# Patient Record
Sex: Female | Born: 1960
Health system: Southern US, Community
[De-identification: ages and names within clinical notes are randomized; demographics above are authoritative.]

## PROBLEM LIST (undated history)

## (undated) ENCOUNTER — Emergency Department (HOSPITAL_COMMUNITY): Discharge status: Absent without leave | Payer: Self-pay

## (undated) DIAGNOSIS — R7303 Prediabetes: Secondary | ICD-10-CM

## (undated) DIAGNOSIS — K429 Umbilical hernia without obstruction or gangrene: Secondary | ICD-10-CM

## (undated) DIAGNOSIS — C189 Malignant neoplasm of colon, unspecified: Secondary | ICD-10-CM

## (undated) DIAGNOSIS — M1990 Unspecified osteoarthritis, unspecified site: Secondary | ICD-10-CM

## (undated) DIAGNOSIS — I10 Essential (primary) hypertension: Secondary | ICD-10-CM

## (undated) DIAGNOSIS — T7840XA Allergy, unspecified, initial encounter: Secondary | ICD-10-CM

## (undated) DIAGNOSIS — K219 Gastro-esophageal reflux disease without esophagitis: Secondary | ICD-10-CM

## (undated) DIAGNOSIS — F32A Depression, unspecified: Secondary | ICD-10-CM

## (undated) DIAGNOSIS — M2040 Other hammer toe(s) (acquired), unspecified foot: Secondary | ICD-10-CM

## (undated) DIAGNOSIS — J189 Pneumonia, unspecified organism: Secondary | ICD-10-CM

## (undated) DIAGNOSIS — G35 Multiple sclerosis: Secondary | ICD-10-CM

## (undated) DIAGNOSIS — R197 Diarrhea, unspecified: Secondary | ICD-10-CM

## (undated) DIAGNOSIS — F329 Major depressive disorder, single episode, unspecified: Secondary | ICD-10-CM

## (undated) DIAGNOSIS — G4700 Insomnia, unspecified: Secondary | ICD-10-CM

## (undated) DIAGNOSIS — M549 Dorsalgia, unspecified: Secondary | ICD-10-CM

## (undated) HISTORY — DX: Depression, unspecified: F32.A

## (undated) HISTORY — PX: GASTRIC BYPASS: SHX52

## (undated) HISTORY — DX: Allergy, unspecified, initial encounter: T78.40XA

## (undated) HISTORY — DX: Other hammer toe(s) (acquired), unspecified foot: M20.40

## (undated) HISTORY — PX: COLON SURGERY: SHX602

## (undated) HISTORY — PX: COLONOSCOPY: SHX174

## (undated) HISTORY — PX: FOOT SURGERY: SHX648

## (undated) HISTORY — PX: ABDOMINAL HYSTERECTOMY: SHX81

## (undated) HISTORY — DX: Major depressive disorder, single episode, unspecified: F32.9

---

## 1997-03-19 DIAGNOSIS — Z85038 Personal history of other malignant neoplasm of large intestine: Secondary | ICD-10-CM | POA: Insufficient documentation

## 1997-07-07 ENCOUNTER — Ambulatory Visit (HOSPITAL_COMMUNITY): Admission: RE | Admit: 1997-07-07 | Discharge: 1997-07-07 | Payer: Self-pay | Admitting: Oncology

## 1997-07-26 ENCOUNTER — Ambulatory Visit (HOSPITAL_COMMUNITY): Admission: RE | Admit: 1997-07-26 | Discharge: 1997-07-26 | Payer: Self-pay | Admitting: Surgery

## 1997-08-24 ENCOUNTER — Other Ambulatory Visit: Admission: RE | Admit: 1997-08-24 | Discharge: 1997-08-24 | Payer: Self-pay | Admitting: Hematology and Oncology

## 1997-08-31 ENCOUNTER — Other Ambulatory Visit: Admission: RE | Admit: 1997-08-31 | Discharge: 1997-08-31 | Payer: Self-pay | Admitting: Hematology and Oncology

## 1997-09-07 ENCOUNTER — Other Ambulatory Visit: Admission: RE | Admit: 1997-09-07 | Discharge: 1997-09-07 | Payer: Self-pay | Admitting: Hematology and Oncology

## 1997-09-14 ENCOUNTER — Other Ambulatory Visit: Admission: RE | Admit: 1997-09-14 | Discharge: 1997-09-14 | Payer: Self-pay | Admitting: Hematology and Oncology

## 1997-09-28 ENCOUNTER — Other Ambulatory Visit: Admission: RE | Admit: 1997-09-28 | Discharge: 1997-09-28 | Payer: Self-pay | Admitting: Hematology and Oncology

## 1998-02-16 ENCOUNTER — Emergency Department (HOSPITAL_COMMUNITY): Admission: EM | Admit: 1998-02-16 | Discharge: 1998-02-16 | Payer: Self-pay | Admitting: Emergency Medicine

## 1998-02-16 ENCOUNTER — Encounter: Payer: Self-pay | Admitting: Emergency Medicine

## 1998-03-08 ENCOUNTER — Ambulatory Visit (HOSPITAL_COMMUNITY): Admission: RE | Admit: 1998-03-08 | Discharge: 1998-03-08 | Payer: Self-pay | Admitting: Hematology and Oncology

## 1998-03-19 DIAGNOSIS — G35 Multiple sclerosis: Secondary | ICD-10-CM

## 1998-03-19 HISTORY — DX: Multiple sclerosis: G35

## 1998-04-18 ENCOUNTER — Ambulatory Visit (HOSPITAL_BASED_OUTPATIENT_CLINIC_OR_DEPARTMENT_OTHER): Admission: RE | Admit: 1998-04-18 | Discharge: 1998-04-18 | Payer: Self-pay | Admitting: Surgery

## 1998-04-20 ENCOUNTER — Ambulatory Visit (HOSPITAL_COMMUNITY): Admission: RE | Admit: 1998-04-20 | Discharge: 1998-04-20 | Payer: Self-pay | Admitting: *Deleted

## 1998-07-25 ENCOUNTER — Ambulatory Visit (HOSPITAL_COMMUNITY): Admission: RE | Admit: 1998-07-25 | Discharge: 1998-07-25 | Payer: Self-pay | Admitting: Oncology

## 1998-08-05 ENCOUNTER — Ambulatory Visit (HOSPITAL_COMMUNITY): Admission: RE | Admit: 1998-08-05 | Discharge: 1998-08-05 | Payer: Self-pay

## 1998-09-09 ENCOUNTER — Ambulatory Visit (HOSPITAL_COMMUNITY): Admission: RE | Admit: 1998-09-09 | Discharge: 1998-09-09 | Payer: Self-pay | Admitting: *Deleted

## 1999-04-21 ENCOUNTER — Ambulatory Visit (HOSPITAL_COMMUNITY): Admission: RE | Admit: 1999-04-21 | Discharge: 1999-04-21 | Payer: Self-pay | Admitting: Hematology and Oncology

## 1999-04-21 ENCOUNTER — Encounter: Payer: Self-pay | Admitting: Hematology and Oncology

## 1999-05-18 ENCOUNTER — Inpatient Hospital Stay (HOSPITAL_COMMUNITY): Admission: RE | Admit: 1999-05-18 | Discharge: 1999-05-21 | Payer: Self-pay | Admitting: General Surgery

## 1999-05-20 ENCOUNTER — Encounter: Payer: Self-pay | Admitting: General Surgery

## 1999-07-19 ENCOUNTER — Ambulatory Visit (HOSPITAL_COMMUNITY): Admission: RE | Admit: 1999-07-19 | Discharge: 1999-07-19 | Payer: Self-pay | Admitting: *Deleted

## 1999-10-18 ENCOUNTER — Ambulatory Visit (HOSPITAL_COMMUNITY): Admission: RE | Admit: 1999-10-18 | Discharge: 1999-10-18 | Payer: Self-pay | Admitting: Hematology and Oncology

## 1999-10-18 ENCOUNTER — Encounter: Payer: Self-pay | Admitting: Hematology and Oncology

## 2000-01-30 ENCOUNTER — Encounter: Payer: Self-pay | Admitting: Emergency Medicine

## 2000-01-30 ENCOUNTER — Emergency Department (HOSPITAL_COMMUNITY): Admission: EM | Admit: 2000-01-30 | Discharge: 2000-01-30 | Payer: Self-pay | Admitting: Emergency Medicine

## 2000-09-25 ENCOUNTER — Ambulatory Visit (HOSPITAL_COMMUNITY): Admission: RE | Admit: 2000-09-25 | Discharge: 2000-09-25 | Payer: Self-pay | Admitting: *Deleted

## 2000-09-25 ENCOUNTER — Encounter (INDEPENDENT_AMBULATORY_CARE_PROVIDER_SITE_OTHER): Payer: Self-pay | Admitting: Specialist

## 2000-11-14 ENCOUNTER — Encounter: Payer: Self-pay | Admitting: Emergency Medicine

## 2000-11-14 ENCOUNTER — Emergency Department (HOSPITAL_COMMUNITY): Admission: EM | Admit: 2000-11-14 | Discharge: 2000-11-14 | Payer: Self-pay | Admitting: Emergency Medicine

## 2001-05-14 ENCOUNTER — Emergency Department (HOSPITAL_COMMUNITY): Admission: EM | Admit: 2001-05-14 | Discharge: 2001-05-14 | Payer: Self-pay | Admitting: Emergency Medicine

## 2001-07-30 ENCOUNTER — Emergency Department (HOSPITAL_COMMUNITY): Admission: EM | Admit: 2001-07-30 | Discharge: 2001-07-30 | Payer: Self-pay | Admitting: Emergency Medicine

## 2001-09-04 ENCOUNTER — Encounter: Payer: Self-pay | Admitting: *Deleted

## 2001-09-04 ENCOUNTER — Ambulatory Visit (HOSPITAL_COMMUNITY): Admission: RE | Admit: 2001-09-04 | Discharge: 2001-09-04 | Payer: Self-pay | Admitting: *Deleted

## 2001-09-11 ENCOUNTER — Ambulatory Visit (HOSPITAL_COMMUNITY): Admission: RE | Admit: 2001-09-11 | Discharge: 2001-09-11 | Payer: Self-pay | Admitting: *Deleted

## 2001-09-11 ENCOUNTER — Encounter: Payer: Self-pay | Admitting: *Deleted

## 2002-03-19 DIAGNOSIS — G35 Multiple sclerosis: Secondary | ICD-10-CM | POA: Insufficient documentation

## 2002-03-26 ENCOUNTER — Encounter: Payer: Self-pay | Admitting: *Deleted

## 2002-03-26 ENCOUNTER — Ambulatory Visit (HOSPITAL_COMMUNITY): Admission: RE | Admit: 2002-03-26 | Discharge: 2002-03-26 | Payer: Self-pay | Admitting: *Deleted

## 2002-03-27 ENCOUNTER — Encounter (INDEPENDENT_AMBULATORY_CARE_PROVIDER_SITE_OTHER): Payer: Self-pay | Admitting: Specialist

## 2002-03-27 ENCOUNTER — Ambulatory Visit (HOSPITAL_COMMUNITY): Admission: RE | Admit: 2002-03-27 | Discharge: 2002-03-27 | Payer: Self-pay | Admitting: *Deleted

## 2002-07-31 ENCOUNTER — Emergency Department (HOSPITAL_COMMUNITY): Admission: EM | Admit: 2002-07-31 | Discharge: 2002-07-31 | Payer: Self-pay | Admitting: Emergency Medicine

## 2002-07-31 ENCOUNTER — Encounter: Payer: Self-pay | Admitting: Emergency Medicine

## 2002-09-30 ENCOUNTER — Ambulatory Visit (HOSPITAL_COMMUNITY): Admission: RE | Admit: 2002-09-30 | Discharge: 2002-09-30 | Payer: Self-pay | Admitting: *Deleted

## 2002-11-19 ENCOUNTER — Ambulatory Visit (HOSPITAL_COMMUNITY): Admission: RE | Admit: 2002-11-19 | Discharge: 2002-11-19 | Payer: Self-pay | Admitting: Neurology

## 2002-11-19 ENCOUNTER — Encounter: Payer: Self-pay | Admitting: Neurology

## 2002-12-23 ENCOUNTER — Ambulatory Visit (HOSPITAL_COMMUNITY): Admission: RE | Admit: 2002-12-23 | Discharge: 2002-12-23 | Payer: Self-pay | Admitting: *Deleted

## 2003-02-23 ENCOUNTER — Ambulatory Visit (HOSPITAL_COMMUNITY): Admission: RE | Admit: 2003-02-23 | Discharge: 2003-02-23 | Payer: Self-pay | Admitting: Neurology

## 2003-07-22 ENCOUNTER — Ambulatory Visit (HOSPITAL_COMMUNITY): Admission: RE | Admit: 2003-07-22 | Discharge: 2003-07-22 | Payer: Self-pay | Admitting: *Deleted

## 2003-08-03 ENCOUNTER — Ambulatory Visit (HOSPITAL_COMMUNITY): Admission: RE | Admit: 2003-08-03 | Discharge: 2003-08-03 | Payer: Self-pay | Admitting: General Practice

## 2003-11-27 ENCOUNTER — Emergency Department (HOSPITAL_COMMUNITY): Admission: EM | Admit: 2003-11-27 | Discharge: 2003-11-27 | Payer: Self-pay | Admitting: Emergency Medicine

## 2003-12-22 ENCOUNTER — Encounter: Admission: RE | Admit: 2003-12-22 | Discharge: 2003-12-22 | Payer: Self-pay | Admitting: Neurology

## 2004-03-29 ENCOUNTER — Emergency Department (HOSPITAL_COMMUNITY): Admission: EM | Admit: 2004-03-29 | Discharge: 2004-03-29 | Payer: Self-pay | Admitting: Emergency Medicine

## 2004-04-21 ENCOUNTER — Emergency Department (HOSPITAL_COMMUNITY): Admission: EM | Admit: 2004-04-21 | Discharge: 2004-04-21 | Payer: Self-pay | Admitting: Emergency Medicine

## 2004-04-27 ENCOUNTER — Encounter: Admission: RE | Admit: 2004-04-27 | Discharge: 2004-07-26 | Payer: Self-pay | Admitting: Neurology

## 2004-05-30 ENCOUNTER — Ambulatory Visit (HOSPITAL_COMMUNITY): Admission: RE | Admit: 2004-05-30 | Discharge: 2004-05-30 | Payer: Self-pay | Admitting: *Deleted

## 2005-03-05 ENCOUNTER — Encounter (INDEPENDENT_AMBULATORY_CARE_PROVIDER_SITE_OTHER): Payer: Self-pay | Admitting: Specialist

## 2005-03-05 ENCOUNTER — Ambulatory Visit (HOSPITAL_COMMUNITY): Admission: RE | Admit: 2005-03-05 | Discharge: 2005-03-05 | Payer: Self-pay | Admitting: *Deleted

## 2005-07-18 ENCOUNTER — Encounter: Payer: Self-pay | Admitting: Hematology and Oncology

## 2005-10-17 ENCOUNTER — Emergency Department (HOSPITAL_COMMUNITY): Admission: EM | Admit: 2005-10-17 | Discharge: 2005-10-17 | Payer: Self-pay | Admitting: Emergency Medicine

## 2006-03-19 DIAGNOSIS — Z9884 Bariatric surgery status: Secondary | ICD-10-CM | POA: Insufficient documentation

## 2006-06-05 ENCOUNTER — Encounter (INDEPENDENT_AMBULATORY_CARE_PROVIDER_SITE_OTHER): Payer: Self-pay | Admitting: Specialist

## 2006-06-05 ENCOUNTER — Ambulatory Visit (HOSPITAL_COMMUNITY): Admission: RE | Admit: 2006-06-05 | Discharge: 2006-06-05 | Payer: Self-pay | Admitting: *Deleted

## 2007-07-14 ENCOUNTER — Emergency Department (HOSPITAL_COMMUNITY): Admission: EM | Admit: 2007-07-14 | Discharge: 2007-07-15 | Payer: Self-pay | Admitting: Emergency Medicine

## 2007-07-17 ENCOUNTER — Emergency Department (HOSPITAL_COMMUNITY): Admission: EM | Admit: 2007-07-17 | Discharge: 2007-07-17 | Payer: Self-pay | Admitting: Emergency Medicine

## 2007-09-04 ENCOUNTER — Encounter (INDEPENDENT_AMBULATORY_CARE_PROVIDER_SITE_OTHER): Payer: Self-pay | Admitting: *Deleted

## 2007-09-04 ENCOUNTER — Ambulatory Visit (HOSPITAL_COMMUNITY): Admission: RE | Admit: 2007-09-04 | Discharge: 2007-09-04 | Payer: Self-pay | Admitting: *Deleted

## 2007-10-09 ENCOUNTER — Encounter: Admission: RE | Admit: 2007-10-09 | Discharge: 2007-10-09 | Payer: Self-pay | Admitting: Internal Medicine

## 2008-01-19 ENCOUNTER — Emergency Department (HOSPITAL_COMMUNITY): Admission: EM | Admit: 2008-01-19 | Discharge: 2008-01-19 | Payer: Self-pay | Admitting: Emergency Medicine

## 2008-05-24 ENCOUNTER — Ambulatory Visit: Payer: Self-pay | Admitting: Nurse Practitioner

## 2008-05-24 DIAGNOSIS — I10 Essential (primary) hypertension: Secondary | ICD-10-CM | POA: Insufficient documentation

## 2008-05-24 DIAGNOSIS — K589 Irritable bowel syndrome without diarrhea: Secondary | ICD-10-CM | POA: Insufficient documentation

## 2008-05-24 DIAGNOSIS — E669 Obesity, unspecified: Secondary | ICD-10-CM | POA: Insufficient documentation

## 2008-05-24 LAB — CONVERTED CEMR LAB
ALT: 14 units/L (ref 0–35)
ANA Titer 1: NEGATIVE
AST: 12 units/L (ref 0–37)
Albumin: 4.4 g/dL (ref 3.5–5.2)
Alkaline Phosphatase: 87 units/L (ref 39–117)
Anti Nuclear Antibody(ANA): POSITIVE — AB
BUN: 15 mg/dL (ref 6–23)
Band Neutrophils: 0 % (ref 0–10)
Basophils Absolute: 0 10*3/uL (ref 0.0–0.1)
Basophils Relative: 0 % (ref 0–1)
CO2: 21 meq/L (ref 19–32)
Calcium: 9.3 mg/dL (ref 8.4–10.5)
Chloride: 106 meq/L (ref 96–112)
Creatinine, Ser: 0.7 mg/dL (ref 0.40–1.20)
Eosinophils Absolute: 0.1 10*3/uL (ref 0.0–0.7)
Eosinophils Relative: 2 % (ref 0–5)
Glucose, Bld: 95 mg/dL (ref 70–99)
HCT: 40.3 % (ref 36.0–46.0)
Hemoglobin: 12.6 g/dL (ref 12.0–15.0)
Lymphocytes Relative: 24 % (ref 12–46)
Lymphs Abs: 1.8 10*3/uL (ref 0.7–4.0)
MCHC: 31.3 g/dL (ref 30.0–36.0)
MCV: 96 fL (ref 78.0–100.0)
Monocytes Absolute: 0.4 10*3/uL (ref 0.1–1.0)
Monocytes Relative: 6 % (ref 3–12)
Neutro Abs: 5.2 10*3/uL (ref 1.7–7.7)
Neutrophils Relative %: 68 % (ref 43–77)
Platelets: 314 10*3/uL (ref 150–400)
Potassium: 4.5 meq/L (ref 3.5–5.3)
RBC: 4.2 M/uL (ref 3.87–5.11)
RDW: 14 % (ref 11.5–15.5)
Sed Rate: 12 mm/hr (ref 0–22)
Sodium: 137 meq/L (ref 135–145)
TSH: 0.417 microintl units/mL (ref 0.350–4.500)
Total Bilirubin: 0.5 mg/dL (ref 0.3–1.2)
Total Protein: 7.7 g/dL (ref 6.0–8.3)
WBC: 7.6 10*3/uL (ref 4.0–10.5)

## 2008-05-25 ENCOUNTER — Encounter (INDEPENDENT_AMBULATORY_CARE_PROVIDER_SITE_OTHER): Payer: Self-pay | Admitting: Nurse Practitioner

## 2008-05-27 ENCOUNTER — Encounter (INDEPENDENT_AMBULATORY_CARE_PROVIDER_SITE_OTHER): Payer: Self-pay | Admitting: Nurse Practitioner

## 2008-06-01 ENCOUNTER — Telehealth (INDEPENDENT_AMBULATORY_CARE_PROVIDER_SITE_OTHER): Payer: Self-pay | Admitting: Nurse Practitioner

## 2008-06-11 ENCOUNTER — Encounter (INDEPENDENT_AMBULATORY_CARE_PROVIDER_SITE_OTHER): Payer: Self-pay | Admitting: Nurse Practitioner

## 2008-06-28 ENCOUNTER — Ambulatory Visit: Payer: Self-pay | Admitting: Nurse Practitioner

## 2008-06-28 DIAGNOSIS — M1711 Unilateral primary osteoarthritis, right knee: Secondary | ICD-10-CM | POA: Insufficient documentation

## 2008-06-28 DIAGNOSIS — M1710 Unilateral primary osteoarthritis, unspecified knee: Secondary | ICD-10-CM | POA: Insufficient documentation

## 2008-06-28 DIAGNOSIS — K2270 Barrett's esophagus without dysplasia: Secondary | ICD-10-CM | POA: Insufficient documentation

## 2008-06-29 ENCOUNTER — Ambulatory Visit (HOSPITAL_COMMUNITY): Admission: RE | Admit: 2008-06-29 | Discharge: 2008-06-29 | Payer: Self-pay | Admitting: Nurse Practitioner

## 2008-06-30 ENCOUNTER — Ambulatory Visit: Payer: Self-pay | Admitting: Nurse Practitioner

## 2008-06-30 DIAGNOSIS — S83249A Other tear of medial meniscus, current injury, unspecified knee, initial encounter: Secondary | ICD-10-CM | POA: Insufficient documentation

## 2008-06-30 DIAGNOSIS — IMO0002 Reserved for concepts with insufficient information to code with codable children: Secondary | ICD-10-CM | POA: Insufficient documentation

## 2008-07-02 ENCOUNTER — Encounter (INDEPENDENT_AMBULATORY_CARE_PROVIDER_SITE_OTHER): Payer: Self-pay | Admitting: Nurse Practitioner

## 2008-07-05 ENCOUNTER — Ambulatory Visit (HOSPITAL_COMMUNITY): Admission: RE | Admit: 2008-07-05 | Discharge: 2008-07-05 | Payer: Self-pay | Admitting: Family Medicine

## 2008-07-14 ENCOUNTER — Encounter (INDEPENDENT_AMBULATORY_CARE_PROVIDER_SITE_OTHER): Payer: Self-pay | Admitting: Nurse Practitioner

## 2008-07-16 ENCOUNTER — Encounter (INDEPENDENT_AMBULATORY_CARE_PROVIDER_SITE_OTHER): Payer: Self-pay | Admitting: Nurse Practitioner

## 2008-07-27 ENCOUNTER — Telehealth (INDEPENDENT_AMBULATORY_CARE_PROVIDER_SITE_OTHER): Payer: Self-pay | Admitting: Nurse Practitioner

## 2008-07-28 ENCOUNTER — Encounter (INDEPENDENT_AMBULATORY_CARE_PROVIDER_SITE_OTHER): Payer: Self-pay | Admitting: Nurse Practitioner

## 2008-08-23 ENCOUNTER — Telehealth (INDEPENDENT_AMBULATORY_CARE_PROVIDER_SITE_OTHER): Payer: Self-pay | Admitting: Nurse Practitioner

## 2008-09-06 ENCOUNTER — Encounter (INDEPENDENT_AMBULATORY_CARE_PROVIDER_SITE_OTHER): Payer: Self-pay | Admitting: *Deleted

## 2008-10-01 ENCOUNTER — Encounter (INDEPENDENT_AMBULATORY_CARE_PROVIDER_SITE_OTHER): Payer: Self-pay | Admitting: Nurse Practitioner

## 2008-11-09 ENCOUNTER — Emergency Department (HOSPITAL_COMMUNITY): Admission: EM | Admit: 2008-11-09 | Discharge: 2008-11-09 | Payer: Self-pay | Admitting: Family Medicine

## 2008-11-09 ENCOUNTER — Telehealth (INDEPENDENT_AMBULATORY_CARE_PROVIDER_SITE_OTHER): Payer: Self-pay | Admitting: Nurse Practitioner

## 2008-11-19 ENCOUNTER — Telehealth (INDEPENDENT_AMBULATORY_CARE_PROVIDER_SITE_OTHER): Payer: Self-pay | Admitting: Nurse Practitioner

## 2008-11-29 ENCOUNTER — Ambulatory Visit: Payer: Self-pay | Admitting: Nurse Practitioner

## 2008-11-29 DIAGNOSIS — M545 Low back pain, unspecified: Secondary | ICD-10-CM | POA: Insufficient documentation

## 2008-11-29 DIAGNOSIS — M5410 Radiculopathy, site unspecified: Secondary | ICD-10-CM

## 2008-12-09 ENCOUNTER — Telehealth (INDEPENDENT_AMBULATORY_CARE_PROVIDER_SITE_OTHER): Payer: Self-pay | Admitting: Nurse Practitioner

## 2008-12-10 ENCOUNTER — Encounter (INDEPENDENT_AMBULATORY_CARE_PROVIDER_SITE_OTHER): Payer: Self-pay | Admitting: Nurse Practitioner

## 2008-12-16 ENCOUNTER — Telehealth (INDEPENDENT_AMBULATORY_CARE_PROVIDER_SITE_OTHER): Payer: Self-pay | Admitting: Nurse Practitioner

## 2009-02-15 ENCOUNTER — Telehealth (INDEPENDENT_AMBULATORY_CARE_PROVIDER_SITE_OTHER): Payer: Self-pay | Admitting: Nurse Practitioner

## 2009-02-17 ENCOUNTER — Encounter (INDEPENDENT_AMBULATORY_CARE_PROVIDER_SITE_OTHER): Payer: Self-pay | Admitting: Nurse Practitioner

## 2009-03-04 ENCOUNTER — Encounter (INDEPENDENT_AMBULATORY_CARE_PROVIDER_SITE_OTHER): Payer: Self-pay | Admitting: Nurse Practitioner

## 2009-03-24 ENCOUNTER — Emergency Department (HOSPITAL_COMMUNITY): Admission: EM | Admit: 2009-03-24 | Discharge: 2009-03-24 | Payer: Self-pay | Admitting: Family Medicine

## 2009-05-27 ENCOUNTER — Encounter (INDEPENDENT_AMBULATORY_CARE_PROVIDER_SITE_OTHER): Payer: Self-pay | Admitting: Nurse Practitioner

## 2009-05-30 ENCOUNTER — Encounter (INDEPENDENT_AMBULATORY_CARE_PROVIDER_SITE_OTHER): Payer: Self-pay | Admitting: Nurse Practitioner

## 2009-06-15 ENCOUNTER — Emergency Department (HOSPITAL_COMMUNITY): Admission: EM | Admit: 2009-06-15 | Discharge: 2009-06-15 | Payer: Self-pay | Admitting: Family Medicine

## 2009-06-16 ENCOUNTER — Ambulatory Visit: Payer: Self-pay | Admitting: Nurse Practitioner

## 2009-06-16 DIAGNOSIS — IMO0002 Reserved for concepts with insufficient information to code with codable children: Secondary | ICD-10-CM | POA: Insufficient documentation

## 2009-06-17 LAB — CONVERTED CEMR LAB
Basophils Absolute: 0 10*3/uL (ref 0.0–0.1)
Basophils Relative: 1 % (ref 0–1)
Eosinophils Absolute: 0.1 10*3/uL (ref 0.0–0.7)
Eosinophils Relative: 2 % (ref 0–5)
HCT: 36.7 % (ref 36.0–46.0)
Hemoglobin: 12 g/dL (ref 12.0–15.0)
Lymphocytes Relative: 25 % (ref 12–46)
Lymphs Abs: 1.6 10*3/uL (ref 0.7–4.0)
MCHC: 32.7 g/dL (ref 30.0–36.0)
MCV: 100.3 fL — ABNORMAL HIGH (ref 78.0–100.0)
Monocytes Absolute: 0.4 10*3/uL (ref 0.1–1.0)
Monocytes Relative: 6 % (ref 3–12)
Neutro Abs: 4.2 10*3/uL (ref 1.7–7.7)
Neutrophils Relative %: 66 % (ref 43–77)
Platelets: 274 10*3/uL (ref 150–400)
RBC: 3.66 M/uL — ABNORMAL LOW (ref 3.87–5.11)
RDW: 14.7 % (ref 11.5–15.5)
Sed Rate: 3 mm/hr (ref 0–22)
WBC: 6.3 10*3/uL (ref 4.0–10.5)

## 2009-08-25 ENCOUNTER — Telehealth (INDEPENDENT_AMBULATORY_CARE_PROVIDER_SITE_OTHER): Payer: Self-pay | Admitting: Nurse Practitioner

## 2009-10-27 ENCOUNTER — Telehealth (INDEPENDENT_AMBULATORY_CARE_PROVIDER_SITE_OTHER): Payer: Self-pay | Admitting: Nurse Practitioner

## 2009-10-27 ENCOUNTER — Emergency Department (HOSPITAL_COMMUNITY): Admission: EM | Admit: 2009-10-27 | Discharge: 2009-10-27 | Payer: Self-pay | Admitting: Adult Health

## 2009-10-28 ENCOUNTER — Telehealth (INDEPENDENT_AMBULATORY_CARE_PROVIDER_SITE_OTHER): Payer: Self-pay | Admitting: Nurse Practitioner

## 2009-12-27 ENCOUNTER — Telehealth (INDEPENDENT_AMBULATORY_CARE_PROVIDER_SITE_OTHER): Payer: Self-pay | Admitting: Nurse Practitioner

## 2010-04-08 ENCOUNTER — Encounter: Payer: Self-pay | Admitting: Emergency Medicine

## 2010-04-09 ENCOUNTER — Encounter: Payer: Self-pay | Admitting: Obstetrics

## 2010-04-09 ENCOUNTER — Encounter: Payer: Self-pay | Admitting: *Deleted

## 2010-04-09 ENCOUNTER — Encounter: Payer: Self-pay | Admitting: Neurology

## 2010-04-09 ENCOUNTER — Encounter: Payer: Self-pay | Admitting: Emergency Medicine

## 2010-04-13 ENCOUNTER — Telehealth (INDEPENDENT_AMBULATORY_CARE_PROVIDER_SITE_OTHER): Payer: Self-pay | Admitting: Nurse Practitioner

## 2010-04-14 ENCOUNTER — Ambulatory Visit
Admission: RE | Admit: 2010-04-14 | Discharge: 2010-04-14 | Payer: Self-pay | Source: Home / Self Care | Attending: Nurse Practitioner | Admitting: Nurse Practitioner

## 2010-04-14 ENCOUNTER — Encounter (INDEPENDENT_AMBULATORY_CARE_PROVIDER_SITE_OTHER): Payer: Self-pay | Admitting: Nurse Practitioner

## 2010-04-14 DIAGNOSIS — R42 Dizziness and giddiness: Secondary | ICD-10-CM | POA: Insufficient documentation

## 2010-04-14 DIAGNOSIS — N3000 Acute cystitis without hematuria: Secondary | ICD-10-CM | POA: Insufficient documentation

## 2010-04-14 DIAGNOSIS — R5383 Other fatigue: Secondary | ICD-10-CM | POA: Insufficient documentation

## 2010-04-14 LAB — CONVERTED CEMR LAB
Blood in Urine, dipstick: NEGATIVE
Glucose, Urine, Semiquant: NEGATIVE
Ketones, urine, test strip: NEGATIVE
Nitrite: POSITIVE
Protein, U semiquant: NEGATIVE
Specific Gravity, Urine: 1.025
Urobilinogen, UA: 1
WBC Urine, dipstick: NEGATIVE
pH: 5

## 2010-04-17 ENCOUNTER — Telehealth (INDEPENDENT_AMBULATORY_CARE_PROVIDER_SITE_OTHER): Payer: Self-pay | Admitting: Nurse Practitioner

## 2010-04-18 ENCOUNTER — Telehealth (INDEPENDENT_AMBULATORY_CARE_PROVIDER_SITE_OTHER): Payer: Self-pay | Admitting: Nurse Practitioner

## 2010-04-18 ENCOUNTER — Encounter (INDEPENDENT_AMBULATORY_CARE_PROVIDER_SITE_OTHER): Payer: Self-pay | Admitting: Nurse Practitioner

## 2010-04-18 DIAGNOSIS — E559 Vitamin D deficiency, unspecified: Secondary | ICD-10-CM | POA: Insufficient documentation

## 2010-04-18 NOTE — Miscellaneous (Signed)
Summary: Med clarification  Clinical Lists Changes  Medications: Changed medication from TRAMADOL HCL 50 MG TABS (TRAMADOL HCL) Take 1 tablet by mouth as needed for pain to TRAMADOL HCL 50 MG TABS (TRAMADOL HCL) Take 1 tablet by mouth daily as needed for pain

## 2010-04-18 NOTE — Letter (Signed)
Summary: [facility]ERR[facility]PT DATE &TIME  [facility]ERR[facility]PT DATE &TIME   Imported By: Arta Bruce 12/10/2008 11:32:32  _____________________________________________________________________  External Attachment:    Type:   Image     Comment:   External Document

## 2010-04-18 NOTE — Miscellaneous (Signed)
Summary: Hx - Prompt Med at [facility]  Clinical Lists Changes Full history and records available in historical file

## 2010-04-18 NOTE — Assessment & Plan Note (Signed)
Summary: Acute - RLQ Abd pain   Vital Signs:  Patient profile:   50 year old female Menstrual status:  partial hysterectomy Weight:      244.5 pounds BMI:     40.83 BSA:     2.16 Temp:     97.9 degrees F oral Pulse rate:   66 / minute Pulse rhythm:   regular Resp:     16 per minute BP sitting:   124 / 74  (left arm) Cuff size:   regular  Vitals Entered By: Levon Hedger (June 16, 2009 4:29 PM) CC: pain in left side and lower x 3 days went to urgent care last night Is Patient Diabetic? No Pain Assessment Patient in pain? yes     Location: back Intensity: 4  Does patient need assistance? Functional Status Self care Ambulation Normal   CC:  pain in left side and lower x 3 days went to urgent care last night.  History of Present Illness:  Pt into the office with complaints of right lower quadrant  tenderness  Notes that 2 nights ago at work she had to lift more than usual at her job. The next day she developed pain (yesterday) in her RLQ and lower back. She also reports that over the past weekend she has intercourse 4 times on 4 different days which is more than her usual. She went to urgent care on last night (provider called this office today indicating that pt came to Urgent care near closing time and complete workup was not done however pt did refuse the abd ct that was recommended) u/a negative  Pseudo tumor - pt has been going to [facility] neurology.  Still with headaches  ? if she has MS.  She needs additional testing but is unable to afford.  Right knee - inflammed.  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 no     Have you felt little pleasure in things? no     Depression Counseling: not indicated; screening negative for depression     Drug Use: no     Seat Belt Use: 100     Sun Exposure: occasionally  Allergies (verified): No Known Drug Allergies  Social History: Does Patient Exercise:  no  Review of Systems General:  Denies fever. CV:  Denies chest pain or discomfort. Resp:  Denies cough. GI:  Complains of abdominal pain; denies nausea and vomiting. MS:  Complains of low back pain.  Physical Exam  General:  alert.  obese Head:  normocephalic.   Lungs:  normal breath sounds.   Heart:  normal rate and regular rhythm.   Abdomen:  tenderness with palpation of right femoral ligament normal bowel sounds.   no rebound tenderness mildly tender in right lower quad Msk:  active ROM in bil lower ext   Impression & Recommendations:  Problem # 1:  INGUINAL PAIN, RIGHT (ICD-789.09) Symptoms most likely due to sprained ligament/muscle advised pt to apply heat  to the affected area prednisone taper will check cbc Her updated medication list for this problem includes:    Tramadol Hcl 50 Mg Tabs (Tramadol hcl) ..... One tablet by mouth two times a day as needed for pain    Ibuprofen 600 Mg Tabs (Ibuprofen) ..... One tablet by mouth three times a day as needed for pain    Cyclobenzaprine Hcl 10 Mg Tabs (Cyclobenzaprine hcl) ..... One tablet by mouth nightly as needed for muscles  Orders: T-CBC w/Diff ([id number]) T-Sed Rate (Automated) ([id number])  Complete Medication List: 1)  Furosemide 20 Mg Tabs (Furosemide) .... One tablet by mouth daily as needed for fluid 2)  Tramadol Hcl 50 Mg Tabs (Tramadol hcl) .... One tablet by mouth two times a day as needed for pain 3)  Provigil 200 Mg Tabs (Modafinil) .... **rx by neurology** 4)  Klor-con M20 20 Meq Cr-tabs (Potassium chloride  crys cr) .Marland Kitchen.. 1 tablet by mouth daily 5)  Copaxone 20 Mg/ml Kit (Glatiramer acetate) 6)  Dicyclomine Hcl 20 Mg Tabs (Dicyclomine hcl) .... Take 1 tablet by mouth three times a day 7)  Ondansetron Hcl 4 Mg Tabs (Ondansetron hcl) .... Take 1 tablet by mouth every 8 hours as needed for nausea 8)  Lisinopril-hydrochlorothiazide 10-12.5 Mg Tabs (Lisinopril-hydrochlorothiazide) .... One tablet by mouth daily 9)  Acetazolamide 250 Mg Tabs (Acetazolamide) .... Take one tablet by mouth three times a day  **rx by neurology** 10)  Ibuprofen 600 Mg Tabs (Ibuprofen) .... One tablet by mouth three times a day as needed for pain 11)  Nexium 40 Mg Cpdr (Esomeprazole magnesium) .... One tablet by mouth daily for stomach before breakfast 12)  Cyclobenzaprine Hcl 10 Mg Tabs (Cyclobenzaprine hcl) .... One tablet by mouth nightly as needed for muscles 13)  Lidoderm 5 % Ptch (Lidocaine) .... Apply for 12 hours topically to affected then off for 12 hours 14)  Prednisone 10 Mg Tabs (Prednisone) .... 30mg  x 2 days, 20mg  x 2 days, 10mg   x 2 days by mouth daily 15)  Loratadine 10 Mg Tabs (Loratadine) .... One tablet by mouth daily as needed for allergies  Patient Instructions: 1)  Your blood count will be checked today to make sure your white count is not high.  A normal white count will mean this is not appendicitis. 2)  Urine was checked in the Emergency room and it was fine 3)  Pain is likely from a pulled ligament in your inguinal area (in the fold of your right thigh) 4)  Apply warm compress or hot towels. 5)  Take prednisone taper 6)  30mg  x 2 days (Thursday and friday) 7)  20mg  x 2 days (Saturday and Sunday) 8)  10mg  x 2 days (Monday and Tuesday) 9)  If pain continues or worsens next week then notify this office. Prescriptions: LORATADINE 10 MG TABS (LORATADINE) One tablet by mouth daily as needed for allergies  #30 x 3   Entered and Authorized by:   Lehman Prom FNP   Signed by:   Lehman Prom FNP on  06/16/2009   Method used:   Print then Give to Patient   RxID:   [id number] ONDANSETRON HCL 4 MG TABS (ONDANSETRON HCL) Take 1 tablet by mouth every 8 hours as needed for nausea  #30 x 0   Entered and Authorized by:   Lehman Prom FNP   Signed by:   Lehman Prom FNP on 06/16/2009   Method used:   Faxed to .Marland KitchenMar[facility] Kitchen  [facility] (retail)       [address] [place], [place]  [address]       Ph: [phone]       Fax: [phone]   RxID:   [id number] KLOR-CON M20 20 MEQ CR-TABS (POTASSIUM CHLORIDE CRYS CR) 1 tablet by mouth daily  #30 x 5   Entered and Authorized by:   Lehman Prom FNP   Signed by:   Lehman Prom FNP on 06/16/2009   Method used:   Faxed to ...       [facility] - [facility] (retail)       [address] [place], [place]  [address]       Ph: [phone]       Fax: [phone]   RxID:   [id number] CYCLOBENZAPRINE HCL 10 MG TABS (CYCLOBENZAPRINE HCL) One tablet by mouth nightly as needed for muscles  #30 x 0   Entered and Authorized by:   Lehman Prom FNP   Signed by:   Lehman Prom FNP on 06/16/2009   Method used:   Print then Give to Patient   RxID:   [id number] TRAMADOL HCL 50 MG TABS (TRAMADOL HCL) One tablet by mouth two times a day as needed for pain  #60 x 0   Entered and Authorized by:   Lehman Prom FNP   Signed by:   Lehman Prom FNP on 06/16/2009   Method used:   Print then Give to Patient   RxID:   [id number] LISINOPRIL-HYDROCHLOROTHIAZIDE 10-12.5 MG TABS (LISINOPRIL-HYDROCHLOROTHIAZIDE) One tablet by mouth daily  #30 x 5   Entered and Authorized by:   Lehman Prom FNP   Signed by:   Lehman Prom FNP on 06/16/2009   Method used:   Print then Give to Patient   RxID:   [id number] FUROSEMIDE 20 MG TABS (FUROSEMIDE) One tablet by mouth daily as needed for fluid  #30 x 5   Entered and Authorized by:   Lehman Prom  FNP   Signed by:   Lehman Prom FNP on 06/16/2009   Method used:   Print then Give to Patient   RxID:   [id number]   Handout requested. PREDNISONE 10 MG TABS (PREDNISONE) 30mg  x 2 days, 20mg  x 2 days, 10mg   x 2 days by mouth daily  #12 x 0   Entered and Authorized by:   Lehman Prom FNP   Signed by:   Lehman Prom FNP on 06/16/2009   Method used:   Print then Give to Patient   RxID:   [id number]

## 2010-04-18 NOTE — Progress Notes (Signed)
Summary: FYI  Phone Note From Other Clinic Call back at [facility] Phone [phone]   Summary of Call: [facility] called in today to let us know that they schedule a new appointment for the above on December 2nd at 7:45 am but the pt should be there at 7:30 am.  Pt should go to the 4th floor in the [facility] (Department of Neurology) with Dr Thad Ranger.  They are going to send a new package to the patient.  If you have any further questions, you can call in back at [phone] [facility] FNP Initial call taken by: Manon Hilding,  December 09, 2008 2:41 PM  Follow-up for Phone Call        forward to N. Daphine Deutscher, FNP [facility]) Follow-up by: Levon Hedger,  December 10, 2008 4:55 PM  Additional Follow-up for Phone Call Additional follow up Details #1::        I'm glad that pt has her appt.  She needs to be sure to keep it Additional Follow-up by: Lehman Prom FNP,  December 13, 2008 8:35 AM

## 2010-04-18 NOTE — Letter (Signed)
Summary: [name]  [name]   Imported By: Arta Bruce 07/14/2008 11:29:52  _____________________________________________________________________  External Attachment:    Type:   Image     Comment:   External Document

## 2010-04-18 NOTE — Progress Notes (Signed)
Summary: NEEDS FLAGYL/BP MEDS.  Phone Note Call from Patient Call back at Home Phone [phone]   Reason for Call: Refill Medication Summary of Call: Misty [name] PT. Misty [name] WANTS TO KNOW IF SHE CAN GET SOMETHING FOR A YEAST INFECTION. SHE SAYS THIS IS THE THIRD DAY. Misty [name] SAYS THAT HER GASTRO DR. WHO HAS HER ON THE ANTIBIOTIC (DR. ORR) AFTER TAKING THE CHEMO, WILL NOT CALL IN ANYTHING FOR HER.  AND SHE NEEDS A REFILL ON HER LISINOPRIL THAT WAS PRESCRIBED BY DR. DAVIS. Misty [name] SAYS THAT SHE HAS TRIED MONISTAT AND IT DIDN'T HELP, BUT IS REQUESTING FLAGYL.  [facility] [address]. Initial call taken by: Leodis Rains,  Jul 27, 2008 11:56 AM  Follow-up for Phone Call        forward to N.Daphine Deutscher, FNP Follow-up by: Levon Hedger,  Jul 27, 2008 1:03 PM  Additional Follow-up for Phone Call Additional follow up Details #1::        Clarification - if pt has a yeast infection then she does not need flagyl.  Flagyl is an antibiotic. Will send fluconazole 150mg  by mouth x 1 dose to pharmacy. will also refill bp meds. if symptoms persist after taking fluconazole then she will need nurse visit for wet prep Additional Follow-up by: Lehman Prom FNP,  Jul 27, 2008 2:26 PM    Additional Follow-up for Phone Call Additional follow up Details #2::    Pt informed.   Follow-up by: Levon Hedger,  Jul 27, 2008 4:15 PM  New/Updated Medications: FLUCONAZOLE 150 MG TABS (FLUCONAZOLE) 1 tablet by mouth x 1 dose   Prescriptions: FLUCONAZOLE 150 MG TABS (FLUCONAZOLE) 1 tablet by mouth x 1 dose  #1 x 0   Entered and Authorized by:   Lehman Prom FNP   Signed by:   Lehman Prom FNP on 07/27/2008   Method used:   Electronically to        [facility]  [phone]* (retail)       [address]       [place], [place]  [address]       Ph: [phone] or [phone]       Fax: [phone]   RxID:   [id number] LISINOPRIL-HYDROCHLOROTHIAZIDE  10-12.5 MG TABS (LISINOPRIL-HYDROCHLOROTHIAZIDE) One tablet by mouth daily  #30 x 6   Entered and Authorized by:   Lehman Prom FNP   Signed by:   Lehman Prom FNP on 07/27/2008   Method used:   Electronically to        [facility]  [phone]* (retail)       [address]       [place], [place]  [address]       Ph: [phone] or [phone]       Fax: [phone]   RxID:   [id number]

## 2010-04-18 NOTE — Letter (Signed)
Summary: REQUESTING RECORDS FROM [facility]  REQUESTING RECORDS FROM [facility]   Imported By: Arta Bruce 05/27/2008 11:18:56  _____________________________________________________________________  External Attachment:    Type:   Image     Comment:   External Document

## 2010-04-18 NOTE — Assessment & Plan Note (Signed)
Summary: Back Pain   Vital Signs:  Patient profile:   50 year old female Menstrual status:  partial hysterectomy Weight:      264 pounds BMI:     44.09 BSA:     2.23 Temp:     97.8 degrees F oral Pulse rate:   90 / minute Pulse rhythm:   regular Resp:     20 per minute BP sitting:   144 / 92  (left arm) Cuff size:   regular  Vitals Entered By: Levon Hedger (November 29, 2008 3:56 PM) CC: back pain, Hypertension Management Is Patient Diabetic? No Pain Assessment Patient in pain? yes     Location: back Intensity: 6  Does patient need assistance? Functional Status Self care Ambulation Normal LMP - Character: partial hysterectomy     Menstrual Status partial hysterectomy   CC:  back pain and Hypertension Management.  History of Present Illness:  Pt into the office for f/u on back pain. Seen in ER. Right sided pain when she sent to the ER now she states that pain has gone down to her tailbone  Social - Pt is not employed at [employer]. She is employed on an Theatre stage manager.   She describes that she is putting tops on a the items and then she has to lift the boxes that weigh 35-40 pounds.   She now has shifted her position due to pain and she is now doing most of her work in a stationary position.  She is cutting items for most of the day.  Denies any URI Denies any trauma Difficulty getting out of bed in the morning. Right leg radiculopathy  MS - pt has a hx of MS but she has not been able to go back to the neurologist due to finances. Her last visit was $280.  She has 1 month left of medications.  Hypertension History:      She denies headache, chest pain, and palpitations.        Positive major cardiovascular risk factors include hypertension and family history for ischemic heart disease (females less than 72 years old).  Negative major cardiovascular risk factors include female age less than 41 years old and non-tobacco-user status.        Further assessment for  target organ damage reveals no history of ASHD, cardiac end-organ damage (CHF/LVH), stroke/TIA, peripheral vascular disease, renal insufficiency, or hypertensive retinopathy.     Allergies (verified): No Known Drug Allergies  Review of Systems General:  Complains of fatigue. CV:  Denies chest pain or discomfort. Resp:  Denies cough. GI:  Denies abdominal pain, nausea, and vomiting. MS:  Complains of low back pain; right low back pain with radiculpathy.  Physical Exam  General:  alert.  obese Head:  normocephalic.   Eyes:  pupils equal and pupils round.   Ears:  R ear normal and L ear normal.   Lungs:  normal breath sounds.   Heart:  normal rate and regular rhythm.   Abdomen:  soft, non-tender, and normal bowel sounds.   Msk:  up to the exam table tenderness with palpation of left   Detailed Back/Spine Exam  Lumbosacral Exam:  Inspection-deformity:    Abnormal Palpation-spinal tenderness:  Abnormal    Location:  L4-L5 Sitting Straight Leg Raise:    Right:  negative    Left:  negative   Impression & Recommendations:  Problem # 1:  BACK PAIN WITH RADICULOPATHY (ICD-729.2) Advised pt this is likely muscle in origin will order lidoderm  patch to apply to affected area if pain continues will order MRI  Problem # 2:  HYPERTENSION, BENIGN ESSENTIAL (ICD-401.1) BP is elevated Her updated medication list for this problem includes:    Furosemide 20 Mg Tabs (Furosemide) ..... One tablet by mouth daily as needed for fluid    Lisinopril-hydrochlorothiazide 10-12.5 Mg Tabs (Lisinopril-hydrochlorothiazide) ..... One tablet by mouth daily    Acetazolamide 250 Mg Tabs (Acetazolamide) .Marland Kitchen... Take one tablet by mouth three times a day  **rx by neurology**  Complete Medication List: 1)  Furosemide 20 Mg Tabs (Furosemide) .... One tablet by mouth daily as needed for fluid 2)  Tramadol Hcl 50 Mg Tabs (Tramadol hcl) .... Take 1 tablet by mouth daily as needed for pain 3)  Provigil 200 Mg  Tabs (Modafinil) .... **rx by neurology** 4)  Klor-con M20 20 Meq Cr-tabs (Potassium chloride crys cr) .Marland Kitchen.. 1 tablet by mouth daily 5)  Copaxone 20 Mg/ml Kit (Glatiramer acetate) 6)  Dicyclomine Hcl 20 Mg Tabs (Dicyclomine hcl) .... Take 1 tablet by mouth three times a day 7)  Ondansetron Hcl 4 Mg Tabs (Ondansetron hcl) 8)  Lisinopril-hydrochlorothiazide 10-12.5 Mg Tabs (Lisinopril-hydrochlorothiazide) .... One tablet by mouth daily 9)  Acetazolamide 250 Mg Tabs (Acetazolamide) .... Take one tablet by mouth three times a day  **rx by neurology** 10)  Ibuprofen 600 Mg Tabs (Ibuprofen) .... One tablet by mouth three times a day as needed for pain 11)  Nexium 40 Mg Cpdr (Esomeprazole magnesium) .... One tablet by mouth daily for stomach before breakfast 12)  Lortab 5 5-500 Mg Tabs (Hydrocodone-acetaminophen) .... One tablet by mouth two times a day as needed for pain 13)  Cyclobenzaprine Hcl 10 Mg Tabs (Cyclobenzaprine hcl) .... One tablet by mouth nightly as needed for muscles 14)  Lidoderm 5 % Ptch (Lidocaine) .... Apply for 12 hours topically to affected then off for 12 hours  Other Orders: Neurology Referral (Neuro)  Hypertension Assessment/Plan:      The patient's hypertensive risk group is category B: At least one risk factor (excluding diabetes) with no target organ damage.  Today's blood pressure is 144/92.  Her blood pressure goal is < 140/90.  Patient Instructions: 1)  Continue to use the back brace for support. 2)  Wear the lidoderm - 12 hours on and then 12 hours off to prevent skin irriation 3)  You will be referred to neurology [facility] 4)  Follow up as needed. Prescriptions: LIDODERM 5 % PTCH (LIDOCAINE) Apply for 12 hours topically to affected then off for 12 hours  #30 x 0   Entered and Authorized by:   Lehman Prom FNP   Signed by:   Lehman Prom FNP on 11/29/2008   Method used:   Faxed to ...       [facility] (retail)       [address] [place], [place]  [address]       Ph: [phone]       Fax: [phone]   RxID:   [id number] LIDODERM 5 % PTCH (LIDOCAINE) Apply for 12 hours topically to affected then off for 12 hours  #30 x 0   Entered and Authorized by:   Lehman Prom FNP   Signed by:   Lehman Prom FNP on 11/29/2008   Method used:   Print then Give to Patient   RxID:   [id number]

## 2010-04-18 NOTE — Assessment & Plan Note (Signed)
Summary: Knee pain   Vital Signs:  Patient profile:   50 year old female Height:      65 inches Weight:      266 pounds Temp:     97.7 degrees F oral Pulse rate:   80 / minute Pulse rhythm:   regular Resp:     20 per minute BP sitting:   120 / 70  (left arm) Cuff size:   regular  Vitals Entered By: Levon Hedger (June 28, 2008 2:32 PM) CC: follow-up visit/ tb skin test, Hypertension Management Is Patient Diabetic? No Pain Assessment Patient in pain? yes     Location: left leg Intensity: 7  Does patient need assistance? Functional Status Self care Ambulation Normal   History of Present Illness:  Pt into the office for f/u. She was seen in office last month.  Pt was re-referred back to [facility].  She was seen in that office on yesturday. Medications were adjusted by that office. Pt does have a psuedotumor and some intracranial pressure. She was told to discontinue the lortab and start tramadol and zanaflex. She needs to call back in August for an appointment.  Barrett's esophagus - pt presents with a letter from Dr. Virginia Rochester from [facility]. She last had EGD done 2 years ago.  Bil knee pain - left worse than right. extreme pain in the left knee she does have 17 stairs in her house. s/p right arthroscopy.  She has tried to wear the knee immobilizer on the left leg but the pain continued. she has tried icy hot, ibuprofen. she has alternated heat and cold.  No weight bearing.  Pt needs a TB skin test.  Hypertension History:      She complains of headache, but denies chest pain and palpitations.  She notes no problems with any antihypertensive medication side effects.        Positive major cardiovascular risk factors include hypertension and family history for ischemic heart disease (females less than 10 years old).  Negative major cardiovascular risk factors include female age less than 55 years old and non-tobacco-user status.      Medications Prior to Update: 1)  Furosemide 20 Mg Tabs (Furosemide) .... One Tablet By Mouth Daily As Needed For Fluid 2)  Tramadol Hcl 50 Mg Tabs (Tramadol Hcl) .... Take 1 Tablet By Mouth As Needed For Pain 3)  Provigil 200 Mg Tabs (Modafinil) .... **rx By Neurology** 4)  Klor-Con M20 20 Meq Cr-Tabs (Potassium Chloride Crys Cr) .Marland Kitchen.. 1 Tablet By Mouth Daily 5)  Copaxone 20 Mg/ml Kit (Glatiramer Acetate) 6)  Dicyclomine Hcl 20 Mg Tabs (Dicyclomine Hcl) .... Take 1 Tablet By Mouth Three Times A Day 7)  Tizanidine Hcl 4 Mg Tabs (Tizanidine Hcl) .... Take 1/2 To 1 Tablet By Mouth Up To 3 Times Per Day As Needed For Neck Pain **rx By Neurology** 8)  Ondansetron Hcl 4 Mg Tabs (Ondansetron Hcl) 9)  Lisinopril-Hydrochlorothiazide 10-12.5 Mg Tabs (Lisinopril-Hydrochlorothiazide) .... One Tablet By Mouth Daily 10)  Acetazolamide 250 Mg Tabs (Acetazolamide) .... Take One Tablet By Mouth Three Times A Day  **rx By Neurology** 11)  Ibuprofen 600 Mg Tabs (Ibuprofen) .... One Tablet By Mouth Three Times A Day As Needed For Pain 12)  Lortab 5 5-500 Mg Tabs (Hydrocodone-Acetaminophen) .Marland Kitchen.. 1 Tablet By Mouth Two Times A Day As Needed For Pain **one Time Prescription**  Allergies (verified): No Known Drug Allergies  Review of Systems CV:  Denies chest pain or discomfort. Resp:  Denies cough. GI:  Complains of diarrhea, nausea, and vomiting. MS:  Complains of joint pain; bil knee pain.  Physical Exam  General:  alert.   Head:  normocephalic.   Lungs:  normal breath sounds.   Heart:  normal rate and regular rhythm.   Abdomen:  normal bowel sounds.   Msk:  up to the exam table Neurologic:  alert & oriented X3.     Knee Exam  Knee Exam:    Right:    Inspection:  Normal    Palpation:  Normal       Location:  medial capsule    Stability:  stable    Tenderness:  medial collateral    Swelling:  no    Erythema:  no    crepitus    Left:    Inspection:  Normal       Location:  medial  capsule    Stability:  stable    Tenderness:  medial collateral    Swelling:  no    Erythema:  no   Impression & Recommendations:  Problem # 1:  KNEE PAIN, BILATERAL (ICD-719.46) will send for x-rays The following medications were removed from the medication list:    Lortab 5 5-500 Mg Tabs (Hydrocodone-acetaminophen) .Marland Kitchen... 1 tablet by mouth two times a day as needed for pain **one time prescription** Her updated medication list for this problem includes:    Tramadol Hcl 50 Mg Tabs (Tramadol hcl) .Marland Kitchen... Take 1 tablet by mouth as needed for pain    Tizanidine Hcl 4 Mg Tabs (Tizanidine hcl) .Marland Kitchen... Take 1/2 to 1 tablet by mouth up to 3 times per day as needed for neck pain **rx by neurology**    Ibuprofen 600 Mg Tabs (Ibuprofen) ..... One tablet by mouth three times a day as needed for pain  Orders: Radiology other (Radiology Other)  Problem # 2:  BARRETTS ESOPHAGUS (ICD-530.85) noted by pt's previous provider pt does need an EGD  Problem # 3:  HYPERTENSION, BENIGN ESSENTIAL (ICD-401.1)  Her updated medication list for this problem includes:    Furosemide 20 Mg Tabs (Furosemide) ..... One tablet by mouth daily as needed for fluid    Lisinopril-hydrochlorothiazide 10-12.5 Mg Tabs (Lisinopril-hydrochlorothiazide) ..... One tablet by mouth daily    Acetazolamide 250 Mg Tabs (Acetazolamide) .Marland Kitchen... Take one tablet by mouth three times a day  **rx by neurology**  Problem # 4:  MULTIPLE SCLEROSIS (ICD-340) pt has been back to neurology  Complete Medication List: 1)  Furosemide 20 Mg Tabs (Furosemide) .... One tablet by mouth daily as needed for fluid 2)  Tramadol Hcl 50 Mg Tabs (Tramadol hcl) .... Take 1 tablet by mouth as needed for pain 3)  Provigil 200 Mg Tabs (Modafinil) .... **rx by neurology** 4)  Klor-con M20 20 Meq Cr-tabs (Potassium chloride crys cr) .Marland Kitchen.. 1 tablet by mouth daily 5)  Copaxone 20 Mg/ml Kit (Glatiramer acetate) 6)  Dicyclomine Hcl 20 Mg Tabs (Dicyclomine hcl) ....  Take 1 tablet by mouth three times a day 7)  Tizanidine Hcl 4 Mg Tabs (Tizanidine hcl) .... Take 1/2 to 1 tablet by mouth up to 3 times per day as needed for neck pain **rx by neurology** 8)  Ondansetron Hcl 4 Mg Tabs (Ondansetron hcl) 9)  Lisinopril-hydrochlorothiazide 10-12.5 Mg Tabs (Lisinopril-hydrochlorothiazide) .... One tablet by mouth daily 10)  Acetazolamide 250 Mg Tabs (Acetazolamide) .... Take one tablet by mouth three times a day  **rx by neurology** 11)  Ibuprofen 600 Mg Tabs (Ibuprofen) .Marland KitchenMarland KitchenMarland Kitchen  One tablet by mouth three times a day as needed for pain 12)  Nexium 40 Mg Cpdr (Esomeprazole magnesium) .... One tablet by mouth daily for stomach before breakfast  Other Orders: TB Skin Test ([address])  Hypertension Assessment/Plan:      The patient's hypertensive risk group is category B: At least one risk factor (excluding diabetes) with no target organ damage.  Today's blood pressure is 120/70.  Her blood pressure goal is < 140/90.  Patient Instructions: 1)  You will need to get the x-rays of the knees. 2)  Follow up in this office in 1 month for x-ray results. 3)  Take nexium for your stomach - in the morning before meals  Prescriptions: NEXIUM 40 MG CPDR (ESOMEPRAZOLE MAGNESIUM) One tablet by mouth daily for stomach before breakfast  #30 x 5   Entered and Authorized by:   Lehman Prom FNP   Signed by:   Lehman Prom FNP on 06/28/2008   Method used:   Print then Give to Patient   RxID:   [id number]    PPD Application    Vaccine Type: PPD    Site: left forearm    Mfr: Sanofi Pasteur    Dose: 0.1 ml    Route: ID    Given by: Vesta Mixer CMA    Exp. Date: 06/26/2010    Lot #: [id number]

## 2010-04-18 NOTE — Progress Notes (Signed)
Summary: BP 177/101  Phone Note Call from Patient Call back at [facility] Phone [phone]   Summary of Call: [facility] pt. MS Guevarra SAYS THAT HER LEFT SIDE OF HER BODY FEELS NUMB AND HER SHOULDER HAS A PAIN AND THE LEFT HAND FEELS TINGLY. BEFORE SHE LEFT WORK, THEY CHECKED HER PRESSURE AND IT IS 177/101 AND HER HEAD FEELS TIGHT. Initial call taken by: Leodis Rains,  October 27, 2009 3:14 PM  Follow-up for Phone Call        Spoke with pt and she states she is not feeling well having all the above symptoms.  She did forget to take her bp med yesterday, but states that is the only time.  Per Zena Amos schedule pt for tomorrow at 4pm.  If she gets to feeling worse before then though may go to urgent care or the ED.  Pt states understanding. Follow-up by: Vesta Mixer CMA,  October 27, 2009 3:34 PM

## 2010-04-18 NOTE — Miscellaneous (Signed)
Summary: Hx - [facility]  Full record available in historical file  Past History:  Past Surgical History:    ventral hernia repair    s/p colon cancer with right hemicolectomy and chemo in 1999    gastric bypass 2008 Clinical Lists Changes  Problems: Changed problem from NEOPLASM, MALIGNANT, COLON, HX OF (ICD-V10.05) - s/p surgery and chemo to NEOPLASM, MALIGNANT, COLON, HX OF (ICD-V10.05) - s/p surgery and chemo Observations: Added new observation of CTABDPELVIS: postsurgical changes in the abdomen without acute abnormalities. Cholelithiasis without CT evidence of acute cholecystitis (07/14/2008 8:23) Added new observation of PAST SURG HX: ventral hernia repair s/p colon cancer with right hemicolectomy and chemo in 1999 gastric bypass 2008 (07/02/2008 8:18) Added new observation of COLONRECACT: Repeat colonoscopy in 5 years.  (09/04/2007 8:33) Added new observation of COLONOSCOPY: internal hemorrhoids otherwise an unremarkable exam (09/04/2007 8:33) Added new observation of UGI: Barrett's esophagus biopsied.   (09/04/2007 8:25) Added new observation of COLONOSCOPY: historical (03/19/2004 8:18)      CT Abdomen/Pelvis  Procedure date:  07/14/2008  Findings:      postsurgical changes in the abdomen without acute abnormalities. Cholelithiasis without CT evidence of acute cholecystitis  UGI  Procedure date:  09/04/2007  Findings:      Barrett's esophagus biopsied.    Colonoscopy  Procedure date:  09/04/2007  Findings:      internal hemorrhoids otherwise an unremarkable exam  Comments:      Repeat colonoscopy in 5 years.    CT Abdomen/Pelvis  Procedure date:  07/14/2008  Findings:      postsurgical changes in the abdomen without acute abnormalities. Cholelithiasis without CT evidence of acute cholecystitis  UGI  Procedure date:  09/04/2007  Findings:      Barrett's esophagus biopsied.    Colonoscopy  Procedure date:   09/04/2007  Findings:      internal hemorrhoids otherwise an unremarkable exam  Comments:      Repeat colonoscopy in 5 years.

## 2010-04-18 NOTE — Letter (Signed)
Summary: REQUESTING RECORDS FROM [facility]  REQUESTING RECORDS FROM [facility]   Imported By: Arta Bruce 05/27/2008 11:34:25  _____________________________________________________________________  External Attachment:    Type:   Image     Comment:   External Document

## 2010-04-18 NOTE — Letter (Signed)
Summary: REFERRAL/NEUROLOGY/APPT DATE & TIME  REFERRAL/NEUROLOGY/APPT DATE & TIME   Imported By: Arta Bruce 06/11/2008 12:36:17  _____________________________________________________________________  External Attachment:    Type:   Image     Comment:   External Document

## 2010-04-18 NOTE — Progress Notes (Signed)
Summary: IN PAIN FROM HER MS  Phone Note Call from Patient Call back at Home Phone [phone]   Reason for Call: Refill Medication Summary of Call: Misty [name] PT. SHE IS HAVING A LOT OF HEAD PAIN, AND HER APPT. TO SEE A NEUROLOGIST IS NOT UNTIL APRIL 1st, AND SHE WANTS TO KNOW IF WE CAN CALL IN A REFILL ON THE LORATAB OR EITHER PRESCRIBE THE PERCOCET. ONE OF THESE  2 HELPS HER. SHE SAYS SHE NEEDS SOMETHING BECAUSE SHE CAN'T HOLD OUT FOR THIS 2 WEEK APPT. SHE SAYS IF SHE CAN'T GET THE MEDICINE, CAN SOMEONE SEE HER AS SOON AS POSSIBLE THIS WEEK. HER KNEE IS INFLAMED AND IS ABOUT AS LARGE AS HER HEAD.  SHE USES [facility] [address]. Initial call taken by: Leodis Rains,  June 01, 2008 12:45 PM  Follow-up for Phone Call        Ibuprofen, Tramadol  last filled 05/24/2008. forward to N. [name],FNP Follow-up by: Levon Hedger,  June 01, 2008 12:58 PM  Additional Follow-up for Phone Call Additional follow up Details #1::        Pt. recently established. If opening with Jesse Fall, FNP, would try to put her in for her current complaints.   Otherwise, await provider return tomorrow to decide on pt's requests. Additional Follow-up by: Julieanne Manson MD,  June 02, 2008 11:46 AM    Additional Follow-up for Phone Call Additional follow up Details #2::    patient is going to try and call on the morning of 03/18 to see if there has been a cancellation.Cala Bradford Tinnin  June 02, 2008 3:20 PM   Additional Follow-up for Phone Call Additional follow up Details #3:: Details for Additional Follow-up Action Taken: Pt was on several meds by Neurology which i did not fill.  per previous note she has an appt with neurology on 06-17-2008. i will give her lortab (one time prescription). Rx in basket.  Fax to pharmacy of her choice. Advise her she will need to get all further pain meds from neurology once she re-establishes with them n.martin,fnp  June 03, 2008  8:46 AM   Left message on answering  machine for pt to call back............Marland KitchenArmenia Shannon  June 04, 2008 3:06 PM  Left message on machine for pt to return call to the office Levon Hedger  June 08, 2008 3:47 PM  Levon Hedger  June 10, 2008 3:41 PM Pt informed. Rx faxed to [facility].        New/Updated Medications: LORTAB 5 5-500 MG TABS (HYDROCODONE-ACETAMINOPHEN) 1 tablet by mouth two times a day as needed for pain **One time prescription**   Prescriptions: LORTAB 5 5-500 MG TABS (HYDROCODONE-ACETAMINOPHEN) 1 tablet by mouth two times a day as needed for pain **One time prescription**  #30 x 0   Entered and Authorized by:   Lehman Prom FNP   Signed by:   Lehman Prom FNP on 06/03/2008   Method used:   Print then Give to Patient   RxID:   [id number]

## 2010-04-18 NOTE — Letter (Signed)
Summary: *HSN Results Follow up  [facility]  [address] [place], [place] [address]   Phone: [phone]  Fax: [phone]      09/06/2008   [facility] [address] [place], [place]  [address]   Dear  Ms. Claryssa Gensel,                            ____S.Drinkard,FNP   ____D. Gore,FNP       ____B. McPherson,MD   ____V. Rankins,MD    ____E. Mulberry,MD    ____N. Daphine Deutscher, FNP  ____D. Reche Dixon, MD    ____K. Philipp Deputy, MD    ____Other     This letter is to inform you that your recent test(s):  _______Pap Smear    _______Lab Test     _______X-ray    _______ is within acceptable limits  _______ requires a medication change  _______ requires a follow-up lab visit  _______ requires a follow-up visit with your provider   Comments:  We have been trying to contact you at [phone].  For your blood pressure medication, you should never self discontinue medications.  You need to contact the office so that we may schedule an nurse visit for you to come in an re-check your blood pressure at that time your medication can be adjusted.  You need to take your Lisinopril/HCTZ before the visit.  Also you need to schedule an appointment with your provider.       _________________________________________________________ If you have any questions, please contact our office                     Sincerely,  Levon Hedger [facility]

## 2010-04-18 NOTE — Letter (Signed)
Summary: RECORDS RECEIVED FROM [facility]/IN PAPER CHART  RECORDS RECEIVED FROM [facility]/IN PAPER CHART   Imported By: Arta Bruce 10/25/2008 12:37:19  _____________________________________________________________________  External Attachment:    Type:   Image     Comment:   External Document

## 2010-04-18 NOTE — Progress Notes (Signed)
Summary: Medication refills  Phone Note Call from Patient Call back at [facility] Phone [phone]   Summary of Call: THE PT NEEDS MORE REFILS FROM ACETZOLOMIDE, DECYCLOMINE, FUROSEMIDE, LISINOPRIL, CLYCLOBENZAPRINE, IBUPROFEN, POTASSIUM,COPAXONE, AND DICYCLOMINE AND SHE USES [facility] [address]. [name] FNP Initial call taken by: Manon Hilding,  August 25, 2009 3:12 PM  Follow-up for Phone Call        forward to N. Daphine Deutscher, FNP  Additional Follow-up for Phone Call Additional follow up Details #1::        Will send Rx to [facility] [address] except for Azetazolamide and Capazone - pt aware that she gets this meds from neurology.  Will not fill.  if she has not been to neurology will suggest she call them to see if they will refill her meds Additional Follow-up by: Lehman Prom FNP,  August 26, 2009 9:15 AM    Additional Follow-up for Phone Call Additional follow up Details #2::    Levon Hedger  August 26, 2009 9:21 AM Left message on machine for pt to return call to the office.  Left message on voicemail to return call. Dutch Quint RN  August 29, 2009 10:10 AM   Additional Follow-up for Phone Call Additional follow up Details #3:: Details for Additional Follow-up Action Taken: Pt. notified She wanted Azetazolamide and Capazone refilled. She has not been to a Neurolgist in over a year. Encouraged her to contact neurology office for refills she may have to schedule an appt. with them in order to get her refills. Gaylyn Cheers RN  August 30, 2009 9:53 AM   Prescriptions: TRAMADOL HCL 50 MG TABS (TRAMADOL HCL) One tablet by mouth two times a day as needed for pain  #60 x 0   Entered and Authorized by:   Lehman Prom FNP   Signed by:   Lehman Prom FNP on 08/26/2009   Method used:   Printed then faxed to ...       [facility]  [address]  [phone]* (retail)       [address]       [place], [place]  [address]       Ph: [phone] or [phone]  Fax: [phone]   RxID:   [id number] CYCLOBENZAPRINE HCL 10 MG TABS (CYCLOBENZAPRINE HCL) One tablet by mouth nightly as needed for muscles  #30 x 0   Entered and Authorized by:   Lehman Prom FNP   Signed by:   Lehman Prom FNP on 08/26/2009   Method used:   Printed then faxed to ...       [facility]  [address]  [phone]* (retail)       [address]       [place], [place]  [address]       Ph: [phone] or [phone]       Fax: [phone]   RxID:   [id number] IBUPROFEN 600 MG TABS (IBUPROFEN) One tablet by mouth three times a day as needed for pain  #60 x 1   Entered and Authorized by:   Lehman Prom FNP   Signed by:   Lehman Prom FNP on 08/26/2009   Method used:   Printed then faxed to ...       [facility]  [phone]* (retail)       [address]       [place], [place]  [address]       Ph: [phone] or [phone]       Fax: [phone]   RxID:   [id number] LISINOPRIL-HYDROCHLOROTHIAZIDE 10-12.5 MG TABS (LISINOPRIL-HYDROCHLOROTHIAZIDE) One tablet by mouth daily  #30 x 5   Entered and Authorized by:   Lehman Prom FNP   Signed by:   Lehman Prom FNP on 08/26/2009   Method used:   Printed then faxed to ...       [facility]  [address]  [phone]* (retail)       [address]       [place], [place]  [address]       Ph: [phone] or [phone]       Fax: [phone]   RxID:   [id number] DICYCLOMINE HCL 20 MG TABS (DICYCLOMINE HCL) Take 1 tablet by mouth three times a day  #90 x 3   Entered and Authorized by:   Lehman Prom FNP   Signed by:   Lehman Prom FNP on 08/26/2009   Method used:   Printed then faxed to ...       [facility]  [address]  [phone]* (retail)       [address]       [place], [place]  [address]       Ph: [phone] or [phone]       Fax: [phone]   RxID:   [id number] KLOR-CON  M20 20 MEQ CR-TABS (POTASSIUM CHLORIDE CRYS CR) 1 tablet by mouth daily  #30 x 5   Entered and Authorized by:   Lehman Prom FNP   Signed by:   Lehman Prom FNP on 08/26/2009   Method used:   Printed then faxed to ...       [facility]  [address]  [phone]* (retail)       [address]       [place], [place]  [address]       Ph: [phone] or [phone]       Fax: [phone]   RxID:   [id number] FUROSEMIDE 20 MG TABS (FUROSEMIDE) One tablet by mouth daily as needed for fluid  #30 x 5   Entered and Authorized by:   Lehman Prom FNP   Signed by:   Lehman Prom FNP on 08/26/2009   Method used:   Printed then faxed to ...       [facility]  [address]  [phone]* (retail)       [address]       [place], [place]  [address]       Ph: [phone] or [phone]       Fax: [phone]   RxID:   [id number]

## 2010-04-18 NOTE — Progress Notes (Signed)
Summary: Meds needed for yeast infection and refill of tramadol  Phone Note Call from Patient Call back at [phone]   Complaint: Chest Pain Summary of Call: pt is requesting meds for uti. and refills on trimadol pharmacy [facility] [address]  Initial call taken by: Domenic Polite,  December 27, 2009 12:41 PM  Follow-up for Phone Call        Left message on voicemail for pt. to return call.  Dutch Quint RN  December 27, 2009 2:46 PM  Requesting meds for a yeast infection, having some faint discharge, but mostly some vaginal discomfort.  Denies abdominal cramping.  Has taken recent antibiotic.  Tried some OTC monistat cream not effective.   Also needs new Rx for tramadol, is it possible to get a 90-day prescription ($5.00 Rx in [facility]?) Follow-up by: Dutch Quint RN,  December 27, 2009 3:42 PM  Additional Follow-up for Phone Call Additional follow up Details #1::        rx for fluconazole [facility] - if symptoms continue she will need a visit regarding tramadol, don't think there is a $5 option will send 90 tablets, she can purchase some of all of the Rx based on the cost Additional Follow-up by: Lehman Prom FNP,  December 27, 2009 6:17 PM    Additional Follow-up for Phone Call Additional follow up Details #2::    Left message on voicemail for pt. to return call.  Dutch Quint RN  December 28, 2009 11:41 AM  Notified of refills and new Rx.  Advised to see how symptoms change over the next few days and to call back if they worsen or persist.  Verbalized agreement. Follow-up by: Dutch Quint RN,  December 28, 2009 12:18 PM  New/Updated Medications: TRAMADOL HCL 50 MG TABS (TRAMADOL HCL) One tablet by mouth two times a day as needed for pain FLUCONAZOLE 150 MG TABS (FLUCONAZOLE) one tablet by mouth x 1 dose Prescriptions: TRAMADOL HCL 50 MG TABS (TRAMADOL HCL) One tablet by mouth two times a day as needed for pain  #90 x 0   Entered and Authorized by:   Lehman Prom  FNP   Signed by:   Lehman Prom FNP on 12/27/2009   Method used:   Electronically to        [facility]  [phone]* (retail)       [address]       [place], [place]  [address]       Ph: [phone] or [phone]       Fax: [phone]   RxID:   [id number] FLUCONAZOLE 150 MG TABS (FLUCONAZOLE) one tablet by mouth x 1 dose  #1 x 0   Entered and Authorized by:   Lehman Prom FNP   Signed by:   Lehman Prom FNP on 12/27/2009   Method used:   Electronically to        [facility]  [phone]* (retail)       [address]       [place], [place]  [address]       Ph: [phone] or [phone]       Fax: [phone]   RxID:   [id number]

## 2010-04-18 NOTE — Letter (Signed)
Summary: OUTPT CHEMISTRY  OUTPT CHEMISTRY   Imported By: Arta Bruce 08/18/2009 12:59:41  _____________________________________________________________________  External Attachment:    Type:   Image     Comment:   External Document

## 2010-04-18 NOTE — Assessment & Plan Note (Signed)
Summary: PPD READING////RJP  Nurse Visit     Allergies: No Known Drug Allergies    PPD Results    Date of reading: 06/30/2008    Results: < 5mm    Interpretation: negative   Orders Added: 1)  Est. Patient Nurse visit [09003]    ]

## 2010-04-18 NOTE — Letter (Signed)
Summary: RECEIVED RECORDS FROM [facility] /DR.ORR/IN HISTORICAL C  RECEIVED RECORDS FROM [facility] /DR.ORR/IN HISTORICAL CHART   Imported By: Arta Bruce 07/02/2008 09:42:37  _____________________________________________________________________  External Attachment:    Type:   Image     Comment:   External Document

## 2010-04-18 NOTE — Letter (Signed)
Summary: [facility]  [facility]   Imported By: Arta Bruce 04/27/2009 15:20:09  _____________________________________________________________________  External Attachment:    Type:   Image     Comment:   External Document

## 2010-04-18 NOTE — Progress Notes (Signed)
Summary: FYI  Phone Note Call from Patient Call back at [facility] Phone [phone]   Summary of Call: [name] PT. MS Nickle WANTED TO LET YOU KNOW THAT SHE CANCELLED THE APPT. TODAY AND WENT ON TO THE URGENT CARE YESTERDAY AND THEY DIAGNOSED HER WITH TENDANITIS IN THE SHOULDER. Initial call taken by: Leodis Rains,  October 28, 2009 3:10 PM  Follow-up for Phone Call        noted Follow-up by: Lehman Prom FNP,  October 28, 2009 3:20 PM

## 2010-04-18 NOTE — Progress Notes (Signed)
Summary: REFILL   Phone Note Call from Patient Call back at [phone]   Reason for Call: Refill Medication Summary of Call: NYKEDTRA PT. MS Masci IS CALLING BECAUSE SHE NEEDS A REFILL ON HER FLEXERIL AND THE MEDICATION THAT SHE TAKES FOR HER NASUEA, THAT SHE TAKES FOR HER IBS. CALLED INTO [facility] [address] Initial call taken by: Leodis Rains,  February 15, 2009 12:43 PM  Follow-up for Phone Call        forward to a covering provider to review..... Follow-up by: Mikey College CMA,  February 22, 2009 10:45 AM    New/Updated Medications: ONDANSETRON HCL 4 MG TABS (ONDANSETRON HCL) Take 1 tablet by mouth every 8 hours as needed for nausea Prescriptions: ONDANSETRON HCL 4 MG TABS (ONDANSETRON HCL) Take 1 tablet by mouth every 8 hours as needed for nausea  #30 x 0   Entered and Authorized by:   Tereso Newcomer PA-C   Signed by:   Tereso Newcomer PA-C on 02/23/2009   Method used:   Electronically to        [facility]  [phone]* (retail)       [address]       [place], [place]  [address]       Ph: [phone] or [phone]       Fax: [phone]   RxID:   [id number] CYCLOBENZAPRINE HCL 10 MG TABS (CYCLOBENZAPRINE HCL) One tablet by mouth nightly as needed for muscles  #30 x 0   Entered and Authorized by:   Tereso Newcomer PA-C   Signed by:   Tereso Newcomer PA-C on 02/23/2009   Method used:   Electronically to        [facility]  [phone]* (retail)       [address]       [place], [place]  [address]       Ph: [phone] or [phone]       Fax: [phone]   RxID:   [id number]

## 2010-04-18 NOTE — Assessment & Plan Note (Signed)
Summary: NEW - Establish Care   Vital Signs:  Patient Profile:   50 Years Old Female Height:     65 inches Weight:      263.5 pounds BMI:     44.01 BSA:     2.23 Temp:     97.7 degrees F oral Pulse rate:   84 / minute Pulse rhythm:   regular Resp:     20 per minute BP sitting:   130 / 90  Pt. in pain?   yes    Location:   head, legs  Vitals Entered By: Levon Hedger (May 24, 2008 2:13 PM)  Menstrual History: LMP - Character: partial hysterectomy              Is Patient Diabetic? No  Does patient need assistance? Ambulation Normal   Last PAP Date 08/2007   Chief Complaint:  new establish/ MS and having alot of head pressure.  History of Present Illness:  Pt into the office to establish care.  [facility] Dr. Earlene Plater  Dr. Felizardo Hoffmann (gastroenterology) - Pt has been seeing that provider for several years.  Last colonscopy was done in 2009 (?). She reports that she usually gets colonscopy once per year due to history of colon cancer. She reports that it is also time for EGD. No family history of colon cancer  MS - dx in 2004.   Pt was previously seen by Dr. Anne Shutter. [name] neurology) She was to f/u in November 2009 however she did not go due to lapse of insurance Daily injections. (pt has been getting her injections through a pt assistance for which she will get meds for 6 months) Pseudo tumor for which she was taking diamox.  She is still having some blurred vision. Unable to even read. She does not feel safe to drive and reports that she was told by her previous provider not to drive. leg weakness is decreasing but she is having muscle spasms. She was prescribed zanaflex. +headaches +dizziness Spinal tap done x 3 to relieve pressure. Last CT done when pt was previously seen at neurology.  Social - single  4 children she has not worked in over 1 year but she is a Engineer, site by profession. pt does have a desire to go back to work but she is not  able at this time.  Hypertension History:      She complains of headache, but denies chest pain and palpitations.  Further comments include: reports that she has been taking the lisinopril/HCTZ and lasix.        Positive major cardiovascular risk factors include hypertension and family history for ischemic heart disease (females less than 25 years old).  Negative major cardiovascular risk factors include female age less than 48 years old and non-tobacco-user status.        Updated Prior Medication List: ZOFRAN 8 MG TABS (ONDANSETRON HCL)  OXYCODONE HCL 5 MG TABS (OXYCODONE HCL)  FUROSEMIDE 20 MG TABS (FUROSEMIDE)  TRAMADOL HCL 50 MG TABS (TRAMADOL HCL)  PROVIGIL 200 MG TABS (MODAFINIL)  KAON-CL-10 10 MEQ CR-TABS (POTASSIUM CHLORIDE)  COPAXONE 20 MG/ML KIT (GLATIRAMER ACETATE)  DICYCLOMINE HCL 20 MG TABS (DICYCLOMINE HCL)  TIZANIDINE HCL 4 MG TABS (TIZANIDINE HCL)  ONDANSETRON HCL 4 MG TABS (ONDANSETRON HCL)   Current Allergies (reviewed today): No known allergies   Past Surgical History:    ventral hernia repair    s/p colon cancer with surgery and chemo in 1990    gastric bypass 2008  Family History:    htn - mother    CVA - mother    no family hx of colon cancer or MS  Social History:    single    4 children    no tobacco    no drug use    no ETOH   Risk Factors:  Tobacco use:  never Drug use:  no Alcohol use:  no Seatbelt use:  100 % Sun Exposure:  occasionally  Family History Risk Factors:    Family History of MI in females < 52 years old:  yes    Family History of MI in males < 34 years old:  no   Review of Systems  General      Complains of sleep disorder.      unable to sleep during the night - pt was previously taking [place] and she also was taking tramadol  Eyes      Complains of blurring.  GI      Complains of abdominal pain and diarrhea.  MS      Complains of joint pain and cramps.      left knee pain cramps - bil lower  extremities   Physical Exam  General:     alert.   Head:     normocephalic.   Lungs:     normal breath sounds.   Heart:     normal rate and regular rhythm.   Abdomen:     normal bowel sounds.   Msk:     decreased ROM.  lower extremities Neurologic:     slow steady gait   Knee Exam  General:    obese.    Knee Exam:    Right:    Tenderness:  no    Swelling:  diffuse    Erythema:  no    Left:    Tenderness:  no    Swelling:  diffuse    Erythema:  no    Impression & Recommendations:  Problem # 1:  HYPERTENSION, BENIGN ESSENTIAL (ICD-401.1) continue current meds Her updated medication list for this problem includes:    Furosemide 20 Mg Tabs (Furosemide) ..... One tablet by mouth daily as needed for fluid    Lisinopril-hydrochlorothiazide 10-12.5 Mg Tabs (Lisinopril-hydrochlorothiazide) ..... One tablet by mouth daily    Acetazolamide 250 Mg Tabs (Acetazolamide) .Marland Kitchen... Take one tablet by mouth three times a day  **rx by neurology**  Orders: T-General Health Panel (CBCD, CMP, TSH) ([address])   Problem # 2:  MULTIPLE SCLEROSIS (ICD-340) will refer back to [facility] so pt can restarted on meds Orders: Neurology Referral (Neuro) T-General Health Panel (CBCD, CMP, TSH) ([address]) TLB-Sedimentation Rate (ESR) ([address]-ESR) T-Antinuclear Antib (ANA) ([id number])   Problem # 3:  IRRITABLE BOWEL SYNDROME (ICD-564.1) need to get GI records  Problem # 4:  NEOPLASM, MALIGNANT, COLON, HX OF (ICD-V10.05) need to review records from GI  Problem # 5:  OBESITY (ICD-278.00) pt is s/p gastric bypass in 2008 however she has gained about 50 pounds back of the 100 pounds lost due to inactviity  Complete Medication List: 1)  Furosemide 20 Mg Tabs (Furosemide) .... One tablet by mouth daily as needed for fluid 2)  Tramadol Hcl 50 Mg Tabs (Tramadol hcl) .... Take 1 tablet by mouth as needed for pain 3)  Provigil 200 Mg Tabs (Modafinil) .... **rx by neurology** 4)   Klor-con M20 20 Meq Cr-tabs (Potassium chloride crys cr) .Marland Kitchen.. 1 tablet by mouth daily 5)  Copaxone 20 Mg/ml Kit (Glatiramer  acetate) 6)  Dicyclomine Hcl 20 Mg Tabs (Dicyclomine hcl) .... Take 1 tablet by mouth three times a day 7)  Tizanidine Hcl 4 Mg Tabs (Tizanidine hcl) .... Take 1/2 to 1 tablet by mouth up to 3 times per day as needed for neck pain **rx by neurology** 8)  Ondansetron Hcl 4 Mg Tabs (Ondansetron hcl) 9)  Lisinopril-hydrochlorothiazide 10-12.5 Mg Tabs (Lisinopril-hydrochlorothiazide) .... One tablet by mouth daily 10)  Acetazolamide 250 Mg Tabs (Acetazolamide) .... Take one tablet by mouth three times a day  **rx by neurology** 11)  Ibuprofen 600 Mg Tabs (Ibuprofen) .... One tablet by mouth three times a day as needed for pain  Anticoagulation Management Assessment/Plan:       Hypertension Assessment/Plan:      The patient's hypertensive risk group is category B: At least one risk factor (excluding diabetes) with no target organ damage.  Today's blood pressure is 130/90.  Her blood pressure goal is < 140/90.   Patient Instructions: 1)  Need to sign a release to get records from [facility]) and  2)  Gastroenterology - Dr. Virginia Rochester. 3)  This office will refer you back to [facility].  They will be responsible for restarting your medications. 4)  Follow up in this office in 1 month or sooner if necessary   Prescriptions: TRAMADOL HCL 50 MG TABS (TRAMADOL HCL) Take 1 tablet by mouth as needed for pain  #30 x 0   Entered and Authorized by:   Lehman Prom FNP   Signed by:   Lehman Prom FNP on 05/24/2008   Method used:   Print then Give to Patient   RxID:   [id number] DICYCLOMINE HCL 20 MG TABS (DICYCLOMINE HCL) Take 1 tablet by mouth three times a day  #90 x 3   Entered and Authorized by:   Lehman Prom FNP   Signed by:   Lehman Prom FNP on 05/24/2008   Method used:   Print then Give to Patient   RxID:   [id number] KLOR-CON M20 20  MEQ CR-TABS (POTASSIUM CHLORIDE CRYS CR) 1 tablet by mouth daily  #30 x 3   Entered and Authorized by:   Lehman Prom FNP   Signed by:   Lehman Prom FNP on 05/24/2008   Method used:   Print then Give to Patient   RxID:   [id number]    Orders Added: 1)  Est. Patient Level III [01027] 2)  Neurology Referral [Neuro] 3)  T-General Health Panel (CBCD, CMP, TSH) [[address]] 4)  TLB-Sedimentation Rate (ESR) [85652-ESR] 5)  T-Antinuclear Antib (ANA) [[id number]]

## 2010-04-18 NOTE — Letter (Signed)
Summary: Generic Letter  [facility]  [address] [place], [place] [address]   Phone: [phone]  Fax: [phone]    03/04/2009  [facility] [address] [place], [place]  [address]  Dear Ms.   This office has recieved correspondence that you DID NOT keep your appointment at [facility] that was scheduled on 02/17/2009.  Be advised that you have a chronic medical condition and this office has been trying to get you a referral for quite some time.  The appointment with neurology is very important.  Call [facility] at [phone] to reschedule this appointment and hopefully you will be able to set it on a date and time that is convient for you.    Sincerely,     Lehman Prom FNP [facility]

## 2010-04-18 NOTE — Letter (Signed)
Summary: TEST ORDER FORM/MRI/APPT DATE & TIME  TEST ORDER FORM/MRI/APPT DATE & TIME   Imported By: Arta Bruce 07/02/2008 10:52:14  _____________________________________________________________________  External Attachment:    Type:   Image     Comment:   External Document

## 2010-04-18 NOTE — Progress Notes (Signed)
Summary: Med refills  Phone Note Call from Patient Call back at [facility] Phone [phone]   Summary of Call: The pt needs more medical refills from furosimide and tramadol.  ([facility] Daphine Deutscher FNP Initial call taken by: Manon Hilding,  November 09, 2008 3:15 PM  Follow-up for Phone Call        forward to N. Daphine Deutscher, FNP Levon Hedger  November 09, 2008 3:19 PM   Additional Follow-up for Phone Call Additional follow up Details #1::        Rx printed. in basket [facility] to [facility] notify pt Additional Follow-up by: Lehman Prom FNP,  November 10, 2008 8:16 AM    Additional Follow-up for Phone Call Additional follow up Details #2::    pt informed.  Rx faxed to [facility] [address]. Follow-up by: Levon Hedger,  November 12, 2008 11:21 AM  Prescriptions: TRAMADOL HCL 50 MG TABS (TRAMADOL HCL) Take 1 tablet by mouth daily as needed for pain  #30 x 1   Entered and Authorized by:   Lehman Prom FNP   Signed by:   Lehman Prom FNP on 11/10/2008   Method used:   Printed then faxed to ...       [facility]  [address]  [phone]* (retail)       [address]       [place], [place]  [address]       Ph: [phone] or [phone]       Fax: [phone]   RxID:   [id number] FUROSEMIDE 20 MG TABS (FUROSEMIDE) One tablet by mouth daily as needed for fluid  #30 x 1   Entered and Authorized by:   Lehman Prom FNP   Signed by:   Lehman Prom FNP on 11/10/2008   Method used:   Printed then faxed to ...       [facility]  [address]  [phone]* (retail)       [address]       [place], [place]  [address]       Ph: [phone] or [phone]       Fax: [phone]   RxID:   [id number]

## 2010-04-18 NOTE — Progress Notes (Signed)
Summary: Pain medications  Phone Note Outgoing Call   Summary of Call: Pt is requesting a refill of Lortab. she was given 20 tabs on 11/18/2008 for back pain (acute)  Will NOT refill this medication. Will not prescribe chronic narcotics for pain. That will need to be done at a pain clinic I realize that she has a rheumatology appt at [facility] in December which may be helpful in refilling some of her RA meds. Initial call taken by: Lehman Prom FNP,  December 16, 2008 11:45 AM  Follow-up for Phone Call        left message on machine for pt to return call to the office. Levon Hedger  December 17, 2008 3:32 PM  Levon Hedger  December 17, 2008 4:40 PM pt informed of information for not getting chronic pain meds through this office.

## 2010-04-18 NOTE — Progress Notes (Signed)
Summary: TERRIBLE BACK PAIN  Phone Note Call from Patient Call back at [phone] CELL   Summary of Call: [facility]TRA PT. WENT [facility] LAST WEEK AND THEY DID AN X-RAY OF HER BACK AND FOUND THAT SHE HAS A SLIP DISC AND HER BACK IS INFLAMED WITH ARTHRITIS. SHE WAS PRESCIRBE D HYDROCODONE, FLEXERIL AND A 12 DAY PACK PREDNISONE AND A INJECTION OF TRAZADONE FOR THE PAIN AND SHE IS STILL IN A LOT OF PAIN. SHE WANTS TO KNOW WHAT TO DO NEXT, AND DO YOU FEEL IF SHE GETS A BACK BRACE, WILL THAT HELP HER WITH STANDING AND WALKING.  SHE DROPPED OFF PAPER FROM WHERE SHE WENT TO THE HOSPITAL.  MS Stork WANTS TO KNOW IF YOU CAN CALL IN HYDROCODONE  AND FLEXERIL TO [facility] [address]   PT SAYS THAT SHE HAD AN APPOINTMENT ON MONDAY,  august 30 with [facility] AND IS IN THE ROOM WITH RICARDO Yetta Barre for his appt.Levon Hedger  November 19, 2008 4:07 PM  Initial call taken by: Leodis Rains,  November 19, 2008 2:28 PM  Follow-up for Phone Call        advise pt that she can get a lumbar support for her back and she can wear this while at work. Will give lortab #20 tabs for acute injury. This provider will NOT prescribe narcotics long term. This is a one time RX. Will give flexeril as needed  Both Rx in basket Follow-up by: Lehman Prom FNP,  November 23, 2008 8:42 AM  Additional Follow-up for Phone Call Additional follow up Details #1::        pt informed.  Rx faxed to [facility]. Additional Follow-up by: Levon Hedger,  November 23, 2008 11:52 AM    New/Updated Medications: LORTAB 5 5-500 MG TABS (HYDROCODONE-ACETAMINOPHEN) One tablet by mouth two times a day as needed for pain CYCLOBENZAPRINE HCL 10 MG TABS (CYCLOBENZAPRINE HCL) One tablet by mouth nightly as needed for muscles Prescriptions: CYCLOBENZAPRINE HCL 10 MG TABS (CYCLOBENZAPRINE HCL) One tablet by mouth nightly as needed for muscles  #30 x 0   Entered and Authorized by:   Lehman Prom FNP   Signed by:    Lehman Prom FNP on 11/23/2008   Method used:   Printed then faxed to ...       [facility]  [address]  [phone]* (retail)       [address]       [place], [place]  [address]       Ph: [phone] or [phone]       Fax: [phone]   RxID:   [id number] LORTAB 5 5-500 MG TABS (HYDROCODONE-ACETAMINOPHEN) One tablet by mouth two times a day as needed for pain  #20 x 0   Entered and Authorized by:   Lehman Prom FNP   Signed by:   Lehman Prom FNP on 11/23/2008   Method used:   Printed then faxed to ...       [facility]  [address]  [phone]* (retail)       [address]       [place], [place]  [address]       Ph: [phone] or [phone]       Fax: [phone]   RxID:   [id number]

## 2010-04-18 NOTE — Progress Notes (Signed)
Summary: re-adjust medicatioon  Phone Note Call from Patient Call back at [facility] Phone [phone]   Caller: Patient Summary of Call: The pt was using lisionopril but this medication is not helping her.  The med needs to be re-adjusted Ulanda Edison Initial call taken by: Manon Hilding,  August 23, 2008 11:27 AM  Follow-up for Phone Call        spoke with pt she says that she wants to come in to talk about her BP medication and furosemide.  Her Bp has been elevated about 2 weeks and it does not seem to be coming down.  she says that she was taking a med called Nadol and she took herself off that medication.  Pt states she lost about 30 lbs and gained it back, she is having alot of pain in her knee,swelling and can't bend it.  She says that she called the Ortho doctor and she cna not afford the copay and having a hard time paying her own bills. She wants to know what she can do she said that one of the ladies up front tried to give her an appt in July and she said she needs to be seen before that.  I told her that I would send this to the provider. Follow-up by: Levon Hedger,  August 24, 2008 5:32 PM  Additional Follow-up for Phone Call Additional follow up Details #1::        Knee - pt has had x-ray and MRI.  I did an ortho referral and it appears it was sent to [facility] back in April.  Perhaps it needs to be followed up and check the status since it has been 1 month since originally sent.  She has a tear one of the ligaments in her knee that unfortunately most lkely needs some surgical intervention by ortho. BP - Instruct pt to NEVER self discontinue medications.  She will need to keep the office visit appt that she has.  She can schedule a nurse visit for BP check ONLY, at which time her bp meds can be adjusted.  she needs to take her lisinopril/hctz before the visit. Additional Follow-up by: Lehman Prom FNP,  August 25, 2008 8:06 AM    Additional Follow-up for Phone Call Additional  follow up Details #2::    Levon Hedger  August 27, 2008 2:36 PM Left message on machine for pt to return call to the office.  Levon Hedger  August 31, 2008 4:37 PM Left message on machine for pt to return call to the office.  Levon Hedger  September 06, 2008 5:03 PM Left message on machine for pt to return call to the office.  Will mail letter.

## 2010-04-18 NOTE — Letter (Signed)
Summary: TEST ORDER/MRI WITHOUT CONTRAST  TEST ORDER/MRI WITHOUT CONTRAST   Imported By: Arta Bruce 07/16/2008 12:57:43  _____________________________________________________________________  External Attachment:    Type:   Image     Comment:   External Document

## 2010-04-19 ENCOUNTER — Other Ambulatory Visit: Payer: Self-pay

## 2010-04-19 ENCOUNTER — Inpatient Hospital Stay (INDEPENDENT_AMBULATORY_CARE_PROVIDER_SITE_OTHER)
Admission: RE | Admit: 2010-04-19 | Discharge: 2010-04-19 | Disposition: A | Discharge status: Home / Self Care | Payer: Self-pay | Source: Ambulatory Visit | Attending: Emergency Medicine | Admitting: Emergency Medicine

## 2010-04-19 DIAGNOSIS — Z888 Allergy status to other drugs, medicaments and biological substances status: Secondary | ICD-10-CM

## 2010-04-19 DIAGNOSIS — R42 Dizziness and giddiness: Secondary | ICD-10-CM

## 2010-04-19 DIAGNOSIS — R51 Headache: Secondary | ICD-10-CM

## 2010-04-19 LAB — CONVERTED CEMR LAB
ALT: 16 units/L (ref 0–35)
AST: 18 units/L (ref 0–37)
Albumin: 4 g/dL (ref 3.5–5.2)
Alkaline Phosphatase: 69 units/L (ref 39–117)
BUN: 10 mg/dL (ref 6–23)
Basophils Absolute: 0 10*3/uL (ref 0.0–0.1)
Basophils Relative: 0 % (ref 0–1)
CO2: 26 meq/L (ref 19–32)
Calcium: 8.9 mg/dL (ref 8.4–10.5)
Chloride: 99 meq/L (ref 96–112)
Cholesterol: 230 mg/dL — ABNORMAL HIGH (ref 0–200)
Creatinine, Ser: 0.59 mg/dL (ref 0.40–1.20)
Eosinophils Absolute: 0.1 10*3/uL (ref 0.0–0.7)
Eosinophils Relative: 2 % (ref 0–5)
Glucose, Bld: 101 mg/dL — ABNORMAL HIGH (ref 70–99)
HCT: 38.5 % (ref 36.0–46.0)
HDL: 105 mg/dL (ref >39)
HIV: NONREACTIVE
Hemoglobin: 12 g/dL (ref 12.0–15.0)
LDL Cholesterol: 104 mg/dL — ABNORMAL HIGH (ref 0–99)
Lymphocytes Relative: 22 % (ref 12–46)
Lymphs Abs: 1.3 10*3/uL (ref 0.7–4.0)
MCHC: 31.2 g/dL (ref 30.0–36.0)
MCV: 107.5 fL — ABNORMAL HIGH (ref 78.0–100.0)
Monocytes Absolute: 0.4 10*3/uL (ref 0.1–1.0)
Monocytes Relative: 6 % (ref 3–12)
Neutro Abs: 4.2 10*3/uL (ref 1.7–7.7)
Neutrophils Relative %: 70 % (ref 43–77)
Platelets: 210 10*3/uL (ref 150–400)
Potassium: 3.8 meq/L (ref 3.5–5.3)
RBC: 3.58 M/uL — ABNORMAL LOW (ref 3.87–5.11)
RDW: 15.1 % (ref 11.5–15.5)
Sodium: 135 meq/L (ref 135–145)
Total Bilirubin: 1.7 mg/dL — ABNORMAL HIGH (ref 0.3–1.2)
Total CHOL/HDL Ratio: 2.2
Total Protein: 6.9 g/dL (ref 6.0–8.3)
Triglycerides: 104 mg/dL (ref <150)
VLDL: 21 mg/dL (ref 0–40)
Vit D, 25-Hydroxy: <10 ng/mL — ABNORMAL LOW (ref 30–89)
WBC: 6 10*3/uL (ref 4.0–10.5)

## 2010-04-20 ENCOUNTER — Telehealth (INDEPENDENT_AMBULATORY_CARE_PROVIDER_SITE_OTHER): Payer: Self-pay | Admitting: Nurse Practitioner

## 2010-04-20 LAB — POCT I-STAT, CHEM 8
BUN: 24 mg/dL — ABNORMAL HIGH (ref 6–23)
Calcium, Ion: 1.19 mmol/L (ref 1.12–1.32)
Chloride: 102 mEq/L (ref 96–112)
Creatinine, Ser: 1.3 mg/dL — ABNORMAL HIGH (ref 0.4–1.2)
Glucose, Bld: 104 mg/dL — ABNORMAL HIGH (ref 70–99)
HCT: 43 % (ref 36.0–46.0)
Hemoglobin: 14.6 g/dL (ref 12.0–15.0)
Potassium: 3.7 mEq/L (ref 3.5–5.1)
Sodium: 138 mEq/L (ref 135–145)
TCO2: 29 mmol/L (ref 0–100)

## 2010-04-20 NOTE — Progress Notes (Signed)
Summary: PRESSURE IN FEMALE AREA  Phone Note Call from Patient   Reason for Call: Talk to Nurse Summary of Call: [facility] pt.  MS Bayly CALLED BECAUSE SHE HAS BEEN EXPERIENCING A LOT OF PRESSURE IN HER FEMALE AREA SINCE YESTERDAY, SHE HAS PAIN ASSOCIATED WITH IT AND SHE ALSO SAYS THAT SHE FEELS IT WHEN SHE IS UP MOVING AND WHEN SHE IS LYING DOWN, AND SHE THINKS SHE IS RUNNING A FEVER. Initial call taken by: Leodis Rains,  April 13, 2010 12:56 PM  Follow-up for Phone Call        Has been going on for about a week, but started getting worse last night, constant.  Having pain in lower stomach, is hard, feeling "knots" -- was told it was scar tissue, but says it's different.  Pain is more like pressure, like a cramping feeling.   Having lower abdomen/pelvic and rectal pressure, has had some frank rectal bleeding.  Stool smells different as well.  Is worried that cancer is coming back.  Feels like she can't drink enough water.  Has swelling in her hands and feet, started Saturday.  Denies CP, SOB or dyspnea.  Having pressure in her head, feels lightheaded and dizzy when changing position and walking around.  Has been lying down most of the time.  States she took BP, was normal.  Denies urinary pain, frequency, urgency or nocturia.  Is worried -- doesn't know what's going on.  Appt. scheduled for 04/14/10. Follow-up by: Dutch Quint RN,  April 13, 2010 6:08 PM

## 2010-04-20 NOTE — Assessment & Plan Note (Signed)
Summary: Vertigo   Vital Signs:  Patient profile:   50 year old female Menstrual status:  partial hysterectomy Weight:      227.1 pounds BMI:     37.93 Temp:     97.3 degrees F oral Pulse rhythm:   regular BP sitting:   126 / 78  (left arm) Cuff size:   regular  Vitals Entered By: Levon Hedger (April 14, 2010 9:23 AM)  Nutrition Counseling: Patient's BMI is greater than 25 and therefore counseled on weight management options. CC: abdominal pain, under the navel pressure with weakness, dizziness, swelling in legs thighs and feet...needs refills on medications, Hypertension Management Is Patient Diabetic? No Pain Assessment Patient in pain? yes     Location: stomach Intensity: 4  Does patient need assistance? Functional Status Self care Ambulation Normal   CC:  abdominal pain, under the navel pressure with weakness, dizziness, swelling in legs thighs and feet...needs refills on medications, and Hypertension Management.  History of Present Illness:  Pt into the office with c/o light headed and dizziness. Started 2 days ago Only with change of position - in the bed yesterday for 18 hours but everytime she stands or changes position the dizziness return. objects in the room appear to move.  She does not feel like she is moving. Denies any upper respiratory illness prior to onset of illness  History of colon cancer 1999: last colonscopy was at least 3 years ago Pt notes that she has blood in her stool about 5 days ago.  Noted when she wiped.  Bright red blood. No formed stools since her surgery in 1999.  Multiple Sclerosis - Pt was sent to [facility]. She missed her last appt at [facility] due to finances.  Prior to going to [facility] pt was going to [facility] but again pt was not able to return for testing. There was a question about if she even has MS.  Obesity - down 17 pounds since last visit  Hypertension History:      She denies headache, chest  pain, and palpitations.  She notes no problems with any antihypertensive medication side effects.        Positive major cardiovascular risk factors include hypertension and family history for ischemic heart disease (females less than 10 years old).  Negative major cardiovascular risk factors include female age less than 44 years old and non-tobacco-user status.        Further assessment for target organ damage reveals no history of ASHD, cardiac end-organ damage (CHF/LVH), stroke/TIA, peripheral vascular disease, renal insufficiency, or hypertensive retinopathy.     Habits & Providers  Alcohol-Tobacco-Diet     Alcohol drinks/day: 0     Tobacco Status: never  Exercise-Depression-Behavior     Does Patient Exercise: no     Depression Counseling: not indicated; screening negative for depression     Drug Use: no     Seat Belt Use: 100     Sun Exposure: occasionally  Allergies (verified): No Known Drug Allergies  Review of Systems General:  Complains of fatigue; +dizziness. Eyes:  Complains of blurring. CV:  Complains of fatigue. Resp:  Denies cough. GI:  Denies abdominal pain, nausea, and vomiting. Heme:  Complains of abnormal bruising; none present at this time but has seen over the past 1-2 months bruising on her thighs and legs..  Physical Exam  General:  alert.   Head:  normocephalic.   Ears:  bil TM with bony landmarks present - clear fluid no  erythema Lungs:  normal breath sounds.   Heart:  normal rate and regular rhythm.   Abdomen:  normal bowel sounds.   Msk:  up to the exam table Neurologic:  alert & oriented X3.   Skin:  color normal.   Psych:  Oriented X3.     Impression & Recommendations:  Problem # 1:  VERTIGO (ICD-780.4) ? if due to MS perhaps due to allergy symptoms  The following medications were removed from the medication list:    Ondansetron Hcl 4 Mg Tabs (Ondansetron hcl) .Marland Kitchen... Take 1 tablet by mouth every 8 hours as needed for nausea Her updated  medication list for this problem includes:    Loratadine 10 Mg Tabs (Loratadine) ..... One tablet by mouth daily as needed for allergies    Meclizine Hcl 25 Mg Tabs (Meclizine hcl) ..... One tablet by mouth two times a day as needed for dizziness  Problem # 2:  NEOPLASM, MALIGNANT, COLON, HX OF (ICD-V10.05) needs referral to GI  History of colon cancer  Problem # 3:  OBESITY (ICD-278.00) down 17 pounds since last visit  Problem # 4:  MULTIPLE SCLEROSIS (ICD-340) ? dx  pt did not keep appt at [facility] as ordered  Problem # 5:  NEED PROPHYLACTIC VACCINATION&INOCULATION FLU (ICD-V04.81) given today  Complete Medication List: 1)  Furosemide 20 Mg Tabs (Furosemide) .... One tablet by mouth daily as needed for fluid 2)  Tramadol Hcl 50 Mg Tabs (Tramadol hcl) .... One tablet by mouth two times a day as needed for pain 3)  Provigil 200 Mg Tabs (Modafinil) .... **rx by neurology** 4)  Klor-con M20 20 Meq Cr-tabs (Potassium chloride crys cr) .... One tablet by mouth daily on the days you take lasix 5)  Copaxone 20 Mg/ml Kit (Glatiramer acetate) 6)  Dicyclomine Hcl 20 Mg Tabs (Dicyclomine hcl) .... Take 1 tablet by mouth three times a day 7)  Lisinopril-hydrochlorothiazide 10-12.5 Mg Tabs (Lisinopril-hydrochlorothiazide) .... One tablet by mouth daily 8)  Acetazolamide 250 Mg Tabs (Acetazolamide) .... Take one tablet by mouth three times a day  **rx by neurology** 9)  Ibuprofen 600 Mg Tabs (Ibuprofen) .... One tablet by mouth three times a day as needed for pain 10)  Nexium 40 Mg Cpdr (Esomeprazole magnesium) .... One tablet by mouth daily for stomach before breakfast 11)  Cyclobenzaprine Hcl 10 Mg Tabs (Cyclobenzaprine hcl) .... One tablet by mouth nightly as needed for muscles 12)  Loratadine 10 Mg Tabs (Loratadine) .... One tablet by mouth daily as needed for allergies 13)  Meclizine Hcl 25 Mg Tabs (Meclizine hcl) .... One tablet by mouth two times a day as needed for dizziness 14)   Ciprofloxacin Hcl 500 Mg Tabs (Ciprofloxacin hcl) .... One tablet by mouth two times a day for infection  Other Orders: T-Comprehensive Metabolic Panel [phone]) T-CBC w/Diff (09811-91478) T-TSH [phone]) Rapid HIV  ([address]846) T-Lipid Profile (96295-28413) T-Vitamin D 25-Hydroxy & 1,25 Dihydroxy (2440) T-Culture, Urine (10272-53664) Flu Vaccine 42yrs + (40347) Admin 1st Vaccine (42595)  Hypertension Assessment/Plan:      The patient's hypertensive risk group is category B: At least one risk factor (excluding diabetes) with no target organ damage.  Today's blood pressure is 126/78.  Her blood pressure goal is < 140/90.  Patient Instructions: 1)  History of colon cancer - You will be referred to GI for follow up.  You will be notified of the time/date of appointment. 2)  Dizziness - inner ear.  May be due to fluid behind the  ear drum.  Take loratadine 10mg  by mouth daily. 3)  May also take meclinzine 25mg  as needed for dizziness. 4)  Blood pressure - take lisinopril by mouth daily. 5)  Take the lasix only as needed.  If you take the lasix then also take potassium 6)  You will be notified of your lab results 7)  Follow up in this office for chronic issues in 1 month.   Prescriptions: CIPROFLOXACIN HCL 500 MG TABS (CIPROFLOXACIN HCL) One tablet by mouth two times a day for infection  #14 x 0   Entered and Authorized by:   Lehman Prom FNP   Signed by:   Lehman Prom FNP on 04/14/2010   Method used:   Print then Give to Patient   RxID:   [id number] MECLIZINE HCL 25 MG TABS (MECLIZINE HCL) One tablet by mouth two times a day as needed for dizziness  #30 x 0   Entered and Authorized by:   Lehman Prom FNP   Signed by:   Lehman Prom FNP on 04/14/2010   Method used:   Print then Give to Patient   RxID:   [id number] LORATADINE 10 MG TABS (LORATADINE) One tablet by mouth daily as needed for allergies  #90 x 0   Entered and Authorized by:   Lehman Prom FNP    Signed by:   Lehman Prom FNP on 04/14/2010   Method used:   Print then Give to Patient   RxID:   [id number] LISINOPRIL-HYDROCHLOROTHIAZIDE 10-12.5 MG TABS (LISINOPRIL-HYDROCHLOROTHIAZIDE) One tablet by mouth daily  #90 x 3   Entered and Authorized by:   Lehman Prom FNP   Signed by:   Lehman Prom FNP on 04/14/2010   Method used:   Print then Give to Patient   RxID:   [id number] TRAMADOL HCL 50 MG TABS (TRAMADOL HCL) One tablet by mouth two times a day as needed for pain  #60 x 0   Entered and Authorized by:   Lehman Prom FNP   Signed by:   Lehman Prom FNP on 04/14/2010   Method used:   Print then Give to Patient   RxID:   [id number] FUROSEMIDE 20 MG TABS (FUROSEMIDE) One tablet by mouth daily as needed for fluid  #30 x 1   Entered and Authorized by:   Lehman Prom FNP   Signed by:   Lehman Prom FNP on 04/14/2010   Method used:   Print then Give to Patient   RxID:   [id number] LORATADINE 10 MG TABS (LORATADINE) One tablet by mouth daily as needed for allergies  #30 x 3   Entered and Authorized by:   Lehman Prom FNP   Signed by:   Lehman Prom FNP on 04/14/2010   Method used:   Print then Give to Patient   RxID:   [id number] MECLIZINE HCL 25 MG TABS (MECLIZINE HCL) One tablet by mouth two times a day as needed for dizziness  #30 x 0   Entered and Authorized by:   Lehman Prom FNP   Signed by:   Lehman Prom FNP on 04/14/2010   Method used:   Print then Give to Patient   RxID:   [id number] TRAMADOL HCL 50 MG TABS (TRAMADOL HCL) One tablet by mouth two times a day as needed for pain  #60 x 0   Entered and Authorized by:   Lehman Prom FNP   Signed by:   Lehman Prom FNP on 04/14/2010   Method used:   Print then Give to  Patient   RxID:   [id number] FUROSEMIDE 20 MG TABS (FUROSEMIDE) One tablet by mouth daily as needed for fluid  #30 x 1   Entered and Authorized by:   Lehman Prom FNP   Signed by:    Lehman Prom FNP on 04/14/2010   Method used:   Electronically to        [facility]  [phone]* (retail)       [address]       [place], [place]  [address]       Ph: [phone] or [phone]       Fax: [phone]   RxID:   [id number] LISINOPRIL-HYDROCHLOROTHIAZIDE 10-12.5 MG TABS (LISINOPRIL-HYDROCHLOROTHIAZIDE) One tablet by mouth daily  #30 x 11   Entered and Authorized by:   Lehman Prom FNP   Signed by:   Lehman Prom FNP on 04/14/2010   Method used:   Electronically to        [facility]  [phone]* (retail)       [address]       [place], [place]  [address]       Ph: [phone] or [phone]       Fax: [phone]   RxID:   [id number]    Orders Added: 1)  Est. Patient Level III [61607] 2)  T-Comprehensive Metabolic Panel [80053-22900] 3)  T-CBC w/Diff [37106-26948] 4)  T-TSH [54627-03500] 5)  Rapid HIV  [92370] 6)  T-Lipid Profile [80061-22930] 7)  T-Vitamin D 25-Hydroxy & 1,25 Dihydroxy [8147] 8)  T-Culture, Urine [93818-29937] 9)  Flu Vaccine 37yrs + [16967] 10)  Admin 1st Vaccine [89381]   Immunizations Administered:  Influenza Vaccine # 1:    Vaccine Type: Fluvax 3+    Site: right deltoid    Mfr: GlaxoSmithKline    Dose: 0.5 ml    Route: IM    Given by: Levon Hedger    Exp. Date: 09/16/2010    Lot #: [id number]    VIS given: 10/11/09 version given April 14, 2010.  Flu Vaccine Consent Questions:    Do you have a history of severe allergic reactions to this vaccine? no    Any prior history of allergic reactions to egg and/or gelatin? no    Do you have a sensitivity to the preservative Thimersol? no    Do you have a past history of Guillan-Barre Syndrome? no    Do you currently have an acute febrile illness? no    Have you ever had a severe reaction to latex? no    Vaccine information given and explained to patient? yes    Are you currently pregnant?  no    ndc  [phone]  Immunizations Administered:  Influenza Vaccine # 1:    Vaccine Type: Fluvax 3+    Site: right deltoid    Mfr: GlaxoSmithKline    Dose: 0.5 ml    Route: IM    Given by: Levon Hedger    Exp. Date: 09/16/2010    Lot #: [id number]    VIS given: 10/11/09 version given April 14, 2010.  Prevention & Chronic Care Immunizations   Influenza vaccine: Fluvax 3+  (04/14/2010)    Tetanus booster: Not documented    Pneumococcal vaccine: Not documented  Other Screening   Pap smear: Not documented    Mammogram: Not documented   Smoking status: never  (04/14/2010)  Lipids   Total Cholesterol: Not documented  LDL: Not documented   LDL Direct: Not documented   HDL: Not documented   Triglycerides: Not documented  Hypertension   Last Blood Pressure: 126 / 78  (04/14/2010)   Serum creatinine: 0.70  (05/24/2008)   Serum potassium 4.5  (05/24/2008) CMP ordered   Self-Management Support :    Hypertension self-management support: Not documented   Nursing Instructions: Give Flu vaccine today   Laboratory Results   Urine Tests  Date/Time Received: April 14, 2010 10:32 AM   Routine Urinalysis   Color: cloudy Glucose: negative   (Normal Range: Negative) Bilirubin: small   (Normal Range: Negative) Ketone: negative   (Normal Range: Negative) Spec. Gravity: 1.025   (Normal Range: 1.003-1.035) Blood: negative   (Normal Range: Negative) pH: 5.0   (Normal Range: 5.0-8.0) Protein: negative   (Normal Range: Negative) Urobilinogen: 1.0   (Normal Range: 0-1) Nitrite: positive   (Normal Range: Negative) Leukocyte Esterace: negative   (Normal Range: Negative)

## 2010-04-26 NOTE — Letter (Signed)
Summary: Lipid Letter  [facility]  [address] [place], [place] [address]   Phone: [phone]  Fax: [phone]    04/18/2010  Misty [name] [address] [place], [place]  [address]  Dear [name]:  We have carefully reviewed your last lipid profile from 04/14/2010 and the results are noted below with a summary of recommendations for lipid management.    Cholesterol:      230    Goal: less than 200   HDL "good" Cholesterol:   962     Goal: greater than 40   LDL "bad" Cholesterol:   104     Goal: less than 100   Triglycerides:       104     Goal: less than 150    See above cholesterol results.  Your total cholesterol is high.  You should try to eat more baked instead of fried foods.     Current Medications: 1)    Furosemide 20 Mg Tabs (Furosemide) .... One tablet by mouth daily as needed for fluid 2)    Tramadol Hcl 50 Mg Tabs (Tramadol hcl) .... One tablet by mouth two times a day as needed for pain 3)    Provigil 200 Mg Tabs (Modafinil) .... **rx by neurology** 4)    Klor-con M20 20 Meq Cr-tabs (Potassium chloride crys cr) .... One tablet by mouth daily on the days you take lasix 5)    Copaxone 20 Mg/ml Kit (Glatiramer acetate) 6)    Dicyclomine Hcl 20 Mg Tabs (Dicyclomine hcl) .... Take 1 tablet by mouth three times a day 7)    Lisinopril-hydrochlorothiazide 10-12.5 Mg Tabs (Lisinopril-hydrochlorothiazide) .... One tablet by mouth daily 8)    Acetazolamide 250 Mg Tabs (Acetazolamide) .... Take one tablet by mouth three times a day  **rx by neurology** 9)    Ibuprofen 600 Mg Tabs (Ibuprofen) .... One tablet by mouth three times a day as needed for pain 10)    Nexium 40 Mg Cpdr (Esomeprazole magnesium) .... One tablet by mouth daily for stomach before breakfast 11)    Cyclobenzaprine Hcl 10 Mg Tabs (Cyclobenzaprine hcl) .... One tablet by mouth nightly as needed for muscles 12)    Loratadine 10 Mg Tabs (Loratadine) .... One tablet  by mouth daily as needed for allergies 13)    Meclizine Hcl 25 Mg Tabs (Meclizine hcl) .... One tablet by mouth two times a day as needed for dizziness 14)    Ciprofloxacin Hcl 500 Mg Tabs (Ciprofloxacin hcl) .... One tablet by mouth two times a day for infection 15)    Ergocalciferol 50000 Unit Caps (Ergocalciferol) .... One capsule by mouth weekly  If you have any questions, please call. We appreciate being able to work with you.   Sincerely,    [facility] Lehman Prom FNP

## 2010-04-26 NOTE — Letter (Signed)
Summary: Handout Printed  Printed Handout:  - Diet - Low-Cholesterol Guidelines 

## 2010-04-26 NOTE — Progress Notes (Signed)
Summary: pelvic pain  Phone Note Call from Patient   Summary of Call: PT SAYS THAT SHE IS HAVING ABD CRAMPING.... PT WANTS TO KNOW IS THERE SOMETHING SHE CAN DO.... PT SAYS THE PAIN WENT AWAY AND NOW ITS BACK... [phone] Initial call taken by: Armenia Shannon,  April 17, 2010 9:57 AM  Follow-up for Phone Call        complains of lower left pelvic pain, with shooting pains in buttocks 8/10. Denies any dysuria or vag bleeding or itching, urine has odor.  Has 7 tablets of Cipro left. Advised to take all of Cipro as ordered, drink lots of water 6-8 glasses per day. Call if continues to have problems after one wk of completion of med. Gaylyn Cheers RN  April 17, 2010 5:23 PM

## 2010-05-04 NOTE — Progress Notes (Signed)
Summary: Yeast infection  Phone Note Call from Patient   Summary of Call: pt says she is taking cipro and now she have a yeast infection... pt says she uses [facility] [address] Initial call taken by: Armenia Shannon,  April 20, 2010 2:21 PM  Follow-up for Phone Call        Rx for fluconazole sent to [facility]  **see other open phone note** Follow-up by: Lehman Prom FNP,  April 20, 2010 2:51 PM  Additional Follow-up for Phone Call Additional follow up Details #1::        pt informed of above information. Additional Follow-up by: Levon Hedger,  April 25, 2010 1:25 PM

## 2010-05-04 NOTE — Progress Notes (Signed)
Summary: Lab results  Phone Note Outgoing Call   Summary of Call: notify pt that her vitamin d level is VERY low.  she will need to start a weekly supplement for the next 12 weeks (rx in basket) she will get 4 capsules then she will need to take the bottle in for refills 2 more times. her cholesterol is also borderline high.  She should decrease the amount of fried and fatty foods that she is eating.  she will get a letter with the exact values **see other open phone note** Initial call taken by: Lehman Prom FNP,  April 18, 2010 1:59 PM  Follow-up for Phone Call        pt informed of above information.  Rx faxed to [facility] [address]. Follow-up by: Levon Hedger,  April 25, 2010 1:22 PM  New Problems: VITAMIN D DEFICIENCY (ICD-268.9)   New Problems: VITAMIN D DEFICIENCY (ICD-268.9) New/Updated Medications: ERGOCALCIFEROL 50000 UNIT CAPS (ERGOCALCIFEROL) One capsule by mouth WEEKLY FLUCONAZOLE 150 MG TABS (FLUCONAZOLE) One tablet by mouth x 1 dose Prescriptions: FLUCONAZOLE 150 MG TABS (FLUCONAZOLE) One tablet by mouth x 1 dose  #1 x 0   Entered and Authorized by:   Lehman Prom FNP   Signed by:   Lehman Prom FNP on 04/20/2010   Method used:   Electronically to        [facility]  [phone]* (retail)       [address]       [place], [place]  [address]       Ph: [phone] or [phone]       Fax: [phone]   RxID:   [id number] ERGOCALCIFEROL 50000 UNIT CAPS (ERGOCALCIFEROL) One capsule by mouth WEEKLY  #4 x 3   Entered and Authorized by:   Lehman Prom FNP   Signed by:   Lehman Prom FNP on 04/18/2010   Method used:   Printed then faxed to ...       [facility]  [address]  [phone]* (retail)       [address]       [place], [place]  [address]       Ph: [phone] or [phone]       Fax: [phone]   RxID:   [id number]

## 2010-06-12 LAB — POCT URINALYSIS DIP (DEVICE)
Glucose, UA: NEGATIVE mg/dL
Ketones, ur: 15 mg/dL — AB
Nitrite: NEGATIVE
Protein, ur: 100 mg/dL — AB
Specific Gravity, Urine: 1.025 (ref 1.005–1.030)
Urobilinogen, UA: 4 mg/dL — ABNORMAL HIGH (ref 0.0–1.0)
pH: 6 (ref 5.0–8.0)

## 2010-08-01 NOTE — Op Note (Signed)
Misty [name], Misty [name]             ACCOUNT NO.:  [id number]   MEDICAL RECORD NO.:  [id number]          PATIENT TYPE:  AMB   LOCATION:  ENDO                         FACILITY:  [facility]   PHYSICIAN:  Georgiana Spinner, M.D.    DATE OF BIRTH:  October 30, 1960   DATE OF PROCEDURE:  09/04/2007  DATE OF DISCHARGE:                               OPERATIVE REPORT   PROCEDURE:  Colonoscopy.   INDICATIONS:  Colon cancer history.   ANESTHESIA:  Fentanyl 75 mcg, Versed 7.5 mg.   PROCEDURE:  With the patient mildly sedated in the left lateral  decubitus position, the Pentax videoscopic colonoscope was inserted in  the rectum and passed under direct vision to the neo-cecum, which was  photographed.  From this point the colonoscope was slowly withdrawn  taking circumferential views of colonic mucosa, stopping only in the  rectum, which appeared normal on direct and showed hemorrhoids on  retroflexed view.  The endoscope was straightened and withdrawn.  The  patient's vital signs and pulse oximetry remained stable.  The patient  tolerated the procedure well without apparent complication.   FINDINGS:  Internal hemorrhoids otherwise an unremarkable exam.   PLAN:  See endoscopy note for further details.  We will repeat  colonoscopy in probably 5 years.           ______________________________  Georgiana Spinner, M.D.     GMO/MEDQ  D:  09/04/2007  T:  09/04/2007  Job:  [id number]

## 2010-08-01 NOTE — Op Note (Signed)
Misty [name], Misty [name]             ACCOUNT NO.:  [id number]   MEDICAL RECORD NO.:  [id number]          PATIENT TYPE:  AMB   LOCATION:  ENDO                         FACILITY:  [facility]   PHYSICIAN:  Georgiana Spinner, M.D.    DATE OF BIRTH:  1961-01-16   DATE OF PROCEDURE:  09/04/2007  DATE OF DISCHARGE:                               OPERATIVE REPORT   PROCEDURE:  Upper endoscopy.   INDICATIONS:  GERD with Barrett's esophagus.   ANESTHESIA:  Fentanyl 75 mcg, Versed 7.5 mg, Benadryl 25 mg.   DESCRIPTION OF PROCEDURE:  With the patient mildly sedated in the left  lateral decubitus position, the Pentax videoscopic endoscope was  inserted into the mouth, passed under direct vision through the  esophagus which appeared normal until we reached distal esophagus, and  there were changes of Barrett's photographed and biopsied.  We entered  into the stomach.  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  We advanced into the intestine which appeared  normal, and photograph taken.   From this point the endoscope was slowly withdrawn taking  circumferential views of the remaining small bowel, gastric, and  esophageal mucosa.  We did not try retroflex the endoscope in the  stomach.  The endoscope was withdrawn.  The patient's vital signs and  pulse oximeter remained stable.  The patient tolerated the procedure  well without apparent complication.   FINDINGS:  Barrett's esophagus biopsied.  Await biopsy report.  The  patient will call me for results, and follow up with me as needed as an  outpatient.  Proceed to colonoscopy as planned.           ______________________________  Georgiana Spinner, M.D.     GMO/MEDQ  D:  09/04/2007  T:  09/04/2007  Job:  [id number]

## 2010-08-04 NOTE — Procedures (Signed)
[facility]  Patient:    Misty [name], Misty [name] Visit Number: [id number] MRN: [id number]          Service Type: END Location: ENDO Attending Physician:  Sabino Gasser Dictated by:   Sabino Gasser, M.D. Proc. Date: 09/11/01 Admit Date:  09/11/2001                             Procedure Report  PROCEDURE:  Colonoscopy.  INDICATIONS:  Severe right-sided abdominal pain, colon cancer resected in the past, Hemoccult positive stools.  ANESTHESIA:  Demerol 100, Versed 10 mg.  DESCRIPTION OF PROCEDURE:  With the patient mildly sedated in the left lateral decubitus position, the Olympus videoscopic colonoscope was inserted into the rectum and passed under direct vision to the neocecum which was photographed. From this point, the colonoscope was slowly withdrawn, taking circumferential views of the entire colonic mucosa.  As we withdrew, the suction button became stuck and could not be operated, and so therefore we had to withdraw the colonoscope and reinsert another and pass it again to the neocecum.  And from this point, the colonoscope was slowly withdrawn, taking circumferential views of the entire colonic mucosa, stopping this time only in the rectum which appeared normal on direct and showed hemorrhoids on retroflexed view.  The endoscope was straightened and withdrawn.  The patients vital signs and pulse oximeter remained stable.  The patient tolerated the procedure well without apparent complications.  FINDINGS: 1. Internal hemorrhoids. 2. Otherwise, unremarkable colonoscopic examination to the neocecum.  Etiology    of pain unclear. 3. The patient will get a CAT scan later today and follow up with me as an    outpatient. Dictated by:   Sabino Gasser, M.D. Attending Physician:  Sabino Gasser DD:  09/11/01 TD:  09/13/01 Job: [id number]

## 2010-08-04 NOTE — Procedures (Signed)
[facility]  Patient:    Misty [name], Misty [name]                       MRN: [id number] Proc. Date: 09/25/00 Attending:  Sabino Gasser, M.D. CC:         Currie Paris, M.D.   Procedure Report  PROCEDURE:  Colonoscopy.  GASTROENTEROLOGIST:  Sabino Gasser, M.D.  INDICATIONS:  Followup for colon cancer.  ANESTHESIA:  Demerol 75 mg, Versed 6 mg.  PROCEDURE IN DETAIL:  With the patient mildly sedated and in the left lateral decubitus position, the Olympus video colonoscope was inserted in the rectum and passed under direct vision through the anastomosis into the small bowel which appeared normal.  This was photographed and biopsied.   From this point, the colonoscope was slowly withdrawn, taking circumferential views of the entire colonic mucosa, stopping in the anastomotic area where previously noted anastomosis was photographed.  From this point, the colonoscope was slowly withdrawn, taking circumferential views of the remaining colonic mucosa, stopping in the rectum which appeared normal on direct view and showed hemorrhoids on retroflexed view.  The endoscope was straightened and withdrawn.  The patients vital signs and pulse oximetry remained stable.  The patient tolerated the procedure well with no apparent complications.  FINDINGS:  Internal hemorrhoid, otherwise unremarkable colonoscopic examination to the neocecum.  PLAN:  See endoscopy note for further details. DD:  09/25/00 TD:  09/25/00 Job: [id number]

## 2010-08-04 NOTE — Discharge Summary (Signed)
[place]. [facility]  Patient:    Misty [name], Misty [name]                      MRN: [id number] Adm. Date:  [id number] Disc. Date: [id number] Attending:  Brandy Hale                           Discharge Summary  FINAL DIAGNOSES: 1. Ventral incisional hernia. 2. Asymptomatic gallstones. 3. Obesity. 4. History of carcinoma of the right colon, stage B2, no evidence of    recurrence to date.  OPERATIONS PERFORMED:  Laparoscopic ventral hernia with dual mesh.  DATE OF SURGERY:  May 18, 1999.  HISTORY:  This is a 50 year old black female with morbid obesity who is status post a right colectomy by Dr. Cicero Duck on June 07, 1997.  She had adjuvant chemotherapy, but only one or two courses because she was not compliant with her medical oncologist.  She presented recently with a six-month history of increasing problems with centralized abdominal pain.  She had had an extensive workup by Dr. Sharrell Ku and placed on Levsin, was thought to have a hernia.  She saw Dr. Jamey Ripa on April 11, 1999 and he thought she had a ventral incisional hernia just to the right of the midline. CT scan of the chest, abdomen, and pelvis, performed on April 21, 1999 showed no evidence of recurrent cancer.  Gallstones were noted but no evidence of inflammation.  There was an anterior abdominal wall hernia consistent with a periumbilical ventral hernia.  PHYSICAL EXAMINATION:  GENERAL:  Morbidly-obese black female.  NECK:  Reveals no cervical or supraclavicular adenopathy.  HEART:  Regular rate and rhythm, no murmurs, rubs, or gallops.  LUNGS:  Clear to auscultation.  ABDOMEN:  Obese, soft.  There is a well-healed right mid transverse incision and a well-healed lower midline incision which intersects at the umbilicus. When she stands up there is a moderate sized ventral hernia, about 8 cm in diameter, which is a little bit tender and mostly reducible.  Elective  hernia repair was recommended.  HOSPITAL COURSE:  On the day of admission, patient was taken to the operating room.  Under general anesthesia, she underwent laparoscopic repair of her ventral hernia with a large sheet of dual mesh.  Postoperatively, patient did reasonably well.  She had anorexia for the first 24 hours but we were able to get her up and move her around.  She developed fever to 102 over the next 24 hours, but otherwise was doing well.  Her lungs sounded fairly clear anteriorly, but with diminished breath sounds at the bases.  Her abdomen was soft and the wounds looked fine.  A workup of her fever revealed that the white count was only 9500.  Urinalysis revealed only 6-10 white cells, and chest x-ray just showed poor inspiration.  We felt that most likely her fever was due to atelectasis and we encouraged increased activity and ambulation.  On March 4, she was feeling much better, had become afebrile, wanted to go home, was tolerating a diet.  Heart rate had come down from 120 down to 92. She was passing flatus and feeling well.  Her CBC showed a white count of 8500 and her wounds looked fine.  She was discharged on May 21, 1999 doing well.  FOLLOW-UP:  She was asked to return to see me in the office in seven to ten days. DD:  06/16/99 TD:  06/17/99 Job: [id number]

## 2010-08-04 NOTE — Op Note (Signed)
   Misty [name], Misty [name]                         ACCOUNT NO.:  [id number]   MEDICAL RECORD NO.:  [id number]                   PATIENT TYPE:  AMB   LOCATION:  ENDO                                 FACILITY[facility]  [facility]   PHYSICIAN:  Georgiana Spinner, M.D.                 DATE OF BIRTH:  [birth year]   DATE OF PROCEDURE:  12/23/2002  DATE OF DISCHARGE:                                 OPERATIVE REPORT   PROCEDURE:  Colonoscopy.   INDICATIONS:  Follow-up of colon cancer.   ANESTHESIA:  1. Demerol 60 mg.  2. Versed 7 mg.   DESCRIPTION OF PROCEDURE:  With patient mildly sedated in the left lateral  decubitus position, the Olympus videoscopic colonoscope was inserted in the  rectum and passed under direct vision to the neocecum, which was  photographed.  From this point, the colonoscope was slowly withdrawn, taking  circumferential views of the colonic mucosa, stopping only in the rectum  which appeared normal on direct and showed hemorrhoids on retroflexed view.  The endoscope was straightened and withdrawn.  The patient's vital signs and  pulse oximeter remained stable.  The patient tolerated the procedure well  without apparent complications.   FINDINGS:  No evidence of recurrence of colon cancer or polyps five years  after resection.                                               Georgiana Spinner, M.D.    GMO/MEDQ  D:  12/23/2002  T:  12/23/2002  Job:  [id number]

## 2010-08-04 NOTE — Op Note (Signed)
Misty [name], Misty [name]             ACCOUNT NO.:  [id number]   MEDICAL RECORD NO.:  [id number]          PATIENT TYPE:  AMB   LOCATION:  ENDO                         FACILITY:  [facility]   PHYSICIAN:  Georgiana Spinner, M.D.    DATE OF BIRTH:  17-Oct-1960   DATE OF PROCEDURE:  03/05/2005  DATE OF DISCHARGE:                                 OPERATIVE REPORT   PROCEDURE:  Upper endoscopy with biopsy.   INDICATIONS:  Gastroesophageal reflux disease with Barrett's.   ANESTHESIA:  Demerol 50, Versed 5 mg.   DESCRIPTION OF PROCEDURE:  With the patient mildly sedated in the left  lateral decubitus position,  the Olympus videoscopic endoscope was inserted  in the mouth, passed under direct vision through the esophagus which  appeared normal until we reached the distal esophagus and there were changes  of Barrett's photographed and biopsied. We entered into the stomach and  there was just a remnant after bypass surgery has been done.  There is a  superior opening which showed lead to the small intestine and we advanced  the endoscope quite distal in this and it appeared normal. We pulled back  into the stomach and there was a second opening which ended in a blind loop  that was photographed at its termination. The endoscope was then pulled back  into the stomach remnant and withdrawn taking circumferential views of the  remaining gastric and esophageal mucosa. The patient's vital signs and pulse  oximeter remained stable. The patient tolerated the procedure well without  apparent complications.   FINDINGS:  Barrett's esophagus status post gastric bypass surgery.   PLAN:  Await biopsy report. The patient will call me for results and follow-  up with me as an outpatient.           ______________________________  Georgiana Spinner, M.D.     GMO/MEDQ  D:  03/05/2005  T:  03/06/2005  Job:  [id number]

## 2010-08-04 NOTE — Op Note (Signed)
NAMEADRIJANA, Misty [name]             ACCOUNT NO.:  [id number]   MEDICAL RECORD NO.:  [id number]          PATIENT TYPE:  AMB   LOCATION:  ENDO                         FACILITY:  [facility]   PHYSICIAN:  Georgiana Spinner, M.D.    DATE OF BIRTH:  [birth year]   DATE OF PROCEDURE:  05/30/2004  DATE OF DISCHARGE:                                 OPERATIVE REPORT   PROCEDURE PERFORMED:  Colonoscopy.   ENDOSCOPIST:  Georgiana Spinner, M.D.   INDICATIONS FOR PROCEDURE:  Colon cancer.   ANESTHESIA:  Demerol 100 mg, Versed 7 mg.   DESCRIPTION OF PROCEDURE:  With the patient mildly sedated in the left  lateral decubitus position, the Olympus videoscopic colonoscope was inserted  in the rectum and passed under direct vision to the neocecum, which was  photographed.  From this point the colonoscope was slowly withdrawn taking  circumferential views of the colonic mucosa stopping only in the rectum  which appeared normal on direct and showed hemorrhoids on retroflex view.  The endoscope was straightened and withdrawn.  The patient's vital signs and  pulse oximeter remained stable.  The patient tolerated the procedure well  without apparent complications.   FINDINGS:  Internal hemorrhoids.  Otherwise unremarkable examination to the  neocecum.   PLAN:  No evidence of recurrent cancer intraluminally.  Follow-up will be  with Dr. Gershon Crane concerning bariatric surgery.      GMO/MEDQ  D:  05/30/2004  T:  05/30/2004  Job:  [id number]   cc:   Dr. Maisie Fus L. Gershon Crane

## 2010-08-04 NOTE — Op Note (Signed)
[place]. [facility]  Patient:    Misty [name], Misty [name]                      MRN: [id number] Proc. Date: 05/18/99 Adm. Date:  [id number] Attending:  Brandy Hale CC:         Ammie Dalton, M.D.             Lowell C. Catha Gosselin, M.D.                           Operative Report  PREOPERATIVE DIAGNOSIS:  Incarcerated ventral hernia.  POSTOPERATIVE DIAGNOSIS:  Incarcerated ventral hernia.  OPERATION PERFORMED:  Laparoscopic repair of incarcerated ventral hernia with Dual mesh.  SURGEON:  Angelia Mould. Derrell Lolling, M.D.  FIRST ASSISTANT:  Sandria Bales. Ezzard Standing, M.D.  ANESTHESIA:  OPERATIVE INDICATIONS:  This is a 50 year old black female who is operated upon  electively for an incarcerated ventral hernia.  She had a cesarean section through a lower midline incision in the past.  She underwent right colon resection by Currie Paris, M.D. in March of 1999.  She had a stage B-II colon cancer nd has had some chemotherapy, but not a full course of chemotherapy.  Over the past six months, she has been having increasing problems with centralized abdominal pain.  A CT scan of the chest, abdomen and pelvis performed in February of 2001, shows no evidence of recurrent or metastatic disease, but there was an anterior  abdominal wall hernia consistent with a large periumbilical ventral hernia.  On examination, she has a moderate size ventral hernia, about 6-8 cm in size, which is a little bit tender and mostly reducible.  She was brought to the operating oom electively for ventral herniorrhaphy.  Because of her morbid obesity, we are going to try to do this laparoscopically.  DESCRIPTION OF PROCEDURE:  Following the induction of general endotracheal anesthesia, a Foley catheter was inserted.  Oral gastric tube was inserted. The abdomen was then prepped and draped in a sterile fashion.  A short 2.0 cm length transverse incision was made in the left upper  quadrant.  Exposure of the external oblique fascia was exposed, and then the external oblique fascia was elevated and incised.  We traversed the abdominal wall in a muscle splitting fashion.  We elected the peritoneum and made a small incision, and opened this up under direct vision, and entered the abdomen under direct vision.  There were no adhesions in this area.  The 10 mm Hussan trocar was inserted through this site, and secured  with a pursestring suture of 0 Vicryl in the fascia.  Pneumoperitoneum was created. A video camera was inserted.  We found that she had complex hernia, centered around the umbilicus and the right transverse abdominal incision.  The hernia defect was about 8 cm in diameter or  more, and there were multiple chambers to this.  There was a great deal of omentum incarcerated in the hernia, but there was no intestine incarcerated in the hernia. There was no evidence of recurrent cancer.  The liver looked normal.  Small and  large intestine looked normal.  We ultimately placed a 5 mm trocar in the left lower quadrant, 5 cm trocar in the right upper quadrant, and a 5 mm trocar in the right lower quadrant.  We carefully took all the adhesions down, exposing the rim of the hernia defect.  We carefully teased the omentum out  of the omental sac.  Some of this was done with blunt dissection and some with sharp dissection.  Smaller bleeders were controlled with cautery.  Once we had all the omentum out, we further dissected the adhesions of the omentum away from the right lateral abdominal wall, though we had gained excellent exposure for about 8 cm in all directions around the hernia defect.  We used spinal needles to mark the dimensions of the hernia, and then drew an image on the abdominal wall, at least 4-5 cm bigger in all directions of the hernia defect.  We brought a sheet of antibiotic impregnated Dual mesh to the operative field, and we cut  an oblong, somewhat elliptical shaped piece of mesh that was transverse in orientation, and it was about 25 cm transversely, and almost 20 cm vertically.  We then marked the mesh in eight separate areas with a marking pen. In each of these eight separate areas, we placed sutures of 0 Novofil, and tied  these down, and cut them about 8 cm in length.  We rolled the mesh up into a long cylinder, and inserted it through the 10 mm trocar in the left upper quadrant. We then reinserted the camera and instruments, and unrolled the mesh very carefully. We were careful to orient the mesh in all of the eight marked sites.  We were very careful to place the smooth brown surface of the mesh toward the bowel, and a white _______ surface of the mesh against the anterior abdominal wall peritoneal surface.  In each of the eight premarked sites, we made a small incision and inserted the  _________ suture device, bringing the Novofil sutures out through the wound. We were very careful to make sure that we got good wide fascial bites, at least 1 m or more in width.  After all eight of these sutures were withdrawn from the abdominal cavity, they  were then tied down into the depths of the puncture wound, securing the mesh in the abdomen in all eight areas.  Inspection of this fixation showed the mesh to be spread out fairly nicely.  We used a 5 mm tacking device to take the edges of the mesh circumferentially.  We also placed these tacks about 1.5 cm apart, all the way around the rim of the mesh, and then we also placed some as an inner circle, about 3 cm inside the outer edge of the mesh.  We probably fired about 45 of the 5 mm  tacks into the mesh.  Each of these with counter pressure against the abdominal  wall with our hand.  After this was done, we inspected the fixation of the mesh and felt that it was  quite good.  There was no redundancy or openings in the mesh whatsoever.   We inspected the omentum and there was no active bleeding.  We removed the trocars  under direct vision and there was no bleeding.  We released pneumoperitoneum. he  fascia in the left upper quadrant was closed with 3-0 Vicryl suture.  All the incisions were irrigated with saline and closed either with Steri-Strips or skin staples.  A clean bandage was placed and the patient taken to the recovery room in stable  condition.  Estimated blood loss was about 50 cc.  Complications none.  Sponge,  needle, and instrument counts were correct. DD:  05/18/99 TD:  05/19/99 Job: [id number]

## 2010-08-04 NOTE — Op Note (Signed)
[name], [name]             ACCOUNT NO.:  [id number]   MEDICAL RECORD NO.:  [id number]          PATIENT TYPE:  AMB   LOCATION:  ENDO                         FACILITY:  [facility]   PHYSICIAN:  Georgiana Spinner, M.D.    DATE OF BIRTH:  08/10/1960   DATE OF PROCEDURE:  06/05/2006  DATE OF DISCHARGE:                               OPERATIVE REPORT   OPERATION/PROCEDURE:  Colonoscopy.   INDICATIONS:  Colon cancer.   ANESTHESIA:  Demerol 50 mg,  Versed 7 mg.   PROCEDURE:  With the patient mildly sedated in the left lateral  decubitus position, the Pentax videoscopic colonoscope was inserted in  the rectum and passed under direct vision to the neocecum which was  photographed.  From this point the colonoscope was slowly withdrawn  taking circumferential views of colonic mucosa, stopping only in the  rectum which appeared normal on direct.  Showed hemorrhoids on  retroflexed view.  The endoscope was straightened and withdrawn.  The  patient's vital signs, pulse oximeter remained stable.  The patient  tolerated procedure well without apparent complications.   FINDINGS:  1. Internal hemorrhoids.  2. Status post right hemicolectomy for colon cancer.   PLAN:  Repeat examination probably in 5 years           ______________________________  Georgiana Spinner, M.D.     GMO/MEDQ  D:  06/05/2006  T:  06/05/2006  Job:  [id number]

## 2010-08-04 NOTE — Procedures (Signed)
[facility]  Patient:    Misty [name], Misty [name]                      MRN: [id number] Proc. Date: 09/25/00 Attending:  Sabino Gasser, M.D.                           Procedure Report  PROCEDURE:  Upper endoscopy.  INDICATIONS FOR PROCEDURE:  Abdominal pain, Barretts esophagus.  ANESTHESIA:  Demerol 50, Versed 6 mg.  DESCRIPTION OF PROCEDURE:  With the patient mildly sedated in the left lateral decubitus position, the Olympus videoscopic endoscope was inserted in the mouth and passed under direct vision through the esophagus. There was a question of Barretts esophagus seen, photographed, and biopsied. We entered into the stomach. The fundus, body, antrum, duodenal bulb, and second portion of the duodenum all appeared normal. From this point, the endoscope was slowly withdrawn taking circumferential views of the entire duodenal mucosa until the endoscope was then pulled back in the stomach and placed in retroflexion to view the stomach from below and a hiatal hernia was seen and photographed. The endoscope was straightened and withdrawn taking circumferential views of the remaining gastric and esophageal mucosa which otherwise appeared normal. The patients vital signs and pulse oximeter remained stable. The patient tolerated the procedure well without apparent complications.  FINDINGS:  Barretts esophagus, unremarkable otherwise.  PLAN:  Await biopsy report. The patient will call me for results and followup with me in as an outpatient. Proceed to colonoscopy. DD:  09/25/00 TD:  09/25/00 Job: [id number]

## 2010-08-04 NOTE — Procedures (Signed)
[place]. [facility]  Patient:    Misty [name], Misty [name]                      MRN: [id number] Proc. Date: 07/19/99 Adm. Date:  [id number] Attending:  Sabino Gasser                           Procedure Report  PROCEDURE:  Upper endoscopy.  ENDOSCOPIST:  Sabino Gasser, M.D.  INDICATIONS:  Abdominal pain.  ANESTHESIA:  Demerol 100 mg and Versed 10 mg was given intravenously in divided  dose.  DESCRIPTION OF PROCEDURE:  With the patient mildly sedated in the left lateral decubitus position, the Olympus videoscopic endoscope was inserted in the mouth and passed under direct vision through the esophagus.  The distal esophagus was approached which showed the areas of proximal Barretts esophagus, photographed nd biopsied.  We advanced into the stomach.  The fundus, body, and antrum all appeared normal, as did the duodenal bulb and second portion of the duodenum. Photographs were taken.  From this point, the endoscope was slowly withdrawn, taking circumferential views of the entire duodenal mucosa until the endoscope had been pulled back into the stomach, and placed on retroflexion to view the stomach from below.  This to appeared normal and was photographed.  The endoscope was straightened and pulled back once again from distal to proximal stomach, taking  circumferential views of the entire gastric and subsequently esophageal mucosa which otherwise appeared normal.  The patients vital signs and pulse oximeter remained stable.  The patient tolerated the procedure well without apparent complications.  FINDINGS:  Question of distal esophagitis, await biopsy report.  Otherwise, unremarkable examination.  Proceed with colonoscopy. DD:  07/19/99 TD:  07/20/99 Job: [id number]

## 2010-08-04 NOTE — Op Note (Signed)
   Misty, [name]                         ACCOUNT NO.:  [id number]   MEDICAL RECORD NO.:  [id number]                   PATIENT TYPE:  AMB   LOCATION:  ENDO                                 FACILITY:  [facility]   PHYSICIAN:  Georgiana Spinner, M.D.                 DATE OF BIRTH:  09-22-1960   DATE OF PROCEDURE:  03/27/2002  DATE OF DISCHARGE:                                 OPERATIVE REPORT   PROCEDURE:  Upper endoscopy.   INDICATIONS:  Abdominal pain.   ANESTHESIA:  Demerol 70 mg, Versed 7 mg.   DESCRIPTION OF PROCEDURE:  With the patient mildly sedated in the left  lateral decubitus position, the Olympus videoscopic endoscope was inserted  in the mouth and passed under direct vision through the esophagus, which  appeared normal until we reached the distal esophagus, and there was a  question of short-segment Barrett's that was photographed and biopsied.  We  entered into the stomach.  Fundus, body, antrum, duodenal bulb, second  portion of duodenum appeared normal.  From this point the endoscope was  slowly withdrawn, taking circumferential views of the entire duodenal mucosa  until the endoscope had been pulled back into the stomach, placed in  retroflexion to view the stomach from below.  The endoscope was straightened  and withdrawn, taking circumferential views of the remaining gastric and  esophageal mucosa.  The patient's vital signs and pulse oximetry remained  stable.  The patient tolerated the procedure well without apparent  complications.   FINDINGS:  1. Short-segment Barrett's esophagus.  2. Incomplete wrap of the esophagogastric junction around the endoscope,     leading to reflux.   PLAN:  Continue present therapy.  The patient is improved.  The patient will  follow up with me as an outpatient.                                               Georgiana Spinner, M.D.    GMO/MEDQ  D:  03/27/2002  T:  03/27/2002  Job:  [id number]

## 2010-08-04 NOTE — Procedures (Signed)
[place]  Patient:    Misty [name], Misty [name]                      MRN: [id number] Proc. Date: 07/19/99 Adm. Date:  [id number] Attending:  Sabino Gasser                           Procedure Report  PROCEDURE:  Colonoscopy.  ENDOSCOPIST:  Sabino Gasser, M.D.  INDICATIONS:  Colon cancer.  ANESTHESIA:  None given.  DESCRIPTION OF PROCEDURE:  With the patient mildly sedated in the left lateral decubitus position, the Olympus videoscopic pediatric colonoscope was inserted n the rectum and passed under direct vision to the cecum which was photographed.  From this point, the colonoscope was then slowly withdrawn, taking circumferential views of the entire colonic mucosa, stopping to biopsy randomly, and normal-appearing mucosa because of diarrhea, and then stopping in the rectum which appeared normal.  Direct view showed internal hemorrhoids on retroflexed view. The endoscope was straightened and withdrawn.  The patients vital signs and pulse oximeter remained stable.  The patient tolerated the procedure well without apparent complications.  FINDINGS:  Internal hemorrhoids, otherwise unremarkable examination to _____ the cecum, await biopsy report for evaluation of diarrhea.  The patient will call me for results and follow up with me as an outpatient. DD:  07/19/99 TD:  07/20/99 Job: [id number]

## 2010-08-04 NOTE — Op Note (Signed)
Misty [name], Misty [name]             ACCOUNT NO.:  [id number]   MEDICAL RECORD NO.:  [id number]          PATIENT TYPE:  AMB   LOCATION:  ENDO                         FACILITY:  [facility]   PHYSICIAN:  Georgiana Spinner, M.D.    DATE OF BIRTH:  [birth year]   DATE OF PROCEDURE:  06/05/2006  DATE OF DISCHARGE:                               OPERATIVE REPORT   PROCEDURE:  Upper endoscopy.   INDICATIONS:  GERD.   ANESTHESIA:  Demerol 80 mg, Versed 10 mg.   PROCEDURE:  With the patient mildly sedated in the left lateral  decubitus position, the Pentax videoscopic endoscope was inserted in the  mouth and passed under direct vision through the esophagus, which  appeared normal until we reached the distal esophagus and there were  changes of Barrett's photographed and biopsied.  We entered into the  stomach and the stomach pouch was viewed and photographed.  We advanced  to the small bowel, which appeared normal.  From this point the  endoscope was then slowly withdrawn taking circumferential views of the  small bowel, gastric and esophageal mucosa.  The patient's vital signs  and pulse oximetry remained stable.  The patient tolerated the procedure  well without apparent complications.   FINDINGS:  Status post gastric bypass with Barrett's esophagus,  biopsied.   Await biopsy report.  The patient will call me for results and follow up  with me as an outpatient.  Proceed to colonoscopy.           ______________________________  Georgiana Spinner, M.D.     GMO/MEDQ  D:  06/05/2006  T:  06/05/2006  Job:  [id number]

## 2010-08-04 NOTE — Cardiovascular Report (Signed)
Misty [name], Misty [name]                         ACCOUNT NO.:  [id number]   MEDICAL RECORD NO.:  [id number]                   PATIENT TYPE:  OIB   LOCATION:  2871                                 FACILITY:  [facility]   PHYSICIAN:  Darlin Priestly, M.D.             DATE OF BIRTH:  09-12-1960   DATE OF PROCEDURE:  09/30/2002  DATE OF DISCHARGE:  09/30/2002                              CARDIAC CATHETERIZATION   PROCEDURES:  1. Left heart catheterization.  2. Coronary angiography.  3. Left ventriculogram.  4. Abdominal aortogram.   COMPLICATIONS:  None.   INDICATIONS:  Misty [name] is a 50 year old female patient of Louanna Raw,  M.D., with a history of ongoing dyspnea on exertion, chest pain, and left  arm pain.  She also has a history of colon cancer, status post resection and  chemotherapy.  She is now referred for cardiac catheterization to assess her  coronary status.   DESCRIPTION OF PROCEDURE:  After giving informed consent, the patient was  brought to the cardiac catheterization lab, and the right and left groin  were shaved, prepped, and draped in the usual sterile fashion.  Infusion  line was established.  Using modified Seldinger technique, a #6 French  arterial sheath was inserted in the right femoral artery.  Six French  diagnostic catheters were used to perform diagnostic angiography.   1. This reveals a large left main with no significant disease.  2. The LAD is a large vessel that courses to the apex with one diagonal     branch.  The LAD has no significant disease.  The first diagonal is a     medium-sized vessel which bifurcates in the midsegment and has no     significant disease.  3. Left circumflex is a large vessel that courses to the A-V and gives rise     to two obtuse marginal branches.  The A-V groove circumflex has no     significant disease.  The first __________ is a large vessel that     bifurcates in its distal segment and has no significant disease.   The     second __________ is a small vessel with no significant disease.  4. The right coronary artery is a large vessel which is dominant and give     rise to a PDA as well as a posterolateral branch.  There is no     significant in the RCA, PDA, or posterolateral branch.   Left ventriculogram reveals low normal EF of 45-50%.   Abdominal aortogram reveals no evidence of renal artery stenosis.   HEMODYNAMICS:  Systemic arterial pressure 147/92, LV systemic pressure  141/21, LVEDP of 24.   CONCLUSION:  1. No significant coronary artery disease.  2. Low normal ejection fraction.  3.     No evidence of renal artery stenosis.  4. Systemic hypertension.  5. Elevated left ventricular end-diastolic pressure.  Darlin Priestly, M.D.    RHM/MEDQ  D:  09/30/2002  T:  10/01/2002  Job:  [id number]  Louanna Raw  [address]  [place]  [place] [address]  Fax: [phone]   cc:   Louanna Raw  [address]  [place]  [place] [address]  Fax: [phone]

## 2010-10-31 ENCOUNTER — Inpatient Hospital Stay (INDEPENDENT_AMBULATORY_CARE_PROVIDER_SITE_OTHER)
Admission: RE | Admit: 2010-10-31 | Discharge: 2010-10-31 | Disposition: A | Discharge status: Home / Self Care | Payer: Self-pay | Source: Ambulatory Visit | Attending: Emergency Medicine | Admitting: Emergency Medicine

## 2010-10-31 DIAGNOSIS — L259 Unspecified contact dermatitis, unspecified cause: Secondary | ICD-10-CM

## 2010-10-31 DIAGNOSIS — L0390 Cellulitis, unspecified: Secondary | ICD-10-CM

## 2010-10-31 DIAGNOSIS — L0291 Cutaneous abscess, unspecified: Secondary | ICD-10-CM

## 2010-12-09 ENCOUNTER — Emergency Department (HOSPITAL_COMMUNITY): Payer: Self-pay

## 2010-12-09 ENCOUNTER — Inpatient Hospital Stay (HOSPITAL_COMMUNITY)
Admission: EM | Admit: 2010-12-09 | Discharge: 2010-12-10 | DRG: 313 | Disposition: A | Discharge status: Home / Self Care | Payer: Self-pay | Attending: Cardiology | Admitting: Cardiology

## 2010-12-09 DIAGNOSIS — I10 Essential (primary) hypertension: Secondary | ICD-10-CM | POA: Diagnosis present

## 2010-12-09 DIAGNOSIS — Z833 Family history of diabetes mellitus: Secondary | ICD-10-CM

## 2010-12-09 DIAGNOSIS — Z7982 Long term (current) use of aspirin: Secondary | ICD-10-CM

## 2010-12-09 DIAGNOSIS — Z79899 Other long term (current) drug therapy: Secondary | ICD-10-CM

## 2010-12-09 DIAGNOSIS — Z85038 Personal history of other malignant neoplasm of large intestine: Secondary | ICD-10-CM

## 2010-12-09 DIAGNOSIS — I951 Orthostatic hypotension: Secondary | ICD-10-CM | POA: Diagnosis present

## 2010-12-09 DIAGNOSIS — Z9884 Bariatric surgery status: Secondary | ICD-10-CM

## 2010-12-09 DIAGNOSIS — F17200 Nicotine dependence, unspecified, uncomplicated: Secondary | ICD-10-CM | POA: Diagnosis present

## 2010-12-09 DIAGNOSIS — E669 Obesity, unspecified: Secondary | ICD-10-CM | POA: Diagnosis present

## 2010-12-09 DIAGNOSIS — K589 Irritable bowel syndrome without diarrhea: Secondary | ICD-10-CM | POA: Diagnosis present

## 2010-12-09 DIAGNOSIS — K219 Gastro-esophageal reflux disease without esophagitis: Secondary | ICD-10-CM | POA: Diagnosis present

## 2010-12-09 DIAGNOSIS — Z8249 Family history of ischemic heart disease and other diseases of the circulatory system: Secondary | ICD-10-CM

## 2010-12-09 DIAGNOSIS — M79609 Pain in unspecified limb: Secondary | ICD-10-CM

## 2010-12-09 DIAGNOSIS — G35 Multiple sclerosis: Secondary | ICD-10-CM | POA: Diagnosis present

## 2010-12-09 DIAGNOSIS — R0789 Other chest pain: Principal | ICD-10-CM | POA: Diagnosis present

## 2010-12-09 LAB — URINALYSIS, ROUTINE W REFLEX MICROSCOPIC
Bilirubin Urine: NEGATIVE
Glucose, UA: NEGATIVE mg/dL
Hgb urine dipstick: NEGATIVE
Ketones, ur: NEGATIVE mg/dL
Nitrite: NEGATIVE
Protein, ur: NEGATIVE mg/dL
Specific Gravity, Urine: 1.021 (ref 1.005–1.030)
Urobilinogen, UA: 1 mg/dL (ref 0.0–1.0)
pH: 5.5 (ref 5.0–8.0)

## 2010-12-09 LAB — DIFFERENTIAL
Basophils Absolute: 0 10*3/uL (ref 0.0–0.1)
Basophils Relative: 0 % (ref 0–1)
Eosinophils Absolute: 0.1 10*3/uL (ref 0.0–0.7)
Eosinophils Relative: 1 % (ref 0–5)
Lymphocytes Relative: 22 % (ref 12–46)
Lymphs Abs: 1.8 10*3/uL (ref 0.7–4.0)
Monocytes Absolute: 0.4 10*3/uL (ref 0.1–1.0)
Monocytes Relative: 5 % (ref 3–12)
Neutro Abs: 6 10*3/uL (ref 1.7–7.7)
Neutrophils Relative %: 72 % (ref 43–77)

## 2010-12-09 LAB — COMPREHENSIVE METABOLIC PANEL
ALT: 25 U/L (ref 0–35)
AST: 26 U/L (ref 0–37)
Albumin: 4 g/dL (ref 3.5–5.2)
Alkaline Phosphatase: 70 U/L (ref 39–117)
BUN: 14 mg/dL (ref 6–23)
CO2: 30 mEq/L (ref 19–32)
Calcium: 9.6 mg/dL (ref 8.4–10.5)
Chloride: 98 mEq/L (ref 96–112)
Creatinine, Ser: 0.55 mg/dL (ref 0.50–1.10)
GFR calc Af Amer: >60 mL/min (ref >60)
GFR calc non Af Amer: >60 mL/min (ref >60)
Glucose, Bld: 121 mg/dL — ABNORMAL HIGH (ref 70–99)
Potassium: 3.6 mEq/L (ref 3.5–5.1)
Sodium: 136 mEq/L (ref 135–145)
Total Bilirubin: 0.3 mg/dL (ref 0.3–1.2)
Total Protein: 7.9 g/dL (ref 6.0–8.3)

## 2010-12-09 LAB — CBC
HCT: 38.1 % (ref 36.0–46.0)
Hemoglobin: 12.5 g/dL (ref 12.0–15.0)
MCH: 33.5 pg (ref 26.0–34.0)
MCHC: 32.8 g/dL (ref 30.0–36.0)
MCV: 102.1 fL — ABNORMAL HIGH (ref 78.0–100.0)
Platelets: 249 10*3/uL (ref 150–400)
RBC: 3.73 MIL/uL — ABNORMAL LOW (ref 3.87–5.11)
RDW: 13.6 % (ref 11.5–15.5)
WBC: 8.3 10*3/uL (ref 4.0–10.5)

## 2010-12-09 LAB — CK TOTAL AND CKMB (NOT AT ARMC)
CK, MB: 1.9 ng/mL (ref 0.3–4.0)
CK, MB: 1.8 ng/mL (ref 0.3–4.0)
Relative Index: 1.6 (ref 0.0–2.5)
Relative Index: INVALID (ref 0.0–2.5)
Total CK: 122 U/L (ref 7–177)
Total CK: 93 U/L (ref 7–177)

## 2010-12-09 LAB — D-DIMER, QUANTITATIVE: D-Dimer, Quant: <0.22 ug/mL-FEU (ref 0.00–0.48)

## 2010-12-09 LAB — URINE MICROSCOPIC-ADD ON

## 2010-12-09 LAB — POCT I-STAT TROPONIN I: Troponin i, poc: 0 ng/mL (ref 0.00–0.08)

## 2010-12-09 LAB — TROPONIN I: Troponin I: <0.3 ng/mL (ref <0.30)

## 2010-12-10 DIAGNOSIS — M79609 Pain in unspecified limb: Secondary | ICD-10-CM

## 2010-12-10 LAB — CARDIAC PANEL(CRET KIN+CKTOT+MB+TROPI)
CK, MB: 1.7 ng/mL (ref 0.3–4.0)
CK, MB: 1.5 ng/mL (ref 0.3–4.0)
Relative Index: INVALID (ref 0.0–2.5)
Relative Index: INVALID (ref 0.0–2.5)
Total CK: 76 U/L (ref 7–177)
Total CK: 83 U/L (ref 7–177)
Troponin I: <0.3 ng/mL (ref <0.30)
Troponin I: <0.3 ng/mL (ref <0.30)

## 2010-12-10 LAB — BASIC METABOLIC PANEL
BUN: 12 mg/dL (ref 6–23)
CO2: 27 mEq/L (ref 19–32)
Calcium: 8.7 mg/dL (ref 8.4–10.5)
Chloride: 103 mEq/L (ref 96–112)
Creatinine, Ser: 0.51 mg/dL (ref 0.50–1.10)
GFR calc Af Amer: >60 mL/min (ref >60)
GFR calc non Af Amer: >60 mL/min (ref >60)
Glucose, Bld: 143 mg/dL — ABNORMAL HIGH (ref 70–99)
Potassium: 3 mEq/L — ABNORMAL LOW (ref 3.5–5.1)
Sodium: 139 mEq/L (ref 135–145)

## 2010-12-10 LAB — TSH: TSH: 1.002 u[IU]/mL (ref 0.350–4.500)

## 2010-12-12 LAB — CBC
HCT: 32.3 — ABNORMAL LOW
HCT: 33.1 — ABNORMAL LOW
Hemoglobin: 10.9 — ABNORMAL LOW
Hemoglobin: 11.4 — ABNORMAL LOW
MCHC: 33.8
MCHC: 34.5
MCV: 97.1
MCV: 97.3
Platelets: 214
Platelets: 266
RBC: 3.32 — ABNORMAL LOW
RBC: 3.41 — ABNORMAL LOW
RDW: 14.6
RDW: 15.6 — ABNORMAL HIGH
WBC: 11.1 — ABNORMAL HIGH
WBC: 5.7

## 2010-12-12 LAB — URINALYSIS, ROUTINE W REFLEX MICROSCOPIC
Glucose, UA: NEGATIVE
Glucose, UA: NEGATIVE
Hgb urine dipstick: NEGATIVE
Hgb urine dipstick: NEGATIVE
Leukocytes, UA: NEGATIVE
Nitrite: NEGATIVE
Nitrite: NEGATIVE
Protein, ur: 30 — AB
Protein, ur: NEGATIVE
Specific Gravity, Urine: 1.028
Specific Gravity, Urine: 1.028
Urobilinogen, UA: 0.2
Urobilinogen, UA: 0.2
pH: 5.5
pH: 6

## 2010-12-12 LAB — COMPREHENSIVE METABOLIC PANEL
ALT: 15
ALT: 20
AST: 16
AST: 17
Albumin: 3 — ABNORMAL LOW
Albumin: 3.3 — ABNORMAL LOW
Alkaline Phosphatase: 68
Alkaline Phosphatase: 76
BUN: 5 — ABNORMAL LOW
BUN: 7
CO2: 23
CO2: 24
Calcium: 8.9
Calcium: 9
Chloride: 107
Chloride: 107
Creatinine, Ser: 0.63
Creatinine, Ser: 0.69
GFR calc Af Amer: >60
GFR calc Af Amer: >60
GFR calc non Af Amer: >60
GFR calc non Af Amer: >60
Glucose, Bld: 108 — ABNORMAL HIGH
Glucose, Bld: 109 — ABNORMAL HIGH
Potassium: 3.2 — ABNORMAL LOW
Potassium: 3.4 — ABNORMAL LOW
Sodium: 138
Sodium: 138
Total Bilirubin: 0.8
Total Bilirubin: 1.1
Total Protein: 6.4
Total Protein: 6.9

## 2010-12-12 LAB — OVA AND PARASITE EXAMINATION

## 2010-12-12 LAB — DIFFERENTIAL
Basophils Absolute: 0
Basophils Absolute: 0.1
Basophils Relative: 1
Basophils Relative: 1
Eosinophils Absolute: 0.1
Eosinophils Absolute: 0.1
Eosinophils Relative: 1
Eosinophils Relative: 2
Lymphocytes Relative: 18
Lymphocytes Relative: 7 — ABNORMAL LOW
Lymphs Abs: 0.7
Lymphs Abs: 1
Monocytes Absolute: 0.1
Monocytes Absolute: 0.5
Monocytes Relative: 1 — ABNORMAL LOW
Monocytes Relative: 9
Neutro Abs: 10.1 — ABNORMAL HIGH
Neutro Abs: 4
Neutrophils Relative %: 71
Neutrophils Relative %: 91 — ABNORMAL HIGH

## 2010-12-12 LAB — URINE MICROSCOPIC-ADD ON

## 2010-12-12 LAB — STOOL CULTURE

## 2010-12-12 LAB — CLOSTRIDIUM DIFFICILE EIA: C difficile Toxins A+B, EIA: NEGATIVE

## 2010-12-12 LAB — LIPASE, BLOOD: Lipase: 13

## 2010-12-15 ENCOUNTER — Telehealth: Payer: Self-pay | Admitting: Cardiology

## 2010-12-15 NOTE — Telephone Encounter (Signed)
Had another episode a few hours ago.  Got dizzy and broke out in a cold sweat.  Hands feel numb and swollen.   Bad pain in her neck.  Larey Seat about 6 weeks ago and hit her head and neck.  Went to [facility] but wasn't seen because they were too busy and she didn't want to wait.  Would like an appt to follow up in the office.  No pain now.  Hospital information states pt is to have a stress test with Tereso Newcomer.  Will place order and have pt called with an appointment.  phone call was disconnected while I was speaking with the pt and her voice mail has not been set up yet when I tried to call her back.

## 2010-12-15 NOTE — Telephone Encounter (Signed)
Calling patient to set up an appt - after hour voice mail. C/o left shoulder pain,hand tingling unable to make a fist. Blood pressure elevated 154/87.

## 2010-12-22 ENCOUNTER — Inpatient Hospital Stay (INDEPENDENT_AMBULATORY_CARE_PROVIDER_SITE_OTHER)
Admission: RE | Admit: 2010-12-22 | Discharge: 2010-12-22 | Disposition: A | Discharge status: Home / Self Care | Payer: Self-pay | Source: Ambulatory Visit | Attending: Family Medicine | Admitting: Family Medicine

## 2010-12-22 ENCOUNTER — Ambulatory Visit (INDEPENDENT_AMBULATORY_CARE_PROVIDER_SITE_OTHER): Payer: Self-pay

## 2010-12-22 DIAGNOSIS — G542 Cervical root disorders, not elsewhere classified: Secondary | ICD-10-CM

## 2010-12-22 DIAGNOSIS — M542 Cervicalgia: Secondary | ICD-10-CM

## 2010-12-28 NOTE — H&P (Signed)
Misty [name], Misty [name]             ACCOUNT NO.:  [id number]  MEDICAL RECORD NO.:  [id number]  LOCATION:  [facility]                         FACILITY:  [facility]  PHYSICIAN:  Rollene Rotunda, MD, FACCDATE OF BIRTH:  Aug 07, 1960  DATE OF ADMISSION:  12/09/2010 DATE OF DISCHARGE:                             HISTORY & PHYSICAL   PRIMARY:  [facility].  CARDIOLOGIST:  None.  REASON FOR PRESENTATION:  The patient with arm pain.  HISTORY OF PRESENT ILLNESS:  The patient is a 50 year old.  Her past cardiac history includes only catheterization with no coronary disease in 2004.  She has had no recent cardiovascular symptoms.  She says she did fall down some stairs about 10 days ago.  Apparently she struck her buttocks and back and perhaps her neck.  She thought she was recovering from this.  However, walking up the stairs on Thursday, she noticed that her left upper shoulder would be pounding.  Most prominently she was bothered by stinging in her left hand and arm.  She had some right arm tingling.  She thought that these symptoms get worse when she tried to be active.  She thought her heart was pounding.  She did not describe any presyncope or syncope.  She did not describe substernal chest pressure.  There was no nausea, vomiting, or shortness of breath.  She has not had PND or orthopnea.  She though she had some diaphoresis.  She came to the emergency room today because this was recurrent over the last 3 days.  The chest pounding or shoulder pounding she described would come somewhat intermittently.  She is very vague about her symptoms.  She does not think she has ever had this before.  She is having some discomfort with movement moving on the bed in the emergency room.  Her EKG was normal.  First set of CK-MB was normal.  PAST MEDICAL HISTORY:  Colon cancer, cholelithiasis, gastroesophageal reflux disease, obesity.  PAST SURGICAL HISTORY:  C-section, tonsillectomy, right knee  surgery, resection of colon cancer, gastric bypass, ventral incisional hernia repair.  ALLERGIES/INTOLERANCES:  None.  MEDICATIONS: 1. Lisinopril/HCT 10/12.5. 2. Lasix 10 mg daily. 3. Potassium 20 mEq daily. 4. Aspirin.  SOCIAL HISTORY:  She lives with her son and daughter.  She takes care of her grandchildren.  She smokes occasionally.  FAMILY HISTORY:  Contributory for mother having hypertension and diabetes and her father having lower extremity amputation.  REVIEW OF SYSTEMS:  As stated in the HPI, negative for all other systems.  PHYSICAL EXAMINATION:  GENERAL:  The patient is in no distress. VITAL SIGNS:  Blood pressure 123/66, heart rate 71 and regular, afebrile, respiratory rate 19 (orthostatic blood pressure drop upon standing from 136/62 seated to 116/64 standing without a rise in her heart rate. HEENT:  Eyes unremarkable.  Pupils equal, round, and reactive to light. Fundi not visualized.  Oral mucosa unremarkable. NECK:  No jugular distention at 45 degrees.  Carotid upstroke brisk and symmetrical.  No bruits, thyromegaly.LYMPHATICS:  No cervical, axillary, inguinal adenopathy. LUNGS:  Clear to auscultation bilaterally. BACK:  No costovertebral tenderness. CHEST:  Unremarkable. HEART:  PMI not displaced or sustained, S1 and S2 within normal limits, no S3, no  S4, no clicks, no rubs, no murmurs. ABDOMEN:  Obese, positive bowel sounds normal in frequency and pitch, no bruits, no rebound, no midline pulsatile mass.  No hepatomegaly or splenomegaly. SKIN:  No rashes, no nodules. EXTREMITIES:  Pulses 2+ throughout, no edema, no cyanosis, no clubbing. NEURO:  Oriented to person, place, and time, cranial nerves II-XII grossly intact, motor grossly intact.  EKG:  Sinus rhythm, rate 71, axis within normal limits, intervals within normal limits, no acute ST-T wave changes.  CHEST X-RAY:  No acute disease.  WBC 8.3, hemoglobin 12.5, platelets 249,000.  Sodium 136,  potassium 3.6, BUN 14, creatinine 0.55.  ASSESSMENT/PLAN: 1. Chest:  The patient's chest and arm discomfort is very atypical.     She did not have coronary disease albeit her cath was 8 years ago.     She really does not have significant cardiovascular risk factors.     At this point, she could be ruled out overnight.  If her enzymes     and EKG are normal, I would not suggest inpatient cardiovascular     testing would be needed.  Exercise treadmill test as an outpatient     would be reasonable in that situation.  I will check a D-dimer,     though the pretest probability of pulmonary embolism is very low. 2. Orthostatic hypotension.  The patient was orthostatic in the ER,     though the heart rate did not go up.  I will hydrate her gently     overnight and this can be repeated in the morning.  She should     ambulate before discharge, watching her heart rhythm. 3. Obesity.  She has had gastric bypass.  She should be continued to     be counseled on diet and exercise. 4. Hypertension.  For now hold her Lasix and hydrochlorothiazide but I     would restart her meds upon discharge.  She mentions that she does     not have any prescription renewals.     Rollene Rotunda, MD, [facility]     JH/MEDQ  D:  12/09/2010  T:  12/09/2010  Job:  [id number]  Electronically Signed by Rollene Rotunda MD [facility] on 12/28/2010 01:38:03 PM

## 2011-01-04 ENCOUNTER — Ambulatory Visit (INDEPENDENT_AMBULATORY_CARE_PROVIDER_SITE_OTHER): Payer: Self-pay | Admitting: Family Medicine

## 2011-01-04 VITALS — BP 162/97 | Ht 66.0 in | Wt 216.0 lb

## 2011-01-04 DIAGNOSIS — M5412 Radiculopathy, cervical region: Secondary | ICD-10-CM

## 2011-01-04 MED ORDER — PREDNISONE 10 MG PO KIT: PACK | ORAL | Status: DC

## 2011-01-04 MED ORDER — GABAPENTIN 300 MG PO CAPS: 300.0000 mg | ORAL_CAPSULE | Freq: Every day | ORAL | Status: DC

## 2011-01-04 NOTE — Patient Instructions (Signed)
Take Sterapred dose pack Take neurontin at bed time Moist heat twice a day Physical therapy.

## 2011-01-04 NOTE — Progress Notes (Signed)
Subjective:    Patient ID: Misty [name], female    DOB: 09/08/1960, 50 y.o.   MRN: [id number]  HPI  Misty [name] is a pleasant 50 yo female patient complaining of neck pain radiated to her left arm and hand for the last 4 weeks after she felt from her feet. She has been seen in the ER and in the urgent care twice. She had x ray of her C spine which are negatives. She was treated with a prednisone pack about a month ago with mild relieve. She states the her pain is 5/10, sharp, radiated to her left upper extremities, numbness and tingling on her arm and hand. No weakness. She is having hard time to sleep at night. She has had neck pain in the past but nothing like this.    Patient Active Problem List  Diagnoses  . OBESITY  . MULTIPLE SCLEROSIS  . HYPERTENSION, BENIGN ESSENTIAL  . BARRETTS ESOPHAGUS  . IRRITABLE BOWEL SYNDROME  . OSTEOARTHRITIS, KNEES, BILATERAL  . BACK PAIN WITH RADICULOPATHY  . MEDIAL MENISCUS TEAR, LEFT  . SPRAIN&STRAIN OTHER SPECIFIED SITES HIP&THIGH  . NEOPLASM, MALIGNANT, COLON, HX OF  . BARIATRIC SURGERY STATUS  . ACUTE CYSTITIS  . VERTIGO  . FATIGUE  . VITAMIN D DEFICIENCY     Allergies no known allergies  History   Social History  . Marital Status: Single    Spouse Name: N/A    Number of Children: N/A  . Years of Education: N/A   Social History Main Topics  . Smoking status: Not on file  . Smokeless tobacco: Not on file  . Alcohol Use: Not on file  . Drug Use: Not on file  . Sexually Active: Not on file   Other Topics Concern  . Not on file   Social History Narrative  . No narrative on file    No current outpatient prescriptions on file prior to visit.   Current Outpatient Prescriptions on File Prior to Visit  Medication Sig Dispense Refill  . aspirin 81 MG tablet Take 81 mg by mouth daily.        Marland Kitchen lisinopril-hydrochlorothiazide (PRINZIDE,ZESTORETIC) 10-12.5 MG per tablet Take 1 tablet by mouth daily.            Review of  Systems  Constitutional: Negative for fever, chills, diaphoresis and fatigue.  Musculoskeletal: Negative for back pain, joint swelling and gait problem.  Neurological: Positive for numbness. Negative for tremors and weakness.       Objective:   Physical Exam  Constitutional: She is oriented to person, place, and time. She appears well-developed and well-nourished.       BP 162/97  Ht 5\' 6"  (1.676 m)  Wt 216 lb (97.977 kg)  BMI 34.86 kg/m2   Neck: Normal range of motion. Neck supple.  Pulmonary/Chest: Effort normal.  Musculoskeletal:       Neck  with intact skin. No swelling, no hematomas. Decrease ROM  for flexion, extension, rotation and lateralization with pain at the extremes.  Tenderness to palpation in the posterior aspect of her neck. TTP in the medial border of the right scapula Strength 5/5 for shoulder flexion and extension, 5/5 for elbow flexion and extension, 5/5 for wrist flexion and extension B/L. DTR biceps,triceps and brachioradialis II/IV B/L. Sensation intact distally B/L.    Neurological: She is alert and oriented to person, place, and time.  Skin: Skin is warm. No rash noted. No erythema. No pallor.  Psychiatric: She has a normal mood and  affect. Her behavior is normal.          Assessment & Plan:   1. Radiculopathy of cervical spine  PredniSONE (STERAPRED DS 12 DAY) 10 MG KIT, gabapentin (NEURONTIN) 300 MG capsule, Ambulatory referral to Physical Therapy   Take Sterapred dose pack Take neurontin at bed time Moist heat twice a day Physical therapy. F/U in 4 weeks. If not better then, she would need a MRI of C spine

## 2011-01-18 ENCOUNTER — Ambulatory Visit: Payer: Self-pay | Attending: Family Medicine

## 2011-01-18 DIAGNOSIS — M2569 Stiffness of other specified joint, not elsewhere classified: Secondary | ICD-10-CM | POA: Insufficient documentation

## 2011-01-18 DIAGNOSIS — IMO0001 Reserved for inherently not codable concepts without codable children: Secondary | ICD-10-CM | POA: Insufficient documentation

## 2011-01-18 DIAGNOSIS — R293 Abnormal posture: Secondary | ICD-10-CM | POA: Insufficient documentation

## 2011-01-18 DIAGNOSIS — R5381 Other malaise: Secondary | ICD-10-CM | POA: Insufficient documentation

## 2011-01-18 DIAGNOSIS — M542 Cervicalgia: Secondary | ICD-10-CM | POA: Insufficient documentation

## 2011-01-18 NOTE — Discharge Summary (Signed)
  Misty [name], Misty [name]             ACCOUNT NO.:  [id number]  MEDICAL RECORD NO.:  [id number]  LOCATION:  3714                         FACILITY:  [facility]  PHYSICIAN:  Hillis Range, MD       DATE OF BIRTH:  18-Jan-1961  DATE OF ADMISSION:  12/09/2010 DATE OF DISCHARGE:  12/10/2010                              DISCHARGE SUMMARY   ADDENDUM   While I was reviewing the patient's discharge instructions with her, she made repeated requests for something for her nerves as she stated that the tingling was coming back as soon as she sat up.  She stated that the tingling and the stress that it caused her were significantly affecting her and she requested a short-term prescription or nerve pill.  I advised her to call [facility] as soon as possible as we would not refill this.  She was given a prescription for Xanax 0.25 mg, #10, no refills.  All other discharge medications and instructions are unchanged.     Theodore Demark, PA-C ______________________________ Hillis Range, MD    RB/MEDQ  D:  12/10/2010  T:  12/10/2010  Job:  [id number]  Electronically Signed by Theodore Demark PA-C on 12/25/2010 06:49:13 AM Electronically Signed by Hillis Range MD on 01/18/2011 02:30:10 PM

## 2011-01-18 NOTE — Discharge Summary (Signed)
  Misty [name], Misty [name]             ACCOUNT NO.:  [id number]  MEDICAL RECORD NO.:  [id number]  LOCATION:  3714                         FACILITY:  [facility]  PHYSICIAN:  Hillis Range, MD       DATE OF BIRTH:  08-11-1960  DATE OF ADMISSION:  12/09/2010 DATE OF DISCHARGE:  12/10/2010                              DISCHARGE SUMMARY   PROCEDURE:  Two-view chest x-ray.  PRIMARY FINAL DISCHARGE DIAGNOSIS:  Left arm and shoulder pain, possible anginal equivalent.  SECONDARY DIAGNOSES: 1. History of colon cancer, status post right colectomy in 1999. 2. History of ventral hernia repair. 3. Status post esophagogastroduodenoscopy and colonoscopy. 4. History of chest pain in 2004 with catheterization showing no     significant coronary artery disease and ejection fraction of 45-     50%. 5. History of gastroesophageal reflux disease with Barrett esophagus. 6. History of cholelithiasis. 7. Status post C-section, tonsillectomy, right knee surgery, and     gastric bypass. 8. History of tobacco use.  TIME AT DISCHARGE:  31 minutes.  HOSPITAL COURSE:  Misty [name] is a 50 year old female with no previous history of coronary artery disease.  She had left shoulder and arm pain and came to the hospital where she was admitted for further evaluation.  Her cardiac enzymes were negative for MI.  Her CBC was within normal limits except for an MCV elevated at 102.1 and RBCs slightly low at 3.73.  Her TSH was within normal limits.  Her blood sugars were mildly elevated at 121 and a fasting blood sugar of 143.  On December 10, 2010, her potassium was 3.0 and was supplemented.  A chest x-ray showed no acute disease and the D-dimer was less than 0.22.  On December 10, 2010, Misty [name] was seen by Dr. Johney Frame.  She was ambulating without chest pain or shortness of breath and considered stable for discharge, to follow up as an outpatient.  DISCHARGE INSTRUCTIONS: 1. Her activity level is to be increased  gradually. 2. She is encouraged to stick to a low-sodium, heart-healthy, low-     carbohydrate diet. 3. She is to get a stress test and then follow up with Tereso Newcomer in     our office. 4. She is to get a BMET at that office visit to follow up for     hypokalemia. 5. She is to follow up with [facility] for her elevated blood sugars.  DISCHARGE MEDICATIONS: 1. Lisinopril 10 mg is discontinued. 2. Lisinopril/hydrochlorothiazide 10/12.5 mg a day. 3. Monistat OTC b.i.d. p.r.n. 4. Lasix 20 mg is discontinued. 5. Aspirin 81 mg a day. 6. Ibuprofen p.r.n. as prior to admission. 7. Potassium 20 mEq a day.     Theodore Demark, PA-C   ______________________________ Hillis Range, MD    RB/MEDQ  D:  12/10/2010  T:  12/10/2010  Job:  [id number]  cc:   Clinic [facility]  Electronically Signed by Theodore Demark PA-C on 12/25/2010 06:48:46 AM Electronically Signed by Hillis Range MD on 01/18/2011 02:30:03 PM

## 2011-01-22 ENCOUNTER — Ambulatory Visit: Payer: Self-pay | Admitting: Physical Therapy

## 2011-01-25 ENCOUNTER — Ambulatory Visit: Payer: Self-pay | Admitting: Physical Therapy

## 2011-01-30 ENCOUNTER — Ambulatory Visit: Payer: Self-pay

## 2011-02-01 ENCOUNTER — Ambulatory Visit: Payer: Self-pay

## 2011-02-06 ENCOUNTER — Ambulatory Visit: Payer: Self-pay | Admitting: Physical Therapy

## 2011-02-12 ENCOUNTER — Emergency Department (INDEPENDENT_AMBULATORY_CARE_PROVIDER_SITE_OTHER)
Admission: EM | Admit: 2011-02-12 | Discharge: 2011-02-12 | Disposition: A | Discharge status: Home / Self Care | Payer: Self-pay | Source: Home / Self Care | Attending: Emergency Medicine | Admitting: Emergency Medicine

## 2011-02-12 DIAGNOSIS — L0291 Cutaneous abscess, unspecified: Secondary | ICD-10-CM

## 2011-02-12 DIAGNOSIS — M545 Low back pain, unspecified: Secondary | ICD-10-CM

## 2011-02-12 DIAGNOSIS — L259 Unspecified contact dermatitis, unspecified cause: Secondary | ICD-10-CM

## 2011-02-12 DIAGNOSIS — L0390 Cellulitis, unspecified: Secondary | ICD-10-CM

## 2011-02-12 HISTORY — DX: Essential (primary) hypertension: I10

## 2011-02-12 HISTORY — DX: Multiple sclerosis: G35

## 2011-02-12 HISTORY — DX: Malignant neoplasm of colon, unspecified: C18.9

## 2011-02-12 MED ORDER — METHYLPREDNISOLONE ACETATE 80 MG/ML IJ SUSP
INTRAMUSCULAR | Status: AC
  Filled 2011-02-12: qty 1

## 2011-02-12 MED ORDER — CEPHALEXIN 500 MG PO CAPS: 500.0000 mg | ORAL_CAPSULE | Freq: Three times a day (TID) | ORAL | Status: AC

## 2011-02-12 MED ORDER — PREDNISONE 10 MG PO TABS: ORAL_TABLET | ORAL | Status: AC

## 2011-02-12 MED ORDER — CLOBETASOL PROPIONATE 0.05 % EX SOLN: Freq: Two times a day (BID) | CUTANEOUS | Status: DC

## 2011-02-12 MED ORDER — HYDROCODONE-ACETAMINOPHEN 5-325 MG PO TABS: ORAL_TABLET | ORAL | Status: AC

## 2011-02-12 MED ORDER — METHYLPREDNISOLONE ACETATE PF 80 MG/ML IJ SUSP
80.0000 mg | Freq: Once | INTRAMUSCULAR | Status: AC
  Administered 2011-02-12: 80 mg via INTRAMUSCULAR

## 2011-02-12 NOTE — ED Notes (Signed)
C/o rash to face, itchy scalp that is burning and draining since Saturday.  Reports she used a hair product that caused a similar reaction before.  Also c/o pain in lower back for 3 days.  States she is currently in physical therapy for pain related to a fall.

## 2011-02-12 NOTE — ED Provider Notes (Signed)
History     CSN: [id number] Arrival date & time: 02/12/2011  3:41 PM   First MD Initiated Contact with Patient 02/12/11 1550      Chief Complaint  Patient presents with  . Rash  . Back Pain    (Consider location/radiation/quality/duration/timing/severity/associated sxs/prior treatment) HPI Comments: Ameya was here 3 months ago with identical symptoms. She had used a hair dye and immediately thereafter developed itching and inflammation of the scalp, crusting and drainage from the scalp, and a facial rash. She was treated with IM, by mouth, and topical steroids and antibiotics. Her symptoms gradually resolved. 3 days ago she use the same hair dye again, even though she has been told not to, and she developed the identical symptoms. She has again facial rash, scalp irritation and itching, crusting, and drainage. She denies any fever, chills, sweats, or difficulty breathing.  She also has had lower back pain without radiation. She fell and injured her back 4 months ago and was seen here and ultimately referred to [facility]. She's been on physical therapy and Neurontin, but it's not holding her pain.  Patient is a 50 y.o. female presenting with rash and back pain.  Rash   Back Pain  Pertinent negatives include no fever.    Past Medical History  Diagnosis Date  . Hypertension   . MS (multiple sclerosis)   . Colon cancer     Past Surgical History  Procedure Date  . Abdominal hysterectomy   . Colon surgery   . Cesarean section   . Foot surgery     History reviewed. No pertinent family history.  History  Substance Use Topics  . Smoking status: Never Smoker   . Smokeless tobacco: Not on file  . Alcohol Use: Yes     occasional    OB History    Grav Para Term Preterm Abortions TAB SAB Ect Mult Living                  Review of Systems  Constitutional: Negative for fever and chills.  Musculoskeletal: Positive for back pain.  Skin:  Positive for rash. Negative for color change, pallor and wound.    Allergies  Review of patient's allergies indicates no known allergies.  Home Medications   Current Outpatient Rx  Name Route Sig Dispense Refill  . GABAPENTIN 300 MG PO CAPS Oral Take 1 capsule (300 mg total) by mouth at bedtime. 60 capsule 0  . LISINOPRIL-HYDROCHLOROTHIAZIDE 10-12.5 MG PO TABS Oral Take 1 tablet by mouth daily.      . ASPIRIN 81 MG PO TABS Oral Take 81 mg by mouth daily.      . CEPHALEXIN 500 MG PO CAPS Oral Take 1 capsule (500 mg total) by mouth 3 (three) times daily. 30 capsule 0  . CLOBETASOL PROPIONATE 0.05 % EX SOLN Topical Apply topically 2 (two) times daily. 50 mL 0  . HYDROCODONE-ACETAMINOPHEN 5-325 MG PO TABS Oral Take 1 tablet by mouth every 6 (six) hours as needed.      Marland Kitchen HYDROCODONE-ACETAMINOPHEN 5-325 MG PO TABS  1 to 2 tabs every 4 to 6 hours as needed for pain. 20 tablet 0  . NAPROXEN 500 MG PO TABS Oral Take 500 mg by mouth 2 (two) times daily with a meal.      . PREDNISONE 10 MG PO TABS  Take 4 tabs daily for 4 days, 3 tabs daily for 4 days, 2 tabs daily for 4 days, then 1  tab daily for 4 days.  Take all tabs at one time with food and preferably in the morning except for the first dose. 40 tablet 0  . PREDNISONE 10 MG PO KIT  Follow package recommendations. 1 kit 0    BP 154/80  Pulse 66  Temp 99.3 F (37.4 C)  Resp 16  SpO2 99%  Physical Exam  Nursing note and vitals reviewed. Constitutional: She appears well-developed and well-nourished. No distress.  Cardiovascular: Normal rate, regular rhythm, normal heart sounds and intact distal pulses.   Pulmonary/Chest: Effort normal and breath sounds normal.  Musculoskeletal: She exhibits tenderness.       Exam of her back reveals tenderness to palpation in the lower lumbar spine and in the paravertebral muscles at the midline and diminished range of motion with pain.  Skin: Skin is warm and dry. Rash noted. No abrasion, no bruising, no  ecchymosis and no lesion noted. She is not diaphoretic. No erythema. No pallor.       Exam of her skin reveals an erythematous, maculopapular rash on the face. Her scalp looks fairly good today and is better than when she was here last saw her. There was no crusting no drainage. She only has minimal erythema. Skin is otherwise clear.    ED Course  Procedures (including critical care time)  The patient was given the following meds in the [facility]:   Medications  predniSONE (DELTASONE) 10 MG tablet (not administered)  cephALEXin (KEFLEX) 500 MG capsule (not administered)  clobetasol (TEMOVATE) 0.05 % external solution (not administered)  HYDROcodone-acetaminophen (NORCO) 5-325 MG per tablet (not administered)  methylPREDNISolone acetate PF (DEPO-MEDROL) injection 80 mg (80 mg Intramuscular Given 02/12/11 1637)     And had the following response:  No allergic reaction.  Labs Reviewed - No data to display No results found.   1. Contact dermatitis   2. Cellulitis   3. Low back pain       MDM  She again has a reaction to the same hair dye that she used last time. She was again told not to use that dye ever again. I also told her that the reaction next time might be a lot worse.  I also suggested she discussed pain management issues with her orthopedic surgeon.        Roque Lias, MD 02/12/11 [phone]

## 2011-02-22 ENCOUNTER — Ambulatory Visit: Payer: Self-pay | Attending: Family Medicine | Admitting: Physical Therapy

## 2011-02-22 DIAGNOSIS — M542 Cervicalgia: Secondary | ICD-10-CM | POA: Insufficient documentation

## 2011-02-22 DIAGNOSIS — R293 Abnormal posture: Secondary | ICD-10-CM | POA: Insufficient documentation

## 2011-02-22 DIAGNOSIS — IMO0001 Reserved for inherently not codable concepts without codable children: Secondary | ICD-10-CM | POA: Insufficient documentation

## 2011-02-22 DIAGNOSIS — M2569 Stiffness of other specified joint, not elsewhere classified: Secondary | ICD-10-CM | POA: Insufficient documentation

## 2011-02-22 DIAGNOSIS — R5381 Other malaise: Secondary | ICD-10-CM | POA: Insufficient documentation

## 2011-02-27 ENCOUNTER — Emergency Department (HOSPITAL_COMMUNITY)
Admission: EM | Admit: 2011-02-27 | Discharge: 2011-02-27 | Disposition: A | Discharge status: Home / Self Care | Payer: Self-pay

## 2011-02-28 ENCOUNTER — Ambulatory Visit: Payer: Self-pay

## 2011-03-01 ENCOUNTER — Ambulatory Visit: Payer: Self-pay

## 2011-03-05 ENCOUNTER — Ambulatory Visit: Payer: Self-pay

## 2011-03-09 ENCOUNTER — Encounter: Payer: Self-pay | Admitting: Physical Therapy

## 2011-03-30 ENCOUNTER — Emergency Department (HOSPITAL_COMMUNITY)
Admission: EM | Admit: 2011-03-30 | Discharge: 2011-03-30 | Disposition: A | Discharge status: Home / Self Care | Payer: Self-pay | Source: Home / Self Care | Attending: Emergency Medicine | Admitting: Emergency Medicine

## 2011-03-30 ENCOUNTER — Encounter (HOSPITAL_COMMUNITY): Payer: Self-pay | Admitting: Emergency Medicine

## 2011-03-30 DIAGNOSIS — M549 Dorsalgia, unspecified: Secondary | ICD-10-CM

## 2011-03-30 HISTORY — DX: Dorsalgia, unspecified: M54.9

## 2011-03-30 MED ORDER — LORAZEPAM 2 MG/ML IJ SOLN
INTRAMUSCULAR | Status: AC
  Filled 2011-03-30: qty 1

## 2011-03-30 MED ORDER — HYDROCODONE-ACETAMINOPHEN 5-325 MG PO TABS
2.0000 | ORAL_TABLET | ORAL | Status: AC | PRN
Start: 2011-03-30 — End: 2011-04-09

## 2011-03-30 MED ORDER — LORAZEPAM 2 MG/ML IJ SOLN
1.0000 mg | Freq: Once | INTRAMUSCULAR | Status: AC
  Administered 2011-03-30: 1 mg via INTRAMUSCULAR

## 2011-03-30 MED ORDER — METHOCARBAMOL 500 MG PO TABS: 500.0000 mg | ORAL_TABLET | Freq: Four times a day (QID) | ORAL | Status: AC

## 2011-03-30 MED ORDER — NAPROXEN 500 MG PO TABS: 500.0000 mg | ORAL_TABLET | Freq: Two times a day (BID) | ORAL | Status: AC

## 2011-03-30 NOTE — ED Notes (Signed)
C/O LOWER RIGHT BACK FLARE UP RADIATING TO MIDBACK AND TO LEFT SIDE THAT STARTED YESTERDAY.C/O CONSTANT,SHARP STABBING  PAIN THAT WORSENS WITH STANDING OR SITTING.PT S/P FALL AND BACK INJURY SEEN BY SPORTS MED AND PT BUT STATES WITH MULTIPLE XRAYS  AND STEROID THERAPY NOTHING IS WORKING.PT WAS SEEN LAST WEEK AT [facility] AND URINE CX NEGATIVE.PT APPEARS IN PAIN AND UNABLE TO SIT

## 2011-03-30 NOTE — ED Provider Notes (Signed)
History     CSN: [id number]  Arrival date & time 03/30/11  1143   First MD Initiated Contact with Patient 03/30/11 1158      Chief Complaint  Patient presents with  . Back Pain     HPI Comments: Pt with acute onset of right sided thoracic and lumbar spine described as muscle spasm starting yesterday. Pain constant, sharp. States has had similar pain flares since sustaining fall approx 6 months ago. Has been tried on nsaids, steroid, narcotics, muscle relaxants. flexaril has helped in past somewhat. Was seen at [facility] several times for this, referred to [facility] sports medicine clinic, which pt states she went to, was referred to and completed PT. No urinary c/o. States has had multiple neg ua's, ucx, and had an MRI for this back pain ordered by her PMD at [facility] within the past month.  Patient is a 51 y.o. female presenting with back pain. The history is provided by the patient.  Back Pain  This is a recurrent problem. The current episode started yesterday. The problem occurs constantly. The problem has not changed since onset.The pain is associated with no known injury. The pain is present in the thoracic spine and lumbar spine. The quality of the pain is described as cramping. The pain does not radiate. The symptoms are aggravated by bending, twisting and certain positions. Pertinent negatives include no chest pain, no fever, no numbness, no weight loss, no headaches, no abdominal pain, no abdominal swelling, no bowel incontinence, no perianal numbness, no bladder incontinence, no dysuria, no leg pain, no paresthesias, no paresis, no tingling and no weakness. She has tried nothing for the symptoms.    Past Medical History  Diagnosis Date  . Hypertension   . MS (multiple sclerosis)   . Colon cancer   . Back pain     Past Surgical History  Procedure Date  . Abdominal hysterectomy   . Colon surgery   . Cesarean section   . Foot surgery     History reviewed. No pertinent family  history.  History  Substance Use Topics  . Smoking status: Never Smoker   . Smokeless tobacco: Not on file  . Alcohol Use: Yes     occasional    OB History    Grav Para Term Preterm Abortions TAB SAB Ect Mult Living                  Review of Systems  Constitutional: Negative for fever and weight loss.  Respiratory: Negative for cough, shortness of breath and wheezing.   Cardiovascular: Negative for chest pain.  Gastrointestinal: Negative for nausea, vomiting, abdominal pain and bowel incontinence.  Genitourinary: Negative for bladder incontinence and dysuria.  Musculoskeletal: Positive for back pain. Negative for gait problem.  Skin: Negative for rash.  Neurological: Negative for tingling, weakness, numbness, headaches and paresthesias.    Allergies  Review of patient's allergies indicates no known allergies.  Home Medications   Current Outpatient Rx  Name Route Sig Dispense Refill  . ASPIRIN 81 MG PO TABS Oral Take 81 mg by mouth daily.      Marland Kitchen LISINOPRIL-HYDROCHLOROTHIAZIDE 10-12.5 MG PO TABS Oral Take 1 tablet by mouth daily.      Marland Kitchen HYDROCODONE-ACETAMINOPHEN 5-325 MG PO TABS Oral Take 2 tablets by mouth every 4 (four) hours as needed for pain. 16 tablet 0  . METHOCARBAMOL 500 MG PO TABS Oral Take 1 tablet (500 mg total) by mouth 4 (four) times daily. 40 tablet 0  .  NAPROXEN 500 MG PO TABS Oral Take 1 tablet (500 mg total) by mouth 2 (two) times daily. 20 tablet 0    BP 153/80  Pulse 87  Temp(Src) 98.2 F (36.8 C) (Oral)  Resp 22  SpO2 99%  Physical Exam  Nursing note and vitals reviewed. Constitutional: She is oriented to person, place, and time. She appears well-developed and well-nourished.       Appears to be in mod painful distress. Bending forward, leaning on table.   HENT:  Head: Normocephalic and atraumatic.  Eyes: Conjunctivae and EOM are normal.  Neck: Normal range of motion.  Cardiovascular: Normal rate, regular rhythm and normal heart sounds.     Pulmonary/Chest: Effort normal and breath sounds normal.  Abdominal: Soft. Bowel sounds are normal. She exhibits no distension. There is no tenderness. There is no rebound, no guarding and no CVA tenderness.  Musculoskeletal: Normal range of motion.       Arms:      Tenderness, muscle spasm right lower thoracic, paralumbar area.  Pain with going from lying to standing, torso rotation. No rash.  Bilateral lower extremities nontender, baseline ROM with intact  PT pulses. No pain with PROM hips bilaterally. SLR neg bilaterally. Sensation baseline light touch bilaterally for Pt, DTR's symmetric and intact bilaterally KJ, Motor symmetric bilateral 5/5 hip flexion, quadriceps, hamstrings, EHL, foot dorsiflexion, foot plantarflexion, gait somewhat antalgic but without apparent new ataxia.   Neurological: She is alert and oriented to person, place, and time.  Skin: Skin is warm and dry.  Psychiatric: She has a normal mood and affect. Her behavior is normal. Judgment and thought content normal.    ED Course  Procedures (including critical care time)  Labs Reviewed - No data to display No results found.   1. Back pain       MDM  [place] narcotic database queried. Previous chart, labs, imaging reviewed. No record of MRI in pacs.  Got 4 rx for narcotics in 2012- all from [facility] providers. Last narcotic rx in late Nov 2012.  Pt appears to be in mod amt of pain and has significant muscle spasm. Giving atrivan 1 mg IM for spasm.  States she had mri done by pmd at [facility] 1 month ago. Unable to find record of this in PACS.  No evidence of uti, nephrolithiasis. Pt states multiple ua's, ucx done and all have been negative. No evidence of spinal cord involvement based on H&P. Pt describing typical back pain, has been <6 week duration. No red flags such as fevers, age >81, h/o trauma with bony tenderness, neurological deficits, bladder/ bowel incontinence, h/o CA, unexplained weight loss, pain worse at night,   h/o prolonged steroid use, h/o osteopenia, h/o IVDU. Imaging not indicated at this time.    Luiz Blare, MD 03/30/11 1756

## 2011-05-15 ENCOUNTER — Emergency Department (HOSPITAL_COMMUNITY)
Admission: EM | Admit: 2011-05-15 | Discharge: 2011-05-15 | Disposition: A | Discharge status: Home / Self Care | Payer: Self-pay | Source: Home / Self Care | Attending: Family Medicine | Admitting: Family Medicine

## 2011-05-15 ENCOUNTER — Encounter (HOSPITAL_COMMUNITY): Payer: Self-pay

## 2011-05-15 DIAGNOSIS — R05 Cough: Secondary | ICD-10-CM

## 2011-05-15 DIAGNOSIS — R059 Cough, unspecified: Secondary | ICD-10-CM

## 2011-05-15 DIAGNOSIS — J310 Chronic rhinitis: Secondary | ICD-10-CM

## 2011-05-15 MED ORDER — GUAIFENESIN-CODEINE 100-10 MG/5ML PO SYRP: 5.0000 mL | ORAL_SOLUTION | Freq: Three times a day (TID) | ORAL | Status: AC | PRN

## 2011-05-15 MED ORDER — FLUTICASONE PROPIONATE 50 MCG/ACT NA SUSP: 2.0000 | Freq: Every day | NASAL | Status: DC

## 2011-05-15 NOTE — ED Provider Notes (Signed)
History     CSN: [id number]  Arrival date & time 05/15/11  1022   First MD Initiated Contact with Patient 05/15/11 1112      Chief Complaint  Patient presents with  . Cough  . Headache    (Consider location/radiation/quality/duration/timing/severity/associated sxs/prior treatment) HPI Comments: Misty [name] presents for evaluation of productive cough, and headache and nasal congestion since Saturday. She denies any body aches. She does report fever and chills and has been alternating ibuprofen with acetaminophen for this. She's also been taking Mucinex over the counter as well as Tylenol Cold and flu without much relief. She also reports a history of hypertension and pseudotumor cerebri. She denies any chest pain or shortness of breath. Today. Her blood pressure is elevated.  Patient is a 51 y.o. female presenting with cough and headaches. The history is provided by the patient.  Cough This is a new problem. The current episode started more than 2 days ago. The problem occurs constantly. The problem has not changed since onset.The cough is productive of sputum. Associated symptoms include chills, headaches and rhinorrhea. Pertinent negatives include no chest pain, no sore throat, no myalgias and no shortness of breath. She has tried cough syrup for the symptoms.  Headache The primary symptoms include headaches and fever.    Past Medical History  Diagnosis Date  . Hypertension   . MS (multiple sclerosis)   . Colon cancer   . Back pain     Past Surgical History  Procedure Date  . Abdominal hysterectomy   . Colon surgery   . Cesarean section   . Foot surgery     No family history on file.  History  Substance Use Topics  . Smoking status: Never Smoker   . Smokeless tobacco: Not on file  . Alcohol Use: No     occasional    OB History    Grav Para Term Preterm Abortions TAB SAB Ect Mult Living                  Review of Systems  Constitutional: Positive for fever and  chills.  HENT: Positive for congestion and rhinorrhea. Negative for sore throat.   Eyes: Negative.   Respiratory: Positive for cough. Negative for shortness of breath.   Cardiovascular: Negative.  Negative for chest pain.  Gastrointestinal: Negative.   Genitourinary: Negative.   Musculoskeletal: Negative.  Negative for myalgias.  Skin: Negative.   Neurological: Positive for headaches.    Allergies  Review of patient's allergies indicates no known allergies.  Home Medications   Current Outpatient Rx  Name Route Sig Dispense Refill  . DIAMOX SEQUELS PO Oral Take by mouth.    . ASPIRIN 81 MG PO TABS Oral Take 81 mg by mouth daily.      Marland Kitchen LISINOPRIL 10 MG PO TABS Oral Take 10 mg by mouth daily.    Marland Kitchen LISINOPRIL-HYDROCHLOROTHIAZIDE 10-12.5 MG PO TABS Oral Take 1 tablet by mouth daily.      . TRAMADOL HCL PO Oral Take by mouth.    Marland Kitchen FLUTICASONE PROPIONATE 50 MCG/ACT NA SUSP Nasal Place 2 sprays into the nose daily. 16 g 2  . GUAIFENESIN-CODEINE 100-10 MG/5ML PO SYRP Oral Take 5 mLs by mouth 3 (three) times daily as needed for cough. 120 mL 0  . NAPROXEN 500 MG PO TABS Oral Take 1 tablet (500 mg total) by mouth 2 (two) times daily. 20 tablet 0    BP 174/101  Pulse 73  Temp(Src) 98.6 F (37  C) (Oral)  Resp 19  SpO2 98%  Physical Exam  Nursing note and vitals reviewed. Constitutional: She is oriented to person, place, and time. She appears well-developed and well-nourished.  HENT:  Head: Normocephalic and atraumatic.  Right Ear: Tympanic membrane is retracted.  Left Ear: Tympanic membrane is retracted.  Mouth/Throat: Uvula is midline, oropharynx is clear and moist and mucous membranes are normal.  Eyes: EOM are normal.  Neck: Normal range of motion.  Cardiovascular: Normal rate, regular rhythm and normal heart sounds.   No murmur heard. Pulmonary/Chest: Effort normal and breath sounds normal. She has no decreased breath sounds. She has no wheezes. She has no rhonchi. She has no  rales.  Musculoskeletal: Normal range of motion.  Neurological: She is alert and oriented to person, place, and time.  Skin: Skin is warm and dry.  Psychiatric: Her behavior is normal.    ED Course  Procedures (including critical care time)  Labs Reviewed - No data to display No results found.   1. Cough   2. Rhinitis       MDM  rx given for fluticasone and guaifenesin AC; precautions given to return in 48 to 72 hours if no improvement; addressed concerns from daughters regarding swab for flu; symptoms and clinical hx not consistent with flu, no body aches, vomiting; sore throat likely from coughing; outside 48 hour window for initiation of antivirals; supportive care        Richardo Priest, MD 05/15/11 1325

## 2011-05-15 NOTE — ED Notes (Signed)
Received report from [place], rn

## 2011-05-15 NOTE — ED Notes (Signed)
C/o fever, productive cough of yellow sputum, headache and head congestion for 2 days.  Has been taking mucinex DM, tylenol cold and flu and ibuprofen for sx.

## 2011-05-15 NOTE — Discharge Instructions (Signed)
Use nasal spray as directed. Use the counter cough syrup, as needed and as directed. Should you have fever, I recommend continuing aggressive fever control with acetaminophen (Tylenol) and/or ibuprofen. You may use these together, alternating them every 4 hours, or individually, every 8 hours. For example, take acetaminophen 500 to 1000 mg at 12 noon, then 600 to 800 mg of ibuprofen at 4 pm, then acetaminophen at 8 pm, etc. Also, stay hydrated with clear liquids. Return to care in 48 to 72 hours should your symptoms not improve, or worsen in any way.

## 2011-05-15 NOTE — ED Notes (Signed)
Ck on pt ,was ready for discharge.stated she was hungry gave pt soda & graham crackers.

## 2011-06-27 ENCOUNTER — Emergency Department (INDEPENDENT_AMBULATORY_CARE_PROVIDER_SITE_OTHER)
Admission: EM | Admit: 2011-06-27 | Discharge: 2011-06-27 | Disposition: A | Discharge status: Home / Self Care | Payer: Self-pay | Source: Home / Self Care | Attending: Emergency Medicine | Admitting: Emergency Medicine

## 2011-06-27 ENCOUNTER — Encounter (HOSPITAL_COMMUNITY): Payer: Self-pay | Admitting: *Deleted

## 2011-06-27 DIAGNOSIS — L859 Epidermal thickening, unspecified: Secondary | ICD-10-CM

## 2011-06-27 DIAGNOSIS — K439 Ventral hernia without obstruction or gangrene: Secondary | ICD-10-CM

## 2011-06-27 DIAGNOSIS — L851 Acquired keratosis [keratoderma] palmaris et plantaris: Secondary | ICD-10-CM

## 2011-06-27 HISTORY — DX: Umbilical hernia without obstruction or gangrene: K42.9

## 2011-06-27 MED ORDER — TRAMADOL HCL 50 MG PO TABS: 100.0000 mg | ORAL_TABLET | Freq: Three times a day (TID) | ORAL | Status: AC | PRN

## 2011-06-27 MED ORDER — MELOXICAM 15 MG PO TABS: 15.0000 mg | ORAL_TABLET | Freq: Every day | ORAL | Status: AC

## 2011-06-27 NOTE — ED Notes (Signed)
Pt states she has an umbilical hernia and this am was making her son's bunk bed, lost her footing and had to catch herself.  Since then has been having pain in and around hernia.  States it is not larger or smaller, just hurts.  Pt also wants both feet looked at.  States she worked at a job that required her to wear steel toe boots and she now has hardened places on bottom of both feet and on sides.  Calluses noted.  States she can't walk because of the pain, having to walk on her heels.

## 2011-06-27 NOTE — ED Provider Notes (Signed)
Chief Complaint  Patient presents with  . Abdominal Pain  . Foot Pain    History of Present Illness:   Misty [name] is a 51 year old female who presents today with 2 problems: Abdominal pain and bilateral foot pain.  She has had a ventral hernia diagnosed about a year ago. This was diagnosed at [facility]. She does have a bulge there that has been somewhat painful and tender at times. Today she was making a bunk bed and fell backwards. She was able to catch herself on the frame of the bunk bed and did not hit the ground, but this put a strain on her mid abdominal area. Ever since then she's had worse pain in the area. She denies any nausea, vomiting, constipation, or blood in the stool. She's had no urinary symptoms.  She also has had a ten-year history of foot problems. She describes pain in both of her feet and multiple podiatric surgeries. The most recent of these was 10 years ago. She has had to wear steel toed shoes and thinks this might have caused her feet to get worse again. She describes pain in her feet and painful calluses. This caused her to have to quit her job.  Review of Systems:  Other than noted above, the patient denies any of the following symptoms: Constitutional:  No fever, chills, fatigue, weight loss or anorexia. Lungs:  No cough or shortness of breath. Heart:  No chest pain, palpitations, syncope or edema. Abdomen:  No nausea, vomiting, hematememesis, melena, diarrhea, or hematochezia. GU:  No dysuria, frequency, urgency, or hematuria. Gyn:  No vaginal discharge, itching, abnormal bleeding or pelvic pain. Skin:  No rash or itching.  PMFSH:  Past medical history, family history, social history, meds, and allergies were reviewed.  Physical Exam:   Vital signs:  BP 156/91  Pulse 67  Temp(Src) 98.4 F (36.9 C) (Oral)  Resp 18  SpO2 100% Gen:  Alert, oriented, in no distress. Lungs:  Breath sounds clear and equal bilaterally.  No wheezes, rales or  rhonchi. Heart:  Regular rhythm.  No gallops or murmers.   Abdomen:  She has a diastasis rectus in the epigastric area. When she raises her head up off the bed this bulges a little bit. It does reduce easily when she lies flat and there is no evidence of a nonreducible hernia. There is tenderness to palpation over this diastases. There is no guarding or rebound. No masses. No organomegaly. Bowel sounds are normally active. Extremities: She has bilateral pes planus. She has severe calluses on both of her feet which are tender to touch. Pulses are full, she has good capillary refill, strength and sensation are normal. Skin:  Clear, warm and dry.  No rash.   Medical Decision Making:  She has what appears to be a diastases rectus or possibly a small ventral hernia. At any rate there is no evidence of an incarcerated hernia nothing that needs emergent attention. I have urged her to avoid heavy lifting and apply ice. I gave her the name of a surgeon to see in followup. I also gave her the name of a podiatrist to see as well.  Assessment:  The primary encounter diagnosis was Ventral hernia. A diagnosis of Hyperkeratosis of sole was also pertinent to this visit.  Plan:   1.  The following meds were prescribed:   New Prescriptions   MELOXICAM (MOBIC) 15 MG TABLET    Take 1 tablet (15 mg total) by mouth daily.  TRAMADOL (ULTRAM) 50 MG TABLET    Take 2 tablets (100 mg total) by mouth every 8 (eight) hours as needed for pain.   2.  The patient was instructed in symptomatic care and handouts were given. 3.  The patient was told to return if becoming worse in any way, if no better in 3 or 4 days, and given some red flag symptoms that would indicate earlier return.    Reuben Likes, MD 06/27/11 2007

## 2011-06-27 NOTE — Discharge Instructions (Signed)
Hernia  A hernia occurs when an internal organ pushes out through a weak spot in the abdominal wall. Hernias most commonly occur in the groin and around the navel. Hernias often can be pushed back into place (reduced). Most hernias tend to get worse over time. Some abdominal hernias can get stuck in the opening (irreducible or incarcerated hernia) and cannot be reduced. An irreducible abdominal hernia which is tightly squeezed into the opening is at risk for impaired blood supply (strangulated hernia). A strangulated hernia is a medical emergency. Because of the risk for an irreducible or strangulated hernia, surgery may be recommended to repair a hernia.  CAUSES    Heavy lifting.   Prolonged coughing.   Straining to have a bowel movement.   A cut (incision) made during an abdominal surgery.  HOME CARE INSTRUCTIONS    Bed rest is not required. You may continue your normal activities.   Avoid lifting more than 10 pounds (4.5 kg) or straining.   Cough gently. If you are a smoker it is best to stop. Even the best hernia repair can break down with the continual strain of coughing. Even if you do not have your hernia repaired, a cough will continue to aggravate the problem.   Do not wear anything tight over your hernia. Do not try to keep it in with an outside bandage or truss. These can damage abdominal contents if they are trapped within the hernia sac.   Eat a normal diet.   Avoid constipation. Straining over long periods of time will increase hernia size and encourage breakdown of repairs. If you cannot do this with diet alone, stool softeners may be used.  SEEK IMMEDIATE MEDICAL CARE IF:    You have a fever.   You develop increasing abdominal pain.   You feel nauseous or vomit.   Your hernia is stuck outside the abdomen, looks discolored, feels hard, or is tender.   You have any changes in your bowel habits or in the hernia that are unusual for you.   You have increased pain or swelling around the  hernia.   You cannot push the hernia back in place by applying gentle pressure while lying down.  MAKE SURE YOU:    Understand these instructions.   Will watch your condition.   Will get help right away if you are not doing well or get worse.  Document Released: 03/05/2005 Document Revised: 02/22/2011 Document Reviewed: 10/23/2007  [employer] Patient Information 2012 [employer]

## 2011-08-28 ENCOUNTER — Encounter (HOSPITAL_COMMUNITY): Payer: Self-pay

## 2011-08-28 ENCOUNTER — Emergency Department (INDEPENDENT_AMBULATORY_CARE_PROVIDER_SITE_OTHER): Payer: Self-pay

## 2011-08-28 ENCOUNTER — Emergency Department (INDEPENDENT_AMBULATORY_CARE_PROVIDER_SITE_OTHER)
Admission: EM | Admit: 2011-08-28 | Discharge: 2011-08-28 | Disposition: A | Discharge status: Home / Self Care | Payer: Self-pay | Source: Home / Self Care | Attending: Emergency Medicine | Admitting: Emergency Medicine

## 2011-08-28 DIAGNOSIS — M549 Dorsalgia, unspecified: Secondary | ICD-10-CM

## 2011-08-28 DIAGNOSIS — B3731 Acute candidiasis of vulva and vagina: Secondary | ICD-10-CM

## 2011-08-28 DIAGNOSIS — B373 Candidiasis of vulva and vagina: Secondary | ICD-10-CM

## 2011-08-28 DIAGNOSIS — T148XXA Other injury of unspecified body region, initial encounter: Secondary | ICD-10-CM

## 2011-08-28 DIAGNOSIS — IMO0002 Reserved for concepts with insufficient information to code with codable children: Secondary | ICD-10-CM

## 2011-08-28 DIAGNOSIS — W19XXXA Unspecified fall, initial encounter: Secondary | ICD-10-CM

## 2011-08-28 LAB — POCT URINALYSIS DIP (DEVICE)
Glucose, UA: NEGATIVE mg/dL
Hgb urine dipstick: NEGATIVE
Ketones, ur: NEGATIVE mg/dL
Leukocytes, UA: NEGATIVE
Nitrite: NEGATIVE
Protein, ur: NEGATIVE mg/dL
Specific Gravity, Urine: >=1.03 (ref 1.005–1.030)
Urobilinogen, UA: 1 mg/dL (ref 0.0–1.0)
pH: 5.5 (ref 5.0–8.0)

## 2011-08-28 MED ORDER — HYDROCODONE-ACETAMINOPHEN 5-325 MG PO TABS: 2.0000 | ORAL_TABLET | ORAL | Status: AC | PRN

## 2011-08-28 MED ORDER — HYDROCODONE-ACETAMINOPHEN 5-325 MG PO TABS
ORAL_TABLET | ORAL | Status: AC
  Filled 2011-08-28: qty 1

## 2011-08-28 MED ORDER — HYDROCODONE-ACETAMINOPHEN 5-325 MG PO TABS
1.0000 | ORAL_TABLET | Freq: Once | ORAL | Status: AC
  Administered 2011-08-28: 1 via ORAL

## 2011-08-28 MED ORDER — METHOCARBAMOL 500 MG PO TABS: 1000.0000 mg | ORAL_TABLET | Freq: Four times a day (QID) | ORAL | Status: AC

## 2011-08-28 MED ORDER — FLUCONAZOLE 150 MG PO TABS: 150.0000 mg | ORAL_TABLET | Freq: Every day | ORAL | Status: AC

## 2011-08-28 NOTE — ED Provider Notes (Signed)
History     CSN: [id number]  Arrival date & time 08/28/11  1241   First MD Initiated Contact with Patient 08/28/11 1300      Chief Complaint  Patient presents with  . Fall    (Consider location/radiation/quality/duration/timing/severity/associated sxs/prior treatment) HPI Comments: Pt was walking down 4 steps on front porch this morning "and my legs gave out on me".  Denies dizziness or slipping on steps.  Fell two steps down to B knees, then sideways onto R hip.  C/o abrasions B knees, mild knee pain, and pain to R hip/lower back. [facility] but [facility] can't see her until 6/13.  Incidentally reports having vaginal yeast infection, requests rx for diflucan.  Patient is a 51 y.o. female presenting with fall. The history is provided by the patient.  Fall The accident occurred 1 to 2 hours ago. The fall occurred while walking (down stairs). She fell from a height of 1 to 2 ft. She landed on concrete. There was no blood loss. The point of impact was the right hip, left knee, right knee and head. The pain is present in the right knee, left knee, right hip and head. The pain is at a severity of 8/10. The pain is moderate. Pertinent negatives include no numbness, no abdominal pain, no nausea, no vomiting, no headaches, no loss of consciousness and no tingling. Exacerbated by: palpation and exertion/movement. She has tried nothing for the symptoms.    Past Medical History  Diagnosis Date  . Hypertension   . MS (multiple sclerosis)   . Colon cancer   . Back pain   . Umbilical hernia     Past Surgical History  Procedure Date  . Abdominal hysterectomy   . Colon surgery   . Cesarean section   . Foot surgery     History reviewed. No pertinent family history.  History  Substance Use Topics  . Smoking status: Current Some Day Smoker  . Smokeless tobacco: Not on file  . Alcohol Use: No     occasional    OB History    Grav Para Term Preterm Abortions TAB SAB Ect  Mult Living                  Review of Systems  Gastrointestinal: Negative for nausea, vomiting and abdominal pain.  Neurological: Negative for tingling, loss of consciousness, numbness and headaches.    Allergies  Review of patient's allergies indicates no known allergies.  Home Medications   Current Outpatient Rx  Name Route Sig Dispense Refill  . ASPIRIN 81 MG PO TABS Oral Take 81 mg by mouth daily.      Marland Kitchen LISINOPRIL 10 MG PO TABS Oral Take 10 mg by mouth daily.    Marland Kitchen DIAMOX SEQUELS PO Oral Take by mouth.    Marland Kitchen FLUCONAZOLE 150 MG PO TABS Oral Take 1 tablet (150 mg total) by mouth daily. 1 tablet 0  . FLUTICASONE PROPIONATE 50 MCG/ACT NA SUSP Nasal Place 2 sprays into the nose daily. 16 g 2  . HYDROCODONE-ACETAMINOPHEN 5-325 MG PO TABS Oral Take 2 tablets by mouth every 4 (four) hours as needed for pain. 10 tablet 0  . LISINOPRIL-HYDROCHLOROTHIAZIDE 10-12.5 MG PO TABS Oral Take 1 tablet by mouth daily.      . MELOXICAM 15 MG PO TABS Oral Take 1 tablet (15 mg total) by mouth daily. 15 tablet 0  . METHOCARBAMOL 500 MG PO TABS Oral Take 2 tablets (1,000 mg total) by mouth  4 (four) times daily. 40 tablet 0  . NAPROXEN 500 MG PO TABS Oral Take 1 tablet (500 mg total) by mouth 2 (two) times daily. 20 tablet 0  . TRAMADOL HCL PO Oral Take by mouth.      BP 150/91  Pulse 76  Temp(Src) 97.9 F (36.6 C) (Oral)  Resp 18  SpO2 99%  Physical Exam  Constitutional: She appears well-developed and well-nourished.       Uncomfortable appearing  HENT:  Head: Normocephalic and atraumatic.       Head nontender to palp; no abrasions, hematomas tender areas, or signs of trauma noted.   Pulmonary/Chest: Effort normal.  Genitourinary:       Pt refuses GU exam.   Musculoskeletal:       Right hip: She exhibits tenderness and bony tenderness. She exhibits normal range of motion, no swelling and no deformity.       Right knee: Normal.       Left knee: Normal.       Lumbar back: She exhibits  tenderness. She exhibits no bony tenderness, no edema and no spasm.  Neurological: Gait normal.  Skin: Abrasion noted.       Abrasions to B knees    ED Course  Procedures (including critical care time)  Labs Reviewed  POCT URINALYSIS DIP (DEVICE) - Abnormal; Notable for the following:    Bilirubin Urine SMALL (*)    All other components within normal limits   Dg Hip Complete Right  08/28/2011  *RADIOLOGY REPORT*  Clinical Data: Status post fall  RIGHT HIP - COMPLETE 2+ VIEW  Comparison: 10/07/2007  Findings: There is a mild osteoarthritis involving the right hip.  No fracture or subluxation identified.  No radiopaque foreign body or soft tissue calcifications noted.  IMPRESSION:  1.  Mild osteoarthritis of the right hip.  Original Report Authenticated By: Rosealee Albee, M.D.     1. Back pain   2. Fall   3. Abrasion   4. Candida vaginitis       MDM  Rn was concerned about color of urine (not sure why urine sample was obtained), UA performed and shows some dehydration.  Pt not symptomatic for dehydration, encouraged to drink fluids when gets home.          Cathlyn Parsons, NP 08/31/11 1123

## 2011-08-28 NOTE — Discharge Instructions (Signed)
Follow up with [facility] as scheduled on Thursday.  Call Dr. Darrick Penna office for a sports medicine rehab appointment.    Lumbosacral Strain Lumbosacral strain is one of the most common causes of back pain. There are many causes of back pain. Most are not serious conditions. CAUSES  Your backbone (spinal column) is made up of 24 main vertebral bodies, the sacrum, and the coccyx. These are held together by muscles and tough, fibrous tissue (ligaments). Nerve roots pass through the openings between the vertebrae. A sudden move or injury to the back may cause injury to, or pressure on, these nerves. This may result in localized back pain or pain movement (radiation) into the buttocks, down the leg, and into the foot. Sharp, shooting pain from the buttock down the back of the leg (sciatica) is frequently associated with a ruptured (herniated) disk. Pain may be caused by muscle spasm alone. Your caregiver can often find the cause of your pain by the details of your symptoms and an exam. In some cases, you may need tests (such as X-rays). Your caregiver will work with you to decide if any tests are needed based on your specific exam. HOME CARE INSTRUCTIONS   Avoid an underactive lifestyle. Active exercise, as directed by your caregiver, is your greatest weapon against back pain.   Avoid hard physical activities (tennis, racquetball, waterskiing) if you are not in proper physical condition for it. This may aggravate or create problems.   If you have a back problem, avoid sports requiring sudden body movements. Swimming and walking are generally safer activities.   Maintain good posture.   Avoid becoming overweight (obese).   Use bed rest for only the most extreme, sudden (acute) episode. Your caregiver will help you determine how much bed rest is necessary.   For acute conditions, you may put ice on the injured area.   Put ice in a plastic bag.   Place a towel between your skin and the bag.    Leave the ice on for 15 to 20 minutes at a time, every 2 hours, or as needed.   After you are improved and more active, it may help to apply heat for 30 minutes before activities.  See your caregiver if you are having pain that lasts longer than expected. Your caregiver can advise appropriate exercises or therapy if needed. With conditioning, most back problems can be avoided. SEEK IMMEDIATE MEDICAL CARE IF:   You have numbness, tingling, weakness, or problems with the use of your arms or legs.   You experience severe back pain not relieved with medicines.   There is a change in bowel or bladder control.   You have increasing pain in any area of the body, including your belly (abdomen).   You notice shortness of breath, dizziness, or feel faint.   You feel sick to your stomach (nauseous), are throwing up (vomiting), or become sweaty.   You notice discoloration of your toes or legs, or your feet get very cold.   Your back pain is getting worse.   You have a fever.  MAKE SURE YOU:   Understand these instructions.   Will watch your condition.   Will get help right away if you are not doing well or get worse.  Document Released: 12/13/2004 Document Revised: 02/22/2011 Document Reviewed: 06/04/2008 [facility] Patient Information 2012 [place], [place].

## 2011-08-28 NOTE — ED Notes (Signed)
Patient has hist of MS, legs frequently "give out on me" . State she was descending stairs when she fell w/o warning, landed on knees. C/o pain both knees, pain in back, HA

## 2011-09-03 NOTE — ED Provider Notes (Signed)
Medical screening examination/treatment/procedure(s) were performed by non-physician practitioner and as supervising physician I was immediately available for consultation/collaboration.  Leslee Home, M.D.   Reuben Likes, MD 09/03/11 2104

## 2012-07-16 ENCOUNTER — Emergency Department (HOSPITAL_COMMUNITY)
Admission: EM | Admit: 2012-07-16 | Discharge: 2012-07-16 | Disposition: A | Discharge status: Home / Self Care | Payer: Self-pay | Attending: Emergency Medicine | Admitting: Emergency Medicine

## 2012-07-16 ENCOUNTER — Encounter (HOSPITAL_COMMUNITY): Payer: Self-pay

## 2012-07-16 DIAGNOSIS — Z87828 Personal history of other (healed) physical injury and trauma: Secondary | ICD-10-CM | POA: Insufficient documentation

## 2012-07-16 DIAGNOSIS — R220 Localized swelling, mass and lump, head: Secondary | ICD-10-CM | POA: Insufficient documentation

## 2012-07-16 DIAGNOSIS — Z8719 Personal history of other diseases of the digestive system: Secondary | ICD-10-CM | POA: Insufficient documentation

## 2012-07-16 DIAGNOSIS — Z79899 Other long term (current) drug therapy: Secondary | ICD-10-CM | POA: Insufficient documentation

## 2012-07-16 DIAGNOSIS — I10 Essential (primary) hypertension: Secondary | ICD-10-CM | POA: Insufficient documentation

## 2012-07-16 DIAGNOSIS — F17200 Nicotine dependence, unspecified, uncomplicated: Secondary | ICD-10-CM | POA: Insufficient documentation

## 2012-07-16 DIAGNOSIS — K089 Disorder of teeth and supporting structures, unspecified: Secondary | ICD-10-CM | POA: Insufficient documentation

## 2012-07-16 DIAGNOSIS — Z8669 Personal history of other diseases of the nervous system and sense organs: Secondary | ICD-10-CM | POA: Insufficient documentation

## 2012-07-16 DIAGNOSIS — K0889 Other specified disorders of teeth and supporting structures: Secondary | ICD-10-CM

## 2012-07-16 DIAGNOSIS — Z85038 Personal history of other malignant neoplasm of large intestine: Secondary | ICD-10-CM | POA: Insufficient documentation

## 2012-07-16 DIAGNOSIS — Z7982 Long term (current) use of aspirin: Secondary | ICD-10-CM | POA: Insufficient documentation

## 2012-07-16 MED ORDER — FLUCONAZOLE 150 MG PO TABS: 150.0000 mg | ORAL_TABLET | Freq: Once | ORAL | Status: DC

## 2012-07-16 MED ORDER — HYDROCODONE-ACETAMINOPHEN 5-325 MG PO TABS: 2.0000 | ORAL_TABLET | ORAL | Status: DC | PRN

## 2012-07-16 MED ORDER — HYDROCODONE-ACETAMINOPHEN 5-325 MG PO TABS: 2.0000 | ORAL_TABLET | Freq: Once | ORAL | Status: DC

## 2012-07-16 MED ORDER — AMOXICILLIN 500 MG PO CAPS: 500.0000 mg | ORAL_CAPSULE | Freq: Three times a day (TID) | ORAL | Status: DC

## 2012-07-16 NOTE — ED Notes (Signed)
Patient presents with c/o right sided dental pain that radiates to right ear x 2 days. Reports having a filling that came out "a while ago." Patient able to tolerate fluids and soft foods. Tried liquid Goody and BC powder with no relief.

## 2012-07-16 NOTE — ED Provider Notes (Signed)
Medical screening examination/treatment/procedure(s) were performed by non-physician practitioner and as supervising physician I was immediately available for consultation/collaboration.  Olivia Mackie, MD 07/16/12 [phone]

## 2012-07-16 NOTE — ED Provider Notes (Signed)
History     CSN: [id number]  Arrival date & time 07/16/12  0530   First MD Initiated Contact with Patient 07/16/12 [phone]      Chief Complaint  Patient presents with  . Dental Pain    (Consider location/radiation/quality/duration/timing/severity/associated sxs/prior treatment) Patient is a 52 y.o. female presenting with tooth pain. The history is provided by the patient. No language interpreter was used.  Dental PainThe primary symptoms include mouth pain and dental injury. The symptoms are unchanged. The symptoms are new. The symptoms occur constantly.  Additional symptoms include: gum swelling and gum tenderness.  Pt broke her tooth a year ago, now swollen and painful  Past Medical History  Diagnosis Date  . Hypertension   . MS (multiple sclerosis)   . Colon cancer   . Back pain   . Umbilical hernia     Past Surgical History  Procedure Laterality Date  . Abdominal hysterectomy    . Colon surgery    . Cesarean section    . Foot surgery      No family history on file.  History  Substance Use Topics  . Smoking status: Current Some Day Smoker  . Smokeless tobacco: Never Used  . Alcohol Use: No     Comment: occasional    OB History   Grav Para Term Preterm Abortions TAB SAB Ect Mult Living                  Review of Systems  HENT: Positive for dental problem.   All other systems reviewed and are negative.    Allergies  Phenergan  Home Medications   Current Outpatient Rx  Name  Route  Sig  Dispense  Refill  . aspirin 81 MG tablet   Oral   Take 81 mg by mouth daily.           . Aspirin-Salicylamide-Caffeine (BC HEADACHE POWDER PO)   Oral   Take 1 Package by mouth every 6 (six) hours as needed (for pain).         Marland Kitchen lisinopril (PRINIVIL,ZESTRIL) 10 MG tablet   Oral   Take 10 mg by mouth daily.         . Multiple Vitamin (MULTIVITAMIN WITH MINERALS) TABS   Oral   Take 1 tablet by mouth daily.           BP 163/82  Pulse 82  Temp(Src)  98.7 F (37.1 C) (Oral)  Resp 18  SpO2 100%  Physical Exam  Nursing note and vitals reviewed. Constitutional: She is oriented to person, place, and time. She appears well-developed and well-nourished.  HENT:  Head: Normocephalic.  Right Ear: External ear normal.  Left Ear: External ear normal.  Nose: Nose normal.  Mouth/Throat: Oropharynx is clear and moist.  Eyes: Conjunctivae and EOM are normal. Pupils are equal, round, and reactive to light.  Neck: Normal range of motion. Neck supple.  Cardiovascular: Normal rate.   Pulmonary/Chest: Effort normal.  Abdominal: Soft.  Musculoskeletal: Normal range of motion.  Neurological: She is alert and oriented to person, place, and time. She has normal reflexes.  Skin: Skin is warm.  Psychiatric: She has a normal mood and affect.    ED Course  Procedures (including critical care time)  Labs Reviewed - No data to display No results found.   No diagnosis found.    MDM  amoxicillian and hydrocodone        Lonia Skinner [place], [place] 07/16/12 [phone]

## 2012-10-03 ENCOUNTER — Ambulatory Visit: Payer: Self-pay | Admitting: Family Medicine

## 2012-10-10 ENCOUNTER — Ambulatory Visit (INDEPENDENT_AMBULATORY_CARE_PROVIDER_SITE_OTHER): Payer: BC Managed Care – PPO | Admitting: Emergency Medicine

## 2012-10-10 ENCOUNTER — Ambulatory Visit: Payer: BC Managed Care – PPO

## 2012-10-10 VITALS — BP 160/85 | HR 62 | Temp 97.9°F | Resp 16 | Ht 65.25 in | Wt 215.0 lb

## 2012-10-10 DIAGNOSIS — M549 Dorsalgia, unspecified: Secondary | ICD-10-CM

## 2012-10-10 DIAGNOSIS — R269 Unspecified abnormalities of gait and mobility: Secondary | ICD-10-CM

## 2012-10-10 DIAGNOSIS — G932 Benign intracranial hypertension: Secondary | ICD-10-CM

## 2012-10-10 DIAGNOSIS — R300 Dysuria: Secondary | ICD-10-CM

## 2012-10-10 DIAGNOSIS — G35 Multiple sclerosis: Secondary | ICD-10-CM

## 2012-10-10 LAB — POCT URINALYSIS DIPSTICK
Bilirubin, UA: NEGATIVE
Glucose, UA: NEGATIVE
Ketones, UA: NEGATIVE
Leukocytes, UA: NEGATIVE
Nitrite, UA: NEGATIVE
Protein, UA: NEGATIVE
Spec Grav, UA: 1.025
Urobilinogen, UA: 0.2
pH, UA: 5.5

## 2012-10-10 LAB — POCT UA - MICROSCOPIC ONLY
Casts, Ur, LPF, POC: NEGATIVE
Crystals, Ur, HPF, POC: NEGATIVE
Mucus, UA: POSITIVE
Yeast, UA: NEGATIVE

## 2012-10-10 MED ORDER — TRAMADOL HCL 50 MG PO TABS: 50.0000 mg | ORAL_TABLET | Freq: Three times a day (TID) | ORAL | Status: DC | PRN

## 2012-10-10 NOTE — Progress Notes (Signed)
Subjective:  This chart was scribe for Misty Chris, MD by Jodell Cipro, ED Scribe 10/10/12.  Pt care started at 3:15PM   Patient ID: Misty [name], female    DOB: November 14, 1960, 52 y.o.   MRN: [id number]  HPI  Patient, Misty [name], 52 yo female with h/o MS, gait disorder, and pseudotumour cerebri, complains of lower back pain that started yesterday which is unchanged.  She describes the pain as 8/10.  Pt reports she became dizzy, lost her balance, and fell several days ago.  Pt reports she has been falling more recently, falling 8-10 times in the last year, and has seen a neurologist in the past, but has not followed up due to lack of insurance.  Pt now has insurance and requests a referral for a neurologist.  Standing and moving makes the back pain worse.  She denies weakness in her legs.  She is also experiencing increased urination, dizziness, and blurred vision.  Pt reports h/o kidney stones.  She also has h/o back problems after a fall down 2 flights of stairs one year ago and has gone through physical therapy for this.  She reports her last CT head was in fall of 2013.  Pt reports lumbar puncture one year ago.      Review of Systems  Eyes: Positive for visual disturbance (blurred vision).  Genitourinary: Positive for frequency. Negative for dysuria and vaginal discharge.  Musculoskeletal: Positive for back pain (lower back pain) and gait problem.  Neurological: Positive for dizziness. Negative for weakness.  All other systems reviewed and are negative.       Objective:   Physical Exam  Nursing note and vitals reviewed. Constitutional: She is oriented to person, place, and time. She appears well-developed and well-nourished. No distress.  HENT:  Head: Normocephalic and atraumatic.     Eyes:  Disc margins are flat, no hemorrhage.  Neck: Normal range of motion. Neck supple.  Cardiovascular: Normal rate and regular rhythm.   No murmur heard. Pulmonary/Chest: Effort normal and  breath sounds normal. No respiratory distress. She has no wheezes. She has no rales.  Musculoskeletal:  4/5 grip strength, symmetrical. Tenderness to L5-S1 on the left.  No CVA tenderness.   Neurological: She is alert and oriented to person, place, and time.  Skin: Skin is warm and dry. She is not diaphoretic.  Psychiatric: She has a normal mood and affect. Her behavior is normal.    [facility] reading (PRIMARY) by  Dr.Daub patient has significant degenerative disc disease of the lower thoracic and upper lumbar spine. 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 On the frontal view she has evidence of a previous ventral hernia repair as well as air filling loops of small bowel   Results for orders placed in visit on 10/10/12  POCT UA - MICROSCOPIC ONLY      Result Value Range   WBC, Ur, HPF, POC 4-5     RBC, urine, microscopic 4-7     Bacteria, U Microscopic 2+     Mucus, UA pos     Epithelial cells, urine per micros 0-3     Crystals, Ur, HPF, POC neg     Casts, Ur, LPF, POC neg     Yeast, UA neg    POCT URINALYSIS DIPSTICK      Result Value Range   Color, UA yellow     Clarity, UA clear     Glucose, UA neg     Bilirubin, UA neg     Ketones,  UA neg     Spec Grav, UA 1.025     Blood, UA trace     pH, UA 5.5     Protein, UA neg     Urobilinogen, UA 0.2     Nitrite, UA neg     Leukocytes, UA Negative      3:23 PM Discussed course of care with pt which includes urinalysis and images of back. Will provide urgent referral to neurologist.  Pt understands and agrees.   3:57 PM Discussed images and labs with pt and course of care which includes urgent follow up with neurologist.  Will order head CT.       Assessment & Plan:   Meds ordered this encounter  Medications  . traMADol (ULTRAM) 50 MG tablet    Sig: Take 1 tablet (50 mg total) by mouth every 8 (eight) hours as needed for pain.    Dispense:  30 tablet    Refill:  0

## 2012-10-12 LAB — URINE CULTURE
Colony Count: NO GROWTH
Organism ID, Bacteria: NO GROWTH

## 2012-10-14 ENCOUNTER — Ambulatory Visit
Admission: RE | Admit: 2012-10-14 | Discharge: 2012-10-14 | Disposition: A | Discharge status: Home / Self Care | Payer: BC Managed Care – PPO | Source: Ambulatory Visit | Attending: Emergency Medicine | Admitting: Emergency Medicine

## 2012-10-14 DIAGNOSIS — R269 Unspecified abnormalities of gait and mobility: Secondary | ICD-10-CM

## 2012-10-14 DIAGNOSIS — G932 Benign intracranial hypertension: Secondary | ICD-10-CM

## 2012-10-14 DIAGNOSIS — G35 Multiple sclerosis: Secondary | ICD-10-CM

## 2012-11-03 ENCOUNTER — Ambulatory Visit: Payer: BC Managed Care – PPO | Admitting: Family Medicine

## 2012-12-17 ENCOUNTER — Other Ambulatory Visit: Payer: Self-pay | Admitting: Psychiatry

## 2012-12-17 DIAGNOSIS — G35 Multiple sclerosis: Secondary | ICD-10-CM

## 2012-12-22 ENCOUNTER — Ambulatory Visit
Admission: RE | Admit: 2012-12-22 | Discharge: 2012-12-22 | Disposition: A | Discharge status: Home / Self Care | Payer: BC Managed Care – PPO | Source: Ambulatory Visit | Attending: Psychiatry | Admitting: Psychiatry

## 2012-12-22 ENCOUNTER — Ambulatory Visit
Admission: RE | Admit: 2012-12-22 | Discharge: 2012-12-22 | Disposition: A | Discharge status: Home / Self Care | Payer: Medicare Other | Source: Ambulatory Visit | Attending: Psychiatry | Admitting: Psychiatry

## 2012-12-22 ENCOUNTER — Other Ambulatory Visit: Payer: BC Managed Care – PPO

## 2012-12-22 ENCOUNTER — Other Ambulatory Visit: Payer: Self-pay | Admitting: Psychiatry

## 2012-12-22 DIAGNOSIS — G35 Multiple sclerosis: Secondary | ICD-10-CM

## 2012-12-22 MED ORDER — GADOBENATE DIMEGLUMINE 529 MG/ML IV SOLN
20.0000 mL | Freq: Once | INTRAVENOUS | Status: AC | PRN
  Administered 2012-12-22: 20 mL via INTRAVENOUS

## 2012-12-24 ENCOUNTER — Telehealth: Payer: Self-pay | Admitting: Neurology

## 2012-12-24 NOTE — Telephone Encounter (Signed)
MRI the brain apparently was ordered, my name is tagged to this study. We have not seen this patient through our office since 2010. I am not sure who actually ordered this study, I have not called the patient. The patient was seen by her primary care physician recently, and referred to this office for a reevaluation of multiple sclerosis. She has not yet been seen through this office.

## 2013-01-21 ENCOUNTER — Ambulatory Visit: Payer: BC Managed Care – PPO | Admitting: Physical Therapy

## 2013-03-17 ENCOUNTER — Telehealth: Payer: Self-pay

## 2013-03-17 ENCOUNTER — Ambulatory Visit (INDEPENDENT_AMBULATORY_CARE_PROVIDER_SITE_OTHER): Payer: BC Managed Care – PPO | Admitting: Internal Medicine

## 2013-03-17 VITALS — BP 132/82 | HR 79 | Temp 98.8°F | Resp 17 | Ht 65.5 in | Wt 216.0 lb

## 2013-03-17 DIAGNOSIS — I10 Essential (primary) hypertension: Secondary | ICD-10-CM

## 2013-03-17 DIAGNOSIS — K2270 Barrett's esophagus without dysplasia: Secondary | ICD-10-CM

## 2013-03-17 DIAGNOSIS — J329 Chronic sinusitis, unspecified: Secondary | ICD-10-CM

## 2013-03-17 DIAGNOSIS — M79674 Pain in right toe(s): Secondary | ICD-10-CM

## 2013-03-17 DIAGNOSIS — M79609 Pain in unspecified limb: Secondary | ICD-10-CM

## 2013-03-17 DIAGNOSIS — R209 Unspecified disturbances of skin sensation: Secondary | ICD-10-CM

## 2013-03-17 DIAGNOSIS — E559 Vitamin D deficiency, unspecified: Secondary | ICD-10-CM

## 2013-03-17 DIAGNOSIS — R202 Paresthesia of skin: Secondary | ICD-10-CM

## 2013-03-17 DIAGNOSIS — M1990 Unspecified osteoarthritis, unspecified site: Secondary | ICD-10-CM

## 2013-03-17 DIAGNOSIS — Z0000 Encounter for general adult medical examination without abnormal findings: Secondary | ICD-10-CM

## 2013-03-17 LAB — POCT URINALYSIS DIPSTICK
Blood, UA: NEGATIVE
Glucose, UA: NEGATIVE
Leukocytes, UA: NEGATIVE
Nitrite, UA: NEGATIVE
Spec Grav, UA: >=1.03
Urobilinogen, UA: 4
pH, UA: 5.5

## 2013-03-17 LAB — COMPREHENSIVE METABOLIC PANEL
ALT: 29 U/L (ref 0–35)
AST: 57 U/L — ABNORMAL HIGH (ref 0–37)
Albumin: 4.2 g/dL (ref 3.5–5.2)
Alkaline Phosphatase: 78 U/L (ref 39–117)
BUN: 8 mg/dL (ref 6–23)
CO2: 26 mEq/L (ref 19–32)
Calcium: 9.6 mg/dL (ref 8.4–10.5)
Chloride: 101 mEq/L (ref 96–112)
Creat: 0.54 mg/dL (ref 0.50–1.10)
Glucose, Bld: 96 mg/dL (ref 70–99)
Potassium: 3.8 mEq/L (ref 3.5–5.3)
Sodium: 138 mEq/L (ref 135–145)
Total Bilirubin: 1 mg/dL (ref 0.3–1.2)
Total Protein: 7.7 g/dL (ref 6.0–8.3)

## 2013-03-17 LAB — POCT CBC
Granulocyte percent: 69.9 %G (ref 37–80)
HCT, POC: 43.3 % (ref 37.7–47.9)
Hemoglobin: 13.3 g/dL (ref 12.2–16.2)
Lymph, poc: 1.5 (ref 0.6–3.4)
MCH, POC: 34.4 pg — AB (ref 27–31.2)
MCHC: 30.9 g/dL — AB (ref 31.8–35.4)
MCV: 111.1 fL — AB (ref 80–97)
MID (cbc): 0.4 (ref 0–0.9)
MPV: 9.1 fL (ref 0–99.8)
POC Granulocyte: 4.3 (ref 2–6.9)
POC LYMPH PERCENT: 23.6 %L (ref 10–50)
POC MID %: 6.5 %M (ref 0–12)
Platelet Count, POC: 198 10*3/uL (ref 142–424)
RBC: 3.87 M/uL — AB (ref 4.04–5.48)
RDW, POC: 14.2 %
WBC: 6.2 10*3/uL (ref 4.6–10.2)

## 2013-03-17 LAB — VITAMIN B12: Vitamin B-12: 174 pg/mL — ABNORMAL LOW (ref 211–911)

## 2013-03-17 LAB — LIPID PANEL
Cholesterol: 278 mg/dL — ABNORMAL HIGH (ref 0–200)
HDL: 121 mg/dL (ref >39)
LDL Cholesterol: 136 mg/dL — ABNORMAL HIGH (ref 0–99)
Total CHOL/HDL Ratio: 2.3 Ratio
Triglycerides: 106 mg/dL (ref <150)
VLDL: 21 mg/dL (ref 0–40)

## 2013-03-17 LAB — TSH: TSH: 0.703 u[IU]/mL (ref 0.350–4.500)

## 2013-03-17 MED ORDER — ESOMEPRAZOLE MAGNESIUM 40 MG PO CPDR: 40.0000 mg | DELAYED_RELEASE_CAPSULE | Freq: Every day | ORAL | Status: DC

## 2013-03-17 MED ORDER — TRAMADOL HCL 50 MG PO TABS: 50.0000 mg | ORAL_TABLET | Freq: Three times a day (TID) | ORAL | Status: DC | PRN

## 2013-03-17 MED ORDER — LISINOPRIL 10 MG PO TABS: 10.0000 mg | ORAL_TABLET | Freq: Every day | ORAL | Status: DC

## 2013-03-17 MED ORDER — CYCLOBENZAPRINE HCL 5 MG PO TABS: 5.0000 mg | ORAL_TABLET | Freq: Every day | ORAL | Status: DC

## 2013-03-17 MED ORDER — FLUCONAZOLE 150 MG PO TABS: 150.0000 mg | ORAL_TABLET | Freq: Once | ORAL | Status: DC

## 2013-03-17 MED ORDER — GUAIFENESIN ER 1200 MG PO TB12: 1.0000 | ORAL_TABLET | Freq: Two times a day (BID) | ORAL | Status: AC

## 2013-03-17 MED ORDER — CELECOXIB 200 MG PO CAPS: 200.0000 mg | ORAL_CAPSULE | Freq: Every day | ORAL | Status: DC

## 2013-03-17 MED ORDER — AMOXICILLIN 875 MG PO TABS: 875.0000 mg | ORAL_TABLET | Freq: Two times a day (BID) | ORAL | Status: DC

## 2013-03-17 NOTE — Telephone Encounter (Signed)
Pt states that the nexium and celebrex will cost her over $300, pt would like to know if there is anything else that can be called in. Best# [phone] [facility] on [address]

## 2013-03-17 NOTE — Telephone Encounter (Signed)
Maralyn Sago,  You saw this patient this morning.

## 2013-03-17 NOTE — Telephone Encounter (Signed)
Can we call [facility] please Pt has barrett's esophagus and Nexium is the only approved medication for this condition - do we need to do a PA? To get it covered? - also I think that Nexium is generic? Also with her h/o Barretts esophagus and significant arthritis celebrex is the best medication for her - this is generic - why is it so expensive

## 2013-03-17 NOTE — Progress Notes (Signed)
Subjective:    Patient ID: Misty [name], female    DOB: Sep 10, 1960, 52 y.o.   MRN: [id number]  HPI Pt presents to clinic for a CPE - She has many problems and has not had medical care in several years due to she lost her insurance. 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 because of the weakness and tingling in her hands and she has trouble gripping - had MRI and CT and LP and visual test and nerve conduction - has not gotten the results yet -  4- Vision changes - has appt with eye dr in January - problems with up close vision and readers help when she uses them she has no problems with far vision --  5- has had a cold for about 2 weeks - she thinks she got from her grandchildren but she has not gotten better - she has nasal congestion with PND but the nasal rhinorrhea is clear - she has a cough but she thinks it is from PND and the feeling that something is stuck in her throat 6- she has h/o colon cancer and had chemo and a resection - she has had loose stool since - she missed her appt with the GI doctor for her repeat colonoscopy - she has seen them this year 7- she is sexually active with the same partner for the last 4 years and she is not interested in STD testing - she had a pap smear 2 years ago-  8- h/o HTN - ran out of her meds about 4 days ago - she feels fine without chest pain 9- s/p weight loss surgery - about 8 years ago 10- last mammogram about 4-5 years ago - it was normal per patient  Review of  Systems  HENT: Positive for congestion, postnasal drip, rhinorrhea (clear) and trouble swallowing (feels like something is in her throat). Negative for sore throat.   Eyes: Positive for visual disturbance (trouble with up close vision).  Respiratory: Negative.   Cardiovascular: Negative.   Gastrointestinal: Positive for diarrhea (loose stool since her colon resection from colon cancer). Negative for nausea, vomiting, constipation and rectal pain.  Endocrine: Negative.   Genitourinary: Negative.   Musculoskeletal: Positive for back pain, gait problem (balance seems off) and neck pain. Negative for neck stiffness.  Skin: Negative.   Allergic/Immunologic: Negative.   Neurological: Positive for weakness and light-headedness. Negative for dizziness and headaches.  Hematological: Negative.   Psychiatric/Behavioral: Negative.        Objective:   Physical Exam  Vitals reviewed. Constitutional: She is oriented to person, place, and time. She appears well-developed and well-nourished.  HENT:  Head: Normocephalic and atraumatic.  Right Ear: Hearing, tympanic membrane, external ear and ear canal normal.  Left Ear: Hearing, tympanic membrane, external ear and ear canal normal.  Nose: Mucosal edema (red) present.  Mouth/Throat: Uvula is midline, oropharynx is clear and moist and mucous membranes are normal.  Eyes: Conjunctivae and EOM are normal. Pupils are equal, round, and reactive to light. Right eye exhibits no discharge. Left eye exhibits no discharge.  Neck: Normal range of motion. Neck supple. No thyromegaly present.  Cardiovascular: Normal rate, regular rhythm and normal heart sounds.   No murmur heard. Pulmonary/Chest: Effort normal and breath sounds normal. She has no decreased breath sounds. She has no wheezes.  Abdominal: Soft. There is no tenderness.  Genitourinary: No breast swelling, tenderness, discharge or bleeding. Pelvic exam was performed with patient supine.  Musculoskeletal:  Normal range of motion.  Lymphadenopathy:       Head (right side): No preauricular adenopathy present.       Head (left side): No preauricular adenopathy present.    She has no cervical adenopathy.       Right: No supraclavicular adenopathy present.       Left: No supraclavicular adenopathy present.  Neurological: She is alert and oriented to person, place, and time. No cranial nerve deficit or sensory deficit.  Reflex Scores:      Tricep reflexes are 2+ on the right side and 2+ on the left side.      Bicep reflexes are 2+ on the right side and 2+ on the left side.      Patellar reflexes are 1+ on the right side and 1+ on the left side. Motor strength 4/5 upper extremities  Skin: Skin is warm and dry.  Right 2nd toe with darken nail and TTP -   Psychiatric: She has a normal mood and affect. Her behavior is normal. Judgment and thought content normal.   Results for orders placed in visit on 03/17/13  POCT CBC      Result Value Range   WBC 6.2  4.6 - 10.2 K/uL   Lymph, poc 1.5  0.6 - 3.4   POC LYMPH PERCENT 23.6  10 - 50 %L   MID (cbc) 0.4  0 - 0.9   POC MID % 6.5  0 - 12 %M   POC Granulocyte 4.3  2 - 6.9   Granulocyte percent 69.9  37 - 80 %G   RBC 3.87 (*) 4.04 - 5.48 M/uL   Hemoglobin 13.3  12.2 - 16.2 g/dL   HCT, POC 16.1  09.6 - 47.9 %   MCV 111.1 (*) 80 - 97 fL   MCH, POC 34.4 (*) 27 - 31.2 pg   MCHC 30.9 (*) 31.8 - 35.4 g/dL   RDW, POC 04.5     Platelet Count, POC 198  142 - 424 K/uL   MPV 9.1  0 - 99.8 fL  POCT URINALYSIS DIPSTICK      Result Value Range   Color, UA amber     Clarity, UA clear     Glucose, UA neg     Bilirubin, UA small     Ketones, UA trace     Spec Grav, UA >=1.030     Blood, UA neg     pH, UA 5.5     Protein, UA trace     Urobilinogen, UA 4.0     Nitrite, UA neg     Leukocytes, UA Negative     EKG reading with Dr Merla Riches - no acute changes - marked sinus arrythmia due to respiration    Assessment & Plan:  Annual physical exam - check  labs - Plan: POCT CBC, POCT urinalysis dipstick, Comprehensive metabolic panel, Lipid panel, TSH, Screening mammogram  HTN (hypertension) - will get pt back on  medications - Plan: EKG 12-Lead,  lisinopril (PRINIVIL,ZESTRIL) 10 MG tablet  Unspecified vitamin D deficiency - check labs will start supplement is needed- Plan: Vitamin D, 25-hydroxy  Degenerative joint disease - Plan: traMADol (ULTRAM) 50 MG tablet, cyclobenzaprine (FLEXERIL) 5 MG tablet, celecoxib (CELEBREX) 200 MG capsule - if she does not get improvement will do referral to ortho  Paresthesias - most likely from her MS but will check her labs - Plan: Vitamin B12  Multiple sclerosis - pt to continue her f/u with neurology - she has an appt this afternoon - she will talk with neurologist about adding Flexeril and Celebrex for her back pain.  Globus sensation - with h/o barretts esophagus will restart her PPI - in thoughts that her globus sensation is reflux - we will recheck in 1 month and if that sensation is not resolved we will do a US neck soft tissue thyroid, Plan: esomeprazole (NEXIUM) 40 MG capsule  Sinus infection - will treat will abx and hopefully that will help the PND and therefore the globus sensation, Plan: amoxicillin (AMOXIL) 875 MG tablet, Guaifenesin (MUCINEX MAXIMUM STRENGTH) 1200 MG TB12, fluconazole (DIFLUCAN) 150 MG tablet  Back pain - she has significant degenerative disease and has trouble with sleeping at night due to stiffness and pain - we will start her on celebrex (she has been on in the past) and a muscle relaxer  Toe pain, right - Plan: Ambulatory referral to Podiatry - most likely onychomycosis but pt requested a podiatrist referral -    Benny Lennert PA-C 03/17/2013 10:26 AM  I have reviewed and agree with documentation. Robert P. Merla Riches, M.D.

## 2013-03-18 LAB — VITAMIN D 25 HYDROXY (VIT D DEFICIENCY, FRACTURES): Vit D, 25-Hydroxy: <10 ng/mL — ABNORMAL LOW (ref 30–89)

## 2013-03-18 NOTE — Telephone Encounter (Signed)
Called pharmacist who reported Nexium went through today at $0 co-pay. Celebrex is covered w/out PA but co-pay is $71. Called pt and she said she is fine w/the cost of Celebrex and glad the Nexium worked out.

## 2013-03-18 NOTE — Telephone Encounter (Signed)
Can you see if prior Berkley Harvey has been requested

## 2013-03-18 NOTE — Progress Notes (Signed)
Left message for patient to call back regarding scheduling appointment. °

## 2013-03-20 ENCOUNTER — Other Ambulatory Visit: Payer: Self-pay | Admitting: Physician Assistant

## 2013-03-20 DIAGNOSIS — E538 Deficiency of other specified B group vitamins: Secondary | ICD-10-CM

## 2013-03-20 DIAGNOSIS — E559 Vitamin D deficiency, unspecified: Secondary | ICD-10-CM

## 2013-03-20 MED ORDER — VITAMIN D (ERGOCALCIFEROL) 1.25 MG (50000 UNIT) PO CAPS: 50000.0000 [IU] | ORAL_CAPSULE | ORAL | Status: DC

## 2013-03-20 MED ORDER — VITAMIN B-12 100 MCG PO TABS: 100.0000 ug | ORAL_TABLET | Freq: Every day | ORAL | Status: DC

## 2013-03-25 ENCOUNTER — Telehealth: Payer: Self-pay | Admitting: Family Medicine

## 2013-03-25 ENCOUNTER — Other Ambulatory Visit: Payer: Self-pay | Admitting: Physician Assistant

## 2013-03-25 DIAGNOSIS — Z1231 Encounter for screening mammogram for malignant neoplasm of breast: Secondary | ICD-10-CM

## 2013-03-25 NOTE — Telephone Encounter (Signed)
Message copied by Chinita Pester on Wed Mar 25, 2013  8:53 AM ------      Message from: Mancel Bale      Created: Tue Mar 17, 2013 10:54 AM       1 month recheck - pt will be thinking about the provider that she wants to see ------

## 2013-03-25 NOTE — Telephone Encounter (Signed)
Pt is requested to make payment before next visit. See note

## 2013-03-30 ENCOUNTER — Ambulatory Visit: Payer: BC Managed Care – PPO | Admitting: Podiatry

## 2013-03-31 ENCOUNTER — Encounter: Payer: Self-pay | Admitting: Podiatry

## 2013-03-31 ENCOUNTER — Ambulatory Visit (INDEPENDENT_AMBULATORY_CARE_PROVIDER_SITE_OTHER): Payer: BC Managed Care – PPO | Admitting: Podiatry

## 2013-03-31 VITALS — BP 164/87 | HR 83

## 2013-03-31 DIAGNOSIS — M2040 Other hammer toe(s) (acquired), unspecified foot: Secondary | ICD-10-CM

## 2013-03-31 DIAGNOSIS — Q828 Other specified congenital malformations of skin: Secondary | ICD-10-CM | POA: Insufficient documentation

## 2013-03-31 DIAGNOSIS — M21969 Unspecified acquired deformity of unspecified lower leg: Secondary | ICD-10-CM

## 2013-03-31 DIAGNOSIS — M79673 Pain in unspecified foot: Secondary | ICD-10-CM | POA: Insufficient documentation

## 2013-03-31 DIAGNOSIS — M79609 Pain in unspecified limb: Secondary | ICD-10-CM

## 2013-03-31 HISTORY — DX: Other hammer toe(s) (acquired), unspecified foot: M20.40

## 2013-03-31 NOTE — Patient Instructions (Signed)
Seen for painful corns and calluses. All debrided. May benefit from Flexor tenotomy 2nd digit right.  Return in 1 month.

## 2013-03-31 NOTE — Progress Notes (Signed)
Subjective: 53 year old female presents complaining of pain in both feet. Pain under the ball and big joint of right,and left foot hurts as well. 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 This bothered her feet very much. 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 She is on feet some but not constantly.   Review of Systems - General ROS: negative for - chills, fever, night sweats, sleep disturbance or weight loss Ophthalmic ROS: Getting blurred vision.  ENT ROS: negative Allergy and Immunology ROS: negative Breast ROS: negative for breast lumps Respiratory ROS: no cough, shortness of breath, or wheezing Cardiovascular ROS: no chest pain or dyspnea on exertion Gastrointestinal ROS: no abdominal pain, change in bowel habits, or black or bloody stools Genito-Urinary ROS: no dysuria, trouble voiding, or hematuria Musculoskeletal ROS: Neck and back arthritis.  Neurological ROS: Hands and arms have tingling sensations. Also diagnosed with MS 10 years ago.  Dermatological ROS: Only callus and corns.   Objective: Dermatologic: Painful digital corns and calluses bilateral.  Multiple contracted digits with digital corns 4th right over DIPJ, distal end of 2nd right, 5th left. Plantar calluses are under 3rd MPJ and first MPJ right, and 4th MPJ left. Neurovascular status are within normal. Normal response to Monofilament sensory testing.  Orthopedic: Hammer toe 2nd 4th right, Bunion right, cocked up hallux right.  Assessment: Multiple digital deformities, abnormal contracture of 1st, 2nd and 4th righ, 5th left. Severe painful clavi under 2nd digit right.  Plantar flexed metatarsal head with plantar callus under 4th right, 3rd left.   Plan: Reviewed clinical findings and available treatment options. All lesions debrided and buttress pad applied under the 2nd digit right.  Benefits of Orthotics  reviewed.  May benefit from Flexor tenotomy of 2nd digit right to relieve pressure from distal end of the 2nd digit right.  Return in 1 month.    Hammer toe 2 right, Sub 3 right, sub 4 left, HD 5 left, Bunion right , cocked up hallux right.

## 2013-04-13 NOTE — Progress Notes (Signed)
Pt will call to schedule appt or go to 102 for follow up.  Misty [name] appt availability does not work with the patients school schedule and patient only wants to see Misty [name].

## 2013-04-21 DIAGNOSIS — R209 Unspecified disturbances of skin sensation: Secondary | ICD-10-CM | POA: Insufficient documentation

## 2013-04-21 DIAGNOSIS — I10 Essential (primary) hypertension: Secondary | ICD-10-CM | POA: Insufficient documentation

## 2013-04-21 DIAGNOSIS — R519 Headache, unspecified: Secondary | ICD-10-CM | POA: Insufficient documentation

## 2013-04-21 DIAGNOSIS — C61 Malignant neoplasm of prostate: Secondary | ICD-10-CM | POA: Insufficient documentation

## 2013-04-21 DIAGNOSIS — M1990 Unspecified osteoarthritis, unspecified site: Secondary | ICD-10-CM | POA: Insufficient documentation

## 2013-04-21 DIAGNOSIS — R269 Unspecified abnormalities of gait and mobility: Secondary | ICD-10-CM | POA: Insufficient documentation

## 2013-04-21 DIAGNOSIS — R51 Headache: Secondary | ICD-10-CM

## 2013-05-05 ENCOUNTER — Ambulatory Visit: Payer: BC Managed Care – PPO | Admitting: Podiatry

## 2013-05-12 ENCOUNTER — Ambulatory Visit: Payer: Medicare Other | Admitting: Podiatry

## 2013-05-13 DIAGNOSIS — Z0271 Encounter for disability determination: Secondary | ICD-10-CM

## 2013-05-18 ENCOUNTER — Ambulatory Visit: Payer: BC Managed Care – PPO

## 2013-05-22 ENCOUNTER — Ambulatory Visit (INDEPENDENT_AMBULATORY_CARE_PROVIDER_SITE_OTHER): Payer: BC Managed Care – PPO | Admitting: Podiatry

## 2013-05-22 ENCOUNTER — Encounter: Payer: Self-pay | Admitting: Podiatry

## 2013-05-22 VITALS — BP 188/94 | HR 99 | Ht 66.0 in | Wt 217.0 lb

## 2013-05-22 DIAGNOSIS — M21969 Unspecified acquired deformity of unspecified lower leg: Secondary | ICD-10-CM

## 2013-05-22 DIAGNOSIS — M79673 Pain in unspecified foot: Secondary | ICD-10-CM

## 2013-05-22 DIAGNOSIS — M79609 Pain in unspecified limb: Secondary | ICD-10-CM

## 2013-05-22 DIAGNOSIS — Q828 Other specified congenital malformations of skin: Secondary | ICD-10-CM

## 2013-05-22 DIAGNOSIS — M79606 Pain in leg, unspecified: Secondary | ICD-10-CM

## 2013-05-22 NOTE — Patient Instructions (Signed)
Today X-rays taken, casted for Orthotics, and all nails and calluses debrided. Will call patient when orthotics are ready.

## 2013-05-22 NOTE — Progress Notes (Signed)
Pain in distal tip 2nd toe right and callus under right foot bunion.   X-ray: Short first right, status post hemiphalangectomy 5th right. Arthritic change with hallux valgus first MPJ right, Fibular sesamoid position at 5. Casted for Orthotics. All calluses debrided. All nails debrided.

## 2013-06-18 DIAGNOSIS — G932 Benign intracranial hypertension: Secondary | ICD-10-CM | POA: Insufficient documentation

## 2013-06-19 ENCOUNTER — Other Ambulatory Visit: Payer: Self-pay | Admitting: Physician Assistant

## 2013-07-21 ENCOUNTER — Other Ambulatory Visit: Payer: Self-pay | Admitting: Physician Assistant

## 2013-07-24 ENCOUNTER — Ambulatory Visit: Payer: BC Managed Care – PPO | Admitting: Neurology

## 2013-07-30 ENCOUNTER — Ambulatory Visit (INDEPENDENT_AMBULATORY_CARE_PROVIDER_SITE_OTHER): Payer: BC Managed Care – PPO | Admitting: Neurology

## 2013-07-30 ENCOUNTER — Encounter: Payer: Self-pay | Admitting: Neurology

## 2013-07-30 VITALS — BP 140/100 | HR 80 | Temp 98.0°F | Resp 18 | Ht 66.0 in | Wt 225.6 lb

## 2013-07-30 DIAGNOSIS — G35 Multiple sclerosis: Secondary | ICD-10-CM

## 2013-07-30 NOTE — Progress Notes (Signed)
NEUROLOGY CONSULTATION NOTE  Misty [name] MRN: [id number] DOB: [birth year]  Referring provider: Dr. Jannifer Franklin Primary care provider: Georgiann Mccoy, PA  Reason for consult:  MS  HISTORY OF PRESENT ILLNESS: Misty [name] is a right year old right-handed AA woman with history of MS, idiopathic intracranial hypertension, gastric bypass who presents to establish care regarding multiple sclerosis.  She was previously followed at [facility] and later by Dr. Latanya Maudlin in [facility].  Outside records personally reviewed.  Actual MRI scans are not available to review.  Onset:  2004.  She developed dizziness and headache, described at the time as sharp and shooting bitemporal pain, occurring several times a week.  The dizzy spells would come on suddenly and even cause her to fall.  She saw Dr. Jacolyn Reedy at [facility] who performed MRI of the brain with and without contrast, performed 11/05/02, which revealed nonspecific rare subtle white matter hyperintensities in the subcortical white matter.  She underwent a lumbar puncture on 11/19/02.  She had an opening pressure of 23 cm H2O.  CSF did show oligoclonal bands, but the actual results are not available to me.    MRI of the brain with and without contrast performed 12/26/06 revealed multiple hyperintensities in the subcortical white matter with no abnormal enhancement.  It was not compared to prior images, however.  Incidentally, the cerebellar tonsils were 4-5 mm below the foramen magnum.  MRI of the cervical spine with and without contrast performed 06/17/07 no demyelinating lesion but did reveal focal central disc bulge at C5-6 with slight displacement of the cord, but no significant cord compression.  MRI of the lumbar spine revealed degenerative changes  MRI of the brain with and without contrast performed 12/22/12 revealed again nonspecific periventricular, subcortical and juxtacortical hyperintensities with no abnormal enhancement.  It does not make mention of  comparison to prior scans.  Repeat Lumbar puncture was performed on 02/03/13.   Opening pressure was slightly elevated at 24.5 cm H2O.  CSF revealed cell count 0, glucose 57, protein 26, 2 oligoclonal bands, IgG index 0.6, myelin basic protein less than 2.0  Initially she was on Betaseron for 6 months.  It was discontinued because of increased liver enzymes.  She was then started on Copaxone, which she took for 4 or 5 years.  She discontinued it about 3.5 years ago due to loss of insurance.  Denies need for IV steroids in the past.  Chronic symptomatology: Diffuse weakness.  Reports that she has had falls because her legs feel weak. Fatigue.  Used to be on Provigil. It doesn't seem to be too much of a problem at this time. Muscle spasms and myalgias.  Takes cyclobenzaprine, which helps. Dizziness.  Takes tramadol which helps. Blurred vision No incontinence. Most troublesome are burning painful paresthesias involving the arms (left more than right) and occasionally the right leg.  She tried gabapentin, which was ineffective.  Previously, she tried amitriptyline, which caused hallucinations.  PAST MEDICAL HISTORY: Past Medical History  Diagnosis Date  . Hypertension   . MS (multiple sclerosis)   . Colon cancer   . Back pain   . Umbilical hernia     PAST SURGICAL HISTORY: Past Surgical History  Procedure Laterality Date  . Abdominal hysterectomy    . Colon surgery    . Cesarean section    . Foot surgery      MEDICATIONS: Current Outpatient Prescriptions on File Prior to Visit  Medication Sig Dispense Refill  . aspirin 81 MG tablet Take  81 mg by mouth daily.        . celecoxib (CELEBREX) 200 MG capsule Take 1 capsule (200 mg total) by mouth daily.  30 capsule  1  . cyclobenzaprine (FLEXERIL) 5 MG tablet TAKE ONE TABLET BY MOUTH AT BEDTIME  30 tablet  1  . esomeprazole (NEXIUM) 40 MG capsule Take 1 capsule (40 mg total) by mouth daily.  30 capsule  1  . fluconazole (DIFLUCAN) 150  MG tablet Take 1 tablet (150 mg total) by mouth once. Repeat in 1 week is needed  2 tablet  0  . lisinopril (PRINIVIL,ZESTRIL) 10 MG tablet TAKE ONE TABLET BY MOUTH ONCE DAILY (PATIENT  NEEDS  OFFICE  VISIT  FOR  ADDITIONAL  REFILLS)  30 tablet  1  . traMADol (ULTRAM) 50 MG tablet Take 1 tablet (50 mg total) by mouth every 8 (eight) hours as needed.  30 tablet  0  . vitamin B-12 (CYANOCOBALAMIN) 100 MCG tablet Take 1 tablet (100 mcg total) by mouth daily.  30 tablet  2  . Vitamin D, Ergocalciferol, (DRISDOL) 50000 UNITS CAPS capsule Take 1 capsule (50,000 Units total) by mouth every 7 (seven) days.  12 capsule  0  . gabapentin (NEURONTIN) 300 MG capsule        No current facility-administered medications on file prior to visit.    ALLERGIES: Allergies  Allergen Reactions  . Phenergan [Promethazine Hcl] Anxiety    FAMILY HISTORY: Family History  Problem Relation Age of Onset  . Stroke Mother   . Hypertension Mother   . Hyperlipidemia Mother   . Diabetes Mother   . Diabetes Brother   . Hypertension Brother   . Hypertension Brother   . Hypertension Brother   . Hypertension Brother   . Hypertension Brother   . Alcoholism Brother     SOCIAL HISTORY: History   Social History  . Marital Status: Single    Spouse Name: N/A    Number of Children: N/A  . Years of Education: N/A   Occupational History  . Not on file.   Social History Main Topics  . Smoking status: Current Some Day Smoker -- 0.25 packs/day for 20 years    Types: Cigarettes  . Smokeless tobacco: Never Used  . Alcohol Use: Yes     Comment: occasional  . Drug Use: No  . Sexual Activity: Yes    Partners: Male   Other Topics Concern  . Not on file   Social History Narrative  . No narrative on file    REVIEW OF SYSTEMS: Constitutional: Fatigue Eyes: Blurred vision Ear, nose and throat: No hearing loss, ear pain, nasal congestion, sore throat Cardiovascular: No chest pain, palpitations Respiratory:  No  shortness of breath at rest or with exertion, wheezes GastrointestinaI: IBS Genitourinary:  No dysuria, urinary retention or frequency Musculoskeletal:  No neck pain, back pain Integumentary: No rash, pruritus, skin lesions Neurological: as above Psychiatric: No depression, insomnia, anxiety Endocrine: No palpitations, fatigue, diaphoresis, mood swings, change in appetite, change in weight, increased thirst Hematologic/Lymphatic:  No anemia, purpura, petechiae. Allergic/Immunologic: no itchy/runny eyes, nasal congestion, recent allergic reactions, rashes  PHYSICAL EXAM: Filed Vitals:   07/30/13 1150  BP: 140/100  Pulse: 80  Temp: 98 F (36.7 C)  Resp: 18   General: No acute distress Head:  Normocephalic/atraumatic Neck: supple, mild bilateral tenderness, full range of motion.  No Lhermitte's sign. Back: No paraspinal tenderness Heart: regular rate and rhythm Lungs: Clear to auscultation bilaterally. Vascular: No carotid  bruits. Neurological Exam: Mental status: alert and oriented to person, place, and time, recent and remote memory intact, fund of knowledge intact, attention and concentration intact, speech fluent and not dysarthric, language intact. Cranial nerves: CN I: not tested CN II: pupils equal, round and reactive to light, visual fields intact, fundi unremarkable, without vessel changes, exudates, hemorrhages or papilledema. CN III, IV, VI:  full range of motion, no nystagmus, no ptosis CN V: facial sensation intact CN VII: upper and lower face symmetric CN VIII: hearing intact CN IX, X: gag intact, uvula midline CN XI: sternocleidomastoid and trapezius muscles intact CN XII: tongue midline Bulk & Tone: normal, no fasciculations. Motor: 5/5 throughout Sensation: pinprick and vibration intact. Deep Tendon Reflexes: 1+ throughout, except absent in ankles, toes downgoing. Finger to nose testing: no dysmetria Heel to shin: no dysmetria Gait: normal station and  stride.  Able to turn.  Could not walk on toes due to pain.  Able to walk on heels and in tandem. Romberg negative.  IMPRESSION: Multiple sclerosis.  Unsure of disease progression, as MRI reports do not make comparisons to prior images.  Does not really sound like she has had history of relapsing-remitting course.  PLAN: 1.  Will have her get MRI scans from 2008 and 2014 on a CD. Will repeat MRI and have her bring CD to radiology to compare. 2.  Lyrica 75mg  twice daily for neuropathic pain.  Will provide samples and co-pay card. 3.  Will try to get her set up with Copaxone. 4.  Check baseline CBC with diff, CMP and vitamin D 5.  Follow up in 3 months.  60 minutes spent with patient, over 50% spent reviewing outside records, counseling and coordinating care.  Thank you for allowing me to take part in the care of this patient.  Metta Clines, DO  CC:  Georgiann Mccoy, [place]

## 2013-07-30 NOTE — Patient Instructions (Addendum)
1.  Get the CD of your brain MRI from 12/26/06 and 12/22/12.  We will schedule another brain MRI.  Bring the CD of the old MRIs so the radiologist can compare with the prior MRIs on the CD. [place] Saturday 16 10:00am  [place]  We will try and set up the Copaxone 3.  We will get blood work. 4.  Try Lyrica for the burning arm pain.  Take 75mg  twice daily (once in morning and once at night/bedtime).  We will provide you samples and a Co-Pay card to help with the expenses. 5.  Follow up in 3 months.

## 2013-08-01 ENCOUNTER — Ambulatory Visit (HOSPITAL_BASED_OUTPATIENT_CLINIC_OR_DEPARTMENT_OTHER): Payer: BC Managed Care – PPO

## 2013-08-03 ENCOUNTER — Telehealth: Payer: Self-pay | Admitting: Neurology

## 2013-08-03 NOTE — Telephone Encounter (Signed)
Pt went to get the cd for the

## 2013-08-04 ENCOUNTER — Telehealth: Payer: Self-pay | Admitting: *Deleted

## 2013-08-04 ENCOUNTER — Other Ambulatory Visit: Payer: Self-pay | Admitting: *Deleted

## 2013-08-04 DIAGNOSIS — G35 Multiple sclerosis: Secondary | ICD-10-CM

## 2013-08-04 LAB — CBC WITH DIFFERENTIAL/PLATELET
Basophils Absolute: 0 10*3/uL (ref 0.0–0.1)
Basophils Relative: 1 % (ref 0–1)
Eosinophils Absolute: 0.1 10*3/uL (ref 0.0–0.7)
Eosinophils Relative: 2 % (ref 0–5)
HCT: 35.7 % — ABNORMAL LOW (ref 36.0–46.0)
Hemoglobin: 11.6 g/dL — ABNORMAL LOW (ref 12.0–15.0)
Lymphocytes Relative: 33 % (ref 12–46)
Lymphs Abs: 1.4 10*3/uL (ref 0.7–4.0)
MCH: 34.4 pg — ABNORMAL HIGH (ref 26.0–34.0)
MCHC: 32.5 g/dL (ref 30.0–36.0)
MCV: 105.9 fL — ABNORMAL HIGH (ref 78.0–100.0)
Monocytes Absolute: 0.3 10*3/uL (ref 0.1–1.0)
Monocytes Relative: 7 % (ref 3–12)
Neutro Abs: 2.4 10*3/uL (ref 1.7–7.7)
Neutrophils Relative %: 57 % (ref 43–77)
Platelets: 208 10*3/uL (ref 150–400)
RBC: 3.37 MIL/uL — ABNORMAL LOW (ref 3.87–5.11)
RDW: 15.6 % — ABNORMAL HIGH (ref 11.5–15.5)
WBC: 4.2 10*3/uL (ref 4.0–10.5)

## 2013-08-04 LAB — COMPLETE METABOLIC PANEL WITH GFR
ALT: 20 U/L (ref 0–35)
AST: 40 U/L — ABNORMAL HIGH (ref 0–37)
Albumin: 3.8 g/dL (ref 3.5–5.2)
Alkaline Phosphatase: 64 U/L (ref 39–117)
BUN: 8 mg/dL (ref 6–23)
CO2: 24 mEq/L (ref 19–32)
Calcium: 8.6 mg/dL (ref 8.4–10.5)
Chloride: 107 mEq/L (ref 96–112)
Creat: 0.49 mg/dL — ABNORMAL LOW (ref 0.50–1.10)
GFR, Est African American: >89 mL/min
GFR, Est Non African American: >89 mL/min
Glucose, Bld: 78 mg/dL (ref 70–99)
Potassium: 3.7 mEq/L (ref 3.5–5.3)
Sodium: 140 mEq/L (ref 135–145)
Total Bilirubin: 0.6 mg/dL (ref 0.2–1.2)
Total Protein: 6.8 g/dL (ref 6.0–8.3)

## 2013-08-04 LAB — VITAMIN D 25 HYDROXY (VIT D DEFICIENCY, FRACTURES): Vit D, 25-Hydroxy: <10 ng/mL — ABNORMAL LOW (ref 30–89)

## 2013-08-04 LAB — BUN: BUN: 8 mg/dL (ref 6–23)

## 2013-08-04 LAB — CREATININE, SERUM: Creat: 0.49 mg/dL — ABNORMAL LOW (ref 0.50–1.10)

## 2013-08-04 NOTE — Telephone Encounter (Signed)
Patient is aware that she has appt 08/12/13 at [place] [facility] for MRI  She is also aware of Normal labs with the exception of  Mild anemia

## 2013-08-12 ENCOUNTER — Inpatient Hospital Stay: Admission: RE | Admit: 2013-08-12 | Payer: BC Managed Care – PPO | Source: Ambulatory Visit

## 2013-08-20 ENCOUNTER — Ambulatory Visit
Admission: RE | Admit: 2013-08-20 | Discharge: 2013-08-20 | Disposition: A | Discharge status: Home / Self Care | Payer: Medicare Other | Source: Ambulatory Visit | Attending: Neurology | Admitting: Neurology

## 2013-08-20 DIAGNOSIS — G35 Multiple sclerosis: Secondary | ICD-10-CM

## 2013-08-20 MED ORDER — GADOBENATE DIMEGLUMINE 529 MG/ML IV SOLN
20.0000 mL | Freq: Once | INTRAVENOUS | Status: AC | PRN
  Administered 2013-08-20: 20 mL via INTRAVENOUS

## 2013-08-21 ENCOUNTER — Other Ambulatory Visit: Payer: Self-pay | Admitting: *Deleted

## 2013-08-21 ENCOUNTER — Other Ambulatory Visit: Payer: Self-pay | Admitting: Neurology

## 2013-08-21 ENCOUNTER — Telehealth: Payer: Self-pay | Admitting: *Deleted

## 2013-08-21 DIAGNOSIS — G35 Multiple sclerosis: Secondary | ICD-10-CM

## 2013-08-21 LAB — CBC WITH DIFFERENTIAL/PLATELET
Basophils Absolute: 0 10*3/uL (ref 0.0–0.1)
Basophils Relative: 0 % (ref 0–1)
Eosinophils Absolute: 0.1 10*3/uL (ref 0.0–0.7)
Eosinophils Relative: 1 % (ref 0–5)
HCT: 36.7 % (ref 36.0–46.0)
Hemoglobin: 12.6 g/dL (ref 12.0–15.0)
Lymphocytes Relative: 21 % (ref 12–46)
Lymphs Abs: 1.4 10*3/uL (ref 0.7–4.0)
MCH: 35.7 pg — ABNORMAL HIGH (ref 26.0–34.0)
MCHC: 34.3 g/dL (ref 30.0–36.0)
MCV: 104 fL — ABNORMAL HIGH (ref 78.0–100.0)
Monocytes Absolute: 0.5 10*3/uL (ref 0.1–1.0)
Monocytes Relative: 7 % (ref 3–12)
Neutro Abs: 4.7 10*3/uL (ref 1.7–7.7)
Neutrophils Relative %: 71 % (ref 43–77)
Platelets: 244 10*3/uL (ref 150–400)
RBC: 3.53 MIL/uL — ABNORMAL LOW (ref 3.87–5.11)
RDW: 15 % (ref 11.5–15.5)
WBC: 6.6 10*3/uL (ref 4.0–10.5)

## 2013-08-21 LAB — COMPREHENSIVE METABOLIC PANEL
ALT: 18 U/L (ref 0–35)
AST: 28 U/L (ref 0–37)
Albumin: 3.7 g/dL (ref 3.5–5.2)
Alkaline Phosphatase: 70 U/L (ref 39–117)
BUN: 8 mg/dL (ref 6–23)
CO2: 27 mEq/L (ref 19–32)
Calcium: 9 mg/dL (ref 8.4–10.5)
Chloride: 105 mEq/L (ref 96–112)
Creat: 0.51 mg/dL (ref 0.50–1.10)
Glucose, Bld: 88 mg/dL (ref 70–99)
Potassium: 3.9 mEq/L (ref 3.5–5.3)
Sodium: 139 mEq/L (ref 135–145)
Total Bilirubin: 0.8 mg/dL (ref 0.2–1.2)
Total Protein: 6.6 g/dL (ref 6.0–8.3)

## 2013-08-21 NOTE — Telephone Encounter (Signed)
Error

## 2013-08-24 ENCOUNTER — Telehealth: Payer: Self-pay | Admitting: *Deleted

## 2013-08-24 NOTE — Telephone Encounter (Signed)
Message copied by Claudie Revering on Mon Aug 24, 2013  8:37 AM ------      Message from: JAFFE, ADAM R      Created: Mon Aug 24, 2013  6:22 AM       Cbc and cmp look unremarkable.      ----- Message -----         From: Lab in [facility] Interface         Sent: 08/21/2013   8:49 PM           To: Dudley Major, DO                   ------

## 2013-09-17 ENCOUNTER — Ambulatory Visit (INDEPENDENT_AMBULATORY_CARE_PROVIDER_SITE_OTHER): Payer: Medicare Other | Admitting: Podiatry

## 2013-09-17 ENCOUNTER — Encounter: Payer: Self-pay | Admitting: Podiatry

## 2013-09-17 VITALS — BP 147/87 | HR 83

## 2013-09-17 DIAGNOSIS — M79609 Pain in unspecified limb: Secondary | ICD-10-CM

## 2013-09-17 DIAGNOSIS — M21969 Unspecified acquired deformity of unspecified lower leg: Secondary | ICD-10-CM

## 2013-09-17 DIAGNOSIS — M2040 Other hammer toe(s) (acquired), unspecified foot: Secondary | ICD-10-CM

## 2013-09-17 DIAGNOSIS — M79606 Pain in leg, unspecified: Secondary | ICD-10-CM

## 2013-09-17 DIAGNOSIS — Q828 Other specified congenital malformations of skin: Secondary | ICD-10-CM

## 2013-09-17 NOTE — Progress Notes (Signed)
Subjective: 53 year old female presents complaining of painful calluses.  Very painful to walk.  Objection:  Severe hallux valgus with bunion bilateral. Severe contracted lesser digits with digital corns. Severe plantar calluses under 3rd MPJ area bilateral. Neurovascular status are within normal.  Assessment: Intractable porokeratosis bilateral. Deformed Metatarsal bilateral. Hammer toe deformity bilateral. HAV with bunion bilateral.   Plan: All keratotic lesions debrided. Available treatment options reviewed. Patient will return to discuss pre-op.

## 2013-09-17 NOTE — Patient Instructions (Signed)
Seen for painful calluses on both feet. All debrided. Return for pre - op.

## 2013-09-29 ENCOUNTER — Ambulatory Visit: Payer: Medicare Other | Admitting: Podiatry

## 2013-10-12 ENCOUNTER — Telehealth: Payer: Self-pay

## 2013-10-12 MED ORDER — LISINOPRIL 10 MG PO TABS: ORAL_TABLET | ORAL | Status: DC

## 2013-10-12 NOTE — Telephone Encounter (Signed)
Pt needs BP med refilled and tramadol.  [facility] [phone]

## 2013-10-12 NOTE — Telephone Encounter (Signed)
Sent in 2 weeks of lisinopril and notified pt of RF and need for f/up for more and for tramadol. Pt agreed and she has Sarah's schedule for this weekend.

## 2013-11-02 ENCOUNTER — Ambulatory Visit: Payer: BC Managed Care – PPO | Admitting: Neurology

## 2013-11-05 ENCOUNTER — Telehealth: Payer: Self-pay | Admitting: Neurology

## 2013-11-05 NOTE — Telephone Encounter (Signed)
Pt called requesting to speak to a nurse regarding a couple of forms.  C/B [phone]

## 2013-11-12 ENCOUNTER — Telehealth: Payer: Self-pay | Admitting: Neurology

## 2013-11-12 NOTE — Telephone Encounter (Signed)
Pt needs to talk to someone about forms please call back at [phone]

## 2013-11-12 NOTE — Telephone Encounter (Signed)
Left message for patient forms are at front desk

## 2013-11-13 ENCOUNTER — Ambulatory Visit (INDEPENDENT_AMBULATORY_CARE_PROVIDER_SITE_OTHER): Payer: Medicare Other | Admitting: Physician Assistant

## 2013-11-13 VITALS — BP 138/88 | HR 73 | Temp 98.0°F | Resp 17 | Ht 66.0 in | Wt 225.0 lb

## 2013-11-13 DIAGNOSIS — F3289 Other specified depressive episodes: Secondary | ICD-10-CM

## 2013-11-13 DIAGNOSIS — IMO0002 Reserved for concepts with insufficient information to code with codable children: Secondary | ICD-10-CM

## 2013-11-13 DIAGNOSIS — M1710 Unilateral primary osteoarthritis, unspecified knee: Secondary | ICD-10-CM

## 2013-11-13 DIAGNOSIS — F329 Major depressive disorder, single episode, unspecified: Secondary | ICD-10-CM

## 2013-11-13 DIAGNOSIS — F32A Depression, unspecified: Secondary | ICD-10-CM

## 2013-11-13 DIAGNOSIS — I10 Essential (primary) hypertension: Secondary | ICD-10-CM

## 2013-11-13 MED ORDER — LISINOPRIL 10 MG PO TABS: ORAL_TABLET | ORAL | Status: DC

## 2013-11-13 MED ORDER — DULOXETINE HCL 30 MG PO CPEP: 30.0000 mg | ORAL_CAPSULE | Freq: Every day | ORAL | Status: DC

## 2013-11-13 MED ORDER — TRAMADOL HCL 50 MG PO TABS: 50.0000 mg | ORAL_TABLET | Freq: Three times a day (TID) | ORAL | Status: DC | PRN

## 2013-11-13 NOTE — Progress Notes (Signed)
   Subjective:    Patient ID: Misty [name], female    DOB: 08-Jun-1960, 53 y.o.   MRN: [id number]  HPI Pt presents to clinic for medication refill. She is having some other problems but she does not have time to stay today for evaluation.  She is doing well on her medications but has run out of them.  She has an appt with the neurologist today for her MS.  Review of Systems  Respiratory: Negative for chest tightness.   Cardiovascular: Negative for chest pain.       Objective:   Physical Exam  Vitals reviewed. Constitutional: She is oriented to person, place, and time. She appears well-developed and well-nourished.  HENT:  Head: Normocephalic and atraumatic.  Right Ear: External ear normal.  Left Ear: External ear normal.  Cardiovascular: Normal rate, regular rhythm and normal heart sounds.   Pulmonary/Chest: Effort normal and breath sounds normal.  Neurological: She is alert and oriented to person, place, and time.  Skin: Skin is warm and dry.  Psychiatric: She has a normal mood and affect. Her behavior is normal. Judgment and thought content normal.       Assessment & Plan:  Essential hypertension - Plan: lisinopril (PRINIVIL,ZESTRIL) 10 MG tablet  Depression - Plan: DULoxetine (CYMBALTA) 30 MG capsule  OSTEOARTHRITIS, KNEES, BILATERAL - Plan: traMADol (ULTRAM) 50 MG tablet  Refilled her medications but due to her visit with neurologist we will refill for a month and then get her set up with an appt next door next month for labs and a recheck.  I have put an order in for her labs if she would like to come have them done before her appt.  Windell Hummingbird PA-C  [facility] 11/13/2013 4:49 PM

## 2013-11-17 ENCOUNTER — Telehealth: Payer: Self-pay | Admitting: Family Medicine

## 2013-11-17 NOTE — Telephone Encounter (Signed)
Message copied by Chinita Pester on Tue Nov 17, 2013 11:57 AM ------      Message from: Mancel Bale      Created: Fri Nov 13, 2013  2:47 PM       1 month recheck HTN, depression ------

## 2013-11-17 NOTE — Telephone Encounter (Signed)
Pt will see Judson Roch at walk-in

## 2013-12-01 ENCOUNTER — Encounter: Payer: Self-pay | Admitting: Neurology

## 2013-12-01 ENCOUNTER — Ambulatory Visit (INDEPENDENT_AMBULATORY_CARE_PROVIDER_SITE_OTHER): Payer: Medicare Other | Admitting: Neurology

## 2013-12-01 VITALS — BP 130/76 | HR 72 | Temp 97.8°F | Resp 18 | Wt 225.3 lb

## 2013-12-01 DIAGNOSIS — M542 Cervicalgia: Secondary | ICD-10-CM

## 2013-12-01 DIAGNOSIS — G379 Demyelinating disease of central nervous system, unspecified: Secondary | ICD-10-CM

## 2013-12-01 MED ORDER — MODAFINIL 200 MG PO TABS: ORAL_TABLET | ORAL | Status: DC

## 2013-12-01 NOTE — Patient Instructions (Addendum)
1.  We will start Provigil 200mg  every morning for fatigue 2.  Continue Lyrica 75mg  twice daily for now until you get the Cymbalta 3.  We will get MRI of the cervical spine with and without contrast.  If the MRI of the cervical spine is okay, I may want to send you to [facility] center for second opinion regarding diagnosis . [facility] 12/04/13  4:45pm  4.  Take vitamin D3 4000 units daily 5.  Continue Copaxone. 7.  We will refer you for functional assessment at neurorehab 8.  Follow up in 3 months.

## 2013-12-01 NOTE — Progress Notes (Signed)
NEUROLOGY FOLLOW UP OFFICE NOTE  Misty [name] [id number]  HISTORY OF PRESENT ILLNESS: Misty [name] is a 53 year old right-handed AA woman with history of MS, idiopathic intracranial hypertension, gastric bypass who presents to establish care regarding multiple sclerosis Misty [name] is a 75 right year old right-handed AA woman with history of MS, idiopathic intracranial hypertension, gastric bypass who follows up for multiple sclerosis.  UPDATE: She had a follow-up MRI of the brain with and without contrast performed on 08/20/13, which revealed stable non-enhancing non-specific white matter hyperintensities when compared to prior study from 12/22/13.  She was started on Lyrica 75mg  twice daily for neuropathic pain.  At first, she thought it was causing extreme fatigue, so it was switched to Cymbalta.  She hasn't started the Cymbalta due to switch in insurance and cannot get it filled until October.  She was off Lyrica for a little while but the fatigue continued.  She restarted Lyrica until she will be able to get the Cymbalta.  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  Beginning this summer, she has had increased fatigue and increased difficulty concentrating in school.  She also reports increased pain involving the left hand and arm, causing weakness and difficulty using her hand.  08/21/13 LABS:  WBC 6.6, HGB 12.6, HCT 36.7, PLT 244, Na 140, K 3.7, BUN 8, Cr 0.49, TB 0.6, ALP 64, AST 40, ALT 20, Vit D 25-hydroxy less than 10  HISTORY: Onset:  2004.  She developed dizziness and headache, described at the time as sharp and shooting bitemporal pain, occurring several times a week.  The dizzy spells would come on suddenly and even cause her to fall.  She saw Dr. Jacolyn Reedy at [facility] who performed MRI of the brain with and without contrast, performed 11/05/02, which revealed nonspecific rare subtle white matter hyperintensities in the subcortical white  matter.  She underwent a lumbar puncture on 11/19/02.  She had an opening pressure of 23 cm H2O.  CSF did show oligoclonal bands, but the actual results are not available to me.    MRI of the brain with and without contrast performed 12/26/06 revealed multiple hyperintensities in the subcortical white matter with no abnormal enhancement.  It was not compared to prior images, however.  Incidentally, the cerebellar tonsils were 4-5 mm below the foramen magnum.  MRI of the cervical spine with and without contrast performed 06/17/07 no demyelinating lesion but did reveal focal central disc bulge at C5-6 with slight displacement of the cord, but no significant cord compression.  MRI of the lumbar spine revealed degenerative changes  MRI of the brain with and without contrast performed 12/24/12 revealed again nonspecific periventricular, subcortical and juxtacortical hyperintensities with no abnormal enhancement.  It does not make mention of comparison to prior scans.  12/24/12 MRI BRAIN W/WO: "few periventricular, subcortical and juxtacortical foci of non-specific gliosis.  No abnormal lesions seen on post contrast views."  03/17/13 LABS:  TSH 0.705, Vit D 25-hydroxy <10, B12 174, WBC 6.2, HGB 13.3, HCT 43.3, PLT 198, Na 138, K 3.8, BUN 8, Cr 0.54, TB 1, AP 78, AST 57, ALT 29, TP 7.7, ALB 4.2, Ca 9.6 04/21/13 CSF: OP 24.5, OCB 2  Repeat Lumbar puncture was performed on 02/03/13.   Opening pressure was slightly elevated at 24.5 cm H2O.  CSF revealed cell count 0, glucose 57, protein 26, 2 oligoclonal bands, IgG index 0.6, myelin basic protein less than 2.0  Initially she  was on Betaseron for 6 months.  It was discontinued because of increased liver enzymes.  She was then started on Copaxone, which she took for 4 or 5 years.  She discontinued it about 3.5 years ago due to loss of insurance.  Denies need for IV steroids in the past.  As per her history, she never seemed to have had any actual flare-up.  She has had  chronic symptoms over the years: Diffuse weakness.  Reports that she has had falls because her legs feel weak. Fatigue.  Used to be on Provigil. Fatigue wasn't an issue until this summer. Muscle spasms and myalgias.  Takes cyclobenzaprine, which helps. Dizziness.  Takes tramadol which helps. Blurred vision No incontinence. Most troublesome are burning painful paresthesias involving the arms (left more than right) and occasionally the right leg.  She also experiences shooting pain down the back.  She tried gabapentin, which was ineffective.  Previously, she tried amitriptyline, which caused hallucinations. Disease-modifying agent:  Copaxone.  Previously:  Betaseron (side effects) Past medications:  gabapentin (felt goofy), Cymbalta (ineffective), amitriptyline (hallucinations) Current medications:  cyclobenzaprine 5mg  at bedtime, vitamin D, Lyrica 75mg  twice daily  PAST MEDICAL HISTORY: Past Medical History  Diagnosis Date  . Hypertension   . MS (multiple sclerosis)   . Colon cancer   . Back pain   . Umbilical hernia     MEDICATIONS: Current Outpatient Prescriptions on File Prior to Visit  Medication Sig Dispense Refill  . lisinopril (PRINIVIL,ZESTRIL) 10 MG tablet TAKE ONE TABLET BY MOUTH ONCE DAILY  30 tablet  5  . DULoxetine (CYMBALTA) 30 MG capsule Take 1 capsule (30 mg total) by mouth daily. 1 PO HS  # 30 with 2 refills  30 capsule  5  . Glatiramer Acetate (COPAXONE) 40 MG/ML SOSY Inject 40 mg into the skin as directed. 3x/week      . traMADol (ULTRAM) 50 MG tablet Take 1 tablet (50 mg total) by mouth every 8 (eight) hours as needed.  30 tablet  0   No current facility-administered medications on file prior to visit.    ALLERGIES: Allergies  Allergen Reactions  . Phenergan [Promethazine Hcl] Anxiety    FAMILY HISTORY: Family History  Problem Relation Age of Onset  . Stroke Mother   . Hypertension Mother   . Hyperlipidemia Mother   . Diabetes Mother   . Diabetes  Brother   . Hypertension Brother   . Hypertension Brother   . Hypertension Brother   . Hypertension Brother   . Hypertension Brother   . Alcoholism Brother     SOCIAL HISTORY: History   Social History  . Marital Status: Single    Spouse Name: N/A    Number of Children: N/A  . Years of Education: N/A   Occupational History  . Not on file.   Social History Main Topics  . Smoking status: Current Some Day Smoker -- 0.25 packs/day for 20 years    Types: Cigarettes  . Smokeless tobacco: Never Used  . Alcohol Use: Yes     Comment: occasional  . Drug Use: No  . Sexual Activity: Yes    Partners: Male   Other Topics Concern  . Not on file   Social History Narrative  . No narrative on file    REVIEW OF SYSTEMS: Constitutional: Generalized fatigue.  No fevers, chills, or sweats, or change in appetite Eyes: No visual changes, double vision, eye pain Ear, nose and throat: No hearing loss, ear pain, nasal congestion, sore  throat Cardiovascular: No chest pain, palpitations Respiratory:  No shortness of breath at rest or with exertion, wheezes GastrointestinaI: No nausea, vomiting, diarrhea, abdominal pain, fecal incontinence Genitourinary:  No dysuria, urinary retention or frequency Musculoskeletal:  Neck and back pain. Integumentary: No rash, pruritus, skin lesions Neurological: as above Psychiatric: Trouble sleeping Endocrine: No palpitations, fatigue, diaphoresis, mood swings, change in appetite, change in weight, increased thirst Hematologic/Lymphatic:  No anemia, purpura, petechiae. Allergic/Immunologic: no itchy/runny eyes, nasal congestion, recent allergic reactions, rashes  PHYSICAL EXAM: Filed Vitals:   12/01/13 1447  BP: 130/76  Pulse: 72  Temp: 97.8 F (36.6 C)  Resp: 18   General: No acute distress Head:  Normocephalic/atraumatic Neck: supple, no paraspinal tenderness, full range of motion Heart:  Regular rate and rhythm Lungs:  Clear to auscultation  bilaterally Back: No paraspinal tenderness Neurological Exam: alert and oriented to person, place, and time. Attention span and concentration intact, recent and remote memory intact, fund of knowledge intact.  Speech fluent and not dysarthric, language intact.  CN II-XII intact. Fundoscopic exam unremarkable without vessel changes, exudates, hemorrhages or papilledema.  Bulk and tone normal, muscle strength 5/5 throughout.  Sensation to light touch, temperature and vibration intact.  Notes hyperesthesia involving the left upper extremity.  Deep tendon reflexes 2+ throughout, toes downgoing.  Finger to nose and heel to shin testing intact.  Gait normal, Romberg negative.  IMPRESSION: History of MS.  This may be MS, but at this point, she really doesn't fit the McDonald Criteria for MS.  She has never had an actual MS flare.  Instead, she has suffered chronic vague symptoms such as pain and fatigue.  She has had multiple MRIs in the past, but I don't have the actual scans and prior reports never mentioned if there was any progression.  Comparing the last two MRIs (from Oct 2014 and Sept 2015), there is no change and the pattern of the hyperintensities are non-specific.   PLAN: 1.  Since she is complaining of increased pain and tingling involving the left arm and hand, we will get MRI of the cervical spine to evaluate for any new lesions. 2.  For fatigue, will initiate Provigil 3.  Will continue Lyrica 75mg  twice daily for now.  Will try Cymbalta when available, or we can increase Lyrica 4.  Will refer to Neuro[facility] for functional assessment regarding any functional limitations (as requested by her school). 5.  May consider referral for second opinion at the [facility] in [facility] regarding diagnosis  6.  Continue Copaxone 7.  D3 4000 IU daily 8.  Follow up in 3 months  Metta Clines, DO  CC:  Windell Hummingbird, PA-C

## 2013-12-04 ENCOUNTER — Ambulatory Visit (HOSPITAL_COMMUNITY): Admission: RE | Admit: 2013-12-04 | Payer: Medicare Other | Source: Ambulatory Visit

## 2013-12-18 ENCOUNTER — Telehealth: Payer: Self-pay | Admitting: Neurology

## 2013-12-18 ENCOUNTER — Ambulatory Visit (HOSPITAL_COMMUNITY)
Admission: RE | Admit: 2013-12-18 | Discharge: 2013-12-18 | Disposition: A | Discharge status: Home / Self Care | Payer: Medicare Other | Source: Ambulatory Visit | Attending: Neurology | Admitting: Neurology

## 2013-12-18 DIAGNOSIS — G35 Multiple sclerosis: Secondary | ICD-10-CM | POA: Diagnosis not present

## 2013-12-18 DIAGNOSIS — G379 Demyelinating disease of central nervous system, unspecified: Secondary | ICD-10-CM

## 2013-12-18 DIAGNOSIS — R531 Weakness: Secondary | ICD-10-CM | POA: Diagnosis present

## 2013-12-18 DIAGNOSIS — M542 Cervicalgia: Secondary | ICD-10-CM

## 2013-12-18 LAB — CREATININE, SERUM
Creatinine, Ser: 0.56 mg/dL (ref 0.50–1.10)
GFR calc Af Amer: >90 mL/min (ref >90)
GFR calc non Af Amer: >90 mL/min (ref >90)

## 2013-12-18 MED ORDER — GADOBENATE DIMEGLUMINE 529 MG/ML IV SOLN
15.0000 mL | Freq: Once | INTRAVENOUS | Status: AC
Start: 2013-12-18 — End: 2013-12-18
  Administered 2013-12-18: 15 mL via INTRAVENOUS

## 2013-12-18 NOTE — Telephone Encounter (Signed)
Discussed MRI results of the cervical spine with [name].  No evidence of demyelinating plaque in the cord to account for left arm and hand pain.  It does reveal impingement of the left C6 nerve root.  We will arrange for patient to have a left C6 selective nerve root injection.  Incidentally, it revealed a small thyroid lesion as well.  Will get [name] of thyroid to evaluate further.

## 2013-12-22 ENCOUNTER — Ambulatory Visit (INDEPENDENT_AMBULATORY_CARE_PROVIDER_SITE_OTHER): Payer: Medicare Other | Admitting: Podiatry

## 2013-12-22 ENCOUNTER — Telehealth: Payer: Self-pay | Admitting: *Deleted

## 2013-12-22 ENCOUNTER — Encounter: Payer: Self-pay | Admitting: Podiatry

## 2013-12-22 ENCOUNTER — Other Ambulatory Visit: Payer: Self-pay | Admitting: *Deleted

## 2013-12-22 VITALS — BP 168/87 | HR 85 | Ht 66.5 in | Wt 214.0 lb

## 2013-12-22 DIAGNOSIS — E041 Nontoxic single thyroid nodule: Secondary | ICD-10-CM

## 2013-12-22 DIAGNOSIS — Q828 Other specified congenital malformations of skin: Secondary | ICD-10-CM | POA: Diagnosis not present

## 2013-12-22 DIAGNOSIS — M79606 Pain in leg, unspecified: Secondary | ICD-10-CM | POA: Diagnosis not present

## 2013-12-22 DIAGNOSIS — M21969 Unspecified acquired deformity of unspecified lower leg: Secondary | ICD-10-CM | POA: Diagnosis not present

## 2013-12-22 NOTE — Progress Notes (Signed)
Subjective: 53 year old female presents complaining of painful calluses. The pad helped on 2nd toe right. Still having much pain on the toe.  Very painful to walk.   Objection:  Severe hallux valgus with bunion bilateral with painful callus on right bunion. Severe contracted lesser digits with digital corns.  Severe plantar calluses under first and 3rd MPJ area bilateral.  Neurovascular status are within normal.   Assessment: Intractable porokeratosis bilateral.  Deformed Metatarsal bilateral.  Long and contracted 2nd digit with digital corn right.  HAV with bunion bilateral.   Plan: All keratotic lesions debrided.  Available treatment options reviewed.  May benefit from Tenotomy 2nd right.

## 2013-12-22 NOTE — Telephone Encounter (Signed)
Patient has appt at [facility] for u/s thyroid  12/29/13 1:15pm

## 2013-12-22 NOTE — Patient Instructions (Signed)
Seen for painful callus. All lesions debrided and buttress pad applied to 2nd digit right. May benefit from Flexor tenotomy. Return for office tendon surgery on the 2nd digit right.

## 2013-12-25 ENCOUNTER — Telehealth: Payer: Self-pay | Admitting: *Deleted

## 2013-12-25 ENCOUNTER — Ambulatory Visit: Payer: Medicare Other | Admitting: Podiatry

## 2013-12-25 NOTE — Telephone Encounter (Signed)
Dr. Caffie Pinto, Patient had an appointment this am for foot surgery, she states her foot is still sore plus she has been on it 12 hours. She wants to cancel today's appointment but will call back an schedule an appointment later for trimming when needed.

## 2013-12-29 ENCOUNTER — Ambulatory Visit (HOSPITAL_COMMUNITY): Admission: RE | Admit: 2013-12-29 | Payer: Medicare Other | Source: Ambulatory Visit

## 2013-12-30 ENCOUNTER — Other Ambulatory Visit: Payer: Self-pay | Admitting: Physician Assistant

## 2013-12-31 ENCOUNTER — Other Ambulatory Visit: Payer: Self-pay | Admitting: Physician Assistant

## 2014-01-05 ENCOUNTER — Telehealth: Payer: Self-pay | Admitting: Neurology

## 2014-01-05 NOTE — Telephone Encounter (Signed)
Pt needs to r/s her MRI of the neck due to her showing up late for her appt today 01/05/14. Please call pt # [phone]

## 2014-01-20 ENCOUNTER — Other Ambulatory Visit: Payer: Self-pay | Admitting: *Deleted

## 2014-01-20 DIAGNOSIS — G542 Cervical root disorders, not elsewhere classified: Secondary | ICD-10-CM

## 2014-01-20 DIAGNOSIS — M542 Cervicalgia: Secondary | ICD-10-CM

## 2014-01-20 NOTE — Telephone Encounter (Signed)
Patient has cancelled her appt  For the test due to school

## 2014-01-21 ENCOUNTER — Ambulatory Visit (INDEPENDENT_AMBULATORY_CARE_PROVIDER_SITE_OTHER): Payer: Medicare Other | Admitting: Emergency Medicine

## 2014-01-21 VITALS — BP 182/100 | HR 94 | Temp 98.6°F | Resp 18 | Ht 65.5 in | Wt 226.8 lb

## 2014-01-21 DIAGNOSIS — M179 Osteoarthritis of knee, unspecified: Secondary | ICD-10-CM

## 2014-01-21 DIAGNOSIS — M1710 Unilateral primary osteoarthritis, unspecified knee: Secondary | ICD-10-CM

## 2014-01-21 DIAGNOSIS — I10 Essential (primary) hypertension: Secondary | ICD-10-CM

## 2014-01-21 DIAGNOSIS — IMO0002 Reserved for concepts with insufficient information to code with codable children: Secondary | ICD-10-CM

## 2014-01-21 MED ORDER — TRAMADOL HCL 50 MG PO TABS: 50.0000 mg | ORAL_TABLET | Freq: Three times a day (TID) | ORAL | Status: DC | PRN

## 2014-01-21 MED ORDER — LISINOPRIL 10 MG PO TABS: ORAL_TABLET | ORAL | Status: DC

## 2014-01-21 NOTE — Patient Instructions (Signed)

## 2014-01-21 NOTE — Progress Notes (Signed)
[facility] [place] [address], [place] [address] [phone]  Date:  01/21/2014   Name:  Misty [name]   DOB:  [birth year]   MRN:  [id number]  PCP:  Elizabeth Sauer    Chief Complaint: Blood Pressure Check   History of Present Illness:  Misty [name] is a 53 y.o. very pleasant female patient who presents with the following:  Says she ran out of her medication at least two weeks ago.  Now has dizziness and lightheadedness  No chest pain headache, or shortness of breath. Says home BP's are lower. Denies other complaint or health concern today.   Patient Active Problem List   Diagnosis Date Noted  . Pain in lower limb 05/22/2013  . Other hammer toe (acquired) 03/31/2013  . Porokeratosis 03/31/2013  . Pain, foot 03/31/2013  . Metatarsal deformity 03/31/2013  . VITAMIN D DEFICIENCY 04/18/2010  . FATIGUE 04/14/2010  . BACK PAIN WITH RADICULOPATHY 11/29/2008  . MEDIAL MENISCUS TEAR, LEFT 06/30/2008  . BARRETTS ESOPHAGUS 06/28/2008  . OSTEOARTHRITIS, KNEES, BILATERAL 06/28/2008  . OBESITY 05/24/2008  . HYPERTENSION, BENIGN ESSENTIAL 05/24/2008  . IRRITABLE BOWEL SYNDROME 05/24/2008  . BARIATRIC SURGERY STATUS 03/19/2006  . MULTIPLE SCLEROSIS 03/19/2002  . NEOPLASM, MALIGNANT, COLON, HX OF 03/19/1997    Past Medical History  Diagnosis Date  . Hypertension   . MS (multiple sclerosis)   . Colon cancer   . Back pain   . Umbilical hernia   . Other hammer toe (acquired) 03/31/2013    Past Surgical History  Procedure Laterality Date  . Abdominal hysterectomy    . Colon surgery    . Cesarean section    . Foot surgery      History  Substance Use Topics  . Smoking status: Current Some Day Smoker -- 0.25 packs/day for 20 years    Types: Cigarettes  . Smokeless tobacco: Never Used  . Alcohol Use: Yes     Comment: occasional    Family History  Problem Relation Age of Onset  . Stroke Mother   . Hypertension Mother   . Hyperlipidemia  Mother   . Diabetes Mother   . Diabetes Brother   . Hypertension Brother   . Hypertension Brother   . Hypertension Brother   . Hypertension Brother   . Hypertension Brother   . Alcoholism Brother     Allergies  Allergen Reactions  . Phenergan [Promethazine Hcl] Anxiety    Medication list has been reviewed and updated.  Current Outpatient Prescriptions on File Prior to Visit  Medication Sig Dispense Refill  . DULoxetine (CYMBALTA) 30 MG capsule Take 1 capsule (30 mg total) by mouth daily. 1 PO HS  # 30 with 2 refills 30 capsule 5  . esomeprazole (NEXIUM) 20 MG capsule 20 mg.    . Glatiramer Acetate (COPAXONE) 40 MG/ML SOSY Inject 40 mg into the skin as directed. 3x/week    . lisinopril (PRINIVIL,ZESTRIL) 10 MG tablet TAKE ONE TABLET BY MOUTH ONCE DAILY 30 tablet 3  . modafinil (PROVIGIL) 200 MG tablet Take 1tab every morning 30 tablet 0  . celecoxib (CELEBREX) 100 MG capsule 100 mg.    . Cholecalciferol (VITAMIN D3) 1000 UNITS CAPS Frequency:daily   Dosage:1000   UNIT  Instructions:Vitamin D (Cholecalciferol) (1000UNIT TABS, 1 Oral daily)  Note:    . traMADol (ULTRAM) 50 MG tablet Take 1 tablet (50 mg total) by mouth every 8 (eight) hours as needed. 30 tablet 0   No current facility-administered  medications on file prior to visit.    Review of Systems:  As per HPI, otherwise negative.    Physical Examination: Filed Vitals:   01/21/14 1702  BP: 182/100  Pulse: 94  Temp: 98.6 F (37 C)  Resp: 18   Filed Vitals:   01/21/14 1702  Height: 5' 5.5" (1.664 m)  Weight: 226 lb 12.8 oz (102.876 kg)   Body mass index is 37.15 kg/(m^2). Ideal Body Weight: Weight in (lb) to have BMI = 25: 152.2  GEN: WDWN, NAD, Non-toxic, A & O x 3 HEENT: Atraumatic, Normocephalic. Neck supple. No masses, No LAD. Ears and Nose: No external deformity. CV: RRR, No M/G/R. No JVD. No thrill. No extra heart sounds. PULM: CTA B, no wheezes, crackles, rhonchi. No retractions. No resp. distress. No  accessory muscle use. ABD: S, NT, ND, +BS. No rebound. No HSM. EXTR: No c/c/e NEURO Normal gait.  PSYCH: Normally interactive. Conversant. Not depressed or anxious appearing.  Calm demeanor.    Assessment and Plan: Uncontrolled hypertension Refill med Follow up 2 weeks   Signed,  Ellison Carwin, MD

## 2014-01-22 ENCOUNTER — Telehealth: Payer: Self-pay | Admitting: Neurology

## 2014-01-22 NOTE — Telephone Encounter (Signed)
She can increase Lyrica to 150mg  twice daily

## 2014-01-22 NOTE — Telephone Encounter (Signed)
Patients states she is having pain in both arms and hands and is wondering if there is something she can take for pain. She has an appt for c6 injection next week . Please advise

## 2014-01-22 NOTE — Telephone Encounter (Signed)
Pt called requesting to speak to a nurse regarding her having bad pain on both arms and hands. Please call pt # [phone]

## 2014-01-25 ENCOUNTER — Telehealth: Payer: Self-pay | Admitting: *Deleted

## 2014-01-25 NOTE — Telephone Encounter (Signed)
  She can increase Lyrica to 150mg  twice daily  if refills are needed contact office

## 2014-01-26 ENCOUNTER — Ambulatory Visit (INDEPENDENT_AMBULATORY_CARE_PROVIDER_SITE_OTHER): Payer: Medicare Other | Admitting: Family Medicine

## 2014-01-26 ENCOUNTER — Ambulatory Visit (INDEPENDENT_AMBULATORY_CARE_PROVIDER_SITE_OTHER): Payer: Medicare Other | Admitting: Podiatry

## 2014-01-26 ENCOUNTER — Emergency Department (HOSPITAL_COMMUNITY)
Admission: EM | Admit: 2014-01-26 | Discharge: 2014-01-26 | Disposition: A | Discharge status: Home / Self Care | Payer: Medicare Other | Attending: Emergency Medicine | Admitting: Emergency Medicine

## 2014-01-26 ENCOUNTER — Encounter (HOSPITAL_COMMUNITY): Payer: Self-pay | Admitting: Emergency Medicine

## 2014-01-26 ENCOUNTER — Telehealth: Payer: Self-pay

## 2014-01-26 ENCOUNTER — Emergency Department (HOSPITAL_COMMUNITY): Payer: Medicare Other

## 2014-01-26 ENCOUNTER — Encounter: Payer: Self-pay | Admitting: Podiatry

## 2014-01-26 VITALS — BP 190/97 | HR 94

## 2014-01-26 VITALS — BP 198/99 | HR 90 | Temp 98.2°F | Resp 20 | Ht 64.0 in | Wt 225.4 lb

## 2014-01-26 DIAGNOSIS — R51 Headache: Secondary | ICD-10-CM | POA: Diagnosis not present

## 2014-01-26 DIAGNOSIS — Z791 Long term (current) use of non-steroidal anti-inflammatories (NSAID): Secondary | ICD-10-CM | POA: Insufficient documentation

## 2014-01-26 DIAGNOSIS — Q828 Other specified congenital malformations of skin: Secondary | ICD-10-CM

## 2014-01-26 DIAGNOSIS — Z8669 Personal history of other diseases of the nervous system and sense organs: Secondary | ICD-10-CM | POA: Insufficient documentation

## 2014-01-26 DIAGNOSIS — Z79899 Other long term (current) drug therapy: Secondary | ICD-10-CM | POA: Diagnosis not present

## 2014-01-26 DIAGNOSIS — Z8719 Personal history of other diseases of the digestive system: Secondary | ICD-10-CM | POA: Diagnosis not present

## 2014-01-26 DIAGNOSIS — Z85038 Personal history of other malignant neoplasm of large intestine: Secondary | ICD-10-CM | POA: Diagnosis not present

## 2014-01-26 DIAGNOSIS — Z8739 Personal history of other diseases of the musculoskeletal system and connective tissue: Secondary | ICD-10-CM | POA: Insufficient documentation

## 2014-01-26 DIAGNOSIS — I10 Essential (primary) hypertension: Secondary | ICD-10-CM | POA: Diagnosis not present

## 2014-01-26 DIAGNOSIS — R079 Chest pain, unspecified: Secondary | ICD-10-CM

## 2014-01-26 DIAGNOSIS — Z720 Tobacco use: Secondary | ICD-10-CM | POA: Diagnosis not present

## 2014-01-26 DIAGNOSIS — M21969 Unspecified acquired deformity of unspecified lower leg: Secondary | ICD-10-CM

## 2014-01-26 DIAGNOSIS — R202 Paresthesia of skin: Secondary | ICD-10-CM

## 2014-01-26 DIAGNOSIS — G4489 Other headache syndrome: Secondary | ICD-10-CM

## 2014-01-26 DIAGNOSIS — R519 Headache, unspecified: Secondary | ICD-10-CM

## 2014-01-26 DIAGNOSIS — M2041 Other hammer toe(s) (acquired), right foot: Secondary | ICD-10-CM | POA: Insufficient documentation

## 2014-01-26 LAB — BASIC METABOLIC PANEL
Anion gap: 15 (ref 5–15)
BUN: 10 mg/dL (ref 6–23)
CO2: 24 mEq/L (ref 19–32)
Calcium: 9.4 mg/dL (ref 8.4–10.5)
Chloride: 102 mEq/L (ref 96–112)
Creatinine, Ser: 0.56 mg/dL (ref 0.50–1.10)
GFR calc Af Amer: >90 mL/min (ref >90)
GFR calc non Af Amer: >90 mL/min (ref >90)
Glucose, Bld: 96 mg/dL (ref 70–99)
Potassium: 3.8 mEq/L (ref 3.7–5.3)
Sodium: 141 mEq/L (ref 137–147)

## 2014-01-26 MED ORDER — METOCLOPRAMIDE HCL 5 MG/ML IJ SOLN
10.0000 mg | Freq: Once | INTRAMUSCULAR | Status: AC
  Administered 2014-01-26: 10 mg via INTRAVENOUS
  Filled 2014-01-26: qty 2

## 2014-01-26 MED ORDER — ASPIRIN 81 MG PO CHEW: 243.0000 mg | CHEWABLE_TABLET | Freq: Once | ORAL | Status: DC

## 2014-01-26 MED ORDER — HYDROCHLOROTHIAZIDE 25 MG PO TABS: 12.5000 mg | ORAL_TABLET | Freq: Every day | ORAL | Status: DC

## 2014-01-26 MED ORDER — DIPHENHYDRAMINE HCL 50 MG/ML IJ SOLN
25.0000 mg | Freq: Once | INTRAMUSCULAR | Status: AC
  Administered 2014-01-26: 25 mg via INTRAVENOUS
  Filled 2014-01-26: qty 1

## 2014-01-26 MED ORDER — DEXAMETHASONE SODIUM PHOSPHATE 10 MG/ML IJ SOLN: 10.0000 mg | Freq: Once | INTRAMUSCULAR | Status: DC

## 2014-01-26 MED ORDER — DEXAMETHASONE SODIUM PHOSPHATE 10 MG/ML IJ SOLN
10.0000 mg | Freq: Once | INTRAMUSCULAR | Status: AC
  Administered 2014-01-26: 10 mg via INTRAVENOUS
  Filled 2014-01-26: qty 1

## 2014-01-26 NOTE — Telephone Encounter (Signed)
Pt states she is very concerned with her blood pressure,it is not leveling out,it is at 188/102,please advise   Best phone for pt is [phone]   Pharmacy [facility]

## 2014-01-26 NOTE — ED Notes (Signed)
Pt states "I have been falling a lot since last year, when I walk up stairs I get dizzy and it gets hard to breathe and I also have stinging feeling to feet and arms that comes and goes, none right now." Headache 8/10.

## 2014-01-26 NOTE — Patient Instructions (Signed)
Seen for painful toe 2nd right. All corns and calluses debrided. May benefit from Flexor tenotomy on 2nd toe right. Return as needed.

## 2014-01-26 NOTE — ED Provider Notes (Signed)
CSN: [id number]     Arrival date & time 01/26/14  1931 History   First MD Initiated Contact with Patient 01/26/14 1941     Chief Complaint  Patient presents with  . Hypertension     (Consider location/radiation/quality/duration/timing/severity/associated sxs/prior Treatment) Patient is a 53 y.o. female presenting with headaches. The history is provided by the patient. No language interpreter was used.  Headache Pain location:  Occipital Quality: pressure. Radiates to:  Does not radiate Severity currently:  8/10 Severity at highest:  10/10 Onset quality:  Gradual Duration:  3 days Timing:  Constant Progression:  Worsening Chronicity:  Recurrent Similar to prior headaches: yes   Context: activity   Relieved by:  Nothing Worsened by:  Activity Ineffective treatments:  None tried Associated symptoms: weakness (generalized)   Associated symptoms: no abdominal pain, no blurred vision, no congestion, no cough, no diarrhea, no fever, no focal weakness, no nausea, no photophobia, no seizures, no sore throat and no vomiting   Risk factors: no family hx of SAH   Risk factors comment:  HTN, pseudotumor   Past Medical History  Diagnosis Date  . Hypertension   . MS (multiple sclerosis)   . Colon cancer   . Back pain   . Umbilical hernia   . Other hammer toe (acquired) 03/31/2013   Past Surgical History  Procedure Laterality Date  . Abdominal hysterectomy    . Colon surgery    . Cesarean section    . Foot surgery     Family History  Problem Relation Age of Onset  . Stroke Mother   . Hypertension Mother   . Hyperlipidemia Mother   . Diabetes Mother   . Diabetes Brother   . Hypertension Brother   . Hypertension Brother   . Hypertension Brother   . Hypertension Brother   . Hypertension Brother   . Alcoholism Brother    History  Substance Use Topics  . Smoking status: Current Some Day Smoker -- 0.25 packs/day for 20 years    Types: Cigarettes  . Smokeless tobacco:  Never Used  . Alcohol Use: 0.0 oz/week    0 Not specified per week     Comment: occasional   OB History    No data available     Review of Systems  Constitutional: Negative for fever.  HENT: Negative for congestion, rhinorrhea and sore throat.   Eyes: Negative for blurred vision and photophobia.  Respiratory: Negative for cough and shortness of breath.   Cardiovascular: Negative for chest pain.  Gastrointestinal: Negative for nausea, vomiting, abdominal pain and diarrhea.  Genitourinary: Negative for dysuria and hematuria.  Skin: Negative for rash.  Neurological: Positive for headaches. Negative for focal weakness, seizures, syncope and light-headedness.  All other systems reviewed and are negative.     Allergies  Phenergan  Home Medications   Prior to Admission medications   Medication Sig Start Date End Date Taking? Authorizing Provider  celecoxib (CELEBREX) 100 MG capsule 100 mg.    Historical Provider, MD  Cholecalciferol (VITAMIN D3) 1000 UNITS CAPS Frequency:daily   Dosage:1000   UNIT  Instructions:Vitamin D (Cholecalciferol) (1000UNIT TABS, 1 Oral daily)  Note:    Historical Provider, MD  DULoxetine (CYMBALTA) 30 MG capsule Take 1 capsule (30 mg total) by mouth daily. 1 PO HS  # 30 with 2 refills 11/13/13   Mancel Bale, PA-C  esomeprazole (NEXIUM) 20 MG capsule 20 mg.    Historical Provider, MD  Glatiramer Acetate (COPAXONE) 40 MG/ML SOSY Inject 40  mg into the skin as directed. 3x/week    Historical Provider, MD  lisinopril (PRINIVIL,ZESTRIL) 10 MG tablet TAKE ONE TABLET BY MOUTH ONCE DAILY 01/21/14   Roselee Culver, MD  modafinil (PROVIGIL) 200 MG tablet Take 1tab every morning 12/01/13   Dudley Major, DO  traMADol (ULTRAM) 50 MG tablet Take 1 tablet (50 mg total) by mouth every 8 (eight) hours as needed. 01/21/14   Roselee Culver, MD   Ht 5\' 6"  (1.676 m)  Wt 222 lb (100.699 kg)  BMI 35.85 kg/m2 Physical Exam  Constitutional: She is oriented to person,  place, and time. She appears well-developed and well-nourished.  HENT:  Head: Normocephalic and atraumatic.  Right Ear: External ear normal.  Left Ear: External ear normal.  Eyes: EOM are normal.  Neck: Normal range of motion. Neck supple.  Cardiovascular: Normal rate, regular rhythm and intact distal pulses.  Exam reveals no gallop and no friction rub.   No murmur heard. Pulmonary/Chest: Effort normal and breath sounds normal. No respiratory distress. She has no wheezes. She has no rales. She exhibits no tenderness.  Abdominal: Soft. Bowel sounds are normal. She exhibits no distension. There is no tenderness. There is no rebound.  Musculoskeletal: Normal range of motion. She exhibits no edema or tenderness.  Lymphadenopathy:    She has no cervical adenopathy.  Neurological: She is alert and oriented to person, place, and time.  Neurologic exam: Fundoscopic exam: no papilledema, no hemorrhages, CN I-XII: grossly intact, Sensation: normal in upper and lower extremities, Strength 5/5 in both upper and lower extremities, Coordination intact. Gait normal.  Skin: Skin is warm. No rash noted.  Psychiatric: She has a normal mood and affect. Her behavior is normal.  Nursing note and vitals reviewed.   ED Course  Procedures (including critical care time) Labs Review Labs Reviewed  BASIC METABOLIC PANEL    Imaging Review Ct Head Wo Contrast  01/26/2014   CLINICAL DATA:  Dizziness, headache for 3 days  EXAM: CT HEAD WITHOUT CONTRAST  TECHNIQUE: Contiguous axial images were obtained from the base of the skull through the vertex without intravenous contrast.  COMPARISON:  Brain MRI 08/20/2013  FINDINGS: No skull fracture is noted. Paranasal sinuses and mastoid air cells are unremarkable. No intracranial hemorrhage, mass effect or midline shift. Stable mild cerebral atrophy. Stable subcortical chronic white matter disease. No acute cortical infarction. No mass lesion is noted on this unenhanced  scan.  IMPRESSION: No acute intracranial abnormality. Stable atrophy and chronic white matter disease.   Electronically Signed   By: Lahoma Crocker M.D.   On: 01/26/2014 20:45     EKG Interpretation   Date/Time:  Tuesday January 26 2014 19:38:09 EST Ventricular Rate:  64 PR Interval:  150 QRS Duration: 89 QT Interval:  438 QTC Calculation: 452 R Axis:   37 Text Interpretation:  Sinus rhythm Sinus rhythm Normal ECG Confirmed by  Carmin Muskrat  MD ([id number]) on 01/26/2014 8:11:30 PM      MDM   Final diagnoses:  Headache  Essential hypertension  Acute nonintractable headache, unspecified headache type    7:42 PM Pt is a 53 y.o. female with pertinent PMHX of HTN, MS, pseudotumor who presents to the ED with hypertension, headache. Patient endorses generalized weakness for the past few days. No focal deficits. No slurred speech, word finding. Was sent from PCP for HTN 170/90s. Was off lisinopril due to running out for 8 days. Was able to refill lisinopril recently. No visual disturbance no  hearing loss. Also endorsing a headache: occipital and behind eyes similar to previous pseudotumor headaches. No thunderclap. No fever or red flags. No trauma. Review of records: patient sent from PCP for concerns of stroke and chest pain and shortness of breath. When asked about shortness of breath and chest pain, patient denies chest pain or shortness of breath, and feels more generalized weakness when walking. Denies unilateral weakness. No chest pain currently. No family history of premature CAD.   No risk factors for [facility] such as family history of aneurysm, connective tissue disease  On exam: non focal neurologic exam. Fundoscopic exam without evidence of hemorrhages. No evidence of end organ damage on exam. Plan to check BMP to rule out possible end organ damage. Plan for CT head wo contrast and headache cocktail. Patient denies any chest pain currently or recently. Patient mostly concerned about her blood  pressure and headache.  EKG personally reviewed by myself showed NSR Rate of 64, PR 18ms, QRS 38ms QT/QTC 438/470ms, normal axis, without evidence of new ischemia. Comparison showed similar, indication: HTN   BMP: no evidence of elevated Cr to suggest end organ damage. Will hold off on acutely lower BP with IV antihypertensives  CT head wo contrast no intracranial abnormalities. Headache improved after headache cocktail  10:30 PM: ambulated without drop in sats no chest pain or shortness of breath. Blood pressure 761Y systolic per nurse. On recheck 180s. Discussed at length with patient. Will hold off on acutely lowering in the ED. Review of BMP: CR amenable to starting low dose HCTZ. Plan for discharge home with script for HCTZ. Patient to continue lisinopril. Patient instructed to make log of blood pressure and follow up with PCP in 1 week to recheck Cr and adjust BP medications. Patient counseled on adherence to BP medications and smoking cessation.  Neurologic exam unchanged. Plan for discharge with strict return precautions and close follow up with PCP  10:44 PM: I have discussed the diagnosis/risks/treatment options with the patient and family and believe the pt to be eligible for discharge home to follow-up with PCP in 1 week for recheck Cr. We also discussed returning to the ED immediately if new or worsening sx occur. We discussed the sx which are most concerning (e.g., worsening symptoms) that necessitate immediate return. Any new prescriptions provided to the patient are listed below.   Discharge Medication List as of 01/26/2014 10:47 PM    START taking these medications   Details  hydrochlorothiazide (HYDRODIURIL) 25 MG tablet Take 0.5 tablets (12.5 mg total) by mouth daily., Starting 01/26/2014, Until Discontinued, Print        The patient appears reasonably screened and/or stabilized for discharge and I doubt any other medical condition or other [facility] requiring further screening,  evaluation or treatment in the ED at this time prior to discharge . Pt in agreement with discharge plan. Return precautions given. Pt discharged VSS   Labs, EKG and imaging reviewed by myself and considered in medical decision making if ordered.  Imaging interpreted by radiology. Pt was discussed with my attending, Dr. Eldridge Abrahams, MD 01/27/14 [place], MD 01/27/14 1810

## 2014-01-26 NOTE — ED Notes (Signed)
Pt ambulated without difficulty, maintained O2 sats 97% or higher. Now pt reports increased in headache BP noted to have increased.

## 2014-01-26 NOTE — Telephone Encounter (Signed)
Spoke to pt. I advised her she needs to come into clinic to have her BP re-evaluated or go to ED. She is having severe headaches and overall does not feel well. I told her she needs to be seen today.  Pt wanted to just double BP medication dose on her own, but I advised her not to mess with dosing on her own.

## 2014-01-26 NOTE — Progress Notes (Signed)
Subjective: 53 year old female presents complaining of painful toe 2nd right and calluses. The toe is very painful to walk.   Objection:  Severe hallux valgus with bunion bilateral with painful callus on right bunion. Severe contracted lesser digits with digital corns on 2nd right.  Severe plantar calluses under first and 3rd MPJ area bilateral.  Neurovascular status are within normal.   Assessment: Intractable porokeratosis bilateral.  Deformed Metatarsal bilateral.  Long and contracted 2nd digit with digital corn right.  HAV with bunion bilateral.   Plan: All keratotic lesions debrided.  Available treatment options reviewed. May benefit from Flexor tenotomy 2nd right.  May benefit from Tenotomy 2nd right.

## 2014-01-26 NOTE — Progress Notes (Signed)
Chief Complaint:  Chief Complaint  Patient presents with  . Hypertension  . Chest Pain  . Headache    Pressure in Head    HPI: Misty [name] is a 53 y.o. female who is here for a 2 day hx of midsternal chest pain, associated with  HA in back of her ears and back of her head, with worsening left sided paresthesia starting yesterday and worsening today Her BP has been poorly controlled, and was restarted on lisinopril 10 mg after being off meds and also left sided numbness which she chronically has but is worse in the last 2 days,started yesterday after she started climibing up some stairs. She had chest pain. She has numbness and tingling all day long on the left side worse  She was clammy and felt like she was going to past out She is having a lot of stress, she has been having blood pressure  Issues She has hyperlipidemia, she is a smoker She has MS and has chronic numbness and tingling but she has had more of the left side SHe has been clammy  And has had some GI upset but no diarrhea, blood in stool She is also stressed at school, culinary  Only new medicine is increase dose of lyrica No nausea, vomiting, palpitations, slurred speech, drooped facial features  BP Readings from Last 3 Encounters:  01/26/14 198/99  01/26/14 190/97  01/21/14 182/100   Lab Results  Component Value Date   CHOL 278* 03/17/2013   CHOL 230* 04/14/2010   Lab Results  Component Value Date   HDL 121 03/17/2013   HDL 105 04/14/2010   Lab Results  Component Value Date   LDLCALC 136* 03/17/2013   LDLCALC 104* 04/14/2010   Lab Results  Component Value Date   TRIG 106 03/17/2013   TRIG 104 04/14/2010   Lab Results  Component Value Date   CHOLHDL 2.3 03/17/2013   CHOLHDL 2.2 Ratio 04/14/2010   No results found for: LDLDIRECT    Past Medical History  Diagnosis Date  . Hypertension   . MS (multiple sclerosis)   . Colon cancer   . Back pain   . Umbilical hernia   . Other  hammer toe (acquired) 03/31/2013   Past Surgical History  Procedure Laterality Date  . Abdominal hysterectomy    . Colon surgery    . Cesarean section    . Foot surgery     History   Social History  . Marital Status: Single    Spouse Name: N/A    Number of Children: N/A  . Years of Education: N/A   Social History Main Topics  . Smoking status: Current Some Day Smoker -- 0.25 packs/day for 20 years    Types: Cigarettes  . Smokeless tobacco: Never Used  . Alcohol Use: 0.0 oz/week    0 Not specified per week     Comment: occasional  . Drug Use: No  . Sexual Activity:    Partners: Male   Other Topics Concern  . None   Social History Narrative   Family History  Problem Relation Age of Onset  . Stroke Mother   . Hypertension Mother   . Hyperlipidemia Mother   . Diabetes Mother   . Diabetes Brother   . Hypertension Brother   . Hypertension Brother   . Hypertension Brother   . Hypertension Brother   . Hypertension Brother   . Alcoholism Brother    Allergies  Allergen Reactions  .  Phenergan [Promethazine Hcl] Anxiety   Prior to Admission medications   Medication Sig Start Date End Date Taking? Authorizing Provider  celecoxib (CELEBREX) 100 MG capsule 100 mg.   Yes Historical Provider, MD  Cholecalciferol (VITAMIN D3) 1000 UNITS CAPS Frequency:daily   Dosage:1000   UNIT  Instructions:Vitamin D (Cholecalciferol) (1000UNIT TABS, 1 Oral daily)  Note:   Yes Historical Provider, MD  esomeprazole (NEXIUM) 20 MG capsule 20 mg.   Yes Historical Provider, MD  Glatiramer Acetate (COPAXONE) 40 MG/ML SOSY Inject 40 mg into the skin as directed. 3x/week   Yes Historical Provider, MD  lisinopril (PRINIVIL,ZESTRIL) 10 MG tablet TAKE ONE TABLET BY MOUTH ONCE DAILY 01/21/14  Yes Roselee Culver, MD  modafinil (PROVIGIL) 200 MG tablet Take 1tab every morning 12/01/13  Yes Adam Melvern Sample, DO  traMADol (ULTRAM) 50 MG tablet Take 1 tablet (50 mg total) by mouth every 8 (eight) hours as  needed. 01/21/14  Yes Roselee Culver, MD  DULoxetine (CYMBALTA) 30 MG capsule Take 1 capsule (30 mg total) by mouth daily. 1 PO HS  # 30 with 2 refills 11/13/13   Mancel Bale, PA-C     ROS: The patient denies fevers, chills, night sweats, unintentional weight loss, palpitations, wheezing, dyspnea on exertion, nausea, vomiting, abdominal pain, dysuria, hematuria, melena, + numbness, weakness, or tingling, chest pain   All other systems have been reviewed and were otherwise negative with the exception of those mentioned in the HPI and as above.    PHYSICAL EXAM: Filed Vitals:   01/26/14 1744  BP: 198/99  Pulse: 90  Temp: 98.2 F (36.8 C)  Resp: 20   Filed Vitals:   01/26/14 1744  Height: 5\' 4"  (1.626 m)  Weight: 225 lb 6 oz (102.229 kg)   Body mass index is 38.67 kg/(m^2).  General: Alert, no acute distress HEENT:  Normocephalic, atraumatic, oropharynx patent. EOMI, PERRLA Cardiovascular:  Regular rate and rhythm, no rubs murmurs or gallops.  No Carotid bruits, radial pulse intact. No pedal edema.  Respiratory: Clear to auscultation bilaterally.  No wheezes, rales, or rhonchi.  No cyanosis, no use of accessory musculature GI: No organomegaly, abdomen is soft and non-tender, positive bowel sounds.  No masses. Skin: No rashes Neurologic: Facial musculature symmetric. +/- left sided weakness, eally unequivical Psychiatric: Patient is appropriate throughout our interaction. Lymphatic: No cervical lymphadenopathy Musculoskeletal: Gait intact.   LABS: Results for orders placed or performed during the hospital encounter of 12/18/13  Creatinine, serum  Result Value Ref Range   Creatinine, Ser 0.56 0.50 - 1.10 mg/dL   GFR calc non Af Amer >90 >90 mL/min   GFR calc Af Amer >90 >90 mL/min     EKG/XRAY:   Primary read interpreted by Dr. Marin Comment at [facility].   ASSESSMENT/PLAN: Encounter Diagnoses  Name Primary?  . Chest pain, unspecified chest pain type Yes  . Paresthesia   .  Essential hypertension   . Other headache syndrome    53 y/o female with hx of MS with chronic n/w/t but normally diffuse bialterally, HTN, Hyperlipidemia,GERD,  tobacco use who presents with sharp midsternal CP after walkign upstair starting yesterday and then also today has worsening symptoms of left sided numbness and tingling, occipital HA and also uncontrolled HTN Rule out CVA and also CP work up  [employer] [facility] ER for further evaluation by EMS,  ASA 325 mg given Spoke with daughter and patient and everyone agreed   Gross sideeffects, risk and benefits, and alternatives of  medications d/w patient. Patient is aware that all medications have potential sideeffects and we are unable to predict every sideeffect or drug-drug interaction that may occur.  Devion Chriscoe, [place], DO 01/26/2014 7:13 PM

## 2014-01-26 NOTE — Discharge Instructions (Signed)
1. Take log of your blood pressure 2. Take HCTZ 1/2 tablet daily 3. See you PCP in 1 week 4. Follow up with your neurologist 5. Come back if worsening symptoms Hypertension Hypertension, commonly called high blood pressure, is when the force of blood pumping through your arteries is too strong. Your arteries are the blood vessels that carry blood from your heart throughout your body. A blood pressure reading consists of a higher number over a lower number, such as 110/72. The higher number (systolic) is the pressure inside your arteries when your heart pumps. The lower number (diastolic) is the pressure inside your arteries when your heart relaxes. Ideally you want your blood pressure below 120/80. Hypertension forces your heart to work harder to pump blood. Your arteries may become narrow or stiff. Having hypertension puts you at risk for heart disease, stroke, and other problems.  RISK FACTORS Some risk factors for high blood pressure are controllable. Others are not.  Risk factors you cannot control include:   Race. You may be at higher risk if you are African American.  Age. Risk increases with age.  Gender. Men are at higher risk than women before age 52 years. After age 70, women are at higher risk than men. Risk factors you can control include:  Not getting enough exercise or physical activity.  Being overweight.  Getting too much fat, sugar, calories, or salt in your diet.  Drinking too much alcohol. SIGNS AND SYMPTOMS Hypertension does not usually cause signs or symptoms. Extremely high blood pressure (hypertensive crisis) may cause headache, anxiety, shortness of breath, and nosebleed. DIAGNOSIS  To check if you have hypertension, your health care provider will measure your blood pressure while you are seated, with your arm held at the level of your heart. It should be measured at least twice using the same arm. Certain conditions can cause a difference in blood pressure  between your right and left arms. A blood pressure reading that is higher than normal on one occasion does not mean that you need treatment. If one blood pressure reading is high, ask your health care provider about having it checked again. TREATMENT  Treating high blood pressure includes making lifestyle changes and possibly taking medicine. Living a healthy lifestyle can help lower high blood pressure. You may need to change some of your habits. Lifestyle changes may include:  Following the DASH diet. This diet is high in fruits, vegetables, and whole grains. It is low in salt, red meat, and added sugars.  Getting at least 2 hours of brisk physical activity every week.  Losing weight if necessary.  Not smoking.  Limiting alcoholic beverages.  Learning ways to reduce stress. If lifestyle changes are not enough to get your blood pressure under control, your health care provider may prescribe medicine. You may need to take more than one. Work closely with your health care provider to understand the risks and benefits. HOME CARE INSTRUCTIONS  Have your blood pressure rechecked as directed by your health care provider.   Take medicines only as directed by your health care provider. Follow the directions carefully. Blood pressure medicines must be taken as prescribed. The medicine does not work as well when you skip doses. Skipping doses also puts you at risk for problems.   Do not smoke.   Monitor your blood pressure at home as directed by your health care provider. SEEK MEDICAL CARE IF:   You think you are having a reaction to medicines taken.  You have  recurrent headaches or feel dizzy.  You have swelling in your ankles.  You have trouble with your vision. SEEK IMMEDIATE MEDICAL CARE IF:  You develop a severe headache or confusion.  You have unusual weakness, numbness, or feel faint.  You have severe chest or abdominal pain.  You vomit repeatedly.  You have trouble  breathing. MAKE SURE YOU:   Understand these instructions.  Will watch your condition.  Will get help right away if you are not doing well or get worse. Document Released: 03/05/2005 Document Revised: 07/20/2013 Document Reviewed: 12/26/2012 [facility] Patient Information 2015 [place], [place]. This information is not intended to replace advice given to you by your health care provider. Make sure you discuss any questions you have with your health care provider.

## 2014-01-26 NOTE — ED Notes (Signed)
Per EMS- PCP called ems reported left sided weakness, headache and hypertension. Went in to PCP for CT of head. EMS reports no weakeness. 20 G PIV to L AC in place by PCP.  170s/90s BP, pt is prescribed lisinopril but has not taken it for 8 days.

## 2014-01-27 ENCOUNTER — Telehealth: Payer: Self-pay

## 2014-01-27 MED ORDER — LISINOPRIL 20 MG PO TABS: 20.0000 mg | ORAL_TABLET | Freq: Every day | ORAL | Status: DC

## 2014-01-27 NOTE — Telephone Encounter (Signed)
Pt states she was sent by EMS yesterday and had a of tests done, however her BP is still high every time she move around, her pressure goes up. The hospital ruled out anything that could be wrong so she Doesn't know what to do. BP was 160/91 and then 182/114. Please call [phone]

## 2014-01-27 NOTE — Telephone Encounter (Signed)
Will go ahead and increase her BP meds. She will get Lisinopril 20 mg to take daily , if still greater than 150/90 then can increase frequency to BID. She should take the HCTZ 10 mg. I advise her to call me back so that we are not droppingher BP to quickly.

## 2014-01-27 NOTE — Telephone Encounter (Signed)
Pt received a prescription for 10mg  HCTZ in the ED. Pt has yet to take medication. She was not able to get script filled. She will get script picked up today. Advised pt to RTC to talk to Dr. Marin Comment or another provider about her BP/follow up ED. She states she will be in as soon as she can.

## 2014-01-28 ENCOUNTER — Telehealth: Payer: Self-pay

## 2014-01-28 ENCOUNTER — Other Ambulatory Visit: Payer: Self-pay | Admitting: Neurosurgery

## 2014-01-28 NOTE — Telephone Encounter (Signed)
Pt of [place] would like to know if she can have something called in for her yeast infection, also she states that she was sent to the hospital by [facility], and would like her to know that her bp is under control

## 2014-01-28 NOTE — Telephone Encounter (Signed)
LM for pt to RTC- we have not prescribed abx nor evaluated pt for any vaginal irritation.

## 2014-02-02 ENCOUNTER — Ambulatory Visit: Payer: Medicare Other | Admitting: Physical Therapy

## 2014-02-23 ENCOUNTER — Encounter (HOSPITAL_COMMUNITY)
Admission: RE | Admit: 2014-02-23 | Discharge: 2014-02-23 | Disposition: A | Discharge status: Home / Self Care | Payer: Medicare Other | Source: Ambulatory Visit | Attending: Neurosurgery | Admitting: Neurosurgery

## 2014-02-23 ENCOUNTER — Encounter (HOSPITAL_COMMUNITY): Payer: Self-pay

## 2014-02-23 HISTORY — DX: Gastro-esophageal reflux disease without esophagitis: K21.9

## 2014-02-23 HISTORY — DX: Pneumonia, unspecified organism: J18.9

## 2014-02-23 HISTORY — DX: Unspecified osteoarthritis, unspecified site: M19.90

## 2014-02-23 HISTORY — DX: Insomnia, unspecified: G47.00

## 2014-02-23 HISTORY — DX: Diarrhea, unspecified: R19.7

## 2014-02-23 LAB — BASIC METABOLIC PANEL
Anion gap: 14 (ref 5–15)
BUN: 9 mg/dL (ref 6–23)
CO2: 24 mEq/L (ref 19–32)
Calcium: 9.1 mg/dL (ref 8.4–10.5)
Chloride: 105 mEq/L (ref 96–112)
Creatinine, Ser: 0.55 mg/dL (ref 0.50–1.10)
GFR calc Af Amer: >90 mL/min (ref >90)
GFR calc non Af Amer: >90 mL/min (ref >90)
Glucose, Bld: 79 mg/dL (ref 70–99)
Potassium: 3.9 mEq/L (ref 3.7–5.3)
Sodium: 143 mEq/L (ref 137–147)

## 2014-02-23 LAB — SURGICAL PCR SCREEN
MRSA, PCR: NEGATIVE
Staphylococcus aureus: NEGATIVE

## 2014-02-23 LAB — CBC WITH DIFFERENTIAL/PLATELET
Basophils Absolute: 0 10*3/uL (ref 0.0–0.1)
Basophils Relative: 0 % (ref 0–1)
Eosinophils Absolute: 0.1 10*3/uL (ref 0.0–0.7)
Eosinophils Relative: 2 % (ref 0–5)
HCT: 37 % (ref 36.0–46.0)
Hemoglobin: 12 g/dL (ref 12.0–15.0)
Lymphocytes Relative: 23 % (ref 12–46)
Lymphs Abs: 1.6 10*3/uL (ref 0.7–4.0)
MCH: 34.6 pg — ABNORMAL HIGH (ref 26.0–34.0)
MCHC: 32.4 g/dL (ref 30.0–36.0)
MCV: 106.6 fL — ABNORMAL HIGH (ref 78.0–100.0)
Monocytes Absolute: 0.5 10*3/uL (ref 0.1–1.0)
Monocytes Relative: 7 % (ref 3–12)
Neutro Abs: 4.6 10*3/uL (ref 1.7–7.7)
Neutrophils Relative %: 68 % (ref 43–77)
Platelets: 213 10*3/uL (ref 150–400)
RBC: 3.47 MIL/uL — ABNORMAL LOW (ref 3.87–5.11)
RDW: 13.3 % (ref 11.5–15.5)
WBC: 6.9 10*3/uL (ref 4.0–10.5)

## 2014-02-23 NOTE — Pre-Procedure Instructions (Signed)
TRINIDAD INGLE  02/23/2014   Your procedure is scheduled on:  Monday March 01, 2014 at 7:45 AM.  Report to [facility] Admitting at 5:30 AM.  Call this number if you have problems the morning of surgery: [phone]  For any other questions call [phone] M-F from 8:00-4:00    Remember:   Do not eat food or drink liquids after midnight.   Take these medicines the morning of surgery with A SIP OF WATER: Acetaminophen (Tylenol) if needed, Pregabalin (Lyrica), and Tramadol (Ultram) if needed    Do not wear jewelry, make-up or nail polish.  Do not wear lotions, powders, or perfumes.   Do not shave 48 hours prior to surgery.   Do not bring valuables to the hospital.  [facility] is not responsible for any belongings or valuables.               Contacts, dentures or bridgework may not be worn into surgery.  Leave suitcase in the car. After surgery it may be brought to your room.  For patients admitted to the hospital, discharge time is determined by your treatment team.               Patients discharged the day of surgery will not be allowed to drive home.  Name and phone number of your driver:   Special Instructions: Shower using CHG soap the night before and the morning of your surgery   Please read over the following fact sheets that you were given: Pain Booklet, Coughing and Deep Breathing, MRSA Information and Surgical Site Infection Prevention

## 2014-02-23 NOTE — Progress Notes (Signed)
Patient's health history was done over the phone as patient stated she "forgot" about 1200 scheduled PAT appointment. Therefore lab work was obtained and consent form signed. Patient informed Nurse that she has another appointment at 1400. PCP is Windell Hummingbird, NP. Patient denied having any chest or pulmonary issues.

## 2014-02-28 MED ORDER — DEXAMETHASONE SODIUM PHOSPHATE 10 MG/ML IJ SOLN
10.0000 mg | INTRAMUSCULAR | Status: DC
  Filled 2014-02-28: qty 1

## 2014-02-28 MED ORDER — CEFAZOLIN SODIUM-DEXTROSE 2-3 GM-% IV SOLR
2.0000 g | INTRAVENOUS | Status: AC
  Administered 2014-03-01: 2 g via INTRAVENOUS
  Filled 2014-02-28: qty 50

## 2014-02-28 NOTE — H&P (Signed)
Misty [name] is an 53 y.o. female.   Chief Complaint: neck Pain HPI: 53 yo with neck pain and left UE N/Pand weakness.  MRI with Left C56 Hnp with left c6 root impingment. Patient presents for ACDF  Past Medical History  Diagnosis Date  . Hypertension   . MS (multiple sclerosis)   . Colon cancer   . Back pain   . Umbilical hernia   . Other hammer toe (acquired) 03/31/2013  . Pneumonia     hx of  . GERD (gastroesophageal reflux disease)   . Diarrhea   . Arthritis   . Insomnia     Past Surgical History  Procedure Laterality Date  . Colon surgery    . Cesarean section    . Foot surgery Right   . Abdominal hysterectomy    . Colonoscopy    . Gastric bypass      Family History  Problem Relation Age of Onset  . Stroke Mother   . Hypertension Mother   . Hyperlipidemia Mother   . Diabetes Mother   . Diabetes Brother   . Hypertension Brother   . Hypertension Brother   . Hypertension Brother   . Hypertension Brother   . Hypertension Brother   . Alcoholism Brother    Social History:  reports that she has been smoking Cigarettes.  She has a 5 pack-year smoking history. She has never used smokeless tobacco. She reports that she drinks alcohol. She reports that she does not use illicit drugs.  Allergies:  Allergies  Allergen Reactions  . Phenergan [Promethazine Hcl] Anxiety    No prescriptions prior to admission    No results found for this or any previous visit (from the past 48 hour(s)). No results found.  Review of Systems  Constitutional: Negative.   HENT: Negative.   Eyes: Negative.   Respiratory: Negative.   Cardiovascular: Negative.   Gastrointestinal: Negative.   Genitourinary: Negative.   Musculoskeletal: Negative.   Skin: Negative.   Neurological: Negative.   Endo/Heme/Allergies: Negative.   Psychiatric/Behavioral: Negative.     There were no vitals taken for this visit. Physical Exam  Constitutional: She is oriented to person, place, and  time. She appears well-developed and well-nourished.  HENT:  Head: Normocephalic and atraumatic.  Right Ear: External ear normal.  Left Ear: External ear normal.  Nose: Nose normal.  Mouth/Throat: Oropharynx is clear and moist. No oropharyngeal exudate.  Eyes: Conjunctivae and EOM are normal. Pupils are equal, round, and reactive to light. Right eye exhibits no discharge. Left eye exhibits no discharge.  Neck: Normal range of motion. Neck supple. No tracheal deviation present. No thyromegaly present.  Cardiovascular: Normal rate, regular rhythm, normal heart sounds and intact distal pulses.  Exam reveals no friction rub.   No murmur heard. Respiratory: Effort normal and breath sounds normal. No respiratory distress. She has no wheezes.  GI: Soft. Bowel sounds are normal. She exhibits no distension. There is no tenderness.  Musculoskeletal: Normal range of motion. She exhibits no edema or tenderness.  Neurological: She is alert and oriented to person, place, and time. She has normal reflexes. She displays normal reflexes. No cranial nerve deficit. She exhibits normal muscle tone. Coordination normal.  Skin: Skin is warm and dry. No rash noted. No erythema. No pallor.  Psychiatric: She has a normal mood and affect. Her behavior is normal. Judgment and thought content normal.     Assessment/Plan C5/6 HNP with radiculopathy. Plan C5/6 ACDF.  Risks and benefits explained. Patient  wishes to proceed.  Bradely Rudin A 02/28/2014, 10:00 PM

## 2014-03-01 ENCOUNTER — Ambulatory Visit (HOSPITAL_COMMUNITY): Payer: Medicare Other | Admitting: Anesthesiology

## 2014-03-01 ENCOUNTER — Inpatient Hospital Stay (HOSPITAL_COMMUNITY)
Admission: RE | Admit: 2014-03-01 | Discharge: 2014-03-01 | DRG: 473 | Disposition: A | Discharge status: Home / Self Care | Payer: Medicare Other | Source: Ambulatory Visit | Attending: Neurosurgery | Admitting: Neurosurgery

## 2014-03-01 ENCOUNTER — Ambulatory Visit (HOSPITAL_COMMUNITY): Payer: Medicare Other

## 2014-03-01 ENCOUNTER — Encounter (HOSPITAL_COMMUNITY)
Admission: RE | Disposition: A | Discharge status: Home / Self Care | Payer: Self-pay | Source: Ambulatory Visit | Attending: Neurosurgery

## 2014-03-01 ENCOUNTER — Encounter (HOSPITAL_COMMUNITY): Payer: Self-pay | Admitting: *Deleted

## 2014-03-01 DIAGNOSIS — G35 Multiple sclerosis: Secondary | ICD-10-CM | POA: Diagnosis not present

## 2014-03-01 DIAGNOSIS — I10 Essential (primary) hypertension: Secondary | ICD-10-CM | POA: Diagnosis present

## 2014-03-01 DIAGNOSIS — Z7982 Long term (current) use of aspirin: Secondary | ICD-10-CM

## 2014-03-01 DIAGNOSIS — K219 Gastro-esophageal reflux disease without esophagitis: Secondary | ICD-10-CM | POA: Diagnosis present

## 2014-03-01 DIAGNOSIS — M4722 Other spondylosis with radiculopathy, cervical region: Secondary | ICD-10-CM | POA: Diagnosis present

## 2014-03-01 DIAGNOSIS — G4700 Insomnia, unspecified: Secondary | ICD-10-CM | POA: Diagnosis present

## 2014-03-01 DIAGNOSIS — M4802 Spinal stenosis, cervical region: Secondary | ICD-10-CM

## 2014-03-01 DIAGNOSIS — K429 Umbilical hernia without obstruction or gangrene: Secondary | ICD-10-CM | POA: Diagnosis not present

## 2014-03-01 DIAGNOSIS — Z419 Encounter for procedure for purposes other than remedying health state, unspecified: Secondary | ICD-10-CM

## 2014-03-01 HISTORY — PX: ANTERIOR CERVICAL DECOMP/DISCECTOMY FUSION: SHX1161

## 2014-03-01 SURGERY — ANTERIOR CERVICAL DECOMPRESSION/DISCECTOMY FUSION 1 LEVEL
Anesthesia: General | Site: Neck

## 2014-03-01 MED ORDER — HEMOSTATIC AGENTS (NO CHARGE) OPTIME
TOPICAL | Status: DC | PRN
  Administered 2014-03-01: 1 via TOPICAL

## 2014-03-01 MED ORDER — GLATIRAMER ACETATE 40 MG/ML ~~LOC~~ SOSY: 40.0000 mg | PREFILLED_SYRINGE | SUBCUTANEOUS | Status: DC

## 2014-03-01 MED ORDER — ROCURONIUM BROMIDE 50 MG/5ML IV SOLN
INTRAVENOUS | Status: AC
  Filled 2014-03-01: qty 1

## 2014-03-01 MED ORDER — ACETAMINOPHEN 650 MG RE SUPP: 650.0000 mg | RECTAL | Status: DC | PRN

## 2014-03-01 MED ORDER — VITAMIN D (ERGOCALCIFEROL) 1.25 MG (50000 UNIT) PO CAPS: 50000.0000 [IU] | ORAL_CAPSULE | ORAL | Status: DC

## 2014-03-01 MED ORDER — SODIUM CHLORIDE 0.9 % IV SOLN
10.0000 mg | INTRAVENOUS | Status: DC | PRN
  Administered 2014-03-01: 25 ug/min via INTRAVENOUS

## 2014-03-01 MED ORDER — PREGABALIN 50 MG PO CAPS
150.0000 mg | ORAL_CAPSULE | Freq: Every day | ORAL | Status: DC
Start: 2014-03-02 — End: 2014-03-01

## 2014-03-01 MED ORDER — MIDAZOLAM HCL 2 MG/2ML IJ SOLN
INTRAMUSCULAR | Status: AC
  Filled 2014-03-01: qty 2

## 2014-03-01 MED ORDER — ACETAMINOPHEN 325 MG PO TABS: 650.0000 mg | ORAL_TABLET | ORAL | Status: DC | PRN

## 2014-03-01 MED ORDER — CEFAZOLIN SODIUM 1-5 GM-% IV SOLN
1.0000 g | Freq: Three times a day (TID) | INTRAVENOUS | Status: DC
  Administered 2014-03-01: 1 g via INTRAVENOUS
  Filled 2014-03-01 (×2): qty 50

## 2014-03-01 MED ORDER — LIDOCAINE HCL (CARDIAC) 20 MG/ML IV SOLN
INTRAVENOUS | Status: DC | PRN
  Administered 2014-03-01: 160 mg via INTRAVENOUS

## 2014-03-01 MED ORDER — FENTANYL CITRATE 0.05 MG/ML IJ SOLN
INTRAMUSCULAR | Status: DC | PRN
  Administered 2014-03-01: 50 ug via INTRAVENOUS
  Administered 2014-03-01 (×3): 100 ug via INTRAVENOUS
  Administered 2014-03-01: 50 ug via INTRAVENOUS
  Administered 2014-03-01: 100 ug via INTRAVENOUS

## 2014-03-01 MED ORDER — ASPIRIN EC 81 MG PO TBEC
81.0000 mg | DELAYED_RELEASE_TABLET | Freq: Every day | ORAL | Status: DC
  Administered 2014-03-01: 81 mg via ORAL
  Filled 2014-03-01: qty 1

## 2014-03-01 MED ORDER — OXYCODONE-ACETAMINOPHEN 5-325 MG PO TABS
1.0000 | ORAL_TABLET | ORAL | Status: DC | PRN
  Administered 2014-03-01 (×3): 2 via ORAL
  Filled 2014-03-01 (×3): qty 2

## 2014-03-01 MED ORDER — ONDANSETRON HCL 4 MG/2ML IJ SOLN: 4.0000 mg | INTRAMUSCULAR | Status: DC | PRN

## 2014-03-01 MED ORDER — FENTANYL CITRATE 0.05 MG/ML IJ SOLN
INTRAMUSCULAR | Status: AC
  Filled 2014-03-01: qty 5

## 2014-03-01 MED ORDER — ALUM & MAG HYDROXIDE-SIMETH 200-200-20 MG/5ML PO SUSP: 30.0000 mL | Freq: Four times a day (QID) | ORAL | Status: DC | PRN

## 2014-03-01 MED ORDER — CYCLOBENZAPRINE HCL 10 MG PO TABS
10.0000 mg | ORAL_TABLET | Freq: Three times a day (TID) | ORAL | Status: DC | PRN
  Administered 2014-03-01 (×2): 10 mg via ORAL
  Filled 2014-03-01: qty 1

## 2014-03-01 MED ORDER — LISINOPRIL 20 MG PO TABS
20.0000 mg | ORAL_TABLET | Freq: Every day | ORAL | Status: DC
  Administered 2014-03-01: 20 mg via ORAL
  Filled 2014-03-01: qty 1

## 2014-03-01 MED ORDER — PHENOL 1.4 % MT LIQD
1.0000 | OROMUCOSAL | Status: DC | PRN
  Administered 2014-03-01: 1 via OROMUCOSAL
  Filled 2014-03-01: qty 177

## 2014-03-01 MED ORDER — MENTHOL 3 MG MT LOZG
1.0000 | LOZENGE | OROMUCOSAL | Status: DC | PRN
  Filled 2014-03-01: qty 9

## 2014-03-01 MED ORDER — THROMBIN 5000 UNITS EX SOLR
CUTANEOUS | Status: DC | PRN
  Administered 2014-03-01 (×2): 5000 [IU] via TOPICAL

## 2014-03-01 MED ORDER — DEXAMETHASONE SODIUM PHOSPHATE 10 MG/ML IJ SOLN
INTRAMUSCULAR | Status: AC
  Administered 2014-03-01: 10 mg via INTRAVENOUS
  Filled 2014-03-01: qty 1

## 2014-03-01 MED ORDER — INFLUENZA VAC SPLIT QUAD 0.5 ML IM SUSY
0.5000 mL | PREFILLED_SYRINGE | INTRAMUSCULAR | Status: AC
  Administered 2014-03-01: 0.5 mL via INTRAMUSCULAR
  Filled 2014-03-01: qty 0.5

## 2014-03-01 MED ORDER — CEFAZOLIN SODIUM-DEXTROSE 2-3 GM-% IV SOLR
INTRAVENOUS | Status: AC
  Filled 2014-03-01: qty 50

## 2014-03-01 MED ORDER — SODIUM CHLORIDE 0.9 % IJ SOLN
3.0000 mL | INTRAMUSCULAR | Status: DC | PRN
Start: 2014-03-01 — End: 2014-03-01

## 2014-03-01 MED ORDER — ASPIRIN 81 MG PO TABS: 81.0000 mg | ORAL_TABLET | Freq: Every day | ORAL | Status: DC

## 2014-03-01 MED ORDER — SODIUM CHLORIDE 0.9 % IJ SOLN: 3.0000 mL | Freq: Two times a day (BID) | INTRAMUSCULAR | Status: DC

## 2014-03-01 MED ORDER — ONDANSETRON HCL 4 MG/2ML IJ SOLN
INTRAMUSCULAR | Status: AC
  Filled 2014-03-01: qty 2

## 2014-03-01 MED ORDER — GLYCOPYRROLATE 0.2 MG/ML IJ SOLN
INTRAMUSCULAR | Status: AC
  Filled 2014-03-01: qty 3

## 2014-03-01 MED ORDER — EPHEDRINE SULFATE 50 MG/ML IJ SOLN
INTRAMUSCULAR | Status: DC | PRN
  Administered 2014-03-01: 5 mg via INTRAVENOUS
  Administered 2014-03-01: 10 mg via INTRAVENOUS

## 2014-03-01 MED ORDER — CYCLOBENZAPRINE HCL 10 MG PO TABS
ORAL_TABLET | ORAL | Status: AC
  Filled 2014-03-01: qty 1

## 2014-03-01 MED ORDER — SENNA 8.6 MG PO TABS
1.0000 | ORAL_TABLET | Freq: Two times a day (BID) | ORAL | Status: DC
  Administered 2014-03-01: 8.6 mg via ORAL
  Filled 2014-03-01: qty 1

## 2014-03-01 MED ORDER — PROPOFOL 10 MG/ML IV BOLUS
INTRAVENOUS | Status: DC | PRN
  Administered 2014-03-01: 200 mg via INTRAVENOUS

## 2014-03-01 MED ORDER — LACTATED RINGERS IV SOLN
INTRAVENOUS | Status: DC | PRN
  Administered 2014-03-01 (×2): via INTRAVENOUS

## 2014-03-01 MED ORDER — HYDROCODONE-ACETAMINOPHEN 5-325 MG PO TABS: 1.0000 | ORAL_TABLET | ORAL | Status: DC | PRN

## 2014-03-01 MED ORDER — SUCCINYLCHOLINE CHLORIDE 20 MG/ML IJ SOLN
INTRAMUSCULAR | Status: DC | PRN
  Administered 2014-03-01: 80 mg via INTRAVENOUS

## 2014-03-01 MED ORDER — CYCLOBENZAPRINE HCL 10 MG PO TABS: 10.0000 mg | ORAL_TABLET | Freq: Three times a day (TID) | ORAL | Status: DC | PRN

## 2014-03-01 MED ORDER — SODIUM CHLORIDE 0.9 % IR SOLN
Status: DC | PRN
  Administered 2014-03-01: 1000 mL

## 2014-03-01 MED ORDER — TRAMADOL HCL 50 MG PO TABS: 50.0000 mg | ORAL_TABLET | Freq: Three times a day (TID) | ORAL | Status: DC | PRN

## 2014-03-01 MED ORDER — HYDROCHLOROTHIAZIDE 25 MG PO TABS
12.5000 mg | ORAL_TABLET | Freq: Every day | ORAL | Status: DC
  Filled 2014-03-01: qty 0.5

## 2014-03-01 MED ORDER — MIDAZOLAM HCL 5 MG/5ML IJ SOLN
INTRAMUSCULAR | Status: DC | PRN
  Administered 2014-03-01: 2 mg via INTRAVENOUS

## 2014-03-01 MED ORDER — HYDROMORPHONE HCL 1 MG/ML IJ SOLN: 0.5000 mg | INTRAMUSCULAR | Status: DC | PRN

## 2014-03-01 MED ORDER — SODIUM CHLORIDE 0.9 % IV SOLN: 250.0000 mL | INTRAVENOUS | Status: DC

## 2014-03-01 MED ORDER — LIDOCAINE HCL (CARDIAC) 20 MG/ML IV SOLN
INTRAVENOUS | Status: AC
  Filled 2014-03-01: qty 5

## 2014-03-01 MED ORDER — ONDANSETRON HCL 4 MG/2ML IJ SOLN: 4.0000 mg | Freq: Once | INTRAMUSCULAR | Status: DC | PRN

## 2014-03-01 MED ORDER — PROPOFOL 10 MG/ML IV BOLUS
INTRAVENOUS | Status: AC
  Filled 2014-03-01: qty 20

## 2014-03-01 MED ORDER — MODAFINIL 100 MG PO TABS: 200.0000 mg | ORAL_TABLET | Freq: Every day | ORAL | Status: DC

## 2014-03-01 MED ORDER — HYDROMORPHONE HCL 1 MG/ML IJ SOLN
0.2500 mg | INTRAMUSCULAR | Status: DC | PRN
  Administered 2014-03-01 (×2): 0.5 mg via INTRAVENOUS

## 2014-03-01 MED ORDER — ONDANSETRON HCL 4 MG/2ML IJ SOLN
INTRAMUSCULAR | Status: DC | PRN
  Administered 2014-03-01: 4 mg via INTRAVENOUS

## 2014-03-01 MED ORDER — NEOSTIGMINE METHYLSULFATE 10 MG/10ML IV SOLN
INTRAVENOUS | Status: AC
  Filled 2014-03-01: qty 1

## 2014-03-01 MED ORDER — HYDROMORPHONE HCL 1 MG/ML IJ SOLN
INTRAMUSCULAR | Status: AC
  Filled 2014-03-01: qty 1

## 2014-03-01 MED ORDER — SODIUM CHLORIDE 0.9 % IR SOLN
Status: DC | PRN
  Administered 2014-03-01: 500 mL

## 2014-03-01 MED ORDER — HYDROCHLOROTHIAZIDE 12.5 MG PO CAPS
12.5000 mg | ORAL_CAPSULE | Freq: Every day | ORAL | Status: DC
  Administered 2014-03-01: 12.5 mg via ORAL
  Filled 2014-03-01: qty 1

## 2014-03-01 SURGICAL SUPPLY — 52 items
BAG DECANTER FOR FLEXI CONT (MISCELLANEOUS) ×2 IMPLANT
BENZOIN TINCTURE PRP APPL 2/3 (GAUZE/BANDAGES/DRESSINGS) ×2 IMPLANT
BRUSH SCRUB EZ PLAIN DRY (MISCELLANEOUS) ×2 IMPLANT
BUR MATCHSTICK NEURO 3.0 LAGG (BURR) ×2 IMPLANT
CAGE PEEK 7X14X11 (Cage) ×1 IMPLANT
CANISTER SUCT 3000ML (MISCELLANEOUS) ×2 IMPLANT
CONT SPEC 4OZ CLIKSEAL STRL BL (MISCELLANEOUS) ×2 IMPLANT
DRAPE C-ARM 42X72 X-RAY (DRAPES) ×4 IMPLANT
DRAPE LAPAROTOMY 100X72 PEDS (DRAPES) ×2 IMPLANT
DRAPE MICROSCOPE LEICA (MISCELLANEOUS) ×2 IMPLANT
DRAPE POUCH INSTRU U-SHP 10X18 (DRAPES) ×2 IMPLANT
DRILL BIT (BIT) ×2 IMPLANT
DURAPREP 6ML APPLICATOR 50/CS (WOUND CARE) ×2 IMPLANT
ELECT COATED BLADE 2.86 ST (ELECTRODE) ×2 IMPLANT
ELECT REM PT RETURN 9FT ADLT (ELECTROSURGICAL) ×2
ELECTRODE REM PT RTRN 9FT ADLT (ELECTROSURGICAL) ×1 IMPLANT
GAUZE SPONGE 4X4 12PLY STRL (GAUZE/BANDAGES/DRESSINGS) ×2 IMPLANT
GAUZE SPONGE 4X4 16PLY XRAY LF (GAUZE/BANDAGES/DRESSINGS) IMPLANT
GLOVE ECLIPSE 9.0 STRL (GLOVE) ×2 IMPLANT
GLOVE EXAM NITRILE LRG STRL (GLOVE) IMPLANT
GLOVE EXAM NITRILE MD LF STRL (GLOVE) IMPLANT
GLOVE EXAM NITRILE XL STR (GLOVE) IMPLANT
GLOVE EXAM NITRILE XS STR PU (GLOVE) IMPLANT
GOWN STRL REUS W/ TWL LRG LVL3 (GOWN DISPOSABLE) ×1 IMPLANT
GOWN STRL REUS W/ TWL XL LVL3 (GOWN DISPOSABLE) ×1 IMPLANT
GOWN STRL REUS W/TWL 2XL LVL3 (GOWN DISPOSABLE) IMPLANT
GOWN STRL REUS W/TWL LRG LVL3 (GOWN DISPOSABLE) ×1
GOWN STRL REUS W/TWL XL LVL3 (GOWN DISPOSABLE) ×1
HALTER HD/CHIN CERV TRACTION D (MISCELLANEOUS) ×2 IMPLANT
KIT BASIN OR (CUSTOM PROCEDURE TRAY) ×2 IMPLANT
KIT ROOM TURNOVER OR (KITS) ×2 IMPLANT
NEEDLE SPNL 20GX3.5 QUINCKE YW (NEEDLE) ×2 IMPLANT
NS IRRIG 1000ML POUR BTL (IV SOLUTION) ×2 IMPLANT
PACK LAMINECTOMY NEURO (CUSTOM PROCEDURE TRAY) ×2 IMPLANT
PAD ARMBOARD 7.5X6 YLW CONV (MISCELLANEOUS) ×6 IMPLANT
PLATE ELITE VISION 25MM (Plate) ×2 IMPLANT
RUBBERBAND STERILE (MISCELLANEOUS) ×4 IMPLANT
SCREW ST 13X4XST VA NS SPNE (Screw) ×4 IMPLANT
SCREW ST VAR 4 ATL (Screw) ×4 IMPLANT
SPACER SPNL 11X14X7XPEEK CVD (Cage) ×1 IMPLANT
SPCR SPNL 11X14X7XPEEK CVD (Cage) ×1 IMPLANT
SPONGE INTESTINAL PEANUT (DISPOSABLE) ×2 IMPLANT
SPONGE SURGIFOAM ABS GEL SZ50 (HEMOSTASIS) ×2 IMPLANT
STRIP CLOSURE SKIN 1/2X4 (GAUZE/BANDAGES/DRESSINGS) ×2 IMPLANT
SUT PDS AB 5-0 P3 18 (SUTURE) ×2 IMPLANT
SUT VIC AB 3-0 SH 8-18 (SUTURE) ×2 IMPLANT
SYR 20ML ECCENTRIC (SYRINGE) ×2 IMPLANT
TAPE CLOTH 4X10 WHT NS (GAUZE/BANDAGES/DRESSINGS) ×2 IMPLANT
TOWEL OR 17X24 6PK STRL BLUE (TOWEL DISPOSABLE) ×2 IMPLANT
TOWEL OR 17X26 10 PK STRL BLUE (TOWEL DISPOSABLE) ×2 IMPLANT
TRAP SPECIMEN MUCOUS 40CC (MISCELLANEOUS) ×2 IMPLANT
WATER STERILE IRR 1000ML POUR (IV SOLUTION) ×2 IMPLANT

## 2014-03-01 NOTE — Addendum Note (Signed)
Addendum  created 03/01/14 1202 by Octavio Graves   Modules edited: Anesthesia Events, Narrator, Narrator Events, Notes Section   Narrator:  Narrator: Event Log Edited   Narrator Events:  Delete Handoff event   Notes Section:  File: [id number]

## 2014-03-01 NOTE — Anesthesia Postprocedure Evaluation (Signed)
  Anesthesia Post-op Note  Patient: Misty [name]  Procedure(s) Performed: Procedure(s) with comments: ANTERIOR CERVICAL DECOMPRESSION/DISCECTOMY FUSION 1 LEVEL (N/A) - ANTERIOR CERVICAL DECOMPRESSION/DISCECTOMY FUSION 1 LEVEL  Patient Location: PACU  Anesthesia Type:General  Level of Consciousness: awake, alert , oriented and patient cooperative  Airway and Oxygen Therapy: Patient Spontanous Breathing  Post-op Pain: mild  Post-op Assessment: Post-op Vital signs reviewed, Patient's Cardiovascular Status Stable, Respiratory Function Stable, Patent Airway, No signs of Nausea or vomiting and Pain level controlled  Post-op Vital Signs: stable  Last Vitals:  Filed Vitals:   03/01/14 0924  BP: 151/79  Pulse: 103  Temp:   Resp: 18    Complications: No apparent anesthesia complications

## 2014-03-01 NOTE — Anesthesia Procedure Notes (Signed)
Procedure Name: Intubation Date/Time: 03/01/2014 7:50 AM Performed by: Octavio Graves Pre-anesthesia Checklist: Patient identified, Emergency Drugs available, Suction available, Patient being monitored and Timeout performed Patient Re-evaluated:Patient Re-evaluated prior to inductionOxygen Delivery Method: Circle system utilized Preoxygenation: Pre-oxygenation with 100% oxygen Intubation Type: IV induction Ventilation: Mask ventilation without difficulty Laryngoscope Size: Miller and 2 Grade View: Grade I Tube type: Oral Tube size: 7.0 mm Number of attempts: 1 Airway Equipment and Method: Stylet Placement Confirmation: breath sounds checked- equal and bilateral,  ETT inserted through vocal cords under direct vision and positive ETCO2 Secured at: 22 cm Tube secured with: Tape Dental Injury: Teeth and Oropharynx as per pre-operative assessment  Comments: IV induction Smith- intubation AM CRNA- atraumatic teeth and mouth as preop- broken chipped front teeth many missing teeth- bilat BS

## 2014-03-01 NOTE — Transfer of Care (Signed)
Immediate Anesthesia Transfer of Care Note  Patient: Misty [name]  Procedure(s) Performed: Procedure(s) with comments: ANTERIOR CERVICAL DECOMPRESSION/DISCECTOMY FUSION 1 LEVEL (N/A) - ANTERIOR CERVICAL DECOMPRESSION/DISCECTOMY FUSION 1 LEVEL  Patient Location: PACU  Anesthesia Type:General  Level of Consciousness: awake and alert   Airway & Oxygen Therapy: Patient Spontanous Breathing and Patient connected to nasal cannula oxygen  Post-op Assessment: Report given to PACU RN and Post -op Vital signs reviewed and stable  Post vital signs: Reviewed and stable  Complications: No apparent anesthesia complications

## 2014-03-01 NOTE — Discharge Instructions (Signed)
Anterior Cervical Diskectomy and Fusion °Anterior cervical diskectomy is surgery done on the upper spine to relieve pressure on one or more nerve roots, or on the spinal cord. There are 7 bones in your neck, called the cervical spine. These 7 bones (vertebrae) sit one on top of the other. Cushions (intervertebral disks) separate the vertebrae and act like shock absorbers. As we age, degeneration of our bones, joints, and disks can cause neck pain and tightening around the spinal cord and nerve roots. This causes arm pain and weakness.  °Degeneration involves: °· Herniated Disk. With age, the disks dry up and can rupture. In this condition, the center of the disk bulges out (disk herniation). This can cause pressure on a nerve, which produces pain or weakness in the arm. °· Bone spurs and spinal stenosis. As we age, growths often develop on our bones. These growths are called bone spurs (osteophytes). A bone spur is a collection of calcium. As bone spurs grow and extend, the vertebral openings become narrow. The spinal canal and/or the foramen (opening for nerve passageways) become smaller. This narrowing (stenosis) may cause pinching (compression) of the spinal cord or the spinal nerve root. The nerve injury can cause pain, weakness, numbness, and loss of coordination in the upper limbs. Often, patients have difficulty with their hand writing or they start dropping things, because their hand grip is weaker. The spinal cord damage can cause increased stiffness, more frequent falls, electric shooting pain, and changes in bowel and bladder control. °Degeneration in the neck results in three common problems: °· Radiculopathy - Nerve compression that results in weakness or pain that radiates down the arm. °· Myelopathy - Spinal cord compression that causes stiffness, difficulty with walking, coordination, and trouble with bowel or bladder habits. °· Neck pain - Worn out joints cause pain as the neck  moves. °Treatment: °· Radiculopathy - Surgery is performed to remove the bony and disk material that is pushing on the nerve. °· Myelopathy - Surgery is performed to remove the bony and disk material that pushes on the spinal cord. °· Neck pain - Surgery is performed to combine (fuse) the joints of the neck together, so they cannot move or cause pain. °Surgery can be done from the front or the back of the neck. When it is done from the front, it is called an anterior (front) cervical (neck) diskectomy (removal of the disk) and fusion. °LET YOUR CAREGIVER KNOW ABOUT:  °· Recent infections. °· Any shooting pains down your leg, when you move your neck. °· Any difficulty swallowing. °· A smoking history. °· Use of blood thinners or anti-inflammatory medicines. °· Any history of injury to your shoulders. °· Any history of injury to your vocal cords. °· Any foreign objects in your body from a previous surgery. °· Any recent fevers or illness. °· Past medical history (diabetes, strokes). °· Past problems with anesthetics. °· Possibility of pregnancy. °· History of blood clots (deep vein thrombosis). °· History of bleeding or blood problems. °· Past surgeries. °· Other health problems. °· Allergies. °· Medicines you take, including herbs, eye drops, over-the-counter medicines, and creams. °· Use of steroids (by mouth or creams). °RISKS AND COMPLICATIONS °· Infection. °· Bleeding. °· Injury to the following structures: °¨ Carotid artery. This can result in a stroke or significant amount of bleeding. °¨ Esophagus, resulting in difficulty swallowing. °¨ Recurrent laryngeal nerve, resulting in hoarseness of the voice. °¨ Spinal cord injury, ranging from mild to complete quadriparesis (muscle weakness in   all four limbs).  Nerve root injury, resulting in muscle weakness in the upper limb.  Leakage of cerebrospinal fluid. BEFORE THE PROCEDURE   You will be given medicine to help you sleep (general anesthetic), and a  breathing tube will be placed.  You will be given antibiotics to keep the infection rate down.  The incision site on your neck will be marked.  Your neck will be cleaned, to reduce the risk of infection. PROCEDURE  An anterior cervical fusion means that the operation is done through the front (anterior) part of your neck. The cut made by the surgeon (incision) is usually within a skin fold line on the neck. After pushing aside the neck muscles, the surgeon removes the affected, degenerated disk and bone spurs (osteophytes), which takes the pressure off the nerves and spinal cord. This is called a decompression. The area where the disk was removed is then filled with a small piece of plastic. This plastic takes the place of the disk and keeps the nerve passageway (foramen) open and clear for the nerves. In most cases, the surgeon uses metal plates or pins (hardware) in the neck, to help stabilize the level being fused. The hardware reduces motion at that level, so it can fuse. This provides extra support to the neck. A cervical fusion procedure takes anywhere from a couple to several hours, depending on the size of the neck, history of previous surgery, and number of levels being fused. AFTER THE PROCEDURE   You will likely spend 24-48 hours in the hospital. During this time, your caregivers will look for any signs of complications from the procedure.  Your caregiver will watch you, to make sure that fluid draining from the surgery slows down. It is important that a large mass of blood does not form in your neck, which would cause difficulty with breathing.  You will get 24 hours of antibiotics.  You can start to eat as soon as you feel comfortable.  Once you have started eating, walking, urinating (voiding) and having bowel movements on your own, your caregiver will discharge you home. HOME CARE INSTRUCTIONS   For 2 weeks, do not soak the incision site under water. Do not swim or take baths.  Showers are okay, but rinse off the incision sites.  Do not over exert yourself. Allow time for the incision to heal.  It can take from 6 weeks to 6 months for fusion to take effect. Your caregiver may ask you to wear a neck collar during this time, as they check the fusion with multiple (serial) X-rays. Document Released: 02/21/2009 Document Revised: 06/30/2012 Document Reviewed: 02/21/2009 [facility] Patient Information 2015 [place], [place]. This information is not intended to replace advice given to you by your health care provider. Make sure you discuss any questions you have with your health care provider. Wound Care Keep incision area dry.  You may remove outer bandage after 2 days and shower.   If you shower prior cover incision with plastic wrap.  Do not put any creams, lotions, or ointments on incision. Leave steri-strips on neck.  They will fall off by themselves. Activity Walk each and every day, increasing distance each day. No lifting greater than 5 lbs.  Avoid excessive neck motion. No driving for 2 weeks; may ride as a passenger locally. Wear neck brace at all times except when showering. Diet Resume your normal diet.  Return to Work Will be discussed at you follow up appointment. Call Your Doctor If Any of  These Occur Redness, drainage, or swelling at the wound.  Temperature greater than 101 degrees. Severe pain not relieved by pain medication. Increased difficulty swallowing.  Incision starts to come apart. Follow Up Appt Call today and ask for Wells Guiles for appointment in 1-2 weeks ([phone]) or for problems.  If you have any hardware placed in your spine, you will need an x-ray before your appointment.

## 2014-03-01 NOTE — Anesthesia Preprocedure Evaluation (Addendum)
Anesthesia Evaluation  Patient identified by MRN, date of birth, ID band Patient awake    Reviewed: Allergy & Precautions, H&P , NPO status , Patient's Chart, lab work & pertinent test results  Airway Mallampati: II  TM Distance: >3 FB Neck ROM: Full    Dental  (+) Dental Advisory Given, Missing, Chipped, Poor Dentition   Pulmonary Current Smoker,          Cardiovascular hypertension,     Neuro/Psych  Neuromuscular disease    GI/Hepatic GERD-  ,  Endo/Other  Morbid obesity  Renal/GU      Musculoskeletal  (+) Arthritis -,   Abdominal   Peds  Hematology   Anesthesia Other Findings   Reproductive/Obstetrics                            Anesthesia Physical Anesthesia Plan  ASA: III  Anesthesia Plan: General   Post-op Pain Management:    Induction: Intravenous  Airway Management Planned: Oral ETT  Additional Equipment:   Intra-op Plan:   Post-operative Plan: Extubation in OR  Informed Consent: I have reviewed the patients History and Physical, chart, labs and discussed the procedure including the risks, benefits and alternatives for the proposed anesthesia with the patient or authorized representative who has indicated his/her understanding and acceptance.     Plan Discussed with: CRNA, Anesthesiologist and Surgeon  Anesthesia Plan Comments:         Anesthesia Quick Evaluation

## 2014-03-01 NOTE — Transfer of Care (Signed)
Immediate Anesthesia Transfer of Care Note  Patient: Misty [name]  Procedure(s) Performed: Procedure(s) with comments: ANTERIOR CERVICAL DECOMPRESSION/DISCECTOMY FUSION 1 LEVEL (N/A) - ANTERIOR CERVICAL DECOMPRESSION/DISCECTOMY FUSION 1 LEVEL  Patient Location: PACU  Anesthesia Type:General  Level of Consciousness: awake, oriented and patient cooperative  Airway & Oxygen Therapy: Patient Spontanous Breathing  Post-op Assessment: Report given to PACU RN, Post -op Vital signs reviewed and stable and Patient moving all extremities X 4  Post vital signs: stable  Complications: No apparent anesthesia complications

## 2014-03-01 NOTE — Progress Notes (Signed)
Pt given D/C instructions with Rx's, verbal understanding was provided. Pt's incision is open to air with no sign of infection. Pt's IV was removed prior to D/C. Pt D/C'd home via wheelchair @ 1855 per MD order. Pt is stable @ D/C and has no other needs at this time. Holli Humbles, RN

## 2014-03-01 NOTE — Progress Notes (Signed)
Pt. Given cell phone and case back in [facility]

## 2014-03-01 NOTE — Discharge Summary (Signed)
Physician Discharge Summary  Patient ID: Misty [name] MRN: [id number] DOB/AGE: 52-30-1962 52 y.o.  Admit date: 03/01/2014 Discharge date: 03/01/2014  Admission Diagnoses:  Discharge Diagnoses:  Active Problems:   Cervical spondylosis with radiculopathy   Discharged Condition: good  Hospital Course: Patient in the hospital where she underwent uncomplicated anterior cervical discectomy and fusion. Postoperative she is doing very well. Neck and upper extension symptoms much improved. And 20 without difficulty. Ready for discharge home.  Consults:   Significant Diagnostic Studies:   Treatments:   Discharge Exam: Blood pressure 150/85, pulse 71, temperature 98.6 F (37 C), temperature source Oral, resp. rate 16, height 5' 6.5" (1.689 m), weight 102.967 kg (227 lb), SpO2 98 %. Awake and alert. Oriented and appropriate. Motor and sensory function intact. Wound clean and dry. Chest and abdomen benign.  Disposition: 01-Home or Self Care     Medication List    STOP taking these medications        DULoxetine 30 MG capsule  Commonly known as:  CYMBALTA      TAKE these medications        acetaminophen 325 MG tablet  Commonly known as:  TYLENOL  Take 650 mg by mouth every 6 (six) hours as needed.     aspirin 81 MG tablet  Take 81 mg by mouth daily.     COPAXONE 40 MG/ML Sosy  Generic drug:  Glatiramer Acetate  Inject 40 mg into the skin 3 (three) times a week.     cyclobenzaprine 10 MG tablet  Commonly known as:  FLEXERIL  Take 1 tablet (10 mg total) by mouth 3 (three) times daily as needed for muscle spasms.     hydrochlorothiazide 25 MG tablet  Commonly known as:  HYDRODIURIL  Take 0.5 tablets (12.5 mg total) by mouth daily.     HYDROcodone-acetaminophen 5-325 MG per tablet  Commonly known as:  NORCO/VICODIN  Take 1-2 tablets by mouth every 4 (four) hours as needed (Mild pain).     lisinopril 20 MG tablet  Commonly known as:  PRINIVIL,ZESTRIL  Take 1  tablet (20 mg total) by mouth daily. New dose, stop old dose     modafinil 200 MG tablet  Commonly known as:  PROVIGIL  Take 1tab every morning     pregabalin 75 MG capsule  Commonly known as:  LYRICA  Take 150 mg by mouth daily.     traMADol 50 MG tablet  Commonly known as:  ULTRAM  Take 1 tablet (50 mg total) by mouth every 8 (eight) hours as needed.     Vitamin D (Ergocalciferol) 50000 UNITS Caps capsule  Commonly known as:  DRISDOL  Take 50,000 Units by mouth every 7 (seven) days. Sunday           Follow-up Information    Follow up with Charlie Pitter, MD.   Specialty:  Neurosurgery   Contact information:   [address] [place] [address] [phone]       Signed: Charlie Pitter 03/01/2014, 6:34 PM

## 2014-03-01 NOTE — Brief Op Note (Signed)
03/01/2014  8:58 AM  PATIENT:  Misty [name]  53 y.o. female  PRE-OPERATIVE DIAGNOSIS:  stenosis  POST-OPERATIVE DIAGNOSIS:  stenosis  PROCEDURE:  Procedure(s) with comments: ANTERIOR CERVICAL DECOMPRESSION/DISCECTOMY FUSION 1 LEVEL (N/A) - ANTERIOR CERVICAL DECOMPRESSION/DISCECTOMY FUSION 1 LEVEL  SURGEON:  Surgeon(s) and Role:    * Charlie Pitter, MD - Primary  PHYSICIAN ASSISTANT:   ASSISTANTS: none   ANESTHESIA:   general  EBL:     BLOOD ADMINISTERED:none  DRAINS: none   LOCAL MEDICATIONS USED:  NONE  SPECIMEN:  No Specimen  DISPOSITION OF SPECIMEN:  N/A  COUNTS:  YES  TOURNIQUET:  * No tourniquets in log *  DICTATION: .Dragon Dictation  PLAN OF CARE: Admit to inpatient   PATIENT DISPOSITION:  PACU - hemodynamically stable.   Delay start of Pharmacological VTE agent (>24hrs) due to surgical blood loss or risk of bleeding: yes

## 2014-03-01 NOTE — Op Note (Signed)
Date of procedure: 03/01/2014  Date of dictation: Same  Service: Neurosurgery  Preoperative diagnosis: C5-6 herniated nuclear pulposus with radiculopathy  Postoperative diagnosis: Same   Procedure Name: C5-6 anterior cervical discectomy with interbody fusion utilizing interbody peek cage, locally harvested autograft, anterior plate instrumentation.   Surgeon:Laderius Valbuena A.Ulanda Tackett, M.D.  Asst. Surgeon: None   Anesthesia: General  Indication: 53 year old female with severe left upper extremity pain paresthesias and weakness since with a left-sided C6 radiculopathy failing conservative management and workup demonstrates evidence of a broad-based disc herniation with spondylosis and stenosis causing compression the exiting C6 nerve root. Patient presents now for C5-6 anterior cervical discectomy and fusion in hopes of improving her symptoms.  Operative note: After induction anesthesia, patient positioned supine with neck slightly extended and held in place with halter traction. Patient's anterior cervical region prepped and draped sterilely. Incision made overlying C5-6. Dissection proceeds down along the medial border of the sternocleidomastoid muscle and carotid sheath. Trachea and esophagus mobilized and retracted towards the left. Prevertebral fascia stripped off the anterior spinal column. Longus cole muscles elevated bilaterally. Deep self-retaining traction placed intraoperative fluoroscopy used levels were confirmed. Disc space C5-6 and incised 15 blade in rectangle or fractured Y disc space cleanout was then achieved using pituitary rongeurs for tobacco progress Kerrison rongeurs high-speed drill. Almost the disc removed down to the posterior annulus. Marked and brought field these at the remainder of the discectomy. Remaining aspects of annulus and osteophytes removed using high-speed drill down to the level of the posterior longitudinal ligament. Posterior lateral and was elevated and resected so  fashion using Kerrison rongeurs. Underlying thecal sac was identified. Wide central decompression performed by undercutting the bodies of C5 and C6. Decompression then proceeded out each neural foramen. Wide anterior foraminotomies were then performed on the course exiting C6 nerve roots bilaterally. All elements of the disc herniation and spondylosis within the foramen were resected. A very thorough decompression was achieved. There was no evidence of injury to the thecal sac and nerve roots. Wound is then irrigated with and bike solution. Gelfoam was placed topically for hemostasis. 7 mm Medtronic anatomic peek cage was packed with locally harvested autograft and impacted in place. This was recessed slightly from the intervertebral cortical margin. 25 mg Atlantis anterior cervical plate was in place of the C5 and C6 levels. This an attachment or fluoroscopic guidance using 13 mm variable-angle screws 2 each at both levels. All 4 screws given a final tightening found be solidly within the bone. Locking screws with engaged both levels. Final images revealed good position bone graft and instrumentation with normal alignment of the spine. Wounds and irrigated one final time. Hemostasis was achieved with bipolar chart. Wounds and close in layers with Vicryl sutures. Steri-Strips triggers were applied. No apparent complications.t. Patient tolerated the procedure well. She returns to the recovery room.

## 2014-03-02 ENCOUNTER — Encounter (HOSPITAL_COMMUNITY): Payer: Self-pay | Admitting: Neurosurgery

## 2014-03-02 ENCOUNTER — Ambulatory Visit: Payer: Medicare Other | Admitting: Neurology

## 2014-03-08 ENCOUNTER — Telehealth: Payer: Self-pay | Admitting: Neurology

## 2014-03-08 NOTE — Telephone Encounter (Signed)
[phone] please call first thing tomorrow she has appt at 8:45 and needs to talk to someone before the appt

## 2014-03-09 ENCOUNTER — Ambulatory Visit: Payer: Medicare Other | Admitting: Neurology

## 2014-03-09 NOTE — Telephone Encounter (Signed)
I spoke with patient she has had a cervical  Surgery and has been instructed not to drive she will not be in for appt . But will call in 3 weeks to reschedule with Dr Tomi Likens

## 2014-03-11 NOTE — Telephone Encounter (Signed)
appt marked as a no show due to same day canc but we will not send a no show letter / Gayleen Orem.

## 2014-04-01 ENCOUNTER — Other Ambulatory Visit: Payer: Self-pay | Admitting: Physician Assistant

## 2014-04-01 ENCOUNTER — Other Ambulatory Visit: Payer: Self-pay | Admitting: *Deleted

## 2014-04-01 MED ORDER — PREGABALIN 75 MG PO CAPS: 150.0000 mg | ORAL_CAPSULE | Freq: Every day | ORAL | Status: DC

## 2014-04-01 NOTE — Telephone Encounter (Signed)
Patient will need to call office for follow up appointment for next refill

## 2014-04-08 DIAGNOSIS — M5412 Radiculopathy, cervical region: Secondary | ICD-10-CM | POA: Diagnosis not present

## 2014-04-08 DIAGNOSIS — I10 Essential (primary) hypertension: Secondary | ICD-10-CM | POA: Diagnosis not present

## 2014-04-08 DIAGNOSIS — Z6837 Body mass index (BMI) 37.0-37.9, adult: Secondary | ICD-10-CM | POA: Diagnosis not present

## 2014-04-13 ENCOUNTER — Telehealth: Payer: Self-pay | Admitting: Neurology

## 2014-04-13 NOTE — Telephone Encounter (Signed)
Pt moved appt to 1-27-16from 04-30-14

## 2014-04-14 ENCOUNTER — Telehealth: Payer: Self-pay | Admitting: Neurology

## 2014-04-14 ENCOUNTER — Ambulatory Visit (INDEPENDENT_AMBULATORY_CARE_PROVIDER_SITE_OTHER): Payer: Medicare Other | Admitting: Neurology

## 2014-04-14 ENCOUNTER — Encounter: Payer: Self-pay | Admitting: Neurology

## 2014-04-14 VITALS — BP 140/60 | HR 80 | Temp 98.0°F | Resp 18 | Ht 65.5 in | Wt 236.8 lb

## 2014-04-14 DIAGNOSIS — G379 Demyelinating disease of central nervous system, unspecified: Secondary | ICD-10-CM

## 2014-04-14 DIAGNOSIS — Z9889 Other specified postprocedural states: Secondary | ICD-10-CM | POA: Diagnosis not present

## 2014-04-14 MED ORDER — PREGABALIN 75 MG PO CAPS: 150.0000 mg | ORAL_CAPSULE | Freq: Two times a day (BID) | ORAL | Status: DC

## 2014-04-14 MED ORDER — ZOLPIDEM TARTRATE 5 MG PO TABS: 5.0000 mg | ORAL_TABLET | Freq: Every evening | ORAL | Status: DC | PRN

## 2014-04-14 NOTE — Progress Notes (Signed)
NEUROLOGY FOLLOW UP OFFICE NOTE  Misty [name] [id number]  HISTORY OF PRESENT ILLNESS: Misty [name] is a 56 right year old right-handed AA woman with history of MS, idiopathic intracranial hypertension, gastric bypass who follows up for multiple sclerosis.  UPDATE: She is taking Copaxone.    She was complaining of increased pain and tingling involving the left arm and hand, so MRI of the cervical spine was performed on 12/18/13, which revealed disc protrusion affecting the left C6 nerve root.  She underwent anterior cervical discectomy and fusion on 03/01/14 performed by Dr. Earnie Larsson.  She was referred to neurorehab for a functional disability assessment, which was postponed due to the surgery.  However, she dropped the class that required it and no longer needs it.  She reports another fall which was unprovoked.  She does not know why, but she just falls.  She has burning pain in her right shoulder.  She is also feeling increased fatigue during the day but does not sleep at night.   HISTORY: Onset:  2004.  She developed dizziness and headache, described at the time as sharp and shooting bitemporal pain, occurring several times a week.  The dizzy spells would come on suddenly and even cause her to fall.  She saw Dr. Jacolyn Reedy at [facility] who performed MRI of the brain with and without contrast, performed 11/05/02, which revealed nonspecific rare subtle white matter hyperintensities in the subcortical white matter.  She underwent a lumbar puncture on 11/19/02.  She had an opening pressure of 23 cm H2O.  CSF did show oligoclonal bands, but the actual results are not available to me.    MRI of the brain with and without contrast performed 12/26/06 revealed multiple hyperintensities in the subcortical white matter with no abnormal enhancement.  It was not compared to prior images, however.  Incidentally, the cerebellar tonsils were 4-5 mm below the foramen magnum.  MRI of the cervical spine with and  without contrast performed 06/17/07 no demyelinating lesion but did reveal focal central disc bulge at C5-6 with slight displacement of the cord, but no significant cord compression.  MRI of the lumbar spine revealed degenerative changes  MRI of the brain with and without contrast performed 12/24/12 revealed again nonspecific periventricular, subcortical and juxtacortical hyperintensities with no abnormal enhancement.  It does not make mention of comparison to prior scans.  12/24/12 MRI BRAIN W/WO: "few periventricular, subcortical and juxtacortical foci of non-specific gliosis.  No abnormal lesions seen on post contrast views."  She had a follow-up MRI of the brain with and without contrast performed on 08/20/13, which revealed stable non-enhancing non-specific white matter hyperintensities when compared to prior study from 12/22/13.  Repeat Lumbar puncture was performed on 02/03/13.   Opening pressure was slightly elevated at 24.5 cm H2O.  CSF revealed cell count 0, glucose 57, protein 26, 2 oligoclonal bands, IgG index 0.6, myelin basic protein less than 2.0  Initially she was on Betaseron for 6 months.  It was discontinued because of increased liver enzymes.  She was then started on Copaxone, which she took for 4 or 5 years.  She discontinued it about 4 years ago due to loss of insurance.  Denies need for IV steroids in the past.  As per her history, she never seemed to have had any actual flare-up.  She has had chronic symptoms over the years: Diffuse weakness.  Reports that she has had falls because her legs feel weak. Fatigue.  Used to be on Provigil.  Fatigue wasn't an issue until this summer. Muscle spasms and myalgias.  Takes cyclobenzaprine, which helps. Dizziness.  Takes tramadol which helps. Blurred vision No incontinence. Most troublesome are burning painful paresthesias involving the arms (left more than right) and occasionally the right leg.  She also experiences shooting pain down the  back.  She tried gabapentin, which was ineffective.  Previously, she tried amitriptyline, which caused hallucinations.  She takes Lyrica and Cymbalta Disease-modifying agent:  Copaxone.  Previously:  Betaseron (side effects) Past medications:  gabapentin (felt goofy), Cymbalta (ineffective), amitriptyline (hallucinations) Current medications:  cyclobenzaprine 5mg  at bedtime, vitamin D, Lyrica 75mg  twice daily  PAST MEDICAL HISTORY: Past Medical History  Diagnosis Date  . Hypertension   . MS (multiple sclerosis)   . Colon cancer   . Back pain   . Umbilical hernia   . Other hammer toe (acquired) 03/31/2013  . Pneumonia     hx of  . GERD (gastroesophageal reflux disease)   . Diarrhea   . Arthritis   . Insomnia     MEDICATIONS: Current Outpatient Prescriptions on File Prior to Visit  Medication Sig Dispense Refill  . acetaminophen (TYLENOL) 325 MG tablet Take 650 mg by mouth every 6 (six) hours as needed.    Marland Kitchen aspirin 81 MG tablet Take 81 mg by mouth daily.    Marland Kitchen Glatiramer Acetate (COPAXONE) 40 MG/ML SOSY Inject 40 mg into the skin 3 (three) times a week.     . hydrochlorothiazide (HYDRODIURIL) 25 MG tablet Take 0.5 tablets (12.5 mg total) by mouth daily. 21 tablet 0  . lisinopril (PRINIVIL,ZESTRIL) 20 MG tablet Take 1 tablet (20 mg total) by mouth daily. New dose, stop old dose 30 tablet 5  . modafinil (PROVIGIL) 200 MG tablet Take 1tab every morning 30 tablet 0  . traMADol (ULTRAM) 50 MG tablet Take 1 tablet (50 mg total) by mouth every 8 (eight) hours as needed. 30 tablet 2  . Vitamin D, Ergocalciferol, (DRISDOL) 50000 UNITS CAPS capsule Take 50,000 Units by mouth every 7 (seven) days. Sunday    . Vitamin D, Ergocalciferol, (DRISDOL) 50000 UNITS CAPS capsule TAKE ONE CAPSULE BY MOUTH EVERY 7 DAYS 12 capsule 0  . cyclobenzaprine (FLEXERIL) 10 MG tablet Take 1 tablet (10 mg total) by mouth 3 (three) times daily as needed for muscle spasms. (Patient not taking: Reported on 04/14/2014) 30  tablet 0  . HYDROcodone-acetaminophen (NORCO/VICODIN) 5-325 MG per tablet Take 1-2 tablets by mouth every 4 (four) hours as needed (Mild pain). (Patient not taking: Reported on 04/14/2014) 60 tablet 0   No current facility-administered medications on file prior to visit.    ALLERGIES: Allergies  Allergen Reactions  . Phenergan [Promethazine Hcl] Anxiety    FAMILY HISTORY: Family History  Problem Relation Age of Onset  . Stroke Mother   . Hypertension Mother   . Hyperlipidemia Mother   . Diabetes Mother   . Diabetes Brother   . Hypertension Brother   . Hypertension Brother   . Hypertension Brother   . Hypertension Brother   . Hypertension Brother   . Alcoholism Brother     SOCIAL HISTORY: History   Social History  . Marital Status: Single    Spouse Name: N/A    Number of Children: N/A  . Years of Education: N/A   Occupational History  . Not on file.   Social History Main Topics  . Smoking status: Current Some Day Smoker -- 0.25 packs/day for 20 years    Types: Cigarettes  .  Smokeless tobacco: Never Used  . Alcohol Use: 0.0 oz/week    0 Not specified per week     Comment: occasional  . Drug Use: No  . Sexual Activity:    Partners: Male   Other Topics Concern  . Not on file   Social History Narrative    REVIEW OF SYSTEMS: Constitutional: No fevers, chills, or sweats, no generalized fatigue, change in appetite Eyes: No visual changes, double vision, eye pain Ear, nose and throat: No hearing loss, ear pain, nasal congestion, sore throat Cardiovascular: No chest pain, palpitations Respiratory:  No shortness of breath at rest or with exertion, wheezes GastrointestinaI: No nausea, vomiting, diarrhea, abdominal pain, fecal incontinence Genitourinary:  No dysuria, urinary retention or frequency Musculoskeletal:  Neck pain, generalized pain Integumentary: No rash, pruritus, skin lesions Neurological: as above Psychiatric: No depression, insomnia,  anxiety Endocrine: No palpitations, fatigue, diaphoresis, mood swings, change in appetite, change in weight, increased thirst Hematologic/Lymphatic:  No anemia, purpura, petechiae. Allergic/Immunologic: no itchy/runny eyes, nasal congestion, recent allergic reactions, rashes  PHYSICAL EXAM: Filed Vitals:   04/14/14 1124  BP: 140/60  Pulse: 80  Temp: 98 F (36.7 C)  Resp: 18   General: No acute distress Head:  Normocephalic/atraumatic Eyes:  Fundoscopic exam unremarkable without vessel changes, exudates, hemorrhages or papilledema. Neck: supple, no paraspinal tenderness, full range of motion.  Tenderness in the shoulders. Heart:  Regular rate and rhythm Lungs:  Clear to auscultation bilaterally Back: No paraspinal tenderness Neurological Exam: alert and oriented to person, place, and time, recent and remote memory intact, fund of knowledge intact, attention and concentration intact, speech fluent and not dysarthric, language intact.  CN II-XII intact.  Bulk and tone normal.  Pinprick and vibration sensation intact.  Muscle strength 5/5 throughout.  Sensation to pinprick and vibration intact.  Deep tendon reflexes 1+ throughout except absent in ankles.  Finger to nose  testing without dysmetria.  Gait with normal station and stride.  Able to walk in tandem.  Romberg negative.  IMPRESSION: Multiple sclerosis.  More consistent with radiographically isolated syndrome.  She has vague chronic symptoms but never had a flare-up. Recent anterior cervical decompression for radiculopathy  PLAN: Copaxone She was taking Lyrica 150mg  once daily.  Increase to twice daily Ambien 5mg  for sleep Provigil for fatigue D3 50,000 units daily Will refer to [facility] at [facility] for second opinion regarding her diagnosis Follow up in 6 months  Metta Clines, DO  CC: Windell Hummingbird, PA-C

## 2014-04-14 NOTE — Patient Instructions (Signed)
1.  Increase Lyrica 75mg  to 2 tablets (150mg ) twice daily 2.  For sleep, take ambien 5mg  at bedtime as needed. 3.  Continue Provigil 200mg  every morning and vitamin D 50,000 units daily 4.  Refer to the [place] [facility] at [facility] for second opinion 5.  Follow up in 6 months.

## 2014-04-14 NOTE — Telephone Encounter (Signed)
Patient referred to [facility]. Appt made 05/24/2013 at 2:00 pm. They are located to [place] in [facility] and patient is scheduled with Dr George Hugh. Records faxed to [phone] with confirmation received. Called patient to make her aware - but she asked me to call back later.

## 2014-04-14 NOTE — Telephone Encounter (Signed)
Patient made aware of appt date/time.  

## 2014-04-30 ENCOUNTER — Ambulatory Visit: Payer: Medicare Other | Admitting: Neurology

## 2014-05-06 ENCOUNTER — Encounter: Payer: Medicare Other | Admitting: Physician Assistant

## 2014-05-06 ENCOUNTER — Telehealth: Payer: Self-pay

## 2014-05-06 DIAGNOSIS — Z6838 Body mass index (BMI) 38.0-38.9, adult: Secondary | ICD-10-CM | POA: Diagnosis not present

## 2014-05-06 DIAGNOSIS — M5412 Radiculopathy, cervical region: Secondary | ICD-10-CM | POA: Diagnosis not present

## 2014-05-06 NOTE — Telephone Encounter (Signed)
The patient called to ask if someone else could evaluate her if she is unable to come in to see Windell Hummingbird tonight.  She said that Judson Roch told her that she wanted to perform several tests at her next visit, so she wanted to be sure that another provider would be able to do so.  I told her that other providers were able to see Sarah's notes, but I would put in a message for her.  She was requesting a call back regarding the issue.  CB#: [phone]

## 2014-05-06 NOTE — Progress Notes (Signed)
This encounter was created in error - please disregard.  This encounter was created in error - please disregard.

## 2014-05-06 NOTE — Telephone Encounter (Signed)
The patient came in to be seen by Windell Hummingbird tonight, 05/06/14.  Please ignore previous message.  Thank you.

## 2014-05-20 ENCOUNTER — Other Ambulatory Visit: Payer: Self-pay | Admitting: Physician Assistant

## 2014-05-20 ENCOUNTER — Other Ambulatory Visit: Payer: Self-pay | Admitting: Neurology

## 2014-05-21 ENCOUNTER — Telehealth: Payer: Self-pay | Admitting: *Deleted

## 2014-05-21 NOTE — Telephone Encounter (Signed)
Sarah, you normally Rx these for pt as her PCP, but Dr Ouida Sills did last time. Do you want to RF or send to Dr Ouida Sills to review?

## 2014-05-21 NOTE — Telephone Encounter (Signed)
I spoke with patient to let her know that I faxed a RX for her lyrica  This am

## 2014-05-25 NOTE — Telephone Encounter (Signed)
Faxed

## 2014-05-25 NOTE — Telephone Encounter (Signed)
Ready

## 2014-05-26 ENCOUNTER — Other Ambulatory Visit: Payer: Self-pay

## 2014-05-26 NOTE — Telephone Encounter (Signed)
Misty [name], pharm sent req for you to fill Rx for HCTZ 25 mg. It looks like Dr Jacelyn Grip has Rxd for pt in the past, but you are her PCP. Do you want to RF?

## 2014-05-27 MED ORDER — HYDROCHLOROTHIAZIDE 25 MG PO TABS: 12.5000 mg | ORAL_TABLET | Freq: Every day | ORAL | Status: DC

## 2014-05-27 NOTE — Telephone Encounter (Signed)
She needs a recheck before I give her more medications.

## 2014-05-28 NOTE — Telephone Encounter (Signed)
Notified pt of RF and need for f/up for more. She stated that she is going to have to est care elsewhere because she doesn't want to wait at walk in and she is a Medicare pt, so can't be seen as new pt at appt center. She stated it is in process and agreed to get next RFs through new PCP.

## 2014-06-25 ENCOUNTER — Encounter: Payer: Self-pay | Admitting: Podiatry

## 2014-06-25 ENCOUNTER — Ambulatory Visit (INDEPENDENT_AMBULATORY_CARE_PROVIDER_SITE_OTHER): Payer: Medicare Other | Admitting: Podiatry

## 2014-06-25 VITALS — BP 168/92 | HR 70

## 2014-06-25 DIAGNOSIS — Q828 Other specified congenital malformations of skin: Secondary | ICD-10-CM

## 2014-06-25 DIAGNOSIS — M21969 Unspecified acquired deformity of unspecified lower leg: Secondary | ICD-10-CM

## 2014-06-25 DIAGNOSIS — M79606 Pain in leg, unspecified: Secondary | ICD-10-CM | POA: Diagnosis not present

## 2014-06-25 DIAGNOSIS — M2041 Other hammer toe(s) (acquired), right foot: Secondary | ICD-10-CM | POA: Diagnosis not present

## 2014-06-25 NOTE — Progress Notes (Signed)
Subjective: Feet are burning and painful to walk for about a week. Went on a trip to [place] a week ago and been on feet much. Will be out of work for vacation in May and wants to have surgery during vacation. Both feet stings and burning on top.   Objection:  Severe hallux valgus with bunion bilateral. Severe contracted lesser digits with digital corns. Severe plantar calluses under 3rd MPJ area bilateral. Neurovascular status are within normal.  Assessment: Intractable porokeratosis bilateral. Deformed Metatarsal bilateral. Hammer toe deformity bilateral. HAV with bunion bilateral.   Plan: All keratotic lesions debrided. Available treatment options reviewed. Patient wishes to return to discuss surgical intervention.

## 2014-06-25 NOTE — Patient Instructions (Signed)
Seen for painful calluses. All debrided. Return in 10 weeks.

## 2014-06-28 ENCOUNTER — Ambulatory Visit: Payer: Medicare Other | Admitting: Podiatry

## 2014-07-21 DIAGNOSIS — G44221 Chronic tension-type headache, intractable: Secondary | ICD-10-CM | POA: Diagnosis not present

## 2014-07-21 DIAGNOSIS — R202 Paresthesia of skin: Secondary | ICD-10-CM | POA: Diagnosis not present

## 2014-07-21 DIAGNOSIS — G603 Idiopathic progressive neuropathy: Secondary | ICD-10-CM | POA: Diagnosis not present

## 2014-07-21 DIAGNOSIS — G935 Compression of brain: Secondary | ICD-10-CM | POA: Diagnosis not present

## 2014-07-23 ENCOUNTER — Telehealth: Payer: Self-pay | Admitting: Neurology

## 2014-07-23 DIAGNOSIS — D518 Other vitamin B12 deficiency anemias: Secondary | ICD-10-CM | POA: Diagnosis not present

## 2014-07-23 DIAGNOSIS — E539 Vitamin B deficiency, unspecified: Secondary | ICD-10-CM | POA: Diagnosis not present

## 2014-07-23 NOTE — Telephone Encounter (Signed)
Left message for patient to call office back I am returning her call

## 2014-07-23 NOTE — Telephone Encounter (Signed)
Pt needs to talk to you about what is going on with her please call [phone]

## 2014-07-24 DIAGNOSIS — D518 Other vitamin B12 deficiency anemias: Secondary | ICD-10-CM | POA: Diagnosis not present

## 2014-07-24 DIAGNOSIS — E539 Vitamin B deficiency, unspecified: Secondary | ICD-10-CM | POA: Diagnosis not present

## 2014-07-25 DIAGNOSIS — E539 Vitamin B deficiency, unspecified: Secondary | ICD-10-CM | POA: Diagnosis not present

## 2014-07-25 DIAGNOSIS — D518 Other vitamin B12 deficiency anemias: Secondary | ICD-10-CM | POA: Diagnosis not present

## 2014-07-26 DIAGNOSIS — D518 Other vitamin B12 deficiency anemias: Secondary | ICD-10-CM | POA: Diagnosis not present

## 2014-07-26 DIAGNOSIS — E539 Vitamin B deficiency, unspecified: Secondary | ICD-10-CM | POA: Diagnosis not present

## 2014-07-27 DIAGNOSIS — D518 Other vitamin B12 deficiency anemias: Secondary | ICD-10-CM | POA: Diagnosis not present

## 2014-07-27 DIAGNOSIS — E539 Vitamin B deficiency, unspecified: Secondary | ICD-10-CM | POA: Diagnosis not present

## 2014-07-28 ENCOUNTER — Other Ambulatory Visit: Payer: Self-pay | Admitting: Psychiatry

## 2014-07-28 ENCOUNTER — Ambulatory Visit (INDEPENDENT_AMBULATORY_CARE_PROVIDER_SITE_OTHER): Payer: Medicare Other | Admitting: Neurology

## 2014-07-28 ENCOUNTER — Encounter: Payer: Self-pay | Admitting: Neurology

## 2014-07-28 VITALS — BP 150/86 | HR 80 | Resp 16 | Ht 64.0 in | Wt 238.5 lb

## 2014-07-28 DIAGNOSIS — R269 Unspecified abnormalities of gait and mobility: Secondary | ICD-10-CM

## 2014-07-28 DIAGNOSIS — R9082 White matter disease, unspecified: Secondary | ICD-10-CM

## 2014-07-28 DIAGNOSIS — M4722 Other spondylosis with radiculopathy, cervical region: Secondary | ICD-10-CM | POA: Diagnosis not present

## 2014-07-28 DIAGNOSIS — G935 Compression of brain: Secondary | ICD-10-CM

## 2014-07-28 DIAGNOSIS — E539 Vitamin B deficiency, unspecified: Secondary | ICD-10-CM | POA: Diagnosis not present

## 2014-07-28 DIAGNOSIS — D518 Other vitamin B12 deficiency anemias: Secondary | ICD-10-CM | POA: Diagnosis not present

## 2014-07-28 DIAGNOSIS — R930 Abnormal findings on diagnostic imaging of skull and head, not elsewhere classified: Secondary | ICD-10-CM

## 2014-07-28 DIAGNOSIS — G932 Benign intracranial hypertension: Secondary | ICD-10-CM

## 2014-07-28 DIAGNOSIS — E538 Deficiency of other specified B group vitamins: Secondary | ICD-10-CM

## 2014-07-28 NOTE — Patient Instructions (Signed)
I would continue care at [facility]

## 2014-07-28 NOTE — Progress Notes (Signed)
NEUROLOGY FOLLOW UP OFFICE NOTE  Misty [name] [id number]  HISTORY OF PRESENT ILLNESS: Misty [name] is a 27 right year old right-handed AA woman with history of MS, idiopathic intracranial hypertension, gastric bypass who follows up for multiple sclerosis.  UPDATE: Based on her history, I didn't think she had MS.  I referred her to the [place] clinic at [facility] for their opinion.  I do not have their report, but she says that they told her it was their feeling that she did not have MS. She was found to have B12 level of 65 and has started shots.  She still feels fatigued and her left hand still tingles.  She is supposed to be undergoing further testing, including a repeat LP.  She is no longer on Copaxone    HISTORY: Onset:  2004.  She developed dizziness and headache, described at the time as sharp and shooting bitemporal pain, occurring several times a week.  The dizzy spells would come on suddenly and even cause her to fall.  She saw Dr. Jacolyn Reedy at [facility] who performed MRI of the brain with and without contrast, performed 11/05/02, which revealed nonspecific rare subtle white matter hyperintensities in the subcortical white matter.  She underwent a lumbar puncture on 11/19/02.  She had an opening pressure of 23 cm H2O.  CSF did show oligoclonal bands, but the actual results are not available to me.    MRI of the brain with and without contrast performed 12/26/06 revealed multiple hyperintensities in the subcortical white matter with no abnormal enhancement.  It was not compared to prior images, however.  Incidentally, the cerebellar tonsils were 4-5 mm below the foramen magnum.  MRI of the cervical spine with and without contrast performed 06/17/07 no demyelinating lesion but did reveal focal central disc bulge at C5-6 with slight displacement of the cord, but no significant cord compression.  MRI of the lumbar spine revealed degenerative changes  MRI of the brain with and without contrast  performed 12/24/12 revealed again nonspecific periventricular, subcortical and juxtacortical hyperintensities with no abnormal enhancement.  It does not make mention of comparison to prior scans.  12/24/12 MRI BRAIN W/WO: "few periventricular, subcortical and juxtacortical foci of non-specific gliosis.  No abnormal lesions seen on post contrast views."  She had a follow-up MRI of the brain with and without contrast performed on 08/20/13, which revealed stable non-enhancing non-specific white matter hyperintensities when compared to prior study from 12/22/13.  She was complaining of increased pain and tingling involving the left arm and hand, so MRI of the cervical spine was performed on 12/18/13, which revealed disc protrusion affecting the left C6 nerve root.  She underwent anterior cervical discectomy and fusion on 03/01/14 performed by Dr. Earnie Larsson.  Repeat Lumbar puncture was performed on 02/03/13.   Opening pressure was slightly elevated at 24.5 cm H2O.  CSF revealed cell count 0, glucose 57, protein 26, 2 oligoclonal bands, IgG index 0.6, myelin basic protein less than 2.0  Initially she was on Betaseron for 6 months.  It was discontinued because of increased liver enzymes.  She was then started on Copaxone, which she took for 4 or 5 years.  She discontinued it about 4 years ago due to loss of insurance, but was restarted on it.  Denies need for IV steroids in the past.  As per her history, she never seemed to have had any actual flare-up.  She has had chronic symptoms over the years: Diffuse weakness.  Reports that  she has had falls because her legs feel weak. Fatigue.  Used to be on Provigil. Fatigue wasn't an issue until this summer. Muscle spasms and myalgias.  Takes cyclobenzaprine, which helps. Dizziness.  Takes tramadol which helps. Blurred vision No incontinence. Most troublesome are burning painful paresthesias involving the arms (left more than right) and occasionally the right leg.   She also experiences shooting pain down the back.  She tried gabapentin, which was ineffective.  Previously, she tried amitriptyline, which caused hallucinations.  She takes Lyrica and Cymbalta Disease-modifying agent:  Copaxone.  Previously:  Betaseron (side effects) Past medications:  gabapentin (felt goofy), Cymbalta (ineffective), amitriptyline (hallucinations) Current medications:  cyclobenzaprine 5mg  at bedtime, vitamin D, Lyrica 75mg  twice daily  PAST MEDICAL HISTORY: Past Medical History  Diagnosis Date  . Hypertension   . MS (multiple sclerosis)   . Colon cancer   . Back pain   . Umbilical hernia   . Other hammer toe (acquired) 03/31/2013  . Pneumonia     hx of  . GERD (gastroesophageal reflux disease)   . Diarrhea   . Arthritis   . Insomnia     MEDICATIONS: Current Outpatient Prescriptions on File Prior to Visit  Medication Sig Dispense Refill  . acetaminophen (TYLENOL) 325 MG tablet Take 650 mg by mouth every 6 (six) hours as needed.    Marland Kitchen aspirin 81 MG tablet Take 81 mg by mouth daily.    . carisoprodol (SOMA) 350 MG tablet     . cyclobenzaprine (FLEXERIL) 10 MG tablet Take 1 tablet (10 mg total) by mouth 3 (three) times daily as needed for muscle spasms. 30 tablet 0  . Glatiramer Acetate (COPAXONE) 40 MG/ML SOSY Inject 40 mg into the skin 3 (three) times a week.     . hydrochlorothiazide (HYDRODIURIL) 25 MG tablet Take 0.5 tablets (12.5 mg total) by mouth daily. 45 tablet 0  . HYDROcodone-acetaminophen (NORCO/VICODIN) 5-325 MG per tablet Take 1-2 tablets by mouth every 4 (four) hours as needed (Mild pain). 60 tablet 0  . lisinopril (PRINIVIL,ZESTRIL) 20 MG tablet Take 1 tablet (20 mg total) by mouth daily. New dose, stop old dose 30 tablet 5  . LYRICA 75 MG capsule TAKE TWO CAPSULES BY MOUTH DAILY.  NEEDS OFFICE VISIT 60 capsule 0  . modafinil (PROVIGIL) 200 MG tablet Take 1tab every morning 30 tablet 0  . traMADol (ULTRAM) 50 MG tablet TAKE ONE TABLET BY MOUTH EVERY 8  HOURS AS NEEDED 30 tablet 0  . Vitamin D, Ergocalciferol, (DRISDOL) 50000 UNITS CAPS capsule Take 50,000 Units by mouth every 7 (seven) days. Sunday    . zolpidem (AMBIEN) 5 MG tablet Take 1 tablet (5 mg total) by mouth at bedtime as needed for sleep. 30 tablet 0   No current facility-administered medications on file prior to visit.    ALLERGIES: Allergies  Allergen Reactions  . Phenergan [Promethazine Hcl] Anxiety  . Promethazine Anxiety    FAMILY HISTORY: Family History  Problem Relation Age of Onset  . Stroke Mother   . Hypertension Mother   . Hyperlipidemia Mother   . Diabetes Mother   . Diabetes Brother   . Hypertension Brother   . Hypertension Brother   . Hypertension Brother   . Hypertension Brother   . Hypertension Brother   . Alcoholism Brother     SOCIAL HISTORY: History   Social History  . Marital Status: Single    Spouse Name: N/A  . Number of Children: N/A  . Years of Education:  N/A   Occupational History  . Not on file.   Social History Main Topics  . Smoking status: Current Some Day Smoker -- 0.25 packs/day for 20 years    Types: Cigarettes  . Smokeless tobacco: Never Used  . Alcohol Use: 0.0 oz/week    0 Standard drinks or equivalent per week     Comment: occasional  . Drug Use: No  . Sexual Activity:    Partners: Male   Other Topics Concern  . Not on file   Social History Narrative    REVIEW OF SYSTEMS: Constitutional: No fevers, chills, or sweats, no generalized fatigue, change in appetite Eyes: No visual changes, double vision, eye pain Ear, nose and throat: No hearing loss, ear pain, nasal congestion, sore throat Cardiovascular: No chest pain, palpitations Respiratory:  No shortness of breath at rest or with exertion, wheezes GastrointestinaI: No nausea, vomiting, diarrhea, abdominal pain, fecal incontinence Genitourinary:  No dysuria, urinary retention or frequency Musculoskeletal:  No neck pain, back pain Integumentary: No  rash, pruritus, skin lesions Neurological: as above Psychiatric: No depression, insomnia, anxiety Endocrine: No palpitations, fatigue, diaphoresis, mood swings, change in appetite, change in weight, increased thirst Hematologic/Lymphatic:  No anemia, purpura, petechiae. Allergic/Immunologic: no itchy/runny eyes, nasal congestion, recent allergic reactions, rashes  PHYSICAL EXAM: Filed Vitals:   07/28/14 1517  BP: 150/86  Pulse: 80  Resp: 16   General: No acute distress Head:  Normocephalic/atraumatic Eyes:  Fundoscopic exam unremarkable without vessel changes, exudates, hemorrhages or papilledema. Neck: supple, no paraspinal tenderness, full range of motion Heart:  Regular rate and rhythm Lungs:  Clear to auscultation bilaterally Back: No paraspinal tenderness Neurological Exam: alert and oriented to person, place, and time. Attention span and concentration intact, recent and remote memory intact, fund of knowledge intact.  Speech fluent and not dysarthric, language intact.  CN II-XII intact. Fundoscopic exam unremarkable without vessel changes, exudates, hemorrhages or papilledema.  Bulk and tone normal, muscle strength 5/5 throughout.  Sensation to light touch, temperature and vibration intact.  Deep tendon reflexes 1+ throughout, toes downgoing.  Finger to nose and heel to shin testing intact.  Gait normal, Romberg negative.  IMPRESSION: Abnormal white matter on MRI of brain.  Unclear etiology but it does not seem like MS B12 deficiency Idiopathic intracranial hypertension  PLAN: She will continue care at [facility].  25 minutes spent with patient, over 50% spent discussing testing and plan.  Misty Clines, DO

## 2014-07-29 DIAGNOSIS — D518 Other vitamin B12 deficiency anemias: Secondary | ICD-10-CM | POA: Diagnosis not present

## 2014-07-29 DIAGNOSIS — E539 Vitamin B deficiency, unspecified: Secondary | ICD-10-CM | POA: Diagnosis not present

## 2014-07-30 DIAGNOSIS — D518 Other vitamin B12 deficiency anemias: Secondary | ICD-10-CM | POA: Diagnosis not present

## 2014-07-30 DIAGNOSIS — E539 Vitamin B deficiency, unspecified: Secondary | ICD-10-CM | POA: Diagnosis not present

## 2014-07-31 DIAGNOSIS — E539 Vitamin B deficiency, unspecified: Secondary | ICD-10-CM | POA: Diagnosis not present

## 2014-07-31 DIAGNOSIS — D518 Other vitamin B12 deficiency anemias: Secondary | ICD-10-CM | POA: Diagnosis not present

## 2014-08-03 ENCOUNTER — Ambulatory Visit
Admission: RE | Admit: 2014-08-03 | Discharge: 2014-08-03 | Disposition: A | Discharge status: Home / Self Care | Payer: Medicare Other | Source: Ambulatory Visit | Attending: Psychiatry | Admitting: Psychiatry

## 2014-08-03 DIAGNOSIS — R269 Unspecified abnormalities of gait and mobility: Secondary | ICD-10-CM

## 2014-08-03 DIAGNOSIS — G935 Compression of brain: Secondary | ICD-10-CM

## 2014-08-03 DIAGNOSIS — R2689 Other abnormalities of gait and mobility: Secondary | ICD-10-CM | POA: Diagnosis not present

## 2014-08-03 MED ORDER — GADOBENATE DIMEGLUMINE 529 MG/ML IV SOLN
20.0000 mL | Freq: Once | INTRAVENOUS | Status: AC | PRN
  Administered 2014-08-03: 20 mL via INTRAVENOUS

## 2014-08-04 DIAGNOSIS — R0602 Shortness of breath: Secondary | ICD-10-CM | POA: Diagnosis not present

## 2014-08-04 DIAGNOSIS — I10 Essential (primary) hypertension: Secondary | ICD-10-CM | POA: Diagnosis not present

## 2014-08-04 DIAGNOSIS — R5383 Other fatigue: Secondary | ICD-10-CM | POA: Diagnosis not present

## 2014-08-04 DIAGNOSIS — E559 Vitamin D deficiency, unspecified: Secondary | ICD-10-CM | POA: Diagnosis not present

## 2014-08-04 DIAGNOSIS — E119 Type 2 diabetes mellitus without complications: Secondary | ICD-10-CM | POA: Diagnosis not present

## 2014-08-04 DIAGNOSIS — Z1389 Encounter for screening for other disorder: Secondary | ICD-10-CM | POA: Diagnosis not present

## 2014-08-04 DIAGNOSIS — D518 Other vitamin B12 deficiency anemias: Secondary | ICD-10-CM | POA: Diagnosis not present

## 2014-08-04 DIAGNOSIS — Z0000 Encounter for general adult medical examination without abnormal findings: Secondary | ICD-10-CM | POA: Diagnosis not present

## 2014-08-04 DIAGNOSIS — Z716 Tobacco abuse counseling: Secondary | ICD-10-CM | POA: Diagnosis not present

## 2014-08-05 ENCOUNTER — Other Ambulatory Visit: Payer: Self-pay | Admitting: Internal Medicine

## 2014-08-05 DIAGNOSIS — Z1231 Encounter for screening mammogram for malignant neoplasm of breast: Secondary | ICD-10-CM

## 2014-08-11 ENCOUNTER — Ambulatory Visit
Admission: RE | Admit: 2014-08-11 | Discharge: 2014-08-11 | Disposition: A | Discharge status: Home / Self Care | Payer: Medicare Other | Source: Ambulatory Visit | Attending: Internal Medicine | Admitting: Internal Medicine

## 2014-08-11 DIAGNOSIS — Z1231 Encounter for screening mammogram for malignant neoplasm of breast: Secondary | ICD-10-CM | POA: Diagnosis not present

## 2014-08-11 DIAGNOSIS — D518 Other vitamin B12 deficiency anemias: Secondary | ICD-10-CM | POA: Diagnosis not present

## 2014-08-11 DIAGNOSIS — R7309 Other abnormal glucose: Secondary | ICD-10-CM | POA: Diagnosis not present

## 2014-08-11 DIAGNOSIS — E559 Vitamin D deficiency, unspecified: Secondary | ICD-10-CM | POA: Diagnosis not present

## 2014-08-11 DIAGNOSIS — G43909 Migraine, unspecified, not intractable, without status migrainosus: Secondary | ICD-10-CM | POA: Diagnosis not present

## 2014-08-18 DIAGNOSIS — R109 Unspecified abdominal pain: Secondary | ICD-10-CM | POA: Diagnosis not present

## 2014-08-18 DIAGNOSIS — Z8719 Personal history of other diseases of the digestive system: Secondary | ICD-10-CM | POA: Diagnosis not present

## 2014-08-18 DIAGNOSIS — Z85038 Personal history of other malignant neoplasm of large intestine: Secondary | ICD-10-CM | POA: Diagnosis not present

## 2014-08-18 DIAGNOSIS — K589 Irritable bowel syndrome without diarrhea: Secondary | ICD-10-CM | POA: Diagnosis not present

## 2014-08-18 DIAGNOSIS — R197 Diarrhea, unspecified: Secondary | ICD-10-CM | POA: Diagnosis not present

## 2014-08-20 DIAGNOSIS — L253 Unspecified contact dermatitis due to other chemical products: Secondary | ICD-10-CM | POA: Diagnosis not present

## 2014-08-20 DIAGNOSIS — R21 Rash and other nonspecific skin eruption: Secondary | ICD-10-CM | POA: Diagnosis not present

## 2014-08-22 ENCOUNTER — Other Ambulatory Visit: Payer: Self-pay | Admitting: Physician Assistant

## 2014-08-24 DIAGNOSIS — R197 Diarrhea, unspecified: Secondary | ICD-10-CM | POA: Diagnosis not present

## 2014-08-27 DIAGNOSIS — L253 Unspecified contact dermatitis due to other chemical products: Secondary | ICD-10-CM | POA: Diagnosis not present

## 2014-08-30 ENCOUNTER — Telehealth: Payer: Self-pay | Admitting: *Deleted

## 2014-08-30 NOTE — Telephone Encounter (Signed)
Patient would like for you to  Give her a call back Call back number [phone]

## 2014-08-31 ENCOUNTER — Other Ambulatory Visit: Payer: Self-pay | Admitting: *Deleted

## 2014-08-31 NOTE — Telephone Encounter (Signed)
Patient has appt with Dr George Hugh tomorrow at 11:15 am  This is her new Neurologist  She is aware of that .

## 2014-09-01 DIAGNOSIS — I10 Essential (primary) hypertension: Secondary | ICD-10-CM | POA: Diagnosis not present

## 2014-09-01 DIAGNOSIS — R930 Abnormal findings on diagnostic imaging of skull and head, not elsewhere classified: Secondary | ICD-10-CM | POA: Diagnosis not present

## 2014-09-01 DIAGNOSIS — Z7982 Long term (current) use of aspirin: Secondary | ICD-10-CM | POA: Diagnosis not present

## 2014-09-01 DIAGNOSIS — E538 Deficiency of other specified B group vitamins: Secondary | ICD-10-CM | POA: Diagnosis not present

## 2014-09-07 DIAGNOSIS — R21 Rash and other nonspecific skin eruption: Secondary | ICD-10-CM | POA: Diagnosis not present

## 2014-09-09 ENCOUNTER — Other Ambulatory Visit: Payer: Self-pay | Admitting: Gastroenterology

## 2014-09-09 DIAGNOSIS — K2270 Barrett's esophagus without dysplasia: Secondary | ICD-10-CM | POA: Diagnosis not present

## 2014-09-09 DIAGNOSIS — R197 Diarrhea, unspecified: Secondary | ICD-10-CM | POA: Diagnosis not present

## 2014-09-09 DIAGNOSIS — Z85038 Personal history of other malignant neoplasm of large intestine: Secondary | ICD-10-CM | POA: Diagnosis not present

## 2014-09-09 DIAGNOSIS — Z8719 Personal history of other diseases of the digestive system: Secondary | ICD-10-CM | POA: Diagnosis not present

## 2014-09-09 DIAGNOSIS — K219 Gastro-esophageal reflux disease without esophagitis: Secondary | ICD-10-CM | POA: Diagnosis not present

## 2014-09-09 DIAGNOSIS — K644 Residual hemorrhoidal skin tags: Secondary | ICD-10-CM | POA: Diagnosis not present

## 2014-09-09 DIAGNOSIS — K228 Other specified diseases of esophagus: Secondary | ICD-10-CM | POA: Diagnosis not present

## 2014-09-22 ENCOUNTER — Other Ambulatory Visit: Payer: Self-pay | Admitting: Physician Assistant

## 2014-09-23 DIAGNOSIS — I10 Essential (primary) hypertension: Secondary | ICD-10-CM | POA: Diagnosis not present

## 2014-09-24 ENCOUNTER — Other Ambulatory Visit: Payer: Self-pay

## 2014-09-24 ENCOUNTER — Encounter: Payer: Self-pay | Admitting: Podiatry

## 2014-09-24 ENCOUNTER — Ambulatory Visit (INDEPENDENT_AMBULATORY_CARE_PROVIDER_SITE_OTHER): Payer: Medicare Other | Admitting: Podiatry

## 2014-09-24 ENCOUNTER — Ambulatory Visit: Payer: Medicare Other | Admitting: Podiatry

## 2014-09-24 ENCOUNTER — Telehealth: Payer: Self-pay

## 2014-09-24 VITALS — BP 165/89 | HR 87

## 2014-09-24 DIAGNOSIS — Q828 Other specified congenital malformations of skin: Secondary | ICD-10-CM | POA: Diagnosis not present

## 2014-09-24 DIAGNOSIS — M2010 Hallux valgus (acquired), unspecified foot: Secondary | ICD-10-CM | POA: Diagnosis not present

## 2014-09-24 DIAGNOSIS — M21969 Unspecified acquired deformity of unspecified lower leg: Secondary | ICD-10-CM

## 2014-09-24 DIAGNOSIS — M21619 Bunion of unspecified foot: Secondary | ICD-10-CM | POA: Insufficient documentation

## 2014-09-24 MED ORDER — LISINOPRIL 20 MG PO TABS: 20.0000 mg | ORAL_TABLET | Freq: Every day | ORAL | Status: DC

## 2014-09-24 NOTE — Patient Instructions (Signed)
Painful calluses debrided on both feet. Return as needed. Return on 10/11/14 for pre op.

## 2014-09-24 NOTE — Telephone Encounter (Signed)
Tramadol called into [facility].

## 2014-09-24 NOTE — Progress Notes (Signed)
Subjective: Calluses are painful on both feet.   Objection:  Severe hallux valgus with bunion bilateral. Severe contracted lesser digits with digital corns. Severe plantar calluses under 3rd MPJ area bilateral. Neurovascular status are within normal.  Assessment: Intractable porokeratosis bilateral. Deformed Metatarsal bilateral. Hammer toe deformity bilateral. HAV with bunion bilateral.   Plan: All keratotic lesions debrided. Patient will return for pre op on 10/11/14 for Right foot bunionectomy.

## 2014-09-28 ENCOUNTER — Other Ambulatory Visit: Payer: Self-pay | Admitting: Family Medicine

## 2014-10-12 ENCOUNTER — Encounter: Payer: Self-pay | Admitting: Podiatry

## 2014-10-12 ENCOUNTER — Ambulatory Visit (INDEPENDENT_AMBULATORY_CARE_PROVIDER_SITE_OTHER): Payer: Medicare Other | Admitting: Podiatry

## 2014-10-12 VITALS — BP 141/82 | HR 68

## 2014-10-12 DIAGNOSIS — M21611 Bunion of right foot: Secondary | ICD-10-CM

## 2014-10-12 DIAGNOSIS — M79604 Pain in right leg: Secondary | ICD-10-CM | POA: Diagnosis not present

## 2014-10-12 DIAGNOSIS — M2041 Other hammer toe(s) (acquired), right foot: Secondary | ICD-10-CM

## 2014-10-12 DIAGNOSIS — M2011 Hallux valgus (acquired), right foot: Secondary | ICD-10-CM | POA: Diagnosis not present

## 2014-10-12 NOTE — Progress Notes (Signed)
Subjective: Patient presents to discuss surgical intervention on right foot painful bunion, corns and calluses.   Objection:  Severe hallux valgus with bunion bilateral. Severe contracted lesser digits with digital corns. Severe plantar calluses under 3rd MPJ area bilateral. Neurovascular status are within normal.  Assessment: Intractable porokeratosis bilateral. Deformed Metatarsal bilateral. Hammer toe deformity bilateral. HAV with bunion bilateral.   Plan: All keratotic lesions debrided. Available treatment options reviewed. McBride bunionectomy right to reduce painful bump. Bone is too short to consider [employer].  Hammer toe repair at DIPJ 2nd right to reduce painful distal clavi.. EDL tenotomy 3rd right to reduce pressure under 3rd MPJ.

## 2014-10-12 NOTE — Patient Instructions (Signed)
Seen for painful bunion, corns and calluses. Surgery consent form reviewed for bunionectomy and digital surgery right foot.

## 2014-10-13 ENCOUNTER — Ambulatory Visit: Payer: Medicare Other | Admitting: Neurology

## 2014-10-14 DIAGNOSIS — M6700 Short Achilles tendon (acquired), unspecified ankle: Secondary | ICD-10-CM | POA: Diagnosis not present

## 2014-10-14 DIAGNOSIS — M2020 Hallux rigidus, unspecified foot: Secondary | ICD-10-CM | POA: Diagnosis not present

## 2014-10-14 DIAGNOSIS — M2040 Other hammer toe(s) (acquired), unspecified foot: Secondary | ICD-10-CM | POA: Diagnosis not present

## 2014-10-14 DIAGNOSIS — M2010 Hallux valgus (acquired), unspecified foot: Secondary | ICD-10-CM | POA: Diagnosis not present

## 2014-10-14 HISTORY — PX: OTHER SURGICAL HISTORY: SHX169

## 2014-10-14 HISTORY — PX: BUNIONECTOMY: SHX129

## 2014-10-14 HISTORY — PX: TENOTOMY: SHX397

## 2014-10-19 ENCOUNTER — Encounter: Payer: Self-pay | Admitting: Podiatry

## 2014-10-19 ENCOUNTER — Ambulatory Visit (INDEPENDENT_AMBULATORY_CARE_PROVIDER_SITE_OTHER): Payer: Medicare Other | Admitting: Podiatry

## 2014-10-19 VITALS — BP 140/85 | HR 78

## 2014-10-19 DIAGNOSIS — M21611 Bunion of right foot: Secondary | ICD-10-CM

## 2014-10-19 DIAGNOSIS — M2011 Hallux valgus (acquired), right foot: Secondary | ICD-10-CM | POA: Diagnosis not present

## 2014-10-19 DIAGNOSIS — M21969 Unspecified acquired deformity of unspecified lower leg: Secondary | ICD-10-CM | POA: Diagnosis not present

## 2014-10-19 NOTE — Patient Instructions (Signed)
Post op wound healing well. Return in one week.

## 2014-10-19 NOTE — Progress Notes (Signed)
1 week following McBride bunionectomy and Hammer toe repair 2nd right foot.  No complaints. Wound healing is normal without redness or edema. Clean dry wound right foot.  Dressing changed.

## 2014-10-21 ENCOUNTER — Encounter: Payer: Self-pay | Admitting: *Deleted

## 2014-10-26 ENCOUNTER — Encounter: Payer: Self-pay | Admitting: Podiatry

## 2014-10-26 ENCOUNTER — Encounter: Payer: Medicare Other | Admitting: Podiatry

## 2014-10-26 ENCOUNTER — Ambulatory Visit (INDEPENDENT_AMBULATORY_CARE_PROVIDER_SITE_OTHER): Payer: Medicare Other | Admitting: Podiatry

## 2014-10-26 DIAGNOSIS — Z9889 Other specified postprocedural states: Secondary | ICD-10-CM

## 2014-10-26 MED ORDER — OXYCODONE-ACETAMINOPHEN 7.5-325 MG PO TABS: 1.0000 | ORAL_TABLET | Freq: Four times a day (QID) | ORAL | Status: DC | PRN

## 2014-10-26 NOTE — Patient Instructions (Signed)
2 weeks post op wound. Doing well. Return next week for suture removal.

## 2014-10-26 NOTE — Progress Notes (Signed)
2 weeks Status post McBride bunionectomy, Hammer toe repair 2nd, Flexor tenotomy 3rd all right foot. Wound healing normal. Positive of pain with ambulation. No edema or erythema noted. Satisfactory post op progress. Too much pain to remove sutures today. Return in one week for suture removal.

## 2014-10-29 ENCOUNTER — Telehealth: Payer: Self-pay | Admitting: *Deleted

## 2014-10-29 NOTE — Telephone Encounter (Signed)
10/29/14 Dr. Caffie Pinto Patient has called this am and said her foot is throbbing in the middle of her foot(Surgery was 10/14/2014) She cant put any weight on it also and it feels like it is pulling, pt has an appointment on Monday to remove stitches.

## 2014-11-01 ENCOUNTER — Encounter: Payer: Self-pay | Admitting: Podiatry

## 2014-11-01 ENCOUNTER — Ambulatory Visit (INDEPENDENT_AMBULATORY_CARE_PROVIDER_SITE_OTHER): Payer: Medicare Other | Admitting: Podiatry

## 2014-11-01 VITALS — BP 148/91 | HR 102

## 2014-11-01 DIAGNOSIS — Z9889 Other specified postprocedural states: Secondary | ICD-10-CM

## 2014-11-01 NOTE — Progress Notes (Signed)
2 weeks post op following Mcbride bunionectomy, hammer toe repair 2nd, flexor tenotomy 3rd all right foot. Patient has minimum pain. Wound healed well. No sign of edema or erythema.  Sutures removed. Mefix tape placed with instruction. Return in one week.

## 2014-11-01 NOTE — Patient Instructions (Signed)
2 weeks post op. Sutures removed. Use dispensed tape if needed. Return in one week.

## 2014-11-02 ENCOUNTER — Encounter: Payer: Medicare Other | Admitting: Podiatry

## 2014-11-03 DIAGNOSIS — M25462 Effusion, left knee: Secondary | ICD-10-CM | POA: Diagnosis not present

## 2014-11-03 DIAGNOSIS — M25562 Pain in left knee: Secondary | ICD-10-CM | POA: Diagnosis not present

## 2014-11-08 ENCOUNTER — Encounter: Payer: Medicare Other | Admitting: Podiatry

## 2014-11-09 ENCOUNTER — Encounter: Payer: Self-pay | Admitting: Podiatry

## 2014-11-09 ENCOUNTER — Ambulatory Visit (INDEPENDENT_AMBULATORY_CARE_PROVIDER_SITE_OTHER): Payer: Medicare Other | Admitting: Podiatry

## 2014-11-09 DIAGNOSIS — Z9889 Other specified postprocedural states: Secondary | ICD-10-CM

## 2014-11-09 NOTE — Patient Instructions (Signed)
About 4 weeks following McBride and hammer toe surgery. Healing well. Return as needed.

## 2014-11-09 NOTE — Progress Notes (Signed)
3 weeks and 4 days post op 2 weeks post op following Mcbride bunionectomy, hammer toe repair 2nd, flexor tenotomy 3rd all right foot. Patient has minimum pain. Wound healed well. No sign of edema or erythema.  Sutures removed. Mefix tape placed with instruction. Return in one week.

## 2014-11-15 ENCOUNTER — Telehealth: Payer: Self-pay | Admitting: *Deleted

## 2014-11-15 NOTE — Telephone Encounter (Signed)
11/15/14 Dr.Sheard, Patient called today and ask can you write a note, she is in a weight training class, she isn't able to do some of the things required, She needs a note saying she can attend the class but with limitations due to right foot surgery, she isn't able to squat with weight on her shoulders or put pressure on her rt foot. Could you please fax to Att: Durel Salts @ [phone] and she needs the note faxed before lunch tomorrow before her appointment in the afternoon.

## 2014-12-06 DIAGNOSIS — I10 Essential (primary) hypertension: Secondary | ICD-10-CM | POA: Diagnosis not present

## 2014-12-08 ENCOUNTER — Other Ambulatory Visit: Payer: Self-pay | Admitting: Physician Assistant

## 2014-12-09 NOTE — Telephone Encounter (Signed)
LMOM for pt to come in to be seen or call w/f/up plan.

## 2015-01-15 ENCOUNTER — Other Ambulatory Visit: Payer: Self-pay | Admitting: Physician Assistant

## 2015-01-26 ENCOUNTER — Ambulatory Visit: Payer: Medicare Other | Admitting: Student

## 2015-02-12 DIAGNOSIS — M25531 Pain in right wrist: Secondary | ICD-10-CM | POA: Diagnosis not present

## 2015-02-12 DIAGNOSIS — I10 Essential (primary) hypertension: Secondary | ICD-10-CM | POA: Diagnosis not present

## 2015-02-12 DIAGNOSIS — M25462 Effusion, left knee: Secondary | ICD-10-CM | POA: Diagnosis not present

## 2015-02-12 DIAGNOSIS — M25562 Pain in left knee: Secondary | ICD-10-CM | POA: Diagnosis not present

## 2015-02-16 ENCOUNTER — Ambulatory Visit (INDEPENDENT_AMBULATORY_CARE_PROVIDER_SITE_OTHER): Payer: Medicare Other | Admitting: Podiatry

## 2015-02-16 ENCOUNTER — Encounter: Payer: Self-pay | Admitting: Podiatry

## 2015-02-16 VITALS — BP 150/81 | HR 74

## 2015-02-16 DIAGNOSIS — M25571 Pain in right ankle and joints of right foot: Secondary | ICD-10-CM | POA: Diagnosis not present

## 2015-02-16 DIAGNOSIS — Q828 Other specified congenital malformations of skin: Secondary | ICD-10-CM | POA: Diagnosis not present

## 2015-02-16 DIAGNOSIS — L905 Scar conditions and fibrosis of skin: Secondary | ICD-10-CM

## 2015-02-16 DIAGNOSIS — M2042 Other hammer toe(s) (acquired), left foot: Secondary | ICD-10-CM | POA: Diagnosis not present

## 2015-02-16 DIAGNOSIS — M79604 Pain in right leg: Secondary | ICD-10-CM

## 2015-02-16 NOTE — Patient Instructions (Signed)
Seen for painful calluses. All debrided. Return as needed. 

## 2015-02-16 NOTE — Progress Notes (Signed)
4 mo. Post op following Mcbride bunionectomy, hammer toe repair 2nd, flexor tenotomy 3rd all right foot.  Patient complaints the 2nd toe is larger and pain at the first MPJ dorsum under the incision area. Also having much pain from callus under 4th MPJ right, severe painful corn on 5th toe left. She goes to school and having difficulty walking.   Objective: Enlarged 2nd toe at DIPJ, post op.  Pain at the first MPJ with limited joint motion. Severe pain 5th toe left from thick corn covering distal 1/2 of the dorsolateral aspect of the digit. . Severe pain under 4th MPJ plantar from callus.   Assessment: Pain from post op fibrosis first MPJ right. Post surgical enlarged toe 2nd at distal 1/2. Painful porokeratosis sub 4 right. Painful hammer toe with digital corn 5th digit left.  has minimum pain.  Plan: Reviewed findings and available treatment options. First MPJ left foot Injected with mixture of 4 mg Dexamethasone, 4 mg Triamcinolone, and 1 cc of 0.5% Marcaine plain.  Patient tolerated well without difficulty.  All painful corns and calluses debrided. Compressing bandage applied to distal 1/2 of the 2nd digit left with Coban bandage. Instructed to patient to keep it wrapped during the day using Coban bandage to decrease the size.  Patient is to return when lesions get painful again.

## 2015-03-01 DIAGNOSIS — I10 Essential (primary) hypertension: Secondary | ICD-10-CM | POA: Diagnosis not present

## 2015-03-01 DIAGNOSIS — Z23 Encounter for immunization: Secondary | ICD-10-CM | POA: Diagnosis not present

## 2015-03-01 DIAGNOSIS — E539 Vitamin B deficiency, unspecified: Secondary | ICD-10-CM | POA: Diagnosis not present

## 2015-03-01 DIAGNOSIS — E118 Type 2 diabetes mellitus with unspecified complications: Secondary | ICD-10-CM | POA: Diagnosis not present

## 2015-03-01 DIAGNOSIS — E559 Vitamin D deficiency, unspecified: Secondary | ICD-10-CM | POA: Diagnosis not present

## 2015-03-01 DIAGNOSIS — D518 Other vitamin B12 deficiency anemias: Secondary | ICD-10-CM | POA: Diagnosis not present

## 2015-03-01 DIAGNOSIS — Z79899 Other long term (current) drug therapy: Secondary | ICD-10-CM | POA: Diagnosis not present

## 2015-03-01 DIAGNOSIS — M129 Arthropathy, unspecified: Secondary | ICD-10-CM | POA: Diagnosis not present

## 2015-03-14 ENCOUNTER — Encounter (HOSPITAL_BASED_OUTPATIENT_CLINIC_OR_DEPARTMENT_OTHER): Payer: Self-pay | Admitting: *Deleted

## 2015-03-14 ENCOUNTER — Emergency Department (HOSPITAL_BASED_OUTPATIENT_CLINIC_OR_DEPARTMENT_OTHER): Payer: Medicare Other

## 2015-03-14 ENCOUNTER — Emergency Department (HOSPITAL_BASED_OUTPATIENT_CLINIC_OR_DEPARTMENT_OTHER)
Admission: EM | Admit: 2015-03-14 | Discharge: 2015-03-14 | Disposition: A | Discharge status: Home / Self Care | Payer: Medicare Other | Attending: Emergency Medicine | Admitting: Emergency Medicine

## 2015-03-14 DIAGNOSIS — G8929 Other chronic pain: Secondary | ICD-10-CM | POA: Diagnosis not present

## 2015-03-14 DIAGNOSIS — Z8701 Personal history of pneumonia (recurrent): Secondary | ICD-10-CM | POA: Insufficient documentation

## 2015-03-14 DIAGNOSIS — I10 Essential (primary) hypertension: Secondary | ICD-10-CM | POA: Insufficient documentation

## 2015-03-14 DIAGNOSIS — X58XXXA Exposure to other specified factors, initial encounter: Secondary | ICD-10-CM | POA: Insufficient documentation

## 2015-03-14 DIAGNOSIS — G4700 Insomnia, unspecified: Secondary | ICD-10-CM | POA: Insufficient documentation

## 2015-03-14 DIAGNOSIS — M791 Myalgia: Secondary | ICD-10-CM | POA: Insufficient documentation

## 2015-03-14 DIAGNOSIS — Z85038 Personal history of other malignant neoplasm of large intestine: Secondary | ICD-10-CM | POA: Insufficient documentation

## 2015-03-14 DIAGNOSIS — M542 Cervicalgia: Secondary | ICD-10-CM | POA: Insufficient documentation

## 2015-03-14 DIAGNOSIS — Y9289 Other specified places as the place of occurrence of the external cause: Secondary | ICD-10-CM | POA: Diagnosis not present

## 2015-03-14 DIAGNOSIS — M545 Low back pain: Secondary | ICD-10-CM | POA: Insufficient documentation

## 2015-03-14 DIAGNOSIS — T148 Other injury of unspecified body region: Secondary | ICD-10-CM | POA: Insufficient documentation

## 2015-03-14 DIAGNOSIS — S161XXA Strain of muscle, fascia and tendon at neck level, initial encounter: Secondary | ICD-10-CM | POA: Diagnosis not present

## 2015-03-14 DIAGNOSIS — Z79899 Other long term (current) drug therapy: Secondary | ICD-10-CM | POA: Insufficient documentation

## 2015-03-14 DIAGNOSIS — F17210 Nicotine dependence, cigarettes, uncomplicated: Secondary | ICD-10-CM | POA: Insufficient documentation

## 2015-03-14 DIAGNOSIS — M546 Pain in thoracic spine: Secondary | ICD-10-CM | POA: Insufficient documentation

## 2015-03-14 DIAGNOSIS — Z7982 Long term (current) use of aspirin: Secondary | ICD-10-CM | POA: Diagnosis not present

## 2015-03-14 DIAGNOSIS — S79911A Unspecified injury of right hip, initial encounter: Secondary | ICD-10-CM | POA: Insufficient documentation

## 2015-03-14 DIAGNOSIS — M25551 Pain in right hip: Secondary | ICD-10-CM

## 2015-03-14 DIAGNOSIS — Y998 Other external cause status: Secondary | ICD-10-CM | POA: Diagnosis not present

## 2015-03-14 DIAGNOSIS — Y9389 Activity, other specified: Secondary | ICD-10-CM | POA: Diagnosis not present

## 2015-03-14 DIAGNOSIS — T148XXA Other injury of unspecified body region, initial encounter: Secondary | ICD-10-CM

## 2015-03-14 DIAGNOSIS — S39012A Strain of muscle, fascia and tendon of lower back, initial encounter: Secondary | ICD-10-CM | POA: Diagnosis not present

## 2015-03-14 DIAGNOSIS — Z8719 Personal history of other diseases of the digestive system: Secondary | ICD-10-CM | POA: Diagnosis not present

## 2015-03-14 MED ORDER — HYDROCODONE-ACETAMINOPHEN 5-325 MG PO TABS
2.0000 | ORAL_TABLET | Freq: Once | ORAL | Status: AC
  Administered 2015-03-14: 2 via ORAL
  Filled 2015-03-14: qty 2

## 2015-03-14 MED ORDER — METHOCARBAMOL 500 MG PO TABS
1000.0000 mg | ORAL_TABLET | Freq: Once | ORAL | Status: AC
  Administered 2015-03-14: 1000 mg via ORAL
  Filled 2015-03-14: qty 2

## 2015-03-14 MED ORDER — METHOCARBAMOL 500 MG PO TABS: 1000.0000 mg | ORAL_TABLET | Freq: Three times a day (TID) | ORAL | Status: DC | PRN

## 2015-03-14 MED ORDER — IBUPROFEN 600 MG PO TABS: 600.0000 mg | ORAL_TABLET | Freq: Four times a day (QID) | ORAL | Status: DC | PRN

## 2015-03-14 MED ORDER — HYDROCODONE-ACETAMINOPHEN 5-325 MG PO TABS: 1.0000 | ORAL_TABLET | ORAL | Status: DC | PRN

## 2015-03-14 MED ORDER — KETOROLAC TROMETHAMINE 60 MG/2ML IM SOLN
60.0000 mg | Freq: Once | INTRAMUSCULAR | Status: AC
  Administered 2015-03-14: 60 mg via INTRAMUSCULAR
  Filled 2015-03-14: qty 2

## 2015-03-14 MED ORDER — IBUPROFEN 800 MG PO TABS: 800.0000 mg | ORAL_TABLET | Freq: Once | ORAL | Status: DC

## 2015-03-14 NOTE — ED Notes (Signed)
Patient was taken to xray where she states that her left hand was caught between the two beds while she was being transferred.  Patient is able to move all fingers and has good pulses.  Patient adds that this hand is always numb and tingling after her neck surgery and that is not a new issue.  Xray was also notified.

## 2015-03-14 NOTE — ED Notes (Signed)
Patient is alert and oriented x3.  She was given DC instructions and follow up visit instructions.  Patient gave verbal understanding. She was DC ambulatory under her own power to home.  V/S stable.  He was not showing any signs of distress on DC 

## 2015-03-14 NOTE — ED Provider Notes (Signed)
CSN: [id number]   Arrival date & time 03/14/15 1212  History  By signing my name below, I, Misty [name], attest that this documentation has been prepared under the direction and in the presence of Misty Rice, MD. Electronically Signed: Altamease [name], ED Scribe. 03/14/2015. 4:20 PM.  Chief Complaint  Patient presents with  . Back Pain    HPI The history is provided by the patient. No language interpreter was used.   Misty [name] is a 54 y.o. female with history of MS, chronic back pain, and anterior cervical decompression/discectomy fusion who presents to the Emergency Department complaining of constant, atraumatic, 10/10 in severity neck, back and R hip pain. Pain started in right hip 2 days ago. She was having more trouble ambulating. She then started to have low back pain. She's now progressed to having neck pain today. Pain with movement. No recent falls or trauma. No weakness or numbness. No incontinence. No fever or chills. Patient is taking tramadol at home for pain.  Past Medical History  Diagnosis Date  . Hypertension   . MS (multiple sclerosis) ([place])   . Colon cancer ([place])   . Back pain   . Umbilical hernia   . Other hammer toe (acquired) 03/31/2013  . Pneumonia     hx of  . GERD (gastroesophageal reflux disease)   . Diarrhea   . Arthritis   . Insomnia     Past Surgical History  Procedure Laterality Date  . Colon surgery    . Cesarean section    . Foot surgery Right   . Abdominal hysterectomy    . Colonoscopy    . Gastric bypass    . Anterior cervical decomp/discectomy fusion N/A 03/01/2014    Procedure: ANTERIOR CERVICAL DECOMPRESSION/DISCECTOMY FUSION 1 LEVEL;  Surgeon: Misty Pitter, MD;  Location: [facility];  Service: Neurosurgery;  Laterality: N/A;  ANTERIOR CERVICAL DECOMPRESSION/DISCECTOMY FUSION 1 LEVEL  . Bunionectomy Right 10/14/2014    @[facility]   . Hammer toe repair Right 10/14/2014    RT #2, @[facility]   . Tenotomy Right 10/14/2014    RT #3, @[facility]      Family History  Problem Relation Age of Onset  . Stroke Mother   . Hypertension Mother   . Hyperlipidemia Mother   . Diabetes Mother   . Diabetes Brother   . Hypertension Brother   . Hypertension Brother   . Hypertension Brother   . Hypertension Brother   . Hypertension Brother   . Alcoholism Brother     Social History  Substance Use Topics  . Smoking status: Current Some Day Smoker -- 0.25 packs/day for 20 years    Types: Cigarettes  . Smokeless tobacco: Never Used  . Alcohol Use: 0.0 oz/week    0 Standard drinks or equivalent per week     Comment: occasional     Review of Systems  Constitutional: Negative for fever and chills.  Eyes: Negative for visual disturbance.  Respiratory: Negative for shortness of breath.   Cardiovascular: Negative for chest pain.  Gastrointestinal: Negative for nausea, vomiting, abdominal pain and diarrhea.  Genitourinary: Negative for dysuria, flank pain and difficulty urinating.  Musculoskeletal: Positive for myalgias, back pain, arthralgias and neck pain.  Skin: Negative for rash and wound.  Neurological: Negative for dizziness, syncope, weakness, numbness and headaches.  All other systems reviewed and are negative.   Home Medications   Prior to Admission medications   Medication Sig Start Date End Date Taking? Authorizing Provider  acetaminophen (TYLENOL) 325 MG  tablet Take 650 mg by mouth every 6 (six) hours as needed.    Historical Provider, MD  aspirin 81 MG tablet Take 81 mg by mouth daily.    Historical Provider, MD  carisoprodol (SOMA) 350 MG tablet  04/28/14   Historical Provider, MD  cyclobenzaprine (FLEXERIL) 10 MG tablet Take 1 tablet (10 mg total) by mouth 3 (three) times daily as needed for muscle spasms. 03/01/14   Earnie Larsson, MD  Glatiramer Acetate (COPAXONE) 40 MG/ML SOSY Inject 40 mg into the skin 3 (three) times a week.     Historical Provider, MD  hydrochlorothiazide (HYDRODIURIL) 25 MG tablet Take 0.5 tablets (12.5  mg total) by mouth daily. PATIENT NEEDS OFFICE VISIT FOR ADDITIONAL REFILLS 08/23/14   Misty Bale, PA-C  HYDROcodone-acetaminophen (NORCO) 5-325 MG tablet Take 1 tablet by mouth every 4 (four) hours as needed for severe pain. 03/14/15   Misty Rice, MD  ibuprofen (ADVIL,MOTRIN) 600 MG tablet Take 1 tablet (600 mg total) by mouth every 6 (six) hours as needed. 03/14/15   Misty Rice, MD  lisinopril (PRINIVIL,ZESTRIL) 20 MG tablet Take 1 tablet (20 mg total) by mouth daily. PATIENT NEEDS OFFICE VISIT FOR ADDITIONAL REFILLS 09/24/14   Misty P Le, DO  LYRICA 75 MG capsule TAKE TWO CAPSULES BY MOUTH DAILY.  NEEDS OFFICE VISIT 05/21/14   Misty Partridge, DO  methocarbamol (ROBAXIN) 500 MG tablet Take 2 tablets (1,000 mg total) by mouth every 8 (eight) hours as needed for muscle spasms. 03/14/15   Misty Rice, MD  modafinil (PROVIGIL) 200 MG tablet Take 1tab every morning 12/01/13   Misty Partridge, DO  oxyCODONE-acetaminophen (PERCOCET) 7.5-325 MG per tablet Take 1 tablet by mouth every 6 (six) hours as needed. 10/26/14   Misty [name], DPM  traMADol (ULTRAM) 50 MG tablet TAKE ONE TABLET BY MOUTH EVERY 8 HOURS AS NEEDED 09/23/14   Misty Bale, PA-C  Vitamin D, Ergocalciferol, (DRISDOL) 50000 UNITS CAPS capsule Take 50,000 Units by mouth every 7 (seven) days. Sunday    Historical Provider, MD  Vitamin D, Ergocalciferol, (DRISDOL) 50000 UNITS CAPS capsule Take 1 capsule (50,000 Units total) by mouth every 7 (seven) days. PATIENT NEEDS OFFICE VISIT/LABS FOR ADDITIONAL REFILLS 01/20/15   Misty Eagles, PA-C  zolpidem (AMBIEN) 5 MG tablet Take 1 tablet (5 mg total) by mouth at bedtime as needed for sleep. 04/14/14   Misty Partridge, DO    Allergies  Phenergan and Promethazine  Triage Vitals: BP 137/76 mmHg  Pulse 91  Resp 16  Ht 5\' 6"  (1.676 m)  Wt 220 lb (99.791 kg)  BMI 35.53 kg/m2  SpO2 100%  Physical Exam  Constitutional: She is oriented to person, place, and time. She appears well-developed and  well-nourished. No distress.  HENT:  Head: Normocephalic and atraumatic.  Mouth/Throat: Oropharynx is clear and moist.  Eyes: EOM are normal. Pupils are equal, round, and reactive to light.  Neck: Normal range of motion. Neck supple.  Patient with diffuse paracervical tenderness. No definite midline tenderness or step-offs. No meningismus  Cardiovascular: Normal rate and regular rhythm.  Exam reveals no gallop and no friction rub.   No murmur heard. Pulmonary/Chest: Effort normal and breath sounds normal. No respiratory distress. She has no wheezes. She has no rales. She exhibits no tenderness.  Abdominal: Soft. Bowel sounds are normal. She exhibits no distension and no mass. There is no tenderness. There is no rebound and no guarding.  Musculoskeletal: Normal range of motion. She exhibits  tenderness. She exhibits no edema.  Patient with diffuse thoracic muscular tenderness without focal midline. No step-offs. Patient also has diffuse lumbar paraspinal tenderness worse on the right than left. Negative straight leg raise bilaterally. Patient has pain with range of motion of the right hip especially with external rotation. No lower extremity swelling or pain. Distal pulses intact.    Lymphadenopathy:    She has no cervical adenopathy.  Neurological: She is alert and oriented to person, place, and time.  Moves all extremities without deficit. Sensation is fully intact. No saddle anesthesia  Skin: Skin is warm and dry. No rash noted. No erythema.  Psychiatric: She has a normal mood and affect. Her behavior is normal.  Nursing note and vitals reviewed.   ED Course  Procedures   DIAGNOSTIC STUDIES: Oxygen Saturation is 100% on RA, normal by my interpretation.    COORDINATION OF CARE: 3:14 PM Discussed treatment plan which includes pain management and right hip XR with pt at bedside and pt agreed to plan.  Labs Reviewed - No data to display  Imaging Review Dg Hip Unilat With Pelvis 2-3  Views Right  03/14/2015  CLINICAL DATA:  RIGHT hip pain 4 days EXAM: DG HIP (WITH OR WITHOUT PELVIS) 2-3V RIGHT COMPARISON:  None. FINDINGS: Internal located. No pelvic fracture or sacral fracture. Dedicated view of the RIGHT hip demonstrates no fracture dislocation. There is mild joint space narrowing and associated sclerosis. IMPRESSION: Mild osteoarthritis of the hip joints. No evidence of fracture dislocation. Electronically Signed   By: Suzy Bouchard M.D.   On: 03/14/2015 16:16     I personally reviewed and evaluated these images as a part of my medical decision-making.    MDM   Final diagnoses:  Arthralgia of right hip  Muscle strain    I personally performed the services described in this documentation, which was scribed in my presence. The recorded information has been reviewed and is accurate.   Patient presents with acute exacerbation of chronic pain. No known trauma. Appears it started in the hip and then had increasing pain up the back to the neck. She has a normal neurologic exam. Suspicion for infectious cause or spinal cord compression. Possible osteoporosis of the right hip. Will get x-ray of the right hip and treat symptomatically. Anticipate discharge home to follow-up with her spinal surgeon and primary physician.  X-ray with evidence of mild arthritis without acute fracture. Patient's ambulatory in the emergency department. States she is feeling much better. We'll discharge home to follow-up with her primary doctor.  Misty Rice, MD 03/14/15 [phone]

## 2015-03-14 NOTE — ED Notes (Signed)
Pt c/o lower back pain, hip pain and neck pain x 4 days

## 2015-03-14 NOTE — Discharge Instructions (Signed)
Musculoskeletal Pain Musculoskeletal pain is muscle and boney aches and pains. These pains can occur in any part of the body. Your caregiver may treat you without knowing the cause of the pain. They may treat you if blood or urine tests, X-rays, and other tests were normal.  CAUSES There is often not a definite cause or reason for these pains. These pains may be caused by a type of germ (virus). The discomfort may also come from overuse. Overuse includes working out too hard when your body is not fit. Boney aches also come from weather changes. Bone is sensitive to atmospheric pressure changes. HOME CARE INSTRUCTIONS   Ask when your test results will be ready. Make sure you get your test results.  Only take over-the-counter or prescription medicines for pain, discomfort, or fever as directed by your caregiver. If you were given medications for your condition, do not drive, operate machinery or power tools, or sign legal documents for 24 hours. Do not drink alcohol. Do not take sleeping pills or other medications that may interfere with treatment.  Continue all activities unless the activities cause more pain. When the pain lessens, slowly resume normal activities. Gradually increase the intensity and duration of the activities or exercise.  During periods of severe pain, bed rest may be helpful. Lay or sit in any position that is comfortable.  Putting ice on the injured area.  Put ice in a bag.  Place a towel between your skin and the bag.  Leave the ice on for 15 to 20 minutes, 3 to 4 times a day.  Follow up with your caregiver for continued problems and no reason can be found for the pain. If the pain becomes worse or does not go away, it may be necessary to repeat tests or do additional testing. Your caregiver may need to look further for a possible cause. SEEK IMMEDIATE MEDICAL CARE IF:  You have pain that is getting worse and is not relieved by medications.  You develop chest pain  that is associated with shortness or breath, sweating, feeling sick to your stomach (nauseous), or throw up (vomit).  Your pain becomes localized to the abdomen.  You develop any new symptoms that seem different or that concern you. MAKE SURE YOU:   Understand these instructions.  Will watch your condition.  Will get help right away if you are not doing well or get worse.   This information is not intended to replace advice given to you by your health care provider. Make sure you discuss any questions you have with your health care provider.   Document Released: 03/05/2005 Document Revised: 05/28/2011 Document Reviewed: 11/07/2012 Elsevier Interactive Patient Education 2016 Elsevier Inc[employer]  Hip Pain Your hip is the joint between your upper legs and your lower pelvis. The bones, cartilage, tendons, and muscles of your hip joint perform a lot of work each day supporting your body weight and allowing you to move around. Hip pain can range from a minor ache to severe pain in one or both of your hips. Pain may be felt on the inside of the hip joint near the groin, or the outside near the buttocks and upper thigh. You may have swelling or stiffness as well.  HOME CARE INSTRUCTIONS   Take medicines only as directed by your health care provider.  Apply ice to the injured area:  Put ice in a plastic bag.  Place a towel between your skin and the bag.  Leave the ice on for  15-20 minutes at a time, 3-4 times a day.  Keep your leg raised (elevated) when possible to lessen swelling.  Avoid activities that cause pain.  Follow specific exercises as directed by your health care provider.  Sleep with a pillow between your legs on your most comfortable side.  Record how often you have hip pain, the location of the pain, and what it feels like. SEEK MEDICAL CARE IF:   You are unable to put weight on your leg.  Your hip is red or swollen or very tender to touch.  Your pain or swelling  continues or worsens after 1 week.  You have increasing difficulty walking.  You have a fever. SEEK IMMEDIATE MEDICAL CARE IF:   You have fallen.  You have a sudden increase in pain and swelling in your hip. MAKE SURE YOU:   Understand these instructions.  Will watch your condition.  Will get help right away if you are not doing well or get worse.   This information is not intended to replace advice given to you by your health care provider. Make sure you discuss any questions you have with your health care provider.   Document Released: 08/23/2009 Document Revised: 03/26/2014 Document Reviewed: 10/30/2012 Elsevier Interactive Patient Education [employer].

## 2015-03-15 DIAGNOSIS — M542 Cervicalgia: Secondary | ICD-10-CM | POA: Diagnosis not present

## 2015-03-15 DIAGNOSIS — R7303 Prediabetes: Secondary | ICD-10-CM | POA: Diagnosis not present

## 2015-03-15 DIAGNOSIS — E559 Vitamin D deficiency, unspecified: Secondary | ICD-10-CM | POA: Diagnosis not present

## 2015-03-15 DIAGNOSIS — I10 Essential (primary) hypertension: Secondary | ICD-10-CM | POA: Diagnosis not present

## 2015-03-23 ENCOUNTER — Telehealth: Payer: Self-pay | Admitting: Family Medicine

## 2015-03-23 NOTE — Telephone Encounter (Signed)
SPOKE WITH PATIENT AND SHE IS STARTING WITH [facility]y [name] ON 03-24-15 AND SEEING STACY BURNS AS HER PCP.   SHE WILL NO LONGER BE USING [facility].

## 2015-03-24 ENCOUNTER — Ambulatory Visit: Payer: Medicare Other | Admitting: Internal Medicine

## 2015-03-24 DIAGNOSIS — R599 Enlarged lymph nodes, unspecified: Secondary | ICD-10-CM | POA: Diagnosis not present

## 2015-03-28 ENCOUNTER — Ambulatory Visit: Payer: Medicare Other | Admitting: Internal Medicine

## 2015-03-29 DIAGNOSIS — E559 Vitamin D deficiency, unspecified: Secondary | ICD-10-CM | POA: Diagnosis not present

## 2015-03-29 DIAGNOSIS — Z79899 Other long term (current) drug therapy: Secondary | ICD-10-CM | POA: Diagnosis not present

## 2015-03-29 DIAGNOSIS — D518 Other vitamin B12 deficiency anemias: Secondary | ICD-10-CM | POA: Diagnosis not present

## 2015-03-29 DIAGNOSIS — M542 Cervicalgia: Secondary | ICD-10-CM | POA: Diagnosis not present

## 2015-03-29 DIAGNOSIS — I10 Essential (primary) hypertension: Secondary | ICD-10-CM | POA: Diagnosis not present

## 2015-03-29 DIAGNOSIS — E539 Vitamin B deficiency, unspecified: Secondary | ICD-10-CM | POA: Diagnosis not present

## 2015-03-30 ENCOUNTER — Encounter: Payer: Self-pay | Admitting: Internal Medicine

## 2015-03-30 ENCOUNTER — Ambulatory Visit (INDEPENDENT_AMBULATORY_CARE_PROVIDER_SITE_OTHER): Payer: Medicare Other | Admitting: Internal Medicine

## 2015-03-30 ENCOUNTER — Other Ambulatory Visit (INDEPENDENT_AMBULATORY_CARE_PROVIDER_SITE_OTHER): Payer: Medicare Other

## 2015-03-30 ENCOUNTER — Encounter (INDEPENDENT_AMBULATORY_CARE_PROVIDER_SITE_OTHER): Payer: Self-pay

## 2015-03-30 ENCOUNTER — Encounter: Payer: Self-pay | Admitting: Emergency Medicine

## 2015-03-30 VITALS — BP 142/88 | HR 84 | Temp 98.0°F | Resp 18 | Ht 66.0 in | Wt 230.0 lb

## 2015-03-30 DIAGNOSIS — M4722 Other spondylosis with radiculopathy, cervical region: Secondary | ICD-10-CM

## 2015-03-30 DIAGNOSIS — I10 Essential (primary) hypertension: Secondary | ICD-10-CM | POA: Diagnosis not present

## 2015-03-30 DIAGNOSIS — E669 Obesity, unspecified: Secondary | ICD-10-CM

## 2015-03-30 DIAGNOSIS — M542 Cervicalgia: Secondary | ICD-10-CM | POA: Diagnosis not present

## 2015-03-30 DIAGNOSIS — R739 Hyperglycemia, unspecified: Secondary | ICD-10-CM

## 2015-03-30 DIAGNOSIS — E538 Deficiency of other specified B group vitamins: Secondary | ICD-10-CM | POA: Insufficient documentation

## 2015-03-30 LAB — COMPREHENSIVE METABOLIC PANEL
ALT: 22 U/L (ref 0–35)
AST: 33 U/L (ref 0–37)
Albumin: 4.1 g/dL (ref 3.5–5.2)
Alkaline Phosphatase: 91 U/L (ref 39–117)
BUN: 14 mg/dL (ref 6–23)
CO2: 30 mEq/L (ref 19–32)
Calcium: 9.6 mg/dL (ref 8.4–10.5)
Chloride: 102 mEq/L (ref 96–112)
Creatinine, Ser: 0.61 mg/dL (ref 0.40–1.20)
GFR: 131.46 mL/min (ref >60.00)
Glucose, Bld: 101 mg/dL — ABNORMAL HIGH (ref 70–99)
Potassium: 3.7 mEq/L (ref 3.5–5.1)
Sodium: 140 mEq/L (ref 135–145)
Total Bilirubin: 0.6 mg/dL (ref 0.2–1.2)
Total Protein: 7.6 g/dL (ref 6.0–8.3)

## 2015-03-30 LAB — CBC WITH DIFFERENTIAL/PLATELET
Basophils Absolute: 0.1 10*3/uL (ref 0.0–0.1)
Basophils Relative: 0.7 % (ref 0.0–3.0)
Eosinophils Absolute: 0.1 10*3/uL (ref 0.0–0.7)
Eosinophils Relative: 0.9 % (ref 0.0–5.0)
HCT: 40.1 % (ref 36.0–46.0)
Hemoglobin: 13 g/dL (ref 12.0–15.0)
Lymphocytes Relative: 23 % (ref 12.0–46.0)
Lymphs Abs: 1.8 10*3/uL (ref 0.7–4.0)
MCHC: 32.5 g/dL (ref 30.0–36.0)
MCV: 97.3 fl (ref 78.0–100.0)
Monocytes Absolute: 0.5 10*3/uL (ref 0.1–1.0)
Monocytes Relative: 6.9 % (ref 3.0–12.0)
Neutro Abs: 5.4 10*3/uL (ref 1.4–7.7)
Neutrophils Relative %: 68.5 % (ref 43.0–77.0)
Platelets: 270 10*3/uL (ref 150.0–400.0)
RBC: 4.13 Mil/uL (ref 3.87–5.11)
RDW: 13.8 % (ref 11.5–15.5)
WBC: 7.9 10*3/uL (ref 4.0–10.5)

## 2015-03-30 LAB — URIC ACID: Uric Acid, Serum: 5.9 mg/dL (ref 2.4–7.0)

## 2015-03-30 LAB — TSH: TSH: 0.44 u[IU]/mL (ref 0.35–4.50)

## 2015-03-30 NOTE — Progress Notes (Signed)
Subjective:    Patient ID: Misty [name], female    DOB: 1960-07-22, 55 y.o.   MRN: [id number]  HPI She is here to establish with a new pcp.   Her main concerns are her balance/equilibrium.  She has had a lot of falls.  She has a lot of aches and strain in her neck and her back.  4 months ago she started having joints/muscles locking up - it occurs in the right hand, right leg, left forearm and hand and this is where the falls come from.  She was told that she had MS and intracranial hypertension, but has been told more recently that she does not have multiple sclerosis.  She does follow with neurology.  She has tingling in her hands and she was found to be B12 def.  She did have B12 injections daily initially. She was told that was the cause of the tingling.  They have not been able to explain her other symptoms.   She was told she has RA in the past.  She has not seen a rheumatologist.  She is exercising.    Some days she has pain some days she has no pain. 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  She needs a note for school explaining this.  Takes tramadol twice daily for head pressure and neck pain.    She does not feel well and just wants to feel better.  She does not understand why she has the symptoms she does and wants to know how to make them better.   Hypertension: She is taking her medication daily. She is compliant with a low sodium diet.  She denies chest pain, palpitations, edema, shortness of breath . She is exercising regularly.    Medications and allergies reviewed with patient and updated if appropriate.  Patient Active Problem List   Diagnosis Date Noted  . Hammer toe of left foot 02/16/2015  . Fibrosis of skin of lower extremity 02/16/2015  . Status post right foot surgery 10/26/2014  . Bunion 09/24/2014  . Cervical spondylosis with radiculopathy 03/01/2014  . Hammer toe of right foot  01/26/2014  . Pain in lower limb 05/22/2013  . Other hammer toe (acquired) 03/31/2013  . Porokeratosis 03/31/2013  . Pain, foot 03/31/2013  . Metatarsal deformity 03/31/2013  . VITAMIN D DEFICIENCY 04/18/2010  . FATIGUE 04/14/2010  . BACK PAIN WITH RADICULOPATHY 11/29/2008  . MEDIAL MENISCUS TEAR, LEFT 06/30/2008  . BARRETTS ESOPHAGUS 06/28/2008  . OSTEOARTHRITIS, KNEES, BILATERAL 06/28/2008  . OBESITY 05/24/2008  . HYPERTENSION, BENIGN ESSENTIAL 05/24/2008  . IRRITABLE BOWEL SYNDROME 05/24/2008  . BARIATRIC SURGERY STATUS 03/19/2006  . MULTIPLE SCLEROSIS 03/19/2002  . NEOPLASM, MALIGNANT, COLON, HX OF 03/19/1997    Current Outpatient Prescriptions on File Prior to Visit  Medication Sig Dispense Refill  . acetaminophen (TYLENOL) 325 MG tablet Take 650 mg by mouth every 6 (six) hours as needed.    Marland Kitchen aspirin 81 MG tablet Take 81 mg by mouth daily.    . cyclobenzaprine (FLEXERIL) 10 MG tablet Take 1 tablet (10 mg total) by mouth 3 (three) times daily as needed for muscle spasms. 30 tablet 0  . hydrochlorothiazide (HYDRODIURIL) 25 MG tablet Take 0.5 tablets (12.5 mg total) by mouth daily. PATIENT NEEDS OFFICE VISIT FOR ADDITIONAL REFILLS 15 tablet 0  . lisinopril (PRINIVIL,ZESTRIL) 20 MG tablet Take 1 tablet (20 mg total) by mouth daily. PATIENT NEEDS OFFICE VISIT  FOR ADDITIONAL REFILLS 30 tablet 0  . LYRICA 75 MG capsule TAKE TWO CAPSULES BY MOUTH DAILY.  NEEDS OFFICE VISIT 60 capsule 0  . modafinil (PROVIGIL) 200 MG tablet Take 1tab every morning 30 tablet 0  . traMADol (ULTRAM) 50 MG tablet TAKE ONE TABLET BY MOUTH EVERY 8 HOURS AS NEEDED 30 tablet 0  . Vitamin D, Ergocalciferol, (DRISDOL) 50000 UNITS CAPS capsule Take 50,000 Units by mouth every 7 (seven) days. Sunday    . zolpidem (AMBIEN) 5 MG tablet Take 1 tablet (5 mg total) by mouth at bedtime as needed for sleep. 30 tablet 0   No current facility-administered medications on file prior to visit.    Past Medical History    Diagnosis Date  . Hypertension   . MS (multiple sclerosis) ([place])   . Colon cancer ([place])   . Back pain   . Umbilical hernia   . Other hammer toe (acquired) 03/31/2013  . Pneumonia     hx of  . GERD (gastroesophageal reflux disease)   . Diarrhea   . Arthritis   . Insomnia     Past Surgical History  Procedure Laterality Date  . Colon surgery    . Cesarean section    . Foot surgery Right   . Abdominal hysterectomy    . Colonoscopy    . Gastric bypass    . Anterior cervical decomp/discectomy fusion N/A 03/01/2014    Procedure: ANTERIOR CERVICAL DECOMPRESSION/DISCECTOMY FUSION 1 LEVEL;  Surgeon: Charlie Pitter, MD;  Location: [facility];  Service: Neurosurgery;  Laterality: N/A;  ANTERIOR CERVICAL DECOMPRESSION/DISCECTOMY FUSION 1 LEVEL  . Bunionectomy Right 10/14/2014    @[facility]   . Hammer toe repair Right 10/14/2014    RT #2, @[facility]   . Tenotomy Right 10/14/2014    RT #3, @[facility]     Social History   Social History  . Marital Status: Single    Spouse Name: N/A  . Number of Children: N/A  . Years of Education: N/A   Social History Main Topics  . Smoking status: Current Some Day Smoker -- 0.25 packs/day for 20 years    Types: Cigarettes  . Smokeless tobacco: Never Used  . Alcohol Use: 0.0 oz/week    0 Standard drinks or equivalent per week     Comment: occasional  . Drug Use: No  . Sexual Activity:    Partners: Male   Other Topics Concern  . None   Social History Narrative    Family History  Problem Relation Age of Onset  . Stroke Mother   . Hypertension Mother   . Hyperlipidemia Mother   . Diabetes Mother   . Diabetes Brother   . Hypertension Brother   . Hypertension Brother   . Hypertension Brother   . Hypertension Brother   . Hypertension Brother   . Alcoholism Brother     Review of Systems  Constitutional: Negative for fever and chills.  Respiratory: Negative for cough, shortness of breath and wheezing.   Cardiovascular: Negative for chest pain,  palpitations and leg swelling.  Gastrointestinal: Positive for diarrhea. Negative for nausea, abdominal pain and blood in stool.       No GERD  Genitourinary: Negative for dysuria and hematuria.  Musculoskeletal: Positive for back pain (lower back with standing long time), arthralgias (right foot pain (prior surgery), hands) and neck pain. Negative for joint swelling.       Cold hands all the time. Muscle spasms  Skin: Negative for rash.  Neurological: Positive for dizziness (  with quick movements), light-headedness, numbness (right hand, left arm, neck, right lower leg) and headaches (head pressure in occipital region). Negative for weakness.  Psychiatric/Behavioral: Positive for sleep disturbance.       Objective:   Filed Vitals:   03/30/15 1528  BP: 142/88  Pulse: 84  Temp: 98 F (36.7 C)  Resp: 18   Filed Weights   03/30/15 1528  Weight: 230 lb (104.327 kg)   Body mass index is 37.14 kg/(m^2).   Physical Exam  Constitutional: She is oriented to person, place, and time. She appears well-developed and well-nourished. No distress.  HENT:  Head: Normocephalic and atraumatic.  Right Ear: External ear normal.  Left Ear: External ear normal.  Normal ear canals  Eyes: Conjunctivae and EOM are normal.  Neck: Neck supple. No tracheal deviation present. No thyromegaly present.  No carotid bruit  Cardiovascular: Normal rate, regular rhythm and normal heart sounds.   Pulmonary/Chest: Effort normal and breath sounds normal. No respiratory distress. She has no wheezes. She has no rales.  Abdominal: Soft. Bowel sounds are normal. She exhibits no distension. There is no tenderness.  Musculoskeletal: She exhibits edema (mild in left leg, none in right leg).  Lymphadenopathy:    She has no cervical adenopathy.  Neurological: She is alert and oriented to person, place, and time.  Skin: Skin is warm and dry. No rash noted. She is not diaphoretic.  Psychiatric: She has a normal mood and  affect. Her behavior is normal.       Assessment & Plan:   She is not clear on her own history and we have decided to get blood work and have her come back soon to decide what further evaluation we need to do  See Problem List for Assessment and Plan of chronic medical problems

## 2015-03-30 NOTE — Patient Instructions (Addendum)
  We have reviewed your prior records including labs and tests today.  Test(s) ordered today. Your results will be released to [place] (or called to you) after review, usually within 72hours after test completion. If any changes need to be made, you will be notified at that same time.  Medications reviewed and updated.  No changes recommended at this time.  Your prescription(s) have been submitted to your pharmacy. Please take as directed and contact our office if you believe you are having problem(s) with the medication(s).  We will set up an appointment with Dr. Tamala Julian in our office to evaluate your neck pain.    Please schedule followup in 2-3 weeks.

## 2015-03-30 NOTE — Progress Notes (Signed)
Pre visit review using our clinic review tool, if applicable. No additional management support is needed unless otherwise documented below in the visit note. 

## 2015-03-31 ENCOUNTER — Encounter: Payer: Self-pay | Admitting: Internal Medicine

## 2015-03-31 LAB — ANA: Anti Nuclear Antibody(ANA): NEGATIVE

## 2015-03-31 LAB — VITAMIN B12: Vitamin B-12: 172 pg/mL — ABNORMAL LOW (ref 211–911)

## 2015-03-31 LAB — RHEUMATOID FACTOR: Rhuematoid fact SerPl-aCnc: <10 IU/mL (ref <=14)

## 2015-03-31 NOTE — Assessment & Plan Note (Signed)
She has known cervical radiculopathy and some of her neck pain and tingling may be from that S/p cervical discectomy Will refer to Dr Tamala Julian for further evaluation

## 2015-03-31 NOTE — Assessment & Plan Note (Signed)
Check b12 level - if low will need to start monthly b12 injections

## 2015-03-31 NOTE — Assessment & Plan Note (Signed)
BP slightly elevated here today -- will continue current med and have her follow up soon - will adjust meds at that visit if needed Check cmp

## 2015-04-12 ENCOUNTER — Ambulatory Visit: Payer: Medicare Other

## 2015-04-12 ENCOUNTER — Ambulatory Visit (INDEPENDENT_AMBULATORY_CARE_PROVIDER_SITE_OTHER): Payer: Medicare Other | Admitting: Family Medicine

## 2015-04-12 ENCOUNTER — Encounter: Payer: Self-pay | Admitting: Family Medicine

## 2015-04-12 VITALS — BP 134/84 | HR 120 | Ht 66.0 in | Wt 227.0 lb

## 2015-04-12 DIAGNOSIS — M4722 Other spondylosis with radiculopathy, cervical region: Secondary | ICD-10-CM

## 2015-04-12 DIAGNOSIS — E538 Deficiency of other specified B group vitamins: Secondary | ICD-10-CM | POA: Diagnosis not present

## 2015-04-12 DIAGNOSIS — E559 Vitamin D deficiency, unspecified: Secondary | ICD-10-CM | POA: Diagnosis not present

## 2015-04-12 DIAGNOSIS — G35 Multiple sclerosis: Secondary | ICD-10-CM | POA: Diagnosis not present

## 2015-04-12 MED ORDER — CYANOCOBALAMIN 1000 MCG/ML IJ SOLN
1000.0000 ug | Freq: Once | INTRAMUSCULAR | Status: AC
  Administered 2015-04-12: 1000 ug via INTRAMUSCULAR

## 2015-04-12 MED ORDER — PREDNISONE 20 MG PO TABS: 40.0000 mg | ORAL_TABLET | Freq: Every day | ORAL | Status: DC

## 2015-04-12 MED ORDER — VITAMIN D (ERGOCALCIFEROL) 1.25 MG (50000 UNIT) PO CAPS: 50000.0000 [IU] | ORAL_CAPSULE | ORAL | Status: DC

## 2015-04-12 MED ORDER — B-12 1000 MCG/ML IJ KIT: 1000.0000 ug | PACK | INTRAMUSCULAR | Status: DC

## 2015-04-12 NOTE — Progress Notes (Signed)
Pre visit review using our clinic review tool, if applicable. No additional management support is needed unless otherwise documented below in the visit note. 

## 2015-04-12 NOTE — Progress Notes (Signed)
[name] [address] [place], [place] [address] Phone: [phone] Subjective:    I'm seeing this patient by the request  of:  [name], MD   CC: Neck pain  [id number] Misty [name] is a 55 y.o. female coming in with complaint of neck pain. Patient past medical history is significant for multiple sclerosis for patient states that even that is in consideration is a true diagnosis at this moment. Patient also had history of cervical neck arthritis status post C5-C6 fusion. Patient states though that her neck pain seems to be worsening over the course of time. Having some mild radiation in both hands. Having cramping. Having significant difficulty with balance. Patient is also having some trouble with seeing things regularly. When patient states when she was originally diagnosed with multiple sclerosis these were all the different similar findings. Patient was on a certain medication previously. Patient was put on the medication called Betaseron and did well with this until she lost her insurance. Tried another medication for multiple years and was doing well but once again had to discontinue that secondary to loss of insurance. Patient states that she has never had an exacerbation bad enough to have IV prednisone and has never been hospitalized for any type of flare. Patient was seen a neurologist and his note were reviewed by me. Patient was sent for a multiple sclerosis specialist at [facility]. Patient was seen and she states that they clarified her of not having multiple sclerosis but did not give her an answer of which she did have.  Continues to have a burn full painful paresthesia of the arms left greater than right. States though that the cramping continues to give her trouble. Patient has tried multiple different medications and has significant side effects. She is taking Lyrica and Cymbalta fairly regularly without any significant improvement.  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     Reviewed patient's chart Onset: 2004. Marland Kitchen She saw Dr. Jacolyn Reedy at [facility] who performed MRI of the brain with and without contrast, performed 11/05/02, which revealed nonspecific rare subtle white matter hyperintensities in the subcortical white matter. She underwent a lumbar puncture on 11/19/02. She had an opening pressure of 23 cm H2O. CSF did show oligoclonal bands   MRI of the brain with and without contrast performed 12/26/06 revealed multiple hyperintensities in the subcortical white matter with no abnormal enhancement.  cerebellar tonsils were 4-5 mm below the foramen magnum.   MRI of the brain with and without contrast performed 12/24/12 revealed again nonspecific periventricular, subcortical and juxtacortical hyperintensities with no abnormal enhancement. .   She had a follow-up MRI of the brain with and without contrast performed on 08/20/13, which revealed stable non-enhancing non-specific white matter hyperintensities when compared to prior study from 12/22/13.  She was complaining of increased pain and tingling involving the left arm and hand, so MRI of the cervical spine was performed on 12/18/13, which revealed disc protrusion affecting the left C6 nerve root. She underwent anterior cervical discectomy and fusion on 03/01/14 performed by Dr. Earnie Larsson. Plan I have severe B12 deficiency  Past Medical History  Diagnosis Date  . Hypertension   . MS (multiple sclerosis) ([place])   . Colon cancer ([place])   . Back pain   .  Umbilical hernia   . Other hammer toe (acquired) 03/31/2013  . Pneumonia     hx of  . GERD (gastroesophageal reflux disease)   . Diarrhea   . Arthritis   . Insomnia    Past Surgical History  Procedure Laterality Date  . Colon surgery    . Cesarean section    . Foot  surgery Right   . Abdominal hysterectomy    . Colonoscopy    . Gastric bypass    . Anterior cervical decomp/discectomy fusion N/A 03/01/2014    Procedure: ANTERIOR CERVICAL DECOMPRESSION/DISCECTOMY FUSION 1 LEVEL;  Surgeon: Charlie Pitter, MD;  Location: [facility];  Service: Neurosurgery;  Laterality: N/A;  ANTERIOR CERVICAL DECOMPRESSION/DISCECTOMY FUSION 1 LEVEL  . Bunionectomy Right 10/14/2014    @[facility]   . Hammer toe repair Right 10/14/2014    RT #2, @[facility]   . Tenotomy Right 10/14/2014    RT #3, @[facility]    Social History   Social History  . Marital Status: Single    Spouse Name: N/A  . Number of Children: N/A  . Years of Education: N/A   Social History Main Topics  . Smoking status: Current Some Day Smoker -- 0.25 packs/day for 20 years    Types: Cigarettes  . Smokeless tobacco: Never Used  . Alcohol Use: 0.0 oz/week    0 Standard drinks or equivalent per week     Comment: 1/2 pint daily  . Drug Use: No  . Sexual Activity:    Partners: Male   Other Topics Concern  . None   Social History Narrative   Allergies  Allergen Reactions  . Phenergan [Promethazine Hcl] Anxiety  . Promethazine Anxiety   Family History  Problem Relation Age of Onset  . Stroke Mother   . Hypertension Mother   . Hyperlipidemia Mother   . Diabetes Mother   . Diabetes Brother   . Hypertension Brother   . Hypertension Brother   . Hypertension Brother   . Hypertension Brother   . Hypertension Brother   . Alcoholism Brother     Past medical history, social, surgical and family history all reviewed in electronic medical record.  No pertanent information unless stated regarding to the chief complaint.   Review of Systems: No headache, visual changes, nausea, vomiting, diarrhea, constipation, dizziness, abdominal pain, skin rash, fevers, chills, night sweats, weight loss, swollen lymph nodes, body aches, joint swelling, muscle aches, chest pain, shortness of breath, mood changes.   Objective Blood  pressure 134/84, pulse 120, height 5\' 6"  (1.676 m), weight 227 lb (102.967 kg), SpO2 99 %.  General: No apparent distress alert and oriented x3 mood and affect normal, dressed appropriately.  HEENT: Pupils equal, extraocular movements intact  Respiratory: Patient's speak in full sentences and does not appear short of breath  Cardiovascular: No lower extremity edema, non tender, no erythema  Skin: Warm dry intact with no signs of infection or rash on extremities or on axial skeleton.  Abdomen: Soft nontender  Neuro: Cranial nerves II through XII are intact, neurovascularly intact in all extremities with 1+ DTRs but symmetric and 2+ pulses.  Lymph: No lymphadenopathy of posterior or anterior cervical chain or axillae bilaterally.  Gait mild antalgic gait  MSK:  Non tender with full range of motion and good stability and symmetric strength and tone of shoulders, elbows, wrist, hip, knee and ankles bilaterally.  Neck: Inspection unremarkable. Incision well-healed No palpable stepoffs. Positive Spurling's maneuver but seems to be bilateral. Limited in range of motion lacking  the last 5 of flexion and the last 10 of extension Grip strength is 4 out of 5 strength on the left side compared to 5 out of 5 strength on the right side. Patient does have mild atrophy of the thenar eminence bilaterally Strength good C4 to T1 distribution when tested individually No sensory change to C4 to T1 Negative Hoffman sign bilaterally Reflexes normal Mild positive Romberg   Impression and Recommendations:     This case required medical decision making of moderate complexity.      Note: This dictation was prepared with Dragon dictation along with smaller phrase technology. Any transcriptional errors that result from this process are unintentional.

## 2015-04-12 NOTE — Assessment & Plan Note (Signed)
Patient does have some weakness of the C8 distribution with grip strength on the left side compared to contralateral side. Possible repeat imaging may be necessary. I do believe that with patient's other symptoms this is more concerning for more of a systemic illness. We'll see how patient does response to the prednisone. Otherwise further workup may be necessary.

## 2015-04-12 NOTE — Assessment & Plan Note (Signed)
Trips and given for every two-week B12 injections with the low level. Discussed vitamin B6 absorption as well. Patient will come back and see me in 2-3 weeks to make sure she is responding.

## 2015-04-12 NOTE — Assessment & Plan Note (Signed)
Patient's physical exam as well as imaging and history is somewhat concerning for multiple sclerosis or another demyelinating problem. Patient is seen specialist would it appears the patient has some confusion on diagnosis at this point. I do not have the notes from the [facility] to the figure out what is exactly going on. Patient does have some weakness on this left side. Differential includes the B12 deficiency, cervical spondylosis with radiculopathy and worsening of adjacent segment disease. This would be more in the C7 C8 distribution. Her possibly flare of her underlying condition. Patient is having systemic findings and make me more concerned for a systemic illness. We will send a note to patient's primary neurologist here to see if he has any other recommendations. At this moment we will treat with high-dose vitamin D, prednisone daily for the next 10 days to decrease any inflammation that would be helping to correspond to the neck or to the multiple sclerosis. Patient states that she was doing much better when she was being treated for the multiple sclerosis and is wondering if that was a possibility and we discussed that I do not feel comfortable with those medications and she should discuss this with her multiple sclerosis specialist. Depending on how patient response I do not want her to fall through the cracks or would like her to follow-up in 2 weeks to make sure she is getting proper follow-up with the other individuals. I do believe that this will be likely out of my hands though in the long run.

## 2015-04-12 NOTE — Assessment & Plan Note (Signed)
Prescription sent in today for once weekly. Likely not absorbent secondary to her bariatric surgery as well as history of colon cancer.

## 2015-04-12 NOTE — Patient Instructions (Addendum)
Good to see you We will get you on B12 injections monthly  Take B6 200mg  daily I am concerned with the findings today and will send Dr. Susa Raring a note to ask his preference of seeing you again or if we need [facility] to help Prednisone 40 mg daily for next 10 days.  I am sorry I do not have more for you but if worsening symptoms such as headache, dizziness, worse balance or weakness please go to the emergency room

## 2015-04-13 ENCOUNTER — Telehealth: Payer: Self-pay

## 2015-04-13 NOTE — Telephone Encounter (Signed)
Called patient. Left vm to return call.  We were contacted by Dr. Tamala Julian, who let us know that patient had been discharged from [facility] after transferring care to them based on a referral from Dr. Tomi Likens.  Dr. Tomi Likens was unaware that patient had been discharged from [facility] to see if patient would like to come in to be seen by Dr. Tomi Likens. If so, she will need to have an Ophthalmology appointment before being scheduled as a new patient. (45 minutes)

## 2015-04-14 ENCOUNTER — Ambulatory Visit: Payer: Medicare Other

## 2015-04-15 ENCOUNTER — Encounter: Payer: Self-pay | Admitting: Internal Medicine

## 2015-04-15 ENCOUNTER — Ambulatory Visit (INDEPENDENT_AMBULATORY_CARE_PROVIDER_SITE_OTHER): Payer: Medicare Other | Admitting: Internal Medicine

## 2015-04-15 VITALS — BP 124/76 | HR 101 | Temp 98.0°F | Resp 16 | Wt 227.0 lb

## 2015-04-15 DIAGNOSIS — G35 Multiple sclerosis: Secondary | ICD-10-CM | POA: Diagnosis not present

## 2015-04-15 DIAGNOSIS — E538 Deficiency of other specified B group vitamins: Secondary | ICD-10-CM | POA: Diagnosis not present

## 2015-04-15 DIAGNOSIS — M62838 Other muscle spasm: Secondary | ICD-10-CM | POA: Diagnosis not present

## 2015-04-15 DIAGNOSIS — I10 Essential (primary) hypertension: Secondary | ICD-10-CM | POA: Diagnosis not present

## 2015-04-15 MED ORDER — CYANOCOBALAMIN 1000 MCG/ML IJ SOLN
1000.0000 ug | Freq: Once | INTRAMUSCULAR | Status: AC
  Administered 2015-04-15: 1000 ug via INTRAMUSCULAR

## 2015-04-15 MED ORDER — TIZANIDINE HCL 2 MG PO TABS: 2.0000 mg | ORAL_TABLET | Freq: Three times a day (TID) | ORAL | Status: DC | PRN

## 2015-04-15 NOTE — Progress Notes (Signed)
Pre visit review using our clinic review tool, if applicable. No additional management support is needed unless otherwise documented below in the visit note. 

## 2015-04-15 NOTE — Progress Notes (Signed)
Subjective:    Patient ID: Misty [name], female    DOB: [birth year], 55 y.o.   MRN: [id number]  HPI She is here for follow up of her blood pressure, which was elevated at her last visit.    Muscle spasms and sharp pain in muscles: she continues to have muscle spasms and sharp pain in her muscles throughout her body.  She has these symptoms daily, multiple times a day.  The spasms can be severe at times and her hand or arm is not able to be used when she has the spasm.  She has fallen in the past for no reason.  Her symptoms are inhibiting her at school and she needs paperwork filled out for school.  She has one semester left and she just wants to finish school.  She was told by a neurologist in the past that she had multiple sclerosis and was on medication - copaxone.  When she saw a different neurologist she was told that she did not have MS and was taken off the copaxone.  When the medication was stopped these symptoms started.  She will follow up with Dr. Tomi Likens but she needs to have a eye exam first.   Hypertension: She is taking her medication daily. She is compliant with a low sodium diet.  She denies chest pain, palpitations, edema, shortness of breath and regular headaches. She is not exercising regularly.  She does not monitor her blood pressure at home.    B12 deficiency:  Her B12 level is very low.  She did have one injection already and wants to continue the injections.    Medications and allergies reviewed with patient and updated if appropriate.  Patient Active Problem List   Diagnosis Date Noted  . B12 deficiency 03/30/2015  . Hyperglycemia 03/30/2015  . Hammer toe of left foot 02/16/2015  . Fibrosis of skin of lower extremity 02/16/2015  . Status post right foot surgery 10/26/2014  . Bunion 09/24/2014  . Cervical spondylosis with radiculopathy 03/01/2014  . Hammer toe of right foot 01/26/2014  . Pain in lower limb 05/22/2013  . Other hammer toe (acquired) 03/31/2013    . Porokeratosis 03/31/2013  . Pain, foot 03/31/2013  . Vitamin D deficiency 04/18/2010  . FATIGUE 04/14/2010  . BACK PAIN WITH RADICULOPATHY 11/29/2008  . MEDIAL MENISCUS TEAR, LEFT 06/30/2008  . BARRETTS ESOPHAGUS 06/28/2008  . OSTEOARTHRITIS, KNEES, BILATERAL 06/28/2008  . OBESITY 05/24/2008  . HYPERTENSION, BENIGN ESSENTIAL 05/24/2008  . IRRITABLE BOWEL SYNDROME 05/24/2008  . BARIATRIC SURGERY STATUS 03/19/2006  . MULTIPLE SCLEROSIS 03/19/2002  . NEOPLASM, MALIGNANT, COLON, HX OF 03/19/1997    Current Outpatient Prescriptions on File Prior to Visit  Medication Sig Dispense Refill  . aspirin 81 MG tablet Take 81 mg by mouth daily.    . Cyanocobalamin (B-12) 1000 MCG/ML KIT Inject 1,000 mcg as directed every 30 (thirty) days. 1 kit 6  . cyclobenzaprine (FLEXERIL) 10 MG tablet Take 1 tablet (10 mg total) by mouth 3 (three) times daily as needed for muscle spasms. 30 tablet 0  . hydrochlorothiazide (HYDRODIURIL) 25 MG tablet Take 0.5 tablets (12.5 mg total) by mouth daily. PATIENT NEEDS OFFICE VISIT FOR ADDITIONAL REFILLS 15 tablet 0  . lisinopril (PRINIVIL,ZESTRIL) 20 MG tablet Take 1 tablet (20 mg total) by mouth daily. PATIENT NEEDS OFFICE VISIT FOR ADDITIONAL REFILLS 30 tablet 0  . predniSONE (DELTASONE) 20 MG tablet Take 2 tablets (40 mg total) by mouth daily with breakfast. 20 tablet 0  .  traMADol (ULTRAM) 50 MG tablet TAKE ONE TABLET BY MOUTH EVERY 8 HOURS AS NEEDED 30 tablet 0  . Vitamin D, Ergocalciferol, (DRISDOL) 50000 units CAPS capsule Take 1 capsule (50,000 Units total) by mouth every 7 (seven) days. Sunday 12 capsule 1   No current facility-administered medications on file prior to visit.    Past Medical History  Diagnosis Date  . Hypertension   . MS (multiple sclerosis) ([place])   . Colon cancer ([place])   . Back pain   . Umbilical hernia   . Other hammer toe (acquired) 03/31/2013  . Pneumonia     hx of  . GERD (gastroesophageal reflux disease)   . Diarrhea   .  Arthritis   . Insomnia     Past Surgical History  Procedure Laterality Date  . Colon surgery    . Cesarean section    . Foot surgery Right   . Abdominal hysterectomy    . Colonoscopy    . Gastric bypass    . Anterior cervical decomp/discectomy fusion N/A 03/01/2014    Procedure: ANTERIOR CERVICAL DECOMPRESSION/DISCECTOMY FUSION 1 LEVEL;  Surgeon: Charlie Pitter, MD;  Location: [facility];  Service: Neurosurgery;  Laterality: N/A;  ANTERIOR CERVICAL DECOMPRESSION/DISCECTOMY FUSION 1 LEVEL  . Bunionectomy Right 10/14/2014    '@[facility]'$   . Hammer toe repair Right 10/14/2014    RT #2, '@[facility]'$   . Tenotomy Right 10/14/2014    RT #3, '@[facility]'$     Social History   Social History  . Marital Status: Single    Spouse Name: N/A  . Number of Children: N/A  . Years of Education: N/A   Social History Main Topics  . Smoking status: Current Some Day Smoker -- 0.25 packs/day for 20 years    Types: Cigarettes  . Smokeless tobacco: Never Used  . Alcohol Use: 0.0 oz/week    0 Standard drinks or equivalent per week     Comment: 1/2 pint daily  . Drug Use: No  . Sexual Activity:    Partners: Male   Other Topics Concern  . None   Social History Narrative    Family History  Problem Relation Age of Onset  . Stroke Mother   . Hypertension Mother   . Hyperlipidemia Mother   . Diabetes Mother   . Diabetes Brother   . Hypertension Brother   . Hypertension Brother   . Hypertension Brother   . Hypertension Brother   . Hypertension Brother   . Alcoholism Brother     Review of Systems  Constitutional: Positive for fatigue. Negative for fever and chills.  Respiratory: Negative for cough, shortness of breath and wheezing.   Cardiovascular: Negative for chest pain, palpitations and leg swelling.  Musculoskeletal: Positive for gait problem.       Muscle spasms, sharp periodic pain in muscles daily  Neurological: Positive for dizziness and numbness (left hand, mild in right hand). Negative for headaches.        Objective:   Filed Vitals:   04/15/15 1445  BP: 124/76  Pulse: 101  Temp: 98 F (36.7 C)  Resp: 16   Filed Weights   04/15/15 1445  Weight: 227 lb (102.967 kg)   Body mass index is 36.66 kg/(m^2).   Physical Exam Constitutional: Appears well-developed and well-nourished. No distress.  Neck: Neck supple. No tracheal deviation present. No thyromegaly present.  No carotid bruit. No cervical adenopathy.   Cardiovascular: Normal rate, regular rhythm and normal heart sounds.   No murmur heard.  No  edema Pulmonary/Chest: Effort normal and breath sounds normal. No respiratory distress. No wheezes.       Assessment & Plan:   See Problem List for Assessment and Plan of chronic medical problems.  Follow up in 3 months, sooner if needed

## 2015-04-15 NOTE — Patient Instructions (Addendum)
Vitamin B12 injection today, daily all of next week, once a week after that for one month and then monthly  All other Health Maintenance issues reviewed.   All recommended immunizations and age-appropriate screenings are up-to-date.  Medications reviewed and updated.  Discontinue flexeril.  Start baclofen.    Your prescription(s) have been submitted to your pharmacy. Please take as directed and contact our office if you believe you are having problem(s) with the medication(s).   Please schedule followup in 3 months.

## 2015-04-16 DIAGNOSIS — M62838 Other muscle spasm: Secondary | ICD-10-CM | POA: Insufficient documentation

## 2015-04-16 DIAGNOSIS — M62830 Muscle spasm of back: Secondary | ICD-10-CM | POA: Insufficient documentation

## 2015-04-16 NOTE — Assessment & Plan Note (Signed)
BP controlled here today Continue current medications

## 2015-04-16 NOTE — Assessment & Plan Note (Signed)
Concern for possible symptom of multiple sclerosis, which she has been diagnosed with in the past, but was later told she did not have it Will see neuro - referred by Dr. Tamala Julian Needs eye exam first - I have referred her to Ophtho Flexeril is not helping Will try tizanidine

## 2015-04-16 NOTE — Assessment & Plan Note (Signed)
Given extremely low level of B12 will give injections daily for one week, then weekly for one month and then monthly Will recheck B12 level in a couple of months

## 2015-04-18 ENCOUNTER — Ambulatory Visit (INDEPENDENT_AMBULATORY_CARE_PROVIDER_SITE_OTHER): Payer: Medicare Other

## 2015-04-18 DIAGNOSIS — E538 Deficiency of other specified B group vitamins: Secondary | ICD-10-CM

## 2015-04-18 MED ORDER — CYANOCOBALAMIN 1000 MCG/ML IJ SOLN
1000.0000 ug | Freq: Once | INTRAMUSCULAR | Status: AC
  Administered 2015-04-18: 1000 ug via INTRAMUSCULAR

## 2015-04-19 ENCOUNTER — Telehealth: Payer: Self-pay

## 2015-04-19 ENCOUNTER — Ambulatory Visit (INDEPENDENT_AMBULATORY_CARE_PROVIDER_SITE_OTHER): Payer: Medicare Other | Admitting: General Practice

## 2015-04-19 DIAGNOSIS — E539 Vitamin B deficiency, unspecified: Secondary | ICD-10-CM

## 2015-04-19 MED ORDER — CYANOCOBALAMIN 1000 MCG/ML IJ SOLN
1000.0000 ug | Freq: Once | INTRAMUSCULAR | Status: AC
  Administered 2015-04-19: 1000 ug via INTRAMUSCULAR

## 2015-04-19 NOTE — Telephone Encounter (Signed)
Coming over today for injection; Called to scheduled AWV; LVM to schedule or call back

## 2015-04-20 ENCOUNTER — Ambulatory Visit (INDEPENDENT_AMBULATORY_CARE_PROVIDER_SITE_OTHER): Payer: Medicare Other

## 2015-04-20 DIAGNOSIS — E538 Deficiency of other specified B group vitamins: Secondary | ICD-10-CM

## 2015-04-20 MED ORDER — CYANOCOBALAMIN 1000 MCG/ML IJ SOLN
1000.0000 ug | Freq: Once | INTRAMUSCULAR | Status: AC
  Administered 2015-04-20: 1000 ug via INTRAMUSCULAR

## 2015-04-21 ENCOUNTER — Ambulatory Visit (INDEPENDENT_AMBULATORY_CARE_PROVIDER_SITE_OTHER): Payer: Medicare Other | Admitting: General Practice

## 2015-04-21 DIAGNOSIS — E539 Vitamin B deficiency, unspecified: Secondary | ICD-10-CM | POA: Diagnosis not present

## 2015-04-21 MED ORDER — CYANOCOBALAMIN 1000 MCG/ML IJ SOLN
1000.0000 ug | Freq: Once | INTRAMUSCULAR | Status: AC
  Administered 2015-04-21: 1000 ug via INTRAMUSCULAR

## 2015-04-22 ENCOUNTER — Telehealth: Payer: Self-pay | Admitting: Internal Medicine

## 2015-04-22 ENCOUNTER — Ambulatory Visit (INDEPENDENT_AMBULATORY_CARE_PROVIDER_SITE_OTHER): Payer: Medicare Other

## 2015-04-22 DIAGNOSIS — E538 Deficiency of other specified B group vitamins: Secondary | ICD-10-CM

## 2015-04-22 MED ORDER — CYANOCOBALAMIN 1000 MCG/ML IJ SOLN
1000.0000 ug | Freq: Once | INTRAMUSCULAR | Status: AC
  Administered 2015-04-22: 1000 ug via INTRAMUSCULAR

## 2015-04-22 NOTE — Telephone Encounter (Signed)
Pt stated she saw Dr. Tamala Julian and after that visit, Dr. Quay Burow said pt would need a test (?) before she could go back to see the neurologist.  Please advise.

## 2015-04-25 NOTE — Telephone Encounter (Signed)
Yes I did discuss this with her - she needs an eye exam - referral ordered

## 2015-04-25 NOTE — Telephone Encounter (Signed)
I see a referral for Ophthalmology. Would this be the test she is thinking about? Please advise

## 2015-04-27 ENCOUNTER — Ambulatory Visit (INDEPENDENT_AMBULATORY_CARE_PROVIDER_SITE_OTHER): Payer: Medicare Other | Admitting: Family Medicine

## 2015-04-27 ENCOUNTER — Encounter: Payer: Self-pay | Admitting: Family Medicine

## 2015-04-27 VITALS — BP 122/82 | HR 71 | Wt 230.0 lb

## 2015-04-27 DIAGNOSIS — E538 Deficiency of other specified B group vitamins: Secondary | ICD-10-CM

## 2015-04-27 DIAGNOSIS — R29898 Other symptoms and signs involving the musculoskeletal system: Secondary | ICD-10-CM | POA: Diagnosis not present

## 2015-04-27 DIAGNOSIS — G35 Multiple sclerosis: Secondary | ICD-10-CM | POA: Diagnosis not present

## 2015-04-27 DIAGNOSIS — Z9889 Other specified postprocedural states: Secondary | ICD-10-CM

## 2015-04-27 DIAGNOSIS — L84 Corns and callosities: Secondary | ICD-10-CM

## 2015-04-27 MED ORDER — CYANOCOBALAMIN 1000 MCG/ML IJ SOLN
1000.0000 ug | Freq: Once | INTRAMUSCULAR | Status: AC
  Administered 2015-04-27: 1000 ug via INTRAMUSCULAR

## 2015-04-27 NOTE — Progress Notes (Signed)
[name] Misty [name], [place] [address] Phone: [phone] Subjective:    I'm seeing this patient by the request  of:  [name], MD   CC: Neck pain  [id number] Misty [name] is a 55 y.o. female coming in with complaint of neck pain. Patient past medical history is significant for multiple sclerosis for patient states that even that is in consideration is a true diagnosis at this moment. Patient also had history of cervical neck arthritis status post C5-C6 fusion. Patient states though that her neck pain seems to be worsening over the course of time. Patient states this seems to be stabilizing at this time with the pain but continues to have the same as muscle spasms. Has not been able to seen neurology and is awaiting a ophthalmology appointment. Does feel that the medication that she was on for 8 years seemed to be the most beneficial. Patient is wondering if she will back on the medication. Then patient was on the prednisone she did notice some mild improvement. But it was very short lived.  Continues to have a burn full painful paresthesia of the arms left greater than right. Continues to have the cramping. Patient is doing the B12 but has not notice any significant improvement.    Patient is having more spasms in her right foot. Patient did have surgery on her right foot for bunionectomy. Has had 2 different surgeries on it. Since the surgery she has noticed more numbness in the foot. Patient states that now she is having more of a callus formation on the bottom of the foot that is severe pain. Patient is wearing a postop boot. Has seen the podiatrist who stated that there is nothing wrong with the surgery. Patient states that she told her not to come back.  Reviewed patient's chart Onset: 2004. Marland Kitchen She saw Dr. Jacolyn Reedy at [facility] who performed MRI of the brain with and without contrast, performed 11/05/02, which revealed nonspecific rare  subtle white matter hyperintensities in the subcortical white matter. She underwent a lumbar puncture on 11/19/02. She had an opening pressure of 23 cm H2O. CSF did show oligoclonal bands   MRI of the brain with and without contrast performed 12/26/06 revealed multiple hyperintensities in the subcortical white matter with no abnormal enhancement.  cerebellar tonsils were 4-5 mm below the foramen magnum.   MRI of the brain with and without contrast performed 12/24/12 revealed again nonspecific periventricular, subcortical and juxtacortical hyperintensities with no abnormal enhancement. .   She had a follow-up MRI of the brain with and without contrast performed on 08/20/13, which revealed stable non-enhancing non-specific white matter hyperintensities when compared to prior study from 12/22/13.  She was complaining of increased pain and tingling involving the left arm and hand, so MRI of the cervical spine was performed on 12/18/13, which revealed disc protrusion affecting the left C6 nerve root. She underwent anterior cervical discectomy and fusion on 03/01/14 performed by Dr. Earnie Larsson.   Past Medical History  Diagnosis Date  . Hypertension   . MS (multiple sclerosis) ([place])   . Colon cancer ([place])   . Back pain   . Umbilical hernia   . Other hammer toe (acquired) 03/31/2013  . Pneumonia     hx of  . GERD (gastroesophageal reflux disease)   . Diarrhea   . Arthritis   . Insomnia    Past Surgical History  Procedure Laterality Date  . Colon surgery    .  Cesarean section    . Foot surgery Right   . Abdominal hysterectomy    . Colonoscopy    . Gastric bypass    . Anterior cervical decomp/discectomy fusion N/A 03/01/2014    Procedure: ANTERIOR CERVICAL DECOMPRESSION/DISCECTOMY FUSION 1 LEVEL;  Surgeon: Charlie Pitter, MD;  Location: [facility];  Service: Neurosurgery;  Laterality: N/A;  ANTERIOR CERVICAL DECOMPRESSION/DISCECTOMY FUSION 1 LEVEL  . Bunionectomy Right 10/14/2014    @[facility]     . Hammer toe repair Right 10/14/2014    RT #2, @[facility]   . Tenotomy Right 10/14/2014    RT #3, @[facility]    Social History   Social History  . Marital Status: Single    Spouse Name: N/A  . Number of Children: N/A  . Years of Education: N/A   Social History Main Topics  . Smoking status: Current Some Day Smoker -- 0.25 packs/day for 20 years    Types: Cigarettes  . Smokeless tobacco: Never Used  . Alcohol Use: 0.0 oz/week    0 Standard drinks or equivalent per week     Comment: 1/2 pint daily  . Drug Use: No  . Sexual Activity:    Partners: Male   Other Topics Concern  . None   Social History Narrative   Allergies  Allergen Reactions  . Phenergan [Promethazine Hcl] Anxiety  . Promethazine Anxiety   Family History  Problem Relation Age of Onset  . Stroke Mother   . Hypertension Mother   . Hyperlipidemia Mother   . Diabetes Mother   . Diabetes Brother   . Hypertension Brother   . Hypertension Brother   . Hypertension Brother   . Hypertension Brother   . Hypertension Brother   . Alcoholism Brother     Past medical history, social, surgical and family history all reviewed in electronic medical record.  No pertanent information unless stated regarding to the chief complaint.   Review of Systems: No headache, visual changes, nausea, vomiting, diarrhea, constipation, dizziness, abdominal pain, skin rash, fevers, chills, night sweats, weight loss, swollen lymph nodes, body aches, joint swelling, muscle aches, chest pain, shortness of breath, mood changes.   Objective Blood pressure 122/82, pulse 71, weight 230 lb (104.327 kg), SpO2 93 %.  General: No apparent distress alert and oriented x3 mood and affect normal, dressed appropriately.  HEENT: Pupils equal, extraocular movements intact  Respiratory: Patient's speak in full sentences and does not appear short of breath  Cardiovascular: No lower extremity edema, non tender, no erythema  Skin: Warm dry intact with no signs of  infection or rash on extremities or on axial skeleton.  Abdomen: Soft nontender  Neuro: Cranial nerves II through XII are intact, neurovascularly intact in all extremities with 1+ DTRs but symmetric and 2+ pulses.  Lymph: No lymphadenopathy of posterior or anterior cervical chain or axillae bilaterally.  Gait severe antalgic gait  MSK:  Non tender with full range of motion and good stability and symmetric strength and tone of shoulders, elbows, wrist, hip, knee and ankles bilaterally.  Neck: Inspection unremarkable. Incision well-healed No palpable stepoffs. Positive Spurling's maneuver but seems to be bilateral. Limited in range of motion lacking the last 5 of flexion and the last 10 of extension Grip strength is 4 out of 5 strength on the left side compared to 5 out of 5 strength on the right side. Patient does have mild atrophy of the thenar eminence bilaterally Strength good C4 to T1 distribution when tested individually No sensory change to C4 to  T1 Negative Hoffman sign bilaterally Reflexes normal Mild positive Romberg  Right foot exam shows the patient does have severe breakdown of the longitudinal and transverse arch. Patient has significant callus formation between the second and third toes on the plantar aspect. Very large. Patient also has a callus formation on the fifth toe. Patient has significant numbness of this foot.  Procedure note After verbal consent patient did have a 10 blade taken to the plantar aspect of the foot and did have a shave down of the callus formation between the second and third toes as well as on the fifth toe. Patient tolerated the procedure well. Liquid nitrogen placed. No blood loss.   Impression and Recommendations:     This case required medical decision making of moderate complexity.      Note: This dictation was prepared with Dragon dictation along with smaller phrase technology. Any transcriptional errors that result from this process are  unintentional.

## 2015-04-27 NOTE — Assessment & Plan Note (Signed)
Patient did have callus at foot removed today. Patient though does have significant neuropathy of the foot. EMG is ordered today for further evaluation. This could be postsurgical with patient stating that that is when it seemed to start to occur. Patient though does have some numbness in her other extremities and this could be secondary to the demyelinating aspect and patient has had a history radiculopathy of her back. Differential is quite broad at this time. EMG will help [name] further evaluate. Patient also referred to a another foot specialist.

## 2015-04-27 NOTE — Patient Instructions (Signed)
I am sorry about your foot.  We will get an EMG to see if this is from the surgery or if itis from the B12 deficiency  I do need you to see neurology and we will check on the referral for you  Get Dr. Lucky Rathke wart removal pads and put on the bottom of the foot for the next week Once you are in a shoe again then get [employer] orthotics "total support" online would be great  I would discuss again with your surgeon. Lets have you see another foot doctor to see if they have anything else to help Sorry for not having the answer for you.

## 2015-04-27 NOTE — Assessment & Plan Note (Signed)
I do believe the patient does have a demyelinating disease. Continues to have more of the spasms. We discussed other treatment options. I do believe that neurology will be the most beneficial. We did get patient in with an ophthalmologist on the 10th. Hopefully she will have a appointment soon thereafter with neurology for further evaluation and treatment.

## 2015-04-27 NOTE — Assessment & Plan Note (Signed)
Continues to have severe amount of pain. Continues to have numbness that is severe at this time. Worse than previous exam. Refer to another podiatrist for second opinion.

## 2015-04-27 NOTE — Assessment & Plan Note (Signed)
Given an injection today. Patient is following up with [facility] care provider for this. Seems to be on a good regimen.

## 2015-04-28 ENCOUNTER — Telehealth: Payer: Self-pay

## 2015-04-28 NOTE — Addendum Note (Signed)
Addended by: Douglass Rivers T on: 04/28/2015 02:36 PM   Modules accepted: Orders

## 2015-04-28 NOTE — Telephone Encounter (Signed)
Patient is to see Dr. Kathlen Mody tomorrow at [facility]. Will have them forward notes to [name]. Is scheduled to come see [name] on Feb. 24 @ 1.

## 2015-04-28 NOTE — Telephone Encounter (Signed)
-----   Message from Pieter Partridge, DO sent at 04/28/2015  7:01 AM EST ----- Corey Skains, can we get this patient scheduled to see me ASAP when I return from vacation.  She has an ophthalmology appointment tomorrow (2/10).  She must keep that appointment prior to seeing me (that is mandatory).  Find out which eye doctor she is seeing.

## 2015-04-29 ENCOUNTER — Ambulatory Visit: Payer: Medicare Other

## 2015-04-29 DIAGNOSIS — H25013 Cortical age-related cataract, bilateral: Secondary | ICD-10-CM | POA: Diagnosis not present

## 2015-04-29 DIAGNOSIS — Z79899 Other long term (current) drug therapy: Secondary | ICD-10-CM | POA: Diagnosis not present

## 2015-04-29 DIAGNOSIS — H3589 Other specified retinal disorders: Secondary | ICD-10-CM | POA: Diagnosis not present

## 2015-04-29 DIAGNOSIS — H2513 Age-related nuclear cataract, bilateral: Secondary | ICD-10-CM | POA: Diagnosis not present

## 2015-05-05 ENCOUNTER — Encounter: Payer: Self-pay | Admitting: Family Medicine

## 2015-05-05 ENCOUNTER — Ambulatory Visit (INDEPENDENT_AMBULATORY_CARE_PROVIDER_SITE_OTHER): Payer: Medicare Other | Admitting: Family Medicine

## 2015-05-05 ENCOUNTER — Telehealth: Payer: Self-pay | Admitting: Internal Medicine

## 2015-05-05 VITALS — BP 118/70 | HR 101 | Temp 98.1°F | Wt 229.0 lb

## 2015-05-05 DIAGNOSIS — E538 Deficiency of other specified B group vitamins: Secondary | ICD-10-CM | POA: Diagnosis not present

## 2015-05-05 DIAGNOSIS — M549 Dorsalgia, unspecified: Secondary | ICD-10-CM | POA: Diagnosis not present

## 2015-05-05 DIAGNOSIS — M545 Low back pain, unspecified: Secondary | ICD-10-CM

## 2015-05-05 MED ORDER — PREDNISONE 20 MG PO TABS: ORAL_TABLET | ORAL | Status: DC

## 2015-05-05 MED ORDER — CYANOCOBALAMIN 1000 MCG/ML IJ SOLN
1000.0000 ug | Freq: Once | INTRAMUSCULAR | Status: AC
  Administered 2015-05-05: 1000 ug via INTRAMUSCULAR

## 2015-05-05 MED ORDER — CYCLOBENZAPRINE HCL 10 MG PO TABS: 10.0000 mg | ORAL_TABLET | Freq: Three times a day (TID) | ORAL | Status: DC | PRN

## 2015-05-05 MED ORDER — KETOROLAC TROMETHAMINE 60 MG/2ML IM SOLN
30.0000 mg | Freq: Once | INTRAMUSCULAR | Status: AC
  Administered 2015-05-05: 30 mg via INTRAMUSCULAR

## 2015-05-05 NOTE — Progress Notes (Signed)
Garret Reddish, MD  Subjective:  Misty [name] is a 55 y.o. year old very pleasant female patient who presents for/with See problem oriented charting ROS- no fecal or urinary incontinence, denies leg weakness, no saddle anesthesia. No HIV or IV drug use history  Past Medical History-  Hypertension, vitamin D deficiency, ? MS  Medications- reviewed and updated Current Outpatient Prescriptions  Medication Sig Dispense Refill  . aspirin 81 MG tablet Take 81 mg by mouth daily.    . Cyanocobalamin (B-12) 1000 MCG/ML KIT Inject 1,000 mcg as directed every 30 (thirty) days. 1 kit 6  . hydrochlorothiazide (HYDRODIURIL) 25 MG tablet Take 0.5 tablets (12.5 mg total) by mouth daily. PATIENT NEEDS OFFICE VISIT FOR ADDITIONAL REFILLS 15 tablet 0  . lisinopril (PRINIVIL,ZESTRIL) 20 MG tablet Take 1 tablet (20 mg total) by mouth daily. PATIENT NEEDS OFFICE VISIT FOR ADDITIONAL REFILLS 30 tablet 0  . Vitamin D, Ergocalciferol, (DRISDOL) 50000 units CAPS capsule Take 1 capsule (50,000 Units total) by mouth every 7 (seven) days. Sunday 12 capsule 1  . cyclobenzaprine (FLEXERIL) 10 MG tablet Take 1 tablet (10 mg total) by mouth 3 (three) times daily as needed for muscle spasms (do not take any day that you take tizanidine). 30 tablet 0  . predniSONE (DELTASONE) 20 MG tablet Take 2 pills for 3 days, 1 pill for 4 days 10 tablet 0  . tiZANidine (ZANAFLEX) 2 MG tablet Take 1 tablet (2 mg total) by mouth every 8 (eight) hours as needed for muscle spasms. (Patient not taking: Reported on 05/05/2015) 90 tablet 3   Objective: BP 118/70 mmHg  Pulse 101  Temp(Src) 98.1 F (36.7 C)  Wt 229 lb (103.874 kg) Gen: NAD, resting comfortably CV: RRR no murmurs rubs or gallops Lungs: CTAB no crackles, wheeze, rhonchi Abdomen: soft/nontender/nondistended/normal bowel sounds. No rebound or guarding.  Ext: no edema Skin: warm, dry  Back - Normal skin, Spine with normal alignment and no deformity.  No tenderness to  vertebral process palpation.  Paraspinous muscles very tender on right and with obvious spasm.   Range of motion is full at neck. Patient avoids flexion of lumbar spine due to pain.  Prefers to stand Neuro- no saddle anesthesia, 5/5 strength lower extremities, 2+ reflexes  Assessment/Plan:  Severe Right low back pain radiating to right side (established issue, worsening) S: yesterday started with Spasms in her right low back. . Stabbing pain increasing in intensity. Similar to spasms she has had in other areas especially since stopping MS treatment. Usually spasms in foot or arm. In December 2016 had something similar but in her neck and then a few days later went down into her low back. Received injection and seemed to go away within 2-3 days. Review or chart shows received toradol. Since that time has had visits with PCP as well as Charlann Boxer of sports medicine. Plan was for neurology referral due to potential MS which she has upcoming within 10 days. She had reported flexeril was not helping with spasms so switched to zanaflex but patient now reports flexeril actually helped more. With 1 of her episodes received prednisone which may have helped some but minimally. She has a history of potential MS but later was told did not have. She has Cervical arthritis with history c5-c6 fusion. No radicular symptoms A/P: Patient with right low back muscle spasm of unclear etiology. Could be arthritis or particularly facet joint arthritis related. Trial toradol 79m as helped in past then 7 days of prednisone (BP  normal on repeat and reasonable to use). Due to spasm- trial flexeril. Considering her recurrence of issues since off MS medication- she will be seeing neurology soon for their opinion. She also received her b12 shot a day early and will cancel elam visit tomorrow. With no radicular symptoms doubt disc injury.   Follow up neuro next week with sooner Return precautions advised.   Meds ordered this encounter   Medications  . cyclobenzaprine (FLEXERIL) 10 MG tablet    Sig: Take 1 tablet (10 mg total) by mouth 3 (three) times daily as needed for muscle spasms (do not take any day that you take tizanidine).    Dispense:  30 tablet    Refill:  0  . predniSONE (DELTASONE) 20 MG tablet    Sig: Take 2 pills for 3 days, 1 pill for 4 days    Dispense:  10 tablet    Refill:  0  . ketorolac (TORADOL) injection 30 mg    Sig:   . cyanocobalamin ((VITAMIN B-12)) injection 1,000 mcg    Sig:

## 2015-05-05 NOTE — Telephone Encounter (Signed)
Patient Name: Misty [name]  DOB: Feb 27, 1961    Initial Comment Caller states she is having stabbing pain in her lower back. Spasms. She's hurting so bad, she had to pull over on the side of the road. She can't bend.   Nurse Assessment  Nurse: Raphael Gibney, RN, Vera Date/Time Eilene Ghazi Time): 05/05/2015 2:18:07 PM  Confirm and document reason for call. If symptomatic, describe symptoms. You must click the next button to save text entered. ---Caller states she is having severe lower back pain. Pain was intermittent yesterday. Pain level 8.  Has the patient traveled out of the country within the last 30 days? ---Not Applicable  Does the patient have any new or worsening symptoms? ---Yes  Will a triage be completed? ---Yes  Related visit to physician within the last 2 weeks? ---Yes  Does the PT have any chronic conditions? (i.e. diabetes, asthma, etc.) ---Yes  List chronic conditions. ---back pain  Is the patient pregnant or possibly pregnant? (Ask all females between the ages of 61-55) ---No  Is this a behavioral health or substance abuse call? ---No     Guidelines    Guideline Title Affirmed Question Affirmed Notes  Back Pain [1] SEVERE back pain (e.g., excruciating, unable to do any normal activities) AND [2] not improved 2 hours after pain medicine    Final Disposition User   See Physician within 4 Hours (or PCP triage) Raphael Gibney, RN, Vera    Comments  No appts available at [place] scheduled at [facility] with Dr. Garret Reddish at 4:30 pm on 05/05/2015.   Referrals  REFERRED TO PCP OFFICE   Disagree/Comply: Comply

## 2015-05-05 NOTE — Patient Instructions (Signed)
Right low back pain Shot given today for pain b12 also given- please call to cancel appointment tomorrow Use prednisone for 7 days  Etiology unclear- agree with prior providers on importance of neurology follow up. This could be from some arthritis in low back so prednisone may help

## 2015-05-06 ENCOUNTER — Ambulatory Visit: Payer: Medicare Other

## 2015-05-13 ENCOUNTER — Ambulatory Visit: Payer: Medicare Other | Admitting: Neurology

## 2015-05-13 ENCOUNTER — Ambulatory Visit: Payer: Medicare Other

## 2015-05-13 ENCOUNTER — Telehealth: Payer: Self-pay | Admitting: Internal Medicine

## 2015-05-13 MED ORDER — LISINOPRIL 20 MG PO TABS: 20.0000 mg | ORAL_TABLET | Freq: Every day | ORAL | Status: DC

## 2015-05-13 NOTE — Telephone Encounter (Signed)
Pt is out of her lisinopril (PRINIVIL,ZESTRIL) 20 MG tablet [id number]   She is requesting a refill sent to [facility] on [address]

## 2015-05-16 ENCOUNTER — Ambulatory Visit (INDEPENDENT_AMBULATORY_CARE_PROVIDER_SITE_OTHER): Payer: Medicare Other

## 2015-05-16 ENCOUNTER — Other Ambulatory Visit: Payer: Self-pay | Admitting: *Deleted

## 2015-05-16 ENCOUNTER — Encounter: Payer: Self-pay | Admitting: *Deleted

## 2015-05-16 DIAGNOSIS — E538 Deficiency of other specified B group vitamins: Secondary | ICD-10-CM

## 2015-05-16 DIAGNOSIS — G609 Hereditary and idiopathic neuropathy, unspecified: Secondary | ICD-10-CM

## 2015-05-16 MED ORDER — CYANOCOBALAMIN 1000 MCG/ML IJ SOLN
1000.0000 ug | Freq: Once | INTRAMUSCULAR | Status: AC
  Administered 2015-05-16: 1000 ug via INTRAMUSCULAR

## 2015-05-17 ENCOUNTER — Ambulatory Visit (INDEPENDENT_AMBULATORY_CARE_PROVIDER_SITE_OTHER): Payer: Medicare Other | Admitting: Neurology

## 2015-05-17 DIAGNOSIS — M79604 Pain in right leg: Secondary | ICD-10-CM | POA: Diagnosis not present

## 2015-05-17 NOTE — Procedures (Signed)
[facility]  [address], [address]  [place], [place] [address] Tel: [phone] Fax:  [phone] Test Date:  05/17/2015  Patient: Misty [name] DOB: [birth year] Physician: Narda Amber, DO  Sex: Female Height: 5\' 6"  Ref Phys: Charlann Boxer, M.D.  ID#: [id number] Temp: 34.4C Technician: Jerilynn Mages. Dean   Patient Complaints: This is a 55 year-old female referred for evaluation of right foot pain, numbness, and weakness following bunion surgery in 2016.  NCV & EMG Findings: Extensive electrodiagnostic testing of the right lower extremity shows: 1. Right sural and superficial peroneal sensory responses are within normal limits. 2. Right peroneal and tibial motor responses are within normal limits. 3. Right tibial H reflex study is within normal limits. 4. There is no evidence of active or chronic motor axon loss changes affecting any of the tested muscles. Motor unit configuration and recruitment pattern is within normal limits.  Impression: This is a normal study of the right lower extremity. In particular, there is no evidence of a generalized sensorimotor polyneuropathy or lumbosacral radiculopathy.   ___________________________ Narda Amber, DO    Nerve Conduction Studies Anti Sensory Summary Table   Site NR Peak (ms) Norm Peak (ms) P-T Amp (V) Norm P-T Amp  Right Sup Peroneal Anti Sensory (Ant Lat Mall)  34.4C  12 cm    2.5 <4.6 8.1 >4  Right Sural Anti Sensory (Lat Mall)  Calf    3.3 <4.6 8.9 >4   Motor Summary Table   Site NR Onset (ms) Norm Onset (ms) O-P Amp (mV) Norm O-P Amp Site1 Site2 Delta-0 (ms) Dist (cm) Vel (m/s) Norm Vel (m/s)  Right Peroneal Motor (Ext Dig Brev)  Ankle    3.1 <6.0 5.8 >2.5 B Fib Ankle 6.8 33.0 49 >40  B Fib    9.9  4.5  Poplt B Fib 1.8 10.0 56 >40  Poplt    11.7  4.5         Right Tibial Motor (Abd Hall Brev)  Ankle    3.5 <6.0 6.6 >4 Knee Ankle 8.3 38.0 46 >40  Knee    11.8  5.3          H Reflex Studies   NR H-Lat (ms) Lat  Norm (ms) L-R H-Lat (ms)  Right Tibial (Gastroc)     34.29 <35    EMG   Side Muscle Ins Act Fibs Psw Fasc Number Recrt Dur Dur. Amp Amp. Poly Poly. Comment  Right AntTibialis Nml Nml Nml Nml Nml Nml Nml Nml Nml Nml Nml Nml N/A  Right Gastroc Nml Nml Nml Nml 1- Mod-V Nml Nml Nml Nml Nml Nml N/A  Right Flex Dig Long Nml Nml Nml Nml 1- Mod-V Nml Nml Nml Nml Nml Nml N/A  Right RectFemoris Nml Nml Nml Nml Nml Nml Nml Nml Nml Nml Nml Nml N/A  Right GluteusMed Nml Nml Nml Nml Nml Nml Nml Nml Nml Nml Nml Nml N/A  Right BicepsFemS Nml Nml Nml Nml Nml Nml Nml Nml Nml Nml Nml Nml N/A      Waveforms:

## 2015-05-20 ENCOUNTER — Ambulatory Visit: Payer: Medicare Other

## 2015-05-27 ENCOUNTER — Ambulatory Visit: Payer: Medicare Other | Admitting: Podiatry

## 2015-05-30 ENCOUNTER — Ambulatory Visit: Payer: Medicare Other

## 2015-05-31 ENCOUNTER — Ambulatory Visit (INDEPENDENT_AMBULATORY_CARE_PROVIDER_SITE_OTHER): Payer: Medicare Other

## 2015-05-31 ENCOUNTER — Telehealth: Payer: Self-pay

## 2015-05-31 DIAGNOSIS — E538 Deficiency of other specified B group vitamins: Secondary | ICD-10-CM

## 2015-05-31 MED ORDER — CYANOCOBALAMIN 1000 MCG/ML IJ SOLN
1000.0000 ug | Freq: Once | INTRAMUSCULAR | Status: AC
  Administered 2015-05-31: 1000 ug via INTRAMUSCULAR

## 2015-05-31 NOTE — Telephone Encounter (Signed)
After this Friday - she should have monthly B12 injections

## 2015-05-31 NOTE — Telephone Encounter (Signed)
Pt came in today for B12 injection. She wanted you to know that she is feeling a lot better and is not having the numbness that she had been having. She stated that her symptoms are next to nothing and that she has also started taking B6 vitamins.   My question for provider: Pt has received B12 injections on the following dates:  1/27 2/3 2/8 2/16 2/27 3/14  Pt has an appt scheduled for Friday for another B12 inj. I just wanted to verify with you the inj schedule patient has been following.

## 2015-06-03 ENCOUNTER — Ambulatory Visit (INDEPENDENT_AMBULATORY_CARE_PROVIDER_SITE_OTHER): Payer: Medicare Other | Admitting: Emergency Medicine

## 2015-06-03 DIAGNOSIS — E538 Deficiency of other specified B group vitamins: Secondary | ICD-10-CM | POA: Diagnosis not present

## 2015-06-03 MED ORDER — CYANOCOBALAMIN 1000 MCG/ML IJ SOLN
1000.0000 ug | INTRAMUSCULAR | Status: DC
  Administered 2015-06-03: 1000 ug via INTRAMUSCULAR

## 2015-06-06 NOTE — Telephone Encounter (Signed)
Pt needs to schedule monthly b12 injection

## 2015-06-16 DIAGNOSIS — M5430 Sciatica, unspecified side: Secondary | ICD-10-CM | POA: Diagnosis not present

## 2015-06-16 DIAGNOSIS — I10 Essential (primary) hypertension: Secondary | ICD-10-CM | POA: Diagnosis not present

## 2015-06-16 DIAGNOSIS — M129 Arthropathy, unspecified: Secondary | ICD-10-CM | POA: Diagnosis not present

## 2015-06-20 ENCOUNTER — Ambulatory Visit (INDEPENDENT_AMBULATORY_CARE_PROVIDER_SITE_OTHER): Payer: Medicare Other | Admitting: Podiatry

## 2015-06-20 ENCOUNTER — Ambulatory Visit (INDEPENDENT_AMBULATORY_CARE_PROVIDER_SITE_OTHER): Payer: Medicare Other

## 2015-06-20 ENCOUNTER — Encounter: Payer: Self-pay | Admitting: Podiatry

## 2015-06-20 VITALS — BP 161/96 | HR 74 | Resp 17 | Ht 67.0 in | Wt 230.0 lb

## 2015-06-20 DIAGNOSIS — R52 Pain, unspecified: Secondary | ICD-10-CM

## 2015-06-20 DIAGNOSIS — Q828 Other specified congenital malformations of skin: Secondary | ICD-10-CM

## 2015-06-20 DIAGNOSIS — M2042 Other hammer toe(s) (acquired), left foot: Secondary | ICD-10-CM | POA: Diagnosis not present

## 2015-06-20 DIAGNOSIS — M21611 Bunion of right foot: Secondary | ICD-10-CM | POA: Diagnosis not present

## 2015-06-20 NOTE — Progress Notes (Signed)
Subjective:    Patient ID: Misty [name], female    DOB: December 11, 1960, 55 y.o.   MRN: [id number]  HPI 55 year old female presents the office today for concerns of bilateral foot pain. She previously had bunion correction as well as second digit hammertoe repair in December 2016 with Dr. Caffie Pinto. She states that she is continued to have pain along a callus on the right foot along the bunion as well as the tip of the second toe. She also feels that she is walking on rocks in the ball of her foot. She also gets some pain to the fifth toe on the left foot as well as the ball within the left side overlying prominent calluses. She has tried shoe gear modifications, padding, offloading without any relief of symptoms. No recent injury or trauma. No swelling or redness. She gets some occasional numbness of the big toe as his been ongoing since the surgery.   Review of Systems  Musculoskeletal: Positive for back pain.       Muscle pain   Neurological:       Numbness and weakness   All other systems reviewed and are negative.      Objective:   Physical Exam General: AAO x3, NAD  Dermatological: Hyperkeratotic lesions present right foot submetatarsal 3, medial first metatarsal head, dorsal fifth and fourth toe at the PIPJ. Also on the left foot submetatarsal 4 in the dorsal lateral fifth toe. Upon debridement there was no underlying ulceration, drainage or other signs of infection. Scar is well-healed prior surgery.  Vascular: Dorsalis Pedis artery and Posterior Tibial artery pedal pulses are 2/4 bilateral with immedate capillary fill time. Pedal hair growth present. No varicosities and no lower extremity edema present bilateral. There is no pain with calf compression, swelling, warmth, erythema.   Neruologic: Grossly intact via light touch bilateral. Vibratory intact via tuning fork bilateral. Protective threshold with Semmes Wienstein monofilament intact to all pedal sites bilateral.Subjective  there are some numbness to the big toe on the right side.  Musculoskeletal: Hammertoe contractures are present. HAV is present on the right foot and there is tenderness to palpation on the medial aspect of the first metatarsal head. There is no other areas of pinpoint tenderness or pain the vibratory sensation. There is prominent metatarsal heads plantarly atrophy of the fat pad. MMT 5/5.  Gait: Unassisted, Nonantalgic.      Assessment & Plan:  55 year old female with symptomatic HAV, hammertoe deformity right foot -Treatment options discussed including all alternatives, risks, and complications -X-rays were obtained and reviewed with the patient. Views were obtained of the right foot. There does appear to be a medial exostosis off the first metatarsal head. Hammertoe contractures are present. He is no evidence of acute fracture or stress fracture identified at this time. Arthritic changes of the third MTPJ. -Etiology of symptoms were discussed -Hyperkeratotic lesions were debrided 6 without complications or bleeding. -I discussed both conservative and surgical treatment options. At this time she has tried multiple conservative treatments. She'll likely want to proceed with surgical intervention however not until May. I discussed with her right foot Austin bunionectomy as well as second digit hammertoe repair. This is the majority of her symptoms are localized. Discusses this is not a guarantee of complete resolution of symptoms and she understands this. -Metatarsal pads were dispensed as well as offloading pads of the calluses. -Follow-up in 3 weeks or sooner if any problems arise. In the meantime, encouraged to call the office with any questions,  concerns, change in symptoms.   Celesta Gentile, DPM

## 2015-07-11 ENCOUNTER — Ambulatory Visit: Payer: Medicare Other | Admitting: Podiatry

## 2015-07-15 ENCOUNTER — Encounter: Payer: Self-pay | Admitting: Internal Medicine

## 2015-07-15 ENCOUNTER — Ambulatory Visit (INDEPENDENT_AMBULATORY_CARE_PROVIDER_SITE_OTHER): Payer: Medicare Other | Admitting: Internal Medicine

## 2015-07-15 VITALS — BP 132/88 | HR 95 | Temp 98.0°F | Resp 16 | Wt 234.0 lb

## 2015-07-15 DIAGNOSIS — G479 Sleep disorder, unspecified: Secondary | ICD-10-CM | POA: Insufficient documentation

## 2015-07-15 DIAGNOSIS — E538 Deficiency of other specified B group vitamins: Secondary | ICD-10-CM | POA: Diagnosis not present

## 2015-07-15 DIAGNOSIS — I10 Essential (primary) hypertension: Secondary | ICD-10-CM

## 2015-07-15 MED ORDER — TRAZODONE HCL 50 MG PO TABS: 50.0000 mg | ORAL_TABLET | Freq: Every evening | ORAL | Status: DC | PRN

## 2015-07-15 MED ORDER — CYANOCOBALAMIN 1000 MCG/ML IJ SOLN
1000.0000 ug | INTRAMUSCULAR | Status: DC
  Administered 2015-07-15: 1000 ug via INTRAMUSCULAR

## 2015-07-15 NOTE — Progress Notes (Signed)
Pre visit review using our clinic review tool, if applicable. No additional management support is needed unless otherwise documented below in the visit note. 

## 2015-07-15 NOTE — Addendum Note (Signed)
Addended by: Terence Lux B on: 07/15/2015 02:59 PM   Modules accepted: Orders

## 2015-07-15 NOTE — Patient Instructions (Addendum)
  Call Dr. Tomi Likens and schedule an appointment.    Medications reviewed and updated.  Changes include trying trazodone at night. Call if you have any side effects or questions.   Your prescription(s) have been submitted to your pharmacy. Please take as directed and contact our office if you believe you are having problem(s) with the medication(s).  A B12 injection was given today.    Please followup in 6 months

## 2015-07-15 NOTE — Progress Notes (Signed)
Subjective:    Patient ID: Misty [name], female    DOB: [birth year], 55 y.o.   MRN: [id number]  HPI She is here for follow up.  She is complaining of fatigue.    Hypertension: She is taking her medication daily. She is compliant with a low sodium diet.  She denies chest pain, palpitations, edema, shortness of breath and regular headaches. She is exercising regularly.    B12 deficiency:  She had a gastric bypass surgery in the past.  She has been getting B12 injections and they have helped her energy level.  When she was getting them more frequently she felt better.  She would prefer to have the injections than take pills daily - she is unsure if the pills were effective in the past.   Fatigue, no energy:  She is not sleeping as well.  She wakes often and has difficulty getting back to sleep.   She has had sleep problems in the past and took [place] - but that made her tired all day.  She is still exercising.  She is currently in school and about to graduate.  She has no energy and just wants to sleep.  She denies snoring.    She has not been back to see Dr. Tomi Likens - she did not hear from his office after having her eye exam and she has not tried to contact him.   Medications and allergies reviewed with patient and updated if appropriate.  Patient Active Problem List   Diagnosis Date Noted  . Bunion, right 06/20/2015  . Callus of foot 04/27/2015  . Muscle spasm 04/16/2015  . B12 deficiency 03/30/2015  . Hyperglycemia 03/30/2015  . Hammer toe of left foot 02/16/2015  . Fibrosis of skin of lower extremity 02/16/2015  . Status post right foot surgery 10/26/2014  . Bunion 09/24/2014  . Cervical spondylosis with radiculopathy 03/01/2014  . Hammer toe of right foot 01/26/2014  . Benign intracranial hypertension 06/18/2013  . Pain in lower limb 05/22/2013  . Abnormal gait 04/21/2013  . Arthritis 04/21/2013  . Disturbance of skin sensation 04/21/2013  . Cephalalgia 04/21/2013  .  Malignant neoplasm of prostate ([place]) 04/21/2013  . Other hammer toe (acquired) 03/31/2013  . Porokeratosis 03/31/2013  . Pain, foot 03/31/2013  . Vitamin D deficiency 04/18/2010  . FATIGUE 04/14/2010  . BACK PAIN WITH RADICULOPATHY 11/29/2008  . MEDIAL MENISCUS TEAR, LEFT 06/30/2008  . BARRETTS ESOPHAGUS 06/28/2008  . OSTEOARTHRITIS, KNEES, BILATERAL 06/28/2008  . OBESITY 05/24/2008  . HYPERTENSION, BENIGN ESSENTIAL 05/24/2008  . IRRITABLE BOWEL SYNDROME 05/24/2008  . BARIATRIC SURGERY STATUS 03/19/2006  . Multiple sclerosis ([place]) 03/19/2002  . NEOPLASM, MALIGNANT, COLON, HX OF 03/19/1997    Current Outpatient Prescriptions on File Prior to Visit  Medication Sig Dispense Refill  . aspirin 81 MG tablet Take 81 mg by mouth daily.    . Cyanocobalamin (B-12) 1000 MCG/ML KIT Inject 1,000 mcg as directed every 30 (thirty) days. 1 kit 6  . cyclobenzaprine (FLEXERIL) 10 MG tablet Take 1 tablet (10 mg total) by mouth 3 (three) times daily as needed for muscle spasms (do not take any day that you take tizanidine). 30 tablet 0  . hydrochlorothiazide (HYDRODIURIL) 25 MG tablet Take 0.5 tablets (12.5 mg total) by mouth daily. PATIENT NEEDS OFFICE VISIT FOR ADDITIONAL REFILLS 15 tablet 0  . lisinopril (PRINIVIL,ZESTRIL) 20 MG tablet Take 1 tablet (20 mg total) by mouth daily. 90 tablet 3  . predniSONE (DELTASONE) 20 MG tablet Take  2 pills for 3 days, 1 pill for 4 days 10 tablet 0  . predniSONE (DELTASONE) 50 MG tablet     . tiZANidine (ZANAFLEX) 2 MG tablet Take 1 tablet (2 mg total) by mouth every 8 (eight) hours as needed for muscle spasms. 90 tablet 3  . Vitamin D, Ergocalciferol, (DRISDOL) 50000 units CAPS capsule Take 1 capsule (50,000 Units total) by mouth every 7 (seven) days. Sunday 12 capsule 1   Current Facility-Administered Medications on File Prior to Visit  Medication Dose Route Frequency Provider Last Rate Last Dose  . cyanocobalamin ((VITAMIN B-12)) injection 1,000 mcg  1,000 mcg  Intramuscular Weekly [name], MD   1,000 mcg at 06/03/15 1435    Past Medical History  Diagnosis Date  . Hypertension   . MS (multiple sclerosis) ([place])   . Colon cancer ([place])   . Back pain   . Umbilical hernia   . Other hammer toe (acquired) 03/31/2013  . Pneumonia     hx of  . GERD (gastroesophageal reflux disease)   . Diarrhea   . Arthritis   . Insomnia     Past Surgical History  Procedure Laterality Date  . Colon surgery    . Cesarean section    . Foot surgery Right   . Abdominal hysterectomy    . Colonoscopy    . Gastric bypass    . Anterior cervical decomp/discectomy fusion N/A 03/01/2014    Procedure: ANTERIOR CERVICAL DECOMPRESSION/DISCECTOMY FUSION 1 LEVEL;  Surgeon: Charlie Pitter, MD;  Location: [facility];  Service: Neurosurgery;  Laterality: N/A;  ANTERIOR CERVICAL DECOMPRESSION/DISCECTOMY FUSION 1 LEVEL  . Bunionectomy Right 10/14/2014    '@[facility]'$   . Hammer toe repair Right 10/14/2014    RT #2, '@[facility]'$   . Tenotomy Right 10/14/2014    RT #3, '@[facility]'$     Social History   Social History  . Marital Status: Single    Spouse Name: N/A  . Number of Children: N/A  . Years of Education: N/A   Social History Main Topics  . Smoking status: Current Some Day Smoker -- 0.25 packs/day for 20 years    Types: Cigarettes  . Smokeless tobacco: Never Used  . Alcohol Use: 0.0 oz/week    0 Standard drinks or equivalent per week     Comment: 1/2 pint daily  . Drug Use: No  . Sexual Activity:    Partners: Male   Other Topics Concern  . None   Social History Narrative    Family History  Problem Relation Age of Onset  . Stroke Mother   . Hypertension Mother   . Hyperlipidemia Mother   . Diabetes Mother   . Diabetes Brother   . Hypertension Brother   . Hypertension Brother   . Hypertension Brother   . Hypertension Brother   . Hypertension Brother   . Alcoholism Brother     Review of Systems  Constitutional: Positive for fatigue (no energy). Negative for fever  and appetite change.  Respiratory: Negative for cough, shortness of breath and wheezing.   Cardiovascular: Negative for chest pain, palpitations and leg swelling.  Gastrointestinal: Negative for abdominal pain, diarrhea, constipation and blood in stool.       No gerd  Neurological: Positive for dizziness (with quick movements). Negative for light-headedness and headaches.  Psychiatric/Behavioral: Positive for sleep disturbance.       Objective:   Filed Vitals:   07/15/15 1342  BP: 132/88  Pulse: 95  Temp: 98 F (36.7 C)  Resp: 16   Filed Weights   07/15/15 1342  Weight: 234 lb (106.142 kg)   Body mass index is 36.64 kg/(m^2).   Physical Exam Constitutional: Appears well-developed and well-nourished. No distress.  Neck: Neck supple. No tracheal deviation present. No thyromegaly present.  No carotid bruit. No cervical adenopathy.   Cardiovascular: Normal rate, regular rhythm and normal heart sounds.   No murmur heard.  No edema Pulmonary/Chest: Effort normal and breath sounds normal. No respiratory distress. No wheezes.        Assessment & Plan:    She will call Dr Georgie Chard office to schedule an appt to be evaluated to possible MS  See Problem List for Assessment and Plan of chronic medical problems.  F/u in 6 months, sooner if needed

## 2015-07-15 NOTE — Assessment & Plan Note (Addendum)
Continue monthly injections - one given today

## 2015-07-15 NOTE — Assessment & Plan Note (Signed)
BP well controlled Current regimen effective and well tolerated Continue current medications at current doses  

## 2015-07-15 NOTE — Assessment & Plan Note (Signed)
Difficulty getting and staying asleep Significant fatigue - which may be multifactorial, but will try trazodone to help improve sleep quality Denies snoring so will hold off on OSA eval for now

## 2015-08-03 DIAGNOSIS — J9801 Acute bronchospasm: Secondary | ICD-10-CM | POA: Diagnosis not present

## 2015-08-03 DIAGNOSIS — J209 Acute bronchitis, unspecified: Secondary | ICD-10-CM | POA: Diagnosis not present

## 2015-08-10 ENCOUNTER — Telehealth: Payer: Self-pay | Admitting: Internal Medicine

## 2015-08-10 MED ORDER — FLUCONAZOLE 150 MG PO TABS: 150.0000 mg | ORAL_TABLET | Freq: Once | ORAL | Status: DC

## 2015-08-10 NOTE — Telephone Encounter (Signed)
Spoke with pt to inform.  

## 2015-08-10 NOTE — Telephone Encounter (Signed)
rx sent

## 2015-08-10 NOTE — Telephone Encounter (Signed)
Please advise 

## 2015-08-10 NOTE — Telephone Encounter (Signed)
Patient states she was out of town and went to [place] to be seen for bronchitis.  Patient states meds has caused her to develop a yeast infection.  Would like to know if Dr. Quay Burow could send a script to [facility] at [facility].

## 2015-08-12 ENCOUNTER — Encounter: Payer: Self-pay | Admitting: Podiatry

## 2015-08-12 ENCOUNTER — Ambulatory Visit (INDEPENDENT_AMBULATORY_CARE_PROVIDER_SITE_OTHER): Payer: Medicare Other | Admitting: Podiatry

## 2015-08-12 DIAGNOSIS — Q828 Other specified congenital malformations of skin: Secondary | ICD-10-CM | POA: Diagnosis not present

## 2015-08-17 ENCOUNTER — Ambulatory Visit: Payer: Medicare Other

## 2015-08-18 ENCOUNTER — Ambulatory Visit (INDEPENDENT_AMBULATORY_CARE_PROVIDER_SITE_OTHER)
Admission: RE | Admit: 2015-08-18 | Discharge: 2015-08-18 | Disposition: A | Discharge status: Home / Self Care | Payer: Medicare Other | Source: Ambulatory Visit | Attending: Family | Admitting: Family

## 2015-08-18 ENCOUNTER — Other Ambulatory Visit (HOSPITAL_COMMUNITY)
Admission: RE | Admit: 2015-08-18 | Discharge: 2015-08-18 | Disposition: A | Discharge status: Home / Self Care | Payer: Medicare Other | Source: Ambulatory Visit | Attending: Family | Admitting: Family

## 2015-08-18 ENCOUNTER — Ambulatory Visit: Payer: Medicare Other

## 2015-08-18 ENCOUNTER — Ambulatory Visit (INDEPENDENT_AMBULATORY_CARE_PROVIDER_SITE_OTHER): Payer: Medicare Other | Admitting: Family

## 2015-08-18 ENCOUNTER — Encounter: Payer: Self-pay | Admitting: Family

## 2015-08-18 VITALS — BP 128/70 | HR 85 | Temp 98.0°F | Ht 67.0 in | Wt 236.2 lb

## 2015-08-18 DIAGNOSIS — E538 Deficiency of other specified B group vitamins: Secondary | ICD-10-CM | POA: Diagnosis not present

## 2015-08-18 DIAGNOSIS — N760 Acute vaginitis: Secondary | ICD-10-CM | POA: Diagnosis not present

## 2015-08-18 DIAGNOSIS — R05 Cough: Secondary | ICD-10-CM

## 2015-08-18 DIAGNOSIS — R059 Cough, unspecified: Secondary | ICD-10-CM

## 2015-08-18 MED ORDER — FLUCONAZOLE 150 MG PO TABS: 150.0000 mg | ORAL_TABLET | Freq: Once | ORAL | Status: DC

## 2015-08-18 MED ORDER — IPRATROPIUM-ALBUTEROL 0.5-2.5 (3) MG/3ML IN SOLN
3.0000 mL | Freq: Once | RESPIRATORY_TRACT | Status: AC
  Administered 2015-08-18: 3 mL via RESPIRATORY_TRACT

## 2015-08-18 MED ORDER — CYANOCOBALAMIN 1000 MCG/ML IJ SOLN
1000.0000 ug | Freq: Once | INTRAMUSCULAR | Status: AC
  Administered 2015-08-18: 1000 ug via INTRAMUSCULAR

## 2015-08-18 MED ORDER — VENTOLIN HFA 108 (90 BASE) MCG/ACT IN AERS: 1.0000 | INHALATION_SPRAY | Freq: Four times a day (QID) | RESPIRATORY_TRACT | Status: DC | PRN

## 2015-08-18 MED ORDER — LEVOFLOXACIN 750 MG PO TABS: 750.0000 mg | ORAL_TABLET | Freq: Every day | ORAL | Status: DC

## 2015-08-18 MED ORDER — HYDROCODONE-HOMATROPINE 5-1.5 MG/5ML PO SYRP: 5.0000 mL | ORAL_SOLUTION | Freq: Every evening | ORAL | Status: DC | PRN

## 2015-08-18 NOTE — Progress Notes (Signed)
Patient ID: [name], female   DOB: [birth year], 55 y.o.   MRN: [id number]  Subjective: 55 year old female presents the office today for concerns of painful calluses to both of her feet. Denies any redness or drainage or any swelling. Denies any systemic complaints such as fevers, chills, nausea, vomiting. No acute changes since last appointment, and no other complaints at this time.   Objective: AAO x3, NAD DP/PT pulses palpable bilaterally, CRT less than 3 seconds Hyperkeratotic lesion present submetatarsal 3 right foot, medial first metatarsal head, dorsal fifth and fourth digits of the PIPJ. Also on the left first metatarsal for and fifth digit. Upon debridement no underlying ulceration, drainage or other signs of infection. No other lesions pre-ulcer lesions. No pain with calf compression, swelling, warmth, erythema  Assessment: Symptomatic hyperkeratotic lesions  Plan: -All treatment options discussed with the patient including all alternatives, risks, complications.  -Hyperkeratotic lesions were debrided 6 without complications or bleeding. -F/U 3 months  -Patient encouraged to call the office with any questions, concerns, change in symptoms.   Celesta Gentile, DPM

## 2015-08-18 NOTE — Progress Notes (Signed)
Pre visit review using our clinic review tool, if applicable. No additional management support is needed unless otherwise documented below in the visit note. 

## 2015-08-18 NOTE — Patient Instructions (Addendum)
  We will wait on chest x-ray prior to further treatment.  Use albuterol every 6 hours for first 24 hours to get good medication into the lungs and loosen congestion; after, you may use as needed and eventually stop all together when cough resolves.  Suspect yeast infection.  If there is no improvement in your symptoms, or if there is any worsening of symptoms, or if you have any additional concerns, please return for re-evaluation; or, if we are closed, consider going to the Emergency Room for evaluation if symptoms urgent.

## 2015-08-18 NOTE — Progress Notes (Signed)
Subjective:    Patient ID: Misty [name], female    DOB: 05-Dec-1960, 54 y.o.   MRN: [id number]   Misty [name] is a 55 y.o. female who presents today for an acute visit.    HPI Comments: Patient here for multiple reasons. Patient is here for evaluation of cough. She was treated at [facility] weeks ago for cough,  she was given prednisone, zpak and inhaler. She also had a breathing treatment while there. Denies exertional chest pain or pressure, numbness or tingling radiating to left arm or jaw, palpitations, dizziness, frequent headaches, changes in vision, or shortness of breath.   Patient also complains of yeast infection from antibiotic couple of weeks ago. Felt better on Monistat 5 days. Took Diflucan 4 days ago with some relief but notes it usually takes two doses to clear infection. Complains of itching on the outside of her vagina. No vaginal bleeding, vaginal discharge, edness. Outside of vagina is irritated when coughs as having incontinence with cough. Changed underwear six times yesterday due to urinary incontinence. Partial hysterectomy.    Patient is also here for B12 injection.  Smoker ; 2-3 cigarettes a day for the past couple years. She drinks and she has a cocktail.  Cough This is a recurrent problem. The current episode started 1 to 4 weeks ago. The problem has been unchanged. The cough is productive of sputum. Associated symptoms include a fever and wheezing. Pertinent negatives include no chest pain, chills, sore throat or shortness of breath. The symptoms are aggravated by lying down. She has tried ipratropium inhaler (prednisone, zpak) for the symptoms. Her past medical history is significant for pneumonia. There is no history of asthma, COPD or environmental allergies.   Past Medical History  Diagnosis Date  . Hypertension   . MS (multiple sclerosis) ([place])   . Colon cancer ([place])   . Back pain   . Umbilical hernia   . Other hammer toe (acquired) 03/31/2013    . Pneumonia     hx of  . GERD (gastroesophageal reflux disease)   . Diarrhea   . Arthritis   . Insomnia    Allergies: Phenergan and Promethazine Current Outpatient Prescriptions on File Prior to Visit  Medication Sig Dispense Refill  . aspirin 81 MG tablet Take 81 mg by mouth daily.    . Cyanocobalamin (B-12) 1000 MCG/ML KIT Inject 1,000 mcg as directed every 30 (thirty) days. 1 kit 6  . cyclobenzaprine (FLEXERIL) 10 MG tablet Take 1 tablet (10 mg total) by mouth 3 (three) times daily as needed for muscle spasms (do not take any day that you take tizanidine). 30 tablet 0  . fluconazole (DIFLUCAN) 150 MG tablet Take 1 tablet (150 mg total) by mouth once. 1 tablet 0  . hydrochlorothiazide (HYDRODIURIL) 25 MG tablet Take 0.5 tablets (12.5 mg total) by mouth daily. PATIENT NEEDS OFFICE VISIT FOR ADDITIONAL REFILLS 15 tablet 0  . lisinopril (PRINIVIL,ZESTRIL) 20 MG tablet Take 1 tablet (20 mg total) by mouth daily. 90 tablet 3  . tiZANidine (ZANAFLEX) 2 MG tablet Take 1 tablet (2 mg total) by mouth every 8 (eight) hours as needed for muscle spasms. 90 tablet 3  . traZODone (DESYREL) 50 MG tablet Take 1-2 tablets (50-100 mg total) by mouth at bedtime as needed for sleep. 60 tablet 3  . Vitamin D, Ergocalciferol, (DRISDOL) 50000 units CAPS capsule Take 1 capsule (50,000 Units total) by mouth every 7 (seven) days. Sunday 12 capsule 1   Current  Facility-Administered Medications on File Prior to Visit  Medication Dose Route Frequency Provider Last Rate Last Dose  . cyanocobalamin ((VITAMIN B-12)) injection 1,000 mcg  1,000 mcg Intramuscular Weekly [name], MD   1,000 mcg at 06/03/15 1435  . cyanocobalamin ((VITAMIN B-12)) injection 1,000 mcg  1,000 mcg Intramuscular Q30 days [name], MD   1,000 mcg at 07/15/15 1410    Social History  Substance Use Topics  . Smoking status: Current Some Day Smoker -- 0.25 packs/day for 20 years    Types: Cigarettes  . Smokeless tobacco: Never Used   . Alcohol Use: 0.0 oz/week    0 Standard drinks or equivalent per week     Comment: 1/2 pint daily    Review of Systems  Constitutional: Positive for fever. Negative for chills.  HENT: Positive for congestion. Negative for sinus pressure and sore throat.   Respiratory: Positive for cough, chest tightness and wheezing. Negative for shortness of breath.   Cardiovascular: Negative for chest pain and palpitations.  Gastrointestinal: Negative for nausea and vomiting.  Genitourinary: Negative for dysuria, frequency, hematuria and flank pain.  Allergic/Immunologic: Negative for environmental allergies.      Objective:    BP 128/70 mmHg  Pulse 85  Temp(Src) 98 F (36.7 C) (Oral)  Ht 5' 7"  (1.702 m)  Wt 236 lb 4 oz (107.162 kg)  BMI 36.99 kg/m2  SpO2 97%   Physical Exam  Constitutional: She appears well-developed and well-nourished.  HENT:  Head: Normocephalic and atraumatic.  Right Ear: Hearing, tympanic membrane, external ear and ear canal normal. No drainage, swelling or tenderness. No foreign bodies. Tympanic membrane is not erythematous and not bulging. No middle ear effusion. No decreased hearing is noted.  Left Ear: Hearing, tympanic membrane, external ear and ear canal normal. No drainage, swelling or tenderness. No foreign bodies. Tympanic membrane is not erythematous and not bulging.  No middle ear effusion. No decreased hearing is noted.  Nose: Nose normal. No rhinorrhea. Right sinus exhibits no maxillary sinus tenderness and no frontal sinus tenderness. Left sinus exhibits no maxillary sinus tenderness and no frontal sinus tenderness.  Mouth/Throat: Uvula is midline, oropharynx is clear and moist and mucous membranes are normal. No oropharyngeal exudate, posterior oropharyngeal edema, posterior oropharyngeal erythema or tonsillar abscesses.  Eyes: Conjunctivae are normal.  Cardiovascular: Normal rate, regular rhythm, normal heart sounds and normal pulses.   Pulmonary/Chest:  Effort normal. She has wheezes. She has no rhonchi. She has no rales.  Few expiratory wheezes.  Abdominal: There is no CVA tenderness.  Genitourinary: There is no rash, tenderness or lesion on the right labia. There is no rash, tenderness or lesion on the left labia. Right adnexum displays no mass, no tenderness and no fullness. Left adnexum displays no mass, no tenderness and no fullness. No erythema, tenderness or bleeding in the vagina. No foreign body around the vagina. Vaginal discharge (scant, thick white discharge) found.  No vulvovaginal erythema. No lesions. Discharge is thick and white, not purulent. No cervix.   Lymphadenopathy:       Head (right side): No submental, no submandibular, no tonsillar, no preauricular, no posterior auricular and no occipital adenopathy present.       Head (left side): No submental, no submandibular, no tonsillar, no preauricular, no posterior auricular and no occipital adenopathy present.    She has no cervical adenopathy.  Neurological: She is alert.  Skin: Skin is warm and dry.  Psychiatric: She has a normal mood and affect. Her speech  is normal and behavior is normal. Thought content normal.  Vitals reviewed.  Patient felt significantly better after albuterol treatment. Lung sounds clear and increased.      Assessment & Plan:   1. Cough Working diagnosis of COPD flare verus viral etiology. Patient has never been formally diagnosed with COPD however she is a smoker although only smoked the past 2-3 years.  Chest x-ray is negative for pneumonia however shows increased peribronchial opacities indicative of bronchitis. Due to duration of the symptoms, I have decided to prescribe an antibiotic.  - VENTOLIN HFA 108 (90 Base) MCG/ACT inhaler; Inhale 1 puff into the lungs every 6 (six) hours as needed for wheezing or shortness of breath.  Dispense: 1 Inhaler; Refill: 2 - DG Chest 2 View - ipratropium-albuterol (DUONEB) 0.5-2.5 (3) MG/3ML nebulizer solution  3 mL; Take 3 mLs by nebulization once.  2. Vaginitis and vulvovaginitis Working diagnosis of vaginal irritation likely urine from stress incontinence, coughing. Pending wet prep to further evaluate for candidiasis and bacterial vaginosis. Patient had some improvement on Diflucan, I went ahead and prescribed another dose due to patient preference.   -Wet prep    I am having Misty [name] maintain her aspirin, hydrochlorothiazide, Vitamin D (Ergocalciferol), B-12, tiZANidine, cyclobenzaprine, lisinopril, traZODone, fluconazole, and VENTOLIN HFA. We will continue to administer cyanocobalamin and cyanocobalamin.   Meds ordered this encounter  Medications  . VENTOLIN HFA 108 (90 Base) MCG/ACT inhaler    Sig:      Start medications as prescribed and explained to patient on After Visit Summary ( AVS). Risks, benefits, and alternatives of the medications and treatment plan prescribed today were discussed, and patient expressed understanding.   Education regarding symptom management and diagnosis given to patient.   Follow-up:Plan follow-up and return precautions given if any worsening symptoms or change in condition.   Continue to follow with [name], MD for routine health maintenance.   Misty [name] and I agreed with plan.   Mable Paris, FNP

## 2015-08-19 ENCOUNTER — Other Ambulatory Visit: Payer: Self-pay | Admitting: *Deleted

## 2015-08-19 DIAGNOSIS — N760 Acute vaginitis: Secondary | ICD-10-CM

## 2015-08-19 DIAGNOSIS — R05 Cough: Secondary | ICD-10-CM

## 2015-08-19 DIAGNOSIS — R059 Cough, unspecified: Secondary | ICD-10-CM

## 2015-08-19 MED ORDER — DOXYCYCLINE MONOHYDRATE 100 MG PO CAPS: 100.0000 mg | ORAL_CAPSULE | Freq: Two times a day (BID) | ORAL | Status: DC

## 2015-08-19 MED ORDER — VENTOLIN HFA 108 (90 BASE) MCG/ACT IN AERS: 1.0000 | INHALATION_SPRAY | Freq: Four times a day (QID) | RESPIRATORY_TRACT | Status: DC | PRN

## 2015-08-19 NOTE — Telephone Encounter (Signed)
Please call patient have her stop Levaquin for cough. I would like for her to start doxycycline due to drug drug interaction with Levaquin and Diflucan.

## 2015-08-19 NOTE — Telephone Encounter (Signed)
Receive fax it states possible interaction with the Levofloxacin & Diflucan may cause increased risk of dysrhythmia. Wanting to know if MD want pt to still take...Misty [name]

## 2015-08-19 NOTE — Addendum Note (Signed)
Addended by: Aviva Signs M on: 08/19/2015 05:07 PM   Modules accepted: Orders

## 2015-08-19 NOTE — Telephone Encounter (Signed)
Pt stated that she is worried about getting another yeast infection. She is requesting another rx for #2 diflucan.

## 2015-08-19 NOTE — Telephone Encounter (Signed)
Notified pt w/MD response. Pt also stated that the pharmacy didn't give her an inhaler. Inform  Her inhaler was sent also will resend to [facility]...Johny Chess

## 2015-08-19 NOTE — Telephone Encounter (Signed)
Pt called stated she is concern about taking Doxycyline, she just got done taking diflucan yesterday. She just want to make sure that her UTI will not come back again once she start on this new me. Please call her back

## 2015-08-20 MED ORDER — FLUCONAZOLE 150 MG PO TABS: 150.0000 mg | ORAL_TABLET | Freq: Once | ORAL | Status: DC

## 2015-08-20 NOTE — Telephone Encounter (Signed)
Spoke to [place], [place] and request has been sent in.   LVM for pt to call back as soon as possible.   RE: rx sent to [facility]

## 2015-08-22 LAB — CERVICOVAGINAL ANCILLARY ONLY: Wet Prep (BD Affirm): NEGATIVE

## 2015-09-12 ENCOUNTER — Ambulatory Visit (INDEPENDENT_AMBULATORY_CARE_PROVIDER_SITE_OTHER): Payer: Medicare Other | Admitting: Family Medicine

## 2015-09-12 ENCOUNTER — Encounter: Payer: Self-pay | Admitting: Family Medicine

## 2015-09-12 VITALS — BP 110/72 | HR 109 | Temp 98.0°F | Ht 67.0 in | Wt 237.9 lb

## 2015-09-12 DIAGNOSIS — M545 Low back pain, unspecified: Secondary | ICD-10-CM

## 2015-09-12 LAB — POCT URINALYSIS DIPSTICK
Bilirubin, UA: NEGATIVE
Blood, UA: NEGATIVE
Glucose, UA: NEGATIVE
Ketones, UA: NEGATIVE
Leukocytes, UA: NEGATIVE
Nitrite, UA: NEGATIVE
Protein, UA: NEGATIVE
Spec Grav, UA: >=1.03
Urobilinogen, UA: 0.2
pH, UA: 5

## 2015-09-12 MED ORDER — TRAMADOL HCL 50 MG PO TABS: 50.0000 mg | ORAL_TABLET | Freq: Two times a day (BID) | ORAL | Status: DC | PRN

## 2015-09-12 NOTE — Progress Notes (Signed)
Pre visit review using our clinic review tool, if applicable. No additional management support is needed unless otherwise documented below in the visit note. 

## 2015-09-12 NOTE — Progress Notes (Signed)
HPI:  Misty [name] is a pleasant 55 year old here for an acute visit for back pain. She reports she has chronic back pain with intermittent flares of the pain. She reports she has been on pain medications for this chronically, but when she went to the pharmacy to get a refill they told her she could not have any more refills until she was seen by her PCP. She reports she takes "what is called. Hydro.... Something... Hydrocodone?" Reports she was given a visit with her doctor in 2 days, but she cannot wait that long. Then reports if she can't get hydrocodone, perhaps tramadol would help? Reports it has not helped as well in the past. Reports she has chronic intermittent back pain issues. It seems from her report she sees in a number of specialists. Reports this flare started about a week ago. Reports she can take Tylenol and ibuprofen, but not sure if these will help. Pain is on the right low back. Reports has muscle relaxers at home. No radiation to lower extremity, weakness, fevers, malaise, dysuria, numbness or bowel or bladder dysfunction. Reports her daughter said maybe it was her pancreas and she is worried about her urine.  ROS: See pertinent positives and negatives per HPI.  Past Medical History  Diagnosis Date  . Hypertension   . MS (multiple sclerosis) ([place])   . Colon cancer ([place])   . Back pain   . Umbilical hernia   . Other hammer toe (acquired) 03/31/2013  . Pneumonia     hx of  . GERD (gastroesophageal reflux disease)   . Diarrhea   . Arthritis   . Insomnia     Past Surgical History  Procedure Laterality Date  . Colon surgery    . Cesarean section    . Foot surgery Right   . Abdominal hysterectomy    . Colonoscopy    . Gastric bypass    . Anterior cervical decomp/discectomy fusion N/A 03/01/2014    Procedure: ANTERIOR CERVICAL DECOMPRESSION/DISCECTOMY FUSION 1 LEVEL;  Surgeon: Charlie Pitter, MD;  Location: [facility];  Service: Neurosurgery;  Laterality: N/A;   ANTERIOR CERVICAL DECOMPRESSION/DISCECTOMY FUSION 1 LEVEL  . Bunionectomy Right 10/14/2014    @[facility]   . Hammer toe repair Right 10/14/2014    RT #2, @[facility]   . Tenotomy Right 10/14/2014    RT #3, @[facility]     Family History  Problem Relation Age of Onset  . Stroke Mother   . Hypertension Mother   . Hyperlipidemia Mother   . Diabetes Mother   . Diabetes Brother   . Hypertension Brother   . Hypertension Brother   . Hypertension Brother   . Hypertension Brother   . Hypertension Brother   . Alcoholism Brother     Social History   Social History  . Marital Status: Single    Spouse Name: N/A  . Number of Children: N/A  . Years of Education: N/A   Social History Main Topics  . Smoking status: Current Some Day Smoker -- 0.25 packs/day for 20 years    Types: Cigarettes  . Smokeless tobacco: Never Used  . Alcohol Use: 0.0 oz/week    0 Standard drinks or equivalent per week     Comment: 1/2 pint daily  . Drug Use: No  . Sexual Activity:    Partners: Male   Other Topics Concern  . None   Social History Narrative     Current outpatient prescriptions:  .  aspirin 81 MG tablet, Take 81 mg  by mouth daily., Disp: , Rfl:  .  Cyanocobalamin (B-12) 1000 MCG/ML KIT, Inject 1,000 mcg as directed every 30 (thirty) days., Disp: 1 kit, Rfl: 6 .  cyclobenzaprine (FLEXERIL) 10 MG tablet, Take 1 tablet (10 mg total) by mouth 3 (three) times daily as needed for muscle spasms (do not take any day that you take tizanidine)., Disp: 30 tablet, Rfl: 0 .  doxycycline (MONODOX) 100 MG capsule, Take 1 capsule (100 mg total) by mouth 2 (two) times daily., Disp: 10 capsule, Rfl: 0 .  hydrochlorothiazide (HYDRODIURIL) 25 MG tablet, Take 0.5 tablets (12.5 mg total) by mouth daily. PATIENT NEEDS OFFICE VISIT FOR ADDITIONAL REFILLS, Disp: 15 tablet, Rfl: 0 .  lisinopril (PRINIVIL,ZESTRIL) 20 MG tablet, Take 1 tablet (20 mg total) by mouth daily., Disp: 90 tablet, Rfl: 3 .  tiZANidine (ZANAFLEX) 2 MG tablet, Take 1  tablet (2 mg total) by mouth every 8 (eight) hours as needed for muscle spasms., Disp: 90 tablet, Rfl: 3 .  traZODone (DESYREL) 50 MG tablet, Take 1-2 tablets (50-100 mg total) by mouth at bedtime as needed for sleep., Disp: 60 tablet, Rfl: 3 .  VENTOLIN HFA 108 (90 Base) MCG/ACT inhaler, Inhale 1 puff into the lungs every 6 (six) hours as needed for wheezing or shortness of breath., Disp: 1 Inhaler, Rfl: 2 .  Vitamin D, Ergocalciferol, (DRISDOL) 50000 units CAPS capsule, Take 1 capsule (50,000 Units total) by mouth every 7 (seven) days. Sunday, Disp: 12 capsule, Rfl: 1 .  traMADol (ULTRAM) 50 MG tablet, Take 1 tablet (50 mg total) by mouth every 12 (twelve) hours as needed., Disp: 10 tablet, Rfl: 0  Current facility-administered medications:  .  cyanocobalamin ((VITAMIN B-12)) injection 1,000 mcg, 1,000 mcg, Intramuscular, Weekly, Pincus Sanes, MD, 1,000 mcg at 06/03/15 1435 .  cyanocobalamin ((VITAMIN B-12)) injection 1,000 mcg, 1,000 mcg, Intramuscular, Q30 days, Pincus Sanes, MD, 1,000 mcg at 07/15/15 1410  EXAM:  Filed Vitals:   09/12/15 1543  BP: 110/72  Pulse: 109  Temp: 98 F (36.7 C)    Body mass index is 37.25 kg/(m^2).  GENERAL: vitals reviewed and listed above, alert, oriented, appears well hydrated and in no acute distress  HEENT: atraumatic, conjunttiva clear, no obvious abnormalities on inspection of external nose and ears  NECK: no obvious masses on inspection  LUNGS: clear to auscultation bilaterally, no wheezes, rales or rhonchi, good air movement  CV: HRRR, no peripheral edema  ABD: No CVA tenderness to palpation, no pain on palpation of the abdomen  MS: moves all extremities without noticeable abnormality Normal Gait Normal inspection of back, no obvious scoliosis or leg length descrepancy Soft tissue TTP at: Right lower lumbar paraspinal muscles, Right SI joint region -/+ tests: neg trendelenburg,-facet loading, -SLRT, -CLRT, -FABER, -FADIR Normal muscle  strength, sensation to light touch and DTRs in LEs bilaterally  PSYCH: pleasant and cooperative, no obvious depression or anxiety  ASSESSMENT AND PLAN:  Discussed the following assessment and plan:  Right-sided low back pain without sciatica - Plan: POC Urinalysis Dipstick  -we discussed possible serious and likely etiologies, workup and treatment, treatment risks and return precautions - suspect muscle strain vs Sacroiliitis vs OA back; udip ok -after this discussion, Misty [name] opted for HEP, conservative care at home, small amount of tramadol provided after discussion risks/not good for chronic use for musculoskeletal pain and advise any further refills on opiods/controlled meds come from PCP -follow up advised with PCP in 1 week -of course, we advised Misty [name]  to  return or notify a doctor immediately if symptoms worsen or persist or new concerns arise  -Patient advised to return or notify a doctor immediately if symptoms worsen or persist or new concerns arise.  Patient Instructions  BEFORE YOU LEAVE: -low back exercises  FOR THE BACK PAIN: -schedule follow up with your doctor in 1 week - sooner if needed -do the exercises provided 4 days per week - avoid yoga poses that worsen pain -heat for 15 minutes twice daily -muscle relaxer if needed -topical sports creams with capsaicin or menthol and aleve or tylenol per instructions for pain -reserve the tramadol only for the worst days and to improve function on those days      Jeramie Scogin, Jarrett Soho R.

## 2015-09-12 NOTE — Patient Instructions (Signed)
BEFORE YOU LEAVE: -low back exercises  FOR THE BACK PAIN: -schedule follow up with your doctor in 1 week - sooner if needed -do the exercises provided 4 days per week - avoid yoga poses that worsen pain -heat for 15 minutes twice daily -muscle relaxer if needed -topical sports creams with capsaicin or menthol and aleve or tylenol per instructions for pain -reserve the tramadol only for the worst days and to improve function on those days

## 2015-09-27 ENCOUNTER — Telehealth: Payer: Self-pay | Admitting: Internal Medicine

## 2015-09-27 ENCOUNTER — Ambulatory Visit (INDEPENDENT_AMBULATORY_CARE_PROVIDER_SITE_OTHER): Payer: Medicare Other

## 2015-09-27 DIAGNOSIS — E538 Deficiency of other specified B group vitamins: Secondary | ICD-10-CM

## 2015-09-27 MED ORDER — CYANOCOBALAMIN 1000 MCG/ML IJ SOLN
1000.0000 ug | Freq: Once | INTRAMUSCULAR | Status: AC
  Administered 2015-09-27: 1000 ug via INTRAMUSCULAR

## 2015-09-27 NOTE — Telephone Encounter (Signed)
Pt called in and that her muscles are hurting and she feels like she needs another b12, can you some in today and get one?

## 2015-09-27 NOTE — Telephone Encounter (Signed)
Pt came in for B12 this afternoon

## 2015-09-27 NOTE — Telephone Encounter (Signed)
Ok to give if she is due - not sure that is the cause of her symptoms

## 2015-09-27 NOTE — Telephone Encounter (Signed)
Please advise 

## 2015-10-04 ENCOUNTER — Ambulatory Visit (INDEPENDENT_AMBULATORY_CARE_PROVIDER_SITE_OTHER): Payer: Medicare Other | Admitting: Internal Medicine

## 2015-10-04 VITALS — BP 126/72 | HR 95 | Temp 98.0°F | Resp 16 | Wt 237.0 lb

## 2015-10-04 DIAGNOSIS — R5382 Chronic fatigue, unspecified: Secondary | ICD-10-CM

## 2015-10-04 DIAGNOSIS — M5412 Radiculopathy, cervical region: Secondary | ICD-10-CM

## 2015-10-04 DIAGNOSIS — M545 Low back pain, unspecified: Secondary | ICD-10-CM

## 2015-10-04 DIAGNOSIS — I10 Essential (primary) hypertension: Secondary | ICD-10-CM

## 2015-10-04 DIAGNOSIS — G8929 Other chronic pain: Secondary | ICD-10-CM

## 2015-10-04 DIAGNOSIS — M5410 Radiculopathy, site unspecified: Secondary | ICD-10-CM

## 2015-10-04 DIAGNOSIS — R739 Hyperglycemia, unspecified: Secondary | ICD-10-CM

## 2015-10-04 DIAGNOSIS — M542 Cervicalgia: Secondary | ICD-10-CM

## 2015-10-04 DIAGNOSIS — M4722 Other spondylosis with radiculopathy, cervical region: Secondary | ICD-10-CM

## 2015-10-04 DIAGNOSIS — M6281 Muscle weakness (generalized): Secondary | ICD-10-CM

## 2015-10-04 DIAGNOSIS — E538 Deficiency of other specified B group vitamins: Secondary | ICD-10-CM

## 2015-10-04 MED ORDER — HYDROCODONE-ACETAMINOPHEN 5-325 MG PO TABS: 1.0000 | ORAL_TABLET | Freq: Four times a day (QID) | ORAL | Status: DC | PRN

## 2015-10-04 NOTE — Patient Instructions (Addendum)
For pain take the hydrocodone - acetaminophen  as prescribed - DO NOT take more than prescribed.   Have blood work done  Follow up with me in 2 weeks  A referral for orthopedics surgery was ordered.   Follow up with GI for you current symptoms.

## 2015-10-04 NOTE — Progress Notes (Signed)
Subjective:    Patient ID: Misty [name], female    DOB: 12-29-1960, 55 y.o.   MRN: [id number]  HPI She is here for follow up.  Three weeks she has not felt well.  She is frustrated by unwell she feels.  She is having a hard time doing anything, even sleeping.  She is very fatigued.    For over one month she has had lower back pain.  The pain is more in the right lower back.  She was getting catches of pain that was making her hold her breath.  She started to feel pressure in that area.  She was seen at [facility] and they thought it was a muscle spasm.  Nothing is helping and she does not feel it is a muscle spasm.  She is having some numbness and tingling in her right leg.  She denies pain in the leg.    She has a pinching sensation in her neck and tingling/pin sensation in her hands and left arm.  She has had neck surgery in the past.  She is concerned about MS - she has seen more than one neurologist in the past and they do not think she has it.  She thinks she has it and when the copaxone was discontinued she felt worse.      B12 deficiency:  She is coming for monthly B12 injections.  She wondered if some of her symptoms were related to a low B12.    Hypertension: She is taking her medication daily. She is compliant with a low sodium diet.         Medications and allergies reviewed with patient and updated if appropriate.  Patient Active Problem List   Diagnosis Date Noted  . Sleep difficulties 07/15/2015  . Bunion, right 06/20/2015  . Callus of foot 04/27/2015  . Muscle spasm 04/16/2015  . B12 deficiency 03/30/2015  . Hyperglycemia 03/30/2015  . Hammer toe of left foot 02/16/2015  . Fibrosis of skin of lower extremity 02/16/2015  . Status post right foot surgery 10/26/2014  . Cervical spondylosis with radiculopathy 03/01/2014  . Hammer toe of right foot 01/26/2014  . Benign intracranial hypertension 06/18/2013  . Pain in lower limb 05/22/2013  . Abnormal gait  04/21/2013  . Arthritis 04/21/2013  . Disturbance of skin sensation 04/21/2013  . Cephalalgia 04/21/2013  . Porokeratosis 03/31/2013  . Pain, foot 03/31/2013  . Vitamin D deficiency 04/18/2010  . FATIGUE 04/14/2010  . BACK PAIN WITH RADICULOPATHY 11/29/2008  . MEDIAL MENISCUS TEAR, LEFT 06/30/2008  . BARRETTS ESOPHAGUS 06/28/2008  . OSTEOARTHRITIS, KNEES, BILATERAL 06/28/2008  . OBESITY 05/24/2008  . HYPERTENSION, BENIGN ESSENTIAL 05/24/2008  . IRRITABLE BOWEL SYNDROME 05/24/2008  . BARIATRIC SURGERY STATUS 03/19/2006  . Multiple sclerosis ([place]) 03/19/2002  . NEOPLASM, MALIGNANT, COLON, HX OF 03/19/1997    Current Outpatient Prescriptions on File Prior to Visit  Medication Sig Dispense Refill  . aspirin 81 MG tablet Take 81 mg by mouth daily.    . Cyanocobalamin (B-12) 1000 MCG/ML KIT Inject 1,000 mcg as directed every 30 (thirty) days. 1 kit 6  . cyclobenzaprine (FLEXERIL) 10 MG tablet Take 1 tablet (10 mg total) by mouth 3 (three) times daily as needed for muscle spasms (do not take any day that you take tizanidine). 30 tablet 0  . doxycycline (MONODOX) 100 MG capsule Take 1 capsule (100 mg total) by mouth 2 (two) times daily. 10 capsule 0  . hydrochlorothiazide (HYDRODIURIL) 25 MG tablet Take 0.5  tablets (12.5 mg total) by mouth daily. PATIENT NEEDS OFFICE VISIT FOR ADDITIONAL REFILLS 15 tablet 0  . lisinopril (PRINIVIL,ZESTRIL) 20 MG tablet Take 1 tablet (20 mg total) by mouth daily. 90 tablet 3  . tiZANidine (ZANAFLEX) 2 MG tablet Take 1 tablet (2 mg total) by mouth every 8 (eight) hours as needed for muscle spasms. 90 tablet 3  . traMADol (ULTRAM) 50 MG tablet Take 1 tablet (50 mg total) by mouth every 12 (twelve) hours as needed. 10 tablet 0  . traZODone (DESYREL) 50 MG tablet Take 1-2 tablets (50-100 mg total) by mouth at bedtime as needed for sleep. 60 tablet 3  . VENTOLIN HFA 108 (90 Base) MCG/ACT inhaler Inhale 1 puff into the lungs every 6 (six) hours as needed for wheezing  or shortness of breath. 1 Inhaler 2  . Vitamin D, Ergocalciferol, (DRISDOL) 50000 units CAPS capsule Take 1 capsule (50,000 Units total) by mouth every 7 (seven) days. Sunday 12 capsule 1   Current Facility-Administered Medications on File Prior to Visit  Medication Dose Route Frequency Provider Last Rate Last Dose  . cyanocobalamin ((VITAMIN B-12)) injection 1,000 mcg  1,000 mcg Intramuscular Weekly [name], MD   1,000 mcg at 06/03/15 1435  . cyanocobalamin ((VITAMIN B-12)) injection 1,000 mcg  1,000 mcg Intramuscular Q30 days [name], MD   1,000 mcg at 07/15/15 1410    Past Medical History  Diagnosis Date  . Hypertension   . MS (multiple sclerosis) ([place])   . Colon cancer ([place])   . Back pain   . Umbilical hernia   . Other hammer toe (acquired) 03/31/2013  . Pneumonia     hx of  . GERD (gastroesophageal reflux disease)   . Diarrhea   . Arthritis   . Insomnia     Past Surgical History  Procedure Laterality Date  . Colon surgery    . Cesarean section    . Foot surgery Right   . Abdominal hysterectomy    . Colonoscopy    . Gastric bypass    . Anterior cervical decomp/discectomy fusion N/A 03/01/2014    Procedure: ANTERIOR CERVICAL DECOMPRESSION/DISCECTOMY FUSION 1 LEVEL;  Surgeon: Charlie Pitter, MD;  Location: [facility];  Service: Neurosurgery;  Laterality: N/A;  ANTERIOR CERVICAL DECOMPRESSION/DISCECTOMY FUSION 1 LEVEL  . Bunionectomy Right 10/14/2014    @[facility]   . Hammer toe repair Right 10/14/2014    RT #2, @[facility]   . Tenotomy Right 10/14/2014    RT #3, @[facility]     Social History   Social History  . Marital Status: Single    Spouse Name: N/A  . Number of Children: N/A  . Years of Education: N/A   Social History Main Topics  . Smoking status: Current Some Day Smoker -- 0.25 packs/day for 20 years    Types: Cigarettes  . Smokeless tobacco: Never Used  . Alcohol Use: 0.0 oz/week    0 Standard drinks or equivalent per week     Comment: 1/2 pint daily  . Drug Use:  No  . Sexual Activity:    Partners: Male   Other Topics Concern  . Not on file   Social History Narrative    Family History  Problem Relation Age of Onset  . Stroke Mother   . Hypertension Mother   . Hyperlipidemia Mother   . Diabetes Mother   . Diabetes Brother   . Hypertension Brother   . Hypertension Brother   . Hypertension Brother   . Hypertension Brother   .  Hypertension Brother   . Alcoholism Brother     Review of Systems  Constitutional: Positive for appetite change (decreased) and fatigue. Negative for fever and chills.  HENT:       Pain and fatigue with chewing  Eyes: Negative for visual disturbance.  Respiratory: Positive for shortness of breath. Negative for cough and wheezing.   Cardiovascular: Negative for chest pain, palpitations and leg swelling.  Gastrointestinal: Positive for diarrhea.  Musculoskeletal: Positive for myalgias (leg weakness), back pain, gait problem and neck pain.  Neurological: Positive for dizziness, light-headedness, numbness and headaches.       Objective:   Filed Vitals:   10/04/15 1636  BP: 126/72  Pulse: 95  Temp: 98 F (36.7 C)  Resp: 16   Filed Weights   10/04/15 1636  Weight: 237 lb (107.502 kg)   Body mass index is 37.11 kg/(m^2).   Physical Exam  Constitutional: She appears well-developed and well-nourished.  In mild discomfort  HENT:  Head: Normocephalic and atraumatic.  Neck: No tracheal deviation present. No thyromegaly present.  Cardiovascular: Normal rate and regular rhythm.   Pulmonary/Chest: Effort normal and breath sounds normal. No respiratory distress. She has no wheezes. She has no rales.  Musculoskeletal: She exhibits no edema.  Right lower back pain with palpation, mild lumbar tenderness  Lymphadenopathy:    She has no cervical adenopathy.  Skin: She is not diaphoretic.          Assessment & Plan:   See Problem List for Assessment and Plan of chronic medical problems.

## 2015-10-04 NOTE — Progress Notes (Signed)
Pre visit review using our clinic review tool, if applicable. No additional management support is needed unless otherwise documented below in the visit note. 

## 2015-10-05 ENCOUNTER — Encounter: Payer: Self-pay | Admitting: Internal Medicine

## 2015-10-05 ENCOUNTER — Other Ambulatory Visit (INDEPENDENT_AMBULATORY_CARE_PROVIDER_SITE_OTHER): Payer: Medicare Other

## 2015-10-05 DIAGNOSIS — M6281 Muscle weakness (generalized): Secondary | ICD-10-CM | POA: Diagnosis not present

## 2015-10-05 DIAGNOSIS — M5412 Radiculopathy, cervical region: Secondary | ICD-10-CM | POA: Diagnosis not present

## 2015-10-05 DIAGNOSIS — R5382 Chronic fatigue, unspecified: Secondary | ICD-10-CM | POA: Diagnosis not present

## 2015-10-05 DIAGNOSIS — R739 Hyperglycemia, unspecified: Secondary | ICD-10-CM

## 2015-10-05 DIAGNOSIS — M545 Low back pain, unspecified: Secondary | ICD-10-CM

## 2015-10-05 DIAGNOSIS — E538 Deficiency of other specified B group vitamins: Secondary | ICD-10-CM

## 2015-10-05 DIAGNOSIS — M542 Cervicalgia: Secondary | ICD-10-CM

## 2015-10-05 DIAGNOSIS — G8929 Other chronic pain: Secondary | ICD-10-CM

## 2015-10-05 LAB — CBC WITH DIFFERENTIAL/PLATELET
Basophils Absolute: 0 10*3/uL (ref 0.0–0.1)
Basophils Relative: 0.3 % (ref 0.0–3.0)
Eosinophils Absolute: 0.1 10*3/uL (ref 0.0–0.7)
Eosinophils Relative: 2.1 % (ref 0.0–5.0)
HCT: 36.5 % (ref 36.0–46.0)
Hemoglobin: 12 g/dL (ref 12.0–15.0)
Lymphocytes Relative: 25.8 % (ref 12.0–46.0)
Lymphs Abs: 1.4 10*3/uL (ref 0.7–4.0)
MCHC: 32.9 g/dL (ref 30.0–36.0)
MCV: 93.1 fl (ref 78.0–100.0)
Monocytes Absolute: 0.4 10*3/uL (ref 0.1–1.0)
Monocytes Relative: 8 % (ref 3.0–12.0)
Neutro Abs: 3.5 10*3/uL (ref 1.4–7.7)
Neutrophils Relative %: 63.8 % (ref 43.0–77.0)
Platelets: 190 10*3/uL (ref 150.0–400.0)
RBC: 3.92 Mil/uL (ref 3.87–5.11)
RDW: 13.9 % (ref 11.5–15.5)
WBC: 5.4 10*3/uL (ref 4.0–10.5)

## 2015-10-05 LAB — C-REACTIVE PROTEIN: CRP: 0.2 mg/dL — ABNORMAL LOW (ref 0.5–20.0)

## 2015-10-05 LAB — COMPREHENSIVE METABOLIC PANEL
ALT: 18 U/L (ref 0–35)
AST: 28 U/L (ref 0–37)
Albumin: 4 g/dL (ref 3.5–5.2)
Alkaline Phosphatase: 84 U/L (ref 39–117)
BUN: 58 mg/dL — ABNORMAL HIGH (ref 6–23)
CO2: 17 mEq/L — ABNORMAL LOW (ref 19–32)
Calcium: 9.8 mg/dL (ref 8.4–10.5)
Chloride: 108 mEq/L (ref 96–112)
Creatinine, Ser: 1.75 mg/dL — ABNORMAL HIGH (ref 0.40–1.20)
GFR: 38.88 mL/min — ABNORMAL LOW (ref >60.00)
Glucose, Bld: 118 mg/dL — ABNORMAL HIGH (ref 70–99)
Potassium: 4.9 mEq/L (ref 3.5–5.1)
Sodium: 135 mEq/L (ref 135–145)
Total Bilirubin: 0.6 mg/dL (ref 0.2–1.2)
Total Protein: 7.6 g/dL (ref 6.0–8.3)

## 2015-10-05 LAB — SEDIMENTATION RATE: Sed Rate: 20 mm/hr (ref 0–30)

## 2015-10-05 LAB — TSH: TSH: 0.82 u[IU]/mL (ref 0.35–4.50)

## 2015-10-05 LAB — HEMOGLOBIN A1C: Hgb A1c MFr Bld: 5.8 % (ref 4.6–6.5)

## 2015-10-05 LAB — VITAMIN B12: Vitamin B-12: 991 pg/mL — ABNORMAL HIGH (ref 211–911)

## 2015-10-05 NOTE — Assessment & Plan Note (Signed)
Will refer to orthopedics for further evaluation of neck and back May need pain management D/c tramadol Start vicodin 5-325mg   Follow up in 2 weeks

## 2015-10-05 NOTE — Assessment & Plan Note (Signed)
Check a1c 

## 2015-10-05 NOTE — Assessment & Plan Note (Signed)
Likely multifactorial Check labs Will work on improving pain and hopefully sleep will improve F/u in 2 weeks

## 2015-10-05 NOTE — Assessment & Plan Note (Signed)
Continue monthly B12 injections Check B12 level

## 2015-10-05 NOTE — Assessment & Plan Note (Signed)
Will refer to ortho / Dr Tamala Julian Likely needs imaging Will also be seeing neuro

## 2015-10-05 NOTE — Assessment & Plan Note (Signed)
BP well controlled Current regimen effective and well tolerated Continue current medications at current doses  

## 2015-10-11 ENCOUNTER — Encounter: Payer: Self-pay | Admitting: Neurology

## 2015-10-11 ENCOUNTER — Ambulatory Visit (INDEPENDENT_AMBULATORY_CARE_PROVIDER_SITE_OTHER): Payer: Medicare Other | Admitting: Neurology

## 2015-10-11 VITALS — Ht 66.5 in | Wt 227.0 lb

## 2015-10-11 DIAGNOSIS — R202 Paresthesia of skin: Secondary | ICD-10-CM

## 2015-10-11 DIAGNOSIS — M62838 Other muscle spasm: Secondary | ICD-10-CM

## 2015-10-11 DIAGNOSIS — G8929 Other chronic pain: Secondary | ICD-10-CM

## 2015-10-11 DIAGNOSIS — R200 Anesthesia of skin: Secondary | ICD-10-CM

## 2015-10-11 NOTE — Patient Instructions (Signed)
I don't think you have MS.  Unfortunately, I do not have a neurologic explanation for your symptoms.  I think you may need to treat your symptoms, such as pain control and physical therapy.

## 2015-10-11 NOTE — Progress Notes (Signed)
[name] [place], [place] [address] Phone: [phone] Subjective:    I'm seeing this patient by the request  of:  [name], MD   CC: Neck pain follow-up  [id number]  Misty [name] is a 55 y.o. female coming in with complaint of neck pain. Patient past medical history is significant for multiple sclerosis  But I did review patient's most recent notes and was seen by neurology who states that she does not have multiple sclerosis.this is also confirmed by second opinion by [facility] recently.patient is not seen me for greater than 5 months.    Patient also had history of cervical neck arthritis status post C5-C6 fusion. Patient states though that her neck pain seems to be worsening over the course of time.  Patient states that she does have spasming in the right upper extremity as well as the left upper extremity and sometimes her hands and feet. Has fallen on a couple occasions. Patient did see primary care provider and most recent labs were unremarkable except for an elevated creatinine at 1.47. Patient denies taking any anti-inflammatories on a regular basis.  Continues to have the same paresthesia of the arms bilaterally taking B12 patient now being in the normal range. Still having the same difficulties.Still having the spasming.    Patient is having more spasms in her right foot. Patient did have surgery on her right foot for bunionectomy. Has had 2 different surgeries on it. Since the surgery she has noticed more numbness in the foot. Ppatient did have some debridement of the callus as well as to wear some different shoes and over-the-counter orthotics.  Patient did have removal of excessive callus formation previously. Patient states  The pain seems to be constant. No new symptoms such as swelling. States though that does have the cramping spasming.   Reviewed patient's chart Onset: 2004. Marland Kitchen She saw Dr. Jacolyn Reedy at [facility] who  performed MRI of the brain with and without contrast, performed 11/05/02, which revealed nonspecific rare subtle white matter hyperintensities in the subcortical white matter. She underwent a lumbar puncture on 11/19/02. She had an opening pressure of 23 cm H2O. CSF did show oligoclonal bands   MRI of the brain with and without contrast performed 12/26/06 revealed multiple hyperintensities in the subcortical white matter with no abnormal enhancement.  cerebellar tonsils were 4-5 mm below the foramen magnum.   MRI of the brain with and without contrast performed 12/24/12 revealed again nonspecific periventricular, subcortical and juxtacortical hyperintensities with no abnormal enhancement. .   She had a follow-up MRI of the brain with and without contrast performed on 08/20/13, which revealed stable non-enhancing non-specific white matter hyperintensities when compared to prior study from 12/22/13.  She was complaining of increased pain and tingling involving the left arm and hand, so MRI of the cervical spine was performed on 12/18/13, which revealed disc protrusion affecting the left C6 nerve root. She underwent anterior cervical discectomy and fusion on 03/01/14 performed by Dr. Earnie Larsson.  Most recent MRI of the brain in May 2016 showed patient having some mild microvascular ischemia seems to be chronic in nature.   Past Medical History:  Diagnosis Date  . Arthritis   . Back pain   . Colon cancer ([place])   . Diarrhea   . GERD (gastroesophageal reflux disease)   . Hypertension   . Insomnia   . MS (multiple sclerosis) ([place])   . Other hammer toe (acquired) 03/31/2013  .  Pneumonia    hx of  . Umbilical hernia    Past Surgical History:  Procedure Laterality Date  . ABDOMINAL HYSTERECTOMY    . ANTERIOR CERVICAL DECOMP/DISCECTOMY FUSION N/A 03/01/2014   Procedure: ANTERIOR CERVICAL DECOMPRESSION/DISCECTOMY FUSION 1 LEVEL;  Surgeon: Charlie Pitter, MD;  Location: [facility];  Service:  Neurosurgery;  Laterality: N/A;  ANTERIOR CERVICAL DECOMPRESSION/DISCECTOMY FUSION 1 LEVEL  . BUNIONECTOMY Right 10/14/2014   @[facility]   . CESAREAN SECTION    . COLON SURGERY    . COLONOSCOPY    . FOOT SURGERY Right   . GASTRIC BYPASS    . Hammer Toe Repair Right 10/14/2014   RT #2, @[facility]   . TENOTOMY Right 10/14/2014   RT #3, @[facility]    Social History   Social History  . Marital status: Single    Spouse name: N/A  . Number of children: N/A  . Years of education: N/A   Social History Main Topics  . Smoking status: Current Some Day Smoker    Packs/day: 0.25    Years: 20.00    Types: Cigarettes  . Smokeless tobacco: Never Used  . Alcohol use 0.0 oz/week     Comment: 1/2 pint daily  . Drug use: No  . Sexual activity: Yes    Partners: Male   Other Topics Concern  . Not on file   Social History Narrative  . No narrative on file   Allergies  Allergen Reactions  . Phenergan [Promethazine Hcl] Anxiety  . Promethazine Anxiety   Family History  Problem Relation Age of Onset  . Stroke Mother   . Hypertension Mother   . Hyperlipidemia Mother   . Diabetes Mother   . Diabetes Brother   . Hypertension Brother   . Hypertension Brother   . Hypertension Brother   . Hypertension Brother   . Hypertension Brother   . Alcoholism Brother     Past medical history, social, surgical and family history all reviewed in electronic medical record.  No pertanent information unless stated regarding to the chief complaint.   Review of Systems: No headache, visual changes, nausea, vomiting, diarrhea, constipation, dizziness, abdominal pain, skin rash, fevers, chills, night sweats, weight loss, swollen lymph nodes, body aches, joint swelling, muscle aches, chest pain, shortness of breath, mood changes.   Objective  There were no vitals taken for this visit.  General: No apparent distress alert and oriented x3 mood and affect normal, dressed appropriately.  HEENT: Pupils equal, extraocular movements  intact  Respiratory: Patient's speak in full sentences and does not appear short of breath  Cardiovascular: No lower extremity edema, non tender, no erythema  Skin: Warm dry intact with no signs of infection or rash on extremities or on axial skeleton.  Abdomen: Soft tender with patient having some possible small umbilical hernia noted Neuro: Cranial nerves II through XII are intact, neurovascularly intact in all extremities with 1+ DTRs but symmetric and 2+ pulses.  Lymph: No lymphadenopathy of posterior or anterior cervical chain or axillae bilaterally.  Gait antalgic gait still noted MSK:  Non tender with full range of motion and good stability and symmetric strength and tone of shoulders, elbows, wrist, hip, knee and ankles bilaterally.  Neck: Inspection unremarkable. Incision well-healed No palpable stepoffs. Positive Spurling's maneuver but seems to be bilateral. Limited in range of motion lacking the last 5 of flexion and the last 10 of extension Grip strength 4 out of 5 but seems to be symmetric today compared to 5 out of 5  strength on the right side. Patient does have mild atrophy of the thenar eminence bilaterally Strength good C4 to T1 distribution when tested individually No sensory change to C4 to T1 Negative Hoffman sign bilaterally Reflexes normal Minimal changes from previous exam.   Impression and Recommendations:     This case required medical decision making of moderate complexity.      Note: This dictation was prepared with Dragon dictation along with smaller phrase technology. Any transcriptional errors that result from this process are unintentional.

## 2015-10-11 NOTE — Progress Notes (Signed)
NEUROLOGY FOLLOW UP OFFICE NOTE  Misty [name] [id number]  HISTORY OF PRESENT ILLNESS: Misty [name] is a 83 right year old right-handed AA woman with history of MS, idiopathic intracranial hypertension, gastric bypass who follows up for multiple sclerosis.  UPDATE: Based on her history, I didn't think she had MS.  I referred her to the [place] clinic at [facility] for their opinion.  Their assessment was that she did not have MS. She was found to have B12 level of 65 and has started shots.  Their assessment was that she did not have MS and Copaxone was discontinued.  After Copaxone was discontinued, she almost immediately began noticing a decline.  She has had neck pain, painful paresthesia of the arms (left greater than right), and cramping.  She has history of cervical arthritis status post C5-6 fusion.  She is experiencing spasms in her right foot with increased callus formation.  She has history of bunionectomy.  She has noticed no improvement since starting B12 shots.  She continues to have spasms in the hands and feet.  She also reports dizziness, abdominal discomfort and fatigue.  She did have NCV-EMG performed on 05/17/15 of the right lower extremity, for persistent numbness, pain and weakness following bunionectomy, which was normal.  She was evaluated by Dr. Kathlen Mody of ophthalmology on 04/29/15 which revealed no evidence of papilledema.  Labs from this month include B12 991, sed rate 20, CRP 0.2, TSH 0.82, Hgb A1c 5.8, BUN 58, Cr 1.75, normal CBC and LFTs.  Repeat B12 was 991.    HISTORY: In 2004, she began experiencing dizziness, intermittent tingling, intermittent blurred vision and headache.  The dizzy spells would come on suddenly and even cause her to fall.  She experiences daily headaches, described as bi-temporal sharp pain worse when bending forward.  There is no associated nausea, vomiting or photophobia.  She also feels clumsy and drops things.  She saw Dr. Jacolyn Reedy at [facility] who  performed MRI of the brain with and without contrast, performed 11/05/02, which revealed nonspecific rare subtle white matter hyperintensities in the subcortical white matter.  She underwent a lumbar puncture on 11/19/02.  She had an opening pressure of 23 cm H2O.  CSF reportedly showed oligoclonal bands, but the actual results are not available to me.  She was diagnosed with MS.  Initially she was on Betaseron for 6 months.  It was discontinued because of increased liver enzymes.  She was then started on Copaxone, which she took for 4 or 5 years.  She discontinued it several years ago due to loss of insurance, but was subsequently restarted on it a couple of years ago.  Due to the headaches and elevated opening CSF pressure, there was concern for idiopathic intracranial hypertension.  She was subsequently started on acetazolamide but has since been discontinued for some time.  Over the years, she has had repeat testing.  MRIs of the brain with and without contrast have been performed several times over the years (12/26/06, 06/17/07, 12/24/12, 07/21/14) revealing multiple hyperintensities in the subcortical white matter with no abnormal enhancement, more consistent with chronic small vessel ischemic changes, and have been stable.  MRI of the cervical spine with and without contrast from 05/2007 showed no cord lesions.  She had VEP performed on 01/29/13, which was normal.  Repeat Lumbar puncture was performed on 02/03/13.   Opening pressure was 24.5 cm H2O.  CSF revealed cell count 0, glucose 57, protein 26, 2 oligoclonal bands, IgG index 0.6, myelin  basic protein less than 2.0.  She was complaining of increased pain and tingling involving the left arm and hand, so MRI of the cervical spine was performed on 12/18/13, which revealed disc protrusion affecting the left C6 nerve root.  She underwent anterior cervical discectomy and fusion on 03/01/14 performed by Dr. Earnie Larsson.  Based on history, she has never had an actual  'flare-up" and has never required corticosteroids in the past.  She has had chronic symptoms over the years: Diffuse weakness.  Reports that she has had falls because her legs feel weak. Fatigue.  Used to be on Provigil.  Muscle spasms and myalgias.  Takes cyclobenzaprine, which helps. Dizziness.  Takes tramadol which helps. Blurred vision No incontinence. Most troublesome are burning painful paresthesias involving the arms (left more than right) and occasionally the right leg.  She also experiences shooting pain down the back.  She tried gabapentin, which was ineffective.  Previously, she tried amitriptyline, which caused hallucinations.  She takes Lyrica and Cymbalta Disease-modifying agent:  Copaxone.  Previously:  Betaseron (side effects) Past medications:  gabapentin (felt goofy), Cymbalta (ineffective), amitriptyline (hallucinations) Current medications:  cyclobenzaprine 1m at bedtime, vitamin D, Lyrica 740mtwice daily  PAST MEDICAL HISTORY: Past Medical History:  Diagnosis Date  . Arthritis   . Back pain   . [place]  . Diarrhea   . GERD (gastroesophageal reflux disease)   . Hypertension   . Insomnia   . [place]) [place]  . Other hammer toe (acquired) 03/31/2013  . Pneumonia    hx of  . Umbilical hernia     MEDICATIONS: Current Outpatient Prescriptions on File Prior to Visit  Medication Sig Dispense Refill  . aspirin 81 MG tablet Take 81 mg by mouth daily.    . Cyanocobalamin (B-12) 1000 MCG/ML KIT Inject 1,000 mcg as directed every 30 (thirty) days. 1 kit 6  . cyclobenzaprine (FLEXERIL) 10 MG tablet Take 1 tablet (10 mg total) by mouth 3 (three) times daily as needed for muscle spasms (do not take any day that you take tizanidine). 30 tablet 0  . hydrochlorothiazide (HYDRODIURIL) 25 MG tablet Take 0.5 tablets (12.5 mg total) by mouth daily. PATIENT NEEDS OFFICE VISIT FOR ADDITIONAL REFILLS 15 tablet 0  . HYDROcodone-acetaminophen (NORCO/VICODIN)  5-325 MG tablet Take 1 tablet by mouth every 6 (six) hours as needed for moderate pain. 30 tablet 0  . lisinopril (PRINIVIL,ZESTRIL) 20 MG tablet Take 1 tablet (20 mg total) by mouth daily. 90 tablet 3  . tiZANidine (ZANAFLEX) 2 MG tablet Take 1 tablet (2 mg total) by mouth every 8 (eight) hours as needed for muscle spasms. 90 tablet 3  . traZODone (DESYREL) 50 MG tablet Take 1-2 tablets (50-100 mg total) by mouth at bedtime as needed for sleep. 60 tablet 3  . VENTOLIN HFA 108 (90 Base) MCG/ACT inhaler Inhale 1 puff into the lungs every 6 (six) hours as needed for wheezing or shortness of breath. 1 Inhaler 2  . Vitamin D, Ergocalciferol, (DRISDOL) 50000 units CAPS capsule Take 1 capsule (50,000 Units total) by mouth every 7 (seven) days. Sunday 12 capsule 1   Current Facility-Administered Medications on File Prior to Visit  Medication Dose Route Frequency Provider Last Rate Last Dose  . cyanocobalamin ((VITAMIN B-12)) injection 1,000 mcg  1,000 mcg Intramuscular Weekly StBinnie RailMD   1,000 mcg at 06/03/15 1435  . cyanocobalamin ((VITAMIN B-12)) injection 1,000 mcg  1,000 mcg Intramuscular Q30 days StClaudina Lick  Burns, MD   1,000 mcg at 07/15/15 1410    ALLERGIES: Allergies  Allergen Reactions  . Phenergan [Promethazine Hcl] Anxiety  . Promethazine Anxiety    FAMILY HISTORY: Family History  Problem Relation Age of Onset  . Stroke Mother   . Hypertension Mother   . Hyperlipidemia Mother   . Diabetes Mother   . Diabetes Brother   . Hypertension Brother   . Hypertension Brother   . Hypertension Brother   . Hypertension Brother   . Hypertension Brother   . Alcoholism Brother     SOCIAL HISTORY: Social History   Social History  . Marital status: Single    Spouse name: N/A  . Number of children: N/A  . Years of education: N/A   Occupational History  . Not on file.   Social History Main Topics  . Smoking status: Current Some Day Smoker    Packs/day: 0.25    Years: 20.00     Types: Cigarettes  . Smokeless tobacco: Never Used  . Alcohol use 0.0 oz/week     Comment: 1/2 pint daily  . Drug use: No  . Sexual activity: Yes    Partners: Male   Other Topics Concern  . Not on file   Social History Narrative  . No narrative on file    REVIEW OF SYSTEMS: Constitutional: fatigue Eyes: No visual changes, double vision, eye pain Ear, nose and throat: No hearing loss, ear pain, nasal congestion, sore throat Cardiovascular: No chest pain, palpitations Respiratory:  No shortness of breath at rest or with exertion, wheezes GastrointestinaI: abdominal pain Genitourinary:  No dysuria, urinary retention or frequency Musculoskeletal:  Spasms in hands and foot. Integumentary: No rash, pruritus, skin lesions Neurological: as above Psychiatric: No depression, insomnia, anxiety Endocrine: fatigue Hematologic/Lymphatic:  No purpura, petechiae. Allergic/Immunologic: no itchy/runny eyes, nasal congestion, recent allergic reactions, rashes  PHYSICAL EXAM: Vitals:  BP 120/48, Pulse 98.  Orthostatics negative. General: No acute distress.   Head:  Normocephalic/atraumatic Eyes:  Fundi examined but not visualized Neck: supple, no paraspinal tenderness, full range of motion Heart:  Regular rate and rhythm Lungs:  Clear to auscultation bilaterally Back: No paraspinal tenderness Neurological Exam: alert and oriented to person, place, and time. Attention span and concentration intact, recent and remote memory intact, fund of knowledge intact.  Speech fluent and not dysarthric, language intact.  CN II-XII intact. Bulk and tone normal, muscle strength 5/5 throughout.  Sensation to light touch, temperature and vibration intact.  Deep tendon reflexes 2+ throughout, toes downgoing.  Finger to nose and heel to shin testing intact.  Gait normal, Romberg negative.  IMPRESSION: 1 My assessment is still that she does not have multiple sclerosis or other demyelinating disease.  She does not  meet diagnosis based on 2010 McDonald Criteria.  She describes chronic subjective symptoms but no history of actual clinical localized attack/flare-up.  MRI findings show white matter changes which are not characteristic for MS, without evidence of dissemination in space, and have been stable over several years.  Although CSF was positive for oligoclonal bands, it was not a significant number.  For a second opinion, I had her evaluated by a specialist in the [facility] at [facility], who also concurred that she does not have a demyelinating disease 2 At this time, she does not have idiopathic intracranial hypertension.  Opening pressure was unremarkable and there is no evidence of papilledema on fundoscopic exam.  3 Dizziness, paresthesias, muscle spasms.  Orthostatics were negative.  NCV-EMG  did not reveal evidence of nerve or muscle disease.  She reports symptoms have not improved since B12 supplementation.  I have no neurologic explanation for symptoms, which may be psychosomatic, as she also endorses other somatic symptoms such as fatigue and abdominal discomfort with decreased appetite.  28 minutes spent face to face with patient, over 50% spent counseling.    Metta Clines, DO  CC:  Billey Gosling, MD

## 2015-10-12 ENCOUNTER — Encounter: Payer: Self-pay | Admitting: Family Medicine

## 2015-10-12 ENCOUNTER — Ambulatory Visit (INDEPENDENT_AMBULATORY_CARE_PROVIDER_SITE_OTHER): Payer: Medicare Other | Admitting: Family Medicine

## 2015-10-12 VITALS — BP 92/60 | HR 96 | Wt 226.0 lb

## 2015-10-12 DIAGNOSIS — G894 Chronic pain syndrome: Secondary | ICD-10-CM | POA: Diagnosis not present

## 2015-10-12 DIAGNOSIS — I10 Essential (primary) hypertension: Secondary | ICD-10-CM

## 2015-10-12 DIAGNOSIS — K429 Umbilical hernia without obstruction or gangrene: Secondary | ICD-10-CM | POA: Diagnosis not present

## 2015-10-12 DIAGNOSIS — M4722 Other spondylosis with radiculopathy, cervical region: Secondary | ICD-10-CM | POA: Diagnosis not present

## 2015-10-12 NOTE — Patient Instructions (Signed)
I am sorry you are hurting.  I do want you to maybe either 1/2 the HCTZ or stop it.  Monitor blood pressure daily and if top number stay above 140 for 3 days please start the medicine again , but this could be causing the dizziness.  We will get you into pain medicine to see if they help We will need to have your kidneys checked again so please keep your appointment with Dr. Quay Burow.  I am here if you have questions but I am sory I do not have a good reason for your pain at the moment.

## 2015-10-12 NOTE — Assessment & Plan Note (Signed)
It is unfortunate but we have not been able to find anything specific at this time. Patient's B12 deficiency has resolved from still having the same problems. Has had a history of gastric bypass impossible and malabsorption problem. Patient has been worked up for multiple sclerosis but no significant findings at this time the patient is very frustrated at the next signals that she is receiving. Discussed with her at this time I do not see any neck signals and she does not have a neurovascular disease that we can diagnose at this moment. Encourage patient to return to formal physical therapy which patient declined. Patient is wondering if she can talk to someone else at this moment. Patient feels like pain medications has helped in the past but I do not feel comfortable giving this to her to feel like further evaluation by a pain management center may be the best bet. Patient has agreed to this. Patient was referred today. Discussed with patient again at great length.patient did state she feels that no one is pain attention at this point.discussed with patient that we can repeat autoimmune labs but were unremarkable previously. Patient at that point was ready to end visit.

## 2015-10-12 NOTE — Progress Notes (Signed)
Pre visit review using our clinic review tool, if applicable. No additional management support is needed unless otherwise documented below in the visit note. 

## 2015-10-12 NOTE — Assessment & Plan Note (Signed)
BP Readings from Last 3 Encounters:  10/12/15 92/60  10/04/15 126/72  09/12/15 110/72  patient has been running low for quite some time. Encourage her to have her hydrochlorothiazide in case this is causing some of the kidney dysfunction. Patient is following up with primary care again in the next week. Wilson no to her about the possible change in medication.

## 2015-10-12 NOTE — Assessment & Plan Note (Signed)
Patient does have a very small abdominal defect all just to the right of the umbilical area. Minimal tenderness today. Discussed with patient to follow-up with primary care before referral to general surgery. No bowel or bladder changes significantly but does have chronic abdominal pain.

## 2015-10-12 NOTE — Assessment & Plan Note (Addendum)
Discussed with patient I do some of her neck pain seems to be more of a cervical spondylosis with some radicular symptoms. Patient describes it more as a spasm subjectively. Do not see any signs or symptoms at this time. Very minimal thenar eminence wasting. Patient has seen other specialists to do not feel that this is neurologic in nature. Patient previously was having more of a weakness of the left upper extremity that seems now to be more symmetric. Patient's strength testing seemed to come and go today. Seem like there was some voluntarily weakness with testing today. Discussed with patient having a fusion in her neck if she wanted further evaluation she should follow-up with her surgeon.

## 2015-10-13 ENCOUNTER — Telehealth: Payer: Self-pay | Admitting: Emergency Medicine

## 2015-10-13 DIAGNOSIS — N289 Disorder of kidney and ureter, unspecified: Secondary | ICD-10-CM

## 2015-10-13 NOTE — Telephone Encounter (Signed)
BMP ordered, pt notified.

## 2015-10-14 ENCOUNTER — Ambulatory Visit: Payer: Medicare Other | Admitting: Podiatry

## 2015-10-14 ENCOUNTER — Ambulatory Visit: Payer: Medicare Other | Admitting: Neurology

## 2015-10-18 ENCOUNTER — Other Ambulatory Visit (INDEPENDENT_AMBULATORY_CARE_PROVIDER_SITE_OTHER): Payer: Medicare Other

## 2015-10-18 DIAGNOSIS — N289 Disorder of kidney and ureter, unspecified: Secondary | ICD-10-CM

## 2015-10-18 LAB — BASIC METABOLIC PANEL
BUN: 32 mg/dL — ABNORMAL HIGH (ref 6–23)
CO2: 20 mEq/L (ref 19–32)
Calcium: 9.6 mg/dL (ref 8.4–10.5)
Chloride: 109 mEq/L (ref 96–112)
Creatinine, Ser: 1.14 mg/dL (ref 0.40–1.20)
GFR: 63.75 mL/min (ref >60.00)
Glucose, Bld: 93 mg/dL (ref 70–99)
Potassium: 5 mEq/L (ref 3.5–5.1)
Sodium: 137 mEq/L (ref 135–145)

## 2015-10-20 ENCOUNTER — Encounter: Payer: Medicare Other | Admitting: Internal Medicine

## 2015-10-20 NOTE — Progress Notes (Signed)
Subjective:    Patient ID: Misty [name], female    DOB: [birth year], 55 y.o.   MRN: [id number]  HPI She is here for follow up.  Chronic pain syndrome:  She saw Dr Tamala Julian recently and he has referred her to pain management.  She does not want to do PT again.  There is no evidence of multiple sclerosis.  She does have some cervical spondylosis that may be contributing to some of her pain.   Medications and allergies reviewed with patient and updated if appropriate.  Patient Active Problem List   Diagnosis Date Noted  . Chronic pain syndrome 10/12/2015  . Umbilical hernia 80/05/4915  . Sleep difficulties 07/15/2015  . Bunion, right 06/20/2015  . Callus of foot 04/27/2015  . Muscle spasm 04/16/2015  . B12 deficiency 03/30/2015  . Hyperglycemia 03/30/2015  . Hammer toe of left foot 02/16/2015  . Fibrosis of skin of lower extremity 02/16/2015  . Status post right foot surgery 10/26/2014  . Cervical spondylosis with radiculopathy 03/01/2014  . Hammer toe of right foot 01/26/2014  . Benign intracranial hypertension 06/18/2013  . Pain in lower limb 05/22/2013  . Abnormal gait 04/21/2013  . Arthritis 04/21/2013  . Disturbance of skin sensation 04/21/2013  . Cephalalgia 04/21/2013  . Porokeratosis 03/31/2013  . Pain, foot 03/31/2013  . Vitamin D deficiency 04/18/2010  . Fatigue 04/14/2010  . Back pain with right-sided radiculopathy 11/29/2008  . MEDIAL MENISCUS TEAR, LEFT 06/30/2008  . BARRETTS ESOPHAGUS 06/28/2008  . OSTEOARTHRITIS, KNEES, BILATERAL 06/28/2008  . OBESITY 05/24/2008  . HYPERTENSION, BENIGN ESSENTIAL 05/24/2008  . IRRITABLE BOWEL SYNDROME 05/24/2008  . BARIATRIC SURGERY STATUS 03/19/2006  . NEOPLASM, MALIGNANT, COLON, HX OF 03/19/1997    Current Outpatient Prescriptions on File Prior to Visit  Medication Sig Dispense Refill  . aspirin 81 MG tablet Take 81 mg by mouth daily.    . Cyanocobalamin (B-12) 1000 MCG/ML KIT Inject 1,000 mcg as directed every 30  (thirty) days. 1 kit 6  . cyclobenzaprine (FLEXERIL) 10 MG tablet Take 1 tablet (10 mg total) by mouth 3 (three) times daily as needed for muscle spasms (do not take any day that you take tizanidine). 30 tablet 0  . hydrochlorothiazide (HYDRODIURIL) 25 MG tablet Take 0.5 tablets (12.5 mg total) by mouth daily. PATIENT NEEDS OFFICE VISIT FOR ADDITIONAL REFILLS 15 tablet 0  . HYDROcodone-acetaminophen (NORCO/VICODIN) 5-325 MG tablet Take 1 tablet by mouth every 6 (six) hours as needed for moderate pain. 30 tablet 0  . lisinopril (PRINIVIL,ZESTRIL) 20 MG tablet Take 1 tablet (20 mg total) by mouth daily. 90 tablet 3  . tiZANidine (ZANAFLEX) 2 MG tablet Take 1 tablet (2 mg total) by mouth every 8 (eight) hours as needed for muscle spasms. 90 tablet 3  . traZODone (DESYREL) 50 MG tablet Take 1-2 tablets (50-100 mg total) by mouth at bedtime as needed for sleep. 60 tablet 3  . VENTOLIN HFA 108 (90 Base) MCG/ACT inhaler Inhale 1 puff into the lungs every 6 (six) hours as needed for wheezing or shortness of breath. 1 Inhaler 2  . Vitamin D, Ergocalciferol, (DRISDOL) 50000 units CAPS capsule Take 1 capsule (50,000 Units total) by mouth every 7 (seven) days. Sunday 12 capsule 1   Current Facility-Administered Medications on File Prior to Visit  Medication Dose Route Frequency Provider Last Rate Last Dose  . cyanocobalamin ((VITAMIN B-12)) injection 1,000 mcg  1,000 mcg Intramuscular Weekly [name], MD   1,000 mcg at 06/03/15 1435  .  cyanocobalamin ((VITAMIN B-12)) injection 1,000 mcg  1,000 mcg Intramuscular Q30 days [name], MD   1,000 mcg at 07/15/15 1410    Past Medical History:  Diagnosis Date  . Arthritis   . Back pain   . Colon cancer ([place])   . Diarrhea   . GERD (gastroesophageal reflux disease)   . Hypertension   . Insomnia   . MS (multiple sclerosis) ([place])   . Other hammer toe (acquired) 03/31/2013  . Pneumonia    hx of  . Umbilical hernia     Past Surgical History:    Procedure Laterality Date  . ABDOMINAL HYSTERECTOMY    . ANTERIOR CERVICAL DECOMP/DISCECTOMY FUSION N/A 03/01/2014   Procedure: ANTERIOR CERVICAL DECOMPRESSION/DISCECTOMY FUSION 1 LEVEL;  Surgeon: Charlie Pitter, MD;  Location: [facility];  Service: Neurosurgery;  Laterality: N/A;  ANTERIOR CERVICAL DECOMPRESSION/DISCECTOMY FUSION 1 LEVEL  . BUNIONECTOMY Right 10/14/2014   @[facility]   . CESAREAN SECTION    . COLON SURGERY    . COLONOSCOPY    . FOOT SURGERY Right   . GASTRIC BYPASS    . Hammer Toe Repair Right 10/14/2014   RT #2, @[facility]   . TENOTOMY Right 10/14/2014   RT #3, @[facility]     Social History   Social History  . Marital status: Single    Spouse name: N/A  . Number of children: N/A  . Years of education: N/A   Social History Main Topics  . Smoking status: Current Some Day Smoker    Packs/day: 0.25    Years: 20.00    Types: Cigarettes  . Smokeless tobacco: Never Used  . Alcohol use 0.0 oz/week     Comment: 1/2 pint daily  . Drug use: No  . Sexual activity: Yes    Partners: Male   Other Topics Concern  . Not on file   Social History Narrative  . No narrative on file    Family History  Problem Relation Age of Onset  . Stroke Mother   . Hypertension Mother   . Hyperlipidemia Mother   . Diabetes Mother   . Diabetes Brother   . Hypertension Brother   . Hypertension Brother   . Hypertension Brother   . Hypertension Brother   . Hypertension Brother   . Alcoholism Brother     Review of Systems     Objective:  There were no vitals filed for this visit. There were no vitals filed for this visit. There is no height or weight on file to calculate BMI.   Physical Exam        Assessment & Plan:     This encounter was created in error - please disregard.

## 2015-10-24 ENCOUNTER — Ambulatory Visit (INDEPENDENT_AMBULATORY_CARE_PROVIDER_SITE_OTHER): Payer: Medicare Other | Admitting: Podiatry

## 2015-10-24 ENCOUNTER — Encounter: Payer: Self-pay | Admitting: Podiatry

## 2015-10-24 DIAGNOSIS — M79674 Pain in right toe(s): Secondary | ICD-10-CM

## 2015-10-24 DIAGNOSIS — B351 Tinea unguium: Secondary | ICD-10-CM | POA: Diagnosis not present

## 2015-10-24 DIAGNOSIS — Q828 Other specified congenital malformations of skin: Secondary | ICD-10-CM | POA: Diagnosis not present

## 2015-10-24 DIAGNOSIS — M79675 Pain in left toe(s): Secondary | ICD-10-CM | POA: Diagnosis not present

## 2015-10-24 NOTE — Progress Notes (Signed)
Patient ID: [name], female   DOB: 27-Jan-1961, 55 y.o.   MRN: [id number]  Subjective: 55 year old female presents the office today for concerns of painful calluses to both of her feet. She also presents today for nail trim as they are thick and elongated. Denies any redness or drainage or any swelling. Denies any systemic complaints such as fevers, chills, nausea, vomiting. No acute changes since last appointment, and no other complaints at this time.   Objective: AAO x3, NAD DP/PT pulses palpable bilaterally, CRT less than 3 seconds Nails are hypertrophic, dystrophic, discolored, elonagated x 10. Tenderness to nails 1-5 bilaterally. No drainage, swelling, or redness.  Hyperkeratotic lesion present submetatarsal 3 right foot, medial first metatarsal head, dorsal fifth and fourth digits of the PIPJ. Also on the left first metatarsal for and fifth digit. Upon debridement no underlying ulceration, drainage or other signs of infection. No other lesions pre-ulcer lesions. No pain with calf compression, swelling, warmth, erythema  Assessment: Symptomatic hyperkeratotic lesions, symptomatic onychomycosis   Plan: -All treatment options discussed with the patient including all alternatives, risks, complications.  -Hyperkeratotic lesions were debrided 6 without complications or bleeding. -Nails debrided x 10 without complications or bleedings.  -F/U 3 months  -Patient encouraged to call the office with any questions, concerns, change in symptoms.   Celesta Gentile, DPM

## 2015-10-25 ENCOUNTER — Telehealth: Payer: Self-pay | Admitting: Internal Medicine

## 2015-10-25 DIAGNOSIS — M25512 Pain in left shoulder: Secondary | ICD-10-CM | POA: Diagnosis not present

## 2015-10-25 DIAGNOSIS — M542 Cervicalgia: Secondary | ICD-10-CM | POA: Diagnosis not present

## 2015-10-25 DIAGNOSIS — M25511 Pain in right shoulder: Secondary | ICD-10-CM | POA: Diagnosis not present

## 2015-10-25 DIAGNOSIS — M4806 Spinal stenosis, lumbar region: Secondary | ICD-10-CM | POA: Diagnosis not present

## 2015-10-25 DIAGNOSIS — M47816 Spondylosis without myelopathy or radiculopathy, lumbar region: Secondary | ICD-10-CM | POA: Diagnosis not present

## 2015-10-25 NOTE — Telephone Encounter (Signed)
Please follow up with patient in regards to labs.

## 2015-10-26 NOTE — Telephone Encounter (Signed)
Her kidney function has improved.  It is still reduced and we will consider rechecking next week at her appt.

## 2015-10-26 NOTE — Telephone Encounter (Signed)
Spoke with pt, results have not been released yet, please advise.

## 2015-10-27 NOTE — Telephone Encounter (Signed)
Spoke with pt to inform.  

## 2015-11-03 DIAGNOSIS — M545 Low back pain: Secondary | ICD-10-CM | POA: Diagnosis not present

## 2015-11-04 ENCOUNTER — Other Ambulatory Visit (INDEPENDENT_AMBULATORY_CARE_PROVIDER_SITE_OTHER): Payer: Medicare Other

## 2015-11-04 ENCOUNTER — Encounter: Payer: Self-pay | Admitting: Internal Medicine

## 2015-11-04 ENCOUNTER — Ambulatory Visit (INDEPENDENT_AMBULATORY_CARE_PROVIDER_SITE_OTHER): Payer: Medicare Other | Admitting: Internal Medicine

## 2015-11-04 VITALS — BP 112/62 | HR 99 | Temp 98.3°F | Resp 16 | Wt 237.0 lb

## 2015-11-04 DIAGNOSIS — M545 Low back pain: Secondary | ICD-10-CM

## 2015-11-04 DIAGNOSIS — N289 Disorder of kidney and ureter, unspecified: Secondary | ICD-10-CM

## 2015-11-04 DIAGNOSIS — G894 Chronic pain syndrome: Secondary | ICD-10-CM

## 2015-11-04 DIAGNOSIS — M5410 Radiculopathy, site unspecified: Secondary | ICD-10-CM

## 2015-11-04 DIAGNOSIS — E538 Deficiency of other specified B group vitamins: Secondary | ICD-10-CM

## 2015-11-04 LAB — COMPREHENSIVE METABOLIC PANEL
ALT: 20 U/L (ref 0–35)
AST: 29 U/L (ref 0–37)
Albumin: 4 g/dL (ref 3.5–5.2)
Alkaline Phosphatase: 85 U/L (ref 39–117)
BUN: 20 mg/dL (ref 6–23)
CO2: 27 mEq/L (ref 19–32)
Calcium: 9.7 mg/dL (ref 8.4–10.5)
Chloride: 107 mEq/L (ref 96–112)
Creatinine, Ser: 0.99 mg/dL (ref 0.40–1.20)
GFR: 75.01 mL/min (ref >60.00)
Glucose, Bld: 103 mg/dL — ABNORMAL HIGH (ref 70–99)
Potassium: 4.4 mEq/L (ref 3.5–5.1)
Sodium: 140 mEq/L (ref 135–145)
Total Bilirubin: 0.4 mg/dL (ref 0.2–1.2)
Total Protein: 7.4 g/dL (ref 6.0–8.3)

## 2015-11-04 MED ORDER — CYANOCOBALAMIN 1000 MCG/ML IJ SOLN
1000.0000 ug | Freq: Once | INTRAMUSCULAR | Status: AC
Start: 2015-11-04 — End: 2015-11-04
  Administered 2015-11-04: 1000 ug via INTRAMUSCULAR

## 2015-11-04 MED ORDER — OXYCODONE-ACETAMINOPHEN 5-325 MG PO TABS
1.0000 | ORAL_TABLET | Freq: Three times a day (TID) | ORAL | 0 refills | Status: DC | PRN
Start: 2015-11-04 — End: 2015-11-10

## 2015-11-04 NOTE — Progress Notes (Signed)
Pre visit review using our clinic review tool, if applicable. No additional management support is needed unless otherwise documented below in the visit note. 

## 2015-11-04 NOTE — Assessment & Plan Note (Signed)
B12 injection today and monthly 

## 2015-11-04 NOTE — Assessment & Plan Note (Signed)
Multiple pain issues Has been referred to pain management

## 2015-11-04 NOTE — Progress Notes (Signed)
Subjective:    Patient ID: Misty [name], female    DOB: 26-Jun-1960, 55 y.o.   MRN: [id number]  HPI She is here for follow up.  She had an MRI of her lower back yesterday which was ordered by Dr Lynann Bologna. She is eager to find out what is wrong with her back and why she is having so much pain.  She is angry and frustrated about being in pain and not knowing why she has pain.  The first times she saw me was last month for this pain.  She can not stand long periods of time.  She has pain and tightness in her right lower back.  She still has spasms in her arms. She has numbness/tingling in her right leg. The vicodin is not helping her pain.   Dr Lynann Bologna was concerned about myasthenia gravis.  She has seen more than one neurologist for possible Multiple sclerosis and no neurologist disease was found.   Renal insufficiency: she was very concerned about her recently kidney problems and why her kidney function is decreased.   Medications and allergies reviewed with patient and updated if appropriate.  Patient Active Problem List   Diagnosis Date Noted  . Chronic pain syndrome 10/12/2015  . Umbilical hernia 31/49/7026  . Sleep difficulties 07/15/2015  . Bunion, right 06/20/2015  . Callus of foot 04/27/2015  . Muscle spasm 04/16/2015  . B12 deficiency 03/30/2015  . Hyperglycemia 03/30/2015  . Hammer toe of left foot 02/16/2015  . Fibrosis of skin of lower extremity 02/16/2015  . Status post right foot surgery 10/26/2014  . Cervical spondylosis with radiculopathy 03/01/2014  . Hammer toe of right foot 01/26/2014  . Benign intracranial hypertension 06/18/2013  . Pain in lower limb 05/22/2013  . Abnormal gait 04/21/2013  . Arthritis 04/21/2013  . Disturbance of skin sensation 04/21/2013  . Cephalalgia 04/21/2013  . Porokeratosis 03/31/2013  . Pain, foot 03/31/2013  . Vitamin D deficiency 04/18/2010  . Fatigue 04/14/2010  . Back pain with right-sided radiculopathy 11/29/2008  . MEDIAL  MENISCUS TEAR, LEFT 06/30/2008  . BARRETTS ESOPHAGUS 06/28/2008  . OSTEOARTHRITIS, KNEES, BILATERAL 06/28/2008  . OBESITY 05/24/2008  . HYPERTENSION, BENIGN ESSENTIAL 05/24/2008  . IRRITABLE BOWEL SYNDROME 05/24/2008  . BARIATRIC SURGERY STATUS 03/19/2006  . NEOPLASM, MALIGNANT, COLON, HX OF 03/19/1997    Current Outpatient Prescriptions on File Prior to Visit  Medication Sig Dispense Refill  . aspirin 81 MG tablet Take 81 mg by mouth daily.    . Cyanocobalamin (B-12) 1000 MCG/ML KIT Inject 1,000 mcg as directed every 30 (thirty) days. 1 kit 6  . cyclobenzaprine (FLEXERIL) 10 MG tablet Take 1 tablet (10 mg total) by mouth 3 (three) times daily as needed for muscle spasms (do not take any day that you take tizanidine). 30 tablet 0  . hydrochlorothiazide (HYDRODIURIL) 25 MG tablet Take 0.5 tablets (12.5 mg total) by mouth daily. PATIENT NEEDS OFFICE VISIT FOR ADDITIONAL REFILLS 15 tablet 0  . HYDROcodone-acetaminophen (NORCO/VICODIN) 5-325 MG tablet Take 1 tablet by mouth every 6 (six) hours as needed for moderate pain. 30 tablet 0  . lisinopril (PRINIVIL,ZESTRIL) 20 MG tablet Take 1 tablet (20 mg total) by mouth daily. 90 tablet 3  . tiZANidine (ZANAFLEX) 2 MG tablet Take 1 tablet (2 mg total) by mouth every 8 (eight) hours as needed for muscle spasms. 90 tablet 3  . traZODone (DESYREL) 50 MG tablet Take 1-2 tablets (50-100 mg total) by mouth at bedtime as needed for sleep.  60 tablet 3  . VENTOLIN HFA 108 (90 Base) MCG/ACT inhaler Inhale 1 puff into the lungs every 6 (six) hours as needed for wheezing or shortness of breath. 1 Inhaler 2  . Vitamin D, Ergocalciferol, (DRISDOL) 50000 units CAPS capsule Take 1 capsule (50,000 Units total) by mouth every 7 (seven) days. Sunday 12 capsule 1   Current Facility-Administered Medications on File Prior to Visit  Medication Dose Route Frequency Provider Last Rate Last Dose  . cyanocobalamin ((VITAMIN B-12)) injection 1,000 mcg  1,000 mcg Intramuscular  Weekly [name], MD   1,000 mcg at 06/03/15 1435  . cyanocobalamin ((VITAMIN B-12)) injection 1,000 mcg  1,000 mcg Intramuscular Q30 days [name], MD   1,000 mcg at 07/15/15 1410    Past Medical History:  Diagnosis Date  . Arthritis   . Back pain   . Colon cancer ([place])   . Diarrhea   . GERD (gastroesophageal reflux disease)   . Hypertension   . Insomnia   . MS (multiple sclerosis) ([place])   . Other hammer toe (acquired) 03/31/2013  . Pneumonia    hx of  . Umbilical hernia     Past Surgical History:  Procedure Laterality Date  . ABDOMINAL HYSTERECTOMY    . ANTERIOR CERVICAL DECOMP/DISCECTOMY FUSION N/A 03/01/2014   Procedure: ANTERIOR CERVICAL DECOMPRESSION/DISCECTOMY FUSION 1 LEVEL;  Surgeon: Charlie Pitter, MD;  Location: [facility];  Service: Neurosurgery;  Laterality: N/A;  ANTERIOR CERVICAL DECOMPRESSION/DISCECTOMY FUSION 1 LEVEL  . BUNIONECTOMY Right 10/14/2014   '@[facility]'$   . CESAREAN SECTION    . COLON SURGERY    . COLONOSCOPY    . FOOT SURGERY Right   . GASTRIC BYPASS    . Hammer Toe Repair Right 10/14/2014   RT #2, '@[facility]   . TENOTOMY Right 10/14/2014   RT #3, '@[facility]     Social History   Social History  . Marital status: Single    Spouse name: N/A  . Number of children: N/A  . Years of education: N/A   Social History Main Topics  . Smoking status: Current Some Day Smoker    Packs/day: 0.25    Years: 20.00    Types: Cigarettes  . Smokeless tobacco: Never Used  . Alcohol use 0.0 oz/week     Comment: 1/2 pint daily  . Drug use: No  . Sexual activity: Yes    Partners: Male   Other Topics Concern  . None   Social History Narrative  . None    Family History  Problem Relation Age of Onset  . Stroke Mother   . Hypertension Mother   . Hyperlipidemia Mother   . Diabetes Mother   . Diabetes Brother   . Hypertension Brother   . Hypertension Brother   . Hypertension Brother   . Hypertension Brother   . Hypertension Brother   . Alcoholism Brother      Review of Systems  Constitutional: Positive for fever.  Musculoskeletal: Positive for back pain and myalgias (muscle spasms).  Neurological: Positive for tremors, weakness and numbness.       Objective:   Vitals:   11/04/15 1440  BP: 112/62  Pulse: 99  Resp: 16  Temp: 98.3 F (36.8 C)   Filed Weights   11/04/15 1440  Weight: 237 lb (107.5 kg)   Body mass index is 37.68 kg/m.   Physical Exam  Constitutional: She is oriented to person, place, and time. She appears well-developed and well-nourished.  Appears to be in pain, but  not in acute distress  Musculoskeletal: She exhibits edema (trace).  Tenderness right lower back, no cva tenderness  Neurological: She is alert and oriented to person, place, and time.  Psychiatric:  Depressed affect          Assessment & Plan:   ? Renal insufficiency: recheck today  See Problem List for Assessment and Plan of chronic medical problems.

## 2015-11-04 NOTE — Patient Instructions (Signed)
Have blood work today to recheck your kidney function today.  Try the oxycodone for the pain.  Take as directed.  Do not take more than prescribed.

## 2015-11-04 NOTE — Assessment & Plan Note (Signed)
Has see Dr Lynann Bologna Had MRI yesterday vicodin not helping pain Will try oxycodone Will look for MRI results and ortho recommendations

## 2015-11-10 ENCOUNTER — Telehealth: Payer: Self-pay | Admitting: Emergency Medicine

## 2015-11-10 MED ORDER — OXYCODONE-ACETAMINOPHEN 5-325 MG PO TABS: 1.0000 | ORAL_TABLET | Freq: Three times a day (TID) | ORAL | 0 refills | Status: DC | PRN

## 2015-11-10 NOTE — Telephone Encounter (Signed)
Pt called and needs a prescription on her oxyCODONE-acetaminophen (ROXICET) 5-325 MG tablet. She states she is in pain and doesn't have an appointment for surgery set up yet. Consult for surgery is 11/25/15 Please advise thanks.

## 2015-11-10 NOTE — Telephone Encounter (Signed)
Please advise 

## 2015-11-10 NOTE — Telephone Encounter (Signed)
Ok to refill - print out - I can sign tomorrow

## 2015-11-11 ENCOUNTER — Encounter: Payer: Self-pay | Admitting: Internal Medicine

## 2015-11-11 DIAGNOSIS — Z79891 Long term (current) use of opiate analgesic: Secondary | ICD-10-CM | POA: Diagnosis not present

## 2015-11-11 NOTE — Telephone Encounter (Signed)
LVM informing pt rx was ready for pick-up 

## 2015-11-16 ENCOUNTER — Other Ambulatory Visit: Payer: Self-pay | Admitting: Internal Medicine

## 2015-11-16 DIAGNOSIS — R825 Elevated urine levels of drugs, medicaments and biological substances: Secondary | ICD-10-CM | POA: Insufficient documentation

## 2015-11-17 ENCOUNTER — Telehealth: Payer: Self-pay | Admitting: Emergency Medicine

## 2015-11-17 NOTE — Telephone Encounter (Signed)
Received call from pt requesting refill on Oxycodone. Informed pt that Dr Quay Burow would no longer be prescribing narcotics or any type of pain meds to her anymore due to her urine testing positive for Cocaine. Pt states she does not understand how cocaine showed up on her UDS and that she does not do cocaine. Pt would like her urine retested or have a blood test drawn. I have spoke with Almyra Free and she states that the original specimen can be re-tested but that is it. It will also cost a large amount, which the pt has to pay for. I will ask Amy (that handles UDSs) tomorrow the exact price and contact pt to let her know.   Pt states she is in need of a refill, informed her that at this time we will not be refilling her medication.

## 2015-11-17 NOTE — Telephone Encounter (Signed)
Noted, agreed.  Thank you Lovena Le.

## 2015-11-22 NOTE — Telephone Encounter (Signed)
Spoke with Delsa Sale from [facility]. The results from the [facility] will be looked at for review. Delsa Sale will contact me with the review results.

## 2015-11-22 NOTE — Telephone Encounter (Signed)
Spoke with Delsa Sale again, she states that there is no indication that the specimen was inaccurate and should be tested again.

## 2015-11-23 DIAGNOSIS — M47816 Spondylosis without myelopathy or radiculopathy, lumbar region: Secondary | ICD-10-CM | POA: Diagnosis not present

## 2015-11-23 DIAGNOSIS — Z859 Personal history of malignant neoplasm, unspecified: Secondary | ICD-10-CM | POA: Diagnosis not present

## 2015-11-23 DIAGNOSIS — M549 Dorsalgia, unspecified: Secondary | ICD-10-CM | POA: Diagnosis not present

## 2015-11-23 DIAGNOSIS — M545 Low back pain: Secondary | ICD-10-CM | POA: Diagnosis not present

## 2015-11-23 DIAGNOSIS — M791 Myalgia: Secondary | ICD-10-CM | POA: Diagnosis not present

## 2015-11-24 ENCOUNTER — Telehealth: Payer: Self-pay | Admitting: Internal Medicine

## 2015-11-24 ENCOUNTER — Telehealth: Payer: Self-pay | Admitting: Emergency Medicine

## 2015-11-24 DIAGNOSIS — M4806 Spinal stenosis, lumbar region: Secondary | ICD-10-CM | POA: Diagnosis not present

## 2015-11-24 NOTE — Telephone Encounter (Signed)
Pt request to speak to the assistant concern about referral for another doctor for renal problem. Please call her back

## 2015-11-24 NOTE — Telephone Encounter (Signed)
RX faxed for Rolling walker to [facility]

## 2015-11-24 NOTE — Telephone Encounter (Signed)
Spoke with Pt, states that the orthopedic dr would like her to be sent to a Renal specialist and a neurologist. Please advise.

## 2015-11-24 NOTE — Telephone Encounter (Signed)
I can refer her but need to know the reason.  Her kidney function is now normal, but I can still refer her if she wants.   She has seen two different neurologists in the past year, but if she wants to see a new one I can refer her, but need to know the reason and where she wants to go.

## 2015-11-28 ENCOUNTER — Other Ambulatory Visit (HOSPITAL_COMMUNITY): Payer: Self-pay | Admitting: Orthopaedic Surgery

## 2015-11-28 DIAGNOSIS — M545 Low back pain: Secondary | ICD-10-CM

## 2015-11-28 DIAGNOSIS — M47816 Spondylosis without myelopathy or radiculopathy, lumbar region: Secondary | ICD-10-CM

## 2015-11-28 DIAGNOSIS — M791 Myalgia, unspecified site: Secondary | ICD-10-CM

## 2015-11-28 DIAGNOSIS — Z859 Personal history of malignant neoplasm, unspecified: Secondary | ICD-10-CM

## 2015-11-29 ENCOUNTER — Encounter: Payer: Self-pay | Admitting: Internal Medicine

## 2015-11-29 DIAGNOSIS — M48061 Spinal stenosis, lumbar region without neurogenic claudication: Secondary | ICD-10-CM | POA: Insufficient documentation

## 2015-11-30 NOTE — Telephone Encounter (Signed)
Spoke with pt, she is going to drop a copy of the blood work off that was done by ortho. Pt states that Ortho is telling her she needs to have her nervous system evaluated. Please advise if you think she needs to see another neurologist.

## 2015-11-30 NOTE — Telephone Encounter (Signed)
I do not think she needs to see another neurologist, but can refer her is she wants to see one.

## 2015-12-04 ENCOUNTER — Telehealth: Payer: Self-pay | Admitting: Internal Medicine

## 2015-12-04 NOTE — Telephone Encounter (Signed)
I reviewed her kidney function from Dr Lynann Bologna.  She has had blood work after this and her kidney function was normal.  It may have been reduced temporarily due to nsaid use (advil, aleve) or dehydration.  But it has returned to normal. I do not think she needs to a kidney doctor

## 2015-12-06 ENCOUNTER — Encounter (HOSPITAL_COMMUNITY): Payer: Medicare Other

## 2015-12-06 NOTE — Telephone Encounter (Signed)
Spoke with pt to inform. Pt was okay with this.

## 2015-12-07 ENCOUNTER — Other Ambulatory Visit (HOSPITAL_COMMUNITY): Payer: Self-pay | Admitting: Specialist

## 2015-12-07 ENCOUNTER — Encounter (HOSPITAL_COMMUNITY)
Admission: RE | Admit: 2015-12-07 | Discharge: 2015-12-07 | Disposition: A | Discharge status: Home / Self Care | Payer: Medicare Other | Source: Ambulatory Visit | Attending: Orthopaedic Surgery | Admitting: Orthopaedic Surgery

## 2015-12-07 ENCOUNTER — Inpatient Hospital Stay
Admission: RE | Admit: 2015-12-07 | Discharge: 2015-12-07 | Disposition: A | Discharge status: Home / Self Care | Payer: Self-pay | Source: Ambulatory Visit | Attending: Orthopedic Surgery | Admitting: Orthopedic Surgery

## 2015-12-07 DIAGNOSIS — M545 Low back pain: Secondary | ICD-10-CM | POA: Diagnosis not present

## 2015-12-07 DIAGNOSIS — R52 Pain, unspecified: Secondary | ICD-10-CM

## 2015-12-07 DIAGNOSIS — M791 Myalgia, unspecified site: Secondary | ICD-10-CM

## 2015-12-07 DIAGNOSIS — M47816 Spondylosis without myelopathy or radiculopathy, lumbar region: Secondary | ICD-10-CM | POA: Diagnosis not present

## 2015-12-07 DIAGNOSIS — M549 Dorsalgia, unspecified: Secondary | ICD-10-CM | POA: Diagnosis not present

## 2015-12-07 DIAGNOSIS — Z859 Personal history of malignant neoplasm, unspecified: Secondary | ICD-10-CM | POA: Diagnosis not present

## 2015-12-07 MED ORDER — TECHNETIUM TC 99M MEDRONATE IV KIT
25.2000 | PACK | Freq: Once | INTRAVENOUS | Status: AC | PRN
  Administered 2015-12-07: 25.2 via INTRAVENOUS

## 2015-12-12 DIAGNOSIS — E669 Obesity, unspecified: Secondary | ICD-10-CM | POA: Diagnosis not present

## 2015-12-12 DIAGNOSIS — Z79891 Long term (current) use of opiate analgesic: Secondary | ICD-10-CM | POA: Diagnosis not present

## 2015-12-12 DIAGNOSIS — M5137 Other intervertebral disc degeneration, lumbosacral region: Secondary | ICD-10-CM | POA: Diagnosis not present

## 2015-12-12 DIAGNOSIS — Z79899 Other long term (current) drug therapy: Secondary | ICD-10-CM | POA: Diagnosis not present

## 2015-12-12 DIAGNOSIS — M47817 Spondylosis without myelopathy or radiculopathy, lumbosacral region: Secondary | ICD-10-CM | POA: Diagnosis not present

## 2015-12-12 DIAGNOSIS — G894 Chronic pain syndrome: Secondary | ICD-10-CM | POA: Diagnosis not present

## 2015-12-14 DIAGNOSIS — M791 Myalgia: Secondary | ICD-10-CM | POA: Diagnosis not present

## 2015-12-14 DIAGNOSIS — M5136 Other intervertebral disc degeneration, lumbar region: Secondary | ICD-10-CM | POA: Diagnosis not present

## 2015-12-14 DIAGNOSIS — M47816 Spondylosis without myelopathy or radiculopathy, lumbar region: Secondary | ICD-10-CM | POA: Diagnosis not present

## 2015-12-20 ENCOUNTER — Other Ambulatory Visit: Payer: Self-pay | Admitting: Rehabilitation

## 2015-12-20 DIAGNOSIS — M47816 Spondylosis without myelopathy or radiculopathy, lumbar region: Secondary | ICD-10-CM

## 2015-12-29 DIAGNOSIS — S42025A Nondisplaced fracture of shaft of left clavicle, initial encounter for closed fracture: Secondary | ICD-10-CM | POA: Diagnosis not present

## 2015-12-29 DIAGNOSIS — M25512 Pain in left shoulder: Secondary | ICD-10-CM | POA: Diagnosis not present

## 2015-12-30 ENCOUNTER — Ambulatory Visit
Admission: RE | Admit: 2015-12-30 | Discharge: 2015-12-30 | Disposition: A | Discharge status: Home / Self Care | Payer: Medicare Other | Source: Ambulatory Visit | Attending: Rehabilitation | Admitting: Rehabilitation

## 2015-12-30 DIAGNOSIS — R109 Unspecified abdominal pain: Secondary | ICD-10-CM | POA: Diagnosis not present

## 2015-12-30 DIAGNOSIS — M47816 Spondylosis without myelopathy or radiculopathy, lumbar region: Secondary | ICD-10-CM

## 2015-12-30 MED ORDER — GADOBENATE DIMEGLUMINE 529 MG/ML IV SOLN
20.0000 mL | Freq: Once | INTRAVENOUS | Status: AC | PRN
  Administered 2015-12-30: 20 mL via INTRAVENOUS

## 2016-01-02 DIAGNOSIS — S42025A Nondisplaced fracture of shaft of left clavicle, initial encounter for closed fracture: Secondary | ICD-10-CM | POA: Diagnosis not present

## 2016-01-02 DIAGNOSIS — M545 Low back pain: Secondary | ICD-10-CM | POA: Diagnosis not present

## 2016-01-04 DIAGNOSIS — M5136 Other intervertebral disc degeneration, lumbar region: Secondary | ICD-10-CM | POA: Diagnosis not present

## 2016-01-16 DIAGNOSIS — S42025D Nondisplaced fracture of shaft of left clavicle, subsequent encounter for fracture with routine healing: Secondary | ICD-10-CM | POA: Diagnosis not present

## 2016-01-18 ENCOUNTER — Ambulatory Visit (INDEPENDENT_AMBULATORY_CARE_PROVIDER_SITE_OTHER): Payer: Medicare Other | Admitting: Internal Medicine

## 2016-01-18 ENCOUNTER — Other Ambulatory Visit (INDEPENDENT_AMBULATORY_CARE_PROVIDER_SITE_OTHER): Payer: Medicare Other

## 2016-01-18 ENCOUNTER — Encounter: Payer: Self-pay | Admitting: Internal Medicine

## 2016-01-18 VITALS — BP 158/82 | HR 94 | Temp 97.8°F | Resp 16 | Ht 67.0 in | Wt 246.0 lb

## 2016-01-18 DIAGNOSIS — Z1159 Encounter for screening for other viral diseases: Secondary | ICD-10-CM | POA: Diagnosis not present

## 2016-01-18 DIAGNOSIS — G479 Sleep disorder, unspecified: Secondary | ICD-10-CM

## 2016-01-18 DIAGNOSIS — R739 Hyperglycemia, unspecified: Secondary | ICD-10-CM | POA: Diagnosis not present

## 2016-01-18 DIAGNOSIS — Z23 Encounter for immunization: Secondary | ICD-10-CM | POA: Diagnosis not present

## 2016-01-18 DIAGNOSIS — I10 Essential (primary) hypertension: Secondary | ICD-10-CM

## 2016-01-18 DIAGNOSIS — E559 Vitamin D deficiency, unspecified: Secondary | ICD-10-CM

## 2016-01-18 DIAGNOSIS — E538 Deficiency of other specified B group vitamins: Secondary | ICD-10-CM | POA: Diagnosis not present

## 2016-01-18 LAB — COMPREHENSIVE METABOLIC PANEL
ALT: 15 U/L (ref 0–35)
AST: 19 U/L (ref 0–37)
Albumin: 4 g/dL (ref 3.5–5.2)
Alkaline Phosphatase: 119 U/L — ABNORMAL HIGH (ref 39–117)
BUN: 12 mg/dL (ref 6–23)
CO2: 29 mEq/L (ref 19–32)
Calcium: 9.9 mg/dL (ref 8.4–10.5)
Chloride: 105 mEq/L (ref 96–112)
Creatinine, Ser: 0.64 mg/dL (ref 0.40–1.20)
GFR: 124.01 mL/min (ref >60.00)
Glucose, Bld: 90 mg/dL (ref 70–99)
Potassium: 4.2 mEq/L (ref 3.5–5.1)
Sodium: 142 mEq/L (ref 135–145)
Total Bilirubin: 0.5 mg/dL (ref 0.2–1.2)
Total Protein: 7.4 g/dL (ref 6.0–8.3)

## 2016-01-18 LAB — HEMOGLOBIN A1C: Hgb A1c MFr Bld: 5.1 % (ref 4.6–6.5)

## 2016-01-18 LAB — VITAMIN D 25 HYDROXY (VIT D DEFICIENCY, FRACTURES): VITD: 23.67 ng/mL — ABNORMAL LOW (ref 30.00–100.00)

## 2016-01-18 MED ORDER — CHOLECALCIFEROL 125 MCG (5000 UT) PO TABS: 5000.0000 [IU] | ORAL_TABLET | Freq: Every day | ORAL | Status: DC

## 2016-01-18 MED ORDER — HYDROCHLOROTHIAZIDE 25 MG PO TABS
12.5000 mg | ORAL_TABLET | Freq: Every day | ORAL | 1 refills | Status: DC
Start: 2016-01-18 — End: 2016-01-18

## 2016-01-18 MED ORDER — HYDROCHLOROTHIAZIDE 25 MG PO TABS
12.5000 mg | ORAL_TABLET | Freq: Every day | ORAL | 1 refills | Status: DC
Start: 2016-01-18 — End: 2016-03-13

## 2016-01-18 MED ORDER — CYANOCOBALAMIN 1000 MCG/ML IJ SOLN
1000.0000 ug | Freq: Once | INTRAMUSCULAR | Status: AC
  Administered 2016-01-18: 1000 ug via INTRAMUSCULAR

## 2016-01-18 NOTE — Assessment & Plan Note (Signed)
BP elevated here today Continue lisinopril 20 mg daily Start hctz 12.5 mg daily cmp today Stressed low sodium and weight loss

## 2016-01-18 NOTE — Assessment & Plan Note (Signed)
Continue monthly injections.  ?

## 2016-01-18 NOTE — Patient Instructions (Addendum)
Start taking vitamin d 5000 units daily. Make an appointment with Gyn.  [facility] [address] [place], [place] [address] from [place]  [place], [place]   Test(s) ordered today. Your results will be released to [facility] (or called to you) after review, usually within 72hours after test completion. If any changes need to be made, you will be notified at that same time.  All other Health Maintenance issues reviewed.   All recommended immunizations and age-appropriate screenings are up-to-date or discussed.   a flu vaccine a B12 injection were administered today.   Medications reviewed and updated.  Changes include restarting hydrochlorothiazide 12.5 mg daily  Your prescription(s) have been submitted to your pharmacy. Please take as directed and contact our office if you believe you are having problem(s) with the medication(s).   Please followup in 3 months

## 2016-01-18 NOTE — Progress Notes (Signed)
Subjective:    Patient ID: Misty [name], female    DOB: July 08, 1960, 55 y.o.   MRN: [id number]  HPI The patient is here for follow up.  Hypertension: She is taking her medication daily. She is compliant with a low sodium diet.  She has been having leg edema bilaterally since her recent fall.  She denies chest pain, palpitations, shortness of breath and regular headaches. She is not exercising regularly.  She does monitor her blood pressure at home and it has been elevated - about the same as today.    B12 deficiency:  She has had a gastric bypass in the past.  She has been getting monthly injections.  She wonders if she needs to continue with the injections.   Vitamin d def:  She took her full prescription.  She has not started taking a daily vitamin d.   Sleep difficulties:  She took trazodone but it caused a rash and she stopped it.  She is still not sleeping well.    Spinal stenosis, lumbar and chronic pain syndrome: She is following with pain management and is taking her Percocet and muscle relaxer as prescribed. She is also following with orthopedics and will have another injection week in her back.  Leg edema:  She has had leg edema recently.  She has been elevating her legs.  She denies increased sodium intake.  She was on hctz in the past but this was discontinued when she had decreased kidney function - she did tolerate it for a long time previously.    Medications and allergies reviewed with patient and updated if appropriate.  Patient Active Problem List   Diagnosis Date Noted  . Spinal stenosis of lumbar region 11/29/2015  . Positive urine drug screen 11/16/2015  . Chronic pain syndrome 10/12/2015  . Umbilical hernia 81/19/1478  . Sleep difficulties 07/15/2015  . Bunion, right 06/20/2015  . Callus of foot 04/27/2015  . Muscle spasm 04/16/2015  . B12 deficiency 03/30/2015  . Hyperglycemia 03/30/2015  . Hammer toe of left foot 02/16/2015  . Fibrosis of skin of  lower extremity 02/16/2015  . Status post right foot surgery 10/26/2014  . Cervical spondylosis with radiculopathy 03/01/2014  . Hammer toe of right foot 01/26/2014  . Benign intracranial hypertension 06/18/2013  . Pain in lower limb 05/22/2013  . Abnormal gait 04/21/2013  . Arthritis 04/21/2013  . Disturbance of skin sensation 04/21/2013  . Cephalalgia 04/21/2013  . Porokeratosis 03/31/2013  . Pain, foot 03/31/2013  . Vitamin D deficiency 04/18/2010  . Fatigue 04/14/2010  . Back pain with right-sided radiculopathy 11/29/2008  . MEDIAL MENISCUS TEAR, LEFT 06/30/2008  . BARRETTS ESOPHAGUS 06/28/2008  . OSTEOARTHRITIS, KNEES, BILATERAL 06/28/2008  . OBESITY 05/24/2008  . HYPERTENSION, BENIGN ESSENTIAL 05/24/2008  . IRRITABLE BOWEL SYNDROME 05/24/2008  . BARIATRIC SURGERY STATUS 03/19/2006  . NEOPLASM, MALIGNANT, COLON, HX OF 03/19/1997    Current Outpatient Prescriptions on File Prior to Visit  Medication Sig Dispense Refill  . aspirin 81 MG tablet Take 81 mg by mouth daily.    . Cyanocobalamin (B-12) 1000 MCG/ML KIT Inject 1,000 mcg as directed every 30 (thirty) days. 1 kit 6  . lisinopril (PRINIVIL,ZESTRIL) 20 MG tablet Take 1 tablet (20 mg total) by mouth daily. 90 tablet 3  . VENTOLIN HFA 108 (90 Base) MCG/ACT inhaler Inhale 1 puff into the lungs every 6 (six) hours as needed for wheezing or shortness of breath. 1 Inhaler 2   Current Facility-Administered Medications on File  Prior to Visit  Medication Dose Route Frequency Provider Last Rate Last Dose  . cyanocobalamin ((VITAMIN B-12)) injection 1,000 mcg  1,000 mcg Intramuscular Weekly [name], MD   1,000 mcg at 06/03/15 1435    Past Medical History:  Diagnosis Date  . Arthritis   . Back pain   . Colon cancer ([place])   . Diarrhea   . GERD (gastroesophageal reflux disease)   . Hypertension   . Insomnia   . MS (multiple sclerosis) ([place])   . Other hammer toe (acquired) 03/31/2013  . Pneumonia    hx of  . Umbilical  hernia     Past Surgical History:  Procedure Laterality Date  . ABDOMINAL HYSTERECTOMY    . ANTERIOR CERVICAL DECOMP/DISCECTOMY FUSION N/A 03/01/2014   Procedure: ANTERIOR CERVICAL DECOMPRESSION/DISCECTOMY FUSION 1 LEVEL;  Surgeon: Charlie Pitter, MD;  Location: [facility];  Service: Neurosurgery;  Laterality: N/A;  ANTERIOR CERVICAL DECOMPRESSION/DISCECTOMY FUSION 1 LEVEL  . BUNIONECTOMY Right 10/14/2014   '@[facility]'$   . CESAREAN SECTION    . COLON SURGERY    . COLONOSCOPY    . FOOT SURGERY Right   . GASTRIC BYPASS    . Hammer Toe Repair Right 10/14/2014   RT #2, '@[facility]'$   . TENOTOMY Right 10/14/2014   RT #3, '@[facility]'$     Social History   Social History  . Marital status: Single    Spouse name: N/A  . Number of children: N/A  . Years of education: N/A   Social History Main Topics  . Smoking status: Current Some Day Smoker    Packs/day: 0.25    Years: 20.00    Types: Cigarettes  . Smokeless tobacco: Never Used  . Alcohol use 0.0 oz/week     Comment: 1/2 pint daily  . Drug use: No  . Sexual activity: Yes    Partners: Male   Other Topics Concern  . None   Social History Narrative  . None    Family History  Problem Relation Age of Onset  . Stroke Mother   . Hypertension Mother   . Hyperlipidemia Mother   . Diabetes Mother   . Diabetes Brother   . Hypertension Brother   . Hypertension Brother   . Hypertension Brother   . Hypertension Brother   . Hypertension Brother   . Alcoholism Brother     Review of Systems  Constitutional: Negative for chills and fever.  Respiratory: Negative for cough, shortness of breath and wheezing.   Cardiovascular: Positive for leg swelling. Negative for chest pain and palpitations.  Musculoskeletal: Positive for back pain and myalgias (muscle spasms).  Neurological: Positive for light-headedness (chronic, no change). Negative for headaches.       Objective:   Vitals:   01/18/16 0934  BP: (!) 158/82  Pulse: 94  Resp: 16  Temp: 97.8 F  (36.6 C)   Filed Weights   01/18/16 0934  Weight: 246 lb (111.6 kg)   Body mass index is 38.53 kg/m.   Physical Exam    Constitutional: Appears well-developed and well-nourished. No distress.  HENT:  Head: Normocephalic and atraumatic.  Neck: Neck supple. No tracheal deviation present. No thyromegaly present.  No cervical lymphadenopathy Cardiovascular: Normal rate, regular rhythm and normal heart sounds.   2/6 systolic murmur heard. No carotid bruit .  1+ pitting edema - b/l LE Pulmonary/Chest: Effort normal and breath sounds normal. No respiratory distress. No has no wheezes. No rales.  Skin: Skin is warm and dry. Not diaphoretic.  Psychiatric: Normal mood and affect. Behavior is normal.     Assessment & Plan:    See Problem List for Assessment and Plan of chronic medical problems.

## 2016-01-18 NOTE — Assessment & Plan Note (Signed)
Check vitamin D Start 5000 units of otc vitamin D daily

## 2016-01-18 NOTE — Progress Notes (Signed)
Pre visit review using our clinic review tool, if applicable. No additional management support is needed unless otherwise documented below in the visit note. 

## 2016-01-18 NOTE — Assessment & Plan Note (Signed)
Did not tolerate trazodone  Will try to avoid further medication - if she needs something can consider something such as gabapentin or amitriptyline

## 2016-01-18 NOTE — Assessment & Plan Note (Signed)
Check a1c 

## 2016-01-19 LAB — HEPATITIS C ANTIBODY: HCV Ab: NEGATIVE

## 2016-01-25 DIAGNOSIS — M47816 Spondylosis without myelopathy or radiculopathy, lumbar region: Secondary | ICD-10-CM | POA: Diagnosis not present

## 2016-01-27 ENCOUNTER — Ambulatory Visit (INDEPENDENT_AMBULATORY_CARE_PROVIDER_SITE_OTHER): Payer: Medicare Other | Admitting: Podiatry

## 2016-01-27 ENCOUNTER — Encounter: Payer: Self-pay | Admitting: Podiatry

## 2016-01-27 DIAGNOSIS — M79675 Pain in left toe(s): Secondary | ICD-10-CM

## 2016-01-27 DIAGNOSIS — B351 Tinea unguium: Secondary | ICD-10-CM | POA: Diagnosis not present

## 2016-01-27 DIAGNOSIS — Q828 Other specified congenital malformations of skin: Secondary | ICD-10-CM | POA: Diagnosis not present

## 2016-01-27 DIAGNOSIS — M79674 Pain in right toe(s): Secondary | ICD-10-CM

## 2016-01-30 DIAGNOSIS — S42025D Nondisplaced fracture of shaft of left clavicle, subsequent encounter for fracture with routine healing: Secondary | ICD-10-CM | POA: Diagnosis not present

## 2016-01-30 NOTE — Progress Notes (Signed)
Patient ID: Misty [name], female   DOB: 1961-01-26, 55 y.o.   MRN: [id number]  Subjective: 55 year old female presents the office today for concerns of painful calluses to both of her feet. She also presents today for nail trim as they are thick and elongated. Denies any redness or drainage or any swelling. Denies any systemic complaints such as fevers, chills, nausea, vomiting. No acute changes since last appointment, and no other complaints at this time.   Objective: AAO x3, NAD DP/PT pulses palpable bilaterally, CRT less than 3 seconds Nails are hypertrophic, dystrophic, discolored, elonagated x 10. Tenderness to nails 1-5 bilaterally. No drainage, swelling, or redness.  Hyperkeratotic lesion present submetatarsal 3 right foot, medial first metatarsal head, dorsal fifth and fourth digits of the PIPJ. Also on the left first metatarsal for and fifth digit. Upon debridement no underlying ulceration, drainage or other signs of infection. No other lesions pre-ulcer lesions. No pain with calf compression, swelling, warmth, erythema   Assessment: Symptomatic hyperkeratotic lesions, symptomatic onychomycosis   Plan: -All treatment options discussed with the patient including all alternatives, risks, complications.  -Hyperkeratotic lesions were debrided 6 without complications or bleeding. -Nails debrided x 10 without complications or bleedings.  -F/U 3 months  -Patient encouraged to call the office with any questions, concerns, change in symptoms.   Celesta Gentile, DPM

## 2016-02-01 ENCOUNTER — Other Ambulatory Visit: Payer: Self-pay | Admitting: Emergency Medicine

## 2016-02-01 DIAGNOSIS — M47816 Spondylosis without myelopathy or radiculopathy, lumbar region: Secondary | ICD-10-CM | POA: Diagnosis not present

## 2016-02-01 MED ORDER — VITAMIN D 50 MCG (2000 UT) PO CAPS: 2000.0000 [IU] | ORAL_CAPSULE | Freq: Every day | ORAL | 1 refills | Status: DC

## 2016-02-01 NOTE — Telephone Encounter (Signed)
RX for Vitamin D 2000 units sent to [facility], pt would like to know if she should be taking Vitamin B12 PO or injections still. Please advise.

## 2016-02-01 NOTE — Telephone Encounter (Signed)
It is up to to her.   If she wants to try the oral she can start taking B12 1000 mcg daily and we can monitor her B12 level to make sure it remains good. If it does not she may need to go back to the injections

## 2016-02-20 DIAGNOSIS — M7502 Adhesive capsulitis of left shoulder: Secondary | ICD-10-CM | POA: Diagnosis not present

## 2016-02-21 DIAGNOSIS — M48062 Spinal stenosis, lumbar region with neurogenic claudication: Secondary | ICD-10-CM | POA: Diagnosis not present

## 2016-02-29 ENCOUNTER — Ambulatory Visit: Payer: Medicare Other | Admitting: Physical Therapy

## 2016-03-01 ENCOUNTER — Encounter: Payer: Self-pay | Admitting: Physical Therapy

## 2016-03-01 ENCOUNTER — Ambulatory Visit: Payer: Medicare Other | Attending: Internal Medicine | Admitting: Physical Therapy

## 2016-03-01 DIAGNOSIS — M6281 Muscle weakness (generalized): Secondary | ICD-10-CM | POA: Diagnosis not present

## 2016-03-01 DIAGNOSIS — G8929 Other chronic pain: Secondary | ICD-10-CM | POA: Diagnosis not present

## 2016-03-01 DIAGNOSIS — M25612 Stiffness of left shoulder, not elsewhere classified: Secondary | ICD-10-CM

## 2016-03-01 DIAGNOSIS — R293 Abnormal posture: Secondary | ICD-10-CM

## 2016-03-01 DIAGNOSIS — M25512 Pain in left shoulder: Secondary | ICD-10-CM | POA: Insufficient documentation

## 2016-03-01 NOTE — Therapy (Signed)
[place], [place], [address] Phone: [phone]   Fax:  [phone]  Physical Therapy Evaluation  Patient Details  Name: Misty [name] MRN: [id number] Date of Birth: 1960-09-05 Referring Provider: Almedia Balls MD  Encounter Date: 03/01/2016      PT End of Session - 03/01/16 1711    Visit Number 1   Number of Visits 13   Date for PT Re-Evaluation 04/26/16   Authorization Type Medicare: kx mod by 15th visit; progress note by 10th visit.    PT Start Time 1630   PT Stop Time 1716   PT Time Calculation (min) 46 min   Activity Tolerance Patient tolerated treatment well   Behavior During Therapy WFL for tasks assessed/performed      Past Medical History:  Diagnosis Date  . Allergy   . Arthritis   . Back pain   . Colon cancer ([place])   . Depression   . Diarrhea   . GERD (gastroesophageal reflux disease)   . Hypertension   . Insomnia   . MS (multiple sclerosis) ([place])   . Other hammer toe (acquired) 03/31/2013  . Pneumonia    hx of  . Umbilical hernia     Past Surgical History:  Procedure Laterality Date  . ABDOMINAL HYSTERECTOMY    . ANTERIOR CERVICAL DECOMP/DISCECTOMY FUSION N/A 03/01/2014   Procedure: ANTERIOR CERVICAL DECOMPRESSION/DISCECTOMY FUSION 1 LEVEL;  Surgeon: Charlie Pitter, MD;  Location: [facility];  Service: Neurosurgery;  Laterality: N/A;  ANTERIOR CERVICAL DECOMPRESSION/DISCECTOMY FUSION 1 LEVEL  . BUNIONECTOMY Right 10/14/2014   @[facility]   . CESAREAN SECTION    . COLON SURGERY    . COLONOSCOPY    . FOOT SURGERY Right   . GASTRIC BYPASS    . Hammer Toe Repair Right 10/14/2014   RT #2, @[facility]   . TENOTOMY Right 10/14/2014   RT #3, @[facility]     There were no vitals filed for this visit.       Subjective Assessment - 03/01/16 1642    Subjective pt is a 55 y.o F with CC of intermitten L shoulder pain s/p L clavicle which occured from falling backward on steps on 12/12/2015. just got  out of the brace last week, have avoided the motion of the arm / shoulder. pain in the clavicle with referral to the top of the arm.    Limitations Lifting;House hold activities   How long can you sit comfortably? 2 hours   How long can you stand comfortably? 8 hours   How long can you walk comfortably? unlimited   Diagnostic tests x-ray 2 weeks ago   Patient Stated Goals be able to move the arm again, reduce  pain, be able to lift and carry items without difficulty   Currently in Pain? Yes   Pain Score 4    Pain Location Shoulder   Pain Orientation Anterior;Proximal;Left   Pain Descriptors / Indicators Tightness;Sore;Aching;Spasm   Pain Type Chronic pain   Pain Onset More than a month ago   Pain Frequency Constant   Aggravating Factors  moving the arm around, lifting/ carrying, using the hands,    Pain Relieving Factors heat, using the brace and sling,             [facility] PT Assessment - 03/01/16 0001      Assessment   Medical Diagnosis s/p L calvicle fx   Referring Provider Almedia Balls MD   Onset Date/Surgical Date --  12/12/2015  Hand Dominance Right   Next MD Visit --  3 weeks   Prior Therapy yes     Precautions   Precautions None     Restrictions   Weight Bearing Restrictions No     Balance Screen   Has the patient fallen in the past 6 months Yes   How many times? 3   Has the patient had a decrease in activity level because of a fear of falling?  Yes   Is the patient reluctant to leave their home because of a fear of falling?  No     Home Environment   Living Environment Private residence   Living Arrangements Spouse/significant other;Alone   Available Help at Discharge Available PRN/intermittently   Type of Home Apartment   Home Access Stairs to enter   Entrance Stairs-Number of Steps 4   Entrance Stairs-Rails None   Home Layout Two level   Alternate Level Stairs-Number of Steps 17   Alternate Level Stairs-Rails Right     Prior Function   Level of  Independence Independent;Independent with basic ADLs   Vocation On disability   Leisure cooking     Cognition   Overall Cognitive Status Within Functional Limits for tasks assessed     Observation/Other Assessments   Focus on Therapeutic Outcomes (FOTO)  not capture at evaluation     Posture/Postural Control   Posture/Postural Control Postural limitations   Postural Limitations Rounded Shoulders;Forward head     ROM / Strength   AROM / PROM / Strength AROM;PROM;Strength     AROM   AROM Assessment Site Shoulder   Right/Left Shoulder Right;Left   Right Shoulder Extension 56 Degrees   Right Shoulder Flexion 148 Degrees   Right Shoulder ABduction 127 Degrees   Right Shoulder Internal Rotation 70 Degrees  assessed at 90/90   Right Shoulder External Rotation 74 Degrees  assessed at 90/90   Left Shoulder Extension 56 Degrees  ERP   Left Shoulder Flexion 115 Degrees  ERP   Left Shoulder ABduction 89 Degrees  increased pain at End range   Left Shoulder Internal Rotation 60 Degrees  end range pulling assessed in abduction of 45 degres   Left Shoulder External Rotation 40 Degrees  end range pulling assessed in abduction of 45 degres     PROM   PROM Assessment Site Shoulder   Right/Left Shoulder Left   Left Shoulder Extension 60 Degrees   Left Shoulder Flexion 110 Degrees   Left Shoulder ABduction 115 Degrees   Left Shoulder Internal Rotation 64 Degrees   Left Shoulder External Rotation 45 Degrees     Strength   Strength Assessment Site Shoulder;Hand   Right/Left Shoulder Right;Left   Right Shoulder Flexion 4/5   Right Shoulder Extension 4+/5   Right Shoulder ABduction 4-/5   Right Shoulder Internal Rotation 4+/5   Right Shoulder External Rotation 4+/5   Left Shoulder Flexion 3/5  pain during testing    Left Shoulder Extension 3+/5  pain during testing    Left Shoulder ABduction 3/5  pain during testing    Left Shoulder Internal Rotation 3+/5  pain during testing     Left Shoulder External Rotation 3+/5  pain during testing    Right Hand Grip (lbs) 66.6  64, 68,68   Left Hand Grip (lbs) 47.3  54,43,45     Palpation   Palpation comment significant tenderness acromioclavicular joint, tightness int he L upper trap with mulitple trigger points.  tenderness in the anterior/ middle and posterior deltoid  on the L                           PT Education - 03/01/16 1717    Education provided Yes   Education Details evaluation findings, POC, goals, prognosis, HEP with proper form/ rationale.    Person(s) Educated Patient   Methods Explanation;Verbal cues   Comprehension Verbalized understanding;Verbal cues required          PT Short Term Goals - 03/01/16 1734      PT SHORT TERM GOAL #1   Title pt will be I with inital HEP (03/23/2015)   Time 3   Period Weeks   Status New     PT SHORT TERM GOAL #2   Title pt will be able to verbalize and demo techniques to reduce pain and inflammation via RICE (03/23/2015)   Time 3   Period Weeks   Status New     PT SHORT TERM GOAL #3   Title pt will demonstrate improvement in L grip strength by >/= 10# to demonstrate improvement in shoulder function (03/23/2015)   Time 3   Period Weeks   Status New     PT SHORT TERM GOAL #4   Title She will increase shoulder flexion/ abduction by >/= 110 degrees to promote shoulder mobility with </= 4/10 pain (03/23/2015)   Time 3   Period Weeks   Status New           PT Long Term Goals - 03/01/16 1738      PT LONG TERM GOAL #1   Title pt will be I with all HEP given as of last visit (04/26/2016)   Time 6   Period Weeks   Status New     PT LONG TERM GOAL #2   Title pt will increase Shoulder fleixon to >/= 140 degrees and external rotation to >/= 85 degrees with </= 2/10 ( 04/26/2016)   Time 6   Period Weeks   Status New     PT LONG TERM GOAL #3   Title pt will increase L shoulder strength to >/= 4/5 with </2/10 to promote lifting and carrying  activities (04/26/2016)   Time 6   Period Weeks   Status New     PT LONG TERM GOAL #4   Title pt will be able to lift and lower >/= 8# to and from a overhead shelf and pushing/ pulling activities for functional mobility with </= 2/10 pain to 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 Symes presents to OPPT as a moderate complexity evalution based on involved PMHx, fluctuating symptomology, and evaluation findings regarding CC of L shoulder pain s/p Calvicle fx on 12/12/2015. pt demos limited AROm/ PROm of the L shoulder due to pain and muscle tightness. singificant weakness of the L shoulder compared bil. she would benefit from physical therapy to decrease pain, improve shoulder mobility/ strength by addressing the deficits listed below.    Rehab Potential Good   PT Frequency 2x / week   PT Duration 6 weeks   PT Treatment/Interventions ADLs/Self Care Home Management;Cryotherapy;Electrical Stimulation;Iontophoresis 4mg /ml Dexamethasone;Moist Heat;Ultrasound;Patient/family education;Passive range of motion;Taping;Manual techniques;Therapeutic activities;Therapeutic exercise;Neuromuscular re-education;Dry needling   PT Next Visit Plan assess/ review HEP, shoulder mobililty, scapular  stabilizers/ rotator cuffs strengthening, modalities PRN   PT Home Exercise Plan ball squeezes, scapular retraction, table slides for flexion/ abuction   Consulted and Agree with Plan of Care Patient      Patient will benefit from skilled therapeutic intervention in order to improve the following deficits and impairments:  Abnormal gait, Pain, Decreased range of motion, Hypomobility, Decreased strength, Decreased endurance, Decreased activity tolerance, Improper body mechanics, Postural dysfunction, Impaired flexibility, Increased fascial restricitons  Visit Diagnosis: Chronic left  shoulder pain - Plan: PT plan of care cert/re-cert  Stiffness of left shoulder, not elsewhere classified - Plan: PT plan of care cert/re-cert  Muscle weakness (generalized) - Plan: PT plan of care cert/re-cert  Abnormal posture - Plan: PT plan of care cert/re-cert      G-Codes - [id number]    Functional Assessment Tool Used clinical judgement   Functional Limitation Carrying, moving and handling objects   Carrying, Moving and Handling Objects Current Status [id number]) At least 60 percent but less than 80 percent impaired, limited or restricted   Carrying, Moving and Handling Objects Goal Status [id number]) At least 20 percent but less than 40 percent impaired, limited or restricted       Problem List Patient Active Problem List   Diagnosis Date Noted  . Spinal stenosis of lumbar region 11/29/2015  . Positive urine drug screen 11/16/2015  . Chronic pain syndrome 10/12/2015  . Umbilical hernia [id number]  . Sleep difficulties 07/15/2015  . Bunion, right 06/20/2015  . Callus of foot 04/27/2015  . Muscle spasm 04/16/2015  . B12 deficiency 03/30/2015  . Hyperglycemia 03/30/2015  . Hammer toe of left foot 02/16/2015  . Fibrosis of skin of lower extremity 02/16/2015  . Status post right foot surgery 10/26/2014  . Cervical spondylosis with radiculopathy 03/01/2014  . Hammer toe of right foot 01/26/2014  . Benign intracranial hypertension 06/18/2013  . Pain in lower limb 05/22/2013  . Abnormal gait 04/21/2013  . Arthritis 04/21/2013  . Disturbance of skin sensation 04/21/2013  . Cephalalgia 04/21/2013  . Porokeratosis 03/31/2013  . Pain, foot 03/31/2013  . Vitamin D deficiency 04/18/2010  . Fatigue 04/14/2010  . Back pain with right-sided radiculopathy 11/29/2008  . MEDIAL MENISCUS TEAR, LEFT 06/30/2008  . BARRETTS ESOPHAGUS 06/28/2008  . OSTEOARTHRITIS, KNEES, BILATERAL 06/28/2008  . OBESITY 05/24/2008  . HYPERTENSION, BENIGN ESSENTIAL 05/24/2008  . IRRITABLE BOWEL SYNDROME  05/24/2008  . BARIATRIC SURGERY STATUS 03/19/2006  . NEOPLASM, MALIGNANT, COLON, HX OF 03/19/1997   Starr Lake PT, DPT, LAT, ATC  03/01/16  5:47 PM      [facility] [address] [place], [place], [address] Phone: [phone]   Fax:  [phone]  Name: Misty [name] MRN: [id number] Date of Birth: [birth year]

## 2016-03-06 ENCOUNTER — Encounter: Payer: Self-pay | Admitting: Physical Therapy

## 2016-03-06 ENCOUNTER — Ambulatory Visit: Payer: Medicare Other | Admitting: Physical Therapy

## 2016-03-06 DIAGNOSIS — M6281 Muscle weakness (generalized): Secondary | ICD-10-CM

## 2016-03-06 DIAGNOSIS — R293 Abnormal posture: Secondary | ICD-10-CM

## 2016-03-06 DIAGNOSIS — M25612 Stiffness of left shoulder, not elsewhere classified: Secondary | ICD-10-CM

## 2016-03-06 DIAGNOSIS — G8929 Other chronic pain: Secondary | ICD-10-CM | POA: Diagnosis not present

## 2016-03-06 DIAGNOSIS — M25512 Pain in left shoulder: Secondary | ICD-10-CM | POA: Diagnosis not present

## 2016-03-06 NOTE — Therapy (Signed)
[place], [place], [address] Phone: [phone]   Fax:  [phone]  Physical Therapy Treatment  Patient Details  Name: Misty [name] MRN: [id number] Date of Birth: Nov 25, 1960 Referring Provider: Almedia Balls MD  Encounter Date: 03/06/2016      PT End of Session - 03/06/16 1236    Visit Number 2   Number of Visits 13   Date for PT Re-Evaluation 04/26/16   Authorization Type Medicare: kx mod by 15th visit; progress note by 10th visit.    PT Start Time 1150   PT Stop Time 1230   PT Time Calculation (min) 40 min   Activity Tolerance Patient tolerated treatment well   Behavior During Therapy WFL for tasks assessed/performed      Past Medical History:  Diagnosis Date  . Allergy   . Arthritis   . Back pain   . Colon cancer ([place])   . Depression   . Diarrhea   . GERD (gastroesophageal reflux disease)   . Hypertension   . Insomnia   . MS (multiple sclerosis) ([place])   . Other hammer toe (acquired) 03/31/2013  . Pneumonia    hx of  . Umbilical hernia     Past Surgical History:  Procedure Laterality Date  . ABDOMINAL HYSTERECTOMY    . ANTERIOR CERVICAL DECOMP/DISCECTOMY FUSION N/A 03/01/2014   Procedure: ANTERIOR CERVICAL DECOMPRESSION/DISCECTOMY FUSION 1 LEVEL;  Surgeon: Charlie Pitter, MD;  Location: [facility];  Service: Neurosurgery;  Laterality: N/A;  ANTERIOR CERVICAL DECOMPRESSION/DISCECTOMY FUSION 1 LEVEL  . BUNIONECTOMY Right 10/14/2014   '@[facility]'$   . CESAREAN SECTION    . COLON SURGERY    . COLONOSCOPY    . FOOT SURGERY Right   . GASTRIC BYPASS    . Hammer Toe Repair Right 10/14/2014   RT #2, '@[facility]'$   . TENOTOMY Right 10/14/2014   RT #3, '@[facility]'$     There were no vitals filed for this visit.      Subjective Assessment - 03/06/16 1209    Subjective Pt arriving to therapy today complaining of 5/10 L arm pain. Pt s/p L clavicle fx which occurred from a fall on 12/12/15. Pt also reporting fall  3 days ago falling on the R side. Pt reporting R knee pain and low back pain.    Limitations Lifting;House hold activities   How long can you sit comfortably? 2 hours   How long can you stand comfortably? 8 hours   How long can you walk comfortably? unlimited   Diagnostic tests x-ray 2 weeks ago   Patient Stated Goals be able to move the arm again, reduce  pain, be able to lift and carry items without difficulty   Currently in Pain? Yes   Pain Score 5    Pain Location Shoulder   Pain Orientation Left;Anterior;Proximal   Pain Descriptors / Indicators Sore;Aching;Tightness   Pain Type Chronic pain   Pain Onset More than a month ago   Pain Frequency Intermittent   Aggravating Factors  moving arm around, lifting, ADL's   Pain Relieving Factors  pain meds, ice   Multiple Pain Sites Yes   Pain Score 8   Pain Location Back   Pain Orientation Lower   Pain Descriptors / Indicators Burning   Pain Type Chronic pain   Pain Onset More than a month ago   Pain Frequency Constant   Aggravating Factors  sitting long periods, walking, lifting, standing long periods   Pain Relieving Factors pain  meds, heat   Effect of Pain on Daily Activities difficutly doing job of cooking                         [facility] Adult PT Treatment/Exercise - 03/06/16 0001      Exercises   Exercises Shoulder     Shoulder Exercises: Supine   Flexion 10 reps   Theraband Level (Shoulder Flexion) Level 1 (Yellow)   Other Supine Exercises supine shoulder isometrics: IR, ER, extension, and flexion x 10 holding 5 seconds.      Shoulder Exercises: Seated   Retraction 10 reps   Other Seated Exercises table slides: flexion x10, abduction x 10.      Modalities   Modalities Moist Heat     Moist Heat Therapy   Number Minutes Moist Heat 10 Minutes   Moist Heat Location Shoulder;Lumbar Spine                PT Education - 03/06/16 1223    Education provided Yes   Education Details Reviewed HEP and  corrected exercise technique   Person(s) Educated Patient   Methods Explanation;Demonstration;Verbal cues   Comprehension Verbalized understanding;Returned demonstration          PT Short Term Goals - 03/06/16 1240      PT SHORT TERM GOAL #1   Title pt will be I with inital HEP (03/23/2015)   Baseline required verbal and tactile cues to correct form and technique   Time 3   Period Weeks   Status On-going     PT SHORT TERM GOAL #2   Title pt will be able to verbalize and demo techniques to reduce pain and inflammation via RICE (03/23/2015)   Time 3   Period Weeks   Status On-going     PT SHORT TERM GOAL #3   Title pt will demonstrate improvement in L grip strength by >/= 10# to demonstrate improvement in shoulder function (03/23/2015)   Time 3   Period Weeks   Status New     PT SHORT TERM GOAL #4   Title She will increase shoulder flexion/ abduction by >/= 110 degrees to promote shoulder mobility with </= 4/10 pain (03/23/2015)   Time 3   Period Weeks   Status New           PT Long Term Goals - 03/01/16 1738      PT LONG TERM GOAL #1   Title pt will be I with all HEP given as of last visit (04/26/2016)   Time 6   Period Weeks   Status New     PT LONG TERM GOAL #2   Title pt will increase Shoulder fleixon to >/= 140 degrees and external rotation to >/= 85 degrees with </= 2/10 ( 04/26/2016)   Time 6   Period Weeks   Status New     PT LONG TERM GOAL #3   Title pt will increase L shoulder strength to >/= 4/5 with </2/10 to promote lifting and carrying activities (04/26/2016)   Time 6   Period Weeks   Status New     PT LONG TERM GOAL #4   Title pt will be able to lift and lower >/= 8# to and from a overhead shelf and pushing/ pulling activities for functional mobility with </= 2/10 pain to 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 Pt reveiwed all HEP and corrections were made. pt tolerated exercises today in supine due to increased low back pain. Pt reporting a fall 3 days ago and safety techniques were reviewed to prevent future falls.  Skilled therapy needed to continue to progess pt toward her goals set, No goals met at today's treatment.    Rehab Potential Good   PT Frequency 2x / week   PT Duration 6 weeks   PT Treatment/Interventions ADLs/Self Care Home Management;Cryotherapy;Electrical Stimulation;Iontophoresis 25m/ml Dexamethasone;Moist Heat;Ultrasound;Patient/family education;Passive range of motion;Taping;Manual techniques;Therapeutic activities;Therapeutic exercise;Neuromuscular re-education;Dry needling   PT Next Visit Plan assess/ review HEP, shoulder mobililty, scapular stabilizers/ rotator cuffs strengthening, modalities PRN   PT Home Exercise Plan ball squeezes, scapular retraction, table slides for flexion/ abuction   Consulted and Agree with Plan of Care Patient      Patient will benefit from skilled therapeutic intervention in order to improve the following deficits and impairments:  Abnormal gait, Pain, Decreased range of motion, Hypomobility, Decreased strength, Decreased endurance, Decreased activity tolerance, Improper body mechanics, Postural dysfunction, Impaired flexibility, Increased fascial restricitons  Visit Diagnosis: Chronic left shoulder pain  Stiffness of left shoulder, not elsewhere classified  Muscle weakness (generalized)  Abnormal posture     Problem List Patient Active Problem List   Diagnosis Date Noted  . Spinal stenosis of lumbar region 11/29/2015  . Positive urine drug screen 11/16/2015  . Chronic pain syndrome 10/12/2015  . Umbilical hernia [id number] . Sleep difficulties 07/15/2015  . Bunion, right 06/20/2015  . Callus of foot 04/27/2015  . Muscle spasm 04/16/2015  . B12 deficiency 03/30/2015  .  Hyperglycemia 03/30/2015  . Hammer toe of left foot 02/16/2015  . Fibrosis of skin of lower extremity 02/16/2015  . Status post right foot surgery 10/26/2014  . Cervical spondylosis with radiculopathy 03/01/2014  . Hammer toe of right foot 01/26/2014  . Benign intracranial hypertension 06/18/2013  . Pain in lower limb 05/22/2013  . Abnormal gait 04/21/2013  . Arthritis 04/21/2013  . Disturbance of skin sensation 04/21/2013  . Cephalalgia 04/21/2013  . Porokeratosis 03/31/2013  . Pain, foot 03/31/2013  . Vitamin D deficiency 04/18/2010  . Fatigue 04/14/2010  . Back pain with right-sided radiculopathy 11/29/2008  . MEDIAL MENISCUS TEAR, LEFT 06/30/2008  . BARRETTS ESOPHAGUS 06/28/2008  . OSTEOARTHRITIS, KNEES, BILATERAL 06/28/2008  . OBESITY 05/24/2008  . HYPERTENSION, BENIGN ESSENTIAL 05/24/2008  . IRRITABLE BOWEL SYNDROME 05/24/2008  . BARIATRIC SURGERY STATUS 03/19/2006  . NEOPLASM, MALIGNANT, COLON, HX OF 03/19/1997    JQuenton Fetter MPT 03/06/2016, 12:42 PM  [place] [address]Phone: [phone]  Fax:  [phone] Name: Misty PITONESMRN: [id number] of Birth: 1June 17, 1962

## 2016-03-09 ENCOUNTER — Ambulatory Visit: Payer: Medicare Other | Admitting: Physical Therapy

## 2016-03-13 ENCOUNTER — Other Ambulatory Visit: Payer: Self-pay | Admitting: Emergency Medicine

## 2016-03-13 DIAGNOSIS — I10 Essential (primary) hypertension: Secondary | ICD-10-CM

## 2016-03-13 MED ORDER — HYDROCHLOROTHIAZIDE 12.5 MG PO CAPS: 12.5000 mg | ORAL_CAPSULE | Freq: Every day | ORAL | 1 refills | Status: DC

## 2016-03-13 NOTE — Telephone Encounter (Signed)
Received fax from pharmacy stating pt is taking 1 tablet of hydrochlorothiazide, med list states .5 tablet. Please clarify

## 2016-03-15 ENCOUNTER — Ambulatory Visit: Payer: Medicare Other | Admitting: Physical Therapy

## 2016-03-21 DIAGNOSIS — M7502 Adhesive capsulitis of left shoulder: Secondary | ICD-10-CM | POA: Diagnosis not present

## 2016-03-26 DIAGNOSIS — M48062 Spinal stenosis, lumbar region with neurogenic claudication: Secondary | ICD-10-CM | POA: Diagnosis not present

## 2016-03-26 DIAGNOSIS — M5136 Other intervertebral disc degeneration, lumbar region: Secondary | ICD-10-CM | POA: Diagnosis not present

## 2016-03-26 DIAGNOSIS — M7061 Trochanteric bursitis, right hip: Secondary | ICD-10-CM | POA: Diagnosis not present

## 2016-03-30 ENCOUNTER — Ambulatory Visit: Payer: Medicare Other | Attending: Internal Medicine | Admitting: Physical Therapy

## 2016-03-30 DIAGNOSIS — G8929 Other chronic pain: Secondary | ICD-10-CM | POA: Diagnosis not present

## 2016-03-30 DIAGNOSIS — M7062 Trochanteric bursitis, left hip: Secondary | ICD-10-CM | POA: Diagnosis not present

## 2016-03-30 DIAGNOSIS — M5136 Other intervertebral disc degeneration, lumbar region: Secondary | ICD-10-CM | POA: Diagnosis not present

## 2016-03-30 DIAGNOSIS — M25612 Stiffness of left shoulder, not elsewhere classified: Secondary | ICD-10-CM | POA: Diagnosis not present

## 2016-03-30 DIAGNOSIS — M6281 Muscle weakness (generalized): Secondary | ICD-10-CM | POA: Insufficient documentation

## 2016-03-30 DIAGNOSIS — M545 Low back pain: Secondary | ICD-10-CM | POA: Insufficient documentation

## 2016-03-30 DIAGNOSIS — R293 Abnormal posture: Secondary | ICD-10-CM | POA: Diagnosis not present

## 2016-03-30 DIAGNOSIS — M62830 Muscle spasm of back: Secondary | ICD-10-CM | POA: Diagnosis not present

## 2016-03-30 DIAGNOSIS — M25512 Pain in left shoulder: Secondary | ICD-10-CM | POA: Insufficient documentation

## 2016-03-30 NOTE — Therapy (Signed)
[place], [place], [address] Phone: [phone]   Fax:  [phone]  Physical Therapy Treatment / Re-evaluation   Patient Details  Name: Misty [name] MRN: [id number] Date of Birth: September 11, 1960 Referring Provider: Rennis Harding MD  Encounter Date: 03/30/2016      PT End of Session - 03/30/16 1215    Visit Number 3   Number of Visits 17   Date for PT Re-Evaluation 05/11/16   Authorization Type Medicare: kx mod by 15th visit; progress note by 10th visit.    PT Start Time 1017   PT Stop Time 1105   PT Time Calculation (min) 48 min   Activity Tolerance Patient tolerated treatment well   Behavior During Therapy WFL for tasks assessed/performed      Past Medical History:  Diagnosis Date  . Allergy   . Arthritis   . Back pain   . Colon cancer ([place])   . Depression   . Diarrhea   . GERD (gastroesophageal reflux disease)   . Hypertension   . Insomnia   . MS (multiple sclerosis) ([place])   . Other hammer toe (acquired) 03/31/2013  . Pneumonia    hx of  . Umbilical hernia     Past Surgical History:  Procedure Laterality Date  . ABDOMINAL HYSTERECTOMY    . ANTERIOR CERVICAL DECOMP/DISCECTOMY FUSION N/A 03/01/2014   Procedure: ANTERIOR CERVICAL DECOMPRESSION/DISCECTOMY FUSION 1 LEVEL;  Surgeon: Charlie Pitter, MD;  Location: [name];  Service: Neurosurgery;  Laterality: N/A;  ANTERIOR CERVICAL DECOMPRESSION/DISCECTOMY FUSION 1 LEVEL  . BUNIONECTOMY Right 10/14/2014   _0   . CESAREAN SECTION    . COLON SURGERY    . COLONOSCOPY    . FOOT SURGERY Right   . GASTRIC BYPASS    . Hammer Toe Repair Right 10/14/2014   RT #2, _1   . TENOTOMY Right 10/14/2014   RT #3, _2     There were no vitals filed for this visit.      Subjective Assessment - 03/30/16 1019    Subjective "I missed my last 2-3 appointments due to deaths in the family". pt arrived to treatment today with a new referral for lumbar  spine.  Lumbar spine pain started "years" ago, with pain recent increase in pain in September following the fall when she broke her collar bone. she uses a lumbar brace which helps for pain.  reports referral of pain down both legs with recent injection in the R hip which helped relieve pressure, denies bowel or bladder symptoms. since the fall the pain in the back as gotten worse.     Currently in Pain? Yes   Pain Score 3    Pain Orientation Left;Anterior;Proximal   Pain Descriptors / Indicators Heaviness   Pain Type Chronic pain   Pain Onset More than a month ago   Pain Frequency Intermittent   Aggravating Factors  trying to use the arm,   Pain Relieving Factors resting, heat at night,    Pain Score 8   Pain Location Back   Pain Orientation Lower;Mid;Right   Pain Descriptors / Indicators Stabbing;Aching;Pressure   Pain Type Chronic pain   Pain Onset More than a month ago   Pain Frequency Constant   Aggravating Factors  walking, lifting, any activity   Pain Relieving Factors ice/ heat, pain meds, Brace            [facility] PT Assessment - 03/30/16 0001      Assessment  Medical Diagnosis low back pain    Referring Provider Rennis Harding MD   Hand Dominance Right   Prior Therapy yes     Precautions   Precautions None     Restrictions   Weight Bearing Restrictions No     Home Environment   Living Environment Private residence   Living Arrangements Spouse/significant other;Alone   Available Help at Discharge Available PRN/intermittently   Type of Home Apartment   Home Access Stairs to enter   Entrance Stairs-Number of Steps 4   Entrance Stairs-Rails None   Home Layout Two level   Alternate Level Stairs-Number of Steps 17   Alternate Level Stairs-Rails Right     Posture/Postural Control   Postural Limitations Decreased lumbar lordosis;Forward head;Rounded Shoulders     AROM   AROM Assessment Site Shoulder;Lumbar   Left Shoulder Extension 51 Degrees   Left Shoulder  Flexion 130 Degrees   Left Shoulder ABduction 100 Degrees   Left Shoulder Internal Rotation 60 Degrees   Left Shoulder External Rotation 63 Degrees   Lumbar Flexion 52  pain coming back to neutral   Lumbar Extension 12  pain coming back to neutral   Lumbar - Right Side Bend 12   Lumbar - Left Side Bend 10     PROM   Right/Left Shoulder --     Palpation   Palpation comment spasm in the R thoracolumbar paraspinals with pain, hypomobility with PAIVM, soreness at R PSIS and along sacrum.      Special Tests    Special Tests Lumbar   Lumbar Tests Prone Knee Bend Test;Straight Leg Raise     Prone Knee Bend Test   Findings Positive   Side Right   Comment tightness in front of thigh     Straight Leg Raise   Findings Negative   Comment Hamstring tightness, but no referral                      [facility] Adult PT Treatment/Exercise - 03/30/16 0001      Lumbar Exercises: Stretches   Lower Trunk Rotation --  2 x 15     Knee/Hip Exercises: Stretches   Active Hamstring Stretch 3 reps;30 seconds;Right  contract/relax with 10 s hold     Knee/Hip Exercises: Supine   Straight Leg Raises AROM;Strengthening;Right;2 sets;10 reps     Manual Therapy   Manual Therapy Muscle Energy Technique   Muscle Energy Technique R hip flexor MET with scissoring technique   1 x 5 with 5 sec hold                PT Education - 03/30/16 1214    Education provided Yes   Education Details reviewed HEP for shoulder, evaluation findings for the low back, POC, goals, HEP with proper form/ rationale   Person(s) Educated Patient   Methods Explanation;Verbal cues;Handout   Comprehension Verbalized understanding;Verbal cues required          PT Short Term Goals - 03/30/16 1233      PT SHORT TERM GOAL #1   Title pt will be I with inital HEP (03/23/2015)   Time 3   Period Weeks   Status On-going     PT SHORT TERM GOAL #2   Title pt will be able to verbalize and demo techniques to  reduce pain and inflammation via RICE (03/23/2015)   Time 3   Period Weeks   Status On-going     PT SHORT TERM GOAL #3  Title pt will demonstrate improvement in L grip strength by >/= 10# to demonstrate improvement in shoulder function (03/23/2015)   Time 3   Period Weeks   Status On-going     PT SHORT TERM GOAL #4   Title She will increase shoulder flexion/ abduction by >/= 110 degrees to promote shoulder mobility with </= 4/10 pain (03/23/2015)   Time 3   Period Weeks   Status On-going     PT SHORT TERM GOAL #5   Title she will demonstrate reduced lumbar spine paraspinal tightness and promote decreased pain to </= 4/10 and promote trunk mobility    Time 3   Period Weeks   Status New           PT Long Term Goals - 03/30/16 1235      PT LONG TERM GOAL #1   Title pt will be I with all HEP given as of last visit (04/26/2016)   Time 6   Period Weeks   Status On-going     PT LONG TERM GOAL #2   Title pt will increase Shoulder fleixon to >/= 140 degrees and external rotation to >/= 85 degrees with </= 2/10 ( 04/26/2016)   Time 6   Period Weeks   Status On-going     PT LONG TERM GOAL #3   Title pt will increase L shoulder strength to >/= 4/5 with </2/10 to promote lifting and carrying activities (04/26/2016)   Time 6   Period Weeks   Status On-going     PT LONG TERM GOAL #4   Title pt will be able to lift and lower >/= 8# to and from a overhead shelf and pushing/ pulling activities for functional mobility with </= 2/10 pain 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    Time 6   Period Weeks               Plan - 03/30/16 1216    Clinical Impression Statement Mrs. Balik presents to OPPT with anew script to add her  low back to her POC. She demonstrates limited mobility in the low back secondary to tightness and pain. spasm in the thoracolumbar paraspinals with limited passive accessory at L1-L5 and tenderness at the R PSIS. she would benefit treatment for her low back to promote reduce pain and tightness and improve mobility.    PT Frequency 2x / week   PT Duration 8 weeks   PT Treatment/Interventions ADLs/Self Care Home Management;Cryotherapy;Electrical Stimulation;Iontophoresis '4mg'$ /ml Dexamethasone;Moist Heat;Ultrasound;Patient/family education;Passive range of motion;Taping;Manual techniques;Therapeutic activities;Therapeutic exercise;Neuromuscular re-education;Dry needling   PT Next Visit Plan assess/ review HEP, shoulder mobililty, scapular stabilizers/ rotator cuffs strengthening, modalities PRN, hamstring stretching, hip flexor strengthening. manual for low back.    PT Home Exercise Plan ball squeezes, scapular retraction, table slides for flexion/ abuction, LTR, hamstring stretching, SLR,    Consulted and Agree with Plan of Care Patient      Patient will benefit from skilled therapeutic intervention in order to improve the following deficits and impairments:  Abnormal gait, Pain, Decreased range of motion, Hypomobility, Decreased strength, Decreased endurance, Decreased activity tolerance, Improper body mechanics, Postural dysfunction, Impaired flexibility, Increased fascial restricitons  Visit Diagnosis: Chronic  left shoulder pain - Plan: PT plan of care cert/re-cert  Stiffness of left shoulder, not elsewhere classified - Plan: PT plan of care cert/re-cert  Muscle weakness (generalized) - Plan: PT plan of care cert/re-cert  Abnormal posture - Plan: PT plan of care cert/re-cert  Chronic bilateral low back pain, with sciatica presence unspecified - Plan: PT plan of care cert/re-cert  Muscle spasm of back - Plan: PT plan of care cert/re-cert       G-Codes - Apr 07, 2016 1236    Functional  Assessment Tool Used clinical judgement   Functional Limitation Carrying, moving and handling objects   Carrying, Moving and Handling Objects Current Status (Z6010) At least 60 percent but less than 80 percent impaired, limited or restricted   Carrying, Moving and Handling Objects Goal Status (X3235) At least 20 percent but less than 40 percent impaired, limited or restricted      Problem List Patient Active Problem List   Diagnosis Date Noted  . Spinal stenosis of lumbar region 11/29/2015  . Positive urine drug screen 11/16/2015  . Chronic pain syndrome 10/12/2015  . Umbilical hernia 57/32/2025  . Sleep difficulties 07/15/2015  . Bunion, right 06/20/2015  . Callus of foot 04/27/2015  . Muscle spasm 04/16/2015  . B12 deficiency 03/30/2015  . Hyperglycemia 03/30/2015  . Hammer toe of left foot 02/16/2015  . Fibrosis of skin of lower extremity 02/16/2015  . Status post right foot surgery 10/26/2014  . Cervical spondylosis with radiculopathy 03/01/2014  . Hammer toe of right foot 01/26/2014  . Benign intracranial hypertension 06/18/2013  . Pain in lower limb 05/22/2013  . Abnormal gait 04/21/2013  . Arthritis 04/21/2013  . Disturbance of skin sensation 04/21/2013  . Cephalalgia 04/21/2013  . Porokeratosis 03/31/2013  . Pain, foot 03/31/2013  . Vitamin D deficiency 04/18/2010  . Fatigue 04/14/2010  . Back pain with right-sided radiculopathy 11/29/2008  . MEDIAL MENISCUS TEAR, LEFT 06/30/2008  . BARRETTS ESOPHAGUS 06/28/2008  . OSTEOARTHRITIS, KNEES, BILATERAL 06/28/2008  . OBESITY 05/24/2008  . HYPERTENSION, BENIGN ESSENTIAL 05/24/2008  . IRRITABLE BOWEL SYNDROME 05/24/2008  . BARIATRIC SURGERY STATUS 03/19/2006  . NEOPLASM, MALIGNANT, COLON, HX OF 03/19/1997   [place]arr [place] PT, DPT, LAT, ATC  04/07/2016  12:40 PM      [facility] [address] [place], [place], [address] Phone: [phone]   Fax:   [phone]  Name: LAVEAH GLOSTER MRN: [id number] Date of Birth: 1960-09-04

## 2016-04-04 ENCOUNTER — Ambulatory Visit: Payer: Medicare Other | Admitting: Physical Therapy

## 2016-04-06 ENCOUNTER — Ambulatory Visit: Payer: Medicare Other | Admitting: Physical Therapy

## 2016-04-06 ENCOUNTER — Encounter: Payer: Self-pay | Admitting: Physical Therapy

## 2016-04-06 DIAGNOSIS — M25612 Stiffness of left shoulder, not elsewhere classified: Secondary | ICD-10-CM | POA: Diagnosis not present

## 2016-04-06 DIAGNOSIS — M545 Low back pain: Secondary | ICD-10-CM | POA: Diagnosis not present

## 2016-04-06 DIAGNOSIS — R293 Abnormal posture: Secondary | ICD-10-CM

## 2016-04-06 DIAGNOSIS — G8929 Other chronic pain: Secondary | ICD-10-CM

## 2016-04-06 DIAGNOSIS — M6281 Muscle weakness (generalized): Secondary | ICD-10-CM

## 2016-04-06 DIAGNOSIS — M25512 Pain in left shoulder: Secondary | ICD-10-CM | POA: Diagnosis not present

## 2016-04-06 DIAGNOSIS — M62830 Muscle spasm of back: Secondary | ICD-10-CM

## 2016-04-06 NOTE — Therapy (Signed)
[place], [place], [address] Phone: [phone]   Fax:  [phone]  Physical Therapy Treatment  Patient Details  Name: Misty [name] MRN: [id number] Date of Birth: July 16, 1960 Referring Provider: Rennis Harding MD  Encounter Date: 04/06/2016      PT End of Session - 04/06/16 1242    Visit Number 4   Number of Visits 17   Date for PT Re-Evaluation 05/11/16   Authorization Type Medicare: kx mod by 15th visit; progress note by 10th visit.    PT Start Time 1235   PT Stop Time 1317   PT Time Calculation (min) 42 min   Activity Tolerance Patient tolerated treatment well   Behavior During Therapy WFL for tasks assessed/performed      Past Medical History:  Diagnosis Date  . Allergy   . Arthritis   . Back pain   . Colon cancer ([place])   . Depression   . Diarrhea   . GERD (gastroesophageal reflux disease)   . Hypertension   . Insomnia   . MS (multiple sclerosis) ([place])   . Other hammer toe (acquired) 03/31/2013  . Pneumonia    hx of  . Umbilical hernia     Past Surgical History:  Procedure Laterality Date  . ABDOMINAL HYSTERECTOMY    . ANTERIOR CERVICAL DECOMP/DISCECTOMY FUSION N/A 03/01/2014   Procedure: ANTERIOR CERVICAL DECOMPRESSION/DISCECTOMY FUSION 1 LEVEL;  Surgeon: Charlie Pitter, MD;  Location: [facility];  Service: Neurosurgery;  Laterality: N/A;  ANTERIOR CERVICAL DECOMPRESSION/DISCECTOMY FUSION 1 LEVEL  . BUNIONECTOMY Right 10/14/2014   @[facility]   . CESAREAN SECTION    . COLON SURGERY    . COLONOSCOPY    . FOOT SURGERY Right   . GASTRIC BYPASS    . Hammer Toe Repair Right 10/14/2014   RT #2, @[facility]   . TENOTOMY Right 10/14/2014   RT #3, @[facility]     There were no vitals filed for this visit.      Subjective Assessment - 04/06/16 1238    Subjective Pt reports getting injection in L hip since last visit (R hip was before last visit). Reports feeling a spasm/catch in groin both L and R that  happen at different times. Worse following sitting for extended periods.  Still doing stretches and exercises for shoulder, it is doing well. Took pain medications about 20 min ago.    Currently in Pain? No/denies   Pain Location Shoulder   Pain Score 5   Pain Location Back   Pain Orientation Right;Left;Lower   Pain Descriptors / Indicators Sore   Pain Radiating Towards bilateral hips                         [facility] Adult PT Treatment/Exercise - 04/06/16 0001      Therapeutic Activites    Therapeutic Activities Other Therapeutic Activities   Other Therapeutic Activities postural training in seated and standing     Exercises   Exercises Knee/Hip;Other Exercises     Lumbar Exercises: Stretches   Lower Trunk Rotation Limitations cues to relax 5x each     Knee/Hip Exercises: Stretches   Passive Hamstring Stretch Both;30 seconds   Passive Hamstring Stretch Limitations supine with strap   Gastroc Stretch 2 reps;30 seconds   Gastroc Stretch Limitations slant board     Knee/Hip Exercises: Aerobic   Nustep 5 min L4     Knee/Hip Exercises: Supine   Bridges with [employer] 20 reps  Knee Flexion Limitations transverse abdominis engagement with heel slides   Other Supine Knee/Hip Exercises supine abdominal engagement with ball squeeze     Knee/Hip Exercises: Sidelying   Clams x20 each, lift to hip height                  PT Short Term Goals - 03/30/16 1233      PT SHORT TERM GOAL #1   Title pt will be I with inital HEP (03/23/2015)   Time 3   Period Weeks   Status On-going     PT SHORT TERM GOAL #2   Title pt will be able to verbalize and demo techniques to reduce pain and inflammation via RICE (03/23/2015)   Time 3   Period Weeks   Status On-going     PT SHORT TERM GOAL #3   Title pt will demonstrate improvement in L grip strength by >/= 10# to demonstrate improvement in shoulder function (03/23/2015)   Time 3   Period Weeks   Status On-going      PT SHORT TERM GOAL #4   Title She will increase shoulder flexion/ abduction by >/= 110 degrees to promote shoulder mobility with </= 4/10 pain (03/23/2015)   Time 3   Period Weeks   Status On-going     PT SHORT TERM GOAL #5   Title she will demonstrate reduced lumbar spine paraspinal tightness and promote decreased pain to </= 4/10 and promote trunk mobility    Time 3   Period Weeks   Status New           PT Long Term Goals - 03/30/16 1235      PT LONG TERM GOAL #1   Title pt will be I with all HEP given as of last visit (04/26/2016)   Time 6   Period Weeks   Status On-going     PT LONG TERM GOAL #2   Title pt will increase Shoulder fleixon to >/= 140 degrees and external rotation to >/= 85 degrees with </= 2/10 ( 04/26/2016)   Time 6   Period Weeks   Status On-going     PT LONG TERM GOAL #3   Title pt will increase L shoulder strength to >/= 4/5 with </2/10 to promote lifting and carrying activities (04/26/2016)   Time 6   Period Weeks   Status On-going     PT LONG TERM GOAL #4   Title pt will be able to lift and lower >/= 8# to and from a overhead shelf and pushing/ pulling activities for functional mobility with </= 2/10 pain 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    Time 6   Period Weeks               Plan - 04/06/16 1319    Clinical Impression Statement Worked on activation of hip and abdominal musculature to decrease pain in low back. Pt verbalized decrease in pain following treatment and improved ability to provide support to spine. Focus was on back pain today.    PT Next Visit Plan assess/ review HEP, shoulder mobililty, scapular stabilizers/ rotator cuffs strengthening, modalities PRN, hamstring  stretching, hip flexor strengthening. manual  for low back.    Consulted and Agree with Plan of Care Patient      Patient will benefit from skilled therapeutic intervention in order to improve the following deficits and impairments:     Visit Diagnosis: Chronic left shoulder pain  Stiffness of left shoulder, not elsewhere classified  Muscle weakness (generalized)  Abnormal posture  Chronic bilateral low back pain, with sciatica presence unspecified  Muscle spasm of back     Problem List Patient Active Problem List   Diagnosis Date Noted  . Spinal stenosis of lumbar region 11/29/2015  . Positive urine drug screen 11/16/2015  . Chronic pain syndrome 10/12/2015  . Umbilical hernia [id number]  . Sleep difficulties 07/15/2015  . Bunion, right 06/20/2015  . Callus of foot 04/27/2015  . Muscle spasm 04/16/2015  . B12 deficiency 03/30/2015  . Hyperglycemia 03/30/2015  . Hammer toe of left foot 02/16/2015  . Fibrosis of skin of lower extremity 02/16/2015  . Status post right foot surgery 10/26/2014  . Cervical spondylosis with radiculopathy 03/01/2014  . Hammer toe of right foot 01/26/2014  . Benign intracranial hypertension 06/18/2013  . Pain in lower limb 05/22/2013  . Abnormal gait 04/21/2013  . Arthritis 04/21/2013  . Disturbance of skin sensation 04/21/2013  . Cephalalgia 04/21/2013  . Porokeratosis 03/31/2013  . Pain, foot 03/31/2013  . Vitamin D deficiency 04/18/2010  . Fatigue 04/14/2010  . Back pain with right-sided radiculopathy 11/29/2008  . MEDIAL MENISCUS TEAR, LEFT 06/30/2008  . BARRETTS ESOPHAGUS 06/28/2008  . OSTEOARTHRITIS, KNEES, BILATERAL 06/28/2008  . OBESITY 05/24/2008  . HYPERTENSION, BENIGN ESSENTIAL 05/24/2008  . IRRITABLE BOWEL SYNDROME 05/24/2008  . BARIATRIC SURGERY STATUS 03/19/2006  . NEOPLASM, MALIGNANT, COLON, HX OF 03/19/1997   Mantaj Chamberlin C. Raiya Stainback PT, DPT 04/06/16 1:21 PM   [facility] [address] [place], [place], [address] Phone: [phone]   Fax:  [phone]  Name: Misty [name] MRN: [id number] Date of Birth: 08-15-1960

## 2016-04-11 ENCOUNTER — Ambulatory Visit: Payer: Medicare Other | Admitting: Physical Therapy

## 2016-04-11 DIAGNOSIS — M25512 Pain in left shoulder: Secondary | ICD-10-CM | POA: Diagnosis not present

## 2016-04-11 DIAGNOSIS — M25612 Stiffness of left shoulder, not elsewhere classified: Secondary | ICD-10-CM | POA: Diagnosis not present

## 2016-04-11 DIAGNOSIS — G8929 Other chronic pain: Secondary | ICD-10-CM | POA: Diagnosis not present

## 2016-04-11 DIAGNOSIS — R293 Abnormal posture: Secondary | ICD-10-CM | POA: Diagnosis not present

## 2016-04-11 DIAGNOSIS — M62830 Muscle spasm of back: Secondary | ICD-10-CM

## 2016-04-11 DIAGNOSIS — M545 Low back pain: Secondary | ICD-10-CM

## 2016-04-11 DIAGNOSIS — M6281 Muscle weakness (generalized): Secondary | ICD-10-CM | POA: Diagnosis not present

## 2016-04-11 NOTE — Therapy (Signed)
[place], [place], [address] Phone: [phone]   Fax:  [phone]  Physical Therapy Treatment  Patient Details  Name: Misty [name] MRN: [id number] Date of Birth: 12-11-1960 Referring Provider: Rennis Harding MD  Encounter Date: 04/11/2016      PT End of Session - 04/11/16 1715    Visit Number 5   Number of Visits 17   Date for PT Re-Evaluation 05/11/16   Authorization Type Medicare: kx mod by 15th visit; progress note by 10th visit.    PT Start Time 1630   PT Stop Time 1713   PT Time Calculation (min) 43 min   Activity Tolerance Patient tolerated treatment well   Behavior During Therapy WFL for tasks assessed/performed      Past Medical History:  Diagnosis Date  . Allergy   . Arthritis   . Back pain   . Colon cancer ([place])   . Depression   . Diarrhea   . GERD (gastroesophageal reflux disease)   . Hypertension   . Insomnia   . MS (multiple sclerosis) ([place])   . Other hammer toe (acquired) 03/31/2013  . Pneumonia    hx of  . Umbilical hernia     Past Surgical History:  Procedure Laterality Date  . ABDOMINAL HYSTERECTOMY    . ANTERIOR CERVICAL DECOMP/DISCECTOMY FUSION N/A 03/01/2014   Procedure: ANTERIOR CERVICAL DECOMPRESSION/DISCECTOMY FUSION 1 LEVEL;  Surgeon: Charlie Pitter, MD;  Location: [facility];  Service: Neurosurgery;  Laterality: N/A;  ANTERIOR CERVICAL DECOMPRESSION/DISCECTOMY FUSION 1 LEVEL  . BUNIONECTOMY Right 10/14/2014   _0   . CESAREAN SECTION    . COLON SURGERY    . COLONOSCOPY    . FOOT SURGERY Right   . GASTRIC BYPASS    . Hammer Toe Repair Right 10/14/2014   RT #2, _1   . TENOTOMY Right 10/14/2014   RT #3, _2     There were no vitals filed for this visit.      Subjective Assessment - 04/11/16 1629    Subjective "I am doing well, I am doing my exercises and I and I am also doing a health and wellness class and a dieting class"    Currently in Pain? Yes   Pain Score 0-No pain   Pain Orientation Left   Pain Score 3  took pain pill prior to treatment   Pain Location Back   Pain Orientation Right;Left;Lower   Pain Descriptors / Indicators Sore   Pain Type Chronic pain   Pain Onset More than a month ago   Pain Frequency Constant   Aggravating Factors  walking, lifting, any activity   Pain Relieving Factors ice/ heat, pain meds, Brace                         [facility] Adult PT Treatment/Exercise - 04/11/16 0001      Lumbar Exercises: Stretches   Lower Trunk Rotation 60 seconds  x 5 reps     Lumbar Exercises: Supine   Dead Bug 5 reps  with 10 sec hold     Knee/Hip Exercises: Stretches   Active Hamstring Stretch 3 reps;30 seconds  contract/ relax with 10 sec hold     Knee/Hip Exercises: Aerobic   Nustep L5 x 6 min  UE/LE     Knee/Hip Exercises: Supine   Straight Leg Raises AROM;Strengthening;2 sets;10 reps     Manual Therapy   Manual Therapy Joint mobilization   Manual therapy  comments manual trigger point release over R lumbar paraspinals and DTM over the R lumbar paraspnals   Joint Mobilization LAD of the LLE grade 4   Muscle Energy Technique scissoring technque with R hip flexion and left hipextension 10 x 10 sec hold                PT Education - 04/11/16 1715    Education provided Yes   Education Details updated HEP with proper form and treatment rationale.    Person(s) Educated Patient   Methods Explanation;Verbal cues;Handout   Comprehension Verbal cues required;Verbalized understanding          PT Short Term Goals - 03/30/16 1233      PT SHORT TERM GOAL #1   Title pt will be I with inital HEP (03/23/2015)   Time 3   Period Weeks   Status On-going     PT SHORT TERM GOAL #2   Title pt will be able to verbalize and demo techniques to reduce pain and inflammation via RICE (03/23/2015)   Time 3   Period Weeks   Status On-going     PT SHORT TERM GOAL #3   Title pt will demonstrate  improvement in L grip strength by >/= 10# to demonstrate improvement in shoulder function (03/23/2015)   Time 3   Period Weeks   Status On-going     PT SHORT TERM GOAL #4   Title She will increase shoulder flexion/ abduction by >/= 110 degrees to promote shoulder mobility with </= 4/10 pain (03/23/2015)   Time 3   Period Weeks   Status On-going     PT SHORT TERM GOAL #5   Title she will demonstrate reduced lumbar spine paraspinal tightness and promote decreased pain to </= 4/10 and promote trunk mobility    Time 3   Period Weeks   Status New           PT Long Term Goals - 03/30/16 1235      PT LONG TERM GOAL #1   Title pt will be I with all HEP given as of last visit (04/26/2016)   Time 6   Period Weeks   Status On-going     PT LONG TERM GOAL #2   Title pt will increase Shoulder fleixon to >/= 140 degrees and external rotation to >/= 85 degrees with </= 2/10 ( 04/26/2016)   Time 6   Period Weeks   Status On-going     PT LONG TERM GOAL #3   Title pt will increase L shoulder strength to >/= 4/5 with </2/10 to promote lifting and carrying activities (04/26/2016)   Time 6   Period Weeks   Status On-going     PT LONG TERM GOAL #4   Title pt will be able to lift and lower >/= 8# to and from a overhead shelf and pushing/ pulling activities for functional mobility with </= 2/10 pain 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    Time 6   Period Weeks               Plan - 04/11/16 1715    Clinical Impression Statement Mrs Wilmott reports no issues  with her shoulder and only soreness inthe low back. focused on low back today working on MET involving R hip flexor activation and core strengthening. post session she reported  decreased pain and declined modalities.    PT Next Visit Plan assess/ review HEP, shoulder mobililty, scapular stabilizers/ rotator cuffs strengthening, modalities PRN, hamstring stretching, hip flexor strengthening. manual for low back.    PT Home Exercise Plan ball squeezes, scapular retraction, table slides for flexion/ abuction, LTR, hamstring stretching, SLR, cat/ camel, dead bug   Consulted and Agree with Plan of Care Patient      Patient will benefit from skilled therapeutic intervention in order to improve the following deficits and impairments:  Abnormal gait, Pain, Decreased range of motion, Hypomobility, Decreased strength, Decreased endurance, Decreased activity tolerance, Improper body mechanics, Postural dysfunction, Impaired flexibility, Increased fascial restricitons  Visit Diagnosis: Chronic left shoulder pain  Stiffness of left shoulder, not elsewhere classified  Muscle weakness (generalized)  Abnormal posture  Chronic bilateral low back pain, with sciatica presence unspecified  Muscle spasm of back     Problem List Patient Active Problem List   Diagnosis Date Noted  . Spinal stenosis of lumbar region 11/29/2015  . Positive urine drug screen 11/16/2015  . Chronic pain syndrome 10/12/2015  . Umbilical hernia 14/12/6774  . Sleep difficulties 07/15/2015  . Bunion, right 06/20/2015  . Callus of foot 04/27/2015  . Muscle spasm 04/16/2015  . B12 deficiency 03/30/2015  . Hyperglycemia 03/30/2015  . Hammer toe of left foot 02/16/2015  . Fibrosis of skin of lower extremity 02/16/2015  . Status post right foot surgery 10/26/2014  . Cervical spondylosis with radiculopathy 03/01/2014  . Hammer toe of right foot 01/26/2014  . Benign intracranial hypertension 06/18/2013  . Pain in lower limb 05/22/2013  . Abnormal gait 04/21/2013  . Arthritis 04/21/2013  . Disturbance of skin sensation 04/21/2013  . Cephalalgia 04/21/2013  . Porokeratosis 03/31/2013  . Pain, foot  03/31/2013  . Vitamin D deficiency 04/18/2010  . Fatigue 04/14/2010  . Back pain with right-sided radiculopathy 11/29/2008  . MEDIAL MENISCUS TEAR, LEFT 06/30/2008  . BARRETTS ESOPHAGUS 06/28/2008  . OSTEOARTHRITIS, KNEES, BILATERAL 06/28/2008  . OBESITY 05/24/2008  . HYPERTENSION, BENIGN ESSENTIAL 05/24/2008  . IRRITABLE BOWEL SYNDROME 05/24/2008  . BARIATRIC SURGERY STATUS 03/19/2006  . NEOPLASM, MALIGNANT, COLON, HX OF 03/19/1997   Starr Lake PT, DPT, LAT, ATC  04/11/16  5:19 PM      [place] [facility] [address] [place], [place], [address] Phone: [phone]   Fax:  [phone]  Name: MALAYJA FREUND MRN: [id number] Date of Birth: 18-Dec-1960

## 2016-04-13 ENCOUNTER — Ambulatory Visit: Payer: Medicare Other | Admitting: Physical Therapy

## 2016-04-13 DIAGNOSIS — G8929 Other chronic pain: Secondary | ICD-10-CM | POA: Diagnosis not present

## 2016-04-13 DIAGNOSIS — M25512 Pain in left shoulder: Secondary | ICD-10-CM | POA: Diagnosis not present

## 2016-04-13 DIAGNOSIS — M6281 Muscle weakness (generalized): Secondary | ICD-10-CM | POA: Diagnosis not present

## 2016-04-13 DIAGNOSIS — R293 Abnormal posture: Secondary | ICD-10-CM | POA: Diagnosis not present

## 2016-04-13 DIAGNOSIS — M25612 Stiffness of left shoulder, not elsewhere classified: Secondary | ICD-10-CM | POA: Diagnosis not present

## 2016-04-13 DIAGNOSIS — M545 Low back pain: Secondary | ICD-10-CM

## 2016-04-13 DIAGNOSIS — M62830 Muscle spasm of back: Secondary | ICD-10-CM

## 2016-04-13 NOTE — Therapy (Signed)
[place], [place], [address] Phone: [phone]   Fax:  [phone]  Physical Therapy Treatment  Patient Details  Name: Misty [name] MRN: [id number] Date of Birth: 1960/06/08 Referring Provider: Rennis Harding MD  Encounter Date: 04/13/2016      PT End of Session - 04/13/16 1311    Visit Number 6   Number of Visits 17   Date for PT Re-Evaluation 05/11/16   Authorization Type Medicare: kx mod by 15th visit; progress note by 10th visit.    PT Start Time 1230   PT Stop Time 1311   PT Time Calculation (min) 41 min   Activity Tolerance Patient tolerated treatment well   Behavior During Therapy WFL for tasks assessed/performed      Past Medical History:  Diagnosis Date  . Allergy   . Arthritis   . Back pain   . Colon cancer ([place])   . Depression   . Diarrhea   . GERD (gastroesophageal reflux disease)   . Hypertension   . Insomnia   . MS (multiple sclerosis) ([place])   . Other hammer toe (acquired) 03/31/2013  . Pneumonia    hx of  . Umbilical hernia     Past Surgical History:  Procedure Laterality Date  . ABDOMINAL HYSTERECTOMY    . ANTERIOR CERVICAL DECOMP/DISCECTOMY FUSION N/A 03/01/2014   Procedure: ANTERIOR CERVICAL DECOMPRESSION/DISCECTOMY FUSION 1 LEVEL;  Surgeon: Charlie Pitter, MD;  Location: [facility];  Service: Neurosurgery;  Laterality: N/A;  ANTERIOR CERVICAL DECOMPRESSION/DISCECTOMY FUSION 1 LEVEL  . BUNIONECTOMY Right 10/14/2014   @[facility]   . CESAREAN SECTION    . COLON SURGERY    . COLONOSCOPY    . FOOT SURGERY Right   . GASTRIC BYPASS    . Hammer Toe Repair Right 10/14/2014   RT #2, @[facility]   . TENOTOMY Right 10/14/2014   RT #3, @[facility]     There were no vitals filed for this visit.      Subjective Assessment - 04/13/16 1235    Subjective "[name] kicked my butt last time."   Limitations Lifting;House hold activities   Patient Stated Goals be able to move the arm again, reduce  pain,  be able to lift and carry items without difficulty   Pain Score 0-No pain   Multiple Pain Sites Yes   Pain Score 4   Pain Location Back   Pain Orientation Right   Pain Descriptors / Indicators Sore;Aching   Pain Type Chronic pain   Pain Radiating Towards bil hips   Pain Onset More than a month ago   Pain Frequency Constant   Aggravating Factors  walking, lifting, activities   Pain Relieving Factors ice/heat, pain meds, brace                         [facility] Adult PT Treatment/Exercise - 04/13/16 1238      Lumbar Exercises: Supine   Dead Bug 5 reps  10 sec hold   Bridge 20 reps;5 seconds   Other Supine Lumbar Exercises single limb clam 2x10 bil     Lumbar Exercises: Quadruped   Madcat/Old Horse 10 reps   Straight Leg Raise 10 reps   Straight Leg Raises Limitations alternating; bil     Knee/Hip Exercises: Stretches   Other Knee/Hip Stretches PT performed trunk rotation, hamstring stretch and piriformis stretch 2x30 sec bil     Knee/Hip Exercises: Aerobic   Nustep L5x8 min  Knee/Hip Exercises: Supine   Straight Leg Raises AROM;Strengthening;2 sets;10 reps   Straight Leg Raises Limitations 2#                  PT Short Term Goals - 03/30/16 1233      PT SHORT TERM GOAL #1   Title pt will be I with inital HEP (03/23/2015)   Time 3   Period Weeks   Status On-going     PT SHORT TERM GOAL #2   Title pt will be able to verbalize and demo techniques to reduce pain and inflammation via RICE (03/23/2015)   Time 3   Period Weeks   Status On-going     PT SHORT TERM GOAL #3   Title pt will demonstrate improvement in L grip strength by >/= 10# to demonstrate improvement in shoulder function (03/23/2015)   Time 3   Period Weeks   Status On-going     PT SHORT TERM GOAL #4   Title She will increase shoulder flexion/ abduction by >/= 110 degrees to promote shoulder mobility with </= 4/10 pain (03/23/2015)   Time 3   Period Weeks   Status On-going     PT  SHORT TERM GOAL #5   Title she will demonstrate reduced lumbar spine paraspinal tightness and promote decreased pain to </= 4/10 and promote trunk mobility    Time 3   Period Weeks   Status New           PT Long Term Goals - 03/30/16 1235      PT LONG TERM GOAL #1   Title pt will be I with all HEP given as of last visit (04/26/2016)   Time 6   Period Weeks   Status On-going     PT LONG TERM GOAL #2   Title pt will increase Shoulder fleixon to >/= 140 degrees and external rotation to >/= 85 degrees with </= 2/10 ( 04/26/2016)   Time 6   Period Weeks   Status On-going     PT LONG TERM GOAL #3   Title pt will increase L shoulder strength to >/= 4/5 with </2/10 to promote lifting and carrying activities (04/26/2016)   Time 6   Period Weeks   Status On-going     PT LONG TERM GOAL #4   Title pt will be able to lift and lower >/= 8# to and from a overhead shelf and pushing/ pulling activities for functional mobility with </= 2/10 pain 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    Time 6   Period Weeks               Plan - 04/13/16 1312    Clinical Impression Statement Pt tolerated session well today with focus on increasing lower extremity strength.  Will continue to benefit from PT to maximize function.  Declined heat at end of session.   PT Treatment/Interventions ADLs/Self Care Home Management;Cryotherapy;Electrical Stimulation;Iontophoresis 4mg /ml Dexamethasone;Moist Heat;Ultrasound;Patient/family education;Passive range of motion;Taping;Manual techniques;Therapeutic activities;Therapeutic exercise;Neuromuscular re-education;Dry needling   PT Next Visit Plan shoulder mobililty, scapular stabilizers/ rotator cuffs  strengthening, modalities PRN, hamstring stretching, hip flexor strengthening. manual for low back.    PT Home  Exercise Plan ball squeezes, scapular retraction, table slides for flexion/ abuction, LTR, hamstring stretching, SLR, cat/ camel, dead bug   Consulted and Agree with Plan of Care Patient      Patient will benefit from skilled therapeutic intervention in order to improve the following deficits and impairments:  Abnormal gait, Pain, Decreased range of motion, Hypomobility, Decreased strength, Decreased endurance, Decreased activity tolerance, Improper body mechanics, Postural dysfunction, Impaired flexibility, Increased fascial restricitons  Visit Diagnosis: Chronic left shoulder pain  Stiffness of left shoulder, not elsewhere classified  Muscle weakness (generalized)  Abnormal posture  Chronic bilateral low back pain, with sciatica presence unspecified  Muscle spasm of back     Problem List Patient Active Problem List   Diagnosis Date Noted  . Spinal stenosis of lumbar region 11/29/2015  . Positive urine drug screen 11/16/2015  . Chronic pain syndrome 10/12/2015  . Umbilical hernia [id number]  . Sleep difficulties 07/15/2015  . Bunion, right 06/20/2015  . Callus of foot 04/27/2015  . Muscle spasm 04/16/2015  . B12 deficiency 03/30/2015  . Hyperglycemia 03/30/2015  . Hammer toe of left foot 02/16/2015  . Fibrosis of skin of lower extremity 02/16/2015  . Status post right foot surgery 10/26/2014  . Cervical spondylosis with radiculopathy 03/01/2014  . Hammer toe of right foot 01/26/2014  . Benign intracranial hypertension 06/18/2013  . Pain in lower limb 05/22/2013  . Abnormal gait 04/21/2013  . Arthritis 04/21/2013  . Disturbance of skin sensation 04/21/2013  . Cephalalgia 04/21/2013  . Porokeratosis 03/31/2013  . Pain, foot 03/31/2013  . Vitamin D deficiency 04/18/2010  . Fatigue 04/14/2010  . Back pain with right-sided radiculopathy 11/29/2008  . MEDIAL  MENISCUS TEAR, LEFT 06/30/2008  . BARRETTS ESOPHAGUS 06/28/2008  . OSTEOARTHRITIS, KNEES, BILATERAL 06/28/2008  . OBESITY 05/24/2008  . HYPERTENSION, BENIGN ESSENTIAL 05/24/2008  . IRRITABLE BOWEL SYNDROME 05/24/2008  . BARIATRIC SURGERY STATUS 03/19/2006  . NEOPLASM, MALIGNANT, COLON, HX OF 03/19/1997     Laureen Abrahams, PT, DPT 04/13/16 1:14 PM    [facility] [address] [place], [place], [address] Phone: [phone]   Fax:  [phone]  Name: Misty [name] MRN: [id number] Date of Birth: 11-11-1960

## 2016-04-18 ENCOUNTER — Telehealth: Payer: Self-pay | Admitting: Physical Therapy

## 2016-04-18 ENCOUNTER — Ambulatory Visit: Payer: Medicare Other | Admitting: Physical Therapy

## 2016-04-18 NOTE — Telephone Encounter (Signed)
LVM regarding missed visit today and when her next visit is. If she can't make it to her appointment then to call and cancel.

## 2016-04-19 NOTE — Progress Notes (Signed)
The child sodium   Subjective:    Patient ID: Misty [name], female    DOB: 1960-03-21, 56 y.o.   MRN: [id number]  HPI  NO SHOW  Medications and allergies reviewed with patient and updated if appropriate.  Patient Active Problem List   Diagnosis Date Noted  . Spinal stenosis of lumbar region 11/29/2015  . Positive urine drug screen 11/16/2015  . Chronic pain syndrome 10/12/2015  . Umbilical hernia 32/99/2426  . Sleep difficulties 07/15/2015  . Bunion, right 06/20/2015  . Callus of foot 04/27/2015  . Muscle spasm 04/16/2015  . B12 deficiency 03/30/2015  . Hyperglycemia 03/30/2015  . Hammer toe of left foot 02/16/2015  . Fibrosis of skin of lower extremity 02/16/2015  . Status post right foot surgery 10/26/2014  . Cervical spondylosis with radiculopathy 03/01/2014  . Hammer toe of right foot 01/26/2014  . Benign intracranial hypertension 06/18/2013  . Pain in lower limb 05/22/2013  . Abnormal gait 04/21/2013  . Arthritis 04/21/2013  . Disturbance of skin sensation 04/21/2013  . Cephalalgia 04/21/2013  . Porokeratosis 03/31/2013  . Pain, foot 03/31/2013  . Vitamin D deficiency 04/18/2010  . Fatigue 04/14/2010  . Back pain with right-sided radiculopathy 11/29/2008  . MEDIAL MENISCUS TEAR, LEFT 06/30/2008  . BARRETTS ESOPHAGUS 06/28/2008  . OSTEOARTHRITIS, KNEES, BILATERAL 06/28/2008  . OBESITY 05/24/2008  . HYPERTENSION, BENIGN ESSENTIAL 05/24/2008  . IRRITABLE BOWEL SYNDROME 05/24/2008  . BARIATRIC SURGERY STATUS 03/19/2006  . NEOPLASM, MALIGNANT, COLON, HX OF 03/19/1997    Current Outpatient Prescriptions on File Prior to Visit  Medication Sig Dispense Refill  . aspirin 81 MG tablet Take 81 mg by mouth daily.    . baclofen (LIORESAL) 10 MG tablet Take 10 mg by mouth 2 (two) times daily.    . Cholecalciferol (VITAMIN D) 2000 units CAPS Take 1 capsule (2,000 Units total) by mouth daily. 90 capsule 1  . Cyanocobalamin (B-12) 1000 MCG/ML KIT Inject 1,000 mcg as  directed every 30 (thirty) days. 1 kit 6  . hydrochlorothiazide (MICROZIDE) 12.5 MG capsule Take 1 capsule (12.5 mg total) by mouth daily. 90 capsule 1  . lisinopril (PRINIVIL,ZESTRIL) 20 MG tablet Take 1 tablet (20 mg total) by mouth daily. 90 tablet 3  . oxyCODONE-acetaminophen (PERCOCET) 7.5-325 MG tablet Take 1 tablet by mouth every 4 (four) hours as needed for severe pain.    . VENTOLIN HFA 108 (90 Base) MCG/ACT inhaler Inhale 1 puff into the lungs every 6 (six) hours as needed for wheezing or shortness of breath. 1 Inhaler 2  . vitamin B-6 (PYRIDOXINE) 25 MG tablet Take 25 mg by mouth daily.     Current Facility-Administered Medications on File Prior to Visit  Medication Dose Route Frequency Provider Last Rate Last Dose  . cyanocobalamin ((VITAMIN B-12)) injection 1,000 mcg  1,000 mcg Intramuscular Weekly [name], MD   1,000 mcg at 06/03/15 1435    Past Medical History:  Diagnosis Date  . Allergy   . Arthritis   . Back pain   . Colon cancer ([place])   . Depression   . Diarrhea   . GERD (gastroesophageal reflux disease)   . Hypertension   . Insomnia   . MS (multiple sclerosis) ([place])   . Other hammer toe (acquired) 03/31/2013  . Pneumonia    hx of  . Umbilical hernia     Past Surgical History:  Procedure Laterality Date  . ABDOMINAL HYSTERECTOMY    . ANTERIOR CERVICAL DECOMP/DISCECTOMY FUSION N/A 03/01/2014   Procedure:  ANTERIOR CERVICAL DECOMPRESSION/DISCECTOMY FUSION 1 LEVEL;  Surgeon: Charlie Pitter, MD;  Location: [facility];  Service: Neurosurgery;  Laterality: N/A;  ANTERIOR CERVICAL DECOMPRESSION/DISCECTOMY FUSION 1 LEVEL  . BUNIONECTOMY Right 10/14/2014   @[facility]   . CESAREAN SECTION    . COLON SURGERY    . COLONOSCOPY    . FOOT SURGERY Right   . GASTRIC BYPASS    . Hammer Toe Repair Right 10/14/2014   RT #2, @[facility]   . TENOTOMY Right 10/14/2014   RT #3, @[facility]     Social History   Social History  . Marital status: Single    Spouse name: N/A  . Number of  children: N/A  . Years of education: N/A   Social History Main Topics  . Smoking status: Current Some Day Smoker    Packs/day: 0.25    Years: 20.00    Types: Cigarettes  . Smokeless tobacco: Never Used  . Alcohol use 0.0 oz/week     Comment: 1/2 pint daily  . Drug use: No  . Sexual activity: Yes    Partners: Male   Other Topics Concern  . Not on file   Social History Narrative  . No narrative on file    Family History  Problem Relation Age of Onset  . Stroke Mother   . Hypertension Mother   . Hyperlipidemia Mother   . Diabetes Mother   . Diabetes Brother   . Hypertension Brother   . Hypertension Brother   . Hypertension Brother   . Hypertension Brother   . Hypertension Brother   . Alcoholism Brother     Review of Systems     Objective:  There were no vitals filed for this visit. Wt Readings from Last 3 Encounters:  01/18/16 246 lb (111.6 kg)  11/04/15 237 lb (107.5 kg)  10/12/15 226 lb (102.5 kg)   There is no height or weight on file to calculate BMI.   Physical Exam         Assessment & Plan:       This encounter was created in error - please disregard.

## 2016-04-20 ENCOUNTER — Encounter: Payer: Medicare Other | Admitting: Internal Medicine

## 2016-04-20 ENCOUNTER — Ambulatory Visit: Payer: Medicare Other | Attending: Internal Medicine | Admitting: Physical Therapy

## 2016-04-20 DIAGNOSIS — M25612 Stiffness of left shoulder, not elsewhere classified: Secondary | ICD-10-CM | POA: Diagnosis not present

## 2016-04-20 DIAGNOSIS — M62830 Muscle spasm of back: Secondary | ICD-10-CM | POA: Diagnosis not present

## 2016-04-20 DIAGNOSIS — M545 Low back pain: Secondary | ICD-10-CM | POA: Diagnosis not present

## 2016-04-20 DIAGNOSIS — R293 Abnormal posture: Secondary | ICD-10-CM | POA: Insufficient documentation

## 2016-04-20 DIAGNOSIS — M25512 Pain in left shoulder: Secondary | ICD-10-CM | POA: Insufficient documentation

## 2016-04-20 DIAGNOSIS — M6281 Muscle weakness (generalized): Secondary | ICD-10-CM | POA: Insufficient documentation

## 2016-04-20 DIAGNOSIS — G8929 Other chronic pain: Secondary | ICD-10-CM

## 2016-04-20 NOTE — Therapy (Addendum)
[place], [place], [address] Phone: [phone]   Fax:  [phone]  Physical Therapy Treatment / Discharge Note  Patient Details  Name: Misty [name] MRN: [id number] Date of Birth: 1960-09-21 Referring Provider: Rennis Harding MD  Encounter Date: 04/20/2016      PT End of Session - 04/20/16 1226    Visit Number 7   Number of Visits 17   Date for PT Re-Evaluation 05/11/16   PT Start Time 0934   PT Stop Time 1015   PT Time Calculation (min) 41 min   Activity Tolerance Patient tolerated treatment well   Behavior During Therapy [facility] for tasks assessed/performed      Past Medical History:  Diagnosis Date  . Allergy   . Arthritis   . Back pain   . Colon cancer ([place])   . Depression   . Diarrhea   . GERD (gastroesophageal reflux disease)   . Hypertension   . Insomnia   . MS (multiple sclerosis) ([place])   . Other hammer toe (acquired) 03/31/2013  . Pneumonia    hx of  . Umbilical hernia     Past Surgical History:  Procedure Laterality Date  . ABDOMINAL HYSTERECTOMY    . ANTERIOR CERVICAL DECOMP/DISCECTOMY FUSION N/A 03/01/2014   Procedure: ANTERIOR CERVICAL DECOMPRESSION/DISCECTOMY FUSION 1 LEVEL;  Surgeon: Charlie Pitter, MD;  Location: [facility];  Service: Neurosurgery;  Laterality: N/A;  ANTERIOR CERVICAL DECOMPRESSION/DISCECTOMY FUSION 1 LEVEL  . BUNIONECTOMY Right 10/14/2014   '@[facility]'$   . CESAREAN SECTION    . COLON SURGERY    . COLONOSCOPY    . FOOT SURGERY Right   . GASTRIC BYPASS    . Hammer Toe Repair Right 10/14/2014   RT #2, '@[facility]'$   . TENOTOMY Right 10/14/2014   RT #3, '@[facility]'$     There were no vitals filed for this visit.      Subjective Assessment - 04/20/16 0935    Subjective "Have not been feeling too good. I have been having a lot of muscle spasms. I have also been working out at [employer] and that seems to have pushed it a little."   Currently in Pain? Yes   Pain Score 6    Pain  Location Shoulder   Pain Orientation Left   Pain Descriptors / Indicators Spasm   Pain Type Chronic pain   Pain Onset More than a month ago   Multiple Pain Sites Yes   Pain Score 4   Pain Location Back   Pain Orientation Right   Pain Descriptors / Indicators Aching;Sore;Spasm;Tightness   Pain Type Chronic pain   Pain Onset More than a month ago   Pain Frequency Constant   Aggravating Factors  walking, lifting   Pain Relieving Factors ice/heat                         [facility] Adult PT Treatment/Exercise - 04/20/16 0001      Lumbar Exercises: Stretches   Single Knee to Chest Stretch 3 reps;30 seconds     Lumbar Exercises: Supine   Clam 15 reps  2 sets with red band   Dead Bug --  discontinued after 2 reps due to feeling of strain in back   Bridge 20 reps;5 seconds   Other Supine Lumbar Exercises marching 2 x 20     Lumbar Exercises: Quadruped   Madcat/Old Horse 10 reps   Straight Leg Raise 10 reps  5  sec hold; cueing to keep pelvis level   Straight Leg Raises Limitations alternating; bil     Knee/Hip Exercises: Stretches   Active Hamstring Stretch 3 reps;30 seconds  contract/ relax with 10 s hold   Piriformis Stretch 3 reps;30 seconds     Knee/Hip Exercises: Aerobic   Recumbent Bike L3 x 8 minutes     Manual Therapy   Manual therapy comments manual trigger point release over L upper trap   Muscle Energy Technique scissoring technque with R hip flexion and left hipextension 10 x 10 sec hold with dowel                  PT Short Term Goals - 03/30/16 1233      PT SHORT TERM GOAL #1   Title pt will be I with inital HEP (03/23/2015)   Time 3   Period Weeks   Status On-going     PT SHORT TERM GOAL #2   Title pt will be able to verbalize and demo techniques to reduce pain and inflammation via RICE (03/23/2015)   Time 3   Period Weeks   Status On-going     PT SHORT TERM GOAL #3   Title pt will demonstrate improvement in L grip strength by >/=  10# to demonstrate improvement in shoulder function (03/23/2015)   Time 3   Period Weeks   Status On-going     PT SHORT TERM GOAL #4   Title She will increase shoulder flexion/ abduction by >/= 110 degrees to promote shoulder mobility with </= 4/10 pain (03/23/2015)   Time 3   Period Weeks   Status On-going     PT SHORT TERM GOAL #5   Title she will demonstrate reduced lumbar spine paraspinal tightness and promote decreased pain to </= 4/10 and promote trunk mobility    Time 3   Period Weeks   Status New           PT Long Term Goals - 03/30/16 1235      PT LONG TERM GOAL #1   Title pt will be I with all HEP given as of last visit (04/26/2016)   Time 6   Period Weeks   Status On-going     PT LONG TERM GOAL #2   Title pt will increase Shoulder fleixon to >/= 140 degrees and external rotation to >/= 85 degrees with </= 2/10 ( 04/26/2016)   Time 6   Period Weeks   Status On-going     PT LONG TERM GOAL #3   Title pt will increase L shoulder strength to >/= 4/5 with </2/10 to promote lifting and carrying activities (04/26/2016)   Time 6   Period Weeks   Status On-going     PT LONG TERM GOAL #4   Title pt will be able to lift and lower >/= 8# to and from a overhead shelf and pushing/ pulling activities for functional mobility with </= 2/10 pain 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    Time 6   Period Weeks               Plan - 04/20/16 1227    Clinical Impression  Statement pt reported increase in pain since the last session in both her L shoulder and R low back after possibly overdoing it at the gym earlier in the week. Focused on stretching and lower extremity strengthening. Pt reported increase in back pain during  certain movements and only mild relief after exercise. She declined modalities post session.   PT Next Visit Plan shoulder mobililty, scapular stabilizers/ rotator cuffs strengthening, modalities PRN, hamstring stretching, hip flexor strengthening. manual for low back.    PT Home Exercise Plan ball squeezes, scapular retraction, table slides for flexion/ abuction, LTR, hamstring stretching, SLR, cat/ camel, dead bug   Consulted and Agree with Plan of Care Patient      Patient will benefit from skilled therapeutic intervention in order to improve the following deficits and impairments:  Abnormal gait, Pain, Decreased range of motion, Hypomobility, Decreased strength, Decreased endurance, Decreased activity tolerance, Improper body mechanics, Postural dysfunction, Impaired flexibility, Increased fascial restricitons  Visit Diagnosis: Chronic left shoulder pain  Stiffness of left shoulder, not elsewhere classified  Muscle weakness (generalized)  Abnormal posture  Chronic bilateral low back pain, with sciatica presence unspecified  Muscle spasm of back     Problem List Patient Active Problem List   Diagnosis Date Noted  . Spinal stenosis of lumbar region 11/29/2015  . Positive urine drug screen 11/16/2015  . Chronic pain syndrome 10/12/2015  . Umbilical hernia 16/12/9602  . Sleep difficulties 07/15/2015  . Bunion, right 06/20/2015  . Callus of foot 04/27/2015  . Muscle spasm 04/16/2015  . B12 deficiency 03/30/2015  . Hyperglycemia 03/30/2015  . Hammer toe of left foot 02/16/2015  . Fibrosis of skin of lower extremity 02/16/2015  . Status post right foot surgery 10/26/2014  . Cervical spondylosis with radiculopathy 03/01/2014  . Hammer toe of right foot 01/26/2014  . Benign intracranial hypertension 06/18/2013  . Pain in lower limb 05/22/2013  . Abnormal gait 04/21/2013  . Arthritis 04/21/2013  . Disturbance of skin sensation 04/21/2013  . Cephalalgia 04/21/2013  .  Porokeratosis 03/31/2013  . Pain, foot 03/31/2013  . Vitamin D deficiency 04/18/2010  . Fatigue 04/14/2010  . Back pain with right-sided radiculopathy 11/29/2008  . MEDIAL MENISCUS TEAR, LEFT 06/30/2008  . BARRETTS ESOPHAGUS 06/28/2008  . OSTEOARTHRITIS, KNEES, BILATERAL 06/28/2008  . OBESITY 05/24/2008  . HYPERTENSION, BENIGN ESSENTIAL 05/24/2008  . IRRITABLE BOWEL SYNDROME 05/24/2008  . BARIATRIC SURGERY STATUS 03/19/2006  . NEOPLASM, MALIGNANT, COLON, HX OF 03/19/1997    Alda Ponder, SPT 04/20/2016, 12:33 PM  [facility] [address] [place], [place], [address] Phone: [phone]   Fax:  [phone]  Name: KRYSTALLE PILKINGTON MRN: [id number] Date of Birth: 04/18/1960     PHYSICAL THERAPY DISCHARGE SUMMARY  Visits from Start of Care: 7  Current functional level related to goals / functional outcomes: See goals   Remaining deficits: unkown   Education / Equipment: HEP,posture, biomechanics, anatomy of condition involved  Plan: Patient agrees to discharge.  Patient goals were partially met. Patient is being discharged due to not returning since the last visit.  ?????      Kristoffer Leamon PT, DPT, LAT, ATC  05/22/16  1:30 PM

## 2016-04-25 ENCOUNTER — Ambulatory Visit: Payer: Medicare Other | Admitting: Physical Therapy

## 2016-04-27 ENCOUNTER — Telehealth: Payer: Self-pay | Admitting: Physical Therapy

## 2016-04-27 ENCOUNTER — Ambulatory Visit: Payer: Medicare Other | Admitting: Physical Therapy

## 2016-04-27 ENCOUNTER — Ambulatory Visit: Payer: Medicare Other | Admitting: Podiatry

## 2016-04-27 NOTE — Telephone Encounter (Signed)
Spoke with pt regarding missing today's visit. She reported she was going up the stairs carrying a heavy object and fell again and is having spasm and pain and plans to see her MD today. Discussed with pt that when she is in pain it would be even more reason to come to therapy. Today was her last scheduled visit and if she wanted to get scheduled that due to policy and her having 2 missed visits that she can schedule only 1 visit at a time; which she reported understanding. She plans to call back and get scheduled after her MD appointment.

## 2016-04-28 ENCOUNTER — Ambulatory Visit (INDEPENDENT_AMBULATORY_CARE_PROVIDER_SITE_OTHER): Payer: Medicare Other | Admitting: Family Medicine

## 2016-04-28 ENCOUNTER — Encounter: Payer: Self-pay | Admitting: Family Medicine

## 2016-04-28 VITALS — BP 138/80 | HR 80 | Temp 97.8°F | Resp 20 | Ht 67.0 in | Wt 240.0 lb

## 2016-04-28 DIAGNOSIS — B373 Candidiasis of vulva and vagina: Secondary | ICD-10-CM

## 2016-04-28 DIAGNOSIS — B3731 Acute candidiasis of vulva and vagina: Secondary | ICD-10-CM

## 2016-04-28 MED ORDER — FLUCONAZOLE 150 MG PO TABS: 150.0000 mg | ORAL_TABLET | Freq: Once | ORAL | 0 refills | Status: AC

## 2016-04-28 NOTE — Progress Notes (Signed)
Pre visit review using our clinic review tool, if applicable. No additional management support is needed unless otherwise documented below in the visit note. 

## 2016-04-28 NOTE — Progress Notes (Signed)
Subjective:    Patient ID: Misty [name], female    DOB: May 23, 1960, 56 y.o.   MRN: [id number]  HPI  Misty [name] is a 56 year old female who presents today with vaginal itching  that started 3 days ago. She reports that itching is present on vulva. She denies fever, chills, sweats, vaginal discharge, dysuria, hematuria, urinary frequency/urgency, or flank pain. She reports one episode of one loose stool after eating at a restaurant 4 days prior to vaginal itching. History of yeast infection after Cipro approximately 4 months ago per patient report that was treated effectively with diflucan. Treatment at home with one monistat that has provided limited benefit.   No recent antibiotic use.  Recent steroid injections in hips per patient report. No history of diabetes. No change in sexual partners.  Review of Systems  Constitutional: Negative for chills and fever.  Respiratory: Negative for cough, shortness of breath and wheezing.   Gastrointestinal: Negative for abdominal pain, diarrhea, nausea and vomiting.  Genitourinary: Negative for dysuria, flank pain, frequency, genital sores, hematuria, pelvic pain and urgency.       Vaginal itching  Musculoskeletal: Negative for myalgias.  Neurological: Negative for dizziness and headaches.   Past Medical History:  Diagnosis Date  . Allergy   . Arthritis   . Back pain   . Colon cancer ([place])   . Depression   . Diarrhea   . GERD (gastroesophageal reflux disease)   . Hypertension   . Insomnia   . MS (multiple sclerosis) ([place])   . Other hammer toe (acquired) 03/31/2013  . Pneumonia    hx of  . Umbilical hernia      Social History   Social History  . Marital status: Single    Spouse name: N/A  . Number of children: N/A  . Years of education: N/A   Occupational History  . Not on file.   Social History Main Topics  . Smoking status: Current Some Day Smoker    Packs/day: 0.25    Years: 20.00    Types: Cigarettes  . Smokeless  tobacco: Never Used  . Alcohol use 0.0 oz/week     Comment: 1/2 pint daily  . Drug use: No  . Sexual activity: Yes    Partners: Male   Other Topics Concern  . Not on file   Social History Narrative  . No narrative on file    Past Surgical History:  Procedure Laterality Date  . ABDOMINAL HYSTERECTOMY    . ANTERIOR CERVICAL DECOMP/DISCECTOMY FUSION N/A 03/01/2014   Procedure: ANTERIOR CERVICAL DECOMPRESSION/DISCECTOMY FUSION 1 LEVEL;  Surgeon: Charlie Pitter, MD;  Location: [facility];  Service: Neurosurgery;  Laterality: N/A;  ANTERIOR CERVICAL DECOMPRESSION/DISCECTOMY FUSION 1 LEVEL  . BUNIONECTOMY Right 10/14/2014   '@[facility]'$   . CESAREAN SECTION    . COLON SURGERY    . COLONOSCOPY    . FOOT SURGERY Right   . GASTRIC BYPASS    . Hammer Toe Repair Right 10/14/2014   RT #2, '@[facility]'$   . TENOTOMY Right 10/14/2014   RT #3, '@[facility]'$     Family History  Problem Relation Age of Onset  . Stroke Mother   . Hypertension Mother   . Hyperlipidemia Mother   . Diabetes Mother   . Diabetes Brother   . Hypertension Brother   . Hypertension Brother   . Hypertension Brother   . Hypertension Brother   . Hypertension Brother   . Alcoholism Brother     Allergies  Allergen  Reactions  . Phenergan [Promethazine Hcl] Anxiety  . Promethazine Anxiety  . Trazodone And Nefazodone Rash    Current Outpatient Prescriptions on File Prior to Visit  Medication Sig Dispense Refill  . aspirin 81 MG tablet Take 81 mg by mouth daily.    . baclofen (LIORESAL) 10 MG tablet Take 10 mg by mouth 2 (two) times daily.    . Cholecalciferol (VITAMIN D) 2000 units CAPS Take 1 capsule (2,000 Units total) by mouth daily. 90 capsule 1  . Cyanocobalamin (B-12) 1000 MCG/ML KIT Inject 1,000 mcg as directed every 30 (thirty) days. 1 kit 6  . hydrochlorothiazide (MICROZIDE) 12.5 MG capsule Take 1 capsule (12.5 mg total) by mouth daily. 90 capsule 1  . lisinopril (PRINIVIL,ZESTRIL) 20 MG tablet Take 1 tablet (20 mg total) by mouth  daily. 90 tablet 3  . oxyCODONE-acetaminophen (PERCOCET) 7.5-325 MG tablet Take 1 tablet by mouth every 4 (four) hours as needed for severe pain.    . VENTOLIN HFA 108 (90 Base) MCG/ACT inhaler Inhale 1 puff into the lungs every 6 (six) hours as needed for wheezing or shortness of breath. 1 Inhaler 2  . vitamin B-6 (PYRIDOXINE) 25 MG tablet Take 25 mg by mouth daily.     Current Facility-Administered Medications on File Prior to Visit  Medication Dose Route Frequency Provider Last Rate Last Dose  . cyanocobalamin ((VITAMIN B-12)) injection 1,000 mcg  1,000 mcg Intramuscular Weekly [name], MD   1,000 mcg at 06/03/15 1435    BP 138/80 (BP Location: Left Arm, Patient Position: Sitting, Cuff Size: Large)   Pulse 80   Temp 97.8 F (36.6 C) (Oral)   Resp 20   Ht 5' 7"  (1.702 m)   Wt 240 lb (108.9 kg)   SpO2 99%   BMI 37.59 kg/m        Objective:   Physical Exam  Constitutional: She is oriented to person, place, and time. She appears well-developed and well-nourished.  Eyes: No scleral icterus.  Neck: Neck supple.  Cardiovascular: Normal rate and regular rhythm.   Pulmonary/Chest: Effort normal and breath sounds normal. She has no wheezes. She has no rales.  Abdominal: Soft. Bowel sounds are normal. There is no tenderness.  Musculoskeletal: She exhibits no edema.  Lymphadenopathy:    She has no cervical adenopathy.  Neurological: She is alert and oriented to person, place, and time.  Skin: Skin is warm and dry. No rash noted.        Assessment & Plan:   1. Yeast infection involving the vagina and surrounding area Suspect yeast due to symptoms. Empiric treatment for candidiasis provided; Advised follow up if symptoms do not improve in 2 to 3 days with treatment. Further advised her to follow up as recommended with her PCP as she reports missing her last appointment.  Delano Metz, FNP-C

## 2016-04-28 NOTE — Patient Instructions (Addendum)
Follow up with your provider if symptoms do not improve in 2 to 3 days with treatment.   Vaginal Yeast infection, Adult Vaginal yeast infection is a condition that causes soreness, swelling, and redness (inflammation) of the vagina. It also causes vaginal discharge. This is a common condition. Some women get this infection frequently. What are the causes? This condition is caused by a change in the normal balance of the yeast (candida) and bacteria that live in the vagina. This change causes an overgrowth of yeast, which causes the inflammation. What increases the risk? This condition is more likely to develop in:  Women who take antibiotic medicines.  Women who have diabetes.  Women who take birth control pills.  Women who are pregnant.  Women who douche often.  Women who have a weak defense (immune) system.  Women who have been taking steroid medicines for a long time.  Women who frequently wear tight clothing. What are the signs or symptoms? Symptoms of this condition include:  White, thick vaginal discharge.  Swelling, itching, redness, and irritation of the vagina. The lips of the vagina (vulva) may be affected as well.  Pain or a burning feeling while urinating.  Pain during sex. How is this diagnosed? This condition is diagnosed with a medical history and physical exam. This will include a pelvic exam. Your health care provider will examine a sample of your vaginal discharge under a microscope. Your health care provider may send this sample for testing to confirm the diagnosis. How is this treated? This condition is treated with medicine. Medicines may be over-the-counter or prescription. You may be told to use one or more of the following:  Medicine that is taken orally.  Medicine that is applied as a cream.  Medicine that is inserted directly into the vagina (suppository). Follow these instructions at home:  Take or apply over-the-counter and prescription  medicines only as told by your health care provider.  Do not have sex until your health care provider has approved. Tell your sex partner that you have a yeast infection. That person should go to his or her health care provider if he or she develops symptoms.  Do not wear tight clothes, such as pantyhose or tight pants.  Avoid using tampons until your health care provider approves.  Eat more yogurt. This may help to keep your yeast infection from returning.  Try taking a sitz bath to help with discomfort. This is a warm water bath that is taken while you are sitting down. The water should only come up to your hips and should cover your buttocks. Do this 3-4 times per day or as told by your health care provider.  Do not douche.  Wear breathable, cotton underwear.  If you have diabetes, keep your blood sugar levels under control. Contact a health care provider if:  You have a fever.  Your symptoms go away and then return.  Your symptoms do not get better with treatment.  Your symptoms get worse.  You have new symptoms.  You develop blisters in or around your vagina.  You have blood coming from your vagina and it is not your menstrual period.  You develop pain in your abdomen. This information is not intended to replace advice given to you by your health care provider. Make sure you discuss any questions you have with your health care provider. Document Released: 12/13/2004 Document Revised: 08/17/2015 Document Reviewed: 09/06/2014 Elsevier Interactive Patient Education  2017 [employer].

## 2016-05-02 ENCOUNTER — Ambulatory Visit: Payer: Medicare Other | Admitting: Physical Therapy

## 2016-05-02 ENCOUNTER — Telehealth: Payer: Self-pay | Admitting: Physical Therapy

## 2016-05-02 ENCOUNTER — Telehealth: Payer: Self-pay | Admitting: Internal Medicine

## 2016-05-02 MED ORDER — FLUCONAZOLE 150 MG PO TABS: 150.0000 mg | ORAL_TABLET | Freq: Once | ORAL | 0 refills | Status: AC

## 2016-05-02 NOTE — Telephone Encounter (Signed)
Pt reported she had the flu and didn't want to get anyone else sick, and will schedule when she is feeling better.

## 2016-05-02 NOTE — Telephone Encounter (Signed)
Spoke with pt to inform.  

## 2016-05-02 NOTE — Telephone Encounter (Signed)
Pt called and said that she was seen on sat for yeast infection.  She said that the provider gave her just 1 pill and she normally gets a double dose.  The one is not helping.  She wants to know if she can call in another one?

## 2016-05-02 NOTE — Telephone Encounter (Signed)
Please advise 

## 2016-05-02 NOTE — Telephone Encounter (Signed)
Yes, sent to pharmacy

## 2016-05-03 ENCOUNTER — Ambulatory Visit (INDEPENDENT_AMBULATORY_CARE_PROVIDER_SITE_OTHER): Payer: Medicare Other | Admitting: Podiatry

## 2016-05-03 DIAGNOSIS — M79674 Pain in right toe(s): Secondary | ICD-10-CM | POA: Diagnosis not present

## 2016-05-03 DIAGNOSIS — M79675 Pain in left toe(s): Secondary | ICD-10-CM | POA: Diagnosis not present

## 2016-05-03 DIAGNOSIS — R52 Pain, unspecified: Secondary | ICD-10-CM | POA: Diagnosis not present

## 2016-05-03 DIAGNOSIS — B351 Tinea unguium: Secondary | ICD-10-CM | POA: Diagnosis not present

## 2016-05-03 DIAGNOSIS — Q828 Other specified congenital malformations of skin: Secondary | ICD-10-CM | POA: Diagnosis not present

## 2016-05-04 ENCOUNTER — Ambulatory Visit: Payer: Medicare Other | Admitting: Physical Therapy

## 2016-05-04 ENCOUNTER — Ambulatory Visit (INDEPENDENT_AMBULATORY_CARE_PROVIDER_SITE_OTHER): Payer: Medicare Other

## 2016-05-04 DIAGNOSIS — E538 Deficiency of other specified B group vitamins: Secondary | ICD-10-CM

## 2016-05-04 MED ORDER — CYANOCOBALAMIN 1000 MCG/ML IJ SOLN
1000.0000 ug | Freq: Once | INTRAMUSCULAR | Status: AC
  Administered 2016-05-04: 1000 ug via INTRAMUSCULAR

## 2016-05-04 NOTE — Progress Notes (Signed)
Patient ID: [name], female   DOB: 1960-09-20, 56 y.o.   MRN: [id number]  Subjective: 56 year old female presents the office today for concerns of painful calluses to both of her feet as well as elongated toenails that cause pressure in shoes.  Denies any redness or drainage or any swelling. Denies any systemic complaints such as fevers, chills, nausea, vomiting. No acute changes since last appointment, and no other complaints at this time.   Objective: AAO x3, NAD DP/PT pulses palpable bilaterally, CRT less than 3 seconds Nails are hypertrophic, dystrophic, discolored, elonagated x 10. Tenderness to nails 1-5 bilaterally. No drainage, swelling, or redness.  Hyperkeratotic lesion present submetatarsal 3 bilateral foot, medial first metatarsal head, bilateral 5th digits. Also on the left first metatarsal for and fifth digit. Upon debridement no underlying ulceration, drainage or other signs of infection. No other lesions pre-ulcer lesions. Prominence of the metatarsal heads plantar and there is adductovarus of the 5th digits.  No pain with calf compression, swelling, warmth, erythema   Assessment: Symptomatic hyperkeratotic lesions, symptomatic onychomycosis   Plan: -All treatment options discussed with the patient including all alternatives, risks, complications.  -Hyperkeratotic lesions were debrided 6 without complications or bleeding. -Nails debrided x 10 without complications or bleedings.  -RTC 3 months in 3 months or sooner if needed -Patient encouraged to call the office with any questions, concerns, change in symptoms.   Celesta Gentile, DPM

## 2016-05-14 ENCOUNTER — Other Ambulatory Visit: Payer: Self-pay | Admitting: Family Medicine

## 2016-05-14 ENCOUNTER — Telehealth: Payer: Self-pay | Admitting: *Deleted

## 2016-05-14 NOTE — Telephone Encounter (Signed)
duplicate see precious msg...Misty [name]

## 2016-05-21 ENCOUNTER — Ambulatory Visit (INDEPENDENT_AMBULATORY_CARE_PROVIDER_SITE_OTHER): Payer: Medicare Other | Admitting: Nurse Practitioner

## 2016-05-21 ENCOUNTER — Encounter: Payer: Self-pay | Admitting: Nurse Practitioner

## 2016-05-21 VITALS — BP 158/94 | HR 78 | Temp 97.6°F | Ht 66.0 in | Wt 242.0 lb

## 2016-05-21 DIAGNOSIS — N898 Other specified noninflammatory disorders of vagina: Secondary | ICD-10-CM

## 2016-05-21 DIAGNOSIS — L298 Other pruritus: Secondary | ICD-10-CM

## 2016-05-21 DIAGNOSIS — R300 Dysuria: Secondary | ICD-10-CM | POA: Diagnosis not present

## 2016-05-21 LAB — POCT URINALYSIS DIPSTICK
Bilirubin, UA: NEGATIVE
Glucose, UA: NEGATIVE
Ketones, UA: NEGATIVE
Leukocytes, UA: NEGATIVE
Nitrite, UA: NEGATIVE
Spec Grav, UA: >=1.03
Urobilinogen, UA: 0.2
pH, UA: 5.5

## 2016-05-21 MED ORDER — CLOTRIMAZOLE 2 % VA CREA: 1.0000 | TOPICAL_CREAM | Freq: Every day | VAGINAL | 0 refills | Status: DC

## 2016-05-21 NOTE — Patient Instructions (Signed)
Vaginitis Vaginitis is an inflammation of the vagina. It is most often caused by a change in the normal balance of the bacteria and yeast that live in the vagina. This change in balance causes an overgrowth of certain bacteria or yeast, which causes the inflammation. There are different types of vaginitis, but the most common types are:  Bacterial vaginosis.  Yeast infection (candidiasis).  Trichomoniasis vaginitis. This is a sexually transmitted infection (STI).  Viral vaginitis.  Atrophic vaginitis.  Allergic vaginitis. What are the causes? The cause depends on the type of vaginitis. Vaginitis can be caused by:  Bacteria (bacterial vaginosis).  Yeast (yeast infection).  A parasite (trichomoniasis vaginitis)  A virus (viral vaginitis).  Low hormone levels (atrophic vaginitis). Low hormone levels can occur during pregnancy, breastfeeding, or after menopause.  Irritants, such as bubble baths, scented tampons, and feminine sprays (allergic vaginitis). Other factors can change the normal balance of the yeast and bacteria that live in the vagina. These include:  Antibiotic medicines.  Poor hygiene.  Diaphragms, vaginal sponges, spermicides, birth control pills, and intrauterine devices (IUD).  Sexual intercourse.  Infection.  Uncontrolled diabetes.  A weakened immune system. What are the signs or symptoms? Symptoms can vary depending on the cause of the vaginitis. Common symptoms include:  Abnormal vaginal discharge.  The discharge is white, gray, or yellow with bacterial vaginosis.  The discharge is thick, white, and cheesy with a yeast infection.  The discharge is frothy and yellow or greenish with trichomoniasis.  A bad vaginal odor.  The odor is fishy with bacterial vaginosis.  Vaginal itching, pain, or swelling.  Painful intercourse.  Pain or burning when urinating. Sometimes there are no symptoms. How is this treated? Treatment will vary depending on  the type of infection.  Bacterial vaginosis and trichomoniasis are often treated with antibiotic creams or pills.  Yeast infections are often treated with antifungal medicines, such as vaginal creams or suppositories.  Viral vaginitis has no cure, but symptoms can be treated with medicines that relieve discomfort. Your sexual partner should be treated as well.  Atrophic vaginitis may be treated with an estrogen cream, pill, suppository, or vaginal ring. If vaginal dryness occurs, lubricants and moisturizing creams may help. You may be told to avoid scented soaps, sprays, or douches.  Allergic vaginitis treatment involves quitting the use of the product that is causing the problem. Vaginal creams can be used to treat the symptoms. Follow these instructions at home:  Take all medicines as directed by your caregiver.  Keep your genital area clean and dry. Avoid soap and only rinse the area with water.  Avoid douching. It can remove the healthy bacteria in the vagina.  Do not use tampons or have sexual intercourse until your vaginitis has been treated. Use sanitary pads while you have vaginitis.  Wipe from front to back. This avoids the spread of bacteria from the rectum to the vagina.  Let air reach your genital area. ? Wear cotton underwear to decrease moisture buildup.  Avoid wearing underwear while you sleep until your vaginitis is gone.  Avoid tight pants and underwear or nylons without a cotton panel.  Take off wet clothing (especially bathing suits) as soon as possible.  Use mild, non-scented products. Avoid using irritants, such as:  Scented feminine sprays.  Fabric softeners.  Scented detergents.  Scented tampons.  Scented soaps or bubble baths.  Practice safe sex and use condoms. Condoms may prevent the spread of trichomoniasis and viral vaginitis. Contact a health care   provider if:  You have abdominal pain.  You have symptoms that last for more than 2-3  days.  You have a fever and your symptoms suddenly get worse. This information is not intended to replace advice given to you by your health care provider. Make sure you discuss any questions you have with your health care provider. Document Released: 12/31/2006 Document Revised: 01/25/2016 Document Reviewed: 01/25/2016 Elsevier Interactive Patient Education  2017 [employer] Inc[employer]  

## 2016-05-21 NOTE — Progress Notes (Signed)
Subjective:  Patient ID: Misty [name], female    DOB: [birth year]  Age: 56 y.o. MRN: [id number]  CC: Vaginitis (pt stated itching for about 1 week.)   Dysuria   This is a new problem. The current episode started yesterday. The problem occurs intermittently. The problem has been unchanged. The quality of the pain is described as burning. There has been no fever. She is sexually active. Pertinent negatives include no chills, discharge, flank pain, frequency, hematuria, hesitancy, nausea, possible pregnancy, sweats, urgency or vomiting. Associated symptoms comments: No vaginal itching or odor.. Treatments tried: diflucan. The treatment provided moderate relief. There is no history of catheterization, recurrent UTIs, a single kidney, urinary stasis or a urological procedure.    Outpatient Medications Prior to Visit  Medication Sig Dispense Refill  . aspirin 81 MG tablet Take 81 mg by mouth daily.    . baclofen (LIORESAL) 10 MG tablet Take 10 mg by mouth 2 (two) times daily.    . Cholecalciferol (VITAMIN D) 2000 units CAPS Take 1 capsule (2,000 Units total) by mouth daily. 90 capsule 1  . cyanocobalamin (,VITAMIN B-12,) 1000 MCG/ML injection INJECT 1,000 MCG AS DIRECTED EVERY 30 DAYS. 30 mL 1  . hydrochlorothiazide (MICROZIDE) 12.5 MG capsule Take 1 capsule (12.5 mg total) by mouth daily. 90 capsule 1  . lisinopril (PRINIVIL,ZESTRIL) 20 MG tablet Take 1 tablet (20 mg total) by mouth daily. 90 tablet 3  . oxyCODONE-acetaminophen (PERCOCET) 7.5-325 MG tablet Take 1 tablet by mouth every 4 (four) hours as needed for severe pain.    . VENTOLIN HFA 108 (90 Base) MCG/ACT inhaler Inhale 1 puff into the lungs every 6 (six) hours as needed for wheezing or shortness of breath. 1 Inhaler 2  . vitamin B-6 (PYRIDOXINE) 25 MG tablet Take 25 mg by mouth daily.     Facility-Administered Medications Prior to Visit  Medication Dose Route Frequency Provider Last Rate Last Dose  . cyanocobalamin ((VITAMIN  B-12)) injection 1,000 mcg  1,000 mcg Intramuscular Weekly [name], MD   1,000 mcg at 06/03/15 1435    ROS See HPI  Objective:  BP (!) 158/94   Pulse 78   Temp 97.6 F (36.4 C)   Ht 5\' 6"  (1.676 m)   Wt 242 lb (109.8 kg)   SpO2 100%   BMI 39.06 kg/m   BP Readings from Last 3 Encounters:  05/21/16 (!) 158/94  04/28/16 138/80  01/18/16 (!) 158/82    Wt Readings from Last 3 Encounters:  05/21/16 242 lb (109.8 kg)  04/28/16 240 lb (108.9 kg)  01/18/16 246 lb (111.6 kg)    Physical Exam  Constitutional: No distress.  Cardiovascular: Normal rate.   Pulmonary/Chest: Effort normal.  Abdominal: Soft. She exhibits no distension.  Genitourinary: Vagina normal. No vaginal discharge found.  Skin: Skin is warm and dry. No rash noted. No erythema.  Vitals reviewed.   Lab Results  Component Value Date   WBC 5.4 10/05/2015   HGB 12.0 10/05/2015   HCT 36.5 10/05/2015   PLT 190.0 10/05/2015   GLUCOSE 90 01/18/2016   CHOL 278 (H) 03/17/2013   TRIG 106 03/17/2013   HDL 121 03/17/2013   LDLCALC 136 (H) 03/17/2013   ALT 15 01/18/2016   AST 19 01/18/2016   NA 142 01/18/2016   K 4.2 01/18/2016   CL 105 01/18/2016   CREATININE 0.64 01/18/2016   BUN 12 01/18/2016   CO2 29 01/18/2016   TSH 0.82 10/05/2015   HGBA1C 5.1  01/18/2016    Mr Pelvis W Wo Contrast  Result Date: 12/31/2015 CLINICAL DATA:  Right flank pain radiating into or posterior buttock. History of colon cancer. EXAM: MRI PELVIS WITHOUT AND WITH CONTRAST TECHNIQUE: Multiplanar multisequence MR imaging of the pelvis was performed both before and after administration of intravenous contrast. CONTRAST:  34mL MULTIHANCE GADOBENATE DIMEGLUMINE 529 MG/ML IV SOLN COMPARISON:  None. FINDINGS: Bone No hip fracture, dislocation or avascular necrosis. Ankylosis of the left sacroiliac joint. Mild osteoarthritis of the right sacroiliac joint. No bone destruction or periosteal reaction. Mild heterogeneous marrow signal  throughout the pelvis. 1.7 x 1.2 x 1.1 cm T2 hyperintense, T1 intermediate signal enhancing lesion in the S3 vertebral body. The lesion demonstrates a stippled appearance best seen on image 13/series 10. No other suspicious bone lesions are identified. Subchondral reactive marrow changes in the right superior acetabulum and femoral head. Subchondral reactive marrow changes in the left superior acetabulum. Alignment Normal. No subluxation. Dysplasia None. Joint effusion None. Labrum Grossly intact, but evaluation is limited by lack of intraarticular fluid. Cartilage High-grade partial-thickness cartilage loss of the right femoral head and acetabulum. Partial-thickness cartilage loss of the left femoral head and acetabulum. Right hip marginal osteophytosis. Capsule and ligaments Normal. Muscles and Tendons Flexors: Normal. Extensors: Normal. Abductors: Normal. Adductors: Normal. Rotators: Normal. Hamstrings: Mild tendinosis at the left hamstring origin. Other Findings None Viscera Normal. No abnormality seen in pelvis. No lymphadenopathy. No free fluid in the pelvis. IMPRESSION: 1. 1.7 x 1.2 x 1.1 cm T2 hyperintense, T1 intermediate signal enhancing lesion in the S3 vertebral body. The lesion demonstrates a stippled appearance best seen on image 13/series 10. This likely reflects an atypical hemangioma, but metastatic disease cannot be completely excluded although considered less likely. Follow-up MRI of the pelvis in 3-6 months is recommended. 2. Moderate osteoarthritis of the right hip. 3. Mild osteoarthritis of the left hip. 4. Ankylosis of the left sacroiliac joint. 5. Mild osteoarthritis of the right sacroiliac joint. 6. Mild tendinosis of the left hamstring origin. Electronically Signed   By: Kathreen Devoid   On: 12/31/2015 07:59    Assessment & Plan:   Misty [name] was seen today for vaginitis.  Diagnoses and all orders for this visit:  Itching in the vaginal area -     POCT urinalysis dipstick -      clotrimazole (CLOTRIMAZOLE 3) 2 % vaginal cream; Place 1 Applicatorful vaginally at bedtime.  Dysuria -     POCT urinalysis dipstick -     clotrimazole (CLOTRIMAZOLE 3) 2 % vaginal cream; Place 1 Applicatorful vaginally at bedtime.   I am having [name] start on clotrimazole. I am also having her maintain her aspirin, lisinopril, VENTOLIN HFA, oxyCODONE-acetaminophen, baclofen, Vitamin D, vitamin B-6, hydrochlorothiazide, and cyanocobalamin. We will continue to administer cyanocobalamin.  Meds ordered this encounter  Medications  . clotrimazole (CLOTRIMAZOLE 3) 2 % vaginal cream    Sig: Place 1 Applicatorful vaginally at bedtime.    Dispense:  21 g    Refill:  0    Order Specific Question:   Supervising Provider    Answer:   Cassandria Anger [[id number]]    Follow-up: Return if symptoms worsen or fail to improve.  Wilfred Lacy, NP

## 2016-05-22 ENCOUNTER — Telehealth: Payer: Self-pay | Admitting: Nurse Practitioner

## 2016-05-22 DIAGNOSIS — N898 Other specified noninflammatory disorders of vagina: Secondary | ICD-10-CM

## 2016-05-22 MED ORDER — TINIDAZOLE 500 MG PO TABS: 1.0000 g | ORAL_TABLET | Freq: Every day | ORAL | 0 refills | Status: DC

## 2016-05-22 NOTE — Telephone Encounter (Signed)
Patient states her condition is getting worse.  She does not want anything that is over the counter b/c she wants her medicaid to cover.  States the medication [facility] sent in the pharmacy states is OTC.  Patient also states the instructions on this medication does not make up with the instructions Baldo Ash gave her while in the office.  Patient is requesting call [phone].

## 2016-05-22 NOTE — Telephone Encounter (Signed)
Pt aware.

## 2016-05-22 NOTE — Addendum Note (Signed)
Addended byShawnie Pons on: 05/22/2016 06:05 PM   Modules accepted: Orders

## 2016-05-22 NOTE — Telephone Encounter (Signed)
Pt saw Misty [name] yesterday. Baldo Ash told the pt that she would send in a prescription that started with a "T". She was not sure what the name of it was. Clotrimazole (CLOTRIMAZOLE 3) 2 % vaginal cream was sent over but the pt said that this was an over the counter medication that Baldo Ash did not want her to do. Please advise. Thanks  [employer]

## 2016-05-22 NOTE — Telephone Encounter (Signed)
Pt aware new rx sent in. 

## 2016-05-24 ENCOUNTER — Ambulatory Visit: Payer: Medicare Other | Admitting: Physical Therapy

## 2016-06-05 ENCOUNTER — Ambulatory Visit: Payer: Medicare Other | Admitting: Internal Medicine

## 2016-06-05 DIAGNOSIS — Z0289 Encounter for other administrative examinations: Secondary | ICD-10-CM

## 2016-06-05 NOTE — Progress Notes (Deleted)
Subjective:    Patient ID: Misty [name], female    DOB: [birth year], 56 y.o.   MRN: [id number]  HPI The patient is here for follow up.  Hypertension: She is taking her medication daily. She is compliant with a low sodium diet.  She denies chest pain, palpitations, edema, shortness of breath and regular headaches. She is exercising regularly.  She does not monitor her blood pressure at home.    Chronic back pain:  She is seeing Dr Lynann Bologna.  She is taking percocet daily, which is prescribed by Dr Lynann Bologna.    Medications and allergies reviewed with patient and updated if appropriate.  Patient Active Problem List   Diagnosis Date Noted  . Spinal stenosis of lumbar region 11/29/2015  . Positive urine drug screen 11/16/2015  . Chronic pain syndrome 10/12/2015  . Umbilical hernia 26/37/8588  . Sleep difficulties 07/15/2015  . Bunion, right 06/20/2015  . Callus of foot 04/27/2015  . Muscle spasm 04/16/2015  . B12 deficiency 03/30/2015  . Hyperglycemia 03/30/2015  . Hammer toe of left foot 02/16/2015  . Fibrosis of skin of lower extremity 02/16/2015  . Status post right foot surgery 10/26/2014  . Cervical spondylosis with radiculopathy 03/01/2014  . Hammer toe of right foot 01/26/2014  . Benign intracranial hypertension 06/18/2013  . Pain in lower limb 05/22/2013  . Abnormal gait 04/21/2013  . Arthritis 04/21/2013  . Disturbance of skin sensation 04/21/2013  . Cephalalgia 04/21/2013  . Porokeratosis 03/31/2013  . Pain, foot 03/31/2013  . Vitamin D deficiency 04/18/2010  . Fatigue 04/14/2010  . Back pain with right-sided radiculopathy 11/29/2008  . MEDIAL MENISCUS TEAR, LEFT 06/30/2008  . BARRETTS ESOPHAGUS 06/28/2008  . OSTEOARTHRITIS, KNEES, BILATERAL 06/28/2008  . OBESITY 05/24/2008  . HYPERTENSION, BENIGN ESSENTIAL 05/24/2008  . IRRITABLE BOWEL SYNDROME 05/24/2008  . BARIATRIC SURGERY STATUS 03/19/2006  . NEOPLASM, MALIGNANT, COLON, HX OF 03/19/1997    Current  Outpatient Prescriptions on File Prior to Visit  Medication Sig Dispense Refill  . aspirin 81 MG tablet Take 81 mg by mouth daily.    . baclofen (LIORESAL) 10 MG tablet Take 10 mg by mouth 2 (two) times daily.    . Cholecalciferol (VITAMIN D) 2000 units CAPS Take 1 capsule (2,000 Units total) by mouth daily. 90 capsule 1  . cyanocobalamin (,VITAMIN B-12,) 1000 MCG/ML injection INJECT 1,000 MCG AS DIRECTED EVERY 30 DAYS. 30 mL 1  . hydrochlorothiazide (MICROZIDE) 12.5 MG capsule Take 1 capsule (12.5 mg total) by mouth daily. 90 capsule 1  . lisinopril (PRINIVIL,ZESTRIL) 20 MG tablet Take 1 tablet (20 mg total) by mouth daily. 90 tablet 3  . oxyCODONE-acetaminophen (PERCOCET) 7.5-325 MG tablet Take 1 tablet by mouth every 4 (four) hours as needed for severe pain.    Marland Kitchen tinidazole (TINDAMAX) 500 MG tablet Take 2 tablets (1,000 mg total) by mouth daily with breakfast. 10 tablet 0  . VENTOLIN HFA 108 (90 Base) MCG/ACT inhaler Inhale 1 puff into the lungs every 6 (six) hours as needed for wheezing or shortness of breath. 1 Inhaler 2  . vitamin B-6 (PYRIDOXINE) 25 MG tablet Take 25 mg by mouth daily.     Current Facility-Administered Medications on File Prior to Visit  Medication Dose Route Frequency Provider Last Rate Last Dose  . cyanocobalamin ((VITAMIN B-12)) injection 1,000 mcg  1,000 mcg Intramuscular Weekly [name], MD   1,000 mcg at 06/03/15 1435    Past Medical History:  Diagnosis Date  . Allergy   .  Arthritis   . Back pain   . Colon cancer ([place])   . Depression   . Diarrhea   . GERD (gastroesophageal reflux disease)   . Hypertension   . Insomnia   . MS (multiple sclerosis) ([place])   . Other hammer toe (acquired) 03/31/2013  . Pneumonia    hx of  . Umbilical hernia     Past Surgical History:  Procedure Laterality Date  . ABDOMINAL HYSTERECTOMY    . ANTERIOR CERVICAL DECOMP/DISCECTOMY FUSION N/A 03/01/2014   Procedure: ANTERIOR CERVICAL DECOMPRESSION/DISCECTOMY FUSION 1  LEVEL;  Surgeon: Charlie Pitter, MD;  Location: [facility];  Service: Neurosurgery;  Laterality: N/A;  ANTERIOR CERVICAL DECOMPRESSION/DISCECTOMY FUSION 1 LEVEL  . BUNIONECTOMY Right 10/14/2014   @[facility]   . CESAREAN SECTION    . COLON SURGERY    . COLONOSCOPY    . FOOT SURGERY Right   . GASTRIC BYPASS    . Hammer Toe Repair Right 10/14/2014   RT #2, @[facility]   . TENOTOMY Right 10/14/2014   RT #3, @[facility]     Social History   Social History  . Marital status: Single    Spouse name: N/A  . Number of children: N/A  . Years of education: N/A   Social History Main Topics  . Smoking status: Current Some Day Smoker    Packs/day: 0.25    Years: 20.00    Types: Cigarettes  . Smokeless tobacco: Never Used  . Alcohol use 0.0 oz/week     Comment: 1/2 pint daily  . Drug use: No  . Sexual activity: Yes    Partners: Male   Other Topics Concern  . Not on file   Social History Narrative  . No narrative on file    Family History  Problem Relation Age of Onset  . Stroke Mother   . Hypertension Mother   . Hyperlipidemia Mother   . Diabetes Mother   . Diabetes Brother   . Hypertension Brother   . Hypertension Brother   . Hypertension Brother   . Hypertension Brother   . Hypertension Brother   . Alcoholism Brother     Review of Systems     Objective:  There were no vitals filed for this visit. Wt Readings from Last 3 Encounters:  05/21/16 242 lb (109.8 kg)  04/28/16 240 lb (108.9 kg)  01/18/16 246 lb (111.6 kg)   There is no height or weight on file to calculate BMI.   Physical Exam    Constitutional: Appears well-developed and well-nourished. No distress.  HENT:  Head: Normocephalic and atraumatic.  Neck: Neck supple. No tracheal deviation present. No thyromegaly present.  No cervical lymphadenopathy Cardiovascular: Normal rate, regular rhythm and normal heart sounds.   No murmur heard. No carotid bruit .  No edema Pulmonary/Chest: Effort normal and breath sounds normal. No  respiratory distress. No has no wheezes. No rales.  Skin: Skin is warm and dry. Not diaphoretic.  Psychiatric: Normal mood and affect. Behavior is normal.      Assessment & Plan:    See Problem List for Assessment and Plan of chronic medical problems.

## 2016-06-06 ENCOUNTER — Encounter: Payer: Self-pay | Admitting: Physical Medicine & Rehabilitation

## 2016-06-12 ENCOUNTER — Encounter: Payer: Self-pay | Admitting: Emergency Medicine

## 2016-06-22 ENCOUNTER — Ambulatory Visit (HOSPITAL_BASED_OUTPATIENT_CLINIC_OR_DEPARTMENT_OTHER): Payer: Medicare Other | Admitting: Physical Medicine & Rehabilitation

## 2016-06-22 ENCOUNTER — Encounter: Payer: Self-pay | Admitting: Physical Medicine & Rehabilitation

## 2016-06-22 ENCOUNTER — Encounter: Payer: Medicare Other | Attending: Physical Medicine & Rehabilitation

## 2016-06-22 VITALS — BP 159/106 | HR 77 | Resp 14

## 2016-06-22 DIAGNOSIS — M545 Low back pain, unspecified: Secondary | ICD-10-CM

## 2016-06-22 DIAGNOSIS — M5440 Lumbago with sciatica, unspecified side: Secondary | ICD-10-CM | POA: Diagnosis not present

## 2016-06-22 DIAGNOSIS — G8929 Other chronic pain: Secondary | ICD-10-CM | POA: Diagnosis not present

## 2016-06-22 DIAGNOSIS — M47816 Spondylosis without myelopathy or radiculopathy, lumbar region: Secondary | ICD-10-CM

## 2016-06-22 MED ORDER — TRAMADOL HCL 50 MG PO TABS: 100.0000 mg | ORAL_TABLET | Freq: Three times a day (TID) | ORAL | 1 refills | Status: DC | PRN

## 2016-06-22 NOTE — Progress Notes (Signed)
Subjective:    Patient ID: Misty [name], female    DOB: November 19, 1960, 56 y.o.   MRN: [id number]  HPI Chief complaint low back pain  56 year old female with history of chronic low back pain. Patient has had this since at least 2006. She has seen numerous physicians for this, including a neurologist, sports medicine, physical medicine rehabilitation, neurosurgeon, orthopedist and anethesia/pain management. She is been seen in the emergency department, urging care, primary care office for back pain, neck pain, headaches going back to 2011.  Has history of multiple providers for narcotic analgesics based on ED note 03/30/2011 Patient's pain is midline, radiates to the right hip Patient states she started some physical therapy for shoulder pain for 12 weeks and for back pain about 6 weeks. Looking back at notes it was a total of 6 weeks, mainly concentrating, left shoulder, but there was some piriformis stretching exercises as well. She completed this about a month ago.  Patient has had lumbar x-rays, pelvic MRI, lumbar MRI. Jennefer Bravo performed in 2009 showed no significant stenosis. There was some facet arthropathy at L3-4, L4-5. Trace of the lumbar spine did show some lumbar facet arthropathy, but no disc space narrowing.  [id number] neck pain and underwent C5-6 anterior cervical fusion by Dr. Annette Stable  Has been seen by podiatry multiple visits for foot pain  Patient is able to dress and bathe herself.  Patient was previously diagnosed with multiple sclerosis and received Copaxone as well as vitamin D. Then she saw a second opinion neurologist, and [facility] who felt she did not have MS and did not require anymore treatment for that.  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  Assists Mother , travels   Pain Inventory Average Pain 7 Pain Right Now 5 My pain is sharp and burning  In the last 24 hours, has pain interfered with the following? General activity 5 Relation with others 7 Enjoyment  of life 9 What TIME of day is your pain at its worst? evening Sleep (in general) Fair  Pain is worse with: bending, standing and some activites Pain improves with: rest, pacing activities and medication Relief from Meds: no selection  Mobility walk without assistance how many minutes can you walk? 10  ability to climb steps?  no do you drive?  yes Do you have any goals in this area?  yes  Function disabled: date disabled .  Neuro/Psych weakness numbness tingling spasms confusion  Prior Studies x-rays CT/MRI nerve study new  Physicians involved in your care new   Family History  Problem Relation Age of Onset  . Stroke Mother   . Hypertension Mother   . Hyperlipidemia Mother   . Diabetes Mother   . Diabetes Brother   . Hypertension Brother   . Hypertension Brother   . Hypertension Brother   . Hypertension Brother   . Hypertension Brother   . Alcoholism Brother    Social History   Social History  . Marital status: Single    Spouse name: N/A  . Number of children: N/A  . Years of education: N/A   Social History Main Topics  . Smoking status: Current Some Day Smoker    Packs/day: 0.25    Years: 20.00    Types: Cigarettes  . Smokeless tobacco: Never Used  . Alcohol use 0.0 oz/week     Comment: 1/2 pint daily  . Drug use: No  . Sexual activity: Yes    Partners: Male   Other Topics Concern  .  None   Social History Narrative  . None   Past Surgical History:  Procedure Laterality Date  . ABDOMINAL HYSTERECTOMY    . ANTERIOR CERVICAL DECOMP/DISCECTOMY FUSION N/A 03/01/2014   Procedure: ANTERIOR CERVICAL DECOMPRESSION/DISCECTOMY FUSION 1 LEVEL;  Surgeon: Charlie Pitter, MD;  Location: [facility];  Service: Neurosurgery;  Laterality: N/A;  ANTERIOR CERVICAL DECOMPRESSION/DISCECTOMY FUSION 1 LEVEL  . BUNIONECTOMY Right 10/14/2014   @[facility]   . CESAREAN SECTION    . COLON SURGERY    . COLONOSCOPY    . FOOT SURGERY Right   . GASTRIC BYPASS    . Hammer  Toe Repair Right 10/14/2014   RT #2, @[facility]   . TENOTOMY Right 10/14/2014   RT #3, @[facility]    Past Medical History:  Diagnosis Date  . Allergy   . Arthritis   . Back pain   . Colon cancer ([place])   . Depression   . Diarrhea   . GERD (gastroesophageal reflux disease)   . Hypertension   . Insomnia   . MS (multiple sclerosis) ([place])   . Other hammer toe (acquired) 03/31/2013  . Pneumonia    hx of  . Umbilical hernia    There were no vitals taken for this visit.  Opioid Risk Score:   Fall Risk Score:  `1  Depression screen PHQ 2/9  Depression screen PHQ 2/9 06/22/2016  Decreased Interest 1  Down, Depressed, Hopeless 0  PHQ - 2 Score 1  Altered sleeping 1  Tired, decreased energy 2  Change in appetite 2  Feeling bad or failure about yourself  0  Trouble concentrating 0  Moving slowly or fidgety/restless 0  Suicidal thoughts 0  PHQ-9 Score 6    Review of Systems  Constitutional: Positive for diaphoresis and unexpected weight change.  Eyes: Negative.   Respiratory: Negative.   Cardiovascular: Negative.   Gastrointestinal: Positive for diarrhea.  Genitourinary: Negative.   Musculoskeletal: Positive for arthralgias, back pain and gait problem.       Spasms   Skin: Negative.   Allergic/Immunologic: Negative.   Neurological: Positive for weakness and numbness.       Tingling   Hematological: Negative.   Psychiatric/Behavioral: Negative.        Objective:   Physical Exam  Constitutional: She is oriented to person, place, and time. She appears well-developed and well-nourished. No distress.  HENT:  Head: Normocephalic and atraumatic.  Eyes: Conjunctivae and EOM are normal. Pupils are equal, round, and reactive to light.  Neck: Normal range of motion. Neck supple.  Cardiovascular: Normal rate, regular rhythm and normal heart sounds.   No murmur heard. Pulmonary/Chest: Effort normal and breath sounds normal. No respiratory distress. She has no wheezes.  Abdominal: Soft.  Bowel sounds are normal. She exhibits no distension. There is no tenderness.  Musculoskeletal:       Thoracic back: She exhibits decreased range of motion. She exhibits no tenderness.       Lumbar back: She exhibits decreased range of motion and tenderness.  Tenderness around L4-L5 paraspinal mainly toward the right side, radiating toward the iliac crest. No tenderness over PSIS area.  Neurological: She is alert and oriented to person, place, and time.  Reflex Scores:      Patellar reflexes are 1+ on the right side and 1+ on the left side.      Achilles reflexes are 1+ on the right side and 1+ on the left side. Neuro:  Eyes without evidence of nystagmus  Tone is normal without evidence  of spasticity Cerebellar exam shows no evidence of ataxia on finger nose finger or heel to shin testing No evidence of trunkal ataxia  Motor strength is 5/5 in bilateral deltoid, biceps, triceps, finger flexors and extensors, wrist flexors and extensors, hip flexors, knee flexors and extensors, ankle dorsiflexors, plantar flexors, invertors and evertors, toe flexors and extensors  Sensory exam is normal to pinprick,  and light touch in the upper and lower limbs      V- no facial numbness or masseter weakness    Psychiatric: Her speech is normal and behavior is normal. Judgment and thought content normal. Her affect is blunt. Cognition and memory are normal.  Nursing note and vitals reviewed.         Assessment & Plan:  1. Chronic axial back pain. She is seen multiple physicians for this. The imaging studies available to me today showed lumbar facet arthropathy, mainly L4-5, L5-S1 but no significant loss of disc space or stenosis. She states that there are some more recent imaging studies [facility] and [facility] at [facility]. Will try to get these records for review. We discussed that based on her exam as well as her imaging studies. I do not currently see an indication  for chronic narcotic analgesics. She may benefit from tramadol 50-100 mg 3 times a day Topical analgesic patch has been suggested as well. Heat rather than ice We'll order therapy focusing on core strengthening. We discussed need for weight loss. At this point, I do not plan any spinal injections until I can review what injections have been performed and also review most recent imaging studies. Discussed with patient agrees with plan.

## 2016-07-05 ENCOUNTER — Ambulatory Visit: Payer: Medicare Other | Admitting: Podiatry

## 2016-07-10 ENCOUNTER — Encounter: Payer: Self-pay | Admitting: Internal Medicine

## 2016-07-10 ENCOUNTER — Ambulatory Visit (INDEPENDENT_AMBULATORY_CARE_PROVIDER_SITE_OTHER): Payer: Medicare Other | Admitting: Internal Medicine

## 2016-07-10 VITALS — BP 150/82 | HR 108 | Ht 66.0 in | Wt 245.6 lb

## 2016-07-10 DIAGNOSIS — R05 Cough: Secondary | ICD-10-CM | POA: Diagnosis not present

## 2016-07-10 DIAGNOSIS — G894 Chronic pain syndrome: Secondary | ICD-10-CM

## 2016-07-10 DIAGNOSIS — K429 Umbilical hernia without obstruction or gangrene: Secondary | ICD-10-CM | POA: Diagnosis not present

## 2016-07-10 DIAGNOSIS — E538 Deficiency of other specified B group vitamins: Secondary | ICD-10-CM | POA: Diagnosis not present

## 2016-07-10 DIAGNOSIS — I10 Essential (primary) hypertension: Secondary | ICD-10-CM | POA: Diagnosis not present

## 2016-07-10 DIAGNOSIS — M5410 Radiculopathy, site unspecified: Secondary | ICD-10-CM | POA: Diagnosis not present

## 2016-07-10 DIAGNOSIS — R059 Cough, unspecified: Secondary | ICD-10-CM

## 2016-07-10 MED ORDER — HYDROCHLOROTHIAZIDE 12.5 MG PO CAPS: 12.5000 mg | ORAL_CAPSULE | Freq: Every day | ORAL | 1 refills | Status: DC

## 2016-07-10 MED ORDER — CYANOCOBALAMIN 1000 MCG/ML IJ SOLN
1000.0000 ug | Freq: Once | INTRAMUSCULAR | Status: AC
  Administered 2016-07-10: 1000 ug via INTRAMUSCULAR

## 2016-07-10 MED ORDER — LISINOPRIL 20 MG PO TABS: 20.0000 mg | ORAL_TABLET | Freq: Every day | ORAL | 1 refills | Status: DC

## 2016-07-10 MED ORDER — VENTOLIN HFA 108 (90 BASE) MCG/ACT IN AERS: 1.0000 | INHALATION_SPRAY | Freq: Four times a day (QID) | RESPIRATORY_TRACT | 2 refills | Status: DC | PRN

## 2016-07-10 NOTE — Patient Instructions (Addendum)
    A B12 injection was given  Medications reviewed and updated.  No changes recommended at this time.  Your prescription(s) have been submitted to your pharmacy. Please take as directed and contact our office if you believe you are having problem(s) with the medication(s).  A referral was ordered for  Pain managemnet  Please followup in 3 months with me; 1 month for B12 inj

## 2016-07-10 NOTE — Assessment & Plan Note (Signed)
Having some pain - discussed referral to surgery - she declined - she does not feel it is that bad Will monitor

## 2016-07-10 NOTE — Progress Notes (Signed)
Subjective:    Patient ID: Misty [name], female    DOB: 05-Sep-1960, 56 y.o.   MRN: [id number]  HPI The patient is here for follow up.  Hypertension: She is not taking her medication daily - she was running low and started taking 1/2 of her lisinopril and was only taking 1/2 of the hctz. She is not compliant with a low sodium diet.  She denies chest pain, palpitations, edema, shortness of breath and regular headaches. She is not exercising regularly.  She does not monitor her blood pressure at home.    Chronic back pain:  The injections did not work.  She is following with pain management.  Percocet was helping, but she was told she they will only prescribe tramadol - no more percocet.  The tramadol does not help her pain.  Would like to go someplace else and would like a referral - she has the name of the place.   Vitamin B-12 deficiency:  she had a gastric bypass surgery years ago and does not absorb vitamin b 12. She would like to continue monthly B12 injections in the office.  She is having some pain from her hernia.   Medications and allergies reviewed with patient and updated if appropriate.  Patient Active Problem List   Diagnosis Date Noted  . Spinal stenosis of lumbar region 11/29/2015  . Positive urine drug screen 11/16/2015  . Chronic pain syndrome 10/12/2015  . Umbilical hernia 82/50/5397  . Sleep difficulties 07/15/2015  . Bunion, right 06/20/2015  . Callus of foot 04/27/2015  . Muscle spasm 04/16/2015  . B12 deficiency 03/30/2015  . Hammer toe of left foot 02/16/2015  . Fibrosis of skin of lower extremity 02/16/2015  . Status post right foot surgery 10/26/2014  . Cervical spondylosis with radiculopathy 03/01/2014  . Hammer toe of right foot 01/26/2014  . Benign intracranial hypertension 06/18/2013  . Pain in lower limb 05/22/2013  . Abnormal gait 04/21/2013  . Arthritis 04/21/2013  . Disturbance of skin sensation 04/21/2013  . Cephalalgia 04/21/2013  .  Porokeratosis 03/31/2013  . Pain, foot 03/31/2013  . Vitamin D deficiency 04/18/2010  . Fatigue 04/14/2010  . Back pain with right-sided radiculopathy 11/29/2008  . MEDIAL MENISCUS TEAR, LEFT 06/30/2008  . BARRETTS ESOPHAGUS 06/28/2008  . OSTEOARTHRITIS, KNEES, BILATERAL 06/28/2008  . OBESITY 05/24/2008  . HYPERTENSION, BENIGN ESSENTIAL 05/24/2008  . IRRITABLE BOWEL SYNDROME 05/24/2008  . BARIATRIC SURGERY STATUS 03/19/2006  . NEOPLASM, MALIGNANT, COLON, HX OF 03/19/1997    Current Outpatient Prescriptions on File Prior to Visit  Medication Sig Dispense Refill  . aspirin 81 MG tablet Take 81 mg by mouth daily.    . baclofen (LIORESAL) 10 MG tablet Take 10 mg by mouth 2 (two) times daily.    . Cholecalciferol (VITAMIN D) 2000 units CAPS Take 1 capsule (2,000 Units total) by mouth daily. 90 capsule 1  . cyanocobalamin (,VITAMIN B-12,) 1000 MCG/ML injection INJECT 1,000 MCG AS DIRECTED EVERY 30 DAYS. 30 mL 1  . hydrochlorothiazide (MICROZIDE) 12.5 MG capsule Take 1 capsule (12.5 mg total) by mouth daily. 90 capsule 1  . lisinopril (PRINIVIL,ZESTRIL) 20 MG tablet Take 1 tablet (20 mg total) by mouth daily. 90 tablet 3  . oxyCODONE-acetaminophen (PERCOCET) 7.5-325 MG tablet Take 1 tablet by mouth every 4 (four) hours as needed for severe pain.    . traMADol (ULTRAM) 50 MG tablet Take 2 tablets (100 mg total) by mouth every 8 (eight) hours as needed. 180 tablet 1  .  VENTOLIN HFA 108 (90 Base) MCG/ACT inhaler Inhale 1 puff into the lungs every 6 (six) hours as needed for wheezing or shortness of breath. 1 Inhaler 2  . vitamin B-6 (PYRIDOXINE) 25 MG tablet Take 25 mg by mouth daily.     No current facility-administered medications on file prior to visit.     Past Medical History:  Diagnosis Date  . Allergy   . Arthritis   . Back pain   . Colon cancer ([place])   . Depression   . Diarrhea   . GERD (gastroesophageal reflux disease)   . Hypertension   . Insomnia   . MS (multiple sclerosis)  ([place])   . Other hammer toe (acquired) 03/31/2013  . Pneumonia    hx of  . Umbilical hernia     Past Surgical History:  Procedure Laterality Date  . ABDOMINAL HYSTERECTOMY    . ANTERIOR CERVICAL DECOMP/DISCECTOMY FUSION N/A 03/01/2014   Procedure: ANTERIOR CERVICAL DECOMPRESSION/DISCECTOMY FUSION 1 LEVEL;  Surgeon: Charlie Pitter, MD;  Location: [facility];  Service: Neurosurgery;  Laterality: N/A;  ANTERIOR CERVICAL DECOMPRESSION/DISCECTOMY FUSION 1 LEVEL  . BUNIONECTOMY Right 10/14/2014   @[facility]   . CESAREAN SECTION    . COLON SURGERY    . COLONOSCOPY    . FOOT SURGERY Right   . GASTRIC BYPASS    . Hammer Toe Repair Right 10/14/2014   RT #2, @[facility]   . TENOTOMY Right 10/14/2014   RT #3, @[facility]     Social History   Social History  . Marital status: Single    Spouse name: N/A  . Number of children: N/A  . Years of education: N/A   Social History Main Topics  . Smoking status: Current Some Day Smoker    Packs/day: 0.25    Years: 20.00    Types: Cigarettes  . Smokeless tobacco: Never Used  . Alcohol use 0.0 oz/week     Comment: 1/2 pint daily  . Drug use: No  . Sexual activity: Yes    Partners: Male   Other Topics Concern  . None   Social History Narrative  . None    Family History  Problem Relation Age of Onset  . Stroke Mother   . Hypertension Mother   . Hyperlipidemia Mother   . Diabetes Mother   . Diabetes Brother   . Hypertension Brother   . Hypertension Brother   . Hypertension Brother   . Hypertension Brother   . Hypertension Brother   . Alcoholism Brother     Review of Systems  Constitutional: Negative for chills and fever.  Respiratory: Positive for cough. Negative for shortness of breath and wheezing.   Cardiovascular: Negative for chest pain, palpitations and leg swelling.  Neurological: Negative for light-headedness and headaches.       Objective:   Vitals:   07/10/16 1352  BP: (!) 150/82  Pulse: (!) 108   Wt Readings from Last 3  Encounters:  07/10/16 245 lb 9.6 oz (111.4 kg)  05/21/16 242 lb (109.8 kg)  04/28/16 240 lb (108.9 kg)   Body mass index is 39.64 kg/m.   Physical Exam    Constitutional: Appears well-developed and well-nourished. No distress.  HENT:  Head: Normocephalic and atraumatic.  Neck: Neck supple. No tracheal deviation present. No thyromegaly present.  No cervical lymphadenopathy Cardiovascular: Normal rate, regular rhythm and normal heart sounds.   No murmur heard. No carotid bruit .  No edema Pulmonary/Chest: Effort normal and breath sounds normal. No respiratory distress. No has  no wheezes. No rales.  Abdomen: umbilical hernia -not incarcerated, minimally tender Skin: Skin is warm and dry. Not diaphoretic.  Psychiatric: Normal mood and affect. Behavior is normal.      Assessment & Plan:    See Problem List for Assessment and Plan of chronic medical problems.

## 2016-07-10 NOTE — Assessment & Plan Note (Signed)
?   Not controlled Not taking the correct doses of medication Refill both meds Goal < 130/80

## 2016-07-10 NOTE — Assessment & Plan Note (Signed)
Continue monthly B12 injections in office - not absorbing due to gastric bypass Injection today

## 2016-07-10 NOTE — Assessment & Plan Note (Signed)
Pain medications and management by pain management - she would like to go someplace new - referral ordered Working on increasing activity and weight loss

## 2016-07-10 NOTE — Assessment & Plan Note (Signed)
Referred to [facility] management [facility]

## 2016-07-18 ENCOUNTER — Telehealth: Payer: Self-pay | Admitting: Internal Medicine

## 2016-07-19 ENCOUNTER — Ambulatory Visit (INDEPENDENT_AMBULATORY_CARE_PROVIDER_SITE_OTHER): Payer: Medicare Other | Admitting: Podiatry

## 2016-07-19 ENCOUNTER — Encounter: Payer: Self-pay | Admitting: Podiatry

## 2016-07-19 DIAGNOSIS — M21611 Bunion of right foot: Secondary | ICD-10-CM

## 2016-07-19 DIAGNOSIS — Q828 Other specified congenital malformations of skin: Secondary | ICD-10-CM | POA: Diagnosis not present

## 2016-07-19 NOTE — Progress Notes (Signed)
This patient presents the office with chief complaint of continued pain noted from the callus on both feet. She states that the callus becomes very painful walking and wearing her shoes. She says she has  had previous surgery in an attempt to eliminate her pain but her pain persists.  She now says that she presents the office routinely for treatment of her painful calluses on both feet   GENERAL APPEARANCE: Alert, conversant. Appropriately groomed. No acute distress.  VASCULAR: Pedal pulses are  palpable at  [facility] and PT bilateral.  Capillary refill time is immediate to all digits,  Normal temperature gradient.  Digital hair growth is present bilateral  NEUROLOGIC: sensation is normal to 5.07 monofilament at 5/5 sites bilateral.  Light touch is intact bilateral, Muscle strength normal.  MUSCULOSKELETAL: acceptable muscle strength, tone and stability bilateral.  Intrinsic muscluature intact bilateral.  Rectus appearance of foot and digits noted bilateral. Heloma durum 5th toe left foot.  Painful distal clavi second toe right foot.  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  DERMATOLOGIC: skin color, texture, and turgor are within normal limits.  No preulcerative lesions or ulcers  are seen, no interdigital maceration noted.  No open lesions present.  Digital nails are asymptomatic. No drainage noted.Porokeratosis sub 3 left, sub 1,3,5 right foot.  Porokeratosis  B/l  Debridement of porokeratosis  B/L.  RTC 10 weeks.     Gardiner Barefoot DPM

## 2016-07-20 ENCOUNTER — Ambulatory Visit (HOSPITAL_BASED_OUTPATIENT_CLINIC_OR_DEPARTMENT_OTHER): Payer: Medicare Other | Admitting: Physical Medicine & Rehabilitation

## 2016-07-20 ENCOUNTER — Telehealth: Payer: Self-pay | Admitting: Physical Medicine & Rehabilitation

## 2016-07-20 ENCOUNTER — Encounter: Payer: Medicare Other | Attending: Physical Medicine & Rehabilitation

## 2016-07-20 ENCOUNTER — Encounter: Payer: Self-pay | Admitting: Physical Medicine & Rehabilitation

## 2016-07-20 VITALS — BP 128/80 | HR 72

## 2016-07-20 DIAGNOSIS — M545 Low back pain, unspecified: Secondary | ICD-10-CM | POA: Insufficient documentation

## 2016-07-20 DIAGNOSIS — R222 Localized swelling, mass and lump, trunk: Secondary | ICD-10-CM

## 2016-07-20 DIAGNOSIS — G8929 Other chronic pain: Secondary | ICD-10-CM

## 2016-07-20 DIAGNOSIS — M1611 Unilateral primary osteoarthritis, right hip: Secondary | ICD-10-CM | POA: Insufficient documentation

## 2016-07-20 DIAGNOSIS — M533 Sacrococcygeal disorders, not elsewhere classified: Secondary | ICD-10-CM

## 2016-07-20 MED ORDER — MELOXICAM 7.5 MG PO TABS: 7.5000 mg | ORAL_TABLET | Freq: Every day | ORAL | 1 refills | Status: DC

## 2016-07-20 NOTE — Telephone Encounter (Signed)
Requested medical records from Dr. Maia Petties for cont of care

## 2016-07-20 NOTE — Progress Notes (Signed)
Subjective:    Patient ID: Misty [name], female    DOB: 23-Dec-1960, 56 y.o.   MRN: [id number]  HPI  Chief complaint is low back pain, right sided, radiating into the buttock. Discussed her hx again.  She states that last year, she had a lumbar MRI at [facility] imaging. Looking through Epic, there was only a pelvic MRI. She was not aware that it was not a lumbar Dr Maia Petties was prior PMR MD, tried several types of injections per patient, but she does not recall whether these were hip injections or back injections. Do not have any notes from her or from Dr. Henriette Combs office Hemangioma at S3 on pelvic MRI, recommendations were for follow-up. ".1. 1.7 x 1.2 x 1.1 cm T2 hyperintense, T1 intermediate signal enhancing lesion in the S3 vertebral body. The lesion demonstrates a stippled appearance best seen on image 13/series 10. This likely reflects an atypical hemangioma, but metastatic disease cannot be completely excluded although considered less likely." Follow-up MRI of the pelvis in 3-6 months is recommended. She has no pain that goes down below her knee. No numbness or tingling in her legs. No bowel or bladder dysfunction. No recent fevers or weight loss  Patient specifically asking for oxycodone again Pain Inventory Average Pain 7 Pain Right Now 8 My pain is sharp and burning  In the last 24 hours, has pain interfered with the following? General activity 7 Relation with others 5 Enjoyment of life 0 What TIME of day is your pain at its worst? morning Sleep (in general) Poor  Pain is worse with: walking, bending, sitting and standing Pain improves with: . Relief from Meds: 0  Mobility walk without assistance ability to climb steps?  yes do you drive?  yes  Function disabled: date disabled .  Neuro/Psych tingling trouble walking  Prior Studies Any changes since last visit?  no  Physicians involved in your care Any changes since last visit?  no   Family History   Problem Relation Age of Onset  . Stroke Mother   . Hypertension Mother   . Hyperlipidemia Mother   . Diabetes Mother   . Diabetes Brother   . Hypertension Brother   . Hypertension Brother   . Hypertension Brother   . Hypertension Brother   . Hypertension Brother   . Alcoholism Brother    Social History   Social History  . Marital status: Single    Spouse name: N/A  . Number of children: N/A  . Years of education: N/A   Social History Main Topics  . Smoking status: Current Some Day Smoker    Packs/day: 0.25    Years: 20.00    Types: Cigarettes  . Smokeless tobacco: Never Used  . Alcohol use 0.0 oz/week     Comment: 1/2 pint daily  . Drug use: No  . Sexual activity: Yes    Partners: Male   Other Topics Concern  . Not on file   Social History Narrative  . No narrative on file   Past Surgical History:  Procedure Laterality Date  . ABDOMINAL HYSTERECTOMY    . ANTERIOR CERVICAL DECOMP/DISCECTOMY FUSION N/A 03/01/2014   Procedure: ANTERIOR CERVICAL DECOMPRESSION/DISCECTOMY FUSION 1 LEVEL;  Surgeon: Charlie Pitter, MD;  Location: [place] [facility];  Service: Neurosurgery;  Laterality: N/A;  ANTERIOR CERVICAL DECOMPRESSION/DISCECTOMY FUSION 1 LEVEL  . BUNIONECTOMY Right 10/14/2014   @[facility]   . CESAREAN SECTION    . COLON SURGERY    . COLONOSCOPY    .  FOOT SURGERY Right   . GASTRIC BYPASS    . Hammer Toe Repair Right 10/14/2014   RT #2, @[facility]   . TENOTOMY Right 10/14/2014   RT #3, @[facility]    Past Medical History:  Diagnosis Date  . Allergy   . Arthritis   . Back pain   . Colon cancer ([place])   . Depression   . Diarrhea   . GERD (gastroesophageal reflux disease)   . Hypertension   . Insomnia   . MS (multiple sclerosis) ([place])   . Other hammer toe (acquired) 03/31/2013  . Pneumonia    hx of  . Umbilical hernia    There were no vitals taken for this visit.  Opioid Risk Score:   Fall Risk Score:  `1  Depression screen PHQ 2/9  Depression screen [facility] 2/9 07/10/2016 06/22/2016    Decreased Interest 0 1  Down, Depressed, Hopeless 0 0  PHQ - 2 Score 0 1  Altered sleeping - 1  Tired, decreased energy - 2  Change in appetite - 2  Feeling bad or failure about yourself  - 0  Trouble concentrating - 0  Moving slowly or fidgety/restless - 0  Suicidal thoughts - 0  PHQ-9 Score - 6  Some recent data might be hidden    Review of Systems  Constitutional: Positive for diaphoresis.  HENT: Negative.   Eyes: Negative.   Respiratory: Negative.   Cardiovascular: Negative.   Gastrointestinal: Negative.   Endocrine: Negative.   Genitourinary: Negative.   Musculoskeletal: Negative.   Skin: Negative.   Allergic/Immunologic: Negative.   Neurological: Negative.   Hematological: Negative.   Psychiatric/Behavioral: Negative.   All other systems reviewed and are negative.      Objective:   Physical Exam  Constitutional: She appears well-developed and well-nourished.  HENT:  Head: Normocephalic and atraumatic.  Eyes: Conjunctivae and EOM are normal. Pupils are equal, round, and reactive to light.  Neck: Normal range of motion.  Musculoskeletal:  No sacrococcygeal tenderness Lumbar range of motion limited   Neurological: Coordination and gait normal.  Reflex Scores:      Patellar reflexes are 0 on the right side and 0 on the left side.      Achilles reflexes are 0 on the right side and 0 on the left side. Patient did not relax adequately to obtain Deep tendon reflexes  5/5 strength in bilateral hip flexors, knee extensors, ankle dorsi flexion, plantar flexor  Sensation intact to pinprick, bilateral L2, L3, L4, L5, S1 dermatomal distribution  Psychiatric: Her speech is normal and behavior is normal. Judgment and thought content normal. Her affect is labile.  Irritable when discussing her pain meds  Poor historian  Nursing note and vitals reviewed.  No pain over the sacral area. There is pain over the gluteal area around the gluteus medius area No pain over the  greater trochanters  There is some mild pain with right hip internal rotation none with external rotation. She has good range of motion bilaterally at the hips. Negative straight leg raising  Lumbar range of motion 50% flexion, extension, lateral bending and rotation       Assessment & Plan:  1. Low back pain appears to be mainly myofascial. Given tenderness, however, she does have MRI from last October demonstrating a small mass in the S3 vertebral body. She does have moderate osteoarthritis of the right hip as well but no significant limitations of her range of motion. She does not give a clearcut history of one. Her  pain is the worst, other than saying it hurts frequently in different positions. She has not improved after a month of conservative care. I think it's reasonable to repeat a lumbar MRI, but to include S3.   As discussed with the patient. I do not see any objective evidence to justify the use of schedule 2 narcotic analgesic. We'll continue tramadol. We will add meloxicam Patient will follow up after MRI  Patient states that she has asked her primary to refer her elsewhere to someone who will prescribe her oxycodone. This is documented in the last primary care note.  I sent a message to [facility] requesting additional records She was discharged for multiple prescribers according to her own report. I would like to see what type of imaging studies. They performed as well as specific injections that were performed and reporting on response to injections.

## 2016-07-30 DIAGNOSIS — H25013 Cortical age-related cataract, bilateral: Secondary | ICD-10-CM | POA: Diagnosis not present

## 2016-07-30 DIAGNOSIS — H35362 Drusen (degenerative) of macula, left eye: Secondary | ICD-10-CM | POA: Diagnosis not present

## 2016-07-30 DIAGNOSIS — H2513 Age-related nuclear cataract, bilateral: Secondary | ICD-10-CM | POA: Diagnosis not present

## 2016-07-30 DIAGNOSIS — H35033 Hypertensive retinopathy, bilateral: Secondary | ICD-10-CM | POA: Diagnosis not present

## 2016-08-01 ENCOUNTER — Ambulatory Visit
Admission: RE | Admit: 2016-08-01 | Discharge: 2016-08-01 | Disposition: A | Discharge status: Home / Self Care | Payer: Medicare Other | Source: Ambulatory Visit | Attending: Physical Medicine & Rehabilitation | Admitting: Physical Medicine & Rehabilitation

## 2016-08-01 DIAGNOSIS — M48061 Spinal stenosis, lumbar region without neurogenic claudication: Secondary | ICD-10-CM | POA: Diagnosis not present

## 2016-08-01 DIAGNOSIS — G8929 Other chronic pain: Secondary | ICD-10-CM

## 2016-08-01 DIAGNOSIS — M533 Sacrococcygeal disorders, not elsewhere classified: Secondary | ICD-10-CM

## 2016-08-01 DIAGNOSIS — M545 Low back pain, unspecified: Secondary | ICD-10-CM

## 2016-08-01 DIAGNOSIS — R222 Localized swelling, mass and lump, trunk: Secondary | ICD-10-CM

## 2016-08-10 ENCOUNTER — Encounter: Payer: Self-pay | Admitting: Physical Medicine & Rehabilitation

## 2016-08-10 ENCOUNTER — Ambulatory Visit (HOSPITAL_BASED_OUTPATIENT_CLINIC_OR_DEPARTMENT_OTHER): Payer: Medicare Other | Admitting: Physical Medicine & Rehabilitation

## 2016-08-10 VITALS — BP 122/83 | HR 93

## 2016-08-10 DIAGNOSIS — G894 Chronic pain syndrome: Secondary | ICD-10-CM

## 2016-08-10 DIAGNOSIS — Z79899 Other long term (current) drug therapy: Secondary | ICD-10-CM

## 2016-08-10 DIAGNOSIS — M545 Low back pain: Secondary | ICD-10-CM | POA: Diagnosis not present

## 2016-08-10 DIAGNOSIS — M48061 Spinal stenosis, lumbar region without neurogenic claudication: Secondary | ICD-10-CM

## 2016-08-10 DIAGNOSIS — G8929 Other chronic pain: Secondary | ICD-10-CM | POA: Diagnosis not present

## 2016-08-10 DIAGNOSIS — Z5181 Encounter for therapeutic drug level monitoring: Secondary | ICD-10-CM

## 2016-08-10 MED ORDER — TRAMADOL HCL 50 MG PO TABS: 100.0000 mg | ORAL_TABLET | Freq: Three times a day (TID) | ORAL | 1 refills | Status: DC | PRN

## 2016-08-10 NOTE — Progress Notes (Signed)
Subjective:    Patient ID: Misty [name], female    DOB: 17-Jun-1960, 56 y.o.   MRN: [id number]  HPI Chief complaint low back pain  56 year old female with history of chronic low back pain. Patient has had this since at least 2006. She has seen numerous physicians for this, including a neurologist, sports medicine, physical medicine rehabilitation, neurosurgeon, orthopedist and anethesia/pain management. She is been seen in the emergency department, urging care, primary care office for back pain, neck pain, headaches going back to 2011.  Has history of multiple providers for narcotic analgesics based on ED note 03/30/2011 Patient's pain is midline, radiates to the right hip Patient states she started some physical therapy for shoulder pain for 12 weeks and for back pain about 6 weeks. Looking back at notes it was a total of 6 weeks, mainly concentrating, left shoulder, but there was some piriformis stretching exercises as well. She completed this about a month ago.  Patient has had lumbar x-rays, pelvic MRI, lumbar MRI. Jennefer Bravo performed in 2009 showed no significant stenosis. There was some facet arthropathy at L3-4, L4-5. Trace of the lumbar spine did show some lumbar facet arthropathy, but no disc space narrowing.  [id number] neck pain and underwent C5-6 anterior cervical fusion by Dr. Annette Stable  Has been seen by podiatry multiple visits for foot pain  Patient is able to dress and bathe herself.  Patient was previously diagnosed with multiple sclerosis and received Copaxone as well as vitamin D. Then she saw a second opinion neurologist, and [facility] who felt she did not have MS and did not require anymore treatment for that.  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  Assists Mother , travels   Pain Inventory Average Pain 7 Pain Right Now 5 My pain is sharp and burning  In the last 24 hours, has pain interfered with the following? General activity 5 Relation with others  7 Enjoyment of life 9 What TIME of day is your pain at its worst? evening Sleep (in general) Fair  Pain is worse with: bending, standing and some activites Pain improves with: rest, pacing activities and medication Relief from Meds: no selection  Mobility walk without assistance how many minutes can you walk? 10  ability to climb steps?  no do you drive?  yes Do you have any goals in this area?  yes  Function disabled: date disabled .  Neuro/Psych weakness numbness tingling spasms confusion  Prior Studies x-rays CT/MRI nerve study new  Physicians involved in your care new   Family History  Problem Relation Age of Onset  . Stroke Mother   . Hypertension Mother   . Hyperlipidemia Mother   . Diabetes Mother   . Diabetes Brother   . Hypertension Brother   . Hypertension Brother   . Hypertension Brother   . Hypertension Brother   . Hypertension Brother   . Alcoholism Brother    Social History   Social History  . Marital status: Single    Spouse name: N/A  . Number of children: N/A  . Years of education: N/A   Social History Main Topics  . Smoking status: Current Some Day Smoker    Packs/day: 0.25    Years: 20.00    Types: Cigarettes  . Smokeless tobacco: Never Used  . Alcohol use 0.0 oz/week     Comment: 1/2 pint daily  . Drug use: No  . Sexual activity: Yes    Partners: Male   Other Topics Concern  .  None   Social History Narrative  . None   Past Surgical History:  Procedure Laterality Date  . ABDOMINAL HYSTERECTOMY    . ANTERIOR CERVICAL DECOMP/DISCECTOMY FUSION N/A 03/01/2014   Procedure: ANTERIOR CERVICAL DECOMPRESSION/DISCECTOMY FUSION 1 LEVEL;  Surgeon: Charlie Pitter, MD;  Location: [place] [facility];  Service: Neurosurgery;  Laterality: N/A;  ANTERIOR CERVICAL DECOMPRESSION/DISCECTOMY FUSION 1 LEVEL  . BUNIONECTOMY Right 10/14/2014   @[facility]   . CESAREAN SECTION    . COLON SURGERY    . COLONOSCOPY    . FOOT SURGERY Right   . GASTRIC BYPASS     . Hammer Toe Repair Right 10/14/2014   RT #2, @[facility]   . TENOTOMY Right 10/14/2014   RT #3, @[facility]    Past Medical History:  Diagnosis Date  . Allergy   . Arthritis   . Back pain   . Colon cancer ([place])   . Depression   . Diarrhea   . GERD (gastroesophageal reflux disease)   . Hypertension   . Insomnia   . MS (multiple sclerosis) ([place])   . Other hammer toe (acquired) 03/31/2013  . Pneumonia    hx of  . Umbilical hernia    BP 846/96   Pulse 93   SpO2 98%   Opioid Risk Score:   Fall Risk Score:  `1  Depression screen PHQ 2/9  Depression screen [facility] 2/9 07/10/2016 06/22/2016  Decreased Interest 0 1  Down, Depressed, Hopeless 0 0  PHQ - 2 Score 0 1  Altered sleeping - 1  Tired, decreased energy - 2  Change in appetite - 2  Feeling bad or failure about yourself  - 0  Trouble concentrating - 0  Moving slowly or fidgety/restless - 0  Suicidal thoughts - 0  PHQ-9 Score - 6  Some recent data might be hidden    Review of Systems  Constitutional: Positive for diaphoresis and unexpected weight change.  Eyes: Negative.   Respiratory: Negative.   Cardiovascular: Negative.   Gastrointestinal: Positive for diarrhea.  Genitourinary: Negative.   Musculoskeletal: Positive for arthralgias, back pain and gait problem.       Spasms   Skin: Negative.   Allergic/Immunologic: Negative.   Neurological: Positive for weakness and numbness.       Tingling   Hematological: Negative.   Psychiatric/Behavioral: Negative.        Objective:   Physical Exam  Constitutional: She is oriented to person, place, and time. She appears well-developed and well-nourished. No distress.  HENT:  Head: Normocephalic and atraumatic.  Eyes: Conjunctivae and EOM are normal. Pupils are equal, round, and reactive to light.  Neck: Normal range of motion. Neck supple.  Cardiovascular: Normal rate, regular rhythm and normal heart sounds.   No murmur heard. Pulmonary/Chest: Effort normal and breath sounds  normal. No respiratory distress. She has no wheezes.  Abdominal: Soft. Bowel sounds are normal. She exhibits no distension. There is no tenderness.  Musculoskeletal:       Thoracic back: She exhibits decreased range of motion. She exhibits no tenderness.       Lumbar back: She exhibits decreased range of motion and tenderness.  Tenderness around L4-L5 paraspinal mainly toward the right side, radiating toward the iliac crest. No tenderness over PSIS area.  Neurological: She is alert and oriented to person, place, and time.  Reflex Scores:      Patellar reflexes are 1+ on the right side and 1+ on the left side.      Achilles reflexes are 1+ on  the right side and 1+ on the left side. Neuro:  Eyes without evidence of nystagmus  Tone is normal without evidence of spasticity Cerebellar exam shows no evidence of ataxia on finger nose finger or heel to shin testing No evidence of trunkal ataxia  Motor strength is 5/5 in bilateral deltoid, biceps, triceps, finger flexors and extensors, wrist flexors and extensors, hip flexors, knee flexors and extensors, ankle dorsiflexors, plantar flexors, invertors and evertors, toe flexors and extensors  Sensory exam is normal to pinprick,  and light touch in the upper and lower limbs      V- no facial numbness or masseter weakness    Psychiatric: Her speech is normal and behavior is normal. Judgment and thought content normal. Her affect is blunt. Cognition and memory are normal.  Nursing note and vitals reviewed.         Assessment & Plan:  1. Chronic axial back pain. She is seen multiple physicians for this. The imaging studies available to me today showed lumbar facet arthropathy, mainly L4-5, L5-S1 but no significant loss of disc space or stenosis. She states that there are some more recent imaging studies performed [facility] and perhaps at [facility]. Will try to get these records for review. We discussed that based on her  exam as well as her imaging studies. I do not currently see an indication for chronic narcotic analgesics. She may benefit from tramadol 50-100 mg 3 times a day Topical analgesic patch has been suggested as well. Heat rather than ice We'll order therapy focusing on core strengthening. We discussed need for weight loss. At this point, I do not plan any spinal injections until I can review what injections have been performed and also review most recent imaging studies. Discussed with patient agrees with plan.  Subjective:    Patient ID: Misty [name], female    DOB: 01/14/1961, 56 y.o.   MRN: [id number]  HPI Here to follow-up on MRI lumbar spine. Reviewed notes from spine and scoliosis specialist. Underwent diagnostic right L3, L4, L5 medial branch blocks. Dr. Maia Petties recorded that post injection pain was 0. Right L4 selective nerve root block was performed 01/04/2016. On 02/21/2016 underwent epidural injection, right L3 transforaminal  Has fair to good relief with tramadol 2 tablets 3 times a day. Over the last 3 weeks it develops nausea which occurs only at the end of the day. She has tried cutting down to 1 tablet of tramadol, but this has not changed her nausea. She plans to follow-up with the gastroenterologist. She denies using any Goody's powders or anti-inflammatories. Continues to take her baby aspirin 1 tablet per day Pain Inventory Average Pain 7 Pain Right Now 5 My pain is sharp and burning  In the last 24 hours, has pain interfered with the following? General activity 2 Relation with others 2 Enjoyment of life 2 What TIME of day is your pain at its worst? evening Sleep (in general) Poor  Pain is worse with: walking, bending and standing Pain improves with: medication Relief from Meds: 6  Mobility walk without assistance ability to climb steps?  yes do you drive?  yes  Function disabled: date disabled .  Neuro/Psych numbness tingling  Prior Studies Any  changes since last visit?  no  Physicians involved in your care Any changes since last visit?  no   Family History  Problem Relation Age of Onset  . Stroke Mother   . Hypertension Mother   . Hyperlipidemia Mother   .  Diabetes Mother   . Diabetes Brother   . Hypertension Brother   . Hypertension Brother   . Hypertension Brother   . Hypertension Brother   . Hypertension Brother   . Alcoholism Brother    Social History   Social History  . Marital status: Single    Spouse name: N/A  . Number of children: N/A  . Years of education: N/A   Social History Main Topics  . Smoking status: Current Some Day Smoker    Packs/day: 0.25    Years: 20.00    Types: Cigarettes  . Smokeless tobacco: Never Used  . Alcohol use 0.0 oz/week     Comment: 1/2 pint daily  . Drug use: No  . Sexual activity: Yes    Partners: Male   Other Topics Concern  . Not on file   Social History Narrative  . No narrative on file   Past Surgical History:  Procedure Laterality Date  . ABDOMINAL HYSTERECTOMY    . ANTERIOR CERVICAL DECOMP/DISCECTOMY FUSION N/A 03/01/2014   Procedure: ANTERIOR CERVICAL DECOMPRESSION/DISCECTOMY FUSION 1 LEVEL;  Surgeon: Charlie Pitter, MD;  Location: [place] [facility];  Service: Neurosurgery;  Laterality: N/A;  ANTERIOR CERVICAL DECOMPRESSION/DISCECTOMY FUSION 1 LEVEL  . BUNIONECTOMY Right 10/14/2014   @[facility]   . CESAREAN SECTION    . COLON SURGERY    . COLONOSCOPY    . FOOT SURGERY Right   . GASTRIC BYPASS    . Hammer Toe Repair Right 10/14/2014   RT #2, @[facility]   . TENOTOMY Right 10/14/2014   RT #3, @[facility]    Past Medical History:  Diagnosis Date  . Allergy   . Arthritis   . Back pain   . Colon cancer ([place])   . Depression   . Diarrhea   . GERD (gastroesophageal reflux disease)   . Hypertension   . Insomnia   . MS (multiple sclerosis) ([place])   . Other hammer toe (acquired) 03/31/2013  . Pneumonia    hx of  . Umbilical hernia    There were no vitals taken for this  visit.  Opioid Risk Score:   Fall Risk Score:  `1  Depression screen PHQ 2/9  Depression screen [facility] 2/9 07/10/2016 06/22/2016  Decreased Interest 0 1  Down, Depressed, Hopeless 0 0  PHQ - 2 Score 0 1  Altered sleeping - 1  Tired, decreased energy - 2  Change in appetite - 2  Feeling bad or failure about yourself  - 0  Trouble concentrating - 0  Moving slowly or fidgety/restless - 0  Suicidal thoughts - 0  PHQ-9 Score - 6  Some recent data might be hidden    Review of Systems  Constitutional: Positive for diaphoresis, fever and unexpected weight change.  HENT: Negative.   Eyes: Negative.   Respiratory: Negative.   Cardiovascular: Negative.   Gastrointestinal: Positive for nausea.  Endocrine: Negative.   Genitourinary: Negative.   Musculoskeletal: Negative.   Skin: Negative.   Allergic/Immunologic: Negative.   Neurological: Negative.   Hematological: Negative.   Psychiatric/Behavioral: Negative.   All other systems reviewed and are negative.      Objective:   Physical Exam  Constitutional: She is oriented to person, place, and time. She appears well-developed and well-nourished.  HENT:  Head: Normocephalic and atraumatic.  Eyes: Conjunctivae and EOM are normal. Pupils are equal, round, and reactive to light.  Neurological: She is alert and oriented to person, place, and time.  Psychiatric: She has a normal mood and affect. Her  behavior is normal. Judgment and thought content normal.  Nursing note and vitals reviewed. Motor strength is 5/5 bilateral deltoid, biceps, triceps, grip, hip flexor, knee extensor, ankle dorsiflexor. Lumbar spine has limited lumbar extension as well as lateral bending. Full lumbar flexion. There is mild tenderness palpation bilateral lumbar paraspinal muscles. Negative straight leg raising Gait is without evidence of toe drag or knee instability      Assessment & Plan:  1. Lumbar spinal stenosis. We went over her MRI findings. She does  not have any signs of myelopathy or radiculopathy, and no neurogenic claudication. Her predominant pain is right-sided in the lower lumbar area. We discussed her previous response to medial branch blocks, which was positive and short-term. We discussed lumbar spine anatomy and looked at the MRI films. Would recommend repeat right L3, L4 medial branch block and L5 dorsal ramus injection under fluoroscopic guidance and if a greater than 50% relief, proceed to lumbar radiofrequency given that within the last 6 months she has had a positive short-term response to a right-sided L3, L4 medial branch block and L5 dorsal ramus injection by another physical medicine and rehabilitation physician  Continue tramadol 2 tablets 3 times a day. It is unlikely that this is the main cause of her nausea. We discussed that oxycodone tends to cause more nausea than the tramadol.

## 2016-08-10 NOTE — Patient Instructions (Signed)
Right L3-4-5 MBB

## 2016-08-16 LAB — TOXASSURE SELECT,+ANTIDEPR,UR

## 2016-08-17 ENCOUNTER — Telehealth: Payer: Self-pay | Admitting: *Deleted

## 2016-08-17 NOTE — Telephone Encounter (Signed)
Please call patient to inform her of positive EtOH, offer referral for treatment. Warn of possible interaction with tramadol

## 2016-08-17 NOTE — Telephone Encounter (Signed)
Urine drug screen for this encounter is inconsistent due to the presence of both ETG/ETS (alcohol). Tramadol was present as expected.

## 2016-08-20 NOTE — Telephone Encounter (Signed)
I spoke with Misty [name] and warned her that if she tests positive again for alcohol while being prescribed pain medication she could be discharged from clinic.  She says she had a drink because of pain.  I reinforced the danger of combining alcohol and opioid/opioid agonist medications. She understands.  A formal warning letter will be mailed.

## 2016-08-24 ENCOUNTER — Telehealth: Payer: Self-pay | Admitting: *Deleted

## 2016-08-24 ENCOUNTER — Telehealth: Payer: Self-pay | Admitting: Internal Medicine

## 2016-08-24 MED ORDER — FLUCONAZOLE 150 MG PO TABS: 150.0000 mg | ORAL_TABLET | Freq: Once | ORAL | 0 refills | Status: AC

## 2016-08-24 NOTE — Telephone Encounter (Signed)
I notified Ms Ibach.

## 2016-08-24 NOTE — Telephone Encounter (Signed)
-----   Message from Charlett Blake, MD sent at 08/23/2016  3:43 PM EDT ----- Positive EtOH, notify patient that would not be able to receive anything stronger than tramadol from this clinic

## 2016-08-24 NOTE — Telephone Encounter (Signed)
Sent - if no improvement she should see gyn

## 2016-08-24 NOTE — Telephone Encounter (Signed)
Spoke with pt to inform.  

## 2016-08-24 NOTE — Telephone Encounter (Signed)
Pt is having vaginal itching and discomfort, she is not having discharge and there is no smell, she is going to a wedding this weekend and would like something called in to soothe the discomfort.  Please advise.    [facility] [address]

## 2016-09-10 DIAGNOSIS — G894 Chronic pain syndrome: Secondary | ICD-10-CM | POA: Diagnosis not present

## 2016-09-10 DIAGNOSIS — M48061 Spinal stenosis, lumbar region without neurogenic claudication: Secondary | ICD-10-CM | POA: Diagnosis not present

## 2016-09-10 DIAGNOSIS — H8110 Benign paroxysmal vertigo, unspecified ear: Secondary | ICD-10-CM | POA: Diagnosis not present

## 2016-09-10 DIAGNOSIS — M545 Low back pain: Secondary | ICD-10-CM | POA: Diagnosis not present

## 2016-09-14 ENCOUNTER — Ambulatory Visit: Payer: Medicare Other | Admitting: Physical Medicine & Rehabilitation

## 2016-09-17 ENCOUNTER — Ambulatory Visit (INDEPENDENT_AMBULATORY_CARE_PROVIDER_SITE_OTHER): Payer: Medicare Other | Admitting: General Practice

## 2016-09-17 DIAGNOSIS — E538 Deficiency of other specified B group vitamins: Secondary | ICD-10-CM | POA: Diagnosis not present

## 2016-09-17 MED ORDER — CYANOCOBALAMIN 1000 MCG/ML IJ SOLN
1000.0000 ug | Freq: Once | INTRAMUSCULAR | Status: AC
  Administered 2016-09-17: 1000 ug via INTRAMUSCULAR

## 2016-09-17 NOTE — Progress Notes (Signed)
Injection given.   Fermon Ureta J Raylyn Carton, MD  

## 2016-10-02 ENCOUNTER — Ambulatory Visit (INDEPENDENT_AMBULATORY_CARE_PROVIDER_SITE_OTHER): Payer: Medicare Other | Admitting: Podiatry

## 2016-10-02 ENCOUNTER — Encounter: Payer: Self-pay | Admitting: Podiatry

## 2016-10-02 DIAGNOSIS — Q828 Other specified congenital malformations of skin: Secondary | ICD-10-CM | POA: Diagnosis not present

## 2016-10-02 DIAGNOSIS — M21969 Unspecified acquired deformity of unspecified lower leg: Secondary | ICD-10-CM

## 2016-10-02 NOTE — Progress Notes (Signed)
This patient presents the office with chief complaint of continued pain noted from the callus on both feet. She states that the callus becomes very painful walking and wearing her shoes. She says she has  had previous surgery in an attempt to eliminate her pain but her pain persists.  She now says that she presents the office routinely for treatment of her painful calluses on both feet   GENERAL APPEARANCE: Alert, conversant. Appropriately groomed. No acute distress.  VASCULAR: Pedal pulses are  palpable at  [facility] and PT bilateral.  Capillary refill time is immediate to all digits,  Normal temperature gradient.  Digital hair growth is present bilateral  NEUROLOGIC: sensation is normal to 5.07 monofilament at 5/5 sites bilateral.  Light touch is intact bilateral, Muscle strength normal.  MUSCULOSKELETAL: acceptable muscle strength, tone and stability bilateral.  Intrinsic muscluature intact bilateral.  Rectus appearance of foot and digits noted bilateral. Heloma durum 5th toe left foot.  Painful distal clavi second toe right foot.  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  DERMATOLOGIC: skin color, texture, and turgor are within normal limits.  No preulcerative lesions or ulcers  are seen, no interdigital maceration noted.  No open lesions present.  Digital nails are asymptomatic. No drainage noted.Porokeratosis sub 3 left, sub 4 right foot.  Pinch callus right foot.  Distal clavi second right.  Porokeratosis  B/l  Debridement of porokeratosis  B/L.  RTC 10 weeks.     Gardiner Barefoot DPM

## 2016-10-08 DIAGNOSIS — G894 Chronic pain syndrome: Secondary | ICD-10-CM | POA: Diagnosis not present

## 2016-10-08 DIAGNOSIS — M545 Low back pain: Secondary | ICD-10-CM | POA: Diagnosis not present

## 2016-10-16 ENCOUNTER — Encounter: Payer: Self-pay | Admitting: Internal Medicine

## 2016-10-16 ENCOUNTER — Ambulatory Visit (INDEPENDENT_AMBULATORY_CARE_PROVIDER_SITE_OTHER): Payer: Medicare Other | Admitting: Internal Medicine

## 2016-10-16 ENCOUNTER — Other Ambulatory Visit (INDEPENDENT_AMBULATORY_CARE_PROVIDER_SITE_OTHER): Payer: Medicare Other

## 2016-10-16 VITALS — BP 118/72 | HR 88 | Temp 97.8°F | Resp 18 | Wt 240.0 lb

## 2016-10-16 DIAGNOSIS — Z6838 Body mass index (BMI) 38.0-38.9, adult: Secondary | ICD-10-CM

## 2016-10-16 DIAGNOSIS — E538 Deficiency of other specified B group vitamins: Secondary | ICD-10-CM

## 2016-10-16 DIAGNOSIS — I10 Essential (primary) hypertension: Secondary | ICD-10-CM | POA: Diagnosis not present

## 2016-10-16 DIAGNOSIS — M48061 Spinal stenosis, lumbar region without neurogenic claudication: Secondary | ICD-10-CM

## 2016-10-16 DIAGNOSIS — G479 Sleep disorder, unspecified: Secondary | ICD-10-CM | POA: Diagnosis not present

## 2016-10-16 LAB — COMPREHENSIVE METABOLIC PANEL
ALT: 16 U/L (ref 0–35)
AST: 21 U/L (ref 0–37)
Albumin: 3.6 g/dL (ref 3.5–5.2)
Alkaline Phosphatase: 86 U/L (ref 39–117)
BUN: 27 mg/dL — ABNORMAL HIGH (ref 6–23)
CO2: 28 mEq/L (ref 19–32)
Calcium: 9.7 mg/dL (ref 8.4–10.5)
Chloride: 105 mEq/L (ref 96–112)
Creatinine, Ser: 1.06 mg/dL (ref 0.40–1.20)
GFR: 69.08 mL/min (ref >60.00)
Glucose, Bld: 113 mg/dL — ABNORMAL HIGH (ref 70–99)
Potassium: 4 mEq/L (ref 3.5–5.1)
Sodium: 139 mEq/L (ref 135–145)
Total Bilirubin: 0.7 mg/dL (ref 0.2–1.2)
Total Protein: 7.1 g/dL (ref 6.0–8.3)

## 2016-10-16 MED ORDER — CYANOCOBALAMIN 1000 MCG/ML IJ SOLN
1000.0000 ug | Freq: Once | INTRAMUSCULAR | Status: AC
  Administered 2016-10-16: 1000 ug via INTRAMUSCULAR

## 2016-10-16 NOTE — Progress Notes (Signed)
Subjective:    Patient ID: Misty [name], female    DOB: Oct 07, 1960, 56 y.o.   MRN: [id number]  HPI The patient is here for follow up.  Hypertension: She is taking her medication daily. She is compliant with a low sodium diet.  She denies chest pain, palpitations, edema, shortness of breath and regular headaches. She is exercising regularly - has been walking.      Chronic back pain:  She is following with pain management.  She is taking oxycodone and meloxicam.  Her pain is controlled.  She is trying to walk 15 minutes at a time.  She has been started on gabapentin 4 days ago.    B12 def:  She is s/p gastric bypass.  She is getting monthly B12 injections.  Obesity:  She has lost some weight.  She is walking for about 15 minutes most days for the past month and a half.  She is trying to eat more healthy.    Left leg pain :  It only happens at night in her left leg.  The leg feels tight.  She feels pain/pressure in the posterior left hip and leg.  The pain is intermittent every 20-30 minutes all night.  Last night it did not happen, but she just started gabapentin 4 days ago.    Difficulty sleeping:  She is sleeping her best from 6 am to 1pm because she is not able to fall asleep.  She just can not fall asleep until 4 or 6 am.  She has not tried melatonin.  She did not tolerate trazodone.     Medications and allergies reviewed with patient and updated if appropriate.  Patient Active Problem List   Diagnosis Date Noted  . Chronic right-sided low back pain without sciatica 07/20/2016  . Sacral mass 07/20/2016  . Osteoarthritis of right hip 07/20/2016  . Spinal stenosis of lumbar region 11/29/2015  . Positive urine drug screen 11/16/2015  . Chronic pain syndrome 10/12/2015  . Umbilical hernia 96/28/3662  . Sleep difficulties 07/15/2015  . Bunion, right 06/20/2015  . Callus of foot 04/27/2015  . Muscle spasm 04/16/2015  . B12 deficiency 03/30/2015  . Hammer toe of left foot  02/16/2015  . Fibrosis of skin of lower extremity 02/16/2015  . Status post right foot surgery 10/26/2014  . Cervical spondylosis with radiculopathy 03/01/2014  . Hammer toe of right foot 01/26/2014  . Benign intracranial hypertension 06/18/2013  . Pain in lower limb 05/22/2013  . Abnormal gait 04/21/2013  . Arthritis 04/21/2013  . Disturbance of skin sensation 04/21/2013  . Cephalalgia 04/21/2013  . Porokeratosis 03/31/2013  . Pain, foot 03/31/2013  . Vitamin D deficiency 04/18/2010  . Fatigue 04/14/2010  . Back pain with right-sided radiculopathy 11/29/2008  . MEDIAL MENISCUS TEAR, LEFT 06/30/2008  . BARRETTS ESOPHAGUS 06/28/2008  . OSTEOARTHRITIS, KNEES, BILATERAL 06/28/2008  . OBESITY 05/24/2008  . HYPERTENSION, BENIGN ESSENTIAL 05/24/2008  . IRRITABLE BOWEL SYNDROME 05/24/2008  . BARIATRIC SURGERY STATUS 03/19/2006  . NEOPLASM, MALIGNANT, COLON, HX OF 03/19/1997    Current Outpatient Prescriptions on File Prior to Visit  Medication Sig Dispense Refill  . aspirin 81 MG tablet Take 81 mg by mouth daily.    . Cholecalciferol (VITAMIN D) 2000 units CAPS Take 1 capsule (2,000 Units total) by mouth daily. 90 capsule 1  . Cyanocobalamin (VITAMIN B12) 3000 MCG/ML LIQD Place under the tongue.    . hydrochlorothiazide (MICROZIDE) 12.5 MG capsule Take 1 capsule (12.5 mg total) by mouth  daily. 90 capsule 1  . lisinopril (PRINIVIL,ZESTRIL) 20 MG tablet Take 1 tablet (20 mg total) by mouth daily. 90 tablet 1  . meloxicam (MOBIC) 7.5 MG tablet Take 7.5 mg by mouth daily.    . VENTOLIN HFA 108 (90 Base) MCG/ACT inhaler Inhale 1 puff into the lungs every 6 (six) hours as needed for wheezing or shortness of breath. 1 Inhaler 2  . vitamin B-6 (PYRIDOXINE) 25 MG tablet Take 25 mg by mouth daily.     No current facility-administered medications on file prior to visit.     Past Medical History:  Diagnosis Date  . Allergy   . Arthritis   . Back pain   . Colon cancer ([place])   . Depression     . Diarrhea   . GERD (gastroesophageal reflux disease)   . Hypertension   . Insomnia   . MS (multiple sclerosis) ([place])   . Other hammer toe (acquired) 03/31/2013  . Pneumonia    hx of  . Umbilical hernia     Past Surgical History:  Procedure Laterality Date  . ABDOMINAL HYSTERECTOMY    . ANTERIOR CERVICAL DECOMP/DISCECTOMY FUSION N/A 03/01/2014   Procedure: ANTERIOR CERVICAL DECOMPRESSION/DISCECTOMY FUSION 1 LEVEL;  Surgeon: Charlie Pitter, MD;  Location: [facility];  Service: Neurosurgery;  Laterality: N/A;  ANTERIOR CERVICAL DECOMPRESSION/DISCECTOMY FUSION 1 LEVEL  . BUNIONECTOMY Right 10/14/2014   @[facility]   . CESAREAN SECTION    . COLON SURGERY    . COLONOSCOPY    . FOOT SURGERY Right   . GASTRIC BYPASS    . Hammer Toe Repair Right 10/14/2014   RT #2, @[facility]   . TENOTOMY Right 10/14/2014   RT #3, @[facility]     Social History   Social History  . Marital status: Single    Spouse name: N/A  . Number of children: N/A  . Years of education: N/A   Social History Main Topics  . Smoking status: Current Some Day Smoker    Packs/day: 0.25    Years: 20.00    Types: Cigarettes  . Smokeless tobacco: Never Used  . Alcohol use 0.0 oz/week     Comment: 1/2 pint daily  . Drug use: No  . Sexual activity: Yes    Partners: Male   Other Topics Concern  . Not on file   Social History Narrative  . No narrative on file    Family History  Problem Relation Age of Onset  . Stroke Mother   . Hypertension Mother   . Hyperlipidemia Mother   . Diabetes Mother   . Diabetes Brother   . Hypertension Brother   . Hypertension Brother   . Hypertension Brother   . Hypertension Brother   . Hypertension Brother   . Alcoholism Brother     Review of Systems  Constitutional: Positive for fatigue. Negative for chills and fever.  Respiratory: Negative for cough, shortness of breath and wheezing.   Cardiovascular: Negative for chest pain, palpitations and leg swelling.  Musculoskeletal: Positive for  back pain and myalgias.  Neurological: Negative for light-headedness and headaches.  Psychiatric/Behavioral: Positive for sleep disturbance.       Objective:   Vitals:   10/16/16 1116  BP: 118/72  Pulse: 88  Resp: 18  Temp: 97.8 F (36.6 C)   Wt Readings from Last 3 Encounters:  10/16/16 240 lb (108.9 kg)  07/10/16 245 lb 9.6 oz (111.4 kg)  05/21/16 242 lb (109.8 kg)   Body mass index is 38.74 kg/m.  Physical Exam    Constitutional: Appears well-developed and well-nourished. No distress.  HENT:  Head: Normocephalic and atraumatic.  Neck: Neck supple. No tracheal deviation present. No thyromegaly present.  No cervical lymphadenopathy Cardiovascular: Normal rate, regular rhythm and normal heart sounds.   No murmur heard. No carotid bruit .  No edema Pulmonary/Chest: Effort normal and breath sounds normal. No respiratory distress. No has no wheezes. No rales.  Skin: Skin is warm and dry. Not diaphoretic.  Psychiatric: Normal mood and affect. Behavior is normal.      Assessment & Plan:    See Problem List for Assessment and Plan of chronic medical problems.

## 2016-10-16 NOTE — Assessment & Plan Note (Signed)
Did not tolerate trazodone Start melatonin at night Advised her to discuss with pain management increasing the gabapentin for her left leg pain and her sleep

## 2016-10-16 NOTE — Assessment & Plan Note (Signed)
Stressed continuing regular walking Decrease portions, healthy diet

## 2016-10-16 NOTE — Patient Instructions (Addendum)
Talk to pain management about increasing the gabapentin for your left leg pain and your sleep.   Trying taking 200 mg tonight.  Start melatonin 2-4 mg at night as well for your sleep.  B12 injection today.   [facility]  [address]  [place], [place] [address]  Main: [phone]    Test(s) ordered today. Your results will be released to [facility] (or called to you) after review, usually within 72hours after test completion. If any changes need to be made, you will be notified at that same time.   Medications reviewed and updated.  Try increasing gabapentin to 200 mg at night.   Please followup in 6 months

## 2016-10-16 NOTE — Assessment & Plan Note (Signed)
S/p gastric bypass B12 injection today

## 2016-10-16 NOTE — Assessment & Plan Note (Signed)
BP well controlled Current regimen effective and well tolerated Continue current medications at current doses cmp  

## 2016-10-16 NOTE — Assessment & Plan Note (Signed)
Following with pain management Advised discussing increasing gabapentin at night to 200 mg - may help with pain and sleep

## 2016-10-23 ENCOUNTER — Telehealth: Payer: Self-pay | Admitting: Internal Medicine

## 2016-10-23 DIAGNOSIS — M79602 Pain in left arm: Secondary | ICD-10-CM

## 2016-10-23 DIAGNOSIS — G479 Sleep disorder, unspecified: Secondary | ICD-10-CM

## 2016-10-23 DIAGNOSIS — M79605 Pain in left leg: Secondary | ICD-10-CM

## 2016-10-23 MED ORDER — LISINOPRIL 20 MG PO TABS: 20.0000 mg | ORAL_TABLET | Freq: Every day | ORAL | 1 refills | Status: DC

## 2016-10-23 MED ORDER — HYDROCHLOROTHIAZIDE 12.5 MG PO CAPS: 12.5000 mg | ORAL_CAPSULE | Freq: Every day | ORAL | 1 refills | Status: DC

## 2016-10-23 NOTE — Telephone Encounter (Signed)
Ok I just need a reminder for the reason for the referral

## 2016-10-23 NOTE — Telephone Encounter (Signed)
Patient is requesting refill on lisinopril and hydrochlorothiazide to be sent to [facility] on [address].  States that she is going out of town tomorrow and would like script to be sent today if possible.

## 2016-10-23 NOTE — Telephone Encounter (Signed)
Patient states that Dr. Quay Burow is going to refer her to a neurologist based off of her last OV.  Patient states Dr. Quay Burow was waiting for a provider name from patient.  Patient states she would like to be referred to Dr. Arlice Colt with [facility].

## 2016-10-25 NOTE — Telephone Encounter (Signed)
ordered

## 2016-10-25 NOTE — Telephone Encounter (Signed)
Referral is for Pain in left arm and left leg. Not sleeping at night.

## 2016-11-07 DIAGNOSIS — M545 Low back pain: Secondary | ICD-10-CM | POA: Diagnosis not present

## 2016-11-07 DIAGNOSIS — G894 Chronic pain syndrome: Secondary | ICD-10-CM | POA: Diagnosis not present

## 2016-11-12 ENCOUNTER — Other Ambulatory Visit: Payer: Self-pay | Admitting: Internal Medicine

## 2016-11-12 DIAGNOSIS — Z1231 Encounter for screening mammogram for malignant neoplasm of breast: Secondary | ICD-10-CM

## 2016-11-15 ENCOUNTER — Ambulatory Visit: Payer: Medicare Other

## 2016-11-16 ENCOUNTER — Ambulatory Visit: Payer: Medicare Other

## 2016-11-20 ENCOUNTER — Ambulatory Visit (INDEPENDENT_AMBULATORY_CARE_PROVIDER_SITE_OTHER): Payer: Medicare Other | Admitting: General Practice

## 2016-11-20 DIAGNOSIS — E538 Deficiency of other specified B group vitamins: Secondary | ICD-10-CM

## 2016-11-20 MED ORDER — CYANOCOBALAMIN 1000 MCG/ML IJ SOLN
1000.0000 ug | Freq: Once | INTRAMUSCULAR | Status: AC
  Administered 2016-11-20: 1000 ug via INTRAMUSCULAR

## 2016-11-20 NOTE — Progress Notes (Signed)
Injection given.   Chele Cornell J Hance Caspers, MD  

## 2016-11-21 ENCOUNTER — Ambulatory Visit: Payer: Medicare Other | Admitting: Internal Medicine

## 2016-12-07 DIAGNOSIS — G894 Chronic pain syndrome: Secondary | ICD-10-CM | POA: Diagnosis not present

## 2016-12-07 DIAGNOSIS — M545 Low back pain: Secondary | ICD-10-CM | POA: Diagnosis not present

## 2016-12-11 ENCOUNTER — Ambulatory Visit: Payer: Medicare Other | Admitting: Podiatry

## 2016-12-13 NOTE — Progress Notes (Signed)
Subjective:    Patient ID: Misty [name], female    DOB: [birth year], 56 y.o.   MRN: [id number]  HPI She is here for an acute visit for cold symptoms.   Cold symptoms:  Her symptoms started 3-4 days ago.  She is experiencing sore throat, fever, nasal congestion, sinus pressure, PND, cough that was more productive yesterday.  Her sore throat and fever has resolved.  She feels better compared to yesterday.  She has tried taking mucinex dm, cough syrup and sore throat lozenges.  Left shoulder cyst : she has a cyst on her left shoulder that has a blackhead.   When she squeezed it and it seemed to get larger. She denies any redness or discharge. She was concerned and wanted to know what she could do about this.  One month ago she was using a heating pad and there is no cover. She burned herself on her back and on her right breast. The back is finally healing, but the area on her right breast is not healing and has been a month. She did apply antibiotic  Ointment and use  Hydrogen peroxide.At one pot there was discoloration and green discharge , but that has resolved   chronic lower back pain: she is following with pain management. She is frustrated because they have referred her to physical therapy,but she has not heard anything about the referral. She is eager to start physical therapy because she feels that will help.  Hypertension: She is taking her medication daily. She is compliant with a low sodium diet.  She denies chest pain, palpitations, edema, shortness of breath and regular headaches. She is not exercising regularly.  She does  monitor her blood pressure at home and it is low normal.     Medications and allergies reviewed with patient and updated if appropriate.  Patient Active Problem List   Diagnosis Date Noted  . Chronic right-sided low back pain without sciatica 07/20/2016  . Sacral mass 07/20/2016  . Osteoarthritis of right hip 07/20/2016  . Spinal stenosis of lumbar  region 11/29/2015  . Positive urine drug screen 11/16/2015  . Chronic pain syndrome 10/12/2015  . Umbilical hernia 56/21/3086  . Sleep difficulties 07/15/2015  . Bunion, right 06/20/2015  . Callus of foot 04/27/2015  . Muscle spasm 04/16/2015  . B12 deficiency 03/30/2015  . Hammer toe of left foot 02/16/2015  . Fibrosis of skin of lower extremity 02/16/2015  . Status post right foot surgery 10/26/2014  . Cervical spondylosis with radiculopathy 03/01/2014  . Hammer toe of right foot 01/26/2014  . Benign intracranial hypertension 06/18/2013  . Pain in lower limb 05/22/2013  . Abnormal gait 04/21/2013  . Arthritis 04/21/2013  . Disturbance of skin sensation 04/21/2013  . Cephalalgia 04/21/2013  . Porokeratosis 03/31/2013  . Pain, foot 03/31/2013  . Vitamin D deficiency 04/18/2010  . Fatigue 04/14/2010  . Back pain with right-sided radiculopathy 11/29/2008  . MEDIAL MENISCUS TEAR, LEFT 06/30/2008  . BARRETTS ESOPHAGUS 06/28/2008  . OSTEOARTHRITIS, KNEES, BILATERAL 06/28/2008  . OBESITY 05/24/2008  . HYPERTENSION, BENIGN ESSENTIAL 05/24/2008  . IRRITABLE BOWEL SYNDROME 05/24/2008  . BARIATRIC SURGERY STATUS 03/19/2006  . NEOPLASM, MALIGNANT, COLON, HX OF 03/19/1997    Current Outpatient Prescriptions on File Prior to Visit  Medication Sig Dispense Refill  . aspirin 81 MG tablet Take 81 mg by mouth daily.    . Cholecalciferol (VITAMIN D) 2000 units CAPS Take 1 capsule (2,000 Units total) by mouth daily. 90 capsule 1  .  Cyanocobalamin (VITAMIN B12) 3000 MCG/ML LIQD Place under the tongue.    . gabapentin (NEURONTIN) 100 MG capsule Take 100 mg by mouth daily.    . hydrochlorothiazide (MICROZIDE) 12.5 MG capsule Take 1 capsule (12.5 mg total) by mouth daily. 90 capsule 1  . lisinopril (PRINIVIL,ZESTRIL) 20 MG tablet Take 1 tablet (20 mg total) by mouth daily. 90 tablet 1  . meloxicam (MOBIC) 7.5 MG tablet Take 7.5 mg by mouth daily.    . Oxycodone HCl 10 MG TABS Take by mouth.      . VENTOLIN HFA 108 (90 Base) MCG/ACT inhaler Inhale 1 puff into the lungs every 6 (six) hours as needed for wheezing or shortness of breath. 1 Inhaler 2  . vitamin B-6 (PYRIDOXINE) 25 MG tablet Take 25 mg by mouth daily.     No current facility-administered medications on file prior to visit.     Past Medical History:  Diagnosis Date  . Allergy   . Arthritis   . Back pain   . Colon cancer ([place])   . Depression   . Diarrhea   . GERD (gastroesophageal reflux disease)   . Hypertension   . Insomnia   . MS (multiple sclerosis) ([place])   . Other hammer toe (acquired) 03/31/2013  . Pneumonia    hx of  . Umbilical hernia     Past Surgical History:  Procedure Laterality Date  . ABDOMINAL HYSTERECTOMY    . ANTERIOR CERVICAL DECOMP/DISCECTOMY FUSION N/A 03/01/2014   Procedure: ANTERIOR CERVICAL DECOMPRESSION/DISCECTOMY FUSION 1 LEVEL;  Surgeon: Charlie Pitter, MD;  Location: [facility];  Service: Neurosurgery;  Laterality: N/A;  ANTERIOR CERVICAL DECOMPRESSION/DISCECTOMY FUSION 1 LEVEL  . BUNIONECTOMY Right 10/14/2014   @[facility]   . CESAREAN SECTION    . COLON SURGERY    . COLONOSCOPY    . FOOT SURGERY Right   . GASTRIC BYPASS    . Hammer Toe Repair Right 10/14/2014   RT #2, @[facility]   . TENOTOMY Right 10/14/2014   RT #3, @[facility]     Social History   Social History  . Marital status: Single    Spouse name: N/A  . Number of children: N/A  . Years of education: N/A   Social History Main Topics  . Smoking status: Current Some Day Smoker    Packs/day: 0.25    Years: 20.00    Types: Cigarettes  . Smokeless tobacco: Never Used  . Alcohol use 0.0 oz/week     Comment: 1/2 pint daily  . Drug use: No  . Sexual activity: Yes    Partners: Male   Other Topics Concern  . Not on file   Social History Narrative  . No narrative on file    Family History  Problem Relation Age of Onset  . Stroke Mother   . Hypertension Mother   . Hyperlipidemia Mother   . Diabetes Mother   . Diabetes  Brother   . Hypertension Brother   . Hypertension Brother   . Hypertension Brother   . Hypertension Brother   . Hypertension Brother   . Alcoholism Brother     Review of Systems  Constitutional: Positive for fever (none today).  HENT: Positive for congestion, postnasal drip, sinus pressure and sore throat (resolved today). Negative for ear pain.   Respiratory: Positive for cough (better than yesterday). Negative for shortness of breath and wheezing.   Cardiovascular: Negative for chest pain, palpitations and leg swelling.  Gastrointestinal: Negative for diarrhea and nausea.  Neurological: Negative for light-headedness  and headaches.       Objective:   Vitals:   12/14/16 1106  BP: 106/64  Pulse: (!) 101  Resp: 16  Temp: 97.9 F (36.6 C)  SpO2: 98%   Filed Weights   12/14/16 1106  Weight: 236 lb (107 kg)   Body mass index is 38.09 kg/m.  Wt Readings from Last 3 Encounters:  12/14/16 236 lb (107 kg)  10/16/16 240 lb (108.9 kg)  07/10/16 245 lb 9.6 oz (111.4 kg)     Physical Exam GENERAL APPEARANCE: Appears stated age, well appearing, NAD EYES: conjunctiva clear, no icterus HEENT: bilateral tympanic membranes and ear canals normal, oropharynx with no erythema, no thyromegaly, trachea midline, no cervical or supraclavicular lymphadenopathy LUNGS: Clear to auscultation without wheeze or crackles, unlabored breathing, good air entry bilaterally HEART/VASC: Normal S1,S2 without murmurs. No edema SKIN: pea sized cyst on posterior left shoulder - non tender, no open wound or discharge, no redness; 3 cm x 1.5 cm healing burn on central lower back w/o discharge or redness; 2 cm x 1 cm burn on medial right breast with no redness or discharge - moist appearance, no pus EXTREMITIES: Without clubbing, cyanosis        Assessment & Plan:   See Problem List for Assessment and Plan of chronic medical problems.

## 2016-12-14 ENCOUNTER — Ambulatory Visit (INDEPENDENT_AMBULATORY_CARE_PROVIDER_SITE_OTHER): Payer: Medicare Other | Admitting: Podiatry

## 2016-12-14 ENCOUNTER — Ambulatory Visit (INDEPENDENT_AMBULATORY_CARE_PROVIDER_SITE_OTHER): Payer: Medicare Other | Admitting: Internal Medicine

## 2016-12-14 ENCOUNTER — Encounter: Payer: Self-pay | Admitting: Podiatry

## 2016-12-14 ENCOUNTER — Encounter: Payer: Self-pay | Admitting: Internal Medicine

## 2016-12-14 VITALS — BP 106/64 | HR 101 | Temp 97.9°F | Resp 16 | Wt 236.0 lb

## 2016-12-14 DIAGNOSIS — X16XXXA Contact with hot heating appliances, radiators and pipes, initial encounter: Secondary | ICD-10-CM

## 2016-12-14 DIAGNOSIS — I10 Essential (primary) hypertension: Secondary | ICD-10-CM | POA: Diagnosis not present

## 2016-12-14 DIAGNOSIS — E538 Deficiency of other specified B group vitamins: Secondary | ICD-10-CM

## 2016-12-14 DIAGNOSIS — M79674 Pain in right toe(s): Secondary | ICD-10-CM

## 2016-12-14 DIAGNOSIS — B351 Tinea unguium: Secondary | ICD-10-CM

## 2016-12-14 DIAGNOSIS — T300 Burn of unspecified body region, unspecified degree: Secondary | ICD-10-CM

## 2016-12-14 DIAGNOSIS — J069 Acute upper respiratory infection, unspecified: Secondary | ICD-10-CM | POA: Insufficient documentation

## 2016-12-14 DIAGNOSIS — Q828 Other specified congenital malformations of skin: Secondary | ICD-10-CM | POA: Diagnosis not present

## 2016-12-14 DIAGNOSIS — M545 Low back pain: Secondary | ICD-10-CM

## 2016-12-14 DIAGNOSIS — M79675 Pain in left toe(s): Secondary | ICD-10-CM

## 2016-12-14 DIAGNOSIS — G8929 Other chronic pain: Secondary | ICD-10-CM | POA: Diagnosis not present

## 2016-12-14 MED ORDER — SILVER SULFADIAZINE 1 % EX CREA: 1.0000 "application " | TOPICAL_CREAM | Freq: Every day | CUTANEOUS | 0 refills | Status: DC

## 2016-12-14 MED ORDER — CYANOCOBALAMIN 1000 MCG/ML IJ SOLN
1000.0000 ug | Freq: Once | INTRAMUSCULAR | Status: AC
  Administered 2016-12-14: 1000 ug via INTRAMUSCULAR

## 2016-12-14 NOTE — Assessment & Plan Note (Signed)
B12 injection today - continue monthly

## 2016-12-14 NOTE — Assessment & Plan Note (Signed)
Two small Misty [name] that occurred one month ago from a heating pad One on back - healing well One on right breast that is not healing as well, but there is no infection - stop H2O2 and start applying silvadene daily Monitor for infection - call if no improvement

## 2016-12-14 NOTE — Patient Instructions (Addendum)
  B12 injection given today.   Medications reviewed and updated.  Changes include stopping the hctz and monitoring your BP at home.  Call if it is elevated.    Please followup in 6 months

## 2016-12-14 NOTE — Assessment & Plan Note (Addendum)
Well controlled - has been low normal at home D/c hctz Continue lisinopril Monitor bp at home

## 2016-12-14 NOTE — Assessment & Plan Note (Signed)
Following with pain management Will refer to PT

## 2016-12-14 NOTE — Progress Notes (Signed)
This patient presents the office with chief complaint of continued pain noted from the callus on both feet. She states that the callus becomes very painful walking and wearing her shoes. She says she has  had previous surgery in an attempt to eliminate her pain but her pain persists.  She now says that she presents the office routinely for treatment of her painful calluses on both feet   GENERAL APPEARANCE: Alert, conversant. Appropriately groomed. No acute distress.  VASCULAR: Pedal pulses are  palpable at  [facility] and PT bilateral.  Capillary refill time is immediate to all digits,  Normal temperature gradient.   NEUROLOGIC: sensation is normal to 5.07 monofilament at 5/5 sites bilateral.  Light touch is intact bilateral, Muscle strength normal.  MUSCULOSKELETAL: acceptable muscle strength, tone and stability bilateral.  Intrinsic muscluature intact bilateral.  Rectus appearance of foot and digits noted bilateral. Heloma durum 5th toe left foot.  Painful distal clavi second toe right foot.  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  DERMATOLOGIC: skin color, texture, and turgor are within normal limits.  No preulcerative lesions or ulcers  are seen, no interdigital maceration noted.  No open lesions present.  Digital nails are asymptomatic. No drainage noted.Porokeratosis sub 3,5 right  foot and 4 left foot. Pinch callus right foot.  Distal clavi second right.  Porokeratosis  B/l  Onychomycosis  B/L  Debridement of porokeratosis  B/L.  Debridement of onychomycosis  B/L.   RTC 10 weeks.     Gardiner Barefoot DPM

## 2016-12-14 NOTE — Assessment & Plan Note (Addendum)
Likely viral in nature improving with symptomatic treatment Continue over-the-counter cold medications Call if no improvement

## 2016-12-17 NOTE — Progress Notes (Deleted)
Pre visit review using our clinic review tool, if applicable. No additional management support is needed unless otherwise documented below in the visit note. 

## 2016-12-17 NOTE — Progress Notes (Deleted)
Subjective:   Misty [name] is a 56 y.o. female who presents for an Initial Medicare Annual Wellness Visit.  Review of Systems    No ROS.  Medicare Wellness Visit. Additional risk factors are reflected in the social history.     Sleep patterns: {SX; SLEEP PATTERNS:18802::"feels rested on waking","does not get up to void","gets up *** times nightly to void","sleeps *** hours nightly"}.    Home Safety/Smoke Alarms: Feels safe in home. Smoke alarms in place.  Living environment; residence and Firearm Safety: {Rehab home environment / accessibility:30080::"no firearms","firearms stored safely"}. Seat Belt Safety/Bike Helmet: Wears seat belt.     Objective:    There were no vitals filed for this visit. There is no height or weight on file to calculate BMI.   Current Medications (verified) Outpatient Encounter Prescriptions as of 12/18/2016  Medication Sig  . aspirin 81 MG tablet Take 81 mg by mouth daily.  . Cholecalciferol (VITAMIN D) 2000 units CAPS Take 1 capsule (2,000 Units total) by mouth daily.  . Cyanocobalamin (VITAMIN B12) 3000 MCG/ML LIQD Place under the tongue.  . gabapentin (NEURONTIN) 100 MG capsule Take 100 mg by mouth daily.  Marland Kitchen lisinopril (PRINIVIL,ZESTRIL) 20 MG tablet Take 1 tablet (20 mg total) by mouth daily.  . meloxicam (MOBIC) 7.5 MG tablet Take 7.5 mg by mouth daily.  . Oxycodone HCl 10 MG TABS Take by mouth.  . silver sulfADIAZINE (SILVADENE) 1 % cream Apply 1 application topically daily.  . VENTOLIN HFA 108 (90 Base) MCG/ACT inhaler Inhale 1 puff into the lungs every 6 (six) hours as needed for wheezing or shortness of breath.  . vitamin B-6 (PYRIDOXINE) 25 MG tablet Take 25 mg by mouth daily.   No facility-administered encounter medications on file as of 12/18/2016.     Allergies (verified) Phenergan [promethazine hcl]; Promethazine; and Trazodone and nefazodone   History: Past Medical History:  Diagnosis Date  . Allergy   . Arthritis   .  Back pain   . Colon cancer (Misty [name])   . Depression   . Diarrhea   . GERD (gastroesophageal reflux disease)   . Hypertension   . Insomnia   . MS (multiple sclerosis) (Misty [name])   . Other hammer toe (acquired) 03/31/2013  . Pneumonia    hx of  . Umbilical hernia    Past Surgical History:  Procedure Laterality Date  . ABDOMINAL HYSTERECTOMY    . ANTERIOR CERVICAL DECOMP/DISCECTOMY FUSION N/A 03/01/2014   Procedure: ANTERIOR CERVICAL DECOMPRESSION/DISCECTOMY FUSION 1 LEVEL;  Surgeon: Charlie Pitter, MD;  Location: [facility];  Service: Neurosurgery;  Laterality: N/A;  ANTERIOR CERVICAL DECOMPRESSION/DISCECTOMY FUSION 1 LEVEL  . BUNIONECTOMY Right 10/14/2014   @[facility]   . CESAREAN SECTION    . COLON SURGERY    . COLONOSCOPY    . FOOT SURGERY Right   . GASTRIC BYPASS    . Hammer Toe Repair Right 10/14/2014   RT #2, @[facility]   . TENOTOMY Right 10/14/2014   RT #3, @[facility]    Family History  Problem Relation Age of Onset  . Stroke Mother   . Hypertension Mother   . Hyperlipidemia Mother   . Diabetes Mother   . Diabetes Brother   . Hypertension Brother   . Hypertension Brother   . Hypertension Brother   . Hypertension Brother   . Hypertension Brother   . Alcoholism Brother    Social History   Occupational History  . Not on file.   Social History Main Topics  . Smoking status:  Current Some Day Smoker    Packs/day: 0.25    Years: 20.00    Types: Cigarettes  . Smokeless tobacco: Never Used  . Alcohol use 0.0 oz/week     Comment: 1/2 pint daily  . Drug use: No  . Sexual activity: Yes    Partners: Male    Tobacco Counseling Ready to quit: Not Answered Counseling given: Not Answered   Activities of Daily Living No flowsheet data found.  Immunizations and Health Maintenance Immunization History  Administered Date(s) Administered  . Influenza Whole 04/14/2010  . Influenza,inj,Quad PF,6+ Mos 03/01/2014, 01/18/2016  . Influenza-Unspecified 03/16/2015   Health Maintenance Due  Topic  Date Due  . TETANUS/TDAP  04/10/1980  . PAP SMEAR  04/10/1982  . MAMMOGRAM  08/10/2016    Patient Care Team: [name], MD as PCP - General (Internal Medicine)  Indicate any recent Medical Services you may have received from other than [facility] providers in the past year (date may be approximate).     Assessment:   This is a routine wellness examination for Misty [name]. Physical assessment deferred to PCP.   Hearing/Vision screen No exam data present  Dietary issues and exercise activities discussed:   Diet (meal preparation, eat out, water intake, caffeinated beverages, dairy products, fruits and vegetables): {Desc; diets:16563}   Goals    None     Depression Screen PHQ 2/9 Scores 07/10/2016 06/22/2016  PHQ - 2 Score 0 1  PHQ- 9 Score - 6    Fall Risk Fall Risk  06/22/2016 10/11/2015  Falls in the past year? Yes Yes  Number falls in past yr: 2 or more 2 or more  Injury with Fall? Yes No  Comment shoulder dislocated -  Risk Factor Category  High Fall Risk -  Risk for fall due to : Impaired balance/gait -  Follow up Falls prevention discussed;Education provided Falls evaluation completed    Cognitive Function:        Screening Tests Health Maintenance  Topic Date Due  . TETANUS/TDAP  04/10/1980  . PAP SMEAR  04/10/1982  . MAMMOGRAM  08/10/2016  . INFLUENZA VACCINE  06/17/2017 (Originally 10/17/2016)  . COLONOSCOPY  09/08/2024  . Hepatitis C Screening  Completed  . HIV Screening  Completed      Plan:     I have personally reviewed and noted the following in the patient's chart:   . Medical and social history . Use of alcohol, tobacco or illicit drugs  . Current medications and supplements . Functional ability and status . Nutritional status . Physical activity . Advanced directives . List of other physicians . Vitals . Screenings to include cognitive, depression, and falls . Referrals and appointments  In addition, I have reviewed and discussed with  patient certain preventive protocols, quality metrics, and best practice recommendations. A written personalized care plan for preventive services as well as general preventive health recommendations were provided to patient.     Michiel Cowboy, RN   12/17/2016

## 2016-12-18 ENCOUNTER — Encounter: Payer: Self-pay | Admitting: Neurology

## 2016-12-18 ENCOUNTER — Ambulatory Visit (INDEPENDENT_AMBULATORY_CARE_PROVIDER_SITE_OTHER): Payer: Medicare Other | Admitting: Neurology

## 2016-12-18 ENCOUNTER — Ambulatory Visit: Payer: Medicare Other | Attending: Internal Medicine

## 2016-12-18 ENCOUNTER — Ambulatory Visit: Payer: Medicare Other

## 2016-12-18 VITALS — BP 114/76 | HR 83 | Resp 20 | Ht 66.0 in | Wt 239.5 lb

## 2016-12-18 DIAGNOSIS — G8929 Other chronic pain: Secondary | ICD-10-CM | POA: Diagnosis not present

## 2016-12-18 DIAGNOSIS — M542 Cervicalgia: Secondary | ICD-10-CM

## 2016-12-18 DIAGNOSIS — M79602 Pain in left arm: Secondary | ICD-10-CM

## 2016-12-18 DIAGNOSIS — M79601 Pain in right arm: Secondary | ICD-10-CM

## 2016-12-18 DIAGNOSIS — M4722 Other spondylosis with radiculopathy, cervical region: Secondary | ICD-10-CM | POA: Diagnosis not present

## 2016-12-18 DIAGNOSIS — M62838 Other muscle spasm: Secondary | ICD-10-CM | POA: Diagnosis not present

## 2016-12-18 DIAGNOSIS — G479 Sleep disorder, unspecified: Secondary | ICD-10-CM | POA: Diagnosis not present

## 2016-12-18 DIAGNOSIS — R209 Unspecified disturbances of skin sensation: Secondary | ICD-10-CM

## 2016-12-18 DIAGNOSIS — M62830 Muscle spasm of back: Secondary | ICD-10-CM

## 2016-12-18 DIAGNOSIS — R269 Unspecified abnormalities of gait and mobility: Secondary | ICD-10-CM

## 2016-12-18 DIAGNOSIS — M6281 Muscle weakness (generalized): Secondary | ICD-10-CM | POA: Insufficient documentation

## 2016-12-18 DIAGNOSIS — R293 Abnormal posture: Secondary | ICD-10-CM

## 2016-12-18 DIAGNOSIS — M545 Low back pain: Secondary | ICD-10-CM | POA: Diagnosis not present

## 2016-12-18 DIAGNOSIS — M48061 Spinal stenosis, lumbar region without neurogenic claudication: Secondary | ICD-10-CM | POA: Diagnosis not present

## 2016-12-18 DIAGNOSIS — G894 Chronic pain syndrome: Secondary | ICD-10-CM | POA: Diagnosis not present

## 2016-12-18 MED ORDER — GABAPENTIN 300 MG PO CAPS: ORAL_CAPSULE | ORAL | 5 refills | Status: DC

## 2016-12-18 NOTE — Progress Notes (Signed)
[facility]  PATIENT: Misty [name] DOB: Nov 22, 1960  REFERRING DOCTOR OR PCP:  Billey Gosling SOURCE: Patient, imaging and lab results, MRI images on PACS, notes from primary care and neurology  _________________________________   HISTORICAL  CHIEF COMPLAINT:  Chief Complaint  Patient presents with  . Left Arm Pain    Sts. she was dx. with MS 10 yrs. by Dr. Fayrene Helper here at [facility]. Was on Betaseron, but stopped due to elevated liver enzymes.  Then was on Copaxone.  Sts. in 2015, she saw a neurologist at [facility] who told her she did not have MS, and stopped her Copaxone. Since then, she c/o mult. joint pain, spasms.[place]  . Extremity Weakness  . Bilateral Leg Pain    HISTORY OF PRESENT ILLNESS:  I had the pleasure seeing you patient, Misty [name], at [facility] for neurologic consultation regarding her possible diagnosis of multiple sclerosis and pain syndromes.  Around 2008, she was noting difficulty with balance and gait.   She saw Dr. Jacolyn Reedy who Ordered various studies including MRIs and diagnosed MS.     She was started on Betaseron but had trouble with elevated liver function tests and shifted to Copaxone.   In 2016, she was referred to Dr. George Hugh and was told she likely did not have MS and the Copaxone was discontinued.    MRIs of the brain showed nonspecific foci in the subcortical and deep white matter.  Her main symptoms are pain related.    She notes muscle spasms in her arms and hands, mostly on the left.  Pain radiates down the arm, left worse than right.  There are dysesthetic sensations in her arms.    She has milder leg spasms/myalgias.     She had spine surgery at [facility] (Dr. Annette Stable) in December, 2015.  Unfortunately it did not help her pain any.   She has noted more burning pain down the arm since then.      She also notes pain in her lower back that radiates into her legs.   Standing and walking worsens the pain.    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   She feels her gait is worse and she stoops forward.    Of note, she has moderate spinal stenosis at L3-L4 and L4-L5.   She had multiple ESI's for the pain without much benefit.   She is on oxycodone but it makes her sleepy.   She has been on baclofen but it has not helped her pain.    Pain did better on gabapentin (4 yellow capsules a day, she thinks).     She has no difficulty with bladder function or frequency.    She notes mild blurry vision, better with glasses.    She has fatigue daily.    Vascular risks:   She has hypertension and a history of smoking.   No DM.    No cardiac disease.     I personally reviewed the MRIs of the brain dated 12/22/2012, 08/20/2013 and 08/03/2014. All of them show a stable pattern of T2/FLAIR hyperintense foci predominantly in the subcortical and deep white matter. She does not have radially oriented lesions in the septal callosal white matter nor are there U-fiber lesions and no foci in the infratentorial white matter. This is a nonspecific pattern that is most consistent with chronic microvascular ischemic change.    MRI of the lumbar spine 08/01/2016 shows multilevel degenerative changes with moderate  spinal stenosis at L3-L4 and L4-L5. There is multilevel foraminal narrowing but no definite nerve root compression. There is also a small L2 superior endplate compression fracture that appeared chronic.   MRI of the cervical spine 12/18/2013 showed a normal spinal cord. There is disc bulging at C5-C6 with possible left C6 nerve root compression.   REVIEW OF SYSTEMS: Constitutional: No fevers, chills, sweats, or change in appetite.  She reports fatigue and insomnia. Eyes: No visual changes, double vision, eye pain Ear, nose and throat: No hearing loss, ear pain, nasal congestion, sore throat Cardiovascular: No chest pain, palpitations Respiratory: No shortness of breath at rest or with exertion.   No wheezes GastrointestinaI: No nausea,  vomiting, diarrhea, abdominal pain, fecal incontinence Genitourinary: No dysuria, urinary retention or frequency.  No nocturia. Musculoskeletal: as above Integumentary: No rash, pruritus, skin lesions Neurological: as above Psychiatric: No depression at this time.  No anxiety Endocrine: No palpitations, diaphoresis, change in appetite, change in weigh or increased thirst Hematologic/Lymphatic: No anemia, purpura, petechiae. Allergic/Immunologic: No itchy/runny eyes, nasal congestion, recent allergic reactions, rashes  ALLERGIES: Allergies  Allergen Reactions  . Phenergan [Promethazine Hcl] Anxiety  . Promethazine Anxiety  . Trazodone And Nefazodone Rash    HOME MEDICATIONS:  Current Outpatient Prescriptions:  .  aspirin 81 MG tablet, Take 81 mg by mouth daily., Disp: , Rfl:  .  Cholecalciferol (VITAMIN D) 2000 units CAPS, Take 1 capsule (2,000 Units total) by mouth daily., Disp: 90 capsule, Rfl: 1 .  Cyanocobalamin (VITAMIN B12) 3000 MCG/ML LIQD, Place under the tongue., Disp: , Rfl:  .  gabapentin (NEURONTIN) 100 MG capsule, Take 100 mg by mouth daily., Disp: , Rfl:  .  lisinopril (PRINIVIL,ZESTRIL) 20 MG tablet, Take 1 tablet (20 mg total) by mouth daily., Disp: 90 tablet, Rfl: 1 .  meloxicam (MOBIC) 7.5 MG tablet, Take 7.5 mg by mouth daily., Disp: , Rfl:  .  Oxycodone HCl 10 MG TABS, Take by mouth., Disp: , Rfl:  .  silver sulfADIAZINE (SILVADENE) 1 % cream, Apply 1 application topically daily., Disp: 25 g, Rfl: 0 .  VENTOLIN HFA 108 (90 Base) MCG/ACT inhaler, Inhale 1 puff into the lungs every 6 (six) hours as needed for wheezing or shortness of breath., Disp: 1 Inhaler, Rfl: 2 .  vitamin B-6 (PYRIDOXINE) 25 MG tablet, Take 25 mg by mouth daily., Disp: , Rfl:   PAST MEDICAL HISTORY: Past Medical History:  Diagnosis Date  . Allergy   . Arthritis   . Back pain   . Colon cancer ([place])   . Depression   . Diarrhea   . GERD (gastroesophageal reflux disease)   . Hypertension    . Insomnia   . MS (multiple sclerosis) ([place])   . Other hammer toe (acquired) 03/31/2013  . Pneumonia    hx of  . Umbilical hernia     PAST SURGICAL HISTORY: Past Surgical History:  Procedure Laterality Date  . ABDOMINAL HYSTERECTOMY    . ANTERIOR CERVICAL DECOMP/DISCECTOMY FUSION N/A 03/01/2014   Procedure: ANTERIOR CERVICAL DECOMPRESSION/DISCECTOMY FUSION 1 LEVEL;  Surgeon: Charlie Pitter, MD;  Location: [facility];  Service: Neurosurgery;  Laterality: N/A;  ANTERIOR CERVICAL DECOMPRESSION/DISCECTOMY FUSION 1 LEVEL  . BUNIONECTOMY Right 10/14/2014   @[facility]   . CESAREAN SECTION    . COLON SURGERY    . COLONOSCOPY    . FOOT SURGERY Right   . GASTRIC BYPASS    . Hammer Toe Repair Right 10/14/2014   RT #2, @[facility]   . TENOTOMY  Right 10/14/2014   RT #3, @[facility]     FAMILY HISTORY: Family History  Problem Relation Age of Onset  . Stroke Mother   . Hypertension Mother   . Hyperlipidemia Mother   . Diabetes Mother   . Diabetes Brother   . Hypertension Brother   . Hypertension Brother   . Hypertension Brother   . Hypertension Brother   . Hypertension Brother   . Alcoholism Brother     SOCIAL HISTORY:  Social History   Social History  . Marital status: Single    Spouse name: N/A  . Number of children: N/A  . Years of education: N/A   Occupational History  . Not on file.   Social History Main Topics  . Smoking status: Current Some Day Smoker    Packs/day: 0.25    Years: 20.00    Types: Cigarettes  . Smokeless tobacco: Never Used  . Alcohol use 0.0 oz/week     Comment: 1/2 pint daily  . Drug use: No  . Sexual activity: Yes    Partners: Male   Other Topics Concern  . Not on file   Social History Narrative  . No narrative on file     PHYSICAL EXAM  Vitals:   12/18/16 1043  BP: 114/76  Pulse: 83  Resp: 20  Weight: 239 lb 8 oz (108.6 kg)  Height: 5\' 6"  (1.676 m)    Body mass index is 38.66 kg/m.   General: The patient is well-developed and  well-nourished and in no acute distress   Neck: The neck is supple, no carotid bruits are noted.  The neck is nontender.  Cardiovascular: The heart has a regular rate and rhythm with a normal S1 and S2. There were no murmurs, gallops or rubs. Lungs are clear to auscultation.  Skin: Extremities are without significant edema.  Musculoskeletal:  Back is nontender  Neurologic Exam  Mental status: The patient is alert and oriented x 3 at the time of the examination. The patient has apparent normal recent and remote memory, with an apparently normal attention span and concentration ability.   Speech is normal.  Cranial nerves: Extraocular movements are full.   Facial symmetry is present. There is good facial sensation to soft touch bilaterally.Facial strength is normal.  Trapezius and sternocleidomastoid strength is normal. No dysarthria is noted.  The tongue is midline, and the patient has symmetric elevation of the soft palate. No obvious hearing deficits are noted.  Motor:  Muscle bulk is normal.   Tone is normal. Strength is  5 / 5 in all 4 extremities.   Sensory: She has reduced vibration sensation in the left hand but symmetric touch/temperature and symmetric normal sensation in her legs.   Coordination: Cerebellar testing reveals good finger-nose-finger and heel-to-shin bilaterally.  Gait and station: Station is normal.   Gait is arthritic and stooped and tandem walk is wide. Romberg is negative.   Reflexes: Deep tendon reflexes are symmetric and normal bilaterally.   Plantar responses are flexor.    DIAGNOSTIC DATA (LABS, IMAGING, TESTING) - I reviewed patient records, labs, notes, testing and imaging myself where available.  Lab Results  Component Value Date   WBC 5.4 10/05/2015   HGB 12.0 10/05/2015   HCT 36.5 10/05/2015   MCV 93.1 10/05/2015   PLT 190.0 10/05/2015      Component Value Date/Time   NA 139 10/16/2016 1153   K 4.0 10/16/2016 1153   CL 105 10/16/2016 1153     CO2 28  10/16/2016 1153   GLUCOSE 113 (H) 10/16/2016 1153   BUN 27 (H) 10/16/2016 1153   CREATININE 1.06 10/16/2016 1153   CREATININE 0.51 08/21/2013 1337   CALCIUM 9.7 10/16/2016 1153   PROT 7.1 10/16/2016 1153   ALBUMIN 3.6 10/16/2016 1153   AST 21 10/16/2016 1153   ALT 16 10/16/2016 1153   ALKPHOS 86 10/16/2016 1153   BILITOT 0.7 10/16/2016 1153   GFRNONAA >90 02/23/2014 1356   GFRNONAA >89 08/03/2013 1127   GFRAA >90 02/23/2014 1356   GFRAA >89 08/03/2013 1127   Lab Results  Component Value Date   CHOL 278 (H) 03/17/2013   HDL 121 03/17/2013   LDLCALC 136 (H) 03/17/2013   TRIG 106 03/17/2013   CHOLHDL 2.3 03/17/2013   Lab Results  Component Value Date   HGBA1C 5.1 01/18/2016   Lab Results  Component Value Date   WNIOEVOJ50 093 (H) 10/05/2015   Lab Results  Component Value Date   TSH 0.82 10/05/2015       ASSESSMENT AND PLAN  Pain in both upper extremities - Plan: NCV with EMG(electromyography)  Cervical spondylosis with radiculopathy - Plan: NCV with EMG(electromyography)  Spinal stenosis of lumbar region without neurogenic claudication  Sleep difficulties  Chronic pain syndrome  Disturbance of skin sensation  Abnormal gait  Muscle spasm  Cervicalgia - Plan: MR CERVICAL SPINE WO CONTRAST    In summary, Misty [name] is a 56 year old woman who was diagnosed with MS in the past. The diagnosis has been questioned more recently. I had a chance to review several of her brain MRIs. The white matter foci are nonspecific and are actually more specific with chronic microvascular ischemic changes that with MS.   Also her symptoms are nonspecific and her exam does not show evidence of MS.  Therefore I agree with her evaluation at [facility] that she should not be on a disease modifying therapy at this time.   Her symptoms are primarily pain related. She does have significant spinal stenosis at L3-L4 and L4-L5 and that is likely contributing greatly to  her stooped over gait.   She also reports claudication-like symptoms with prolonged standing or walking also consistent with a spinal stenosis. We discussed that if the symptoms worsen she would need to be referred to neurosurgery for possible decompression surgery.  She continues to report a lot of symptoms in the left arm despite having had surgery in 2015. We need to check an MRI of the cervical spine to determine if she has had progression of degenerative change above or below the fusion that might be responsible for her symptoms. Additionally, to further evaluate the possibility of radiculopathy and mononeuropathy in the arm we will check a nerve conduction and EMG study. She will be placed on gabapentin and titrate up to 1200 mg a day ij split dose  She will return for her EMG/NCV study and I will see her at that visit. Further follow-up will be arranged based on those results and the results of her imaging studies.  Thank you for asking me to see [name] for a neurologic consultation. Please let me know if I can be of further assistance with her or other patients in the future.   Irene Mitcham A. Felecia Shelling, MD, PhD, FAAN Certified in Neurology, Clinical Neurophysiology, Sleep Medicine, Pain Medicine and Neuroimaging Director, [place] at [facility] [address] [place], [place] [address] [phone]

## 2016-12-18 NOTE — Therapy (Signed)
[facility] [address], [place], [place], [address] Phone: [phone]   Fax:  [phone]  Physical Therapy Evaluation  Patient Details  Name: Misty [name] MRN: [id number] Date of Birth: Mar 14, 1961 Referring Provider: Billey Gosling, MD  Encounter Date: 12/18/2016      PT End of Session - 12/18/16 1312    Visit Number 1   Number of Visits 10   Date for PT Re-Evaluation 02/12/17   PT Start Time 1228   PT Stop Time 1312   PT Time Calculation (min) 44 min   Activity Tolerance Patient tolerated treatment well   Behavior During Therapy [facility] for tasks assessed/performed      Past Medical History:  Diagnosis Date  . Allergy   . Arthritis   . Back pain   . Colon cancer ([place])   . Depression   . Diarrhea   . GERD (gastroesophageal reflux disease)   . Hypertension   . Insomnia   . MS (multiple sclerosis) ([place])   . Other hammer toe (acquired) 03/31/2013  . Pneumonia    hx of  . Umbilical hernia     Past Surgical History:  Procedure Laterality Date  . ABDOMINAL HYSTERECTOMY    . ANTERIOR CERVICAL DECOMP/DISCECTOMY FUSION N/A 03/01/2014   Procedure: ANTERIOR CERVICAL DECOMPRESSION/DISCECTOMY FUSION 1 LEVEL;  Surgeon: Charlie Pitter, MD;  Location: [place] [facility];  Service: Neurosurgery;  Laterality: N/A;  ANTERIOR CERVICAL DECOMPRESSION/DISCECTOMY FUSION 1 LEVEL  . BUNIONECTOMY Right 10/14/2014   @[facility]   . CESAREAN SECTION    . COLON SURGERY    . COLONOSCOPY    . FOOT SURGERY Right   . GASTRIC BYPASS    . Hammer Toe Repair Right 10/14/2014   RT #2, @[facility]   . TENOTOMY Right 10/14/2014   RT #3, @[facility]     There were no vitals filed for this visit.       Subjective Assessment - 12/18/16 1234    Subjective Pt presents to PT with complaints of chonic LBP with more severe LBP over the past 6 months.   Pt denies any recent treatment for lumbar symptoms.  Pt reports that she was treated for MS until 2016 and she was  told she didn't have it and was taken of all medications.  Pt with increased symptoms since that time.     Pertinent History MS-?, colon cancer, HTN   Limitations Standing   How long can you stand comfortably? 30 minutes   How long can you walk comfortably? 30 minutes   Diagnostic tests MRI 07/2016: L2 compression fracture (chronic), stenosis in lumbar spine   Patient Stated Goals stand and walk longer, reduce LBP, lifting, housework   Currently in Pain? Yes   Pain Score 5   3-8/10 over the past week   Pain Location Back   Pain Orientation Right;Lower;Left   Pain Descriptors / Indicators Sharp;Shooting   Pain Type Chronic pain   Pain Radiating Towards Lt LE to toes   Pain Onset More than a month ago   Pain Frequency Constant   Aggravating Factors  standing and walking, housework, lifting   Pain Relieving Factors rest, heat/ice            [facility] PT Assessment - 12/18/16 0001      Assessment   Medical Diagnosis chronic midline low back pain with sciatica presence unspecified   Referring Provider Billey Gosling, MD   Onset Date/Surgical Date 06/18/16   Next MD Visit 3 months  Prior Therapy none     Precautions   Precautions Other (comment)  history of cancer, lumbar compression fracture     Restrictions   Weight Bearing Restrictions No     Balance Screen   Has the patient fallen in the past 6 months Yes   How many times? 1  walking down hill   Has the patient had a decrease in activity level because of a fear of falling?  No   Is the patient reluctant to leave their home because of a fear of falling?  No     Home Environment   Living Environment Private residence   Living Arrangements Alone   Type of [place] to enter   Home Layout Two level     Prior Function   Level of Independence Independent   Vocation On disability   Leisure cooking     Cognition   Overall Cognitive Status Within Functional Limits for tasks assessed      Observation/Other Assessments   Focus on Therapeutic Outcomes (FOTO)  49% limitation     Posture/Postural Control   Posture/Postural Control Postural limitations   Postural Limitations Rounded Shoulders;Forward head;Decreased lumbar lordosis;Flexed trunk     ROM / Strength   AROM / PROM / Strength AROM;PROM;Strength     AROM   Overall AROM  Deficits   Overall AROM Comments flexion is full with Rt lumbar pain, sidebending limited by 25% with Rt lumbar pain, extension is full with pain.       PROM   Overall PROM  Deficits   Overall PROM Comments hip flexibility limited by 25-50% bil.  Pain on the Rt.with P/ROM.     Strength   Overall Strength Within functional limits for tasks performed   Overall Strength Comments 4+/5 bil LE strength     Palpation   Spinal mobility reduced spinal mobility with PA mobs and pain T10-L5   Palpation comment tension and trigger points to bil lumbar paraspinals and gluteals.     Special Tests    Special Tests Lumbar   Lumbar Tests Slump Test     Slump test   Findings Negative   Side Left     Transfers   Transfers Sit to Stand;Stand to Sit   Sit to Stand With upper extremity assist   Stand to Sit With upper extremity assist     Ambulation/Gait   Ambulation/Gait Yes   Gait Pattern Step-through pattern;Decreased stride length;Trunk flexed;Decreased trunk rotation   Ambulation Surface Level            Objective measurements completed on examination: See above findings.                  PT Education - 12/18/16 1301    Education provided Yes   Education Details lumbar flexibility, horizontal abduction   Person(s) Educated Patient   Methods Explanation;Demonstration;Handout   Comprehension Verbalized understanding;Returned demonstration          PT Short Term Goals - 12/18/16 1303      PT SHORT TERM GOAL #1   Title be independent in initial HEP   Baseline --   Time 4   Period Weeks     PT SHORT TERM GOAL #2    Title verbalize and demonstrate correct body mechanics modifications for lumbar protection with home tasks   Time 4   Period Weeks   Status New     PT SHORT TERM GOAL #3   Title report  a 25% reduction in LBP with standing to cook   Time 4   Period Weeks   Status New     PT SHORT TERM GOAL #4   Title --     PT SHORT TERM GOAL #5   Title --           PT Long Term Goals - 12/18/16 1224      PT LONG TERM GOAL #1   Title be independent with advanced HEP   Time 8   Period Weeks   Status New     PT LONG TERM GOAL #2   Title reduce FOTO to < or = to 39% limitation   Time 8   Period Weeks   Status New     PT LONG TERM GOAL #3   Title initiate a regular walking program and verbalize understanding of safe progression   Time 8   Status New     PT LONG TERM GOAL #4   Title report a 50% reduction in LBP with standing to cook   Time 8   Period Weeks   Status New     PT LONG TERM GOAL #5   Title perform housework including lifting and vacuuming with 25% less pain and increased ease   Time 8   Period Weeks   Status New                Plan - 12/18/16 1313    Clinical Impression Statement Pt presents to PT with complaints of chronic LBP with 2 year history and worsening symptoms over the past 6 months.  Recent imaging showed lumbar stenosis and L2 compression fracture. Pt reports 3-8/10 LBP and Lt LE radiculopathy. Pt is limited to standing and walking due to pain.  Pt demonstrates forward head, rounded shoulders and flexted trunk posture, slow mobility with gait, hip stiffness and trigger points/spasm in bil lumbar paraspinals, Rt quadratus and gluteals bil.  Pt will benefit from skilled PT for core and postural strength, body mechanics education, flexibility, dry needling, manual and modalities to reduce pain.     History and Personal Factors relevant to plan of care: MS in history although no longer being treated, chronic LBP, L2 compression fracture/stenosis,  depression   Clinical Presentation Evolving   Clinical Presentation due to: worsening over the past 6 months   Clinical Decision Making Moderate   Rehab Potential Good   PT Frequency 2x / week   PT Duration 8 weeks   PT Treatment/Interventions ADLs/Self Care Home Management;Cryotherapy;Electrical Stimulation;Functional mobility training;Therapeutic activities;Therapeutic exercise;Neuromuscular re-education;Patient/family education;Passive range of motion;Manual techniques;Dry needling;Taping   PT Next Visit Plan consider dry needling to Rt quadratus, lumbar spine and gluteals, check on insurance for home TENs, core and postural strength, review HEP, body mechanics education   Consulted and Agree with Plan of Care Patient      Patient will benefit from skilled therapeutic intervention in order to improve the following deficits and impairments:  Postural dysfunction, Impaired flexibility, Pain, Decreased activity tolerance, Increased muscle spasms, Decreased endurance, Decreased range of motion, Difficulty walking  Visit Diagnosis: Chronic bilateral low back pain, with sciatica presence unspecified - Plan: PT plan of care cert/re-cert  Abnormal posture - Plan: PT plan of care cert/re-cert  Muscle spasm of back - Plan: PT plan of care cert/re-cert  Muscle weakness (generalized) - Plan: PT plan of care cert/re-cert      G-Codes - 04/54/09 June 19, 1226    Functional Assessment Tool Used (Outpatient Only) FOTO: 49% limitation  Functional Limitation Mobility: Walking and moving around   Mobility: Walking and Moving Around Current Status [phone]) At least 40 percent but less than 60 percent impaired, limited or restricted   Mobility: Walking and Moving Around Goal Status [phone]) At least 20 percent but less than 40 percent impaired, limited or restricted       Problem List Patient Active Problem List   Diagnosis Date Noted  . Burn 12/14/2016  . Upper respiratory tract infection 12/14/2016  .  Chronic midline low back pain 07/20/2016  . Sacral mass 07/20/2016  . Osteoarthritis of right hip 07/20/2016  . Spinal stenosis of lumbar region 11/29/2015  . Positive urine drug screen 11/16/2015  . Chronic pain syndrome 10/12/2015  . Umbilical hernia 04/59/9774  . Sleep difficulties 07/15/2015  . Bunion, right 06/20/2015  . Callus of foot 04/27/2015  . Muscle spasm 04/16/2015  . B12 deficiency 03/30/2015  . Hammer toe of left foot 02/16/2015  . Fibrosis of skin of lower extremity 02/16/2015  . Status post right foot surgery 10/26/2014  . Cervical spondylosis with radiculopathy 03/01/2014  . Hammer toe of right foot 01/26/2014  . Benign intracranial hypertension 06/18/2013  . Pain in lower limb 05/22/2013  . Abnormal gait 04/21/2013  . Arthritis 04/21/2013  . Disturbance of skin sensation 04/21/2013  . Cephalalgia 04/21/2013  . Porokeratosis 03/31/2013  . Pain, foot 03/31/2013  . Vitamin D deficiency 04/18/2010  . Fatigue 04/14/2010  . Back pain with right-sided radiculopathy 11/29/2008  . MEDIAL MENISCUS TEAR, LEFT 06/30/2008  . BARRETTS ESOPHAGUS 06/28/2008  . OSTEOARTHRITIS, KNEES, BILATERAL 06/28/2008  . OBESITY 05/24/2008  . HYPERTENSION, BENIGN ESSENTIAL 05/24/2008  . IRRITABLE BOWEL SYNDROME 05/24/2008  . BARIATRIC SURGERY STATUS 03/19/2006  . NEOPLASM, MALIGNANT, COLON, HX OF 03/19/1997    Sigurd Sos, PT 12/18/16 1:22 PM  [facility] [address], [place], [place], [address] Phone: [phone]   Fax:  [phone]  Name: Misty [name] MRN: [id number] Date of Birth: 05/27/1960

## 2016-12-18 NOTE — Patient Instructions (Addendum)
Perform all exercises below:  Hold _20___ seconds. Repeat _3___ times.  Do __3__ sessions per day. CAUTION: Movement should be gentle, steady and slow.  Knee to Chest  Lying supine, bend involved knee to chest. Perform with each leg.     Feet and knees together, arms outstretched, rotate knees left, turning head in opposite direction, until stretch is felt.      HIP: Hamstrings - Short Sitting   Rest leg on raised surface. Keep knee straight. Lift chest.     Resisted Horizontal Abduction: Bilateral   Sit or stand, tubing in both hands, arms out in front. Keeping arms straight, pinch shoulder blades together and stretch arms out. Repeat _10___ times per set. Do 2____ sets per session. Do _1-2___ sessions per day.   [facility] [address], [place], [place] [address] Phone # [phone] Fax [phone]

## 2016-12-20 ENCOUNTER — Ambulatory Visit: Payer: Medicare Other

## 2016-12-26 ENCOUNTER — Ambulatory Visit: Payer: Medicare Other

## 2016-12-28 ENCOUNTER — Ambulatory Visit: Payer: Medicare Other | Admitting: Physical Therapy

## 2017-01-01 ENCOUNTER — Ambulatory Visit: Payer: Medicare Other | Admitting: Physical Therapy

## 2017-01-01 DIAGNOSIS — M62830 Muscle spasm of back: Secondary | ICD-10-CM | POA: Diagnosis not present

## 2017-01-01 DIAGNOSIS — M545 Low back pain: Secondary | ICD-10-CM | POA: Diagnosis not present

## 2017-01-01 DIAGNOSIS — M6281 Muscle weakness (generalized): Secondary | ICD-10-CM | POA: Diagnosis not present

## 2017-01-01 DIAGNOSIS — G8929 Other chronic pain: Secondary | ICD-10-CM

## 2017-01-01 DIAGNOSIS — R293 Abnormal posture: Secondary | ICD-10-CM

## 2017-01-01 NOTE — Therapy (Signed)
[facility] [address] [place], [place], [address] Phone: [phone]   Fax:  [phone]  Physical Therapy Treatment  Patient Details  Name: Misty [name] MRN: [id number] Date of Birth: Nov 16, 1960 Referring Provider: Billey Gosling, MD  Encounter Date: 01/01/2017      PT End of Session - 01/01/17 1650    Visit Number 2   Number of Visits 10   Date for PT Re-Evaluation 02/12/17   PT Start Time 6063   PT Stop Time 1612   PT Time Calculation (min) 38 min   Activity Tolerance Patient tolerated treatment well      Past Medical History:  Diagnosis Date  . Allergy   . Arthritis   . Back pain   . Colon cancer ([place])   . Depression   . Diarrhea   . GERD (gastroesophageal reflux disease)   . Hypertension   . Insomnia   . MS (multiple sclerosis) ([place])   . Other hammer toe (acquired) 03/31/2013  . Pneumonia    hx of  . Umbilical hernia     Past Surgical History:  Procedure Laterality Date  . ABDOMINAL HYSTERECTOMY    . ANTERIOR CERVICAL DECOMP/DISCECTOMY FUSION N/A 03/01/2014   Procedure: ANTERIOR CERVICAL DECOMPRESSION/DISCECTOMY FUSION 1 LEVEL;  Surgeon: Charlie Pitter, MD;  Location: [place] [facility];  Service: Neurosurgery;  Laterality: N/A;  ANTERIOR CERVICAL DECOMPRESSION/DISCECTOMY FUSION 1 LEVEL  . BUNIONECTOMY Right 10/14/2014   @[facility]   . CESAREAN SECTION    . COLON SURGERY    . COLONOSCOPY    . FOOT SURGERY Right   . GASTRIC BYPASS    . Hammer Toe Repair Right 10/14/2014   RT #2, @[facility]   . TENOTOMY Right 10/14/2014   RT #3, @[facility]     There were no vitals filed for this visit.      Subjective Assessment - 01/01/17 1534    Subjective I had the flu and that's why I couldn't come.  Using the bands at home.     Pertinent History MS-?, colon cancer, HTN   Diagnostic tests MRI 07/2016: L2 compression fracture (chronic), stenosis in lumbar spine   Patient Stated Goals stand and walk longer, reduce LBP,  lifting, housework   Currently in Pain? Yes   Pain Score 6    Pain Location Back   Pain Orientation Lower   Pain Type Chronic pain   Aggravating Factors  standing, walking and lifting; up and down stairs   Pain Relieving Factors reclined sitting up                         [facility] Adult PT Treatment/Exercise - 01/01/17 0001      Therapeutic Activites    Therapeutic Activities ADL's   ADL's walking, standing     Neuro Re-ed    Neuro Re-ed Details  transversus abdominus activation     Lumbar Exercises: Seated   Other Seated Lumbar Exercises foam roll push down to activate lower abdominals 10x     Lumbar Exercises: Supine   Ab Set 10 reps  with wedge behind back   Isometric Hip Flexion 10 reps  with wedge behind back   Other Supine Lumbar Exercises alternating ball squeeze with isometric hip abduction/external rotation with red band 10x     Moist Heat Therapy   Number Minutes Moist Heat 15 Minutes   Moist Heat Location Lumbar Spine     Electrical Stimulation   Electrical Stimulation Location  lumbar   Electrical Stimulation Action IFC   Electrical Stimulation Parameters supine with wedge behind back and wedge behind knees 8 ma 15 min   Electrical Stimulation Goals Pain                PT Education - 01/01/17 1650    Education provided Yes   Education Details abominal brace, abdominal brace with opposite hand to knee push   Person(s) Educated Patient   Methods Explanation;Handout   Comprehension Verbalized understanding;Returned demonstration          PT Short Term Goals - 12/18/16 1303      PT SHORT TERM GOAL #1   Title be independent in initial HEP   Baseline --   Time 4   Period Weeks     PT SHORT TERM GOAL #2   Title verbalize and demonstrate correct body mechanics modifications for lumbar protection with home tasks   Time 4   Period Weeks   Status New     PT SHORT TERM GOAL #3   Title report a 25% reduction in LBP with standing  to cook   Time 4   Period Weeks   Status New     PT SHORT TERM GOAL #4   Title --     PT SHORT TERM GOAL #5   Title --           PT Long Term Goals - 12/18/16 1224      PT LONG TERM GOAL #1   Title be independent with advanced HEP   Time 8   Period Weeks   Status New     PT LONG TERM GOAL #2   Title reduce FOTO to < or = to 39% limitation   Time 8   Period Weeks   Status New     PT LONG TERM GOAL #3   Title initiate a regular walking program and verbalize understanding of safe progression   Time 8   Status New     PT LONG TERM GOAL #4   Title report a 50% reduction in LBP with standing to cook   Time 8   Period Weeks   Status New     PT LONG TERM GOAL #5   Title perform housework including lifting and vacuuming with 25% less pain and increased ease   Time 8   Period Weeks   Status New               Plan - 01/01/17 1651    Clinical Impression Statement The patient is able to participate in a low level core strengthening exercises with accomodations (wedge behind back) for comfort.  Verbal cues to activate transverse abdominus muscles and to coordinate breathing to avoid the Valsalva maneuver.  She reports good pain relief from electrical stimulation and is considering getting a unit for home.     Rehab Potential Good   PT Frequency 2x / week   PT Duration 8 weeks   PT Treatment/Interventions ADLs/Self Care Home Management;Cryotherapy;Electrical Stimulation;Functional mobility training;Therapeutic activities;Therapeutic exercise;Neuromuscular re-education;Patient/family education;Passive range of motion;Manual techniques;Dry needling;Taping   PT Next Visit Plan consider dry needling to Rt quadratus, lumbar spine and gluteals, low level  core and postural strength, review HEP, body mechanics education      Patient will benefit from skilled therapeutic intervention in order to improve the following deficits and impairments:  Postural dysfunction, Impaired  flexibility, Pain, Decreased activity tolerance, Increased muscle spasms, Decreased endurance, Decreased range of motion, Difficulty walking  Visit Diagnosis: Chronic bilateral low back pain, with sciatica presence unspecified  Abnormal posture  Muscle spasm of back  Muscle weakness (generalized)     Problem List Patient Active Problem List   Diagnosis Date Noted  . Burn 12/14/2016  . Upper respiratory tract infection 12/14/2016  . Chronic midline low back pain 07/20/2016  . Sacral mass 07/20/2016  . Osteoarthritis of right hip 07/20/2016  . Spinal stenosis of lumbar region 11/29/2015  . Positive urine drug screen 11/16/2015  . Chronic pain syndrome 10/12/2015  . Umbilical hernia 62/94/7654  . Sleep difficulties 07/15/2015  . Bunion, right 06/20/2015  . Callus of foot 04/27/2015  . Muscle spasm 04/16/2015  . B12 deficiency 03/30/2015  . Hammer toe of left foot 02/16/2015  . Fibrosis of skin of lower extremity 02/16/2015  . Status post right foot surgery 10/26/2014  . Cervical spondylosis with radiculopathy 03/01/2014  . Hammer toe of right foot 01/26/2014  . Benign intracranial hypertension 06/18/2013  . Pain in lower limb 05/22/2013  . Abnormal gait 04/21/2013  . Arthritis 04/21/2013  . Disturbance of skin sensation 04/21/2013  . Cephalalgia 04/21/2013  . Porokeratosis 03/31/2013  . Pain, foot 03/31/2013  . Vitamin D deficiency 04/18/2010  . Fatigue 04/14/2010  . Back pain with right-sided radiculopathy 11/29/2008  . MEDIAL MENISCUS TEAR, LEFT 06/30/2008  . BARRETTS ESOPHAGUS 06/28/2008  . OSTEOARTHRITIS, KNEES, BILATERAL 06/28/2008  . OBESITY 05/24/2008  . HYPERTENSION, BENIGN ESSENTIAL 05/24/2008  . IRRITABLE BOWEL SYNDROME 05/24/2008  . BARIATRIC SURGERY STATUS 03/19/2006  . NEOPLASM, MALIGNANT, COLON, HX OF 03/19/1997   Ruben Im, PT 01/01/17 4:55 PM Phone: [phone] Fax: [phone]  Alvera Singh 01/01/2017, 4:55 PM  [facility] [address], [place], [place], [address] Phone: [phone]   Fax:  [phone]  Name: IDALIS HOELTING MRN: [id number] Date of Birth: [birth year]

## 2017-01-01 NOTE — Patient Instructions (Addendum)
    TENS stands for Transcutaneous Electrical Nerve Stimulation. In other words, electrical impulses are allowed to pass through the skin in order to excite a nerve.   Purpose and Use of TENS:  TENS is a method used to manage acute and chronic pain without the use of drugs. It has been effective in managing pain associated with surgery, sprains, strains, trauma, rheumatoid arthritis, and neuralgias. It is a non-addictive, low risk, and non-invasive technique used to control pain. It is not, by any means, a curative form of treatment.   How TENS Works:  Most TENS units are a Paramedic unit powered by one 9 volt battery. Attached to the outside of the unit are two lead wires where two pins and/or snaps connect on each wire. All units come with a set of four reusable pads or electrodes. These are placed on the skin surrounding the area involved. By inserting the leads into  the pads, the electricity can pass from the unit making the circuit complete.  As the intensity is turned up slowly, the electrical current enters the body from the electrodes through the skin to the surrounding nerve fibers. This triggers the release of hormones from within the body. These hormones contain pain relievers. By increasing the circulation of these hormones, the person's pain may be lessened. It is also believed that the electrical stimulation itself helps to block the pain messages being sent to the brain, thus also decreasing the body's perception of pain.   Hazards:  TENS units are NOT to be used by patients with PACEMAKERS, DEFIBRILLATORS, DIABETIC PUMPS, PREGNANT WOMEN, and patients with SEIZURE DISORDERS.  TENS units are NOT to be used over the heart, throat, brain, or spinal cord.  One of the major side effects from the TENS unit may be skin irritation. Some people may develop a rash if they are sensitive to the materials used in the electrodes or the connecting wires.     Avoid overuse due the body  getting used to the stem making it not as effective over time.   TENS UNIT  This is helpful for muscle pain and spasm.   Search and Purchase a TENS 7000 2nd edition at www.tenspros.com or www.amazon.com  (It should be less than $30)     TENS unit instructions:   Do not shower or bathe with the unit on  Turn the unit off before removing electrodes or batteries  If the electrodes lose stickiness add a drop of water to the electrodes after they are disconnected from the unit and place on plastic sheet. If you continued to have difficulty, call the TENS unit company to purchase more electrodes.  Do not apply lotion on the skin area prior to use. Make sure the skin is clean and dry as this will help prolong the life of the electrodes.  After use, always check skin for unusual red areas, rash or other skin difficulties. If there are any skin problems, does not apply electrodes to the same area.  Never remove the electrodes from the unit by pulling the wires.  Do not use the TENS unit or electrodes other than as directed.  Do not change electrode placement without consulting your therapist or physician.  Keep 2 fingers with between each electrode.  Ruben Im PT [facility] [address], [place], [place] [address] Phone # [phone] Fax [phone]

## 2017-01-03 ENCOUNTER — Ambulatory Visit: Payer: Medicare Other | Admitting: Physical Therapy

## 2017-01-03 DIAGNOSIS — M6281 Muscle weakness (generalized): Secondary | ICD-10-CM

## 2017-01-03 DIAGNOSIS — M62830 Muscle spasm of back: Secondary | ICD-10-CM

## 2017-01-03 DIAGNOSIS — G8929 Other chronic pain: Secondary | ICD-10-CM | POA: Diagnosis not present

## 2017-01-03 DIAGNOSIS — R293 Abnormal posture: Secondary | ICD-10-CM | POA: Diagnosis not present

## 2017-01-03 DIAGNOSIS — M545 Low back pain: Principal | ICD-10-CM

## 2017-01-03 DIAGNOSIS — G894 Chronic pain syndrome: Secondary | ICD-10-CM | POA: Diagnosis not present

## 2017-01-03 NOTE — Therapy (Addendum)
[facility] [address], [place], [place], [address] Phone: [phone]   Fax:  [phone]  Physical Therapy Treatment  Patient Details  Name: Misty [name] MRN: [id number] Date of Birth: [birth year] Referring Provider: Billey Gosling, MD  Encounter Date: 01/03/2017      PT End of Session - 01/03/17 1308    Visit Number 3   Number of Visits 10   Date for PT Re-Evaluation 02/12/17   PT Start Time 1240   PT Stop Time 1328   PT Time Calculation (min) 48 min   Activity Tolerance Patient tolerated treatment well      Past Medical History:  Diagnosis Date  . Allergy   . Arthritis   . Back pain   . Colon cancer ([place])   . Depression   . Diarrhea   . GERD (gastroesophageal reflux disease)   . Hypertension   . Insomnia   . MS (multiple sclerosis) ([place])   . Other hammer toe (acquired) 03/31/2013  . Pneumonia    hx of  . Umbilical hernia     Past Surgical History:  Procedure Laterality Date  . ABDOMINAL HYSTERECTOMY    . ANTERIOR CERVICAL DECOMP/DISCECTOMY FUSION N/A 03/01/2014   Procedure: ANTERIOR CERVICAL DECOMPRESSION/DISCECTOMY FUSION 1 LEVEL;  Surgeon: Charlie Pitter, MD;  Location: [place] [facility];  Service: Neurosurgery;  Laterality: N/A;  ANTERIOR CERVICAL DECOMPRESSION/DISCECTOMY FUSION 1 LEVEL  . BUNIONECTOMY Right 10/14/2014   _0   . CESAREAN SECTION    . COLON SURGERY    . COLONOSCOPY    . FOOT SURGERY Right   . GASTRIC BYPASS    . Hammer Toe Repair Right 10/14/2014   RT #2, _1   . TENOTOMY Right 10/14/2014   RT #3, _2     There were no vitals filed for this visit.      Subjective Assessment - 01/03/17 1240    Subjective Had a pinch in my back a few times.  Has been helping her daughter move.  I took some pain medicine at 5 AM.  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  Several more appointments today.     Patient Stated Goals stand and walk longer, reduce LBP,  lifting, housework   Currently in Pain? Yes   Pain Score 5    Pain Location Back   Pain Orientation Lower   Pain Type Chronic pain                         [facility] Adult PT Treatment/Exercise - 01/03/17 0001      Therapeutic Activites    ADL's walking, standing     Neuro Re-ed    Neuro Re-ed Details  transversus abdominus activation     Lumbar Exercises: Standing   Other Standing Lumbar Exercises attemped yellow band over the door pulldowns but discontinued secondary to low back pain     Lumbar Exercises: Seated   Other Seated Lumbar Exercises 2# plyo ball chops 10x     Lumbar Exercises: Supine   Ab Set 10 reps  with wedge behind back   Isometric Hip Flexion 10 reps  with wedge behind back   Other Supine Lumbar Exercises isometric push into red ball 10x     Moist Heat Therapy   Number Minutes Moist Heat 15 Minutes   Moist Heat Location Lumbar Spine     Electrical Stimulation   Electrical Stimulation Location lumbar  Electrical Stimulation Action pre mod   Electrical Stimulation Parameters supine with wedge behind back and behind knees 13 ma 15 min   Electrical Stimulation Goals Pain                  PT Short Term Goals - 12/18/16 1303      PT SHORT TERM GOAL #1   Title be independent in initial HEP   Baseline --   Time 4   Period Weeks     PT SHORT TERM GOAL #2   Title verbalize and demonstrate correct body mechanics modifications for lumbar protection with home tasks   Time 4   Period Weeks   Status New     PT SHORT TERM GOAL #3   Title report a 25% reduction in LBP with standing to cook   Time 4   Period Weeks   Status New     PT SHORT TERM GOAL #4   Title --     PT SHORT TERM GOAL #5   Title --           PT Long Term Goals - 12/18/16 1224      PT LONG TERM GOAL #1   Title be independent with advanced HEP   Time 8   Period Weeks   Status New     PT LONG TERM GOAL #2   Title reduce FOTO to < or = to 39%  limitation   Time 8   Period Weeks   Status New     PT LONG TERM GOAL #3   Title initiate a regular walking program and verbalize understanding of safe progression   Time 8   Status New     PT LONG TERM GOAL #4   Title report a 50% reduction in LBP with standing to cook   Time 8   Period Weeks   Status New     PT LONG TERM GOAL #5   Title perform housework including lifting and vacuuming with 25% less pain and increased ease   Time 8   Period Weeks   Status New               Plan - 01/03/17 1312    Clinical Impression Statement The patient demonstrates fewer pain behaviors today although her pain level is moderate.  She attributes this to helping her daughter move.  Needs continued tactile and verbal cues for transverse abdominus activation and to coordinate breathing.  Good pain relief from electrical stimulation and moist heat.  Preferred position is supine with a wedge.     Rehab Potential Good   PT Frequency 2x / week   PT Duration 8 weeks   PT Treatment/Interventions ADLs/Self Care Home Management;Cryotherapy;Electrical Stimulation;Functional mobility training;Therapeutic activities;Therapeutic exercise;Neuromuscular re-education;Patient/family education;Passive range of motion;Manual techniques;Dry needling;Taping   PT Next Visit Plan decompression series;  core strengthening;  HEP; patient interested in home TENS (give EMSI paperwork next time)      Patient will benefit from skilled therapeutic intervention in order to improve the following deficits and impairments:  Postural dysfunction, Impaired flexibility, Pain, Decreased activity tolerance, Increased muscle spasms, Decreased endurance, Decreased range of motion, Difficulty walking  Visit Diagnosis: Chronic bilateral low back pain, with sciatica presence unspecified  Abnormal posture  Muscle spasm of back  Muscle weakness (generalized)     Problem List Patient Active Problem List   Diagnosis Date  Noted  . Burn 12/14/2016  . Upper respiratory tract infection 12/14/2016  . Chronic midline  low back pain 07/20/2016  . Sacral mass 07/20/2016  . Osteoarthritis of right hip 07/20/2016  . Spinal stenosis of lumbar region 11/29/2015  . Positive urine drug screen 11/16/2015  . Chronic pain syndrome 10/12/2015  . Umbilical hernia 54/30/1484  . Sleep difficulties 07/15/2015  . Bunion, right 06/20/2015  . Callus of foot 04/27/2015  . Muscle spasm 04/16/2015  . B12 deficiency 03/30/2015  . Hammer toe of left foot 02/16/2015  . Fibrosis of skin of lower extremity 02/16/2015  . Status post right foot surgery 10/26/2014  . Cervical spondylosis with radiculopathy 03/01/2014  . Hammer toe of right foot 01/26/2014  . Benign intracranial hypertension 06/18/2013  . Pain in lower limb 05/22/2013  . Abnormal gait 04/21/2013  . Arthritis 04/21/2013  . Disturbance of skin sensation 04/21/2013  . Cephalalgia 04/21/2013  . Porokeratosis 03/31/2013  . Pain, foot 03/31/2013  . Vitamin D deficiency 04/18/2010  . Fatigue 04/14/2010  . Back pain with right-sided radiculopathy 11/29/2008  . MEDIAL MENISCUS TEAR, LEFT 06/30/2008  . BARRETTS ESOPHAGUS 06/28/2008  . OSTEOARTHRITIS, KNEES, BILATERAL 06/28/2008  . OBESITY 05/24/2008  . HYPERTENSION, BENIGN ESSENTIAL 05/24/2008  . IRRITABLE BOWEL SYNDROME 05/24/2008  . BARIATRIC SURGERY STATUS 03/19/2006  . NEOPLASM, MALIGNANT, COLON, HX OF 03/19/1997   Ruben Im, PT 01/03/17 1:24 PM Phone: [phone] Fax: [phone]  Alvera Singh 01/03/2017, 1:23 PM PHYSICAL THERAPY DISCHARGE SUMMARY  Visits from Start of Care: 3  Current functional level related to goals / functional outcomes: See above.  Pt didn't return to PT.   Remaining deficits: See above   Education / Equipment: HEP, posture/body mechanics Plan: Patient agrees to discharge.  Patient goals were not met. Patient is being discharged due to not returning since the last  visit.  ?????        Sigurd Sos, PT 02/26/17 12:17 PM   [facility] [address], [place], [place], [address] Phone: [phone]   Fax:  [phone]  Name: Misty [name] MRN: [id number] Date of Birth: [birth year]

## 2017-01-16 ENCOUNTER — Ambulatory Visit
Admission: RE | Admit: 2017-01-16 | Discharge: 2017-01-16 | Disposition: A | Discharge status: Home / Self Care | Payer: Medicare Other | Source: Ambulatory Visit | Attending: Neurology | Admitting: Neurology

## 2017-01-16 DIAGNOSIS — M542 Cervicalgia: Secondary | ICD-10-CM | POA: Diagnosis not present

## 2017-01-18 ENCOUNTER — Telehealth: Payer: Self-pay | Admitting: *Deleted

## 2017-01-18 NOTE — Telephone Encounter (Signed)
-----   Message from Britt Bottom, MD sent at 01/17/2017  3:07 PM EDT ----- Please let her know that the MRI of the cervical spine shows her previous surgery and she has mild arthritic changes at 2 other levels that did not lead to any nerve root compression. The spinal cord does not show any evidence of MS.

## 2017-01-18 NOTE — Telephone Encounter (Signed)
Spoke with Saprina this morning and reviewed below MRI results with her.  She verbalized understanding of same/fim

## 2017-01-29 ENCOUNTER — Ambulatory Visit (INDEPENDENT_AMBULATORY_CARE_PROVIDER_SITE_OTHER): Payer: Medicare Other | Admitting: Neurology

## 2017-01-29 ENCOUNTER — Encounter: Payer: Self-pay | Admitting: Neurology

## 2017-01-29 DIAGNOSIS — Z0289 Encounter for other administrative examinations: Secondary | ICD-10-CM

## 2017-01-29 DIAGNOSIS — M79602 Pain in left arm: Secondary | ICD-10-CM | POA: Diagnosis not present

## 2017-01-29 DIAGNOSIS — R200 Anesthesia of skin: Secondary | ICD-10-CM | POA: Insufficient documentation

## 2017-01-29 DIAGNOSIS — Z79891 Long term (current) use of opiate analgesic: Secondary | ICD-10-CM | POA: Diagnosis not present

## 2017-01-29 DIAGNOSIS — M4722 Other spondylosis with radiculopathy, cervical region: Secondary | ICD-10-CM

## 2017-01-29 DIAGNOSIS — M79601 Pain in right arm: Secondary | ICD-10-CM

## 2017-01-29 DIAGNOSIS — M545 Low back pain: Secondary | ICD-10-CM | POA: Diagnosis not present

## 2017-01-29 DIAGNOSIS — G894 Chronic pain syndrome: Secondary | ICD-10-CM | POA: Diagnosis not present

## 2017-01-29 MED ORDER — GABAPENTIN 300 MG PO CAPS: ORAL_CAPSULE | ORAL | 5 refills | Status: DC

## 2017-01-29 NOTE — Progress Notes (Signed)
Full Name: Ellene Bloodsaw Gender: Female MRN #: [id number] Date of Birth: 12-Sep-1960    Visit Date: 01/29/2017 09:30 Age: 56 Years 66 Months Old Examining Physician: , Arlice Colt, MD  Referring Physician: Celso Amy    History: Ms. Schwanz is a 56 year old woman with left much greater than right arm numbness, pain and clumsiness.  Nerve Conduction Studies: Bilateral median and ulnar motor responses had normal distal latencies, amplitudes and conduction velocities. There F wave responses had normal latencies. Bilateral median and ulnar sensory responses had normal peak latencies and amplitudes.   Electromyography: Needle EMG of selected muscles of the left arm was performed. There was mild chronic innervation noted within some of the C6 and C7 innervated muscles.   There was no abnormal spontaneous activity.  Impression: This NCV/EMG shows the following: 1.    Mild left C6 +/- C7 chronic radiculopathy without active features. 2.    No evidence of polyneuropathy or other neuromuscular disorder.    Richard A. Felecia Shelling, MD, PhD, FAAN Certified in Neurology, Clinical Neurophysiology, Sleep Medicine, Pain Medicine and Neuroimaging Director, [place] [facility] [address] [place], [place] [address] [phone]        [facility]    Nerve / Sites Muscle Latency Ref. Amplitude Ref. Rel Amp Segments Distance Velocity Ref. Area    ms ms mV mV %  cm m/s m/s mVms  L Median - APB     Wrist APB 3.5 ?4.4 11.3 ?4.0 100 Wrist - APB 7   32.8     Upper arm APB 7.4  12.3  109 Upper arm - Wrist 24 61 ?49 30.8  R Median - APB     Wrist APB 3.7 ?4.4 7.7 ?4.0 100 Wrist - APB 7   19.4     Upper arm APB 7.7  7.0  91.3 Upper arm - Wrist 23 57 ?49 18.5  L Ulnar - ADM     Wrist ADM 2.3 ?3.3 9.3 ?6.0 100 Wrist - ADM 7   25.2     B.Elbow ADM 6.4  7.5  80.8 B.Elbow - Wrist 21 52 ?49 22.4     A.Elbow ADM 8.2  7.5   101 A.Elbow - B.Elbow 10 55 ?49 23.0         A.Elbow - Wrist      R Ulnar - ADM     Wrist ADM 2.3 ?3.3 10.3 ?6.0 100 Wrist - ADM 7   25.7     B.Elbow ADM 6.0  8.5  81.8 B.Elbow - Wrist 22 60 ?49 22.7     A.Elbow ADM 7.6  9.4  112 A.Elbow - B.Elbow 10 64 ?49 26.5         A.Elbow - Wrist                 SNC    Nerve / Sites Rec. Site Peak Lat Amp Segments Distance    ms V  cm  L Median - Orthodromic (Dig II, Mid palm)     Dig II Wrist 3.1 20 Dig II - Wrist 13  R Median - Orthodromic (Dig II, Mid palm)     Dig II Wrist 3.3 17 Dig II - Wrist 13  L Ulnar - Orthodromic, (Dig V, Mid palm)     Dig V Wrist 3.0 23 Dig V - Wrist 11  R Ulnar - Orthodromic, (Dig V, Mid palm)  Dig V Wrist 3.0 23 Dig V - Wrist 11             F  Wave    Nerve F Lat Ref.   ms ms  L Median - APB 26.0 ?31.0  L Ulnar - ADM 26.5 ?32.0  R Median - APB 27.8 ?31.0  R Ulnar - ADM 27.5 ?32.0             EMG       EMG full       EMG Summary Table    Spontaneous MUAP Recruitment  Muscle IA Fib PSW Fasc Other Amp Dur. Poly Pattern  L. Deltoid Normal None None None _______ Normal Increased 1+ Normal  L. Triceps brachii Normal None None None _______ Normal Normal Normal Normal  L. Biceps brachii Normal None None None _______ Increased Increased 1+ Reduced  L. Pronator teres Normal None None None _______ Normal Normal Normal Normal  L. Brachioradialis Normal None None None _______ Normal Increased 1+ Reduced  L. Extensor digitorum communis Normal None None None _______ Increased Increased 1+ Reduced  L. First dorsal interosseous Normal None None None _______ Normal Normal Normal Normal

## 2017-02-19 NOTE — Telephone Encounter (Signed)
error 

## 2017-02-22 ENCOUNTER — Ambulatory Visit: Payer: Medicare Other | Admitting: Podiatry

## 2017-03-04 DIAGNOSIS — M545 Low back pain: Secondary | ICD-10-CM | POA: Diagnosis not present

## 2017-03-04 DIAGNOSIS — Z79891 Long term (current) use of opiate analgesic: Secondary | ICD-10-CM | POA: Diagnosis not present

## 2017-03-04 DIAGNOSIS — G894 Chronic pain syndrome: Secondary | ICD-10-CM | POA: Diagnosis not present

## 2017-03-14 ENCOUNTER — Ambulatory Visit (INDEPENDENT_AMBULATORY_CARE_PROVIDER_SITE_OTHER): Payer: Medicare Other | Admitting: *Deleted

## 2017-03-14 DIAGNOSIS — E538 Deficiency of other specified B group vitamins: Secondary | ICD-10-CM | POA: Diagnosis not present

## 2017-03-14 MED ORDER — CYANOCOBALAMIN 1000 MCG/ML IJ SOLN
1000.0000 ug | Freq: Once | INTRAMUSCULAR | Status: AC
  Administered 2017-03-14: 1000 ug via INTRAMUSCULAR

## 2017-04-03 ENCOUNTER — Encounter: Payer: Self-pay | Admitting: Podiatry

## 2017-04-03 ENCOUNTER — Ambulatory Visit (INDEPENDENT_AMBULATORY_CARE_PROVIDER_SITE_OTHER): Payer: Medicare Other | Admitting: Podiatry

## 2017-04-03 DIAGNOSIS — B351 Tinea unguium: Secondary | ICD-10-CM

## 2017-04-03 DIAGNOSIS — Q828 Other specified congenital malformations of skin: Secondary | ICD-10-CM | POA: Diagnosis not present

## 2017-04-03 DIAGNOSIS — M79674 Pain in right toe(s): Secondary | ICD-10-CM

## 2017-04-03 DIAGNOSIS — M79675 Pain in left toe(s): Secondary | ICD-10-CM

## 2017-04-03 NOTE — Progress Notes (Addendum)
This patient presents the office with chief complaint of continued pain noted from the callus on both feet. She states that the callus becomes very painful walking and wearing her shoes. She says she has  had previous surgery in an attempt to eliminate her pain but her pain persists.  She now says that she presents the office routinely for treatment of her painful calluses on both feet   GENERAL APPEARANCE: Alert, conversant. Appropriately groomed. No acute distress.  VASCULAR: Pedal pulses are  palpable at  DP and PT bilateral.  Capillary refill time is immediate to all digits,  Normal temperature gradient.   NEUROLOGIC: sensation is normal to 5.07 monofilament at 5/5 sites bilateral.  Light touch is intact bilateral, Muscle strength normal.  MUSCULOSKELETAL: acceptable muscle strength, tone and stability bilateral.  Intrinsic muscluature intact bilateral.  Rectus appearance of foot and digits noted bilateral. Heloma durum 5th toe left foot.  Painful distal clavi second toe right foot.  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  DERMATOLOGIC: skin color, texture, and turgor are within normal limits.  No preulcerative lesions or ulcers  are seen, no interdigital maceration noted.  No open lesions present.  Digital nails are asymptomatic. No drainage noted.Porokeratosis sub 3,5 right  foot and 4 left foot. Pinch callus right foot.  Distal clavi second right.  Porokeratosis  B/l  Onychomycosis  B/L  Debridement of porokeratosis  B/L.  Debridement of onychomycosis  B/L.   RTC 10 weeks.  ABN signed for 2019.  Ayvion Kavanagh DPM  

## 2017-04-05 DIAGNOSIS — M545 Low back pain: Secondary | ICD-10-CM | POA: Diagnosis not present

## 2017-04-05 DIAGNOSIS — G894 Chronic pain syndrome: Secondary | ICD-10-CM | POA: Diagnosis not present

## 2017-04-05 DIAGNOSIS — Z79891 Long term (current) use of opiate analgesic: Secondary | ICD-10-CM | POA: Diagnosis not present

## 2017-04-16 DIAGNOSIS — R7303 Prediabetes: Secondary | ICD-10-CM | POA: Insufficient documentation

## 2017-04-16 NOTE — Assessment & Plan Note (Signed)
a1c in 2017 5.8 - normal last time it was checked Check a1c Low sugar / carb diet Stressed regular exercise, weight loss

## 2017-04-16 NOTE — Progress Notes (Signed)
Subjective:    Patient ID: Misty [name], female    DOB: January 05, 1961, 57 y.o.   MRN: [id number]  HPI The patient is here for follow up.  Hypertension: She is taking her medication daily. She is compliant with a low sodium diet.  She denies chest pain, palpitations, shortness of breath and regular headaches. She is exercising regularly.  She does monitor her blood pressure at home - it has been high.    Chronic lower back pain:   She is following with pain management.    Prediabetes:  She is compliant with a low sugar/carbohydrate diet.  She is exercising.  Right groin pain:  She has been having right groin pain for 2.5 months.  She is walking with a cane due to the pain affecting her balacne.  The leg feels weak and sometimes when she puts pressure on the right leg it gives out on her.  She is afraid of falling.  The pain is worse with putting pressure on it and walking.  Pain is better with rest.  Her gabapentin was increased.  She is taking her chronic pain medication.  B12 deficiency: She has B12 deficiency secondary to gastric bypass.  She has been getting B12 injections on a monthly basis and is due for one today.  Medications and allergies reviewed with patient and updated if appropriate.  Patient Active Problem List   Diagnosis Date Noted  . Prediabetes 04/16/2017  . Arm pain, left 01/29/2017  . Numbness 01/29/2017  . Burn 12/14/2016  . Chronic midline low back pain 07/20/2016  . Sacral mass 07/20/2016  . Osteoarthritis of right hip 07/20/2016  . Spinal stenosis of lumbar region 11/29/2015  . Positive urine drug screen 11/16/2015  . Chronic pain syndrome 10/12/2015  . Umbilical hernia 23/53/6144  . Sleep difficulties 07/15/2015  . Bunion, right 06/20/2015  . Callus of foot 04/27/2015  . Muscle spasm 04/16/2015  . B12 deficiency 03/30/2015  . Hammer toe of left foot 02/16/2015  . Fibrosis of skin of lower extremity 02/16/2015  . Status post right foot surgery  10/26/2014  . Cervical spondylosis with radiculopathy 03/01/2014  . Hammer toe of right foot 01/26/2014  . Benign intracranial hypertension 06/18/2013  . Pain in lower limb 05/22/2013  . Abnormal gait 04/21/2013  . Arthritis 04/21/2013  . Disturbance of skin sensation 04/21/2013  . Cephalalgia 04/21/2013  . Porokeratosis 03/31/2013  . Pain, foot 03/31/2013  . Vitamin D deficiency 04/18/2010  . Fatigue 04/14/2010  . Back pain with right-sided radiculopathy 11/29/2008  . MEDIAL MENISCUS TEAR, LEFT 06/30/2008  . BARRETTS ESOPHAGUS 06/28/2008  . OSTEOARTHRITIS, KNEES, BILATERAL 06/28/2008  . OBESITY 05/24/2008  . HYPERTENSION, BENIGN ESSENTIAL 05/24/2008  . IRRITABLE BOWEL SYNDROME 05/24/2008  . BARIATRIC SURGERY STATUS 03/19/2006  . NEOPLASM, MALIGNANT, COLON, HX OF 03/19/1997    Current Outpatient Medications on File Prior to Visit  Medication Sig Dispense Refill  . baclofen (LIORESAL) 10 MG tablet     . Cyanocobalamin (VITAMIN B-12 IJ) Inject as directed.    . gabapentin (NEURONTIN) 300 MG capsule One po qAM, two po q PM and 2 po q HS 150 capsule 5  . ibuprofen (ADVIL,MOTRIN) 200 MG tablet Take 200 mg by mouth every 6 (six) hours as needed.    Marland Kitchen lisinopril (PRINIVIL,ZESTRIL) 20 MG tablet     . naproxen sodium (ANAPROX) 220 MG tablet Take 220 mg by mouth 2 (two) times daily with a meal.    . Oxycodone HCl 10  MG TABS     . SSD 1 % cream     . VENTOLIN HFA 108 (90 Base) MCG/ACT inhaler     . XTAMPZA ER 9 MG C12A     . hydrochlorothiazide (MICROZIDE) 12.5 MG capsule      No current facility-administered medications on file prior to visit.     Past Medical History:  Diagnosis Date  . Allergy   . Arthritis   . Back pain   . Colon cancer ([place])   . Depression   . Diarrhea   . GERD (gastroesophageal reflux disease)   . Hypertension   . Insomnia   . MS (multiple sclerosis) ([place])   . Other hammer toe (acquired) 03/31/2013  . Pneumonia    hx of  . Umbilical hernia      Past Surgical History:  Procedure Laterality Date  . ABDOMINAL HYSTERECTOMY    . ANTERIOR CERVICAL DECOMP/DISCECTOMY FUSION N/A 03/01/2014   Procedure: ANTERIOR CERVICAL DECOMPRESSION/DISCECTOMY FUSION 1 LEVEL;  Surgeon: Charlie Pitter, MD;  Location: [facility];  Service: Neurosurgery;  Laterality: N/A;  ANTERIOR CERVICAL DECOMPRESSION/DISCECTOMY FUSION 1 LEVEL  . BUNIONECTOMY Right 10/14/2014   @[facility]   . CESAREAN SECTION    . COLON SURGERY    . COLONOSCOPY    . FOOT SURGERY Right   . GASTRIC BYPASS    . Hammer Toe Repair Right 10/14/2014   RT #2, @[facility]   . TENOTOMY Right 10/14/2014   RT #3, @[facility]     Social History   Socioeconomic History  . Marital status: Single    Spouse name: Not on file  . Number of children: Not on file  . Years of education: Not on file  . Highest education level: Not on file  Social Needs  . Financial resource strain: Not on file  . Food insecurity - worry: Not on file  . Food insecurity - inability: Not on file  . Transportation needs - medical: Not on file  . Transportation needs - non-medical: Not on file  Occupational History  . Not on file  Tobacco Use  . Smoking status: Current Some Day Smoker    Packs/day: 0.25    Years: 20.00    Pack years: 5.00    Types: Cigarettes  . Smokeless tobacco: Never Used  Substance and Sexual Activity  . Alcohol use: Yes    Alcohol/week: 0.0 oz    Comment: 1/2 pint daily  . Drug use: No  . Sexual activity: Yes    Partners: Male  Other Topics Concern  . Not on file  Social History Narrative  . Not on file    Family History  Problem Relation Age of Onset  . Stroke Mother   . Hypertension Mother   . Hyperlipidemia Mother   . Diabetes Mother   . Diabetes Brother   . Hypertension Brother   . Hypertension Brother   . Hypertension Brother   . Hypertension Brother   . Hypertension Brother   . Alcoholism Brother     Review of Systems  Constitutional: Negative for chills and fever.  Respiratory:  Negative for cough, shortness of breath and wheezing.   Cardiovascular: Positive for leg swelling (ankle). Negative for chest pain and palpitations.  Endocrine: Positive for cold intolerance.  Musculoskeletal: Positive for back pain and neck pain.  Neurological: Positive for light-headedness (occ depending on activity) and numbness. Negative for headaches.       Objective:   Vitals:   04/17/17 1432  BP: (!) 144/90  Pulse: 78  Resp: 16  Temp: 98.3 F (36.8 C)  SpO2: 97%   Wt Readings from Last 3 Encounters:  04/17/17 237 lb (107.5 kg)  12/18/16 239 lb 8 oz (108.6 kg)  12/14/16 236 lb (107 kg)   Body mass index is 38.25 kg/m.   Physical Exam    Constitutional: Appears well-developed and well-nourished. No distress.  HENT:  Head: Normocephalic and atraumatic.  Neck: Neck supple. No tracheal deviation present. No thyromegaly present.  No cervical lymphadenopathy Cardiovascular: Normal rate, regular rhythm and normal heart sounds.   No murmur heard. No carotid bruit .  No edema Pulmonary/Chest: Effort normal and breath sounds normal. No respiratory distress. No has no wheezes. No rales.  Msk: no lateral hip pain Skin: Skin is warm and dry. Not diaphoretic.  Psychiatric: Normal mood and affect. Behavior is normal.      Assessment & Plan:    See Problem List for Assessment and Plan of chronic medical problems.

## 2017-04-16 NOTE — Patient Instructions (Addendum)
  Test(s) ordered today. Your results will be released to [facility] (or called to you) after review, usually within 72hours after test completion. If any changes need to be made, you will be notified at that same time.  Flu immunization administered today.  A B12 injection was given today.   Medications reviewed and updated.  Changes include restart hctz 12. 5 mg daily  Your prescription(s) have been submitted to your pharmacy. Please take as directed and contact our office if you believe you are having problem(s) with the medication(s).  A referral was ordered for Dr Tamala Julian, sports medicine  Please followup in  6 months

## 2017-04-17 ENCOUNTER — Ambulatory Visit (INDEPENDENT_AMBULATORY_CARE_PROVIDER_SITE_OTHER): Payer: Medicare Other | Admitting: Internal Medicine

## 2017-04-17 ENCOUNTER — Other Ambulatory Visit (INDEPENDENT_AMBULATORY_CARE_PROVIDER_SITE_OTHER): Payer: Medicare Other

## 2017-04-17 VITALS — BP 144/90 | HR 78 | Temp 98.3°F | Resp 16 | Wt 237.0 lb

## 2017-04-17 DIAGNOSIS — R1031 Right lower quadrant pain: Secondary | ICD-10-CM | POA: Diagnosis not present

## 2017-04-17 DIAGNOSIS — I10 Essential (primary) hypertension: Secondary | ICD-10-CM | POA: Diagnosis not present

## 2017-04-17 DIAGNOSIS — Z23 Encounter for immunization: Secondary | ICD-10-CM | POA: Diagnosis not present

## 2017-04-17 DIAGNOSIS — R7303 Prediabetes: Secondary | ICD-10-CM | POA: Diagnosis not present

## 2017-04-17 DIAGNOSIS — E538 Deficiency of other specified B group vitamins: Secondary | ICD-10-CM

## 2017-04-17 LAB — HEMOGLOBIN A1C: Hgb A1c MFr Bld: 5.7 % (ref 4.6–6.5)

## 2017-04-17 LAB — COMPREHENSIVE METABOLIC PANEL
ALT: 15 U/L (ref 0–35)
AST: 21 U/L (ref 0–37)
Albumin: 3.9 g/dL (ref 3.5–5.2)
Alkaline Phosphatase: 88 U/L (ref 39–117)
BUN: 18 mg/dL (ref 6–23)
CO2: 30 mEq/L (ref 19–32)
Calcium: 9.7 mg/dL (ref 8.4–10.5)
Chloride: 103 mEq/L (ref 96–112)
Creatinine, Ser: 0.88 mg/dL (ref 0.40–1.20)
GFR: 85.48 mL/min (ref >60.00)
Glucose, Bld: 108 mg/dL — ABNORMAL HIGH (ref 70–99)
Potassium: 4.5 mEq/L (ref 3.5–5.1)
Sodium: 140 mEq/L (ref 135–145)
Total Bilirubin: 0.6 mg/dL (ref 0.2–1.2)
Total Protein: 7.9 g/dL (ref 6.0–8.3)

## 2017-04-17 MED ORDER — HYDROCHLOROTHIAZIDE 12.5 MG PO CAPS: 12.5000 mg | ORAL_CAPSULE | Freq: Every day | ORAL | 3 refills | Status: DC

## 2017-04-17 MED ORDER — CYANOCOBALAMIN 1000 MCG/ML IJ SOLN
1000.0000 ug | Freq: Once | INTRAMUSCULAR | Status: AC
  Administered 2017-04-17: 1000 ug via INTRAMUSCULAR

## 2017-04-17 NOTE — Assessment & Plan Note (Addendum)
BP high Restart hctz 12.5 mg dialy Continue lisinopril 20 mg daily cmp after being on the hydrochlorothiazide for a week or so since she did have a low potassium previously

## 2017-04-17 NOTE — Assessment & Plan Note (Signed)
It started 2-1/2 months ago without injury or accident Possible hip arthritis Already on pain medication Continue to use a cane Will refer to Dr. Tamala Julian for further evaluation and treatment

## 2017-04-17 NOTE — Assessment & Plan Note (Signed)
B12 injection today Continue monthly injections 

## 2017-04-18 ENCOUNTER — Other Ambulatory Visit: Payer: Self-pay | Admitting: Emergency Medicine

## 2017-04-18 DIAGNOSIS — I10 Essential (primary) hypertension: Secondary | ICD-10-CM

## 2017-04-29 NOTE — Progress Notes (Signed)
[name] [place], [place] [address] Phone: [phone] Subjective:    I'm seeing this patient by the request  of:  [name], MD   CC: Right groin pain  WCB:[id number]  Misty [name] is a 57 y.o. female coming in with complaint of right groin pain.  Has had this for quite some time.  Patient has had difficulty with her back previously.  Has recently been in formal physical therapy again in October of last year. She states that she has been having pain so badly that she cannot place full weight on that leg. She thus ambulates with a cane. Her pain has gotten worse over the past 2 months. Pain is intermittent. She has been using oxy for her pain once a day. She does take gabapentin at night. She is unable to take on daily as she becomes sleepy.    Recent MRI of the lumbar spine was May 2018.  Found to have a compression fracture at L2 and moderate spinal stenosis at L3-L4 severe spinal stenosis at L4-L5.  She also has a known atypical hemangioma at S3 vertebrae.  Patient was seen in pain clinic previously and did not test positive for alcohol also patient is no longer getting anything stronger than tramadol.  Past Medical History:  Diagnosis Date  . Allergy   . Arthritis   . Back pain   . Colon cancer ([place])   . Depression   . Diarrhea   . GERD (gastroesophageal reflux disease)   . Hypertension   . Insomnia   . MS (multiple sclerosis) ([place])   . Other hammer toe (acquired) 03/31/2013  . Pneumonia    hx of  . Umbilical hernia    Past Surgical History:  Procedure Laterality Date  . ABDOMINAL HYSTERECTOMY    . ANTERIOR CERVICAL DECOMP/DISCECTOMY FUSION N/A 03/01/2014   Procedure: ANTERIOR CERVICAL DECOMPRESSION/DISCECTOMY FUSION 1 LEVEL;  Surgeon: Charlie Pitter, MD;  Location: [facility];  Service: Neurosurgery;  Laterality: N/A;  ANTERIOR CERVICAL DECOMPRESSION/DISCECTOMY FUSION 1 LEVEL  . BUNIONECTOMY Right 10/14/2014   @[facility]     . CESAREAN SECTION    . COLON SURGERY    . COLONOSCOPY    . FOOT SURGERY Right   . GASTRIC BYPASS    . Hammer Toe Repair Right 10/14/2014   RT #2, @[facility]   . TENOTOMY Right 10/14/2014   RT #3, @[facility]    Social History   Socioeconomic History  . Marital status: Single    Spouse name: None  . Number of children: None  . Years of education: None  . Highest education level: None  Social Needs  . Financial resource strain: None  . Food insecurity - worry: None  . Food insecurity - inability: None  . Transportation needs - medical: None  . Transportation needs - non-medical: None  Occupational History  . None  Tobacco Use  . Smoking status: Current Some Day Smoker    Packs/day: 0.25    Years: 20.00    Pack years: 5.00    Types: Cigarettes  . Smokeless tobacco: Never Used  Substance and Sexual Activity  . Alcohol use: Yes    Alcohol/week: 0.0 oz    Comment: 1/2 pint daily  . Drug use: No  . Sexual activity: Yes    Partners: Male  Other Topics Concern  . None  Social History Narrative  . None   Allergies  Allergen Reactions  . Phenergan [Promethazine Hcl] Anxiety  .  Promethazine Anxiety  . Trazodone And Nefazodone Rash   Family History  Problem Relation Age of Onset  . Stroke Mother   . Hypertension Mother   . Hyperlipidemia Mother   . Diabetes Mother   . Diabetes Brother   . Hypertension Brother   . Hypertension Brother   . Hypertension Brother   . Hypertension Brother   . Hypertension Brother   . Alcoholism Brother      Past medical history, social, surgical and family history all reviewed in electronic medical record.  No pertanent information unless stated regarding to the chief complaint.   Review of Systems:Review of systems updated and as accurate as of 04/30/17  No headache, visual changes, nausea, vomiting, diarrhea, constipation, dizziness, abdominal pain, skin rash, fevers, chills, night sweats, weight loss, swollen lymph nodes, body aches, joint  swelling, chest pain, shortness of breath, mood changes.  Positive muscle aches  Objective  Blood pressure 128/70, pulse 95, height 5' 5.5" (1.664 m), weight 235 lb (106.6 kg), SpO2 99 %. Systems examined below as of 04/30/17   General: No apparent distress alert and oriented x3 mood and affect normal, dressed appropriately.  HEENT: Pupils equal, extraocular movements intact  Respiratory: Patient's speak in full sentences and does not appear short of breath  Cardiovascular: No lower extremity edema, non tender, no erythema  Skin: Warm dry intact with no signs of infection or rash on extremities or on axial skeleton.  Abdomen: Soft nontender  Neuro: Cranial nerves II through XII are intact, neurovascularly intact in all extremities with 2+ DTRs and 2+ pulses.  Lymph: No lymphadenopathy of posterior or anterior cervical chain or axillae bilaterally.  Gait antalgic gait MSK:  Non tender with full range of motion and good stability and symmetric strength and tone of shoulders, elbows, wrist, and ankles bilaterally.  Knee exam shows a valgus deformity with likely underlying arthritis  Right hip exam shows the patient does have limited range of motion in all planes.  Patient does have some pain with internal rotation in the groin severely tender over the greater trochanteric bursa.  Back exam shows significant loss of lordosis.  Mild positive straight leg test on the right   Procedure: Real-time Ultrasound Guided Injection of right greater trochanteric bursitis secondary to patient's body habitus Device: GE Logiq Q7 Ultrasound guided injection is preferred based studies that show increased duration, increased effect, greater accuracy, decreased procedural pain, increased response rate, and decreased cost with ultrasound guided versus blind injection.  Verbal informed consent obtained.  Time-out conducted.  Noted no overlying erythema, induration, or other signs of local infection.  Skin prepped  in a sterile fashion.  Local anesthesia: Topical Ethyl chloride.  With sterile technique and under real time ultrasound guidance:  Greater trochanteric area was visualized and patient's bursa was noted. A 22-gauge 3 inch needle was inserted and 4 cc of 0.5% Marcaine and 1 cc of Kenalog 40 mg/dL was injected. Pictures taken Completed without difficulty  Pain immediately resolved suggesting accurate placement of the medication.  Advised to call if fevers/chills, erythema, induration, drainage, or persistent bleeding.  Images permanently stored and available for review in the ultrasound unit.  Impression: Technically successful ultrasound guided injection.   Impression and Recommendations:     This case required medical decision making of moderate complexity.      Note: This dictation was prepared with Dragon dictation along with smaller phrase technology. Any transcriptional errors that result from this process are unintentional.

## 2017-04-30 ENCOUNTER — Encounter: Payer: Self-pay | Admitting: Family Medicine

## 2017-04-30 ENCOUNTER — Other Ambulatory Visit (INDEPENDENT_AMBULATORY_CARE_PROVIDER_SITE_OTHER): Payer: Medicare Other

## 2017-04-30 ENCOUNTER — Ambulatory Visit (INDEPENDENT_AMBULATORY_CARE_PROVIDER_SITE_OTHER)
Admission: RE | Admit: 2017-04-30 | Discharge: 2017-04-30 | Disposition: A | Discharge status: Home / Self Care | Payer: Medicare Other | Source: Ambulatory Visit | Attending: Family Medicine | Admitting: Family Medicine

## 2017-04-30 ENCOUNTER — Ambulatory Visit (INDEPENDENT_AMBULATORY_CARE_PROVIDER_SITE_OTHER): Payer: Medicare Other | Admitting: Family Medicine

## 2017-04-30 ENCOUNTER — Ambulatory Visit: Payer: Self-pay

## 2017-04-30 VITALS — BP 128/70 | HR 95 | Ht 65.5 in | Wt 235.0 lb

## 2017-04-30 DIAGNOSIS — M7061 Trochanteric bursitis, right hip: Secondary | ICD-10-CM | POA: Insufficient documentation

## 2017-04-30 DIAGNOSIS — M25361 Other instability, right knee: Secondary | ICD-10-CM

## 2017-04-30 DIAGNOSIS — M25551 Pain in right hip: Secondary | ICD-10-CM

## 2017-04-30 DIAGNOSIS — I10 Essential (primary) hypertension: Secondary | ICD-10-CM

## 2017-04-30 DIAGNOSIS — M48062 Spinal stenosis, lumbar region with neurogenic claudication: Secondary | ICD-10-CM | POA: Diagnosis not present

## 2017-04-30 DIAGNOSIS — M1611 Unilateral primary osteoarthritis, right hip: Secondary | ICD-10-CM | POA: Diagnosis not present

## 2017-04-30 DIAGNOSIS — M179 Osteoarthritis of knee, unspecified: Secondary | ICD-10-CM | POA: Diagnosis not present

## 2017-04-30 LAB — COMPREHENSIVE METABOLIC PANEL
ALT: 15 U/L (ref 0–35)
AST: 20 U/L (ref 0–37)
Albumin: 4 g/dL (ref 3.5–5.2)
Alkaline Phosphatase: 98 U/L (ref 39–117)
BUN: 22 mg/dL (ref 6–23)
CO2: 32 mEq/L (ref 19–32)
Calcium: 9.6 mg/dL (ref 8.4–10.5)
Chloride: 102 mEq/L (ref 96–112)
Creatinine, Ser: 0.95 mg/dL (ref 0.40–1.20)
GFR: 78.24 mL/min (ref >60.00)
Glucose, Bld: 106 mg/dL — ABNORMAL HIGH (ref 70–99)
Potassium: 4.4 mEq/L (ref 3.5–5.1)
Sodium: 138 mEq/L (ref 135–145)
Total Bilirubin: 0.7 mg/dL (ref 0.2–1.2)
Total Protein: 7.6 g/dL (ref 6.0–8.3)

## 2017-04-30 MED ORDER — VITAMIN D (ERGOCALCIFEROL) 1.25 MG (50000 UNIT) PO CAPS: 50000.0000 [IU] | ORAL_CAPSULE | ORAL | 0 refills | Status: DC

## 2017-04-30 NOTE — Patient Instructions (Addendum)
Good to see you  Ice 20 minutes 2 times daily. Usually after activity and before bed. Exercises 3 times a week.  pennsaid pinkie amount topically 2 times daily as needed.  Once weekly vitamin D for 12 weeks Continue your other meds We may need to consider epidural in your back.  See me again in 4 weeks

## 2017-04-30 NOTE — Assessment & Plan Note (Signed)
Patient does have more of a spinal stenosis of the lumbar spine possibly injections which patient has declined at this moment.  Patient tried a greater enteric injection.

## 2017-04-30 NOTE — Assessment & Plan Note (Signed)
Patient given injection.  Tolerated procedure well.  We discussed icing regimen and home exercises.  Discussed which activities to do which wants to avoid.  Topical anti-inflammatories and home exercises given.  Follow-up again in 4 weeks

## 2017-05-03 DIAGNOSIS — Z79891 Long term (current) use of opiate analgesic: Secondary | ICD-10-CM | POA: Diagnosis not present

## 2017-05-03 DIAGNOSIS — G894 Chronic pain syndrome: Secondary | ICD-10-CM | POA: Diagnosis not present

## 2017-05-03 DIAGNOSIS — M545 Low back pain: Secondary | ICD-10-CM | POA: Diagnosis not present

## 2017-05-23 ENCOUNTER — Ambulatory Visit (INDEPENDENT_AMBULATORY_CARE_PROVIDER_SITE_OTHER): Payer: Medicare Other | Admitting: *Deleted

## 2017-05-23 DIAGNOSIS — E538 Deficiency of other specified B group vitamins: Secondary | ICD-10-CM | POA: Diagnosis not present

## 2017-05-23 MED ORDER — CYANOCOBALAMIN 1000 MCG/ML IJ SOLN
1000.0000 ug | Freq: Once | INTRAMUSCULAR | Status: AC
  Administered 2017-05-23: 1000 ug via INTRAMUSCULAR

## 2017-05-24 ENCOUNTER — Ambulatory Visit: Payer: Medicare Other

## 2017-05-27 NOTE — Progress Notes (Deleted)
[name] [place], [place] [address] Phone: [phone] Subjective:    I'm seeing this patient by the request  of:    CC: Knee pain follow-up, back pain follow-up  [id number]  Misty [name] is a 57 y.o. female coming in with complaint of back pain.  Patient did have an MRI in 2018.  Showed a compression fracture at L2 is mild to moderate spinal stenosis at L3-L4 and severe spinal stenosis at L4-L5.  Atypical hemangioma at S3 vertebrae.  Patient declined an epidural but was given a greater trochanteric injection back in April 30, 2017.  Patient was to continue with once weekly vitamin D, topical anti-inflammatories and home exercises.  Patient states  Patient was also found to have severe knee arthritis.  Patient was given an injection of the same date.  Patient states    Past Medical History:  Diagnosis Date  . Allergy   . Arthritis   . Back pain   . Colon cancer ([place])   . Depression   . Diarrhea   . GERD (gastroesophageal reflux disease)   . Hypertension   . Insomnia   . MS (multiple sclerosis) ([place])   . Other hammer toe (acquired) 03/31/2013  . Pneumonia    hx of  . Umbilical hernia    Past Surgical History:  Procedure Laterality Date  . ABDOMINAL HYSTERECTOMY    . ANTERIOR CERVICAL DECOMP/DISCECTOMY FUSION N/A 03/01/2014   Procedure: ANTERIOR CERVICAL DECOMPRESSION/DISCECTOMY FUSION 1 LEVEL;  Surgeon: Charlie Pitter, MD;  Location: [facility];  Service: Neurosurgery;  Laterality: N/A;  ANTERIOR CERVICAL DECOMPRESSION/DISCECTOMY FUSION 1 LEVEL  . BUNIONECTOMY Right 10/14/2014   @[facility]   . CESAREAN SECTION    . COLON SURGERY    . COLONOSCOPY    . FOOT SURGERY Right   . GASTRIC BYPASS    . Hammer Toe Repair Right 10/14/2014   RT #2, @[facility]   . TENOTOMY Right 10/14/2014   RT #3, @[facility]    Social History   Socioeconomic History  . Marital status: Single    Spouse name: Not on file  . Number of children: Not on file  .  Years of education: Not on file  . Highest education level: Not on file  Social Needs  . Financial resource strain: Not on file  . Food insecurity - worry: Not on file  . Food insecurity - inability: Not on file  . Transportation needs - medical: Not on file  . Transportation needs - non-medical: Not on file  Occupational History  . Not on file  Tobacco Use  . Smoking status: Current Some Day Smoker    Packs/day: 0.25    Years: 20.00    Pack years: 5.00    Types: Cigarettes  . Smokeless tobacco: Never Used  Substance and Sexual Activity  . Alcohol use: Yes    Alcohol/week: 0.0 oz    Comment: 1/2 pint daily  . Drug use: No  . Sexual activity: Yes    Partners: Male  Other Topics Concern  . Not on file  Social History Narrative  . Not on file   Allergies  Allergen Reactions  . Phenergan [Promethazine Hcl] Anxiety  . Promethazine Anxiety  . Trazodone And Nefazodone Rash   Family History  Problem Relation Age of Onset  . Stroke Mother   . Hypertension Mother   . Hyperlipidemia Mother   . Diabetes Mother   . Diabetes Brother   . Hypertension Brother   .  Hypertension Brother   . Hypertension Brother   . Hypertension Brother   . Hypertension Brother   . Alcoholism Brother      Past medical history, social, surgical and family history all reviewed in electronic medical record.  No pertanent information unless stated regarding to the chief complaint.   Review of Systems:Review of systems updated and as accurate as of 05/27/17  No headache, visual changes, nausea, vomiting, diarrhea, constipation, dizziness, abdominal pain, skin rash, fevers, chills, night sweats, weight loss, swollen lymph nodes, body aches, joint swelling, muscle aches, chest pain, shortness of breath, mood changes.   Objective  There were no vitals taken for this visit. Systems examined below as of 05/27/17   General: No apparent distress alert and oriented x3 mood and affect normal, dressed  appropriately.  HEENT: Pupils equal, extraocular movements intact  Respiratory: Patient's speak in full sentences and does not appear short of breath  Cardiovascular: No lower extremity edema, non tender, no erythema  Skin: Warm dry intact with no signs of infection or rash on extremities or on axial skeleton.  Abdomen: Soft nontender  Neuro: Cranial nerves II through XII are intact, neurovascularly intact in all extremities with 2+ DTRs and 2+ pulses.  Lymph: No lymphadenopathy of posterior or anterior cervical chain or axillae bilaterally.  Gait normal with good balance and coordination.  MSK:  Non tender with full range of motion and good stability and symmetric strength and tone of shoulders, elbows, wrist, hip, knee and ankles bilaterally.     Impression and Recommendations:     This case required medical decision making of moderate complexity.      Note: This dictation was prepared with Dragon dictation along with smaller phrase technology. Any transcriptional errors that result from this process are unintentional.

## 2017-05-28 ENCOUNTER — Ambulatory Visit: Payer: Medicare Other | Admitting: Family Medicine

## 2017-05-31 DIAGNOSIS — M545 Low back pain: Secondary | ICD-10-CM | POA: Diagnosis not present

## 2017-05-31 DIAGNOSIS — G894 Chronic pain syndrome: Secondary | ICD-10-CM | POA: Diagnosis not present

## 2017-05-31 DIAGNOSIS — Z79891 Long term (current) use of opiate analgesic: Secondary | ICD-10-CM | POA: Diagnosis not present

## 2017-06-12 ENCOUNTER — Ambulatory Visit: Payer: Medicare Other | Admitting: Podiatry

## 2017-06-18 ENCOUNTER — Ambulatory Visit (INDEPENDENT_AMBULATORY_CARE_PROVIDER_SITE_OTHER): Payer: Medicare Other | Admitting: Podiatry

## 2017-06-18 ENCOUNTER — Encounter: Payer: Self-pay | Admitting: Podiatry

## 2017-06-18 DIAGNOSIS — M79674 Pain in right toe(s): Secondary | ICD-10-CM

## 2017-06-18 DIAGNOSIS — Q828 Other specified congenital malformations of skin: Secondary | ICD-10-CM

## 2017-06-18 DIAGNOSIS — M79675 Pain in left toe(s): Secondary | ICD-10-CM | POA: Diagnosis not present

## 2017-06-18 DIAGNOSIS — B351 Tinea unguium: Secondary | ICD-10-CM | POA: Diagnosis not present

## 2017-06-18 DIAGNOSIS — M21611 Bunion of right foot: Secondary | ICD-10-CM

## 2017-06-18 DIAGNOSIS — M21969 Unspecified acquired deformity of unspecified lower leg: Secondary | ICD-10-CM

## 2017-06-18 NOTE — Progress Notes (Signed)
Complaint:  Visit Type: Patient returns to my office for continued preventative foot care services. Complaint: Patient states" my nails have grown long and thick and become painful to walk and wear shoes".  Patient says the callus on the bottom of both feet are very painful.  She stands for hours at work and the pain in the calluses return after 1 week after treatment.  She has history of foot surgery to help improive her painful feet but pain persists.   . The patient presents for preventative foot care services. No changes to ROS  Podiatric Exam: Vascular: dorsalis pedis and posterior tibial pulses are palpable bilateral. Capillary return is immediate. Temperature gradient is WNL. Skin turgor WNL  Sensorium: Normal Semmes Weinstein monofilament test. Normal tactile sensation bilaterally. Nail Exam: Pt has thick disfigured discolored nails with subungual debris noted bilateral entire nail hallux through fifth toenails Ulcer Exam: There is no evidence of ulcer or pre-ulcerative changes or infection. Orthopedic Exam: Muscle tone and strength are WNL. No limitations in general ROM. No crepitus or effusions noted.  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  [place] durum 5th toe left foot.  Hammer toe/mallet toe digits right foot. Skin:  Porokeratosis pinch callus right foot.  sub 3,5 right and sub 4 left foot.   No infection or ulcers  Diagnosis:  Onychomycosis, , Pain in right toe, pain in left toes.  Porokeratosis  B/l.  Treatment & Plan Procedures and Treatment: Consent by patient was obtained for treatment procedures.   Debridement of mycotic and hypertrophic toenails, 1 through 5 bilateral and clearing of subungual debris. No ulceration, no infection noted. Debridement of porokeratosis  B/L after discussing her pathology  I have decided to have her  evaluated by  Liliane Channel  for dispersion padding insoles.   Return Visit-Office Procedure: Patient instructed to return to the office for a follow up visit 3 months for  continued evaluation and treatment.    Gardiner Barefoot DPM

## 2017-06-25 ENCOUNTER — Other Ambulatory Visit: Payer: Medicare Other | Admitting: Orthotics

## 2017-06-26 ENCOUNTER — Ambulatory Visit: Payer: Medicare Other | Admitting: Family Medicine

## 2017-06-26 NOTE — Progress Notes (Signed)
[name] [place], [place] [address] Phone: [phone] Subjective:     CC: Right hip pain  [id number]  Misty [name] is a 57 y.o. female coming in with complaint of right hip pain. Her pain is has been constant for 3 weeks and is now radiating down into her inner thigh. She had an injection and had relief for 2 weeks. She does some exercises which help when she isn't in pain but she is unable to perform the exercises when she is in pain. She feels the exercises sometimes cause the spasms.  Patient states that seems to be on the lateral aspect of the hip more than groin if she had this time.       Past Medical History:  Diagnosis Date  . Allergy   . Arthritis   . Back pain   . Colon cancer ([facility] [facility])   . Depression   . Diarrhea   . GERD (gastroesophageal reflux disease)   . Hypertension   . Insomnia   . MS (multiple sclerosis) ([place])   . Other hammer toe (acquired) 03/31/2013  . Pneumonia    hx of  . Umbilical hernia    Past Surgical History:  Procedure Laterality Date  . ABDOMINAL HYSTERECTOMY    . ANTERIOR CERVICAL DECOMP/DISCECTOMY FUSION N/A 03/01/2014   Procedure: ANTERIOR CERVICAL DECOMPRESSION/DISCECTOMY FUSION 1 LEVEL;  Surgeon: Charlie Pitter, MD;  Location: [facility];  Service: Neurosurgery;  Laterality: N/A;  ANTERIOR CERVICAL DECOMPRESSION/DISCECTOMY FUSION 1 LEVEL  . BUNIONECTOMY Right 10/14/2014   @[facility]   . CESAREAN SECTION    . COLON SURGERY    . COLONOSCOPY    . FOOT SURGERY Right   . GASTRIC BYPASS    . Hammer Toe Repair Right 10/14/2014   RT #2, @[facility]   . TENOTOMY Right 10/14/2014   RT #3, @[facility]    Social History   Socioeconomic History  . Marital status: Single    Spouse name: Not on file  . Number of children: Not on file  . Years of education: Not on file  . Highest education level: Not on file  Occupational History  . Not on file  Social Needs  . Financial resource strain: Not on file  . Food  insecurity:    Worry: Not on file    Inability: Not on file  . Transportation needs:    Medical: Not on file    Non-medical: Not on file  Tobacco Use  . Smoking status: Current Some Day Smoker    Packs/day: 0.25    Years: 20.00    Pack years: 5.00    Types: Cigarettes  . Smokeless tobacco: Never Used  Substance and Sexual Activity  . Alcohol use: Yes    Alcohol/week: 0.0 oz    Comment: 1/2 pint daily  . Drug use: No  . Sexual activity: Yes    Partners: Male  Lifestyle  . Physical activity:    Days per week: Not on file    Minutes per session: Not on file  . Stress: Not on file  Relationships  . Social connections:    Talks on phone: Not on file    Gets together: Not on file    Attends religious service: Not on file    Active member of club or organization: Not on file    Attends meetings of clubs or organizations: Not on file    Relationship status: Not on file  Other Topics Concern  . Not  on file  Social History Narrative  . Not on file   Allergies  Allergen Reactions  . Phenergan [Promethazine Hcl] Anxiety  . Promethazine Anxiety  . Trazodone And Nefazodone Rash   Family History  Problem Relation Age of Onset  . Stroke Mother   . Hypertension Mother   . Hyperlipidemia Mother   . Diabetes Mother   . Diabetes Brother   . Hypertension Brother   . Hypertension Brother   . Hypertension Brother   . Hypertension Brother   . Hypertension Brother   . Alcoholism Brother      Past medical history, social, surgical and family history all reviewed in electronic medical record.  No pertanent information unless stated regarding to the chief complaint.   Review of Systems:Review of systems updated and as accurate as of 06/27/17  No headache, visual changes, nausea, vomiting, diarrhea, constipation, dizziness, abdominal pain, skin rash, fevers, chills, night sweats, weight loss, swollen lymph nodes, body aches, joint swelling, , chest pain, shortness of breath, mood  changes.  Positive muscle aches  Objective  Blood pressure 110/70, pulse 88, height 5' 5.5" (1.664 m), weight 236 lb (107 kg), SpO2 97 %. Systems examined below as of 06/27/17   General: No apparent distress alert and oriented x3 mood and affect normal, dressed appropriately.  HEENT: Pupils equal, extraocular movements intact  Respiratory: Patient's speak in full sentences and does not appear short of breath  Cardiovascular: No lower extremity edema, non tender, no erythema  Skin: Warm dry intact with no signs of infection or rash on extremities or on axial skeleton.  Abdomen: Soft nontender  Neuro: Cranial nerves II through XII are intact, neurovascularly intact in all extremities with 2+ DTRs and 2+ pulses.  Lymph: No lymphadenopathy of posterior or anterior cervical chain or axillae bilaterally.  Gait severely antalgic gait with patient having external rotation of the right leg MSK:  Non tender with full range of motion and good stability and symmetric strength and tone of shoulders, elbows, wrist, , knee and ankles bilaterally.  Some arthritic changes of multiple joints Hip exam shows severe decreased range of motion in all planes especially with internal range of motion.  Severe tenderness over the right greater trochanteric area.  Negative straight leg test.  Minimal pain over the sacroiliac joint.  Strength is 4 out of 5 but symmetric  After verbal consent patient was prepped in sterile fashion with alcohol swabs. Ethyl chloride used patient was injected with a 22-gauge 3 inch needle into the RIGHT lateral hip in the greater trochanteric area under ultrasound guidance. Picture was taken. Patient had 4 cc of 0.5% Marcaine and 1 cc of Kenalog 40 mg/dL injected. Patient tolerated the procedure well and no blood loss. Pain completely resolved after injection stating proper placement. Post injection instructions given.     Impression and Recommendations:     This case required medical  decision making of moderate complexity.      Note: This dictation was prepared with Dragon dictation along with smaller phrase technology. Any transcriptional errors that result from this process are unintentional.

## 2017-06-27 ENCOUNTER — Ambulatory Visit (INDEPENDENT_AMBULATORY_CARE_PROVIDER_SITE_OTHER): Payer: Medicare Other | Admitting: Family Medicine

## 2017-06-27 ENCOUNTER — Encounter: Payer: Self-pay | Admitting: Family Medicine

## 2017-06-27 VITALS — BP 110/70 | HR 88 | Ht 65.5 in | Wt 236.0 lb

## 2017-06-27 DIAGNOSIS — M7061 Trochanteric bursitis, right hip: Secondary | ICD-10-CM | POA: Diagnosis not present

## 2017-06-27 DIAGNOSIS — M1611 Unilateral primary osteoarthritis, right hip: Secondary | ICD-10-CM | POA: Diagnosis not present

## 2017-06-27 MED ORDER — PREDNISONE 50 MG PO TABS: 50.0000 mg | ORAL_TABLET | Freq: Every day | ORAL | 0 refills | Status: DC

## 2017-06-27 NOTE — Patient Instructions (Addendum)
Good to see you  Misty [name] is your friend.  Stay active.  If still in pain over the weekend start prednisone daily for 5 days  See me again in 1-2 weeks and we will consider injection in the hip joint if still in pain

## 2017-06-27 NOTE — Assessment & Plan Note (Signed)
Injected.  Discussed icing regimen, home exercises, topical anti-inflammatories.  Do feel that there is osteoarthritis of the right hip and may need intra-articular injection.  Patient is in agreement with plan and will continue vitamin D supplementation.  Follow-up again 3 weeks

## 2017-07-01 ENCOUNTER — Ambulatory Visit: Payer: Medicare Other | Admitting: Orthotics

## 2017-07-01 DIAGNOSIS — M21611 Bunion of right foot: Secondary | ICD-10-CM

## 2017-07-01 DIAGNOSIS — M21969 Unspecified acquired deformity of unspecified lower leg: Secondary | ICD-10-CM

## 2017-07-01 DIAGNOSIS — M545 Low back pain: Secondary | ICD-10-CM | POA: Diagnosis not present

## 2017-07-01 DIAGNOSIS — M79674 Pain in right toe(s): Secondary | ICD-10-CM

## 2017-07-01 DIAGNOSIS — G894 Chronic pain syndrome: Secondary | ICD-10-CM | POA: Diagnosis not present

## 2017-07-01 DIAGNOSIS — Z79891 Long term (current) use of opiate analgesic: Secondary | ICD-10-CM | POA: Diagnosis not present

## 2017-07-01 DIAGNOSIS — M79675 Pain in left toe(s): Secondary | ICD-10-CM

## 2017-07-01 NOTE — Progress Notes (Signed)
Patient didn't bring f/o to be modified, nor does she want to pay for new inserts.  She will bring back the old f/o and I will modify them to relieve pain.

## 2017-07-08 ENCOUNTER — Telehealth: Payer: Self-pay | Admitting: *Deleted

## 2017-07-08 ENCOUNTER — Ambulatory Visit: Payer: Medicare Other | Admitting: Orthotics

## 2017-07-08 DIAGNOSIS — M21611 Bunion of right foot: Secondary | ICD-10-CM

## 2017-07-08 DIAGNOSIS — Q828 Other specified congenital malformations of skin: Secondary | ICD-10-CM

## 2017-07-08 NOTE — Progress Notes (Signed)
Patient came in to  Have her old orthoics refurbished to address painful callus build up.  She will come back when she has 90

## 2017-07-08 NOTE — Telephone Encounter (Signed)
Pt states she may have to cancel her appt she can only find one orthotic. Pt left only her name and the message.

## 2017-07-08 NOTE — Progress Notes (Signed)
[name] [place], [place] [address] Phone: [phone] Subjective:     CC: Hip pain.  [id number]  Misty [name] is a 57 y.o. female coming in with complaint of right hip pain. The injection did help for a week but then her pain came back.  Patient's past medical history is significant for multiple sclerosis.  Patient was also diagnosed with more of a greater trochanteric injection and did not do well with this only being 2 weeks.  Having more groin pain.  Patient does have x-rays independently visualized by me showing severe arthritic changes.  Patient states that even daily activities such as walking has become significantly more difficult.  Rates the severity as 9 out of 10.     Past Medical History:  Diagnosis Date  . Allergy   . Arthritis   . Back pain   . Colon cancer ([place])   . Depression   . Diarrhea   . GERD (gastroesophageal reflux disease)   . Hypertension   . Insomnia   . MS (multiple sclerosis) ([place])   . Other hammer toe (acquired) 03/31/2013  . Pneumonia    hx of  . Umbilical hernia    Past Surgical History:  Procedure Laterality Date  . ABDOMINAL HYSTERECTOMY    . ANTERIOR CERVICAL DECOMP/DISCECTOMY FUSION N/A 03/01/2014   Procedure: ANTERIOR CERVICAL DECOMPRESSION/DISCECTOMY FUSION 1 LEVEL;  Surgeon: Charlie Pitter, MD;  Location: [facility];  Service: Neurosurgery;  Laterality: N/A;  ANTERIOR CERVICAL DECOMPRESSION/DISCECTOMY FUSION 1 LEVEL  . BUNIONECTOMY Right 10/14/2014   @[facility]   . CESAREAN SECTION    . COLON SURGERY    . COLONOSCOPY    . FOOT SURGERY Right   . GASTRIC BYPASS    . Hammer Toe Repair Right 10/14/2014   RT #2, @[facility]   . TENOTOMY Right 10/14/2014   RT #3, @[facility]    Social History   Socioeconomic History  . Marital status: Single    Spouse name: Not on file  . Number of children: Not on file  . Years of education: Not on file  . Highest education level: Not on file  Occupational History    . Not on file  Social Needs  . Financial resource strain: Not on file  . Food insecurity:    Worry: Not on file    Inability: Not on file  . Transportation needs:    Medical: Not on file    Non-medical: Not on file  Tobacco Use  . Smoking status: Current Some Day Smoker    Packs/day: 0.25    Years: 20.00    Pack years: 5.00    Types: Cigarettes  . Smokeless tobacco: Never Used  Substance and Sexual Activity  . Alcohol use: Yes    Alcohol/week: 0.0 oz    Comment: 1/2 pint daily  . Drug use: No  . Sexual activity: Yes    Partners: Male  Lifestyle  . Physical activity:    Days per week: Not on file    Minutes per session: Not on file  . Stress: Not on file  Relationships  . Social connections:    Talks on phone: Not on file    Gets together: Not on file    Attends religious service: Not on file    Active member of club or organization: Not on file    Attends meetings of clubs or organizations: Not on file    Relationship status: Not on file  Other Topics  Concern  . Not on file  Social History Narrative  . Not on file   Allergies  Allergen Reactions  . Phenergan [Promethazine Hcl] Anxiety  . Promethazine Anxiety  . Trazodone And Nefazodone Rash   Family History  Problem Relation Age of Onset  . Stroke Mother   . Hypertension Mother   . Hyperlipidemia Mother   . Diabetes Mother   . Diabetes Brother   . Hypertension Brother   . Hypertension Brother   . Hypertension Brother   . Hypertension Brother   . Hypertension Brother   . Alcoholism Brother      Past medical history, social, surgical and family history all reviewed in electronic medical record.  No pertanent information unless stated regarding to the chief complaint.   Review of Systems:Review of systems updated and as accurate as of 07/09/17  No headache, visual changes, nausea, vomiting, diarrhea, constipation, dizziness, abdominal pain, skin rash, fevers, chills, night sweats, weight loss, swollen  lymph nodes, body aches, joint swelling,  chest pain, shortness of breath, mood changes.  Positive muscle aches  Objective  Blood pressure 102/60, pulse (!) 121, height 5' 5.5" (1.664 m), weight 238 lb (108 kg), SpO2 98 %. Systems examined below as of 07/09/17   General: No apparent distress alert and oriented x3 mood and affect normal, dressed appropriately.  HEENT: Pupils equal, extraocular movements intact  Respiratory: Patient's speak in full sentences and does not appear short of breath  Cardiovascular: No lower extremity edema, non tender, no erythema  Skin: Warm dry intact with no signs of infection or rash on extremities or on axial skeleton.  Abdomen: Soft nontender  Neuro: Cranial nerves II through XII are intact, neurovascularly intact in all extremities with 2+ DTRs and 2+ pulses.  Lymph: No lymphadenopathy of posterior or anterior cervical chain or axillae bilaterally.  Gait antalgic gait MSK:  Non tender with full range of motion and good stability and symmetric strength and tone of shoulders, elbows, wrist,  knee and ankles bilaterally.  Right hip exam shows severe decrease in range of motion in all planes.  Mild crepitus noted.  Patient does sit with an external rotated leg.  Pain to palpation in the groin area.  More pain with internal rotation. Procedure: Real-time Ultrasound Guided Injection of right hip Device: GE Logiq Q7  Ultrasound guided injection is preferred based studies that show increased duration, increased effect, greater accuracy, decreased procedural pain, increased response rate with ultrasound guided versus blind injection.  Verbal informed consent obtained.  Time-out conducted.  Noted no overlying erythema, induration, or other signs of local infection.  Skin prepped in a sterile fashion.  Local anesthesia: Topical Ethyl chloride.  With sterile technique and under real time ultrasound guidance:  Anterior capsule visualized, needle visualized going to the  head neck junction at the anterior capsule. Pictures taken. Patient did have injection of  3 cc of 0.5% Marcaine, and 1 cc of Kenalog 40 mg/dL. Completed without difficulty  Pain immediately resolved suggesting accurate placement of the medication.  Advised to call if fevers/chills, erythema, induration, drainage, or persistent bleeding.  Images permanently stored and available for review in the ultrasound unit.  Impression: Technically successful ultrasound guided injection.   Impression and Recommendations:     This case required medical decision making of moderate complexity.      Note: This dictation was prepared with Dragon dictation along with smaller phrase technology. Any transcriptional errors that result from this process are unintentional.

## 2017-07-09 ENCOUNTER — Ambulatory Visit: Payer: Self-pay

## 2017-07-09 ENCOUNTER — Encounter: Payer: Self-pay | Admitting: Family Medicine

## 2017-07-09 ENCOUNTER — Ambulatory Visit: Payer: Medicare Other

## 2017-07-09 ENCOUNTER — Ambulatory Visit (INDEPENDENT_AMBULATORY_CARE_PROVIDER_SITE_OTHER): Payer: Medicare Other | Admitting: Family Medicine

## 2017-07-09 VITALS — BP 102/60 | HR 121 | Ht 65.5 in | Wt 238.0 lb

## 2017-07-09 DIAGNOSIS — M25551 Pain in right hip: Secondary | ICD-10-CM

## 2017-07-09 DIAGNOSIS — E538 Deficiency of other specified B group vitamins: Secondary | ICD-10-CM

## 2017-07-09 DIAGNOSIS — M1611 Unilateral primary osteoarthritis, right hip: Secondary | ICD-10-CM

## 2017-07-09 DIAGNOSIS — G894 Chronic pain syndrome: Secondary | ICD-10-CM

## 2017-07-09 MED ORDER — CYANOCOBALAMIN 1000 MCG/ML IJ SOLN
1000.0000 ug | Freq: Once | INTRAMUSCULAR | Status: AC
Start: 2017-07-09 — End: 2017-07-09
  Administered 2017-07-09: 1000 ug via INTRAMUSCULAR

## 2017-07-09 NOTE — Patient Instructions (Signed)
Good to see you  We tried an injection on the hip joint today  I hope it helps Otherwise you may need to consider a hip replacement.  Send me a message in 2 weeks and tell me how you are doing.  Otherwise can repeat injection in hip every 3 months

## 2017-07-09 NOTE — Assessment & Plan Note (Signed)
Severe.  Attempted intra-articular injection today.  Tolerated the procedure well.  Hopefully this will help some pain for some time.  Encourage patient to consider the possibility of a joint replacement in the near future.  Patient is going to consider that.  Patient will continue with the icing regimen.  Gets chronic pain medications from another provider.  Follow-up with me again in 8-12 weeks.

## 2017-07-09 NOTE — Assessment & Plan Note (Signed)
B12 injection given today. 

## 2017-07-11 ENCOUNTER — Telehealth: Payer: Self-pay | Admitting: Family Medicine

## 2017-07-11 NOTE — Telephone Encounter (Signed)
Copied from [place] [id number]. Topic: Quick Communication - See Telephone Encounter >> Jul 11, 2017  4:01 PM Cleaster Corin, NT wrote: CRM for notification. See Telephone encounter for: 07/11/17.  Pt. Calling needing to speak with Dr. Hulan Saas or nurse to let them know that she is having some pain after the injection in the right growing area she received injection on 07/09/17 [phone] (no relieve and  pull in thigh)

## 2017-07-12 ENCOUNTER — Ambulatory Visit (INDEPENDENT_AMBULATORY_CARE_PROVIDER_SITE_OTHER)
Admission: RE | Admit: 2017-07-12 | Discharge: 2017-07-12 | Disposition: A | Discharge status: Home / Self Care | Payer: Medicare Other | Source: Ambulatory Visit | Attending: Family Medicine | Admitting: Family Medicine

## 2017-07-12 ENCOUNTER — Ambulatory Visit (INDEPENDENT_AMBULATORY_CARE_PROVIDER_SITE_OTHER): Payer: Medicare Other | Admitting: Family Medicine

## 2017-07-12 ENCOUNTER — Other Ambulatory Visit (INDEPENDENT_AMBULATORY_CARE_PROVIDER_SITE_OTHER): Payer: Medicare Other

## 2017-07-12 ENCOUNTER — Encounter: Payer: Self-pay | Admitting: Family Medicine

## 2017-07-12 ENCOUNTER — Telehealth: Payer: Self-pay | Admitting: Family Medicine

## 2017-07-12 ENCOUNTER — Telehealth: Payer: Self-pay | Admitting: Internal Medicine

## 2017-07-12 VITALS — BP 104/58 | HR 105 | Ht 65.5 in | Wt 237.0 lb

## 2017-07-12 DIAGNOSIS — M2550 Pain in unspecified joint: Secondary | ICD-10-CM

## 2017-07-12 DIAGNOSIS — M25551 Pain in right hip: Secondary | ICD-10-CM

## 2017-07-12 DIAGNOSIS — M1611 Unilateral primary osteoarthritis, right hip: Secondary | ICD-10-CM | POA: Diagnosis not present

## 2017-07-12 LAB — CBC WITH DIFFERENTIAL/PLATELET
Basophils Absolute: 0.1 10*3/uL (ref 0.0–0.1)
Basophils Relative: 1 % (ref 0.0–3.0)
Eosinophils Absolute: 0.1 10*3/uL (ref 0.0–0.7)
Eosinophils Relative: 0.6 % (ref 0.0–5.0)
HCT: 39 % (ref 36.0–46.0)
Hemoglobin: 12.7 g/dL (ref 12.0–15.0)
Lymphocytes Relative: 17.2 % (ref 12.0–46.0)
Lymphs Abs: 1.8 10*3/uL (ref 0.7–4.0)
MCHC: 32.6 g/dL (ref 30.0–36.0)
MCV: 95.3 fl (ref 78.0–100.0)
Monocytes Absolute: 0.8 10*3/uL (ref 0.1–1.0)
Monocytes Relative: 7.3 % (ref 3.0–12.0)
Neutro Abs: 7.6 10*3/uL (ref 1.4–7.7)
Neutrophils Relative %: 73.9 % (ref 43.0–77.0)
Platelets: 232 10*3/uL (ref 150.0–400.0)
RBC: 4.09 Mil/uL (ref 3.87–5.11)
RDW: 14.5 % (ref 11.5–15.5)
WBC: 10.3 10*3/uL (ref 4.0–10.5)

## 2017-07-12 LAB — SEDIMENTATION RATE: Sed Rate: 36 mm/hr — ABNORMAL HIGH (ref 0–30)

## 2017-07-12 LAB — C-REACTIVE PROTEIN: CRP: 0.5 mg/dL (ref 0.5–20.0)

## 2017-07-12 MED ORDER — DOXYCYCLINE HYCLATE 100 MG PO TABS: 100.0000 mg | ORAL_TABLET | Freq: Two times a day (BID) | ORAL | 0 refills | Status: AC

## 2017-07-12 MED ORDER — TRAMADOL HCL 50 MG PO TABS: 50.0000 mg | ORAL_TABLET | Freq: Two times a day (BID) | ORAL | 0 refills | Status: AC | PRN

## 2017-07-12 NOTE — Telephone Encounter (Signed)
Spoke with patient. She is having pain that is in her groin area down into the thigh. The pain started the night that she was given the injection. She has had pain since then. Offered patient an appointment at 10:30am today. She said that she might be able to make it in but isn't sure. Put her down for appointment and told patient that if she doesn't make it in that she should go to the ER for evaluation. Patient voices understanding.

## 2017-07-12 NOTE — Progress Notes (Signed)
[name] [place], [place] [address] Phone: [phone] Subjective:      CC: Right hip pain after injection.  LMB:[id number]  Misty [name] is a 57 y.o. female coming in with complaint of right hip pain after injection.  Discussed with patient in great length.  Patient was given an injection intra-articular for the hip arthritis that has been noted.  Patient states that unfortunately 6 hours after the injection started having severely increasing pain and was unable to actually bear weight.  Patient was having severe pain still this morning.  Patient took some of her pain medications but is trying to avoid that.  Patient states that over the course of time it is slowly getting better but continues to have pain medially and more down the thigh than truly in the hip itself.  Patient states that this seems to be a bit different and states that her regular hip pain seems to be a little bit better.  Patient though states that if she sits and does not put any weight on and she can still feel a little bit.  Denies of being pulsatile.  Denies any numbness in the lower extremity.  Denies any swelling of the lower extremity.     Past Medical History:  Diagnosis Date  . Allergy   . Arthritis   . Back pain   . Colon cancer ([place])   . Depression   . Diarrhea   . GERD (gastroesophageal reflux disease)   . Hypertension   . Insomnia   . MS (multiple sclerosis) ([place])   . Other hammer toe (acquired) 03/31/2013  . Pneumonia    hx of  . Umbilical hernia    Past Surgical History:  Procedure Laterality Date  . ABDOMINAL HYSTERECTOMY    . ANTERIOR CERVICAL DECOMP/DISCECTOMY FUSION N/A 03/01/2014   Procedure: ANTERIOR CERVICAL DECOMPRESSION/DISCECTOMY FUSION 1 LEVEL;  Surgeon: Charlie Pitter, MD;  Location: [facility];  Service: Neurosurgery;  Laterality: N/A;  ANTERIOR CERVICAL DECOMPRESSION/DISCECTOMY FUSION 1 LEVEL  . BUNIONECTOMY Right 10/14/2014   @[facility]     . CESAREAN SECTION    . COLON SURGERY    . COLONOSCOPY    . FOOT SURGERY Right   . GASTRIC BYPASS    . Hammer Toe Repair Right 10/14/2014   RT #2, @[facility]   . TENOTOMY Right 10/14/2014   RT #3, @[facility]    Social History   Socioeconomic History  . Marital status: Single    Spouse name: Not on file  . Number of children: Not on file  . Years of education: Not on file  . Highest education level: Not on file  Occupational History  . Not on file  Social Needs  . Financial resource strain: Not on file  . Food insecurity:    Worry: Not on file    Inability: Not on file  . Transportation needs:    Medical: Not on file    Non-medical: Not on file  Tobacco Use  . Smoking status: Current Some Day Smoker    Packs/day: 0.25    Years: 20.00    Pack years: 5.00    Types: Cigarettes  . Smokeless tobacco: Never Used  Substance and Sexual Activity  . Alcohol use: Yes    Alcohol/week: 0.0 oz    Comment: 1/2 pint daily  . Drug use: No  . Sexual activity: Yes    Partners: Male  Lifestyle  . Physical activity:    Days per week:  Not on file    Minutes per session: Not on file  . Stress: Not on file  Relationships  . Social connections:    Talks on phone: Not on file    Gets together: Not on file    Attends religious service: Not on file    Active member of club or organization: Not on file    Attends meetings of clubs or organizations: Not on file    Relationship status: Not on file  Other Topics Concern  . Not on file  Social History Narrative  . Not on file   Allergies  Allergen Reactions  . Phenergan [Promethazine Hcl] Anxiety  . Promethazine Anxiety  . Trazodone And Nefazodone Rash   Family History  Problem Relation Age of Onset  . Stroke Mother   . Hypertension Mother   . Hyperlipidemia Mother   . Diabetes Mother   . Diabetes Brother   . Hypertension Brother   . Hypertension Brother   . Hypertension Brother   . Hypertension Brother   . Hypertension Brother   .  Alcoholism Brother      Past medical history, social, surgical and family history all reviewed in electronic medical record.  No pertanent information unless stated regarding to the chief complaint.   Review of Systems:Review of systems updated and as accurate as of 07/12/17  No headache, visual changes, nausea, vomiting, diarrhea, constipation, dizziness, abdominal pain, skin rash, fevers, chills, night sweats, weight loss, swollen lymph nodes, body aches, joint swelling,  chest pain, shortness of breath, mood changes.  Positive muscle aches  Objective  Blood pressure (!) 104/58, pulse (!) 105, height 5' 5.5" (1.664 m), weight 237 lb (107.5 kg), SpO2 98 %. Systems examined below as of 07/12/17   General: No apparent distress alert and oriented x3 mood and affect normal, dressed appropriately.  HEENT: Pupils equal, extraocular movements intact  Respiratory: Patient's speak in full sentences and does not appear short of breath  Cardiovascular: No lower extremity edema, non tender, no erythema  Skin: Warm dry intact with no signs of infection or rash on extremities or on axial skeleton.  Abdomen: Soft nontender bowel sounds positive in all 4 quadrants Neuro: Cranial nerves II through XII are intact, neurovascularly intact in all extremities with 2+ DTRs and 2+ pulses.  Lymph: No lymphadenopathy of posterior or anterior cervical chain or axillae bilaterally.  Gait severely antalgic favoring the right leg MSK: Mild tender with full range of motion and good stability and symmetric strength and tone of shoulders, elbows, wrist,  knee and ankles bilaterally.  Mild arthritic changes.  Right hip exam shows the patient is an antalgic gait.  Continues to walk with an external range of motion.  Mild enlargement of the thigh noted.  Mild warmness noted.  Patient has severe tenderness medially.  More on the thighs anteriorly in the groin area.  Patient still has limited internal range of motion but this  does not seem to make it worse.  Mild positive straight leg test.  Patient does have back pain that is associated with it.  Neurovascularly intact distally.  Good capillary refill distally.  Deep tendon reflexes and brisk pulses felt.      Impression and Recommendations:     This case required medical decision making of moderate complexity.      Note: This dictation was prepared with Dragon dictation along with smaller phrase technology. Any transcriptional errors that result from this process are unintentional.

## 2017-07-12 NOTE — Telephone Encounter (Signed)
Copied from [place] [id number]. Topic: Quick Communication - Rx Refill/Question >> Jul 12, 2017  4:33 PM Oliver Pila B wrote: Medication: doxycycline (VIBRA-TABS) 100 MG tablet [[id number]]   Pt states the medication above gives her yeast infections and she is needing also to have the Diflucan called in so that she can prevent the yeast infection asap, call pt to advise

## 2017-07-12 NOTE — Patient Instructions (Addendum)
Good to see you  Misty [name] is your friend. Ice 20 minutes 2 times daily. Usually after activity and before bed. Can you do heat 10 minutesbefor eice Doxycycline 2 times a dya for 10 days  Get xray and labs downstairs I am hoping this is a flare We could also get a ultrasound of the hip in a different way to look at the blood flow but you declined.  Tramadol 2 times a day as needed If worsening pain or swelling PLEASE go to the emergency room  See me again 215 on Monday

## 2017-07-12 NOTE — Assessment & Plan Note (Addendum)
Patient given the injection yesterday.  Vitals are stable for patient.  If anything actually improved..  Having some mild more pain over from the back and this can be more radicular symptoms.  Patient though is severely tender to palpation over the thigh itself.  Patient still has limited range of motion of the groin.  No significant fullness and felt in the groin.  Pulses are brisk and neurovascularly intact distally.  Patient is able to bear weight on the.  We discussed that more than likely that this is not flare from the steroid injection.  Differential includes a potential vascular injury though.  Because of this I would like to get x-rays to rule out any type of effusion noted.  We will look at this in great detail.  Laboratory work-up to rule out anything such as an acute infection.  Started on doxycycline.  We discussed the possibility of a Doppler to rule out anything such as a clot but is unable to get this as an outpatient procedure.  Patient would have to go to the emergency room and patient declined to do that at this moment.  Patient feels because she has improved over the course the last 12 hours she would like to continue with conservative therapy at this time.  Discussed with her again though that if any worsening pain I would like her to go to the emergency room for the potential of the complications we discussed.  Tramadol given as well.  Follow-up with me again on Monday.

## 2017-07-12 NOTE — Telephone Encounter (Signed)
Called patient and told her xray and lab results. No sign of infection but would like her to take antibiotic ppx Also discussed once again the possibility of advance imaging for the leg.  Patient at this time working dx is a flare from steroid but told her other imaging would be needed.  Patient states she is feeling a little better  Told her to remove compression wrap and watch but made her promise to go to ER if worsening symptoms.

## 2017-07-13 ENCOUNTER — Telehealth: Payer: Self-pay | Admitting: Family Medicine

## 2017-07-13 ENCOUNTER — Other Ambulatory Visit: Payer: Self-pay | Admitting: Family Medicine

## 2017-07-13 MED ORDER — FLUCONAZOLE 150 MG PO TABS: 150.0000 mg | ORAL_TABLET | Freq: Every day | ORAL | 0 refills | Status: DC

## 2017-07-13 NOTE — Progress Notes (Signed)
Saw phone note sent in med

## 2017-07-13 NOTE — Telephone Encounter (Signed)
Called patient to follow up with patient from pain after injection in the hip.  Patient states she woke up this morning and the pain was completely gone. Feeling very good.  Discussed with patient though if any swelling, worsening pain again or unexplained numbness different then her MS to seek medical attention immediately. Patient though states she is feeling much better then what she has felt in many months.

## 2017-07-14 NOTE — Progress Notes (Deleted)
[name] [place] [place], [place] [address] Phone: [phone] Subjective:      CC: Hip pain follow-up  ION:[id number]  Misty [name] is a 57 y.o. female coming in with complaint of hip pain.  Patient was seen previously for hip arthritis.  Given an injection July 09, 2017.  Seen 1 days later secondary to worsening discomfort and pain.  There is no true signs of aneurysm the patient did decline to go to the emergency room for a Doppler at that time.  X-rays did not show any effusion.  Labs showed no significant signs of infection.  Was put on doxycycline as well as given Diflucan.  Patient was called 24 hours later and was making significant improvement.  Patient states now  Onset-  Location Duration-  Character- Aggravating factors- Reliving factors-  Therapies tried-  Severity-     Past Medical History:  Diagnosis Date  . Allergy   . Arthritis   . Back pain   . Colon cancer ([place])   . Depression   . Diarrhea   . GERD (gastroesophageal reflux disease)   . Hypertension   . Insomnia   . MS (multiple sclerosis) ([place])   . Other hammer toe (acquired) 03/31/2013  . Pneumonia    hx of  . Umbilical hernia    Past Surgical History:  Procedure Laterality Date  . ABDOMINAL HYSTERECTOMY    . ANTERIOR CERVICAL DECOMP/DISCECTOMY FUSION N/A 03/01/2014   Procedure: ANTERIOR CERVICAL DECOMPRESSION/DISCECTOMY FUSION 1 LEVEL;  Surgeon: Charlie Pitter, MD;  Location: [facility];  Service: Neurosurgery;  Laterality: N/A;  ANTERIOR CERVICAL DECOMPRESSION/DISCECTOMY FUSION 1 LEVEL  . BUNIONECTOMY Right 10/14/2014   @[facility]   . CESAREAN SECTION    . COLON SURGERY    . COLONOSCOPY    . FOOT SURGERY Right   . GASTRIC BYPASS    . Hammer Toe Repair Right 10/14/2014   RT #2, @[facility]   . TENOTOMY Right 10/14/2014   RT #3, @[facility]    Social History   Socioeconomic History  . Marital status: Single    Spouse name: Not on file  . Number of children: Not on  file  . Years of education: Not on file  . Highest education level: Not on file  Occupational History  . Not on file  Social Needs  . Financial resource strain: Not on file  . Food insecurity:    Worry: Not on file    Inability: Not on file  . Transportation needs:    Medical: Not on file    Non-medical: Not on file  Tobacco Use  . Smoking status: Current Some Day Smoker    Packs/day: 0.25    Years: 20.00    Pack years: 5.00    Types: Cigarettes  . Smokeless tobacco: Never Used  Substance and Sexual Activity  . Alcohol use: Yes    Alcohol/week: 0.0 oz    Comment: 1/2 pint daily  . Drug use: No  . Sexual activity: Yes    Partners: Male  Lifestyle  . Physical activity:    Days per week: Not on file    Minutes per session: Not on file  . Stress: Not on file  Relationships  . Social connections:    Talks on phone: Not on file    Gets together: Not on file    Attends religious service: Not on file    Active member of club or organization: Not on file    Attends  meetings of clubs or organizations: Not on file    Relationship status: Not on file  Other Topics Concern  . Not on file  Social History Narrative  . Not on file   Allergies  Allergen Reactions  . Phenergan [Promethazine Hcl] Anxiety  . Promethazine Anxiety  . Trazodone And Nefazodone Rash   Family History  Problem Relation Age of Onset  . Stroke Mother   . Hypertension Mother   . Hyperlipidemia Mother   . Diabetes Mother   . Diabetes Brother   . Hypertension Brother   . Hypertension Brother   . Hypertension Brother   . Hypertension Brother   . Hypertension Brother   . Alcoholism Brother      Past medical history, social, surgical and family history all reviewed in electronic medical record.  No pertanent information unless stated regarding to the chief complaint.   Review of Systems:Review of systems updated and as accurate as of 07/14/17  No headache, visual changes, nausea, vomiting,  diarrhea, constipation, dizziness, abdominal pain, skin rash, fevers, chills, night sweats, weight loss, swollen lymph nodes, body aches, joint swelling, muscle aches, chest pain, shortness of breath, mood changes.   Objective  There were no vitals taken for this visit. Systems examined below as of 07/14/17   General: No apparent distress alert and oriented x3 mood and affect normal, dressed appropriately.  HEENT: Pupils equal, extraocular movements intact  Respiratory: Patient's speak in full sentences and does not appear short of breath  Cardiovascular: No lower extremity edema, non tender, no erythema  Skin: Warm dry intact with no signs of infection or rash on extremities or on axial skeleton.  Abdomen: Soft nontender  Neuro: Cranial nerves II through XII are intact, neurovascularly intact in all extremities with 2+ DTRs and 2+ pulses.  Lymph: No lymphadenopathy of posterior or anterior cervical chain or axillae bilaterally.  Gait normal with good balance and coordination.  MSK:  Non tender with full range of motion and good stability and symmetric strength and tone of shoulders, elbows, wrist, knee and ankles bilaterally.  Hip: ROM IR: 45 Deg, ER: 45 Deg, Flexion: 120 Deg, Extension: 100 Deg, Abduction: 45 Deg, Adduction: 45 Deg Strength IR: 5/5, ER: 5/5, Flexion: 5/5, Extension: 5/5, Abduction: 5/5, Adduction: 5/5 Pelvic alignment unremarkable to inspection and palpation. Standing hip rotation and gait without trendelenburg sign / unsteadiness. Greater trochanter without tenderness to palpation. No tenderness over piriformis and greater trochanter. No pain with FABER or FADIR. No SI joint tenderness and normal minimal SI movement.    Impression and Recommendations:     This case required medical decision making of moderate complexity.      Note: This dictation was prepared with Dragon dictation along with smaller phrase technology. Any transcriptional errors that result  from this process are unintentional.

## 2017-07-15 ENCOUNTER — Ambulatory Visit: Payer: Medicare Other | Admitting: Family Medicine

## 2017-07-15 ENCOUNTER — Telehealth: Payer: Self-pay

## 2017-07-15 NOTE — Telephone Encounter (Signed)
rx was called in on Saturday by dr Tamala Julian.

## 2017-07-15 NOTE — Telephone Encounter (Signed)
Copied from [place] [phone]. Topic: Inquiry >> Jul 15, 2017 10:30 AM Neva Seat wrote: Pt talked w/ Dr. Tamala Julian over the weekend.  He was happy with how pt is doing.  Pt is wanting to know if she will still need to come in to the appt today at 2:15? Please call pt back asap to let her know.

## 2017-07-15 NOTE — Telephone Encounter (Signed)
Spoke with patient who is doing much better. No soreness today and she states that she is doing much better.

## 2017-07-29 DIAGNOSIS — Z79891 Long term (current) use of opiate analgesic: Secondary | ICD-10-CM | POA: Diagnosis not present

## 2017-07-29 DIAGNOSIS — G894 Chronic pain syndrome: Secondary | ICD-10-CM | POA: Diagnosis not present

## 2017-07-29 DIAGNOSIS — M545 Low back pain: Secondary | ICD-10-CM | POA: Diagnosis not present

## 2017-08-06 ENCOUNTER — Ambulatory Visit: Payer: Medicare Other

## 2017-08-06 ENCOUNTER — Ambulatory Visit (INDEPENDENT_AMBULATORY_CARE_PROVIDER_SITE_OTHER): Payer: Medicare Other | Admitting: *Deleted

## 2017-08-06 DIAGNOSIS — Z23 Encounter for immunization: Secondary | ICD-10-CM

## 2017-08-06 DIAGNOSIS — E538 Deficiency of other specified B group vitamins: Secondary | ICD-10-CM

## 2017-08-06 MED ORDER — CYANOCOBALAMIN 1000 MCG/ML IJ SOLN
1000.0000 ug | Freq: Once | INTRAMUSCULAR | Status: AC
  Administered 2017-08-06: 1000 ug via INTRAMUSCULAR

## 2017-08-08 ENCOUNTER — Encounter: Payer: Self-pay | Admitting: *Deleted

## 2017-08-08 LAB — TB SKIN TEST
Induration: 0 mm
TB Skin Test: NEGATIVE

## 2017-08-20 ENCOUNTER — Encounter: Payer: Self-pay | Admitting: Podiatry

## 2017-08-20 ENCOUNTER — Ambulatory Visit (INDEPENDENT_AMBULATORY_CARE_PROVIDER_SITE_OTHER): Payer: Medicare Other | Admitting: Podiatry

## 2017-08-20 DIAGNOSIS — B351 Tinea unguium: Secondary | ICD-10-CM

## 2017-08-20 DIAGNOSIS — Q828 Other specified congenital malformations of skin: Secondary | ICD-10-CM | POA: Diagnosis not present

## 2017-08-20 DIAGNOSIS — M79675 Pain in left toe(s): Secondary | ICD-10-CM

## 2017-08-20 DIAGNOSIS — M79674 Pain in right toe(s): Secondary | ICD-10-CM

## 2017-08-20 NOTE — Progress Notes (Signed)
This patient presents the office with chief complaint of continued pain noted from the callus on both feet. She states that the callus becomes very painful walking and wearing her shoes. She says she has  had previous surgery in an attempt to eliminate her pain but her pain persists.  She now says that she presents the office routinely for treatment of her painful calluses on both feet   GENERAL APPEARANCE: Alert, conversant. Appropriately groomed. No acute distress.  VASCULAR: Pedal pulses are  palpable at  DP and PT bilateral.  Capillary refill time is immediate to all digits,  Normal temperature gradient.   NEUROLOGIC: sensation is normal to 5.07 monofilament at 5/5 sites bilateral.  Light touch is intact bilateral, Muscle strength normal.  MUSCULOSKELETAL: acceptable muscle strength, tone and stability bilateral.  Intrinsic muscluature intact bilateral.  Rectus appearance of foot and digits noted bilateral. Heloma durum 5th toe left foot.  Painful distal clavi second toe right foot.  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  DERMATOLOGIC: skin color, texture, and turgor are within normal limits.  No preulcerative lesions or ulcers  are seen, no interdigital maceration noted.  No open lesions present.  Digital nails are asymptomatic. No drainage noted.Porokeratosis sub 3,5 right  foot and 4 left foot. Pinch callus right foot.  Distal clavi second right.  Porokeratosis  B/l  Onychomycosis  B/L  Debridement of porokeratosis  B/L.  Debridement of onychomycosis  B/L.   RTC 10 weeks.  ABN signed for 2019.  Rahmir Beever DPM  

## 2017-08-21 NOTE — Progress Notes (Signed)
[name] [address], [place] [address] Phone: [phone] Subjective:     CC: Multiple complaints, bilateral hip pain  [id number]  Misty [name] is a 57 y.o. female coming in with complaint of bilateral hip pain.  Has infusion of multiple times and have been given a right greater trochanteric injection on June 27, 2017 with minimal improvement and then a right hip injection given July 09, 2017.  Patient unfortunately did have a flare of the steroid in the hip.  Patient did not was doing significantly better with 24 hours after that.  Patient states that her hip pain has gotten worse since last visit. Pain is on the lateral right hip and is radiating into her groin. Pain is constant and is now causing patient to have to use a cane.      Past Medical History:  Diagnosis Date  . Allergy   . Arthritis   . Back pain   . Colon cancer ([place])   . Depression   . Diarrhea   . GERD (gastroesophageal reflux disease)   . Hypertension   . Insomnia   . MS (multiple sclerosis) ([place])   . Other hammer toe (acquired) 03/31/2013  . Pneumonia    hx of  . Umbilical hernia    Past Surgical History:  Procedure Laterality Date  . ABDOMINAL HYSTERECTOMY    . ANTERIOR CERVICAL DECOMP/DISCECTOMY FUSION N/A 03/01/2014   Procedure: ANTERIOR CERVICAL DECOMPRESSION/DISCECTOMY FUSION 1 LEVEL;  Surgeon: Charlie Pitter, MD;  Location: [facility];  Service: Neurosurgery;  Laterality: N/A;  ANTERIOR CERVICAL DECOMPRESSION/DISCECTOMY FUSION 1 LEVEL  . BUNIONECTOMY Right 10/14/2014   @[facility]   . CESAREAN SECTION    . COLON SURGERY    . COLONOSCOPY    . FOOT SURGERY Right   . GASTRIC BYPASS    . Hammer Toe Repair Right 10/14/2014   RT #2, @[facility]   . TENOTOMY Right 10/14/2014   RT #3, @[facility]    Social History   Socioeconomic History  . Marital status: Single    Spouse name: Not on file  . Number of children: Not on file  . Years of education: Not on file  . Highest  education level: Not on file  Occupational History  . Not on file  Social Needs  . Financial resource strain: Not on file  . Food insecurity:    Worry: Not on file    Inability: Not on file  . Transportation needs:    Medical: Not on file    Non-medical: Not on file  Tobacco Use  . Smoking status: Current Some Day Smoker    Packs/day: 0.25    Years: 20.00    Pack years: 5.00    Types: Cigarettes  . Smokeless tobacco: Never Used  Substance and Sexual Activity  . Alcohol use: Yes    Alcohol/week: 0.0 oz    Comment: 1/2 pint daily  . Drug use: No  . Sexual activity: Yes    Partners: Male  Lifestyle  . Physical activity:    Days per week: Not on file    Minutes per session: Not on file  . Stress: Not on file  Relationships  . Social connections:    Talks on phone: Not on file    Gets together: Not on file    Attends religious service: Not on file    Active member of club or organization: Not on file    Attends meetings of clubs or organizations: Not  on file    Relationship status: Not on file  Other Topics Concern  . Not on file  Social History Narrative  . Not on file   Allergies  Allergen Reactions  . Phenergan [Promethazine Hcl] Anxiety  . Promethazine Anxiety  . Trazodone And Nefazodone Rash   Family History  Problem Relation Age of Onset  . Stroke Mother   . Hypertension Mother   . Hyperlipidemia Mother   . Diabetes Mother   . Diabetes Brother   . Hypertension Brother   . Hypertension Brother   . Hypertension Brother   . Hypertension Brother   . Hypertension Brother   . Alcoholism Brother      Past medical history, social, surgical and family history all reviewed in electronic medical record.  No pertanent information unless stated regarding to the chief complaint.   Review of Systems:Review of systems updated and as accurate as of 08/22/17  No headache, visual changes, nausea, vomiting, diarrhea, constipation, dizziness, abdominal pain, skin rash,  fevers, chills, night sweats, weight loss, swollen lymph nodes,, chest pain, shortness of breath, mood changes.  Positive muscle aches, joint swelling, body aches  Objective  Blood pressure 102/72, pulse 98, height 5' 5.5" (1.664 m), weight 242 lb (109.8 kg), SpO2 98 %. Systems examined below as of 08/22/17   General: No apparent distress alert and oriented x3 mood and affect normal, dressed appropriately.  HEENT: Pupils equal, extraocular movements intact  Respiratory: Patient's speak in full sentences and does not appear short of breath  Cardiovascular: No lower extremity edema, non tender, no erythema  Skin: Warm dry intact with no signs of infection or rash on extremities or on axial skeleton.  Abdomen: Soft nontender  Neuro: Cranial nerves II through XII are intact, neurovascularly intact in all extremities with 2+ DTRs and 2+ pulses.  Lymph: No lymphadenopathy of posterior or anterior cervical chain or axillae bilaterally.  Gait antalgic gait MSK:  Non tender with full range of motion and good stability and symmetric strength and tone of shoulders, elbows, wrist, knee and ankles bilaterally.   Hip: Right ROM IR: 5 Deg with severe tenderness, ER: 35 Deg, Flexion: 110 Deg, Extension: 80 Deg, Abduction: 25 Deg, Adduction: 5 Deg Strength IR: 3/5, ER: 3/5, Flexion: 4/5, Extension: 5/5, Abduction: 3/5, Adduction: 3/5 Severe tenderness to palpation with internal rotation of the hip with significant pain in the groin    Impression and Recommendations:     This case required medical decision making of moderate complexity.      Note: This dictation was prepared with Dragon dictation along with smaller phrase technology. Any transcriptional errors that result from this process are unintentional.

## 2017-08-22 ENCOUNTER — Ambulatory Visit (INDEPENDENT_AMBULATORY_CARE_PROVIDER_SITE_OTHER): Payer: Medicare Other | Admitting: Family Medicine

## 2017-08-22 VITALS — BP 102/72 | HR 98 | Ht 65.5 in | Wt 242.0 lb

## 2017-08-22 DIAGNOSIS — M1611 Unilateral primary osteoarthritis, right hip: Secondary | ICD-10-CM

## 2017-08-22 NOTE — Patient Instructions (Signed)
Good to see you  Misty [name] is your friend.  I am so sorry you are hurting and not getting answers on the muscles.  I do feel Dr. Berenice Primas at [facility] ortho will be helpful.  [address]  [phone] but they will call you  I do feel you will have a lot of improvement from a hip replacement and discuss with him your options.  I will see if I can find any other information on the muscle twitching and get back to you

## 2017-08-22 NOTE — Assessment & Plan Note (Signed)
Spent  25 minutes with patient face-to-face and had greater than 50% of counseling including as described in assessment and plan. Discussed with patient in great length.  Discussed that at this point time she did get some resolution of pain from 2 weeks which makes [name] feel confident the patient will do significantly well with a hip replacement.  We discussed with patient about other treatment options including repeating physical therapy which patient does not think she will be able to do.  Patient is also had difficulty with multiple other problems as well.  Patient has many different comorbidities and there is a chance that things would be potentially getting worse in the future and I do feel that she would be a better candidate now for hip replacement than later.  I also believe that they would help her quality of life.  Patient will be referred to orthopedic surgery to discuss further evaluation for surgical intervention.

## 2017-08-28 DIAGNOSIS — G894 Chronic pain syndrome: Secondary | ICD-10-CM | POA: Diagnosis not present

## 2017-08-28 DIAGNOSIS — Z79891 Long term (current) use of opiate analgesic: Secondary | ICD-10-CM | POA: Diagnosis not present

## 2017-08-28 DIAGNOSIS — M545 Low back pain: Secondary | ICD-10-CM | POA: Diagnosis not present

## 2017-08-31 DIAGNOSIS — M1611 Unilateral primary osteoarthritis, right hip: Secondary | ICD-10-CM | POA: Diagnosis not present

## 2017-09-09 ENCOUNTER — Encounter: Payer: Self-pay | Admitting: *Deleted

## 2017-09-09 ENCOUNTER — Ambulatory Visit (INDEPENDENT_AMBULATORY_CARE_PROVIDER_SITE_OTHER): Payer: Medicare Other | Admitting: *Deleted

## 2017-09-09 DIAGNOSIS — E538 Deficiency of other specified B group vitamins: Secondary | ICD-10-CM | POA: Diagnosis not present

## 2017-09-09 MED ORDER — CYANOCOBALAMIN 1000 MCG/ML IJ SOLN
1000.0000 ug | Freq: Once | INTRAMUSCULAR | Status: AC
  Administered 2017-09-09: 1000 ug via INTRAMUSCULAR

## 2017-09-16 ENCOUNTER — Encounter: Payer: Self-pay | Admitting: Internal Medicine

## 2017-09-16 ENCOUNTER — Other Ambulatory Visit: Payer: Medicare Other

## 2017-09-16 ENCOUNTER — Ambulatory Visit (INDEPENDENT_AMBULATORY_CARE_PROVIDER_SITE_OTHER): Payer: Medicare Other | Admitting: Internal Medicine

## 2017-09-16 VITALS — BP 122/60 | HR 90 | Temp 97.9°F | Resp 16 | Wt 244.0 lb

## 2017-09-16 DIAGNOSIS — K439 Ventral hernia without obstruction or gangrene: Secondary | ICD-10-CM | POA: Insufficient documentation

## 2017-09-16 DIAGNOSIS — R109 Unspecified abdominal pain: Secondary | ICD-10-CM

## 2017-09-16 LAB — POCT URINALYSIS DIPSTICK
Bilirubin, UA: NEGATIVE
Blood, UA: NEGATIVE
Glucose, UA: NEGATIVE
Ketones, UA: NEGATIVE
Leukocytes, UA: NEGATIVE
Nitrite, UA: NEGATIVE
Protein, UA: NEGATIVE
Spec Grav, UA: >=1.03 — AB (ref 1.010–1.025)
Urobilinogen, UA: 0.2 E.U./dL
pH, UA: 6 (ref 5.0–8.0)

## 2017-09-16 NOTE — Patient Instructions (Signed)
  Test(s) ordered today. Your results will be released to [place] (or called to you) after review, usually within 72hours after test completion. If any changes need to be made, you will be notified at that same time.  Medications reviewed and updated.   No changes recommended at this time.  A ct scan was ordered to rule out a kidney infection.

## 2017-09-16 NOTE — Progress Notes (Signed)
Subjective:    Patient ID: [name], female    DOB: [birth year], 57 y.o.   MRN: [id number]  HPI The patient is here for an acute visit.   Right side pain:  It started 3 days ago.  The pain is located on the right lower side/flank to right lower abdominal/groin.  The pain was initially constant but after the first day it was intermittent.  She did have an antibiotic at home, doxycycline and took 2 doses.  She thinks that may have helped the pain.  She was concerned it may be an infection.  The pain is worse with standing and movement.  The pain is better with nothing.  Her chronic pain in her back is unchanged.  Her pain medication she takes for her back does not seem to help this pain.  She is also having increased hip pain.  She is following with pain management and orthopedics.  She has had a kidney stone in the past.   She does have increased urinary frequency, but denies difficulty urinating, dysuria and hematuria.  She denies any fevers or chills.  She denies nausea, diarrhea, constipation, blood in the stool and abdominal pain.    Medications and allergies reviewed with patient and updated if appropriate.  Patient Active Problem List   Diagnosis Date Noted  . Greater trochanteric bursitis of right hip 04/30/2017  . Groin pain, right 04/17/2017  . Prediabetes 04/16/2017  . Arm pain, left 01/29/2017  . Numbness 01/29/2017  . Chronic midline low back pain 07/20/2016  . Sacral mass 07/20/2016  . Osteoarthritis of right hip 07/20/2016  . Spinal stenosis of lumbar region 11/29/2015  . Positive urine drug screen 11/16/2015  . Chronic pain syndrome 10/12/2015  . Umbilical hernia 82/50/5397  . Sleep difficulties 07/15/2015  . Bunion, right 06/20/2015  . Callus of foot 04/27/2015  . Muscle spasm 04/16/2015  . B12 deficiency 03/30/2015  . Hammer toe of left foot 02/16/2015  . Fibrosis of skin of lower extremity 02/16/2015  . Status post right foot surgery 10/26/2014  .  Cervical spondylosis with radiculopathy 03/01/2014  . Hammer toe of right foot 01/26/2014  . Benign intracranial hypertension 06/18/2013  . Pain in lower limb 05/22/2013  . Abnormal gait 04/21/2013  . Arthritis 04/21/2013  . Disturbance of skin sensation 04/21/2013  . Cephalalgia 04/21/2013  . Porokeratosis 03/31/2013  . Pain, foot 03/31/2013  . Vitamin D deficiency 04/18/2010  . Fatigue 04/14/2010  . Back pain with right-sided radiculopathy 11/29/2008  . MEDIAL MENISCUS TEAR, LEFT 06/30/2008  . BARRETTS ESOPHAGUS 06/28/2008  . OSTEOARTHRITIS, KNEES, BILATERAL 06/28/2008  . OBESITY 05/24/2008  . HYPERTENSION, BENIGN ESSENTIAL 05/24/2008  . IRRITABLE BOWEL SYNDROME 05/24/2008  . BARIATRIC SURGERY STATUS 03/19/2006  . NEOPLASM, MALIGNANT, COLON, HX OF 03/19/1997    Current Outpatient Medications on File Prior to Visit  Medication Sig Dispense Refill  . baclofen (LIORESAL) 10 MG tablet     . Cyanocobalamin (VITAMIN B-12 IJ) Inject as directed.    . gabapentin (NEURONTIN) 300 MG capsule One po qAM, two po q PM and 2 po q HS 150 capsule 5  . hydrochlorothiazide (MICROZIDE) 12.5 MG capsule Take 1 capsule (12.5 mg total) by mouth daily. 90 capsule 3  . ibuprofen (ADVIL,MOTRIN) 200 MG tablet Take 200 mg by mouth every 6 (six) hours as needed.    Marland Kitchen lisinopril (PRINIVIL,ZESTRIL) 20 MG tablet     . naproxen sodium (ANAPROX) 220 MG tablet Take 220 mg by mouth  2 (two) times daily with a meal.    . Oxycodone HCl 10 MG TABS     . predniSONE (DELTASONE) 50 MG tablet Take 1 tablet (50 mg total) by mouth daily. 5 tablet 0  . SSD 1 % cream     . VENTOLIN HFA 108 (90 Base) MCG/ACT inhaler     . Vitamin D, Ergocalciferol, (DRISDOL) 50000 units CAPS capsule Take 1 capsule (50,000 Units total) by mouth every 7 (seven) days. 12 capsule 0  . XTAMPZA ER 9 MG C12A      No current facility-administered medications on file prior to visit.     Past Medical History:  Diagnosis Date  . Allergy   .  Arthritis   . Back pain   . Colon cancer ([place])   . Depression   . Diarrhea   . GERD (gastroesophageal reflux disease)   . Hypertension   . Insomnia   . MS (multiple sclerosis) ([place])   . Other hammer toe (acquired) 03/31/2013  . Pneumonia    hx of  . Umbilical hernia     Past Surgical History:  Procedure Laterality Date  . ABDOMINAL HYSTERECTOMY    . ANTERIOR CERVICAL DECOMP/DISCECTOMY FUSION N/A 03/01/2014   Procedure: ANTERIOR CERVICAL DECOMPRESSION/DISCECTOMY FUSION 1 LEVEL;  Surgeon: Charlie Pitter, MD;  Location: [facility];  Service: Neurosurgery;  Laterality: N/A;  ANTERIOR CERVICAL DECOMPRESSION/DISCECTOMY FUSION 1 LEVEL  . BUNIONECTOMY Right 10/14/2014   @[facility]   . CESAREAN SECTION    . COLON SURGERY    . COLONOSCOPY    . FOOT SURGERY Right   . GASTRIC BYPASS    . Hammer Toe Repair Right 10/14/2014   RT #2, @[facility]   . TENOTOMY Right 10/14/2014   RT #3, @[facility]     Social History   Socioeconomic History  . Marital status: Single    Spouse name: Not on file  . Number of children: Not on file  . Years of education: Not on file  . Highest education level: Not on file  Occupational History  . Not on file  Social Needs  . Financial resource strain: Not on file  . Food insecurity:    Worry: Not on file    Inability: Not on file  . Transportation needs:    Medical: Not on file    Non-medical: Not on file  Tobacco Use  . Smoking status: Current Some Day Smoker    Packs/day: 0.25    Years: 20.00    Pack years: 5.00    Types: Cigarettes  . Smokeless tobacco: Never Used  Substance and Sexual Activity  . Alcohol use: Yes    Alcohol/week: 0.0 oz    Comment: 1/2 pint daily  . Drug use: No  . Sexual activity: Yes    Partners: Male  Lifestyle  . Physical activity:    Days per week: Not on file    Minutes per session: Not on file  . Stress: Not on file  Relationships  . Social connections:    Talks on phone: Not on file    Gets together: Not on file    Attends  religious service: Not on file    Active member of club or organization: Not on file    Attends meetings of clubs or organizations: Not on file    Relationship status: Not on file  Other Topics Concern  . Not on file  Social History Narrative  . Not on file    Family History  Problem Relation Age  of Onset  . Stroke Mother   . Hypertension Mother   . Hyperlipidemia Mother   . Diabetes Mother   . Diabetes Brother   . Hypertension Brother   . Hypertension Brother   . Hypertension Brother   . Hypertension Brother   . Hypertension Brother   . Alcoholism Brother     Review of Systems  Constitutional: Negative for chills and fever.  Gastrointestinal: Negative for abdominal pain, blood in stool, constipation, diarrhea and nausea.  Genitourinary: Positive for flank pain (right side) and frequency. Negative for difficulty urinating, dysuria and hematuria.       No change in urine color or odor       Objective:   Vitals:   09/16/17 1527  BP: 122/60  Pulse: 90  Resp: 16  Temp: 97.9 F (36.6 C)  SpO2: 97%   BP Readings from Last 3 Encounters:  09/16/17 122/60  08/22/17 102/72  07/12/17 (!) 104/58   Wt Readings from Last 3 Encounters:  09/16/17 244 lb (110.7 kg)  08/22/17 242 lb (109.8 kg)  07/12/17 237 lb (107.5 kg)   Body mass index is 39.99 kg/m.   Physical Exam  Constitutional: She appears well-developed and well-nourished. No distress.  HENT:  Head: Normocephalic and atraumatic.  Abdominal: Soft. She exhibits no mass. There is tenderness (rlq and right side/flank). There is no rebound and no guarding.  obese  Genitourinary:  Genitourinary Comments: Right side/flank pain with palpation  Musculoskeletal: She exhibits no edema.  Skin: Skin is warm and dry. No rash noted. She is not diaphoretic.         Assessment & Plan:    See Problem List for Assessment and Plan of chronic medical problems.

## 2017-09-16 NOTE — Assessment & Plan Note (Signed)
Right flank-right lower quadrant pain x3 days Pain was initially constant and now more intermittent No associated GI symptoms, increased urinary frequency, but no other urinary symptoms History of kidney stone Possible causes include musculoskeletal, UTI, kidney stone and less likely GI source Check CBC, CMP, urine dip and culture CT renal stone study Already on chronic pain medication-we will not prescribe any additional pain medication

## 2017-09-17 ENCOUNTER — Ambulatory Visit (INDEPENDENT_AMBULATORY_CARE_PROVIDER_SITE_OTHER)
Admission: RE | Admit: 2017-09-17 | Discharge: 2017-09-17 | Disposition: A | Discharge status: Home / Self Care | Payer: Medicare Other | Source: Ambulatory Visit | Attending: Internal Medicine | Admitting: Internal Medicine

## 2017-09-17 ENCOUNTER — Encounter: Payer: Self-pay | Admitting: Emergency Medicine

## 2017-09-17 DIAGNOSIS — K8020 Calculus of gallbladder without cholecystitis without obstruction: Secondary | ICD-10-CM | POA: Diagnosis not present

## 2017-09-17 DIAGNOSIS — R109 Unspecified abdominal pain: Secondary | ICD-10-CM | POA: Diagnosis not present

## 2017-09-17 LAB — URINE CULTURE
MICRO NUMBER:: 90781405
Result:: NO GROWTH
SPECIMEN QUALITY:: ADEQUATE

## 2017-09-18 ENCOUNTER — Telehealth: Payer: Self-pay

## 2017-09-18 NOTE — Telephone Encounter (Signed)
I think the pain may be musculoskeletal and I would advise she see her orthopedic or pain management doctor.

## 2017-09-18 NOTE — Telephone Encounter (Signed)
Copied from [place] [phone]. Topic: General - Other >> Sep 18, 2017 11:54 AM Keene Breath wrote: Reason for CRM: Patient called to state that she is still having severe abdominal pain.  She would like to know what she can do to relieve the pain.  Would like a call back to discuss her options.  CB# [phone].  Routing to dr burns---no significant findings on renal study, no infection with urine culture, i'm not seeing any other referrals placed----please advise, I will call patient back, thanks

## 2017-09-18 NOTE — Telephone Encounter (Signed)
Tried to call patient with dr burns instructions, x3 calls, i'm not sure if patient heard my message, phone kept fading in and out, or I can't get any answer when I call the number, I will continue trying trying to call patient on Friday (office closed thurs,july 4th)

## 2017-09-20 NOTE — Telephone Encounter (Signed)
Patient advised of dr burns note/instructions, she has already made appt on 7/10 with ortho

## 2017-09-25 ENCOUNTER — Telehealth: Payer: Self-pay | Admitting: Internal Medicine

## 2017-09-25 DIAGNOSIS — M545 Low back pain: Secondary | ICD-10-CM | POA: Diagnosis not present

## 2017-09-25 DIAGNOSIS — Z79891 Long term (current) use of opiate analgesic: Secondary | ICD-10-CM | POA: Diagnosis not present

## 2017-09-25 DIAGNOSIS — G894 Chronic pain syndrome: Secondary | ICD-10-CM | POA: Diagnosis not present

## 2017-09-25 NOTE — Telephone Encounter (Signed)
He can give a small supply of Xanax for her to use as needed for flying.  She cannot drink any alcohol with this.  Let me know if this is okay and I can send prescription to pharmacy

## 2017-09-25 NOTE — Telephone Encounter (Signed)
Copied from [place] [phone]. Topic: Quick Communication - See Telephone Encounter >> Sep 25, 2017  3:29 PM Synthia Innocent wrote: CRM for notification. See Telephone encounter for: 09/25/17. Going on a plane, has anxiety. Can something be called in? Please advise. [facility]

## 2017-09-26 MED ORDER — ALPRAZOLAM 0.5 MG PO TABS: 0.5000 mg | ORAL_TABLET | Freq: Three times a day (TID) | ORAL | 0 refills | Status: DC | PRN

## 2017-09-26 NOTE — Telephone Encounter (Signed)
sent 

## 2017-09-26 NOTE — Telephone Encounter (Signed)
Spoke with pt. Advised pt of MDs message. Please send to [facility].

## 2017-10-08 ENCOUNTER — Other Ambulatory Visit: Payer: Self-pay | Admitting: Internal Medicine

## 2017-10-14 NOTE — Progress Notes (Addendum)
Subjective:   Misty [name] is a 57 y.o. female who presents for an Initial Medicare Annual Wellness Visit.  Review of Systems    No ROS.  Medicare Wellness Visit. Additional risk factors are reflected in the social history.   Cardiac Risk Factors include: advanced age (>20men, >90 women);hypertension;obesity (BMI >30kg/m2) Sleep patterns: sleeps excessively,gets up 1-2 times nightly to void and sleeps 5-6 hours nightly.  Patient states she does not sleep well at night and that she does stay in the bed off and on throughout the day and sleeps. Sleep tip education was provided via emmi.   Home Safety/Smoke Alarms: Feels safe in home. Smoke alarms in place.  Living environment; residence and Firearm Safety: 2-story house, equipment: Radio producer, Type: [employer], Type: Tub Surveyor, quantity, no firearms. Grand-daughter stays with patient, no needs for DME, good support system Seat Belt Safety/Bike Helmet: Wears seat belt.      Objective:    Today's Vitals   10/15/17 1437 10/15/17 1441  BP: (!) 144/72   Pulse: 77   Resp: 18   Temp: 98.4 F (36.9 C)   SpO2: 99%   Weight: 249 lb (112.9 kg)   Height: 5\' 6"  (1.676 m)   PainSc:  4    Body mass index is 40.19 kg/m.  Advanced Directives 10/15/2017 12/18/2016 06/22/2016 03/06/2016 03/01/2016 03/14/2015 02/23/2014  Does Patient Have a Medical Advance Directive? No No No - No No No  Would patient like information on creating a medical advance directive? No - Patient declined No - Patient declined - No - Patient declined No - Patient declined No - patient declined information No - patient declined information    Current Medications (verified) Outpatient Encounter Medications as of 10/15/2017  Medication Sig  . Cyanocobalamin (VITAMIN B-12 IJ) Inject as directed.  . gabapentin (NEURONTIN) 300 MG capsule One po qAM, two po q PM and 2 po q HS  . hydrochlorothiazide (MICROZIDE) 12.5 MG capsule Take 1 capsule (12.5 mg total) by  mouth daily.  Marland Kitchen ibuprofen (ADVIL,MOTRIN) 200 MG tablet Take 200 mg by mouth every 6 (six) hours as needed.  Marland Kitchen lisinopril (PRINIVIL,ZESTRIL) 20 MG tablet   . naproxen sodium (ANAPROX) 220 MG tablet Take 220 mg by mouth 2 (two) times daily with a meal.  . Oxycodone HCl 10 MG TABS   . SSD 1 % cream   . VENTOLIN HFA 108 (90 Base) MCG/ACT inhaler   . XTAMPZA ER 9 MG C12A   . [DISCONTINUED] ALPRAZolam (XANAX) 0.5 MG tablet Take 1 tablet (0.5 mg total) by mouth 3 (three) times daily as needed (anxiety related to flying). (Patient not taking: Reported on 10/15/2017)  . [DISCONTINUED] baclofen (LIORESAL) 10 MG tablet   . [DISCONTINUED] lisinopril (PRINIVIL,ZESTRIL) 20 MG tablet TAKE 1 TABLET BY MOUTH ONCE DAILY (Patient not taking: Reported on 10/15/2017)  . [DISCONTINUED] predniSONE (DELTASONE) 50 MG tablet Take 1 tablet (50 mg total) by mouth daily. (Patient not taking: Reported on 10/15/2017)  . [DISCONTINUED] Vitamin D, Ergocalciferol, (DRISDOL) 50000 units CAPS capsule Take 1 capsule (50,000 Units total) by mouth every 7 (seven) days. (Patient not taking: Reported on 10/15/2017)   No facility-administered encounter medications on file as of 10/15/2017.     Allergies (verified) Phenergan [promethazine hcl]; Promethazine; and Trazodone and nefazodone   History: Past Medical History:  Diagnosis Date  . Allergy   . Arthritis   . Back pain   . Colon cancer ([place])   . Depression   .  Diarrhea   . GERD (gastroesophageal reflux disease)   . Hypertension   . Insomnia   . MS (multiple sclerosis) ([place])   . Other hammer toe (acquired) 03/31/2013  . Pneumonia    hx of  . Umbilical hernia    Past Surgical History:  Procedure Laterality Date  . ABDOMINAL HYSTERECTOMY    . ANTERIOR CERVICAL DECOMP/DISCECTOMY FUSION N/A 03/01/2014   Procedure: ANTERIOR CERVICAL DECOMPRESSION/DISCECTOMY FUSION 1 LEVEL;  Surgeon: Charlie Pitter, MD;  Location: [facility];  Service: Neurosurgery;  Laterality: N/A;   ANTERIOR CERVICAL DECOMPRESSION/DISCECTOMY FUSION 1 LEVEL  . BUNIONECTOMY Right 10/14/2014   @[facility]   . CESAREAN SECTION    . COLON SURGERY    . COLONOSCOPY    . FOOT SURGERY Right   . GASTRIC BYPASS    . Hammer Toe Repair Right 10/14/2014   RT #2, @[facility]   . TENOTOMY Right 10/14/2014   RT #3, @[facility]    Family History  Problem Relation Age of Onset  . Stroke Mother   . Hypertension Mother   . Hyperlipidemia Mother   . Diabetes Mother   . Diabetes Brother   . Hypertension Brother   . Hypertension Brother   . Hypertension Brother   . Hypertension Brother   . Hypertension Brother   . Alcoholism Brother    Social History   Socioeconomic History  . Marital status: Single    Spouse name: Not on file  . Number of children: 2  . Years of education: Not on file  . Highest education level: Not on file  Occupational History  . Not on file  Social Needs  . Financial resource strain: Not hard at all  . Food insecurity:    Worry: Never true    Inability: Never true  . Transportation needs:    Medical: No    Non-medical: No  Tobacco Use  . Smoking status: Current Some Day Smoker    Packs/day: 0.25    Years: 20.00    Pack years: 5.00    Types: Cigarettes  . Smokeless tobacco: Never Used  Substance and Sexual Activity  . Alcohol use: Yes    Alcohol/week: 0.0 oz    Comment: 1/2 pint daily  . Drug use: No  . Sexual activity: Yes    Partners: Male  Lifestyle  . Physical activity:    Days per week: 0 days    Minutes per session: 0 min  . Stress: To some extent  Relationships  . Social connections:    Talks on phone: More than three times a week    Gets together: More than three times a week    Attends religious service: Not on file    Active member of club or organization: Not on file    Attends meetings of clubs or organizations: Not on file    Relationship status: Not on file  Other Topics Concern  . Not on file  Social History Narrative  . Not on file    Tobacco  Counseling Ready to quit: Not Answered Counseling given: Not Answered  Activities of Daily Living In your present state of health, do you have any difficulty performing the following activities: 10/15/2017  Hearing? N  Vision? N  Difficulty concentrating or making decisions? N  Walking or climbing stairs? Y  Dressing or bathing? N  Doing errands, shopping? N  Preparing Food and eating ? N  Using the Toilet? N  In the past six months, have you accidently leaked urine? N  Do you have problems with loss of bowel control? N  Managing your Medications? N  Managing your Finances? N  Housekeeping or managing your Housekeeping? N  Some recent data might be hidden     Immunizations and Health Maintenance Immunization History  Administered Date(s) Administered  . Influenza Whole 04/14/2010  . Influenza,inj,Quad PF,6+ Mos 03/01/2014, 01/18/2016, 04/17/2017  . Influenza-Unspecified 03/16/2015  . PPD Test 08/06/2017   Health Maintenance Due  Topic Date Due  . TETANUS/TDAP  04/10/1980  . MAMMOGRAM  08/10/2016    Patient Care Team: [name], MD as PCP - General (Internal Medicine)  Indicate any recent Medical Services you may have received from other than [facility] providers in the past year (date may be approximate).     Assessment:   This is a routine wellness examination for [place]. Physical assessment deferred to PCP.   Hearing/Vision screen Hearing Screening Comments: Able to hear conversational tones w/o difficulty. No issues reported.  Passed whisper test  Vision Screening Comments: appointment yearly   Dietary issues and exercise activities discussed: Current Exercise Habits: The patient does not participate in regular exercise at present, Exercise limited by: orthopedic condition(s)  Diet (meal preparation, eat out, water intake, caffeinated beverages, dairy products, fruits and vegetables): in general, a "healthy" diet  , on average, 1-2 meals per day Reports poor  appetite. History of gastric bypass.  Encouraged patient to increase daily water and healthy fluid intake. Discussed drinking nutritional supplements.    Goals    . Patient Stated     I want to work towards getting my pain under control so I think about going back to work.      Depression Screen PHQ 2/9 Scores 10/15/2017 07/10/2016 06/22/2016  PHQ - 2 Score 2 0 1  PHQ- 9 Score 10 - 6    Fall Risk Fall Risk  10/15/2017 06/22/2016 10/11/2015  Falls in the past year? Yes Yes Yes  Number falls in past yr: - 2 or more 2 or more  Injury with Fall? - Yes No  Comment - shoulder dislocated -  Risk Factor Category  - High Fall Risk -  Risk for fall due to : Impaired mobility;Impaired balance/gait;History of fall(s) Impaired balance/gait -  Follow up - Falls prevention discussed;Education provided Falls evaluation completed   Cognitive Function:       Ad8 score reviewed for issues:  Issues making decisions: no  Less interest in hobbies / activities: no  Repeats questions, stories (family complaining): no  Trouble using ordinary gadgets (microwave, computer, phone):no  Forgets the month or year: no  Mismanaging finances: no  Remembering appts: no  Daily problems with thinking and/or memory: no Ad8 score is= 0 Screening Tests Health Maintenance  Topic Date Due  . TETANUS/TDAP  04/10/1980  . MAMMOGRAM  08/10/2016  . INFLUENZA VACCINE  10/17/2017  . COLONOSCOPY  09/08/2024  . Hepatitis C Screening  Completed  . HIV Screening  Completed     Plan:    Continue doing brain stimulating activities (puzzles, reading, adult coloring books, staying active) to keep memory sharp.   Continue to eat heart healthy diet (full of fruits, vegetables, whole grains, lean protein, water--limit salt, fat, and sugar intake) and increase physical activity as tolerated.  I have personally reviewed and noted the following in the patient's chart:   . Medical and social history . Use of alcohol,  tobacco or illicit drugs  . Current medications and supplements . Functional ability and status . Nutritional  status . Physical activity . Advanced directives . List of other physicians . Vitals . Screenings to include cognitive, depression, and falls . Referrals and appointments  In addition, I have reviewed and discussed with patient certain preventive protocols, quality metrics, and best practice recommendations. A written personalized care plan for preventive services as well as general preventive health recommendations were provided to patient.     Michiel Cowboy, RN   10/15/2017    Medical screening examination/treatment/procedure(s) were performed by non-physician practitioner and as supervising physician I was immediately available for consultation/collaboration. I agree with above. [name], MD

## 2017-10-15 ENCOUNTER — Encounter: Payer: Self-pay | Admitting: Internal Medicine

## 2017-10-15 ENCOUNTER — Ambulatory Visit (INDEPENDENT_AMBULATORY_CARE_PROVIDER_SITE_OTHER): Payer: Medicare Other | Admitting: Internal Medicine

## 2017-10-15 ENCOUNTER — Ambulatory Visit (INDEPENDENT_AMBULATORY_CARE_PROVIDER_SITE_OTHER): Payer: Medicare Other | Admitting: *Deleted

## 2017-10-15 ENCOUNTER — Other Ambulatory Visit (INDEPENDENT_AMBULATORY_CARE_PROVIDER_SITE_OTHER): Payer: Medicare Other

## 2017-10-15 VITALS — BP 144/72 | HR 77 | Temp 98.4°F | Resp 18 | Ht 66.0 in | Wt 249.0 lb

## 2017-10-15 VITALS — BP 144/72 | HR 77 | Temp 98.4°F | Resp 16 | Wt 249.0 lb

## 2017-10-15 DIAGNOSIS — M545 Low back pain: Secondary | ICD-10-CM

## 2017-10-15 DIAGNOSIS — I10 Essential (primary) hypertension: Secondary | ICD-10-CM | POA: Diagnosis not present

## 2017-10-15 DIAGNOSIS — E538 Deficiency of other specified B group vitamins: Secondary | ICD-10-CM | POA: Diagnosis not present

## 2017-10-15 DIAGNOSIS — R109 Unspecified abdominal pain: Secondary | ICD-10-CM | POA: Diagnosis not present

## 2017-10-15 DIAGNOSIS — Z0000 Encounter for general adult medical examination without abnormal findings: Secondary | ICD-10-CM | POA: Diagnosis not present

## 2017-10-15 DIAGNOSIS — R7303 Prediabetes: Secondary | ICD-10-CM

## 2017-10-15 DIAGNOSIS — E559 Vitamin D deficiency, unspecified: Secondary | ICD-10-CM | POA: Diagnosis not present

## 2017-10-15 DIAGNOSIS — G8929 Other chronic pain: Secondary | ICD-10-CM

## 2017-10-15 DIAGNOSIS — Z1231 Encounter for screening mammogram for malignant neoplasm of breast: Secondary | ICD-10-CM

## 2017-10-15 LAB — CBC WITH DIFFERENTIAL/PLATELET
Basophils Absolute: 0 10*3/uL (ref 0.0–0.1)
Basophils Relative: 0.5 % (ref 0.0–3.0)
Eosinophils Absolute: 0.2 10*3/uL (ref 0.0–0.7)
Eosinophils Relative: 2.4 % (ref 0.0–5.0)
HCT: 38.8 % (ref 36.0–46.0)
Hemoglobin: 12.6 g/dL (ref 12.0–15.0)
Lymphocytes Relative: 22.1 % (ref 12.0–46.0)
Lymphs Abs: 1.5 10*3/uL (ref 0.7–4.0)
MCHC: 32.5 g/dL (ref 30.0–36.0)
MCV: 96.7 fl (ref 78.0–100.0)
Monocytes Absolute: 0.5 10*3/uL (ref 0.1–1.0)
Monocytes Relative: 8.3 % (ref 3.0–12.0)
Neutro Abs: 4.4 10*3/uL (ref 1.4–7.7)
Neutrophils Relative %: 66.7 % (ref 43.0–77.0)
Platelets: 212 10*3/uL (ref 150.0–400.0)
RBC: 4.01 Mil/uL (ref 3.87–5.11)
RDW: 13.8 % (ref 11.5–15.5)
WBC: 6.6 10*3/uL (ref 4.0–10.5)

## 2017-10-15 LAB — COMPREHENSIVE METABOLIC PANEL
ALT: 12 U/L (ref 0–35)
AST: 16 U/L (ref 0–37)
Albumin: 4 g/dL (ref 3.5–5.2)
Alkaline Phosphatase: 102 U/L (ref 39–117)
BUN: 27 mg/dL — ABNORMAL HIGH (ref 6–23)
CO2: 29 mEq/L (ref 19–32)
Calcium: 10 mg/dL (ref 8.4–10.5)
Chloride: 101 mEq/L (ref 96–112)
Creatinine, Ser: 1.33 mg/dL — ABNORMAL HIGH (ref 0.40–1.20)
GFR: 52.98 mL/min — ABNORMAL LOW (ref >60.00)
Glucose, Bld: 108 mg/dL — ABNORMAL HIGH (ref 70–99)
Potassium: 4.5 mEq/L (ref 3.5–5.1)
Sodium: 137 mEq/L (ref 135–145)
Total Bilirubin: 0.6 mg/dL (ref 0.2–1.2)
Total Protein: 7.7 g/dL (ref 6.0–8.3)

## 2017-10-15 LAB — HEMOGLOBIN A1C: Hgb A1c MFr Bld: 5.5 % (ref 4.6–6.5)

## 2017-10-15 LAB — VITAMIN D 25 HYDROXY (VIT D DEFICIENCY, FRACTURES): VITD: 21.96 ng/mL — ABNORMAL LOW (ref 30.00–100.00)

## 2017-10-15 MED ORDER — CYANOCOBALAMIN 1000 MCG/ML IJ SOLN
1000.0000 ug | Freq: Once | INTRAMUSCULAR | Status: AC
  Administered 2017-10-15: 1000 ug via INTRAMUSCULAR

## 2017-10-15 NOTE — Progress Notes (Signed)
Subjective:    Patient ID: Misty [name], female    DOB: Apr 14, 1960, 57 y.o.   MRN: [id number]  HPI The patient is here for follow up.  Hypertension: She is taking her medication daily. She is compliant with a low sodium diet.  She denies chest pain, palpitations, shortness of breath and regular headaches. She is not exercising regularly.  She does not monitor her blood pressure at home.    Prediabetes:  She is not compliant with a low sugar/carbohydrate diet.  She is not exercising regularly.  B12 deficiency:  She is getting B12 injections monthly.  She is due for an injection today.     Chronic lower back pain, hip pain;  She follows with pain management. Her pain is controlled with her current medication.  She has significant back pain in the middle of the night when she gets up to go to the bathroom.  The pain medications does not seem to last her throughout the night.  She does feel inhibited by her back pain and hip pain.  ?  Depression motivation: She is not able to do some of the things that she wants to do, including working in hospitality.  This is incredibly frustrating for her.  She does have several symptoms suggestive of depression and initially states she is not sure what she is, but when later asked she denies depression.  She is not interested in taking any medication for depression and does not wish to speak to anyone for depression.  Medications and allergies reviewed with patient and updated if appropriate.  Patient Active Problem List   Diagnosis Date Noted  . Ventral hernia 09/16/2017  . Acute right flank pain 09/16/2017  . Abdominal pain 09/16/2017  . Greater trochanteric bursitis of right hip 04/30/2017  . Groin pain, right 04/17/2017  . Prediabetes 04/16/2017  . Arm pain, left 01/29/2017  . Numbness 01/29/2017  . Chronic midline low back pain 07/20/2016  . Sacral mass 07/20/2016  . Osteoarthritis of right hip 07/20/2016  . Spinal stenosis of lumbar  region 11/29/2015  . Positive urine drug screen 11/16/2015  . Chronic pain syndrome 10/12/2015  . Umbilical hernia [id number]/9562  . Sleep difficulties 07/15/2015  . Bunion, right 06/20/2015  . Callus of foot 04/27/2015  . Muscle spasm 04/16/2015  . B12 deficiency 03/30/2015  . Hammer toe of left foot 02/16/2015  . Fibrosis of skin of lower extremity 02/16/2015  . Status post right foot surgery 10/26/2014  . Cervical spondylosis with radiculopathy 03/01/2014  . Hammer toe of right foot 01/26/2014  . Benign intracranial hypertension 06/18/2013  . Pain in lower limb 05/22/2013  . Abnormal gait 04/21/2013  . Arthritis 04/21/2013  . Disturbance of skin sensation 04/21/2013  . Cephalalgia 04/21/2013  . Porokeratosis 03/31/2013  . Vitamin D deficiency 04/18/2010  . Fatigue 04/14/2010  . Back pain with right-sided radiculopathy 11/29/2008  . MEDIAL MENISCUS TEAR, LEFT 06/30/2008  . BARRETTS ESOPHAGUS 06/28/2008  . OSTEOARTHRITIS, KNEES, BILATERAL 06/28/2008  . OBESITY 05/24/2008  . HYPERTENSION, BENIGN ESSENTIAL 05/24/2008  . IRRITABLE BOWEL SYNDROME 05/24/2008  . BARIATRIC SURGERY STATUS 03/19/2006  . NEOPLASM, MALIGNANT, COLON, HX OF 03/19/1997    Current Outpatient Medications on File Prior to Visit  Medication Sig Dispense Refill  . Cyanocobalamin (VITAMIN B-12 IJ) Inject as directed.    . gabapentin (NEURONTIN) 300 MG capsule One po qAM, two po q PM and 2 po q HS 150 capsule 5  . hydrochlorothiazide (MICROZIDE) 12.5 MG capsule  Take 1 capsule (12.5 mg total) by mouth daily. 90 capsule 3  . ibuprofen (ADVIL,MOTRIN) 200 MG tablet Take 200 mg by mouth every 6 (six) hours as needed.    Marland Kitchen lisinopril (PRINIVIL,ZESTRIL) 20 MG tablet     . naproxen sodium (ANAPROX) 220 MG tablet Take 220 mg by mouth 2 (two) times daily with a meal.    . Oxycodone HCl 10 MG TABS     . SSD 1 % cream     . VENTOLIN HFA 108 (90 Base) MCG/ACT inhaler     . XTAMPZA ER 9 MG C12A      No current  facility-administered medications on file prior to visit.     Past Medical History:  Diagnosis Date  . Allergy   . Arthritis   . Back pain   . Colon cancer ([place])   . Depression   . Diarrhea   . GERD (gastroesophageal reflux disease)   . Hypertension   . Insomnia   . MS (multiple sclerosis) ([place])   . Other hammer toe (acquired) 03/31/2013  . Pneumonia    hx of  . Umbilical hernia     Past Surgical History:  Procedure Laterality Date  . ABDOMINAL HYSTERECTOMY    . ANTERIOR CERVICAL DECOMP/DISCECTOMY FUSION N/A 03/01/2014   Procedure: ANTERIOR CERVICAL DECOMPRESSION/DISCECTOMY FUSION 1 LEVEL;  Surgeon: Charlie Pitter, MD;  Location: [facility];  Service: Neurosurgery;  Laterality: N/A;  ANTERIOR CERVICAL DECOMPRESSION/DISCECTOMY FUSION 1 LEVEL  . BUNIONECTOMY Right 10/14/2014   @[facility]   . CESAREAN SECTION    . COLON SURGERY    . COLONOSCOPY    . FOOT SURGERY Right   . GASTRIC BYPASS    . Hammer Toe Repair Right 10/14/2014   RT #2, @[facility]   . TENOTOMY Right 10/14/2014   RT #3, @[facility]     Social History   Socioeconomic History  . Marital status: Single    Spouse name: Not on file  . Number of children: Not on file  . Years of education: Not on file  . Highest education level: Not on file  Occupational History  . Not on file  Social Needs  . Financial resource strain: Not hard at all  . Food insecurity:    Worry: Never true    Inability: Never true  . Transportation needs:    Medical: No    Non-medical: No  Tobacco Use  . Smoking status: Current Some Day Smoker    Packs/day: 0.25    Years: 20.00    Pack years: 5.00    Types: Cigarettes  . Smokeless tobacco: Never Used  Substance and Sexual Activity  . Alcohol use: Yes    Alcohol/week: 0.0 oz    Comment: 1/2 pint daily  . Drug use: No  . Sexual activity: Yes    Partners: Male  Lifestyle  . Physical activity:    Days per week: Not on file    Minutes per session: Not on file  . Stress: Not on file  Relationships    . Social connections:    Talks on phone: Not on file    Gets together: Not on file    Attends religious service: Not on file    Active member of club or organization: Not on file    Attends meetings of clubs or organizations: Not on file    Relationship status: Not on file  Other Topics Concern  . Not on file  Social History Narrative  . Not on file  Family History  Problem Relation Age of Onset  . Stroke Mother   . Hypertension Mother   . Hyperlipidemia Mother   . Diabetes Mother   . Diabetes Brother   . Hypertension Brother   . Hypertension Brother   . Hypertension Brother   . Hypertension Brother   . Hypertension Brother   . Alcoholism Brother     Review of Systems  Constitutional: Positive for fatigue. Negative for chills and fever.  Respiratory: Negative for cough, shortness of breath and wheezing.   Cardiovascular: Positive for leg swelling (mild). Negative for chest pain and palpitations.  Neurological: Negative for light-headedness and headaches.       Objective:   Vitals:   10/15/17 1512  BP: (!) 144/72  Pulse: 77  Resp: 16  Temp: 98.4 F (36.9 C)  SpO2: 99%   BP Readings from Last 3 Encounters:  10/15/17 (!) 144/72  10/15/17 (!) 144/72  09/16/17 122/60   Wt Readings from Last 3 Encounters:  10/15/17 249 lb (112.9 kg)  10/15/17 249 lb (112.9 kg)  09/16/17 244 lb (110.7 kg)   Body mass index is 40.19 kg/m.   Physical Exam    Constitutional: Appears well-developed and well-nourished. No distress.  HENT:  Head: Normocephalic and atraumatic.  Neck: Neck supple. No tracheal deviation present. No thyromegaly present.  No cervical lymphadenopathy Cardiovascular: Normal rate, regular rhythm and normal heart sounds.   No murmur heard. No carotid bruit .  Mild bilateral lower extremity edema Pulmonary/Chest: Effort normal and breath sounds normal. No respiratory distress. No has no wheezes. No rales.  Skin: Skin is warm and dry. Not diaphoretic.   Psychiatric: Normal mood and affect. Behavior is normal.      Assessment & Plan:    See Problem List for Assessment and Plan of chronic medical problems.

## 2017-10-15 NOTE — Assessment & Plan Note (Signed)
BP Readings from Last 3 Encounters:  10/15/17 (!) 144/72  10/15/17 (!) 144/72  09/16/17 122/60   BP overall controlled Continue current medication at current doses

## 2017-10-15 NOTE — Patient Instructions (Addendum)
[email or web address] or down load app on smart phone  Aunt Berenice Primas website lists multiple social resources for individuals such as: food, health, money, house hold goods, transit, medical supplies, job training and legal services.  Continue doing brain stimulating activities (puzzles, reading, adult coloring books, staying active) to keep memory sharp.   Continue to eat heart healthy diet (full of fruits, vegetables, whole grains, lean protein, water--limit salt, fat, and sugar intake) and increase physical activity as tolerated.   Misty [name] , Thank you for taking time to come for your Medicare Wellness Visit. I appreciate your ongoing commitment to your health goals. Please review the following plan we discussed and let me know if I can assist you in the future.   These are the goals we discussed: Goals    . Patient Stated     I want to work towards getting my pain under control so I think about going back to work.       This is a list of the screening recommended for you and due dates:  Health Maintenance  Topic Date Due  . Tetanus Vaccine  04/10/1980  . Mammogram  08/10/2016  . Flu Shot  10/17/2017  . Colon Cancer Screening  09/08/2024  .  Hepatitis C: One time screening is recommended by Center for Disease Control  (CDC) for  adults born from [birth year] through 1964.   Completed  . HIV Screening  Completed   Health Maintenance, Female Adopting a healthy lifestyle and getting preventive care can go a long way to promote health and wellness. Talk with your health care provider about what schedule of regular examinations is right for you. This is a good chance for you to check in with your provider about disease prevention and staying healthy. In between checkups, there are plenty of things you can do on your own. Experts have done a lot of research about which lifestyle changes and preventive measures are most likely to keep you healthy. Ask your health care provider for more  information. Weight and diet Eat a healthy diet  Be sure to include plenty of vegetables, fruits, low-fat dairy products, and lean protein.  Do not eat a lot of foods high in solid fats, added sugars, or salt.  Get regular exercise. This is one of the most important things you can do for your health. ? Most adults should exercise for at least 150 minutes each week. The exercise should increase your heart rate and make you sweat (moderate-intensity exercise). ? Most adults should also do strengthening exercises at least twice a week. This is in addition to the moderate-intensity exercise.  Maintain a healthy weight  Body mass index (BMI) is a measurement that can be used to identify possible weight problems. It estimates body fat based on height and weight. Your health care provider can help determine your BMI and help you achieve or maintain a healthy weight.  For females 67 years of age and older: ? A BMI below 18.5 is considered underweight. ? A BMI of 18.5 to 24.9 is normal. ? A BMI of 25 to 29.9 is considered overweight. ? A BMI of 30 and above is considered obese.  Watch levels of cholesterol and blood lipids  You should start having your blood tested for lipids and cholesterol at 56 years of age, then have this test every 5 years.  You may need to have your cholesterol levels checked more often if: ? Your lipid or cholesterol levels are high. ? You are  older than 56 years of age. ? You are at high risk for heart disease.  Cancer screening Lung Cancer  Lung cancer screening is recommended for adults 53-54 years old who are at high risk for lung cancer because of a history of smoking.  A yearly low-dose CT scan of the lungs is recommended for people who: ? Currently smoke. ? Have quit within the past 15 years. ? Have at least a 30-pack-year history of smoking. A pack year is smoking an average of one pack of cigarettes a day for 1 year.  Yearly screening should continue  until it has been 15 years since you quit.  Yearly screening should stop if you develop a health problem that would prevent you from having lung cancer treatment.  Breast Cancer  Practice breast self-awareness. This means understanding how your breasts normally appear and feel.  It also means doing regular breast self-exams. Let your health care provider know about any changes, no matter how small.  If you are in your 20s or 30s, you should have a clinical breast exam (CBE) by a health care provider every 1-3 years as part of a regular health exam.  If you are 31 or older, have a CBE every year. Also consider having a breast X-ray (mammogram) every year.  If you have a family history of breast cancer, talk to your health care provider about genetic screening.  If you are at high risk for breast cancer, talk to your health care provider about having an MRI and a mammogram every year.  Breast cancer gene (BRCA) assessment is recommended for women who have family members with BRCA-related cancers. BRCA-related cancers include: ? Breast. ? Ovarian. ? Tubal. ? Peritoneal cancers.  Results of the assessment will determine the need for genetic counseling and BRCA1 and BRCA2 testing.  Cervical Cancer Your health care provider may recommend that you be screened regularly for cancer of the pelvic organs (ovaries, uterus, and vagina). This screening involves a pelvic examination, including checking for microscopic changes to the surface of your cervix (Pap test). You may be encouraged to have this screening done every 3 years, beginning at age 75.  For women ages 69-65, health care providers may recommend pelvic exams and Pap testing every 3 years, or they may recommend the Pap and pelvic exam, combined with testing for human papilloma virus (HPV), every 5 years. Some types of HPV increase your risk of cervical cancer. Testing for HPV may also be done on women of any age with unclear Pap test  results.  Other health care providers may not recommend any screening for nonpregnant women who are considered low risk for pelvic cancer and who do not have symptoms. Ask your health care provider if a screening pelvic exam is right for you.  If you have had past treatment for cervical cancer or a condition that could lead to cancer, you need Pap tests and screening for cancer for at least 20 years after your treatment. If Pap tests have been discontinued, your risk factors (such as having a new sexual partner) need to be reassessed to determine if screening should resume. Some women have medical problems that increase the chance of getting cervical cancer. In these cases, your health care provider may recommend more frequent screening and Pap tests.  Colorectal Cancer  This type of cancer can be detected and often prevented.  Routine colorectal cancer screening usually begins at 56 years of age and continues through 56 years of age.  Your health care provider may recommend screening at an earlier age if you have risk factors for colon cancer.  Your health care provider may also recommend using home test kits to check for hidden blood in the stool.  A small camera at the end of a tube can be used to examine your colon directly (sigmoidoscopy or colonoscopy). This is done to check for the earliest forms of colorectal cancer.  Routine screening usually begins at age 27.  Direct examination of the colon should be repeated every 5-10 years through 56 years of age. However, you may need to be screened more often if early forms of precancerous polyps or small growths are found.  Skin Cancer  Check your skin from head to toe regularly.  Tell your health care provider about any new moles or changes in moles, especially if there is a change in a mole's shape or color.  Also tell your health care provider if you have a mole that is larger than the size of a pencil eraser.  Always use sunscreen.  Apply sunscreen liberally and repeatedly throughout the day.  Protect yourself by wearing long sleeves, pants, a wide-brimmed hat, and sunglasses whenever you are outside.  Heart disease, diabetes, and high blood pressure  High blood pressure causes heart disease and increases the risk of stroke. High blood pressure is more likely to develop in: ? People who have blood pressure in the high end of the normal range (130-139/85-89 mm Hg). ? People who are overweight or obese. ? People who are African American.  If you are 63-36 years of age, have your blood pressure checked every 3-5 years. If you are 63 years of age or older, have your blood pressure checked every year. You should have your blood pressure measured twice-once when you are at a hospital or clinic, and once when you are not at a hospital or clinic. Record the average of the two measurements. To check your blood pressure when you are not at a hospital or clinic, you can use: ? An automated blood pressure machine at a pharmacy. ? A home blood pressure monitor.  If you are between 50 years and 61 years old, ask your health care provider if you should take aspirin to prevent strokes.  Have regular diabetes screenings. This involves taking a blood sample to check your fasting blood sugar level. ? If you are at a normal weight and have a low risk for diabetes, have this test once every three years after 56 years of age. ? If you are overweight and have a high risk for diabetes, consider being tested at a younger age or more often. Preventing infection Hepatitis B  If you have a higher risk for hepatitis B, you should be screened for this virus. You are considered at high risk for hepatitis B if: ? You were born in a country where hepatitis B is common. Ask your health care provider which countries are considered high risk. ? Your parents were born in a high-risk country, and you have not been immunized against hepatitis B (hepatitis B  vaccine). ? You have HIV or AIDS. ? You use needles to inject street drugs. ? You live with someone who has hepatitis B. ? You have had sex with someone who has hepatitis B. ? You get hemodialysis treatment. ? You take certain medicines for conditions, including cancer, organ transplantation, and autoimmune conditions.  Hepatitis C  Blood testing is recommended for: ? Everyone born from [birth year] through  1965. ? Anyone with known risk factors for hepatitis C.  Sexually transmitted infections (STIs)  You should be screened for sexually transmitted infections (STIs) including gonorrhea and chlamydia if: ? You are sexually active and are younger than 56 years of age. ? You are older than 56 years of age and your health care provider tells you that you are at risk for this type of infection. ? Your sexual activity has changed since you were last screened and you are at an increased risk for chlamydia or gonorrhea. Ask your health care provider if you are at risk.  If you do not have HIV, but are at risk, it may be recommended that you take a prescription medicine daily to prevent HIV infection. This is called pre-exposure prophylaxis (PrEP). You are considered at risk if: ? You are sexually active and do not regularly use condoms or know the HIV status of your partner(s). ? You take drugs by injection. ? You are sexually active with a partner who has HIV.  Talk with your health care provider about whether you are at high risk of being infected with HIV. If you choose to begin PrEP, you should first be tested for HIV. You should then be tested every 3 months for as long as you are taking PrEP. Pregnancy  If you are premenopausal and you may become pregnant, ask your health care provider about preconception counseling.  If you may become pregnant, take 400 to 800 micrograms (mcg) of folic acid every day.  If you want to prevent pregnancy, talk to your health care provider about birth control  (contraception). Osteoporosis and menopause  Osteoporosis is a disease in which the bones lose minerals and strength with aging. This can result in serious bone fractures. Your risk for osteoporosis can be identified using a bone density scan.  If you are 25 years of age or older, or if you are at risk for osteoporosis and fractures, ask your health care provider if you should be screened.  Ask your health care provider whether you should take a calcium or vitamin D supplement to lower your risk for osteoporosis.  Menopause may have certain physical symptoms and risks.  Hormone replacement therapy may reduce some of these symptoms and risks. Talk to your health care provider about whether hormone replacement therapy is right for you. Follow these instructions at home:  Schedule regular health, dental, and eye exams.  Stay current with your immunizations.  Do not use any tobacco products including cigarettes, chewing tobacco, or electronic cigarettes.  If you are pregnant, do not drink alcohol.  If you are breastfeeding, limit how much and how often you drink alcohol.  Limit alcohol intake to no more than 1 drink per day for nonpregnant women. One drink equals 12 ounces of beer, 5 ounces of [place]ett, or 1 ounces of hard liquor.  Do not use street drugs.  Do not share needles.  Ask your health care provider for help if you need support or information about quitting drugs.  Tell your health care provider if you often feel depressed.  Tell your health care provider if you have ever been abused or do not feel safe at home. This information is not intended to replace advice given to you by your health care provider. Make sure you discuss any questions you have with your health care provider. Document Released: 09/18/2010 Document Revised: 08/11/2015 Document Reviewed: 12/07/2014 Elsevier Interactive Patient Education  [employer]. It is important to avoid accidents  which may  result in broken bones.  Here are a few ideas on how to make your home safer so you will be less likely to trip or fall.  1. Use nonskid mats or non slip strips in your shower or tub, on your bathroom floor and around sinks.  If you know that you have spilled water, wipe it up! 2. In the bathroom, it is important to have properly installed grab bars on the walls or on the edge of the tub.  Towel racks are NOT strong enough for you to hold onto or to pull on for support. 3. Stairs and hallways should have enough light.  Add lamps or night lights if you need ore light. 4. It is good to have handrails on both sides of the stairs if possible.  Always fix broken handrails right away. 5. It is important to see the edges of steps.  Paint the edges of outdoor steps white so you can see them better.  Put colored tape on the edge of inside steps. 6. Throw-rugs are dangerous because they can slide.  Removing the rugs is the best idea, but if they must stay, add adhesive carpet tape to prevent slipping. 7. Do not keep things on stairs or in the halls.  Remove small furniture that blocks the halls as it may cause you to trip.  Keep telephone and electrical cords out of the way where you walk. 8. Always were sturdy, rubber-soled shoes for good support.  Never wear just socks, especially on the stairs.  Socks may cause you to slip or fall.  Do not wear full-length housecoats as you can easily trip on the bottom.  9. Place the things you use the most on the shelves that are the easiest to reach.  If you use a stepstool, make sure it is in good condition.  If you feel unsteady, DO NOT climb, ask for help. 10. If a health professional advises you to use a cane or walker, do not be ashamed.  These items can keep you from falling and breaking your bones.

## 2017-10-15 NOTE — Assessment & Plan Note (Signed)
Check a1c Low sugar / carb diet Stressed regular exercise, weight loss  

## 2017-10-15 NOTE — Patient Instructions (Addendum)
  Test(s) ordered today. Your results will be released to [place] (or called to you) after review, usually within 72hours after test completion. If any changes need to be made, you will be notified at that same time.  B12 injection today.   Medications reviewed and updated.  No changes recommended at this time.   Please followup in 6 months

## 2017-10-15 NOTE — Assessment & Plan Note (Signed)
Management per pain management Encouraged her to be as active as possible

## 2017-10-15 NOTE — Assessment & Plan Note (Signed)
B12 injections monthly Due today - given

## 2017-10-15 NOTE — Assessment & Plan Note (Signed)
Check vitamin d level

## 2017-10-23 DIAGNOSIS — Z79891 Long term (current) use of opiate analgesic: Secondary | ICD-10-CM | POA: Diagnosis not present

## 2017-10-23 DIAGNOSIS — G894 Chronic pain syndrome: Secondary | ICD-10-CM | POA: Diagnosis not present

## 2017-10-23 DIAGNOSIS — M545 Low back pain: Secondary | ICD-10-CM | POA: Diagnosis not present

## 2017-10-29 ENCOUNTER — Ambulatory Visit (INDEPENDENT_AMBULATORY_CARE_PROVIDER_SITE_OTHER): Payer: Medicare Other | Admitting: Podiatry

## 2017-10-29 ENCOUNTER — Encounter: Payer: Self-pay | Admitting: Podiatry

## 2017-10-29 DIAGNOSIS — M2041 Other hammer toe(s) (acquired), right foot: Secondary | ICD-10-CM

## 2017-10-29 DIAGNOSIS — Q828 Other specified congenital malformations of skin: Secondary | ICD-10-CM

## 2017-10-29 DIAGNOSIS — M79674 Pain in right toe(s): Secondary | ICD-10-CM | POA: Diagnosis not present

## 2017-10-29 DIAGNOSIS — B351 Tinea unguium: Secondary | ICD-10-CM

## 2017-10-29 DIAGNOSIS — M79675 Pain in left toe(s): Secondary | ICD-10-CM

## 2017-10-29 DIAGNOSIS — M2042 Other hammer toe(s) (acquired), left foot: Secondary | ICD-10-CM

## 2017-10-29 NOTE — Progress Notes (Signed)
This patient presents the office with chief complaint of continued pain noted from the callus on both feet. She states that the callus becomes very painful walking and wearing her shoes. She says she has  had previous surgery in an attempt to eliminate her pain but her pain persists.  She now says that she presents the office routinely for treatment of her painful calluses on both feet   GENERAL APPEARANCE: Alert, conversant. Appropriately groomed. No acute distress.  VASCULAR: Pedal pulses are  palpable at  [facility] and PT bilateral.  Capillary refill time is immediate to all digits,  Normal temperature gradient.   NEUROLOGIC: sensation is normal to 5.07 monofilament at 5/5 sites bilateral.  Light touch is intact bilateral, Muscle strength normal.  MUSCULOSKELETAL: acceptable muscle strength, tone and stability bilateral.  Intrinsic muscluature intact bilateral.  Rectus appearance of foot and digits noted bilateral. Heloma durum 5th toe left foot.  Painful distal clavi second toe right foot.  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  DERMATOLOGIC: skin color, texture, and turgor are within normal limits.  No preulcerative lesions or ulcers  are seen, no interdigital maceration noted.  No open lesions present.  Digital nails are asymptomatic. No drainage noted.Porokeratosis sub 3,5 right  foot and 4 left foot. Pinch callus right foot.  Distal clavi second right.  Porokeratosis  B/l  Onychomycosis  B/L  Debridement of porokeratosis  B/L.  Debridement of onychomycosis  B/L.   RTC 10 weeks.  ABN signed for 2019.  Gardiner Barefoot DPM

## 2017-11-15 ENCOUNTER — Encounter: Payer: Self-pay | Admitting: Family Medicine

## 2017-11-15 ENCOUNTER — Ambulatory Visit: Payer: Self-pay

## 2017-11-15 ENCOUNTER — Ambulatory Visit (INDEPENDENT_AMBULATORY_CARE_PROVIDER_SITE_OTHER): Payer: Medicare Other | Admitting: Family Medicine

## 2017-11-15 ENCOUNTER — Ambulatory Visit: Payer: Medicare Other

## 2017-11-15 VITALS — BP 132/86 | HR 96 | Ht 66.0 in | Wt 253.0 lb

## 2017-11-15 DIAGNOSIS — M25511 Pain in right shoulder: Secondary | ICD-10-CM | POA: Diagnosis not present

## 2017-11-15 DIAGNOSIS — E538 Deficiency of other specified B group vitamins: Secondary | ICD-10-CM

## 2017-11-15 MED ORDER — CYANOCOBALAMIN 1000 MCG/ML IJ SOLN
1000.0000 ug | Freq: Once | INTRAMUSCULAR | Status: AC
  Administered 2017-11-15: 1000 ug via INTRAMUSCULAR

## 2017-11-15 NOTE — Progress Notes (Signed)
Misty [name] - 57 y.o. female MRN [id number]  Date of birth: 1960-05-24  SUBJECTIVE:  Including CC & ROS.  Chief Complaint  Patient presents with  . Shoulder Pain    Misty [name] is a 57 y.o. female that is presenting with right shoulder pain.  Ongoing for one week. Pain is located anterior aspect and radiates posteriorly. Pain is worse during abduction. Mild to severe pain. Denies tingling and numbness. Denies injury or trauma. No radicular symptoms. No inciting event.    She has a history of C5-6 fusion.   Review of Systems  Constitutional: Negative for fever.  HENT: Negative for congestion.   Respiratory: Negative for cough.   Cardiovascular: Negative for chest pain.  Gastrointestinal: Negative for abdominal pain.  Musculoskeletal: Positive for back pain and gait problem.  Skin: Negative for color change.  Neurological: Negative for weakness.  Hematological: Negative for adenopathy.  Psychiatric/Behavioral: Negative for agitation.    HISTORY: Past Medical, Surgical, Social, and Family History Reviewed & Updated per EMR.   Pertinent Historical Findings include:  Past Medical History:  Diagnosis Date  . Allergy   . Arthritis   . Back pain   . Colon cancer ([place])   . Depression   . Diarrhea   . GERD (gastroesophageal reflux disease)   . Hypertension   . Insomnia   . MS (multiple sclerosis) ([place])   . Other hammer toe (acquired) 03/31/2013  . Pneumonia    hx of  . Umbilical hernia     Past Surgical History:  Procedure Laterality Date  . ABDOMINAL HYSTERECTOMY    . ANTERIOR CERVICAL DECOMP/DISCECTOMY FUSION N/A 03/01/2014   Procedure: ANTERIOR CERVICAL DECOMPRESSION/DISCECTOMY FUSION 1 LEVEL;  Surgeon: Charlie Pitter, MD;  Location: [facility];  Service: Neurosurgery;  Laterality: N/A;  ANTERIOR CERVICAL DECOMPRESSION/DISCECTOMY FUSION 1 LEVEL  . BUNIONECTOMY Right 10/14/2014   @[facility]   . CESAREAN SECTION    . COLON SURGERY    . COLONOSCOPY    . FOOT SURGERY  Right   . GASTRIC BYPASS    . Hammer Toe Repair Right 10/14/2014   RT #2, @[facility]   . TENOTOMY Right 10/14/2014   RT #3, @[facility]     Allergies  Allergen Reactions  . Phenergan [Promethazine Hcl] Anxiety  . Promethazine Anxiety  . Trazodone And Nefazodone Rash    Family History  Problem Relation Age of Onset  . Stroke Mother   . Hypertension Mother   . Hyperlipidemia Mother   . Diabetes Mother   . Diabetes Brother   . Hypertension Brother   . Hypertension Brother   . Hypertension Brother   . Hypertension Brother   . Hypertension Brother   . Alcoholism Brother      Social History   Socioeconomic History  . Marital status: Single    Spouse name: Not on file  . Number of children: 2  . Years of education: Not on file  . Highest education level: Not on file  Occupational History  . Not on file  Social Needs  . Financial resource strain: Not hard at all  . Food insecurity:    Worry: Never true    Inability: Never true  . Transportation needs:    Medical: No    Non-medical: No  Tobacco Use  . Smoking status: Current Some Day Smoker    Packs/day: 0.25    Years: 20.00    Pack years: 5.00    Types: Cigarettes  . Smokeless tobacco: Never Used  Substance and  Sexual Activity  . Alcohol use: Yes    Alcohol/week: 0.0 standard drinks    Comment: 1/2 pint daily  . Drug use: No  . Sexual activity: Yes    Partners: Male  Lifestyle  . Physical activity:    Days per week: 0 days    Minutes per session: 0 min  . Stress: To some extent  Relationships  . Social connections:    Talks on phone: More than three times a week    Gets together: More than three times a week    Attends religious service: Not on file    Active member of club or organization: Not on file    Attends meetings of clubs or organizations: Not on file    Relationship status: Not on file  . Intimate partner violence:    Fear of current or ex partner: Not on file    Emotionally abused: Not on file     Physically abused: Not on file    Forced sexual activity: Not on file  Other Topics Concern  . Not on file  Social History Narrative  . Not on file     PHYSICAL EXAM:  VS: BP 132/86 (BP Location: Left Arm, Patient Position: Sitting, Cuff Size: Normal)   Pulse 96   Ht 5\' 6"  (1.676 m)   Wt 253 lb (114.8 kg)   SpO2 98%   BMI 40.84 kg/m  Physical Exam Gen: NAD, alert, cooperative with exam, well-appearing ENT: normal lips, normal nasal mucosa,  Eye: normal EOM, normal conjunctiva and lids CV:  no edema, +2 pedal pulses   Resp: no accessory muscle use, non-labored,  Skin: no rashes, no areas of induration  Neuro: normal tone, normal sensation to touch Psych:  normal insight, alert and oriented MSK:  Right Shoulder: Inspection reveals no abnormalities, atrophy or asymmetry. Palpation is normal with no tenderness over AC joint Normal ER  No pain with ER and abduction  Rotator cuff strength normal throughout. Pain with Hawkin's tests, empty can sign. Normal scapular function observed. Neurovascularly intact    Limited ultrasound: right shoulder:  Normal BT  Supraspinatus with tendinopathy changes and subacromial bursitis  AC joint with effusion and degenerative changes   Summary: subacromial bursitis, supraspinatus tendinopathy, AC joint arthritis.   Ultrasound and interpretation by Clearance Coots, MD    ASSESSMENT & PLAN:   Acute pain of right shoulder Pain seems to be subacromial bursitis. Doesn't appear to be radicular in nature.  - subacromial injection today  - counseled on HEP  - counseled on supportive care  - if no improvement consider imaging, PT

## 2017-11-15 NOTE — Assessment & Plan Note (Signed)
Pain seems to be subacromial bursitis. Doesn't appear to be radicular in nature.  - subacromial injection today  - counseled on HEP  - counseled on supportive care  - if no improvement consider imaging, PT

## 2017-11-15 NOTE — Patient Instructions (Signed)
Good to see you  Please try ice on the area  Please try the exercises  Please try a muscle rub like Aspercreme with lidocaine Please follow up with if your symptoms don't improve.

## 2017-11-20 ENCOUNTER — Ambulatory Visit: Payer: Self-pay | Admitting: *Deleted

## 2017-11-20 MED ORDER — CLONAZEPAM 0.5 MG PO TABS: 0.5000 mg | ORAL_TABLET | Freq: Two times a day (BID) | ORAL | 0 refills | Status: DC | PRN

## 2017-11-20 NOTE — Addendum Note (Signed)
Addended by: [name] on: 11/20/2017 08:36 PM   Modules accepted: Orders

## 2017-11-20 NOTE — Telephone Encounter (Signed)
Give my condolences.    Lets try clonazepam - it is longer acting and better for anxiety that may last a little longer.  She should follow up with me ideally sometime in the next month but it does not need to be right away.    Clonazepam sent to [facility].

## 2017-11-20 NOTE — Telephone Encounter (Signed)
Pt states her son died Monday, unexpected. States she has been anxious, angry.Marland KitchenMarland Kitchen"Detectives haven't told me much."  Unable to sleep, did eat "A little" today.  States biggest issue is lack of sleep. Denies any physical symptoms except headache "Behind my eyes." Reports Dr. Quay Burow prescribed Xanax in July as she was traveling via plane. States effective. Questioning if Dr. Quay Burow would call in RX for Xanax. States will come in for appt if necessary but "Schedule full trying to make arrangements and with the police." Please advise: [phone]  Reason for Disposition . Symptoms interfere with work or school  Answer Assessment - Initial Assessment Questions 1. CONCERN: "What happened that made you call today?"     Son died 2022-05-24. ANXIETY SYMPTOM SCREENING: "Can you describe how you have been feeling?"  (e.g., tense, restless, panicky, anxious, keyed up, trouble sleeping, trouble concentrating)     Anxiety, anger, not sleeping 3. ONSET: "How long have you been feeling this way?"     Since  Monday 4. RECURRENT: "Have you felt this way before?"  If yes: "What happened that time?" "What helped these feelings go away in the past?"      No 5. RISK OF HARM - SUICIDAL IDEATION:  "Do you ever have thoughts of hurting or killing yourself?"  (e.g., yes, no, no but preoccupation with thoughts about death)   - INTENT:  "Do you have thoughts of hurting or killing yourself right NOW?" (e.g., yes, no, N/A)   - PLAN: "Do you have a specific plan for how you would do this?" (e.g., gun, knife, overdose, no plan, N/A)     no 6. RISK OF HARM - HOMICIDAL IDEATION:  "Do you ever have thoughts of hurting or killing someone else?"  (e.g., yes, no, no but preoccupation with thoughts about death)   - INTENT:  "Do you have thoughts of hurting or killing someone right NOW?" (e.g., yes, no, N/A)   - PLAN: "Do you have a specific plan for how you would do this?" (e.g., gun, knife, no plan, N/A)      no 7. FUNCTIONAL  IMPAIRMENT: "How have things been going for you overall in your life? Have you had any more difficulties than usual doing your normal daily activities?"  (e.g., better, same, worse; self-care, school, work, interactions)     Ate a little today, can't function 8. SUPPORT: "Who is with you now?" "Who do you live with?" "Do you have family or friends nearby who you can talk to?"      Supportive family and friends 27. THERAPIST: "Do you have a counselor or therapist? Name?"     no 10. STRESSORS: "Has there been any new stress or recent changes in your life?"       Death of son 60. CAFFEINE ABUSE: "Do you drink caffeinated beverages, and how much each day?" (e.g., coffee, tea, colas)       no 12. SUBSTANCE ABUSE: "Do you use any illegal drugs or alcohol?"       no 13. OTHER SYMPTOMS: "Do you have any other physical symptoms right now?" (e.g., chest pain, palpitations, difficulty breathing, fever)       Pain in back of my eyes  Protocols used: ANXIETY AND PANIC ATTACK-A-AH

## 2017-11-21 DIAGNOSIS — Z79891 Long term (current) use of opiate analgesic: Secondary | ICD-10-CM | POA: Diagnosis not present

## 2017-11-21 DIAGNOSIS — G894 Chronic pain syndrome: Secondary | ICD-10-CM | POA: Diagnosis not present

## 2017-11-21 DIAGNOSIS — M545 Low back pain: Secondary | ICD-10-CM | POA: Diagnosis not present

## 2017-11-21 NOTE — Telephone Encounter (Signed)
Pt aware of response below. Will try to the clonazepam. Also stated her blood pressure was running high at pain doctor and she has had pressure behind her eyes. I advised her to keep check on BP at home and if it stays high to come in and be seen ASAP. Pt understood.

## 2017-12-09 ENCOUNTER — Telehealth: Payer: Self-pay | Admitting: Internal Medicine

## 2017-12-09 NOTE — Telephone Encounter (Signed)
I do not have any specific name -   [place] [facility] [place]  [address] [place], [place]  [facility] [place], [place], [place], [id number] [phone] Office [phone] [facility]            [place], [place]  [place]    - walk ins only for initial viist   M-F  8am - 3pm [address] [place], [place]

## 2017-12-09 NOTE — Telephone Encounter (Signed)
Gave pt info below.

## 2017-12-09 NOTE — Telephone Encounter (Signed)
Copied from [place] [id number]. Topic: General - Other >> Dec 09, 2017  2:18 PM Lennox Solders wrote: Reason for CRM: pt is calling and would like to see a grief counsel and would like dr burns recommendation. Pt lost her son 2 wks ago  and cause of death is still unknown. Pt can not sleep nor is she staying in her home. Pt is still taking the medication md prescribed

## 2017-12-09 NOTE — Telephone Encounter (Signed)
Please advise 

## 2017-12-10 ENCOUNTER — Telehealth: Payer: Self-pay

## 2017-12-10 NOTE — Telephone Encounter (Deleted)
Copied from [place] [id number]. Topic: General - Other >> Sep 18, 2017 11:54 AM Keene Breath wrote: Reason for CRM: Patient called to state that she is still having severe abdominal pain.  She would like to know what she can do to relieve the pain.  Would like a call back to discuss her options.  CB# [phone].  Left message advising patient that PA has been approved, however I still am waiting on copay amount after insurance sends summary of benefits, she may or may not have copay, I won't know until I get that information back---I will call patient as soon as I hear information from insurance co

## 2017-12-19 DIAGNOSIS — G894 Chronic pain syndrome: Secondary | ICD-10-CM | POA: Diagnosis not present

## 2017-12-19 DIAGNOSIS — Z79891 Long term (current) use of opiate analgesic: Secondary | ICD-10-CM | POA: Diagnosis not present

## 2017-12-19 DIAGNOSIS — M545 Low back pain: Secondary | ICD-10-CM | POA: Diagnosis not present

## 2017-12-26 ENCOUNTER — Ambulatory Visit: Payer: Medicare Other

## 2018-01-01 ENCOUNTER — Ambulatory Visit: Payer: Medicare Other

## 2018-01-07 ENCOUNTER — Encounter: Payer: Self-pay | Admitting: Podiatry

## 2018-01-07 ENCOUNTER — Ambulatory Visit (INDEPENDENT_AMBULATORY_CARE_PROVIDER_SITE_OTHER): Payer: Medicare Other | Admitting: Podiatry

## 2018-01-07 DIAGNOSIS — M2042 Other hammer toe(s) (acquired), left foot: Secondary | ICD-10-CM

## 2018-01-07 DIAGNOSIS — M2041 Other hammer toe(s) (acquired), right foot: Secondary | ICD-10-CM

## 2018-01-07 DIAGNOSIS — Q828 Other specified congenital malformations of skin: Secondary | ICD-10-CM | POA: Diagnosis not present

## 2018-01-07 NOTE — Progress Notes (Signed)
This patient presents the office with chief complaint of continued pain noted from the callus on both feet. She states that the callus becomes very painful walking and wearing her shoes. She says she has  had previous surgery in an attempt to eliminate her pain but her pain persists.  She now says that she presents the office routinely for treatment of her painful calluses on both feet   GENERAL APPEARANCE: Alert, conversant. Appropriately groomed. No acute distress.  VASCULAR: Pedal pulses are  palpable at  [facility] and PT bilateral.  Capillary refill time is immediate to all digits,  Normal temperature gradient.   NEUROLOGIC: sensation is normal to 5.07 monofilament at 5/5 sites bilateral.  Light touch is intact bilateral, Muscle strength normal.  MUSCULOSKELETAL: acceptable muscle strength, tone and stability bilateral.  Intrinsic muscluature intact bilateral.  Rectus appearance of foot and digits noted bilateral. Heloma durum 5th toe left foot.  Painful distal clavi second toe right foot.  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  DERMATOLOGIC: skin color, texture, and turgor are within normal limits.  No preulcerative lesions or ulcers  are seen, no interdigital maceration noted.  No open lesions present.  Digital nails are asymptomatic. No drainage noted.Porokeratosis sub 3,5 right  foot and 4 left foot. Pinch callus right foot.  Distal clavi second right.  Porokeratosis  B/l  Onychomycosis  B/L  Debridement of porokeratosis  B/L   RTC 10 weeks.  ABN signed for 2019.  Gardiner Barefoot DPM

## 2018-01-13 ENCOUNTER — Encounter: Payer: Self-pay | Admitting: Internal Medicine

## 2018-01-13 ENCOUNTER — Ambulatory Visit: Payer: Self-pay

## 2018-01-13 ENCOUNTER — Ambulatory Visit (INDEPENDENT_AMBULATORY_CARE_PROVIDER_SITE_OTHER): Payer: Medicare Other | Admitting: Internal Medicine

## 2018-01-13 ENCOUNTER — Ambulatory Visit: Payer: Medicare Other | Admitting: Internal Medicine

## 2018-01-13 VITALS — BP 110/62 | HR 81 | Temp 98.1°F | Resp 16 | Ht 66.0 in | Wt 252.8 lb

## 2018-01-13 DIAGNOSIS — Z23 Encounter for immunization: Secondary | ICD-10-CM | POA: Diagnosis not present

## 2018-01-13 DIAGNOSIS — E538 Deficiency of other specified B group vitamins: Secondary | ICD-10-CM

## 2018-01-13 DIAGNOSIS — M25511 Pain in right shoulder: Secondary | ICD-10-CM | POA: Diagnosis not present

## 2018-01-13 MED ORDER — CYANOCOBALAMIN 1000 MCG/ML IJ SOLN
1000.0000 ug | Freq: Once | INTRAMUSCULAR | Status: AC
  Administered 2018-01-13: 1000 ug via INTRAMUSCULAR

## 2018-01-13 NOTE — Telephone Encounter (Signed)
FYI being seen today at 10:45

## 2018-01-13 NOTE — Patient Instructions (Addendum)
Make an appointment with sports medicine.   Continue your current pain medications.     Flu immunization and B12 injection administered today.

## 2018-01-13 NOTE — Assessment & Plan Note (Signed)
She has been seen by Dr. Raeford Razor in August for shoulder pain, but this current episode of shoulder pain started 2 weeks ago-possibly from a fall She has right shoulder pain and decreased range of motion Already on pain medication-no changes to medication made today Will refer to sports medicine for further evaluation and treatment

## 2018-01-13 NOTE — Telephone Encounter (Signed)
Pt called with C/O right arm and shoulder pain x 1 week. Pt states she had a fall about 2 weeks ago but doesn't remember hurting her arm. She states the pain is interfering with her daily activity stating that when she moves the arm a certain way she feels pain and tingling in her arm. She denies chest pain and SOB.  She states she has a bad back and takes pain medications for her back. Appointment made per protocol. Care advice read to patient. Pt verbalized understanding of all instructions.  Reason for Disposition . [1] MODERATE pain (e.g., interferes with normal activities) AND [2] present > 3 days  Answer Assessment - Initial Assessment Questions 1. ONSET: "When did the pain start?"     week 2. LOCATION: "Where is the pain located?"     Rt arm through shoulder 3. PAIN: "How bad is the pain?" (Scale 1-10; or mild, moderate, severe)   - MILD (1-3): doesn't interfere with normal activities   - MODERATE (4-7): interferes with normal activities (e.g., work or school) or awakens from sleep   - SEVERE (8-10): excruciating pain, unable to do any normal activities, unable to hold a cup of water     Moderate 4. WORK OR EXERCISE: "Has there been any recent work or exercise that involved this part of the body?"     no 5. CAUSE: "What do you think is causing the arm pain?"     Fall 2 weeks ago 6. OTHER SYMPTOMS: "Do you have any other symptoms?" (e.g., neck pain, swelling, rash, fever, numbness, weakness)     Hurts and tingles when moving arm 7. PREGNANCY: "Is there any chance you are pregnant?" "When was your last menstrual period?"     No  Protocols used: ARM PAIN-A-AH

## 2018-01-13 NOTE — Progress Notes (Signed)
Subjective:    Patient ID: Misty [name], female    DOB: 05-17-1960, 57 y.o.   MRN: [id number]  HPI The patient is here for an acute visit.   Fall:  She fell two weeks ago.  She was doing too much and picked up a box and got lightheaded and fell backwards.  She landed on her back and buttock.  She got right back up and kept going.  She does not recall injuring her arm.  Since the fall she has been having right arm pain.  She is not able to lift her arm up w/o pain.  The pain is in the back of her shoulder and in her upper arm.  At times she feels pain in her armpit.  It has not gotten any better since it started.  She denies any weakness in the hand.  She is not having any numbness or tingling.  B12 def:  She gets injections monthly and is due for her injection.    Medications and allergies reviewed with patient and updated if appropriate.  Patient Active Problem List   Diagnosis Date Noted  . Acute pain of right shoulder 11/15/2017  . Ventral hernia 09/16/2017  . Acute right flank pain 09/16/2017  . Abdominal pain 09/16/2017  . Greater trochanteric bursitis of right hip 04/30/2017  . Groin pain, right 04/17/2017  . Prediabetes 04/16/2017  . Arm pain, left 01/29/2017  . Numbness 01/29/2017  . Chronic midline low back pain 07/20/2016  . Sacral mass 07/20/2016  . Osteoarthritis of right hip 07/20/2016  . Spinal stenosis of lumbar region 11/29/2015  . Positive urine drug screen 11/16/2015  . Chronic pain syndrome 10/12/2015  . Umbilical hernia 01/17/5944  . Sleep difficulties 07/15/2015  . Bunion, right 06/20/2015  . Callus of foot 04/27/2015  . Muscle spasm 04/16/2015  . B12 deficiency 03/30/2015  . Hammer toe of left foot 02/16/2015  . Fibrosis of skin of lower extremity 02/16/2015  . Status post right foot surgery 10/26/2014  . Cervical spondylosis with radiculopathy 03/01/2014  . Hammer toe of right foot 01/26/2014  . Benign intracranial hypertension 06/18/2013  .  Pain in lower limb 05/22/2013  . Abnormal gait 04/21/2013  . Arthritis 04/21/2013  . Disturbance of skin sensation 04/21/2013  . Cephalalgia 04/21/2013  . Porokeratosis 03/31/2013  . Vitamin D deficiency 04/18/2010  . Fatigue 04/14/2010  . Back pain with right-sided radiculopathy 11/29/2008  . MEDIAL MENISCUS TEAR, LEFT 06/30/2008  . BARRETTS ESOPHAGUS 06/28/2008  . OSTEOARTHRITIS, KNEES, BILATERAL 06/28/2008  . OBESITY 05/24/2008  . HYPERTENSION, BENIGN ESSENTIAL 05/24/2008  . IRRITABLE BOWEL SYNDROME 05/24/2008  . BARIATRIC SURGERY STATUS 03/19/2006  . NEOPLASM, MALIGNANT, COLON, HX OF 03/19/1997    Current Outpatient Medications on File Prior to Visit  Medication Sig Dispense Refill  . clonazePAM (KLONOPIN) 0.5 MG tablet Take 1 tablet (0.5 mg total) by mouth 2 (two) times daily as needed for anxiety. 60 tablet 0  . Cyanocobalamin (VITAMIN B-12 IJ) Inject as directed.    . gabapentin (NEURONTIN) 300 MG capsule One po qAM, two po q PM and 2 po q HS 150 capsule 5  . hydrochlorothiazide (MICROZIDE) 12.5 MG capsule Take 1 capsule (12.5 mg total) by mouth daily. 90 capsule 3  . ibuprofen (ADVIL,MOTRIN) 200 MG tablet Take 200 mg by mouth every 6 (six) hours as needed.    Marland Kitchen lisinopril (PRINIVIL,ZESTRIL) 20 MG tablet     . meloxicam (MOBIC) 15 MG tablet TAKE 1 TABLET BY  MOUTH ONCE DAILY WITH FOOD  2  . naproxen sodium (ANAPROX) 220 MG tablet Take 220 mg by mouth 2 (two) times daily with a meal.    . NARCAN 4 MG/0.1ML LIQD nasal spray kit     . Oxycodone HCl 10 MG TABS     . SSD 1 % cream     . VENTOLIN HFA 108 (90 Base) MCG/ACT inhaler     . XTAMPZA ER 9 MG C12A      No current facility-administered medications on file prior to visit.     Past Medical History:  Diagnosis Date  . Allergy   . Arthritis   . Back pain   . Colon cancer ([place])   . Depression   . Diarrhea   . GERD (gastroesophageal reflux disease)   . Hypertension   . Insomnia   . MS (multiple sclerosis) ([place])   .  Other hammer toe (acquired) 03/31/2013  . Pneumonia    hx of  . Umbilical hernia     Past Surgical History:  Procedure Laterality Date  . ABDOMINAL HYSTERECTOMY    . ANTERIOR CERVICAL DECOMP/DISCECTOMY FUSION N/A 03/01/2014   Procedure: ANTERIOR CERVICAL DECOMPRESSION/DISCECTOMY FUSION 1 LEVEL;  Surgeon: Charlie Pitter, MD;  Location: [facility];  Service: Neurosurgery;  Laterality: N/A;  ANTERIOR CERVICAL DECOMPRESSION/DISCECTOMY FUSION 1 LEVEL  . BUNIONECTOMY Right 10/14/2014   '@[facility]'$   . CESAREAN SECTION    . COLON SURGERY    . COLONOSCOPY    . FOOT SURGERY Right   . GASTRIC BYPASS    . Hammer Toe Repair Right 10/14/2014   RT #2, '@[facility]'$   . TENOTOMY Right 10/14/2014   RT #3, '@[facility]'$     Social History   Socioeconomic History  . Marital status: Single    Spouse name: Not on file  . Number of children: 2  . Years of education: Not on file  . Highest education level: Not on file  Occupational History  . Not on file  Social Needs  . Financial resource strain: Not hard at all  . Food insecurity:    Worry: Never true    Inability: Never true  . Transportation needs:    Medical: No    Non-medical: No  Tobacco Use  . Smoking status: Current Some Day Smoker    Packs/day: 0.25    Years: 20.00    Pack years: 5.00    Types: Cigarettes  . Smokeless tobacco: Never Used  Substance and Sexual Activity  . Alcohol use: Yes    Alcohol/week: 0.0 standard drinks    Comment: 1/2 pint daily  . Drug use: No  . Sexual activity: Yes    Partners: Male  Lifestyle  . Physical activity:    Days per week: 0 days    Minutes per session: 0 min  . Stress: To some extent  Relationships  . Social connections:    Talks on phone: More than three times a week    Gets together: More than three times a week    Attends religious service: Not on file    Active member of club or organization: Not on file    Attends meetings of clubs or organizations: Not on file    Relationship status: Not on file    Other Topics Concern  . Not on file  Social History Narrative  . Not on file    Family History  Problem Relation Age of Onset  . Stroke Mother   . Hypertension Mother   .  Hyperlipidemia Mother   . Diabetes Mother   . Diabetes Brother   . Hypertension Brother   . Hypertension Brother   . Hypertension Brother   . Hypertension Brother   . Hypertension Brother   . Alcoholism Brother     Review of Systems  Constitutional: Negative for chills and fever.  Skin: Negative for color change and wound.  Neurological: Positive for weakness (Right arm/shoulder, but not in her hand). Negative for numbness.       Objective:   Vitals:   01/13/18 1043  BP: 110/62  Pulse: 81  Resp: 16  Temp: 98.1 F (36.7 C)  SpO2: 97%   BP Readings from Last 3 Encounters:  01/13/18 110/62  11/15/17 132/86  10/15/17 (!) 144/72   Wt Readings from Last 3 Encounters:  01/13/18 252 lb 12.8 oz (114.7 kg)  11/15/17 253 lb (114.8 kg)  10/15/17 249 lb (112.9 kg)   Body mass index is 40.8 kg/m.   Physical Exam    A Right Shoulder exam was performed.   SWELLING: none  EFFUSION: no  WARMTH: no warmth  TENDERNESS: tenderness with palpation of the shoulder joint  ROM: dec ROM with pain in all directions  NEUROLOGICAL EXAM: normal sensation and strength  PULSES: normal      Assessment & Plan:    See Problem List for Assessment and Plan of chronic medical problems.

## 2018-01-13 NOTE — Assessment & Plan Note (Signed)
Getting monthly B12 injections-due today Given

## 2018-01-14 ENCOUNTER — Ambulatory Visit: Payer: Medicare Other | Admitting: Family Medicine

## 2018-01-14 NOTE — Progress Notes (Deleted)
Misty [name] - 57 y.o. female MRN [id number]  Date of birth: [birth year]  SUBJECTIVE:  Including CC & ROS.  No chief complaint on file.   Misty [name] is a 57 y.o. female that is  ***.  ***   Review of Systems  HISTORY: Past Medical, Surgical, Social, and Family History Reviewed & Updated per EMR.   Pertinent Historical Findings include:  Past Medical History:  Diagnosis Date  . Allergy   . Arthritis   . Back pain   . Colon cancer ([place])   . Depression   . Diarrhea   . GERD (gastroesophageal reflux disease)   . Hypertension   . Insomnia   . MS (multiple sclerosis) ([place])   . Other hammer toe (acquired) 03/31/2013  . Pneumonia    hx of  . Umbilical hernia     Past Surgical History:  Procedure Laterality Date  . ABDOMINAL HYSTERECTOMY    . ANTERIOR CERVICAL DECOMP/DISCECTOMY FUSION N/A 03/01/2014   Procedure: ANTERIOR CERVICAL DECOMPRESSION/DISCECTOMY FUSION 1 LEVEL;  Surgeon: Charlie Pitter, MD;  Location: [place];  Service: Neurosurgery;  Laterality: N/A;  ANTERIOR CERVICAL DECOMPRESSION/DISCECTOMY FUSION 1 LEVEL  . BUNIONECTOMY Right 10/14/2014   @[facility]   . CESAREAN SECTION    . COLON SURGERY    . COLONOSCOPY    . FOOT SURGERY Right   . GASTRIC BYPASS    . Hammer Toe Repair Right 10/14/2014   RT #2, @[facility]   . TENOTOMY Right 10/14/2014   RT #3, @[facility]     Allergies  Allergen Reactions  . Phenergan [Promethazine Hcl] Anxiety  . Promethazine Anxiety  . Trazodone And Nefazodone Rash    Family History  Problem Relation Age of Onset  . Stroke Mother   . Hypertension Mother   . Hyperlipidemia Mother   . Diabetes Mother   . Diabetes Brother   . Hypertension Brother   . Hypertension Brother   . Hypertension Brother   . Hypertension Brother   . Hypertension Brother   . Alcoholism Brother      Social History   Socioeconomic History  . Marital status: Single    Spouse name: Not on file  . Number of children: 2  . Years of education: Not on file  .  Highest education level: Not on file  Occupational History  . Not on file  Social Needs  . Financial resource strain: Not hard at all  . Food insecurity:    Worry: Never true    Inability: Never true  . Transportation needs:    Medical: No    Non-medical: No  Tobacco Use  . Smoking status: Current Some Day Smoker    Packs/day: 0.25    Years: 20.00    Pack years: 5.00    Types: Cigarettes  . Smokeless tobacco: Never Used  Substance and Sexual Activity  . Alcohol use: Yes    Alcohol/week: 0.0 standard drinks    Comment: 1/2 pint daily  . Drug use: No  . Sexual activity: Yes    Partners: Male  Lifestyle  . Physical activity:    Days per week: 0 days    Minutes per session: 0 min  . Stress: To some extent  Relationships  . Social connections:    Talks on phone: More than three times a week    Gets together: More than three times a week    Attends religious service: Not on file    Active member of club or organization: Not on file  Attends meetings of clubs or organizations: Not on file    Relationship status: Not on file  . Intimate partner violence:    Fear of current or ex partner: Not on file    Emotionally abused: Not on file    Physically abused: Not on file    Forced sexual activity: Not on file  Other Topics Concern  . Not on file  Social History Narrative  . Not on file     PHYSICAL EXAM:  VS: There were no vitals taken for this visit. Physical Exam Gen: NAD, alert, cooperative with exam, well-appearing ENT: normal lips, normal nasal mucosa,  Eye: normal EOM, normal conjunctiva and lids CV:  no edema, +2 pedal pulses   Resp: no accessory muscle use, non-labored,  GI: no masses or tenderness, no hernia  Skin: no rashes, no areas of induration  Neuro: normal tone, normal sensation to touch Psych:  normal insight, alert and oriented MSK:  ***      ASSESSMENT & PLAN:   No problem-specific Assessment & Plan notes found for this  encounter.   The above documentation has been reviewed and is accurate and complete. Clearance Coots, MD 01/14/2018, 8:23 AM>

## 2018-01-15 DIAGNOSIS — Z79891 Long term (current) use of opiate analgesic: Secondary | ICD-10-CM | POA: Diagnosis not present

## 2018-01-15 DIAGNOSIS — M545 Low back pain: Secondary | ICD-10-CM | POA: Diagnosis not present

## 2018-01-15 DIAGNOSIS — G894 Chronic pain syndrome: Secondary | ICD-10-CM | POA: Diagnosis not present

## 2018-01-16 ENCOUNTER — Ambulatory Visit
Admission: RE | Admit: 2018-01-16 | Discharge: 2018-01-16 | Disposition: A | Discharge status: Home / Self Care | Payer: Medicare Other | Source: Ambulatory Visit | Attending: Internal Medicine | Admitting: Internal Medicine

## 2018-01-16 DIAGNOSIS — Z1231 Encounter for screening mammogram for malignant neoplasm of breast: Secondary | ICD-10-CM

## 2018-01-16 NOTE — Progress Notes (Signed)
Misty [name] - 57 y.o. female MRN [id number]  Date of birth: 1960/09/23  SUBJECTIVE:  Including CC & ROS.  Chief Complaint  Patient presents with  . Follow-up    R shoulder pain    Misty [name] is a 57 y.o. female that is returning for a f/u visit for her R shoulder.  Pt states that she fell 2 weeks ago but does not recall actually injuring her shoulder when she fell.  She rates her pain as severe when she tries to move and describes it as an aching, pressure in her R shoulder that radiates into her R arm.  Pt reports some N/T into the R UE.  Aggravating factors: R shoulder ROM Alleviating factors: nothing noted  Taking Meloxiam and Oxycontin.  No imaging of R shoulder yet.  Misty [name], ATC, have acted as a Presenter, broadcasting today for Dr. Raeford Razor.   Review of Systems  Constitutional: Negative for fever.  HENT: Negative for congestion.   Respiratory: Negative for cough.   Cardiovascular: Negative for chest pain.  Gastrointestinal: Negative for abdominal pain.  Musculoskeletal: Negative for gait problem.  Skin: Negative for color change.  Neurological: Negative for weakness.  Hematological: Negative for adenopathy.  Psychiatric/Behavioral: Negative for agitation.    HISTORY: Past Medical, Surgical, Social, and Family History Reviewed & Updated per EMR.   Pertinent Historical Findings include:  Past Medical History:  Diagnosis Date  . Allergy   . Arthritis   . Back pain   . Colon cancer ([place])   . Depression   . Diarrhea   . GERD (gastroesophageal reflux disease)   . Hypertension   . Insomnia   . MS (multiple sclerosis) ([place])   . Other hammer toe (acquired) 03/31/2013  . Pneumonia    hx of  . Umbilical hernia     Past Surgical History:  Procedure Laterality Date  . ABDOMINAL HYSTERECTOMY    . ANTERIOR CERVICAL DECOMP/DISCECTOMY FUSION N/A 03/01/2014   Procedure: ANTERIOR CERVICAL DECOMPRESSION/DISCECTOMY FUSION 1 LEVEL;  Surgeon: Charlie Pitter, MD;   Location: [facility];  Service: Neurosurgery;  Laterality: N/A;  ANTERIOR CERVICAL DECOMPRESSION/DISCECTOMY FUSION 1 LEVEL  . BUNIONECTOMY Right 10/14/2014   @[facility]   . CESAREAN SECTION    . COLON SURGERY    . COLONOSCOPY    . FOOT SURGERY Right   . GASTRIC BYPASS    . Hammer Toe Repair Right 10/14/2014   RT #2, @[facility]   . TENOTOMY Right 10/14/2014   RT #3, @[facility]     Allergies  Allergen Reactions  . Phenergan [Promethazine Hcl] Anxiety  . Promethazine Anxiety  . Trazodone And Nefazodone Rash    Family History  Problem Relation Age of Onset  . Stroke Mother   . Hypertension Mother   . Hyperlipidemia Mother   . Diabetes Mother   . Diabetes Brother   . Hypertension Brother   . Hypertension Brother   . Hypertension Brother   . Hypertension Brother   . Hypertension Brother   . Alcoholism Brother      Social History   Socioeconomic History  . Marital status: Single    Spouse name: Not on file  . Number of children: 2  . Years of education: Not on file  . Highest education level: Not on file  Occupational History  . Not on file  Social Needs  . Financial resource strain: Not hard at all  . Food insecurity:    Worry: Never true    Inability: Never true  .  Transportation needs:    Medical: No    Non-medical: No  Tobacco Use  . Smoking status: Current Some Day Smoker    Packs/day: 0.25    Years: 20.00    Pack years: 5.00    Types: Cigarettes  . Smokeless tobacco: Never Used  Substance and Sexual Activity  . Alcohol use: Yes    Alcohol/week: 0.0 standard drinks    Comment: 1/2 pint daily  . Drug use: No  . Sexual activity: Yes    Partners: Male  Lifestyle  . Physical activity:    Days per week: 0 days    Minutes per session: 0 min  . Stress: To some extent  Relationships  . Social connections:    Talks on phone: More than three times a week    Gets together: More than three times a week    Attends religious service: Not on file    Active member of club or  organization: Not on file    Attends meetings of clubs or organizations: Not on file    Relationship status: Not on file  . Intimate partner violence:    Fear of current or ex partner: Not on file    Emotionally abused: Not on file    Physically abused: Not on file    Forced sexual activity: Not on file  Other Topics Concern  . Not on file  Social History Narrative  . Not on file     PHYSICAL EXAM:  VS: BP 112/70 (BP Location: Left Arm, Patient Position: Sitting, Cuff Size: Large)   Pulse 98   Ht 5\' 6"  (1.676 m)   Wt 250 lb 6.4 oz (113.6 kg)   SpO2 96%   BMI 40.42 kg/m  Physical Exam Gen: NAD, alert, cooperative with exam, well-appearing ENT: normal lips, normal nasal mucosa,  Eye: normal EOM, normal conjunctiva and lids CV:  no edema, +2 pedal pulses   Resp: no accessory muscle use, non-labored,  Skin: no rashes, no areas of induration  Neuro: normal tone, normal sensation to touch Psych:  normal insight, alert and oriented MSK:  Right shoulder: Limited active flexion and abduction to about 110 degrees. Limited external rotation. Pain with external rotation and abduction. Negative empty can testing. Normal strength resistance with internal and external rotation. Negative speeds testing. Neurovascular intact   Aspiration/Injection Procedure Note Misty [name] [birth year]  Procedure: Injection Indications: Right shoulder pain  Procedure Details Consent: Risks of procedure as well as the alternatives and risks of each were explained to the (patient/caregiver).  Consent for procedure obtained. Time Out: Verified patient identification, verified procedure, site/side was marked, verified correct patient position, special equipment/implants available, medications/allergies/relevent history reviewed, required imaging and test results available.  Performed.  The area was cleaned with iodine and alcohol swabs.    The right glenohumeral joint was injected using 1 cc's of  40 mg Kenalog and 7 cc's of 0.5% bupivacaine with a 22 3 1/2" needle.  Ultrasound was used. Images were obtained in Transverse views showing the injection.    A sterile dressing was applied.  Patient did tolerate procedure well.       ASSESSMENT & PLAN:   Adhesive capsulitis of right shoulder Symptoms suggestive of frozen shoulder. Not suggestive of rotator cuff.  -Intra-articular injection -Counseled on home exercise therapy and supportive care. -X-ray -If no improvement consider physical therapy.   The above documentation has been reviewed and is accurate and complete. Clearance Coots, MD 01/20/2018, 4:04 PM>

## 2018-01-17 ENCOUNTER — Ambulatory Visit (INDEPENDENT_AMBULATORY_CARE_PROVIDER_SITE_OTHER)
Admission: RE | Admit: 2018-01-17 | Discharge: 2018-01-17 | Disposition: A | Discharge status: Home / Self Care | Payer: Medicare Other | Source: Ambulatory Visit | Attending: Family Medicine | Admitting: Family Medicine

## 2018-01-17 ENCOUNTER — Ambulatory Visit (INDEPENDENT_AMBULATORY_CARE_PROVIDER_SITE_OTHER): Payer: Medicare Other | Admitting: Family Medicine

## 2018-01-17 ENCOUNTER — Encounter: Payer: Self-pay | Admitting: Family Medicine

## 2018-01-17 ENCOUNTER — Ambulatory Visit: Payer: Self-pay

## 2018-01-17 ENCOUNTER — Other Ambulatory Visit: Payer: Medicare Other

## 2018-01-17 VITALS — BP 112/70 | HR 98 | Ht 66.0 in | Wt 250.4 lb

## 2018-01-17 DIAGNOSIS — M7501 Adhesive capsulitis of right shoulder: Secondary | ICD-10-CM | POA: Diagnosis not present

## 2018-01-17 DIAGNOSIS — M25511 Pain in right shoulder: Secondary | ICD-10-CM

## 2018-01-17 NOTE — Patient Instructions (Signed)
Good to see you  Please try the exercises  Please see me back in 4-6 weeks if the pain isn't improving.  I will call you with the results from today.

## 2018-01-20 ENCOUNTER — Telehealth: Payer: Self-pay | Admitting: Family Medicine

## 2018-01-20 DIAGNOSIS — M7501 Adhesive capsulitis of right shoulder: Secondary | ICD-10-CM | POA: Insufficient documentation

## 2018-01-20 NOTE — Assessment & Plan Note (Signed)
Symptoms suggestive of frozen shoulder. Not suggestive of rotator cuff.  -Intra-articular injection -Counseled on home exercise therapy and supportive care. -X-ray -If no improvement consider physical therapy.

## 2018-01-20 NOTE — Telephone Encounter (Signed)
Informed of xray results.   Rosemarie Ax, MD [facility] 01/20/2018, 4:06 PM

## 2018-01-27 NOTE — Progress Notes (Signed)
Subjective:    Patient ID: Misty [name], female    DOB: 1960-09-20, 57 y.o.   MRN: [id number]  HPI The patient is here for an acute visit.   Sore on head:   It has been there over one month.  She is wearing a hair piece and it somehow damaged the back of her head-she states it felt like a burn initially.  She removed the hair piece and had a sore there.  She now has a very hard scar there that is very irritating.  She is applied several things, but it is not coming off.  It does itch, hurts at times, especially when she leans her head back.  She is unsure what to put on it.  There is no discharge.  She has not had any fevers or chills.  Medications and allergies reviewed with patient and updated if appropriate.  Patient Active Problem List   Diagnosis Date Noted  . Adhesive capsulitis of right shoulder 01/20/2018  . Acute pain of right shoulder 11/15/2017  . Ventral hernia 09/16/2017  . Acute right flank pain 09/16/2017  . Abdominal pain 09/16/2017  . Greater trochanteric bursitis of right hip 04/30/2017  . Groin pain, right 04/17/2017  . Prediabetes 04/16/2017  . Arm pain, left 01/29/2017  . Numbness 01/29/2017  . Chronic midline low back pain 07/20/2016  . Sacral mass 07/20/2016  . Osteoarthritis of right hip 07/20/2016  . Spinal stenosis of lumbar region 11/29/2015  . Positive urine drug screen 11/16/2015  . Chronic pain syndrome 10/12/2015  . Umbilical hernia 02/72/5366  . Sleep difficulties 07/15/2015  . Bunion, right 06/20/2015  . Callus of foot 04/27/2015  . Muscle spasm 04/16/2015  . B12 deficiency 03/30/2015  . Hammer toe of left foot 02/16/2015  . Fibrosis of skin of lower extremity 02/16/2015  . Status post right foot surgery 10/26/2014  . Cervical spondylosis with radiculopathy 03/01/2014  . Hammer toe of right foot 01/26/2014  . Benign intracranial hypertension 06/18/2013  . Pain in lower limb 05/22/2013  . Abnormal gait 04/21/2013  . Arthritis  04/21/2013  . Disturbance of skin sensation 04/21/2013  . Cephalalgia 04/21/2013  . Porokeratosis 03/31/2013  . Vitamin D deficiency 04/18/2010  . Fatigue 04/14/2010  . Back pain with right-sided radiculopathy 11/29/2008  . MEDIAL MENISCUS TEAR, LEFT 06/30/2008  . BARRETTS ESOPHAGUS 06/28/2008  . OSTEOARTHRITIS, KNEES, BILATERAL 06/28/2008  . OBESITY 05/24/2008  . HYPERTENSION, BENIGN ESSENTIAL 05/24/2008  . IRRITABLE BOWEL SYNDROME 05/24/2008  . BARIATRIC SURGERY STATUS 03/19/2006  . NEOPLASM, MALIGNANT, COLON, HX OF 03/19/1997    Current Outpatient Medications on File Prior to Visit  Medication Sig Dispense Refill  . Cyanocobalamin (VITAMIN B-12 IJ) Inject as directed.    . gabapentin (NEURONTIN) 300 MG capsule One po qAM, two po q PM and 2 po q HS 150 capsule 5  . hydrochlorothiazide (MICROZIDE) 12.5 MG capsule Take 1 capsule (12.5 mg total) by mouth daily. 90 capsule 3  . ibuprofen (ADVIL,MOTRIN) 200 MG tablet Take 200 mg by mouth every 6 (six) hours as needed.    Marland Kitchen lisinopril (PRINIVIL,ZESTRIL) 20 MG tablet     . meloxicam (MOBIC) 15 MG tablet TAKE 1 TABLET BY MOUTH ONCE DAILY WITH FOOD  2  . naproxen sodium (ANAPROX) 220 MG tablet Take 220 mg by mouth 2 (two) times daily with a meal.    . NARCAN 4 MG/0.1ML LIQD nasal spray kit     . Oxycodone HCl 10 MG TABS     .  SSD 1 % cream     . VENTOLIN HFA 108 (90 Base) MCG/ACT inhaler     . XTAMPZA ER 9 MG C12A     . diclofenac sodium (VOLTAREN) 1 % GEL APPLY 2 GRAMS TOPICALLY TO AFFECTED AREAS 4 TIMES DAILY  0   No current facility-administered medications on file prior to visit.     Past Medical History:  Diagnosis Date  . Allergy   . Arthritis   . Back pain   . Colon cancer ([place])   . Depression   . Diarrhea   . GERD (gastroesophageal reflux disease)   . Hypertension   . Insomnia   . MS (multiple sclerosis) ([place])   . Other hammer toe (acquired) 03/31/2013  . Pneumonia    hx of  . Umbilical hernia     Past Surgical  History:  Procedure Laterality Date  . ABDOMINAL HYSTERECTOMY    . ANTERIOR CERVICAL DECOMP/DISCECTOMY FUSION N/A 03/01/2014   Procedure: ANTERIOR CERVICAL DECOMPRESSION/DISCECTOMY FUSION 1 LEVEL;  Surgeon: Charlie Pitter, MD;  Location: [place] [facility];  Service: Neurosurgery;  Laterality: N/A;  ANTERIOR CERVICAL DECOMPRESSION/DISCECTOMY FUSION 1 LEVEL  . BUNIONECTOMY Right 10/14/2014   @[facility]   . CESAREAN SECTION    . COLON SURGERY    . COLONOSCOPY    . FOOT SURGERY Right   . GASTRIC BYPASS    . Hammer Toe Repair Right 10/14/2014   RT #2, @[facility]   . TENOTOMY Right 10/14/2014   RT #3, @[facility]     Social History   Socioeconomic History  . Marital status: Single    Spouse name: Not on file  . Number of children: 2  . Years of education: Not on file  . Highest education level: Not on file  Occupational History  . Not on file  Social Needs  . Financial resource strain: Not hard at all  . Food insecurity:    Worry: Never true    Inability: Never true  . Transportation needs:    Medical: No    Non-medical: No  Tobacco Use  . Smoking status: Current Some Day Smoker    Packs/day: 0.25    Years: 20.00    Pack years: 5.00    Types: Cigarettes  . Smokeless tobacco: Never Used  Substance and Sexual Activity  . Alcohol use: Yes    Alcohol/week: 0.0 standard drinks    Comment: 1/2 pint daily  . Drug use: No  . Sexual activity: Yes    Partners: Male  Lifestyle  . Physical activity:    Days per week: 0 days    Minutes per session: 0 min  . Stress: To some extent  Relationships  . Social connections:    Talks on phone: More than three times a week    Gets together: More than three times a week    Attends religious service: Not on file    Active member of club or organization: Not on file    Attends meetings of clubs or organizations: Not on file    Relationship status: Not on file  Other Topics Concern  . Not on file  Social History Narrative  . Not on file    Family History    Problem Relation Age of Onset  . Stroke Mother   . Hypertension Mother   . Hyperlipidemia Mother   . Diabetes Mother   . Diabetes Brother   . Hypertension Brother   . Hypertension Brother   . Hypertension Brother   . Hypertension Brother   .  Hypertension Brother   . Alcoholism Brother     Review of Systems  Constitutional: Negative for chills and fever.  Skin: Positive for color change and wound.  Neurological: Negative for light-headedness and headaches.       Objective:   Vitals:   01/28/18 1502  BP: 112/64  Pulse: 97  Resp: 16  Temp: 98.3 F (36.8 C)  SpO2: 98%   BP Readings from Last 3 Encounters:  01/28/18 112/64  01/17/18 112/70  01/13/18 110/62   Wt Readings from Last 3 Encounters:  01/28/18 243 lb 6.4 oz (110.4 kg)  01/17/18 250 lb 6.4 oz (113.6 kg)  01/13/18 252 lb 12.8 oz (114.7 kg)   Body mass index is 39.29 kg/m.   Physical Exam  Constitutional: She appears well-developed and well-nourished. No distress.  Skin: Skin is warm and dry. She is not diaphoretic.  Eschar the size of a grape posterior lower head that is mildly tender.  There are no open wound/ulcer.  No discharge.  Minimal surrounding swelling, minimal erythema surrounding           Assessment & Plan:    See Problem List for Assessment and Plan of chronic medical problems.

## 2018-01-28 ENCOUNTER — Encounter: Payer: Self-pay | Admitting: Internal Medicine

## 2018-01-28 ENCOUNTER — Ambulatory Visit (INDEPENDENT_AMBULATORY_CARE_PROVIDER_SITE_OTHER): Payer: Medicare Other | Admitting: Internal Medicine

## 2018-01-28 VITALS — BP 112/64 | HR 97 | Temp 98.3°F | Resp 16 | Ht 66.0 in | Wt 243.4 lb

## 2018-01-28 DIAGNOSIS — R234 Changes in skin texture: Secondary | ICD-10-CM

## 2018-01-28 NOTE — Assessment & Plan Note (Signed)
Eschar the size of a grape posterior head-wound present for approximately 1 month She does have some discomfort and irritation Likely needs to be debrided Referral to dermatology

## 2018-01-28 NOTE — Patient Instructions (Addendum)
A referral was ordered for dermatology.  They will call you to schedule an appointment.

## 2018-01-31 ENCOUNTER — Telehealth: Payer: Self-pay

## 2018-01-31 NOTE — Telephone Encounter (Signed)
-----   Message from [name], MD sent at 01/28/2018  9:08 PM EST ----- Can you please just call her and let her know that surgery will not look at her wound on her head-it has to be dermatology.  Thank you

## 2018-01-31 NOTE — Telephone Encounter (Signed)
Pt aware.

## 2018-02-11 ENCOUNTER — Other Ambulatory Visit: Payer: Self-pay | Admitting: Neurology

## 2018-02-11 DIAGNOSIS — R1084 Generalized abdominal pain: Secondary | ICD-10-CM | POA: Diagnosis not present

## 2018-02-11 DIAGNOSIS — R110 Nausea: Secondary | ICD-10-CM | POA: Diagnosis not present

## 2018-02-12 DIAGNOSIS — G894 Chronic pain syndrome: Secondary | ICD-10-CM | POA: Diagnosis not present

## 2018-02-12 DIAGNOSIS — M545 Low back pain: Secondary | ICD-10-CM | POA: Diagnosis not present

## 2018-02-12 DIAGNOSIS — Z79891 Long term (current) use of opiate analgesic: Secondary | ICD-10-CM | POA: Diagnosis not present

## 2018-02-17 DIAGNOSIS — R1084 Generalized abdominal pain: Secondary | ICD-10-CM | POA: Diagnosis not present

## 2018-02-22 ENCOUNTER — Emergency Department (HOSPITAL_COMMUNITY): Payer: Medicare Other

## 2018-02-22 ENCOUNTER — Inpatient Hospital Stay (HOSPITAL_COMMUNITY)
Admission: EM | Admit: 2018-02-22 | Discharge: 2018-02-26 | DRG: 439 | Disposition: A | Discharge status: Home Health | Payer: Medicare Other | Attending: Internal Medicine | Admitting: Internal Medicine

## 2018-02-22 ENCOUNTER — Encounter (HOSPITAL_COMMUNITY): Payer: Self-pay

## 2018-02-22 ENCOUNTER — Inpatient Hospital Stay (HOSPITAL_COMMUNITY): Payer: Medicare Other

## 2018-02-22 DIAGNOSIS — F1020 Alcohol dependence, uncomplicated: Secondary | ICD-10-CM | POA: Diagnosis present

## 2018-02-22 DIAGNOSIS — R7881 Bacteremia: Secondary | ICD-10-CM | POA: Diagnosis present

## 2018-02-22 DIAGNOSIS — R000 Tachycardia, unspecified: Secondary | ICD-10-CM | POA: Diagnosis not present

## 2018-02-22 DIAGNOSIS — K589 Irritable bowel syndrome without diarrhea: Secondary | ICD-10-CM | POA: Diagnosis present

## 2018-02-22 DIAGNOSIS — Z791 Long term (current) use of non-steroidal anti-inflammatories (NSAID): Secondary | ICD-10-CM

## 2018-02-22 DIAGNOSIS — Z79899 Other long term (current) drug therapy: Secondary | ICD-10-CM

## 2018-02-22 DIAGNOSIS — R1011 Right upper quadrant pain: Secondary | ICD-10-CM

## 2018-02-22 DIAGNOSIS — B373 Candidiasis of vulva and vagina: Secondary | ICD-10-CM | POA: Diagnosis present

## 2018-02-22 DIAGNOSIS — K859 Acute pancreatitis without necrosis or infection, unspecified: Secondary | ICD-10-CM | POA: Diagnosis present

## 2018-02-22 DIAGNOSIS — Z9884 Bariatric surgery status: Secondary | ICD-10-CM | POA: Diagnosis not present

## 2018-02-22 DIAGNOSIS — Z85038 Personal history of other malignant neoplasm of large intestine: Secondary | ICD-10-CM

## 2018-02-22 DIAGNOSIS — B9620 Unspecified Escherichia coli [E. coli] as the cause of diseases classified elsewhere: Secondary | ICD-10-CM | POA: Diagnosis present

## 2018-02-22 DIAGNOSIS — K8020 Calculus of gallbladder without cholecystitis without obstruction: Secondary | ICD-10-CM | POA: Diagnosis present

## 2018-02-22 DIAGNOSIS — G35 Multiple sclerosis: Secondary | ICD-10-CM | POA: Diagnosis present

## 2018-02-22 DIAGNOSIS — G894 Chronic pain syndrome: Secondary | ICD-10-CM | POA: Diagnosis present

## 2018-02-22 DIAGNOSIS — R7989 Other specified abnormal findings of blood chemistry: Secondary | ICD-10-CM

## 2018-02-22 DIAGNOSIS — F17210 Nicotine dependence, cigarettes, uncomplicated: Secondary | ICD-10-CM | POA: Diagnosis present

## 2018-02-22 DIAGNOSIS — Z6841 Body Mass Index (BMI) 40.0 and over, adult: Secondary | ICD-10-CM | POA: Diagnosis not present

## 2018-02-22 DIAGNOSIS — Z9049 Acquired absence of other specified parts of digestive tract: Secondary | ICD-10-CM | POA: Diagnosis not present

## 2018-02-22 DIAGNOSIS — Z90710 Acquired absence of both cervix and uterus: Secondary | ICD-10-CM

## 2018-02-22 DIAGNOSIS — R945 Abnormal results of liver function studies: Secondary | ICD-10-CM | POA: Diagnosis not present

## 2018-02-22 DIAGNOSIS — F1010 Alcohol abuse, uncomplicated: Secondary | ICD-10-CM | POA: Diagnosis present

## 2018-02-22 DIAGNOSIS — G479 Sleep disorder, unspecified: Secondary | ICD-10-CM | POA: Diagnosis present

## 2018-02-22 DIAGNOSIS — E669 Obesity, unspecified: Secondary | ICD-10-CM | POA: Diagnosis present

## 2018-02-22 DIAGNOSIS — D649 Anemia, unspecified: Secondary | ICD-10-CM | POA: Diagnosis present

## 2018-02-22 DIAGNOSIS — Z79891 Long term (current) use of opiate analgesic: Secondary | ICD-10-CM

## 2018-02-22 DIAGNOSIS — I10 Essential (primary) hypertension: Secondary | ICD-10-CM | POA: Diagnosis present

## 2018-02-22 DIAGNOSIS — K852 Alcohol induced acute pancreatitis without necrosis or infection: Secondary | ICD-10-CM | POA: Diagnosis not present

## 2018-02-22 DIAGNOSIS — Z789 Other specified health status: Secondary | ICD-10-CM | POA: Diagnosis not present

## 2018-02-22 DIAGNOSIS — K219 Gastro-esophageal reflux disease without esophagitis: Secondary | ICD-10-CM | POA: Diagnosis present

## 2018-02-22 DIAGNOSIS — R932 Abnormal findings on diagnostic imaging of liver and biliary tract: Secondary | ICD-10-CM | POA: Diagnosis not present

## 2018-02-22 LAB — CBC
HCT: 37.5 % (ref 36.0–46.0)
Hemoglobin: 11.3 g/dL — ABNORMAL LOW (ref 12.0–15.0)
MCH: 30.9 pg (ref 26.0–34.0)
MCHC: 30.1 g/dL (ref 30.0–36.0)
MCV: 102.5 fL — ABNORMAL HIGH (ref 80.0–100.0)
Platelets: 137 10*3/uL — ABNORMAL LOW (ref 150–400)
RBC: 3.66 MIL/uL — ABNORMAL LOW (ref 3.87–5.11)
RDW: 13.6 % (ref 11.5–15.5)
WBC: 8.2 10*3/uL (ref 4.0–10.5)
nRBC: 0 % (ref 0.0–0.2)

## 2018-02-22 LAB — COMPREHENSIVE METABOLIC PANEL
ALT: 153 U/L — ABNORMAL HIGH (ref 0–44)
AST: 573 U/L — ABNORMAL HIGH (ref 15–41)
Albumin: 3.4 g/dL — ABNORMAL LOW (ref 3.5–5.0)
Alkaline Phosphatase: 152 U/L — ABNORMAL HIGH (ref 38–126)
Anion gap: 10 (ref 5–15)
BUN: 23 mg/dL — ABNORMAL HIGH (ref 6–20)
CO2: 22 mmol/L (ref 22–32)
Calcium: 9.5 mg/dL (ref 8.9–10.3)
Chloride: 107 mmol/L (ref 98–111)
Creatinine, Ser: 1.3 mg/dL — ABNORMAL HIGH (ref 0.44–1.00)
GFR calc Af Amer: 53 mL/min — ABNORMAL LOW (ref >60)
GFR calc non Af Amer: 46 mL/min — ABNORMAL LOW (ref >60)
Glucose, Bld: 126 mg/dL — ABNORMAL HIGH (ref 70–99)
Potassium: 4.8 mmol/L (ref 3.5–5.1)
Sodium: 139 mmol/L (ref 135–145)
Total Bilirubin: 2.6 mg/dL — ABNORMAL HIGH (ref 0.3–1.2)
Total Protein: 6.9 g/dL (ref 6.5–8.1)

## 2018-02-22 LAB — CBC WITH DIFFERENTIAL/PLATELET
Abs Immature Granulocytes: 0.06 10*3/uL (ref 0.00–0.07)
Basophils Absolute: 0 10*3/uL (ref 0.0–0.1)
Basophils Relative: 0 %
Eosinophils Absolute: 0 10*3/uL (ref 0.0–0.5)
Eosinophils Relative: 0 %
HCT: 38 % (ref 36.0–46.0)
Hemoglobin: 11.4 g/dL — ABNORMAL LOW (ref 12.0–15.0)
Immature Granulocytes: 1 %
Lymphocytes Relative: 2 %
Lymphs Abs: 0.2 10*3/uL — ABNORMAL LOW (ref 0.7–4.0)
MCH: 30.6 pg (ref 26.0–34.0)
MCHC: 30 g/dL (ref 30.0–36.0)
MCV: 102.2 fL — ABNORMAL HIGH (ref 80.0–100.0)
Monocytes Absolute: 0.5 10*3/uL (ref 0.1–1.0)
Monocytes Relative: 5 %
Neutro Abs: 8.3 10*3/uL — ABNORMAL HIGH (ref 1.7–7.7)
Neutrophils Relative %: 92 %
Platelets: 177 10*3/uL (ref 150–400)
RBC: 3.72 MIL/uL — ABNORMAL LOW (ref 3.87–5.11)
RDW: 13.5 % (ref 11.5–15.5)
WBC: 9.1 10*3/uL (ref 4.0–10.5)
nRBC: 0 % (ref 0.0–0.2)

## 2018-02-22 LAB — CREATININE, SERUM
Creatinine, Ser: 1.2 mg/dL — ABNORMAL HIGH (ref 0.44–1.00)
GFR calc Af Amer: 59 mL/min — ABNORMAL LOW (ref >60)
GFR calc non Af Amer: 50 mL/min — ABNORMAL LOW (ref >60)

## 2018-02-22 LAB — URINALYSIS, ROUTINE W REFLEX MICROSCOPIC
Bilirubin Urine: NEGATIVE
Glucose, UA: NEGATIVE mg/dL
Hgb urine dipstick: NEGATIVE
Ketones, ur: NEGATIVE mg/dL
Leukocytes, UA: NEGATIVE
Nitrite: NEGATIVE
Protein, ur: NEGATIVE mg/dL
Specific Gravity, Urine: 1.015 (ref 1.005–1.030)
pH: 5 (ref 5.0–8.0)

## 2018-02-22 LAB — GLUCOSE, CAPILLARY: Glucose-Capillary: 97 mg/dL (ref 70–99)

## 2018-02-22 LAB — HEMOGLOBIN A1C
Hgb A1c MFr Bld: 5.1 % (ref 4.8–5.6)
Mean Plasma Glucose: 99.67 mg/dL

## 2018-02-22 LAB — I-STAT CG4 LACTIC ACID, ED
Lactic Acid, Venous: 1.87 mmol/L (ref 0.5–1.9)
Lactic Acid, Venous: 1.39 mmol/L (ref 0.5–1.9)

## 2018-02-22 LAB — LIPASE, BLOOD: Lipase: 739 U/L — ABNORMAL HIGH (ref 11–51)

## 2018-02-22 MED ORDER — HYDROMORPHONE HCL 1 MG/ML IJ SOLN
1.0000 mg | INTRAMUSCULAR | Status: DC | PRN
  Administered 2018-02-22 – 2018-02-26 (×6): 1 mg via INTRAVENOUS
  Filled 2018-02-22 (×6): qty 1

## 2018-02-22 MED ORDER — ACETAMINOPHEN 650 MG RE SUPP: 650.0000 mg | Freq: Four times a day (QID) | RECTAL | Status: DC | PRN

## 2018-02-22 MED ORDER — GABAPENTIN 300 MG PO CAPS: 300.0000 mg | ORAL_CAPSULE | ORAL | Status: DC

## 2018-02-22 MED ORDER — ENOXAPARIN SODIUM 40 MG/0.4ML ~~LOC~~ SOLN
40.0000 mg | SUBCUTANEOUS | Status: DC
  Administered 2018-02-22 – 2018-02-24 (×3): 40 mg via SUBCUTANEOUS
  Filled 2018-02-22 (×3): qty 0.4

## 2018-02-22 MED ORDER — OXYCODONE HCL 5 MG PO TABS
10.0000 mg | ORAL_TABLET | Freq: Every day | ORAL | Status: DC
  Administered 2018-02-22 – 2018-02-26 (×14): 10 mg via ORAL
  Filled 2018-02-22 (×13): qty 2

## 2018-02-22 MED ORDER — ONDANSETRON HCL 4 MG PO TABS: 4.0000 mg | ORAL_TABLET | Freq: Four times a day (QID) | ORAL | Status: DC | PRN

## 2018-02-22 MED ORDER — LORAZEPAM 2 MG/ML IJ SOLN: 0.0000 mg | Freq: Two times a day (BID) | INTRAMUSCULAR | Status: DC

## 2018-02-22 MED ORDER — ACETAMINOPHEN 325 MG PO TABS
650.0000 mg | ORAL_TABLET | Freq: Once | ORAL | Status: AC
  Administered 2018-02-22: 650 mg via ORAL
  Filled 2018-02-22: qty 2

## 2018-02-22 MED ORDER — SODIUM CHLORIDE 0.9 % IV BOLUS
1000.0000 mL | Freq: Once | INTRAVENOUS | Status: AC
  Administered 2018-02-22: 1000 mL via INTRAVENOUS

## 2018-02-22 MED ORDER — ONDANSETRON HCL 4 MG/2ML IJ SOLN
4.0000 mg | Freq: Four times a day (QID) | INTRAMUSCULAR | Status: DC | PRN
  Filled 2018-02-22: qty 2

## 2018-02-22 MED ORDER — MORPHINE SULFATE (PF) 4 MG/ML IV SOLN
4.0000 mg | Freq: Once | INTRAVENOUS | Status: AC
  Administered 2018-02-22: 4 mg via INTRAVENOUS
  Filled 2018-02-22: qty 1

## 2018-02-22 MED ORDER — HYDROMORPHONE HCL 1 MG/ML IJ SOLN: 1.0000 mg | Freq: Once | INTRAMUSCULAR | Status: DC

## 2018-02-22 MED ORDER — ADULT MULTIVITAMIN W/MINERALS CH
1.0000 | ORAL_TABLET | Freq: Every day | ORAL | Status: DC
  Administered 2018-02-24 – 2018-02-26 (×3): 1 via ORAL
  Filled 2018-02-22 (×4): qty 1

## 2018-02-22 MED ORDER — ZOLPIDEM TARTRATE 5 MG PO TABS
5.0000 mg | ORAL_TABLET | Freq: Every evening | ORAL | Status: DC | PRN
  Administered 2018-02-24: 5 mg via ORAL
  Filled 2018-02-22: qty 1

## 2018-02-22 MED ORDER — METRONIDAZOLE IN NACL 5-0.79 MG/ML-% IV SOLN
500.0000 mg | Freq: Three times a day (TID) | INTRAVENOUS | Status: DC
  Administered 2018-02-22 – 2018-02-25 (×8): 500 mg via INTRAVENOUS
  Filled 2018-02-22 (×8): qty 100

## 2018-02-22 MED ORDER — ACETAMINOPHEN 325 MG PO TABS
650.0000 mg | ORAL_TABLET | Freq: Four times a day (QID) | ORAL | Status: DC | PRN
  Administered 2018-02-23: 650 mg via ORAL
  Filled 2018-02-22: qty 2

## 2018-02-22 MED ORDER — GABAPENTIN 300 MG PO CAPS
600.0000 mg | ORAL_CAPSULE | Freq: Every evening | ORAL | Status: DC
  Administered 2018-02-23 – 2018-02-25 (×4): 600 mg via ORAL
  Filled 2018-02-22 (×3): qty 2

## 2018-02-22 MED ORDER — OXYCODONE ER 9 MG PO C12A: 9.0000 mg | EXTENDED_RELEASE_CAPSULE | Freq: Two times a day (BID) | ORAL | Status: DC

## 2018-02-22 MED ORDER — SODIUM CHLORIDE 0.9% FLUSH: 3.0000 mL | INTRAVENOUS | Status: DC | PRN

## 2018-02-22 MED ORDER — FOLIC ACID 1 MG PO TABS
1.0000 mg | ORAL_TABLET | Freq: Every day | ORAL | Status: DC
  Administered 2018-02-23 – 2018-02-26 (×4): 1 mg via ORAL
  Filled 2018-02-22 (×4): qty 1

## 2018-02-22 MED ORDER — THIAMINE HCL 100 MG/ML IJ SOLN
100.0000 mg | Freq: Every day | INTRAMUSCULAR | Status: DC
  Filled 2018-02-22: qty 2

## 2018-02-22 MED ORDER — SODIUM CHLORIDE 0.9% FLUSH
3.0000 mL | Freq: Two times a day (BID) | INTRAVENOUS | Status: DC
  Administered 2018-02-24 – 2018-02-26 (×3): 3 mL via INTRAVENOUS

## 2018-02-22 MED ORDER — LORAZEPAM 2 MG/ML IJ SOLN: 1.0000 mg | Freq: Four times a day (QID) | INTRAMUSCULAR | Status: AC | PRN

## 2018-02-22 MED ORDER — SODIUM CHLORIDE 0.9 % IV BOLUS
800.0000 mL | Freq: Once | INTRAVENOUS | Status: AC
  Administered 2018-02-22: 800 mL via INTRAVENOUS

## 2018-02-22 MED ORDER — GABAPENTIN 300 MG PO CAPS
600.0000 mg | ORAL_CAPSULE | Freq: Every day | ORAL | Status: DC
  Administered 2018-02-22 – 2018-02-24 (×2): 600 mg via ORAL
  Filled 2018-02-22 (×4): qty 2

## 2018-02-22 MED ORDER — VITAMIN B-1 100 MG PO TABS
100.0000 mg | ORAL_TABLET | Freq: Every day | ORAL | Status: DC
  Administered 2018-02-23 – 2018-02-26 (×4): 100 mg via ORAL
  Filled 2018-02-22 (×4): qty 1

## 2018-02-22 MED ORDER — INSULIN ASPART 100 UNIT/ML ~~LOC~~ SOLN: 0.0000 [IU] | Freq: Every day | SUBCUTANEOUS | Status: DC

## 2018-02-22 MED ORDER — SODIUM CHLORIDE 0.9 % IV SOLN
250.0000 mL | INTRAVENOUS | Status: DC | PRN
  Administered 2018-02-23 – 2018-02-25 (×3): 250 mL via INTRAVENOUS

## 2018-02-22 MED ORDER — GABAPENTIN 300 MG PO CAPS
300.0000 mg | ORAL_CAPSULE | ORAL | Status: DC
  Administered 2018-02-23 – 2018-02-26 (×3): 300 mg via ORAL
  Filled 2018-02-22 (×3): qty 1

## 2018-02-22 MED ORDER — SODIUM CHLORIDE 0.9% FLUSH
3.0000 mL | Freq: Two times a day (BID) | INTRAVENOUS | Status: DC
  Administered 2018-02-22 – 2018-02-25 (×4): 3 mL via INTRAVENOUS

## 2018-02-22 MED ORDER — LORAZEPAM 1 MG PO TABS: 1.0000 mg | ORAL_TABLET | Freq: Four times a day (QID) | ORAL | Status: AC | PRN

## 2018-02-22 MED ORDER — LISINOPRIL 20 MG PO TABS
20.0000 mg | ORAL_TABLET | Freq: Every day | ORAL | Status: DC
  Administered 2018-02-23 – 2018-02-26 (×4): 20 mg via ORAL
  Filled 2018-02-22 (×4): qty 1

## 2018-02-22 MED ORDER — ALBUTEROL SULFATE (2.5 MG/3ML) 0.083% IN NEBU: 3.0000 mL | INHALATION_SOLUTION | Freq: Four times a day (QID) | RESPIRATORY_TRACT | Status: DC | PRN

## 2018-02-22 MED ORDER — HYDROMORPHONE HCL 1 MG/ML IJ SOLN: 0.5000 mg | INTRAMUSCULAR | Status: DC | PRN

## 2018-02-22 MED ORDER — OXYCODONE HCL ER 10 MG PO T12A
10.0000 mg | EXTENDED_RELEASE_TABLET | Freq: Two times a day (BID) | ORAL | Status: DC
  Administered 2018-02-22 – 2018-02-26 (×6): 10 mg via ORAL
  Filled 2018-02-22 (×6): qty 1

## 2018-02-22 MED ORDER — OXYCODONE HCL 5 MG PO TABS
5.0000 mg | ORAL_TABLET | ORAL | Status: DC | PRN
  Administered 2018-02-25: 5 mg via ORAL
  Filled 2018-02-22: qty 1

## 2018-02-22 MED ORDER — IOHEXOL 300 MG/ML  SOLN
100.0000 mL | Freq: Once | INTRAMUSCULAR | Status: AC | PRN
  Administered 2018-02-22: 100 mL via INTRAVENOUS

## 2018-02-22 MED ORDER — HYDROCHLOROTHIAZIDE 12.5 MG PO CAPS
12.5000 mg | ORAL_CAPSULE | Freq: Every day | ORAL | Status: DC
  Administered 2018-02-23 – 2018-02-26 (×4): 12.5 mg via ORAL
  Filled 2018-02-22 (×4): qty 1

## 2018-02-22 MED ORDER — LORAZEPAM 2 MG/ML IJ SOLN: 0.0000 mg | Freq: Four times a day (QID) | INTRAMUSCULAR | Status: AC

## 2018-02-22 MED ORDER — SODIUM CHLORIDE 0.9 % IV SOLN
2.0000 g | INTRAVENOUS | Status: DC
  Administered 2018-02-22 – 2018-02-25 (×4): 2 g via INTRAVENOUS
  Filled 2018-02-22 (×5): qty 20

## 2018-02-22 MED ORDER — INSULIN ASPART 100 UNIT/ML ~~LOC~~ SOLN: 0.0000 [IU] | Freq: Three times a day (TID) | SUBCUTANEOUS | Status: DC

## 2018-02-22 MED ORDER — POTASSIUM CHLORIDE IN NACL 20-0.9 MEQ/L-% IV SOLN
INTRAVENOUS | Status: AC
  Administered 2018-02-22 – 2018-02-23 (×2): via INTRAVENOUS
  Filled 2018-02-22 (×2): qty 1000

## 2018-02-22 MED ORDER — GUAIFENESIN-DM 100-10 MG/5ML PO SYRP
5.0000 mL | ORAL_SOLUTION | ORAL | Status: DC | PRN
  Administered 2018-02-22: 5 mL via ORAL
  Filled 2018-02-22: qty 5

## 2018-02-22 NOTE — Consult Note (Signed)
[facility] CONSULT Reason for consult: Pancreatitis Referring Physician: [facility] hospitalist.  PCP: Dr. Billey Gosling.  Primary GI: Dr. Wonda Horner  Misty [name] is an 57 y.o. female.  HPI: She is an established patient of Dr. Erlinda Hong has been worked up in the past with diarrhea.  She had a right hemicolectomy in 1999 for adenocarcinoma of the cecum and is gotten subsequent colonoscopy since.  She previously saw Dr. Lajoyce Corners.  She also has had a gastric bypass procedure.  She has had chronic upper abdominal pain is attributed this to an abdominal wall hernia following her gastric bypass surgery and apparently she had to have mesh placement.  There was a question of Barrett's esophagus in the past but this was not clearly seen on subsequent EGDs.  She also carries a diagnosis of IBS.  She has a history of heavy drinking in the past had done fairly well until 2022/12/17 and her son died unexpectedly in 12-17-2022 and she is drinking at least a half a pint of liquor every day or more.  Her daughter who is an Therapist, sports here at [employer] confirms this.  She has had elevated liver tests recently.  She is seen Dr. Quay Burow also goes to [facility] when she is unable to get in with her PCP or other doctors.  CT scan was apparently done there recently results are known.  She has had epigastric pain nausea and vomiting for approximately 2 weeks.  No hematemesis etc.Labs revealed normal WBC but lipase 739 with total bili 2.6.  Past Medical History:  Diagnosis Date  . Allergy   . Arthritis   . Back pain   . Colon cancer ([place])   . Depression   . Diarrhea   . GERD (gastroesophageal reflux disease)   . Hypertension   . Insomnia   . MS (multiple sclerosis) ([place])   . Other hammer toe (acquired) 03/31/2013  . Pneumonia    hx of  . Umbilical hernia     Past Surgical History:  Procedure Laterality Date  . ABDOMINAL HYSTERECTOMY    . ANTERIOR CERVICAL DECOMP/DISCECTOMY FUSION N/A 03/01/2014   Procedure:  ANTERIOR CERVICAL DECOMPRESSION/DISCECTOMY FUSION 1 LEVEL;  Surgeon: Charlie Pitter, MD;  Location: [place] [facility];  Service: Neurosurgery;  Laterality: N/A;  ANTERIOR CERVICAL DECOMPRESSION/DISCECTOMY FUSION 1 LEVEL  . BUNIONECTOMY Right 10/14/2014   _0   . CESAREAN SECTION    . COLON SURGERY    . COLONOSCOPY    . FOOT SURGERY Right   . GASTRIC BYPASS    . Hammer Toe Repair Right 10/14/2014   RT #2, _1   . TENOTOMY Right 10/14/2014   RT #3, _2     Family History  Problem Relation Age of Onset  . Stroke Mother   . Hypertension Mother   . Hyperlipidemia Mother   . Diabetes Mother   . Diabetes Brother   . Hypertension Brother   . Hypertension Brother   . Hypertension Brother   . Hypertension Brother   . Hypertension Brother   . Alcoholism Brother     Social History:  reports that she has been smoking cigarettes. She has a 5.00 pack-year smoking history. She has never used smokeless tobacco. She reports that she drinks alcohol. She reports that she does not use drugs.  Allergies:  Allergies  Allergen Reactions  . Phenergan [Promethazine Hcl] Anxiety  . Promethazine Anxiety  . Trazodone And Nefazodone Rash    Medications; Prior to Admission medications   Medication Sig Start Date  End Date Taking? Authorizing Provider  Cyanocobalamin (VITAMIN B-12 IJ) Inject 1 Dose as directed every 30 (thirty) days.    Yes [provider]  diclofenac sodium (VOLTAREN) 1 % GEL Apply 2 g topically 4 (four) times daily.  01/15/18  Yes [provider]  gabapentin (NEURONTIN) 300 MG capsule One po qAM, two po q PM and 2 po q HS Patient taking differently: Take 300-600 mg by mouth See admin instructions. One capsule (3109m) qAM, two Capsules (6036m q PM and 2 capsules (60020mq HS 01/29/17  Yes Sater, RicNanine MeansD  hydrochlorothiazide (MICROZIDE) 12.5 MG capsule Take 1 capsule (12.5 mg total) by mouth daily. 04/17/17  Yes BurBinnie RailD  lisinopril (PRINIVIL,ZESTRIL) 20 MG  tablet  10/23/16  Yes [provider]  NARCAN 4 MG/0.1ML LIQD nasal spray kit  11/21/17  Yes [provider]  ondansetron (ZOFRAN) 4 MG tablet Take 4 mg by mouth every 6 (six) hours as needed for nausea. 02/11/18  Yes [provider]  Oxycodone HCl 10 MG TABS Take 10 mg by mouth 5 (five) times daily.  12/07/16  Yes [provider]  VENTOLIN HFA 108 (90 Base) MCG/ACT inhaler  11/08/16  Yes [provider]  XTAMPZA ER 9 MG C12A Take 9 mg by mouth every 12 (twelve) hours.  12/07/16  Yes [provider]   . enoxaparin (LOVENOX) injection  40 mg Subcutaneous Q24H  . folic acid  1 mg Oral Daily  . [START ON 02/23/2018] gabapentin  300 mg Oral BH-q7a  . gabapentin  600 mg Oral QPM  . gabapentin  600 mg Oral QHS  . [START ON 02/23/2018] hydrochlorothiazide  12.5 mg Oral Daily  . insulin aspart  0-20 Units Subcutaneous TID WC  . insulin aspart  0-5 Units Subcutaneous QHS  . [START ON 02/23/2018] lisinopril  20 mg Oral Daily  . LORazepam  0-4 mg Intravenous Q6H   Followed by  . [START ON 02/24/2018] LORazepam  0-4 mg Intravenous Q12H  . multivitamin with minerals  1 tablet Oral Daily  . oxyCODONE  10 mg Oral 5 X Daily  . oxyCODONE  10 mg Oral Q12H  . sodium chloride flush  3 mL Intravenous Q12H  . sodium chloride flush  3 mL Intravenous Q12H  . thiamine  100 mg Oral Daily   Or  . thiamine  100 mg Intravenous Daily   PRN Meds sodium chloride, acetaminophen **OR** acetaminophen, albuterol, HYDROmorphone (DILAUDID) injection, LORazepam **OR** LORazepam, ondansetron **OR** ondansetron (ZOFRAN) IV, oxyCODONE, sodium chloride flush, zolpidem Results for orders placed or performed during the hospital encounter of 02/22/18 (from the past 48 hour(s))  Blood Culture (routine x 2)     Status: None (Preliminary result)   Collection Time: 02/22/18 10:52 AM  Result Value Ref Range   Specimen Description BLOOD LEFT ANTECUBITAL    Special Requests      BOTTLES  DRAWN AEROBIC AND ANAEROBIC Blood Culture results may not be optimal due to an inadequate volume of blood received in culture bottles   Culture      NO GROWTH < 12 HOURS Performed at [facility] [id number] Report Status PENDING   Comprehensive metabolic panel     Status: Abnormal   Collection Time: 02/22/18 11:15 AM  Result Value Ref Range   Sodium 139 135 - 145 mmol/L   Potassium 4.8 3.5 - 5.1 mmol/L   Chloride 107 98 - 111  mmol/L   CO2 22 22 - 32 mmol/L   Glucose, Bld 126 (H) 70 - 99 mg/dL   BUN 23 (H) 6 - 20 mg/dL   Creatinine, Ser 1.30 (H) 0.44 - 1.00 mg/dL   Calcium 9.5 8.9 - 10.3 mg/dL   Total Protein 6.9 6.5 - 8.1 g/dL   Albumin 3.4 (L) 3.5 - 5.0 g/dL   AST 573 (H) 15 - 41 U/L   ALT 153 (H) 0 - 44 U/L   Alkaline Phosphatase 152 (H) 38 - 126 U/L   Total Bilirubin 2.6 (H) 0.3 - 1.2 mg/dL   GFR calc non Af Amer 46 (L) >60 mL/min   GFR calc Af Amer 53 (L) >60 mL/min   Anion gap 10 5 - 15    Comment: Performed at [facility], [address], [place], [place] [address]  CBC WITH DIFFERENTIAL     Status: Abnormal   Collection Time: 02/22/18 11:15 AM  Result Value Ref Range   WBC 9.1 4.0 - 10.5 K/uL   RBC 3.72 (L) 3.87 - 5.11 MIL/uL   Hemoglobin 11.4 (L) 12.0 - 15.0 g/dL   HCT 38.0 36.0 - 46.0 %   MCV 102.2 (H) 80.0 - 100.0 fL   MCH 30.6 26.0 - 34.0 pg   MCHC 30.0 30.0 - 36.0 g/dL   RDW 13.5 11.5 - 15.5 %   Platelets 177 150 - 400 K/uL   nRBC 0.0 0.0 - 0.2 %   Neutrophils Relative % 92 %   Neutro Abs 8.3 (H) 1.7 - 7.7 K/uL   Lymphocytes Relative 2 %   Lymphs Abs 0.2 (L) 0.7 - 4.0 K/uL   Monocytes Relative 5 %   Monocytes Absolute 0.5 0.1 - 1.0 K/uL   Eosinophils Relative 0 %   Eosinophils Absolute 0.0 0.0 - 0.5 K/uL   Basophils Relative 0 %   Basophils Absolute 0.0 0.0 - 0.1 K/uL   Immature Granulocytes 1 %   Abs Immature Granulocytes 0.06 0.00 - 0.07 K/uL    Comment: Performed at [facility], [address], [place], [place] [address]  Lipase, blood     Status: Abnormal   Collection Time: 02/22/18 11:15 AM  Result Value Ref Range   Lipase 739 (H) 11 - 51 U/L    Comment: RESULTS CONFIRMED BY MANUAL DILUTION Performed at [facility], [place] [address] [place], [place] [address]   I-Stat CG4 Lactic Acid, ED     Status: None   Collection Time: 02/22/18 11:24 AM  Result Value Ref Range   Lactic Acid, Venous 1.87 0.5 - 1.9 mmol/L  Blood Culture (routine x 2)     Status: None (Preliminary result)   Collection Time: 02/22/18 12:58 PM  Result Value Ref Range   Specimen Description BLOOD SITE NOT SPECIFIED    Special Requests      BOTTLES DRAWN AEROBIC AND ANAEROBIC Blood Culture results may not be optimal due to an inadequate volume of blood received in culture bottles   Culture      NO GROWTH <12 HOURS Performed at [facility] [address] [place], [place] [address]    Report Status PENDING   I-Stat CG4 Lactic Acid, ED     Status: None   Collection Time: 02/22/18  1:09 PM  Result Value Ref Range   Lactic Acid, Venous 1.39 0.5 - 1.9 mmol/L  Urinalysis, Routine w reflex microscopic     Status: Abnormal  Collection Time: 02/22/18  3:10 PM  Result Value Ref Range   Color, Urine AMBER (A) YELLOW    Comment: BIOCHEMICALS MAY BE AFFECTED BY COLOR   APPearance HAZY (A) CLEAR   Specific Gravity, Urine 1.015 1.005 - 1.030   pH 5.0 5.0 - 8.0   Glucose, UA NEGATIVE NEGATIVE mg/dL   Hgb urine dipstick NEGATIVE NEGATIVE   Bilirubin Urine NEGATIVE NEGATIVE   Ketones, ur NEGATIVE NEGATIVE mg/dL   Protein, ur NEGATIVE NEGATIVE mg/dL   Nitrite NEGATIVE NEGATIVE   Leukocytes, UA NEGATIVE NEGATIVE    Comment: Performed at [facility] [address], [place], [place] [address]  CBC     Status: Abnormal   Collection Time: 02/22/18  5:34 PM  Result Value Ref Range   WBC 8.2 4.0 - 10.5 K/uL   RBC 3.66 (L) 3.87 - 5.11 MIL/uL   Hemoglobin 11.3 (L) 12.0 - 15.0 g/dL   HCT 37.5 36.0 -  46.0 %   MCV 102.5 (H) 80.0 - 100.0 fL   MCH 30.9 26.0 - 34.0 pg   MCHC 30.1 30.0 - 36.0 g/dL   RDW 13.6 11.5 - 15.5 %   Platelets 137 (L) 150 - 400 K/uL   nRBC 0.0 0.0 - 0.2 %    Comment: Performed at [facility], [place] [address], [place], [place] [address]  Creatinine, serum     Status: Abnormal   Collection Time: 02/22/18  5:34 PM  Result Value Ref Range   Creatinine, Ser 1.20 (H) 0.44 - 1.00 mg/dL   GFR calc non Af Amer 50 (L) >60 mL/min   GFR calc Af Amer 59 (L) >60 mL/min    Comment: Performed at [facility] [address] [place], [place] [address]  Hemoglobin A1c     Status: None   Collection Time: 02/22/18  5:34 PM  Result Value Ref Range   Hgb A1c MFr Bld 5.1 4.8 - 5.6 %    Comment: (NOTE) Pre diabetes:          5.7%-6.4% Diabetes:              >6.4% Glycemic control for   <7.0% adults with diabetes    Mean Plasma Glucose 99.67 mg/dL    Comment: Performed at [facility] [address] [place], [place] [address]    Ct Abdomen Pelvis W Contrast  Result Date: 02/22/2018 CLINICAL DATA:  Upper abdominal pain for 2 weeks. EXAM: CT ABDOMEN AND PELVIS WITH CONTRAST TECHNIQUE: Multidetector CT imaging of the abdomen and pelvis was performed using the standard protocol following bolus administration of intravenous contrast. CONTRAST:  166m OMNIPAQUE IOHEXOL 300 MG/ML  SOLN COMPARISON:  CT scan 09/17/2017 FINDINGS: Lower chest: The lung bases are clear except for areas of subpleural atelectasis. No worrisome pulmonary lesions. The heart is within normal limits in size. Age advanced coronary artery calcifications are noted. Hepatobiliary: No focal hepatic lesions or intrahepatic biliary dilatation. There is layering sludge and numerous small gallstones in the gallbladder. I do not see any definite CT findings for acute cholecystitis and there is no common bile duct dilatation. Pancreas: No mass, inflammation or ductal dilatation. Spleen: Normal size.  No focal  lesions. Adrenals/Urinary Tract: The adrenal glands are normal. No renal, ureteral or bladder calculi or mass. Stomach/Bowel: Surgical changes from gastric bypass surgery. No complicating features are identified. The small bowel and colon are grossly normal without oral contrast. No obvious acute inflammatory changes, mass lesions or  obstructive findings. Stable surgical changes involving the right colon from probable partial right hemicolectomy. Vascular/Lymphatic: Moderate atherosclerotic calcifications involving the aorta but no aneurysm or dissection. The branch vessels are patent. The major venous structures are patent. Reproductive: The uterus is surgically absent. Both ovaries are still present and appear normal. Other: Surgical changes from anterior abdominal wall hernia repair with mesh. No findings for recurrent hernia. Musculoskeletal: No significant bony findings. IMPRESSION: 1. Cholelithiasis and layering sludge in the gallbladder but no definite CT findings for acute cholecystitis and no intra or extrahepatic biliary dilatation. 2. No renal, ureteral or bladder calculi or mass. 3. Surgical changes from gastric bypass surgery without complicating features. 4. Surgical changes from anterior abdominal wall hernia repair with mesh. No recurrent hernia. 5. Partial right hemicolectomy. Electronically Signed   By: Marijo Sanes M.D.   On: 02/22/2018 17:19   US Abdomen Limited Ruq  Result Date: 02/22/2018 CLINICAL DATA:  Right upper quadrant pain, abdominal pain EXAM: ULTRASOUND ABDOMEN LIMITED RIGHT UPPER QUADRANT COMPARISON:  None. FINDINGS: Gallbladder: Cholelithiasis. No pericholecystic fluid or gallbladder wall thickening. Largest gallstone measures 9 mm. No sonographic Murphy sign noted by sonographer. Common bile duct: Diameter: 7 mm Liver: 10 x 7 x 10 mm anechoic left hepatic mass most consistent with a cyst. Similar 8 x 8 x 12 mm anechoic left hepatic mass most consistent with a cyst. Normal  hepatic echogenicity. Portal vein is patent on color Doppler imaging with normal direction of blood flow towards the liver. IMPRESSION: 1. Cholelithiasis without sonographic evidence of acute cholecystitis. Electronically Signed   By: Kathreen Devoid   On: 02/22/2018 12:45               Blood pressure 140/85, pulse (!) 107, temperature 99.5 F (37.5 C), temperature source Oral, resp. rate 17, height _0  (1.676 m), weight 108 kg, SpO2 97 %.  Physical exam:   General--somewhat obese African-American female in no acute distress ENT--nonicteric Neck--supple no lymphadenopathy Heart--regular rate and rhythm no murmurs Lungs--clear Abdomen--mild epigastric tenderness Psych--alert and oriented x3 mood and judgment appear to be normal.   Assessment: 1.  Acute pancreatitis.  Ultrasound does show gallstones but she does have marked increase in alcohol intake after her son's unexpected death.  Another CT was ordered and is still pending. 2.  Status post gastric bypass 3.  History of right colectomy due to adenocarcinoma of the cecum.  Patient has had surveillance colonoscopies most recently done by Dr. Paulita Fujita  Plan: 1.  Continue to treat with pain medication and rest of the pancreas now. 2.  We will check the results of the CT scan.   [name] 02/22/2018, 7:36 PM   This note was created using voice recognition software and minor errors may Have occurred unintentionally. Pager: [phone] If no answer or after hours call [phone]

## 2018-02-22 NOTE — ED Triage Notes (Addendum)
Pt from home, c/o RUQ abd pain x 2 weeks; seen at [facility] the November 28th for CT scan; has been told previously that she has a hernia; pt states she has been feeling "hot and cold for 1 week"; pt has chronic back pain; pt has decreased appetite; denies vomiting; pt states that she drinks 1/2 pint liquor every 4-5 days/week; states her son passed away in January 08, 2023 of this year

## 2018-02-22 NOTE — H&P (Signed)
History and Physical    Misty [name] YHC:[id number] DOB: September 23, 1960 DOA: 02/22/2018  PCP: [name], MD  Patient coming from: Home  I have personally briefly reviewed patient's old medical records in [place]  Chief Complaint: Abdominal pain for the past several days  HPI: Misty [name] is a 57 y.o. female with medical history significant of chronic back pain, colon cancer, gastroesophageal reflux disease, hypertension, MS, gastric bypass who presents emergency department for complaint of epigastric and right upper abdominal pain.  Pain is been present intermittently for 2 weeks it is constant and severe in nature.  It is associated with nausea and decreased appetite.  Has had sweats and chills for the last week but has had no vomiting she reports chronic diarrhea since being diagnosed with her colon cancer which is unchanged.  Is had no bloody stools or hematemesis has not eaten for the last several days.  Pain does not radiate it is epigastric in nature but does not go to the back it is sharp.  Seen at urgent care a week ago had a normal x-ray and was given Zofran.  CT scan done at [facility] but results are not yet available.  In the past she has been told that it is her hernia that is causing the pain.  Of note the patient's son died in 12/22/2022 and since then she has been drinking fairly heavily.  She is drinking about 1/2 pint of liquor every day.  Daughter is concerned that she may go into DTs.  ED Course: LFTs were obtained and markedly elevated, patient has a fever, her bilirubin is 2.6, ultrasound did not reveal dilated ducts but did show a gallstone.  The eye was consulted, Dr. Oletta Lamas who opined the patient may have alcoholic pancreatitis.  Referred to me for further evaluation and management  Review of Systems: As per HPI otherwise all other systems reviewed and  negative.    Past Medical History:  Diagnosis Date  . Allergy   . Arthritis   .  Back pain   . Colon cancer ([place])   . Depression   . Diarrhea   . GERD (gastroesophageal reflux disease)   . Hypertension   . Insomnia   . MS (multiple sclerosis) ([place])   . Other hammer toe (acquired) 03/31/2013  . Pneumonia    hx of  . Umbilical hernia     Past Surgical History:  Procedure Laterality Date  . ABDOMINAL HYSTERECTOMY    . ANTERIOR CERVICAL DECOMP/DISCECTOMY FUSION N/A 03/01/2014   Procedure: ANTERIOR CERVICAL DECOMPRESSION/DISCECTOMY FUSION 1 LEVEL;  Surgeon: Charlie Pitter, MD;  Location: [facility];  Service: Neurosurgery;  Laterality: N/A;  ANTERIOR CERVICAL DECOMPRESSION/DISCECTOMY FUSION 1 LEVEL  . BUNIONECTOMY Right 10/14/2014   _0   . CESAREAN SECTION    . COLON SURGERY    . COLONOSCOPY    . FOOT SURGERY Right   . GASTRIC BYPASS    . Hammer Toe Repair Right 10/14/2014   RT #2, _1   . TENOTOMY Right 10/14/2014   RT #3, _2     Social History   Social History Narrative  . Not on file     reports that she has been smoking cigarettes. She has a 5.00 pack-year smoking history. She has never used smokeless tobacco. She reports that she drinks alcohol. She reports that she does not use drugs.  Allergies  Allergen Reactions  . Phenergan [Promethazine Hcl] Anxiety  . Promethazine Anxiety  . Trazodone And  Nefazodone Rash    Family History  Problem Relation Age of Onset  . Stroke Mother   . Hypertension Mother   . Hyperlipidemia Mother   . Diabetes Mother   . Diabetes Brother   . Hypertension Brother   . Hypertension Brother   . Hypertension Brother   . Hypertension Brother   . Hypertension Brother   . Alcoholism Brother      Prior to Admission medications   Medication Sig Start Date End Date Taking? Authorizing Provider  Cyanocobalamin (VITAMIN B-12 IJ) Inject 1 Dose as directed every 30 (thirty) days.    Yes [provider]  diclofenac sodium (VOLTAREN) 1 % GEL Apply 2 g topically 4 (four) times daily.  01/15/18  Yes [provider]  gabapentin (NEURONTIN) 300 MG capsule One po qAM, two po q PM and 2 po q HS Patient taking differently: Take 300-600 mg by mouth See admin instructions. One capsule (338m) qAM, two Capsules (6056m q PM and 2 capsules (60055mq HS 01/29/17  Yes Sater, RicNanine MeansD  hydrochlorothiazide (MICROZIDE) 12.5 MG capsule Take 1 capsule (12.5 mg total) by mouth daily. 04/17/17  Yes BurBinnie RailD  lisinopril (PRINIVIL,ZESTRIL) 20 MG tablet  10/23/16  Yes [provider]  NARCAN 4 MG/0.1ML LIQD nasal spray kit  11/21/17  Yes [provider]  ondansetron (ZOFRAN) 4 MG tablet Take 4 mg by mouth every 6 (six) hours as needed for nausea. 02/11/18  Yes [provider]  Oxycodone HCl 10 MG TABS Take 10 mg by mouth 5 (five) times daily.  12/07/16  Yes [provider]  VENTOLIN HFA 108 (90 Base) MCG/ACT inhaler  11/08/16  Yes [provider]  XTAMPZA ER 9 MG C12A Take 9 mg by mouth every 12 (twelve) hours.  12/07/16  Yes [provider]    Physical Exam:  Constitutional: NAD, calm, abdominal discomfort Vitals:   02/22/18 1415 02/22/18 1445 02/22/18 1457 02/22/18 1530  BP: 100/67 114/70  115/62  Pulse: (!) 105 95  92  Resp: _0 Temp:   98.8 F (37.1 C)   TempSrc:   Oral   SpO2: 94% 94%  97%  Weight:      Height:       Eyes: PERRL, lids and conjunctivae normal ENMT: Mucous membranes are moist. Posterior pharynx clear of any exudate or lesions.Normal dentition.  Neck: normal, supple, no masses, no thyromegaly Respiratory: clear to auscultation bilaterally, no wheezing, no crackles. Normal respiratory effort. No accessory muscle use.  Cardiovascular: Regular rate and rhythm, no murmurs / rubs / gallops. No extremity edema. 2+ pedal pulses. No carotid bruits.  Abdomen: Right abdominal tenderness, no masses palpated. No hepatosplenomegaly. Bowel sounds are hypoactive Musculoskeletal: no clubbing / cyanosis. No joint deformity upper  and lower extremities. Good ROM, no contractures. Normal muscle tone.  Skin: no rashes, lesions, ulcers. No induration Neurologic: CN 2-12 grossly intact. Sensation intact, DTR normal. Strength 5/5 in all 4.  Psychiatric: Normal judgment and insight. Alert and oriented x 3. Normal mood.    Labs on Admission: I have personally reviewed following labs and imaging studies  CBC: Recent Labs  Lab 02/22/18 1115  WBC 9.1  NEUTROABS 8.3*  HGB 11.4*  HCT 38.0  MCV 102.2*  PLT [id number] Metabolic Panel: Recent Labs  Lab 02/22/18 1115  NA 139  K 4.8  CL 107  CO2 22  GLUCOSE 126*  BUN 23*  CREATININE 1.30*  CALCIUM 9.5   GFR: Estimated Creatinine Clearance: 60.1 mL/min (A) (by C-G formula based on SCr of 1.3 mg/dL (H)). Liver Function Tests: Recent Labs  Lab 02/22/18 1115  AST 573*  ALT 153*  ALKPHOS 152*  BILITOT 2.6*  PROT 6.9  ALBUMIN 3.4*   Recent Labs  Lab 02/22/18 1115  LIPASE 739*   Urine analysis:    Component Value Date/Time   COLORURINE AMBER (A) 02/22/2018 1510   APPEARANCEUR HAZY (A) 02/22/2018 1510   LABSPEC 1.015 02/22/2018 1510   PHURINE 5.0 02/22/2018 1510   GLUCOSEU NEGATIVE 02/22/2018 1510   HGBUR NEGATIVE 02/22/2018 1510   HGBUR negative 04/14/2010 0917   BILIRUBINUR NEGATIVE 02/22/2018 1510   BILIRUBINUR neg 09/16/2017 1600   KETONESUR NEGATIVE 02/22/2018 1510   PROTEINUR NEGATIVE 02/22/2018 1510   UROBILINOGEN 0.2 09/16/2017 1600   UROBILINOGEN 1.0 08/28/2011 1359   NITRITE NEGATIVE 02/22/2018 1510   LEUKOCYTESUR NEGATIVE 02/22/2018 1510    Radiological Exams on Admission: US Abdomen Limited Ruq  Result Date: 02/22/2018 CLINICAL DATA:  Right upper quadrant pain, abdominal pain EXAM: ULTRASOUND ABDOMEN LIMITED RIGHT UPPER QUADRANT COMPARISON:  None. FINDINGS: Gallbladder: Cholelithiasis. No pericholecystic fluid or gallbladder wall thickening. Largest gallstone measures 9 mm. No sonographic Murphy sign noted by sonographer. Common  bile duct: Diameter: 7 mm Liver: 10 x 7 x 10 mm anechoic left hepatic mass most consistent with a cyst. Similar 8 x 8 x 12 mm anechoic left hepatic mass most consistent with a cyst. Normal hepatic echogenicity. Portal vein is patent on color Doppler imaging with normal direction of blood flow towards the liver. IMPRESSION: 1. Cholelithiasis without sonographic evidence of acute cholecystitis. Electronically Signed   By: Kathreen Devoid   On: 02/22/2018 12:45   EKG: Independently reviewed.  Sinus tachycardia at 120 bpm  Assessment/Plan Principal Problem:   Pancreatitis Active Problems:   Heavy alcohol use   Cholelithiasis   HYPERTENSION, BENIGN ESSENTIAL   Sleep difficulties   Chronic pain syndrome   OBESITY   Irritable bowel syndrome  1.  Pancreatitis: Possibility exists the patient has either alcoholic or gallstone related pancreatitis.  Ducts are not dilated however.  This point would recommend treating with IV fluids and IV pain medication and bowel rest.  If patient does not improve repeat CT scan and possible iteration of HIDA scan once symptoms resolved.  To be admitted into the hospital kept n.p.o. given IV fluids and IV pain medication.  At 10 PM tonight she can start some ice chips if she can tolerate them and has no nausea or vomiting at that point.  2.  Heavy alcohol use: Her son passed away in Dec 13, 2022 and since then she has had significant grief.  He died suddenly.  And it was very unexpected.  She may be using alcohol to cope with her grief at night.  Dates she drinks nightly.  She does not drink during the day.  Spiritual care has been consulted.  3.  Cholelithiasis: Does not appear to have acute choledocholithiasis with associated pancreatitis however not improve patient might benefit from a further investigation.  She may require outpatient further evaluation.  4.  Hypertension, benign essential: Continue home medication.  5.  Sleep difficulties: Patient states she is having a  hard time sleeping at night I have started some sleep medication while here in the hospital.  She will probably benefit from some therapy and behavioral management.  6.  Chronic pain syndrome: Patient takes long-acting narcotics at home for chronic pain  we will continue these medications at discharge for now we will give her IV pain medication given that she is n.p.o.  7.  Obesity: Weight loss is encouraged.  8.  Irritable bowel syndrome: Likely this was diagnosed after her colon cancer she has diarrhea frequently.  There has been no change in her stool patterns.  DVT prophylaxis: Lovenox Code Status: Full code Family Communication: Spoke with patient's daughter by phone.  Charolotte Eke) Disposition Plan: [facility] home in 3 to 4 days Consults called: GI Dr. Percell Miller Admission status: Inpatient   Lady Deutscher MD [facility] Pager [phone]  If 7PM-7AM, please contact night-coverage [email or web address] Password [facility]  02/22/2018, 4:47 PM

## 2018-02-22 NOTE — ED Provider Notes (Signed)
[facility]ER[facility]ENCY DEPARTMENT Provider Note   CSN: [id number] Arrival date & time: 02/22/18  1032     History   Chief Complaint Chief Complaint  Patient presents with  . Abdominal Pain    HPI Misty [name] is a 57 y.o. female.  HPI   Patient is a 58 year old female with a history of chronic back pain, colon cancer, GERD, hypertension, MS, gastric bypass, who presents emergency department today for evaluation of epigastric and right upper quadrant abdominal pain that she states has been present for the last 2 weeks.  Pain is constant and severe in nature.  It is associated with nausea and decreased appetite.  Also associated with sweats and chills for the last week.  Denies vomiting.  Reports chronic diarrhea since being diagnosed with colon cancer, unchanged today.  No bloody stools or hematemesis.  States she has not eaten for the last several days.  Patient reports that she was seen at urgent care about 1 week ago and had a normal x-ray.  She underwent outpatient CT scan at [facility] but has not received the results yet.  She states she was told she had a hernia in the past and her provider told her that was what was causing her current pain.  States she was given Zofran for her symptoms without relief.  Past Medical History:  Diagnosis Date  . Allergy   . Arthritis   . Back pain   . Colon cancer ([place])   . Depression   . Diarrhea   . GERD (gastroesophageal reflux disease)   . Hypertension   . Insomnia   . MS (multiple sclerosis) ([place])   . Other hammer toe (acquired) 03/31/2013  . Pneumonia    hx of  . Umbilical hernia     Patient Active Problem List   Diagnosis Date Noted  . Pancreatitis 02/22/2018  . Heavy alcohol use 02/22/2018  . Cholelithiasis 02/22/2018  . Skin eschar 01/28/2018  . Adhesive capsulitis of right shoulder 01/20/2018  . Acute pain of right shoulder 11/15/2017  . Ventral hernia 09/16/2017  . Acute right flank  pain 09/16/2017  . Abdominal pain 09/16/2017  . Greater trochanteric bursitis of right hip 04/30/2017  . Groin pain, right 04/17/2017  . Prediabetes 04/16/2017  . Arm pain, left 01/29/2017  . Numbness 01/29/2017  . Chronic midline low back pain 07/20/2016  . Sacral mass 07/20/2016  . Osteoarthritis of right hip 07/20/2016  . Spinal stenosis of lumbar region 11/29/2015  . Positive urine drug screen 11/16/2015  . Chronic pain syndrome 10/12/2015  . Umbilical hernia 02/54/2706  . Sleep difficulties 07/15/2015  . Bunion, right 06/20/2015  . Callus of foot 04/27/2015  . Muscle spasm 04/16/2015  . B12 deficiency 03/30/2015  . Hammer toe of left foot 02/16/2015  . Fibrosis of skin of lower extremity 02/16/2015  . Status post right foot surgery 10/26/2014  . Cervical spondylosis with radiculopathy 03/01/2014  . Hammer toe of right foot 01/26/2014  . Benign intracranial hypertension 06/18/2013  . Pain in lower limb 05/22/2013  . Abnormal gait 04/21/2013  . Arthritis 04/21/2013  . Disturbance of skin sensation 04/21/2013  . Cephalalgia 04/21/2013  . Porokeratosis 03/31/2013  . Vitamin D deficiency 04/18/2010  . Fatigue 04/14/2010  . Back pain with right-sided radiculopathy 11/29/2008  . MEDIAL MENISCUS TEAR, LEFT 06/30/2008  . BARRETTS ESOPHAGUS 06/28/2008  . OSTEOARTHRITIS, KNEES, BILATERAL 06/28/2008  . OBESITY 05/24/2008  . HYPERTENSION, BENIGN ESSENTIAL 05/24/2008  . Irritable  bowel syndrome 05/24/2008  . BARIATRIC SURGERY STATUS 03/19/2006  . NEOPLASM, MALIGNANT, COLON, HX OF 03/19/1997    Past Surgical History:  Procedure Laterality Date  . ABDOMINAL HYSTERECTOMY    . ANTERIOR CERVICAL DECOMP/DISCECTOMY FUSION N/A 03/01/2014   Procedure: ANTERIOR CERVICAL DECOMPRESSION/DISCECTOMY FUSION 1 LEVEL;  Surgeon: Charlie Pitter, MD;  Location: [facility];  Service: Neurosurgery;  Laterality: N/A;  ANTERIOR CERVICAL DECOMPRESSION/DISCECTOMY FUSION 1 LEVEL  . BUNIONECTOMY Right  10/14/2014   '@[facility]'$   . CESAREAN SECTION    . COLON SURGERY    . COLONOSCOPY    . FOOT SURGERY Right   . GASTRIC BYPASS    . Hammer Toe Repair Right 10/14/2014   RT #2, '@[facility]'$   . TENOTOMY Right 10/14/2014   RT #3, '@[facility]'$      OB History   None      Home Medications    Prior to Admission medications   Medication Sig Start Date End Date Taking? Authorizing Provider  Cyanocobalamin (VITAMIN B-12 IJ) Inject 1 Dose as directed every 30 (thirty) days.    Yes [provider]  diclofenac sodium (VOLTAREN) 1 % GEL Apply 2 g topically 4 (four) times daily.  01/15/18  Yes [provider]  gabapentin (NEURONTIN) 300 MG capsule One po qAM, two po q PM and 2 po q HS Patient taking differently: Take 300-600 mg by mouth See admin instructions. One capsule ('300mg'$ ) qAM, two Capsules ('600mg'$ ) q PM and 2 capsules ('600mg'$ ) q HS 01/29/17  Yes Sater, Nanine Means, MD  hydrochlorothiazide (MICROZIDE) 12.5 MG capsule Take 1 capsule (12.5 mg total) by mouth daily. 04/17/17  Yes [name], MD  lisinopril (PRINIVIL,ZESTRIL) 20 MG tablet  10/23/16  Yes [provider]  NARCAN 4 MG/0.1ML LIQD nasal spray kit  11/21/17  Yes [provider]  ondansetron (ZOFRAN) 4 MG tablet Take 4 mg by mouth every 6 (six) hours as needed for nausea. 02/11/18  Yes [provider]  Oxycodone HCl 10 MG TABS Take 10 mg by mouth 5 (five) times daily.  12/07/16  Yes [provider]  VENTOLIN HFA 108 (90 Base) MCG/ACT inhaler  11/08/16  Yes [provider]  XTAMPZA ER 9 MG C12A Take 9 mg by mouth every 12 (twelve) hours.  12/07/16  Yes [provider]    Family History Family History  Problem Relation Age of Onset  . Stroke Mother   . Hypertension Mother   . Hyperlipidemia Mother   . Diabetes Mother   . Diabetes Brother   . Hypertension Brother   . Hypertension Brother   . Hypertension Brother   . Hypertension Brother   . Hypertension Brother   . Alcoholism Brother      Social History Social History   Tobacco Use  . Smoking status: Current Some Day Smoker    Packs/day: 0.25    Years: 20.00    Pack years: 5.00    Types: Cigarettes  . Smokeless tobacco: Never Used  Substance Use Topics  . Alcohol use: Yes    Alcohol/week: 0.0 standard drinks    Comment: 1/2 pint daily  . Drug use: No     Allergies   Phenergan [promethazine hcl]; Promethazine; and Trazodone and nefazodone   Review of Systems Review of Systems  Constitutional: Positive for appetite change, chills and diaphoresis.  HENT: Negative for congestion.   Eyes: Negative for visual disturbance.  Respiratory: Negative for cough and shortness of breath.   Cardiovascular: Negative for chest pain.  Gastrointestinal: Positive for abdominal pain, diarrhea (chronic, unchanged) and nausea. Negative for constipation and vomiting.  Genitourinary: Positive for flank pain. Negative for dysuria, frequency and urgency.  Musculoskeletal: Positive for back pain (chronic).  Skin: Negative for rash.  Neurological: Negative for headaches.    Physical Exam Updated Vital Signs BP 132/80 (BP Location: Right Arm)   Pulse (!) 113   Temp 99.2 F (37.3 C) (Oral)   Resp 14   Ht 5' 6"  (1.676 m)   Wt 112.9 kg   SpO2 93%   BMI 40.19 kg/m   Physical Exam  Constitutional: She appears well-developed and well-nourished.  Appears uncomfortable  HENT:  Head: Normocephalic and atraumatic.  Dry mucous membranes  Eyes: Conjunctivae are normal.  Neck: Neck supple.  Cardiovascular: Regular rhythm and intact distal pulses.  No murmur heard. tachcyardic  Pulmonary/Chest: Effort normal and breath sounds normal. No respiratory distress. She has no wheezes.  Abdominal: Soft. There is tenderness in the right upper quadrant and epigastric area. There is guarding. There is no rigidity.  No cva ttp. Obese abdomen.  Musculoskeletal: She exhibits no edema.  Neurological: She is alert.  Skin: Skin is warm and  dry. Capillary refill takes less than 2 seconds.  Psychiatric: She has a normal mood and affect.  Nursing note and vitals reviewed.    ED Treatments / Results  Labs (all labs ordered are listed, but only abnormal results are displayed) Labs Reviewed  COMPREHENSIVE METABOLIC PANEL - Abnormal; Notable for the following components:      Result Value   Glucose, Bld 126 (*)    BUN 23 (*)    Creatinine, Ser 1.30 (*)    Albumin 3.4 (*)    AST 573 (*)    ALT 153 (*)    Alkaline Phosphatase 152 (*)    Total Bilirubin 2.6 (*)    GFR calc non Af Amer 46 (*)    GFR calc Af Amer 53 (*)    All other components within normal limits  CBC WITH DIFFERENTIAL/PLATELET - Abnormal; Notable for the following components:   RBC 3.72 (*)    Hemoglobin 11.4 (*)    MCV 102.2 (*)    Neutro Abs 8.3 (*)    Lymphs Abs 0.2 (*)    All other components within normal limits  LIPASE, BLOOD - Abnormal; Notable for the following components:   Lipase 739 (*)    All other components within normal limits  URINALYSIS, ROUTINE W REFLEX MICROSCOPIC - Abnormal; Notable for the following components:   Color, Urine AMBER (*)    APPearance HAZY (*)    All other components within normal limits  CBC - Abnormal; Notable for the following components:   RBC 3.66 (*)    Hemoglobin 11.3 (*)    MCV 102.5 (*)    Platelets 137 (*)    All other components within normal limits  CREATININE, SERUM - Abnormal; Notable for the following components:   Creatinine, Ser 1.20 (*)    GFR calc non Af Amer 50 (*)    GFR calc Af Amer 59 (*)    All other components within normal limits  CBC - Abnormal; Notable for the following components:   RBC 3.30 (*)    Hemoglobin 10.2 (*)    HCT 33.3 (*)    MCV 100.9 (*)    Platelets 128 (*)    All other components within normal limits  CULTURE, BLOOD (ROUTINE X 2)  CULTURE, BLOOD (ROUTINE X 2)  HEMOGLOBIN  A1C  GLUCOSE, CAPILLARY  HIV ANTIBODY (ROUTINE TESTING W REFLEX)  COMPREHENSIVE  METABOLIC PANEL  I-STAT CG4 LACTIC ACID, ED  I-STAT CG4 LACTIC ACID, ED    EKG EKG Interpretation  Date/Time:  Saturday February 22 2018 10:58:01 EST Ventricular Rate:  120 PR Interval:    QRS Duration: 71 QT Interval:  289 QTC Calculation: 409 R Axis:   62 Text Interpretation:  Sinus tachycardia Confirmed by Lajean Saver [phone]) on 02/22/2018 12:41:40 PM   Radiology Ct Abdomen Pelvis W Contrast  Result Date: 02/22/2018 CLINICAL DATA:  Upper abdominal pain for 2 weeks. EXAM: CT ABDOMEN AND PELVIS WITH CONTRAST TECHNIQUE: Multidetector CT imaging of the abdomen and pelvis was performed using the standard protocol following bolus administration of intravenous contrast. CONTRAST:  11m OMNIPAQUE IOHEXOL 300 MG/ML  SOLN COMPARISON:  CT scan 09/17/2017 FINDINGS: Lower chest: The lung bases are clear except for areas of subpleural atelectasis. No worrisome pulmonary lesions. The heart is within normal limits in size. Age advanced coronary artery calcifications are noted. Hepatobiliary: No focal hepatic lesions or intrahepatic biliary dilatation. There is layering sludge and numerous small gallstones in the gallbladder. I do not see any definite CT findings for acute cholecystitis and there is no common bile duct dilatation. Pancreas: No mass, inflammation or ductal dilatation. Spleen: Normal size.  No focal lesions. Adrenals/Urinary Tract: The adrenal glands are normal. No renal, ureteral or bladder calculi or mass. Stomach/Bowel: Surgical changes from gastric bypass surgery. No complicating features are identified. The small bowel and colon are grossly normal without oral contrast. No obvious acute inflammatory changes, mass lesions or obstructive findings. Stable surgical changes involving the right colon from probable partial right hemicolectomy. Vascular/Lymphatic: Moderate atherosclerotic calcifications involving the aorta but no aneurysm or dissection. The branch vessels are patent. The major  venous structures are patent. Reproductive: The uterus is surgically absent. Both ovaries are still present and appear normal. Other: Surgical changes from anterior abdominal wall hernia repair with mesh. No findings for recurrent hernia. Musculoskeletal: No significant bony findings. IMPRESSION: 1. Cholelithiasis and layering sludge in the gallbladder but no definite CT findings for acute cholecystitis and no intra or extrahepatic biliary dilatation. 2. No renal, ureteral or bladder calculi or mass. 3. Surgical changes from gastric bypass surgery without complicating features. 4. Surgical changes from anterior abdominal wall hernia repair with mesh. No recurrent hernia. 5. Partial right hemicolectomy. Electronically Signed   By: PMarijo SanesM.D.   On: 02/22/2018 17:19   UKoreaAbdomen Limited Ruq  Result Date: 02/22/2018 CLINICAL DATA:  Right upper quadrant pain, abdominal pain EXAM: ULTRASOUND ABDOMEN LIMITED RIGHT UPPER QUADRANT COMPARISON:  None. FINDINGS: Gallbladder: Cholelithiasis. No pericholecystic fluid or gallbladder wall thickening. Largest gallstone measures 9 mm. No sonographic Murphy sign noted by sonographer. Common bile duct: Diameter: 7 mm Liver: 10 x 7 x 10 mm anechoic left hepatic mass most consistent with a cyst. Similar 8 x 8 x 12 mm anechoic left hepatic mass most consistent with a cyst. Normal hepatic echogenicity. Portal vein is patent on color Doppler imaging with normal direction of blood flow towards the liver. IMPRESSION: 1. Cholelithiasis without sonographic evidence of acute cholecystitis. Electronically Signed   By: HKathreen Devoid  On: 02/22/2018 12:45    Procedures Procedures (including critical care time)  Medications Ordered in ED Medications  cefTRIAXone (ROCEPHIN) 2 g in sodium chloride 0.9 % 100 mL IVPB (0 g Intravenous Stopped 02/22/18 1326)  metroNIDAZOLE (FLAGYL) IVPB 500 mg (500 mg Intravenous New Bag/Given  02/22/18 2048)  oxyCODONE (Oxy IR/ROXICODONE) immediate  release tablet 10 mg (10 mg Oral Given 02/22/18 2258)  hydrochlorothiazide (MICROZIDE) capsule 12.5 mg (has no administration in time range)  lisinopril (PRINIVIL,ZESTRIL) tablet 20 mg (has no administration in time range)  albuterol (PROVENTIL) (2.5 MG/3ML) 0.083% nebulizer solution 3 mL (has no administration in time range)  LORazepam (ATIVAN) tablet 1 mg (has no administration in time range)    Or  LORazepam (ATIVAN) injection 1 mg (has no administration in time range)  thiamine (VITAMIN B-1) tablet 100 mg (has no administration in time range)    Or  thiamine (B-1) injection 100 mg (has no administration in time range)  folic acid (FOLVITE) tablet 1 mg (has no administration in time range)  multivitamin with minerals tablet 1 tablet (has no administration in time range)  LORazepam (ATIVAN) injection 0-4 mg (0 mg Intravenous Not Given 02/22/18 2255)    Followed by  LORazepam (ATIVAN) injection 0-4 mg (has no administration in time range)  enoxaparin (LOVENOX) injection 40 mg (40 mg Subcutaneous Given 02/22/18 2302)  sodium chloride flush (NS) 0.9 % injection 3 mL (0 mLs Intravenous Duplicate 42/3/53 6144)  0.9 % NaCl with KCl 20 mEq/ L  infusion ( Intravenous New Bag/Given 02/23/18 0238)  sodium chloride flush (NS) 0.9 % injection 3 mL (3 mLs Intravenous Given 02/22/18 2301)  sodium chloride flush (NS) 0.9 % injection 3 mL (has no administration in time range)  0.9 %  sodium chloride infusion (has no administration in time range)  acetaminophen (TYLENOL) tablet 650 mg (650 mg Oral Given 02/23/18 0231)    Or  acetaminophen (TYLENOL) suppository 650 mg ( Rectal See Alternative 02/23/18 0231)  oxyCODONE (Oxy IR/ROXICODONE) immediate release tablet 5 mg (has no administration in time range)  zolpidem (AMBIEN) tablet 5 mg (has no administration in time range)  ondansetron (ZOFRAN) tablet 4 mg (has no administration in time range)    Or  ondansetron (ZOFRAN) injection 4 mg (has no administration in  time range)  insulin aspart (novoLOG) injection 0-20 Units (has no administration in time range)  insulin aspart (novoLOG) injection 0-5 Units (0 Units Subcutaneous Not Given 02/22/18 2250)  gabapentin (NEURONTIN) capsule 300 mg (has no administration in time range)  gabapentin (NEURONTIN) capsule 600 mg (has no administration in time range)  gabapentin (NEURONTIN) capsule 600 mg (600 mg Oral Given 02/22/18 2259)  oxyCODONE (OXYCONTIN) 12 hr tablet 10 mg (10 mg Oral Given 02/22/18 2259)  HYDROmorphone (DILAUDID) injection 1 mg (1 mg Intravenous Given 02/23/18 0553)  guaiFENesin-dextromethorphan (ROBITUSSIN DM) 100-10 MG/5ML syrup 5 mL (5 mLs Oral Given 02/22/18 2350)  morphine 4 MG/ML injection 4 mg (4 mg Intravenous Given 02/22/18 1145)  acetaminophen (TYLENOL) tablet 650 mg (650 mg Oral Given 02/22/18 1142)  sodium chloride 0.9 % bolus 1,000 mL (0 mLs Intravenous Stopped 02/22/18 1326)  sodium chloride 0.9 % bolus 800 mL (0 mLs Intravenous Stopped 02/22/18 1442)  morphine 4 MG/ML injection 4 mg (4 mg Intravenous Given 02/22/18 1509)  iohexol (OMNIPAQUE) 300 MG/ML solution 100 mL (100 mLs Intravenous Contrast Given 02/22/18 1659)     Initial Impression / Assessment and Plan / ED Course  I have reviewed the triage vital signs and the nursing notes.  Pertinent labs & imaging results that were available during my care of the patient were reviewed by me and considered in my medical decision making (see chart for details).     Final Clinical Impressions(s) / ED Diagnoses   Final diagnoses:  RUQ abdominal pain  Acute pancreatitis, unspecified complication status, unspecified pancreatitis type   Pt c/o 2 week h/o epigastric and RUQ abd pain associated with nausea and decreased appetite. H/o gastric bypass.   Vitals with fever (101.65F) and tachycardia, otherwise WNL. Pt meets sepsis criteria based on vital signs.  Labs, IVF, and abd abx ordered to cover intraabdominal infection.   CBC with no  leukocytosis. Mild anemia.  CMP with elevated creatinine, elevated AST/ALT at 573/153, elevated alk phos 152, elevated total bilirubin 2.6  Lipase elevated at 739 Lactic acid negative.  UA negative  RUQ [name] ordered given concern for gall bladder pathology based on abd exam and h/o gallstones seen on CT renal scan 09/2017 based on epic review. Ultrasound shows cholelithiasis without evidence of cholecytitis.  Given elevated liver enzymes, elevated total bilirubin and elevated lipase in setting of cholelithiasis, I suspect gallstone pancreatitis.   CONSULT with gastroenterology Dr. Oletta Lamas with [facility] who will consult on the patient. They suspect this could be due to alcoholic pancreatitis based on history. Reccommended admission to medicine.  CONSULT with hospitalist service, Dr. Evangeline Gula, who accepts patient for admission.   ED Discharge Orders    None       Bishop Dublin 02/23/18 0604    Lajean Saver, MD 02/23/18 [phone]

## 2018-02-22 NOTE — ED Notes (Signed)
Patient transported to Ultrasound 

## 2018-02-22 NOTE — ED Notes (Signed)
Pt ambulatory to RR w steady gait

## 2018-02-23 ENCOUNTER — Other Ambulatory Visit: Payer: Self-pay

## 2018-02-23 DIAGNOSIS — K859 Acute pancreatitis without necrosis or infection, unspecified: Secondary | ICD-10-CM

## 2018-02-23 DIAGNOSIS — G894 Chronic pain syndrome: Secondary | ICD-10-CM

## 2018-02-23 DIAGNOSIS — I10 Essential (primary) hypertension: Secondary | ICD-10-CM

## 2018-02-23 LAB — BLOOD CULTURE ID PANEL (REFLEXED)

## 2018-02-23 LAB — GLUCOSE, CAPILLARY
Glucose-Capillary: 105 mg/dL — ABNORMAL HIGH (ref 70–99)
Glucose-Capillary: 117 mg/dL — ABNORMAL HIGH (ref 70–99)
Glucose-Capillary: 92 mg/dL (ref 70–99)

## 2018-02-23 LAB — COMPREHENSIVE METABOLIC PANEL
ALT: 116 U/L — ABNORMAL HIGH (ref 0–44)
AST: 182 U/L — ABNORMAL HIGH (ref 15–41)
Albumin: 2.6 g/dL — ABNORMAL LOW (ref 3.5–5.0)
Alkaline Phosphatase: 138 U/L — ABNORMAL HIGH (ref 38–126)
Anion gap: 8 (ref 5–15)
BUN: 18 mg/dL (ref 6–20)
CO2: 22 mmol/L (ref 22–32)
Calcium: 8.5 mg/dL — ABNORMAL LOW (ref 8.9–10.3)
Chloride: 111 mmol/L (ref 98–111)
Creatinine, Ser: 1.22 mg/dL — ABNORMAL HIGH (ref 0.44–1.00)
GFR calc Af Amer: 57 mL/min — ABNORMAL LOW (ref >60)
GFR calc non Af Amer: 49 mL/min — ABNORMAL LOW (ref >60)
Glucose, Bld: 96 mg/dL (ref 70–99)
Potassium: 4.6 mmol/L (ref 3.5–5.1)
Sodium: 141 mmol/L (ref 135–145)
Total Bilirubin: 4.6 mg/dL — ABNORMAL HIGH (ref 0.3–1.2)
Total Protein: 6 g/dL — ABNORMAL LOW (ref 6.5–8.1)

## 2018-02-23 LAB — CBC
HCT: 33.3 % — ABNORMAL LOW (ref 36.0–46.0)
Hemoglobin: 10.2 g/dL — ABNORMAL LOW (ref 12.0–15.0)
MCH: 30.9 pg (ref 26.0–34.0)
MCHC: 30.6 g/dL (ref 30.0–36.0)
MCV: 100.9 fL — ABNORMAL HIGH (ref 80.0–100.0)
Platelets: 128 10*3/uL — ABNORMAL LOW (ref 150–400)
RBC: 3.3 MIL/uL — ABNORMAL LOW (ref 3.87–5.11)
RDW: 13.8 % (ref 11.5–15.5)
WBC: 6.7 10*3/uL (ref 4.0–10.5)
nRBC: 0 % (ref 0.0–0.2)

## 2018-02-23 LAB — HIV ANTIBODY (ROUTINE TESTING W REFLEX): HIV Screen 4th Generation wRfx: NONREACTIVE

## 2018-02-23 MED ORDER — SIMETHICONE 80 MG PO CHEW
80.0000 mg | CHEWABLE_TABLET | Freq: Two times a day (BID) | ORAL | Status: DC | PRN
  Administered 2018-02-24: 80 mg via ORAL
  Filled 2018-02-23: qty 1

## 2018-02-23 MED ORDER — BOOST / RESOURCE BREEZE PO LIQD CUSTOM
1.0000 | Freq: Three times a day (TID) | ORAL | Status: DC
  Administered 2018-02-23 (×2): 1 via ORAL

## 2018-02-23 NOTE — Progress Notes (Addendum)
Reported to oncoming RN regarding this need for new IV. Upon am assessment I put in a IV consult in at 0747 and again at 0945. I then notified charge nurse. Next Abx were scheduled at 1100.

## 2018-02-23 NOTE — Plan of Care (Signed)
  Problem: Skin Integrity: Goal: Risk for impaired skin integrity will decrease Outcome: Completed/Met

## 2018-02-23 NOTE — Progress Notes (Signed)
PROGRESS NOTE    Misty [name]  UEK:[id number] DOB: 1961/02/20 DOA: 02/22/2018 PCP: [name], MD    Brief Narrative:  57 y.o. female with medical history significant of chronic back pain, colon cancer, gastroesophageal reflux disease, hypertension, MS, gastric bypass who presents emergency department for complaint of epigastric and right upper abdominal pain.  Pain is been present intermittently for 2 weeks it is constant and severe in nature.  It is associated with nausea and decreased appetite.  Has had sweats and chills for the last week but has had no vomiting she reports chronic diarrhea since being diagnosed with her colon cancer which is unchanged.  Is had no bloody stools or hematemesis has not eaten for the last several days.  Pain does not radiate it is epigastric in nature but does not go to the back it is sharp.  Seen at urgent care a week ago had a normal x-ray and was given Zofran.  CT scan done at [facility] but results are not yet available.  In the past she has been told that it is her hernia that is causing the pain.  Of note the patient's son died in 2023-01-05 and since then she has been drinking fairly heavily.  She is drinking about 1/2 pint of liquor every day.  Assessment & Plan:   Principal Problem:   Pancreatitis Active Problems:   OBESITY   HYPERTENSION, BENIGN ESSENTIAL   Irritable bowel syndrome   Sleep difficulties   Chronic pain syndrome   Heavy alcohol use   Cholelithiasis   1.  Pancreatitis: -GI following -Likely ETOH pancreatitis per GI -Diet started per GI. Tolerating clears. Advance diet as tolerated  2.  Heavy alcohol use: -Reported large quantities of ETOH noted -Cessation done at bedside -Continue on CIWA  3.  Cholelithiasis:  -Biliary sludge on imaging noted -Appears stable.  4.  Hypertension, benign essential:  -Vitals reviewed. Stable -Continue home medication.  5.  Sleep difficulties:  -Patient states  she is having a hard time sleeping at night I have started some sleep medication while here in the hospital.  She will probably benefit from some therapy and behavioral management. -Presently stable  6.  Chronic pain syndrome:  -Patient takes long-acting narcotics at home for chronic pain we will continue these medications at discharge -Currently stable  7.  Obesity: Weight loss is encouraged.  8.  Irritable bowel syndrome: Likely this was diagnosed after her colon cancer she has diarrhea frequently.  There has been no change in her stool patterns. Continue supportive care   DVT prophylaxis: Lovenox subQ Code Status: Full Family Communication: Pt in room, family not at bedside Disposition Plan: Uncertain at this time  Consultants:   GI   Procedures:     Antimicrobials: Anti-infectives (From admission, onward)   Start     Dose/Rate Route Frequency Ordered Stop   02/22/18 1100  cefTRIAXone (ROCEPHIN) 2 g in sodium chloride 0.9 % 100 mL IVPB     2 g 200 mL/hr over 30 Minutes Intravenous Every 24 hours 02/22/18 1054     02/22/18 1100  metroNIDAZOLE (FLAGYL) IVPB 500 mg     500 mg 100 mL/hr over 60 Minutes Intravenous Every 8 hours 02/22/18 1054         Subjective: Tolerating clears, back pain  Objective: Vitals:   02/23/18 0425 02/23/18 0430 02/23/18 0756 02/23/18 1205  BP:  132/80 113/63 124/80  Pulse:  (!) 113 99 (!) 104  Resp:  14  20 20  Temp:  99.2 F (37.3 C) 98.8 F (37.1 C) 98.7 F (37.1 C)  TempSrc:  Oral Oral Oral  SpO2:  93% 95% 92%  Weight: 112.9 kg     Height:        Intake/Output Summary (Last 24 hours) at 02/23/2018 1601 Last data filed at 02/23/2018 1323 Gross per 24 hour  Intake 1710.89 ml  Output 1851 ml  Net -140.11 ml   Filed Weights   02/22/18 1058 02/23/18 0425  Weight: 108 kg 112.9 kg    Examination:  General exam: Appears calm and comfortable  Respiratory system: Clear to auscultation. Respiratory effort  normal. Cardiovascular system: S1 & S2 heard, RRR Gastrointestinal system: Abdomen is nondistended, soft and nontender. No organomegaly or masses felt. Normal bowel sounds heard. Central nervous system: Alert and oriented. No focal neurological deficits. Extremities: Symmetric 5 x 5 power. Skin: No rashes, lesions, janudiced Psychiatry: Judgement and insight appear normal. Mood & affect appropriate.   Data Reviewed: I have personally reviewed following labs and imaging studies  CBC: Recent Labs  Lab 02/22/18 1115 02/22/18 1734 02/23/18 0523  WBC 9.1 8.2 6.7  NEUTROABS 8.3*  --   --   HGB 11.4* 11.3* 10.2*  HCT 38.0 37.5 33.3*  MCV 102.2* 102.5* 100.9*  PLT 177 137* 884*   Basic Metabolic Panel: Recent Labs  Lab 02/22/18 1115 02/22/18 1734 02/23/18 0523  NA 139  --  141  K 4.8  --  4.6  CL 107  --  111  CO2 22  --  22  GLUCOSE 126*  --  96  BUN 23*  --  18  CREATININE 1.30* 1.20* 1.22*  CALCIUM 9.5  --  8.5*   GFR: Estimated Creatinine Clearance: 65.6 mL/min (A) (by C-G formula based on SCr of 1.22 mg/dL (H)). Liver Function Tests: Recent Labs  Lab 02/22/18 1115 02/23/18 0523  AST 573* 182*  ALT 153* 116*  ALKPHOS 152* 138*  BILITOT 2.6* 4.6*  PROT 6.9 6.0*  ALBUMIN 3.4* 2.6*   Recent Labs  Lab 02/22/18 1115  LIPASE 739*   No results for input(s): AMMONIA in the last 168 hours. Coagulation Profile: No results for input(s): INR, PROTIME in the last 168 hours. Cardiac Enzymes: No results for input(s): CKTOTAL, CKMB, CKMBINDEX, TROPONINI in the last 168 hours. BNP (last 3 results) No results for input(s): PROBNP in the last 8760 hours. HbA1C: Recent Labs    02/22/18 1734  HGBA1C 5.1   CBG: Recent Labs  Lab 02/22/18 2124 02/23/18 1309  GLUCAP 97 105*   Lipid Profile: No results for input(s): CHOL, HDL, LDLCALC, TRIG, CHOLHDL, LDLDIRECT in the last 72 hours. Thyroid Function Tests: No results for input(s): TSH, T4TOTAL, FREET4, T3FREE,  THYROIDAB in the last 72 hours. Anemia Panel: No results for input(s): VITAMINB12, FOLATE, FERRITIN, TIBC, IRON, RETICCTPCT in the last 72 hours. Sepsis Labs: Recent Labs  Lab 02/22/18 1124 02/22/18 1309  LATICACIDVEN 1.87 1.39    Recent Results (from the past 240 hour(s))  Blood Culture (routine x 2)     Status: None (Preliminary result)   Collection Time: 02/22/18 10:52 AM  Result Value Ref Range Status   Specimen Description BLOOD LEFT ANTECUBITAL  Final   Special Requests   Final    BOTTLES DRAWN AEROBIC AND ANAEROBIC Blood Culture results may not be optimal due to an inadequate volume of blood received in culture bottles   Culture   Final    NO GROWTH 1 DAY  Performed at [facility], [place] [address] [place], [place] [address]    Report Status PENDING  Incomplete  Blood Culture (routine x 2)     Status: None (Preliminary result)   Collection Time: 02/22/18 12:58 PM  Result Value Ref Range Status   Specimen Description BLOOD SITE NOT SPECIFIED  Final   Special Requests   Final    BOTTLES DRAWN AEROBIC AND ANAEROBIC Blood Culture results may not be optimal due to an inadequate volume of blood received in culture bottles   Culture   Final    NO GROWTH 1 DAY Performed at [facility], [place] [address], [place], [place] [address]    Report Status PENDING  Incomplete     Radiology Studies: Ct Abdomen Pelvis W Contrast  Result Date: 02/22/2018 CLINICAL DATA:  Upper abdominal pain for 2 weeks. EXAM: CT ABDOMEN AND PELVIS WITH CONTRAST TECHNIQUE: Multidetector CT imaging of the abdomen and pelvis was performed using the standard protocol following bolus administration of intravenous contrast. CONTRAST:  19mL OMNIPAQUE IOHEXOL 300 MG/ML  SOLN COMPARISON:  CT scan 09/17/2017 FINDINGS: Lower chest: The lung bases are clear except for areas of subpleural atelectasis. No worrisome pulmonary lesions. The heart is within normal limits in size. Age advanced coronary artery  calcifications are noted. Hepatobiliary: No focal hepatic lesions or intrahepatic biliary dilatation. There is layering sludge and numerous small gallstones in the gallbladder. I do not see any definite CT findings for acute cholecystitis and there is no common bile duct dilatation. Pancreas: No mass, inflammation or ductal dilatation. Spleen: Normal size.  No focal lesions. Adrenals/Urinary Tract: The adrenal glands are normal. No renal, ureteral or bladder calculi or mass. Stomach/Bowel: Surgical changes from gastric bypass surgery. No complicating features are identified. The small bowel and colon are grossly normal without oral contrast. No obvious acute inflammatory changes, mass lesions or obstructive findings. Stable surgical changes involving the right colon from probable partial right hemicolectomy. Vascular/Lymphatic: Moderate atherosclerotic calcifications involving the aorta but no aneurysm or dissection. The branch vessels are patent. The major venous structures are patent. Reproductive: The uterus is surgically absent. Both ovaries are still present and appear normal. Other: Surgical changes from anterior abdominal wall hernia repair with mesh. No findings for recurrent hernia. Musculoskeletal: No significant bony findings. IMPRESSION: 1. Cholelithiasis and layering sludge in the gallbladder but no definite CT findings for acute cholecystitis and no intra or extrahepatic biliary dilatation. 2. No renal, ureteral or bladder calculi or mass. 3. Surgical changes from gastric bypass surgery without complicating features. 4. Surgical changes from anterior abdominal wall hernia repair with mesh. No recurrent hernia. 5. Partial right hemicolectomy. Electronically Signed   By: Marijo Sanes M.D.   On: 02/22/2018 17:19   US Abdomen Limited Ruq  Result Date: 02/22/2018 CLINICAL DATA:  Right upper quadrant pain, abdominal pain EXAM: ULTRASOUND ABDOMEN LIMITED RIGHT UPPER QUADRANT COMPARISON:  None. FINDINGS:  Gallbladder: Cholelithiasis. No pericholecystic fluid or gallbladder wall thickening. Largest gallstone measures 9 mm. No sonographic Murphy sign noted by sonographer. Common bile duct: Diameter: 7 mm Liver: 10 x 7 x 10 mm anechoic left hepatic mass most consistent with a cyst. Similar 8 x 8 x 12 mm anechoic left hepatic mass most consistent with a cyst. Normal hepatic echogenicity. Portal vein is patent on color Doppler imaging with normal direction of blood flow towards the liver. IMPRESSION: 1. Cholelithiasis without sonographic evidence of acute cholecystitis. Electronically Signed   By: Kathreen Devoid  On: 02/22/2018 12:45    Scheduled Meds: . enoxaparin (LOVENOX) injection  40 mg Subcutaneous Q24H  . feeding supplement  1 Container Oral TID BM  . folic acid  1 mg Oral Daily  . gabapentin  300 mg Oral BH-q7a  . gabapentin  600 mg Oral QPM  . gabapentin  600 mg Oral QHS  . hydrochlorothiazide  12.5 mg Oral Daily  . insulin aspart  0-20 Units Subcutaneous TID WC  . insulin aspart  0-5 Units Subcutaneous QHS  . lisinopril  20 mg Oral Daily  . LORazepam  0-4 mg Intravenous Q6H   Followed by  . [START ON 02/24/2018] LORazepam  0-4 mg Intravenous Q12H  . multivitamin with minerals  1 tablet Oral Daily  . oxyCODONE  10 mg Oral 5 X Daily  . oxyCODONE  10 mg Oral Q12H  . sodium chloride flush  3 mL Intravenous Q12H  . sodium chloride flush  3 mL Intravenous Q12H  . thiamine  100 mg Oral Daily   Or  . thiamine  100 mg Intravenous Daily   Continuous Infusions: . sodium chloride 250 mL (02/23/18 1523)  . 0.9 % NaCl with KCl 20 mEq / L Stopped (02/23/18 1030)  . cefTRIAXone (ROCEPHIN)  IV 2 g (02/23/18 1525)  . metronidazole 500 mg (02/23/18 5732)     LOS: 1 day   Marylu Lund, MD [facility] Pager On Amion  If 7PM-7AM, please contact night-coverage 02/23/2018, 4:01 PM

## 2018-02-23 NOTE — Progress Notes (Signed)
Per CCMD pt had a run a VTACH, upon call pt assessed, denies complaints was actually sleeping, dreaming of her deceased son when the tele read [facility].

## 2018-02-23 NOTE — Progress Notes (Signed)
PHARMACY - PHYSICIAN COMMUNICATION CRITICAL VALUE ALERT - BLOOD CULTURE IDENTIFICATION (BCID)  [name] is an 57 y.o. female who presented to [facility] on 02/22/2018 with a chief complaint of epigastric and R upper abd pain  Assessment:  19 yof with pancreatitis, currently on ceftriaxone/flagyl for intra-abdominal infection  Name of physician (or Provider) ContactedWyline Copas  Current antibiotics: Ceftriaxone 2g IV q24h + Flagyl 500mg  IV q8h  Changes to prescribed antibiotics recommended:  Patient is on recommended antibiotics - No changes needed  Results for orders placed or performed during the hospital encounter of 02/22/18  Blood Culture ID Panel (Reflexed) (Collected: 02/22/2018 10:52 AM)  Result Value Ref Range   Enterococcus species NOT DETECTED NOT DETECTED   Listeria monocytogenes NOT DETECTED NOT DETECTED   Staphylococcus species NOT DETECTED NOT DETECTED   Staphylococcus aureus (BCID) NOT DETECTED NOT DETECTED   Streptococcus species NOT DETECTED NOT DETECTED   Streptococcus agalactiae NOT DETECTED NOT DETECTED   Streptococcus pneumoniae NOT DETECTED NOT DETECTED   Streptococcus pyogenes NOT DETECTED NOT DETECTED   Acinetobacter baumannii NOT DETECTED NOT DETECTED   Enterobacteriaceae species DETECTED (A) NOT DETECTED   Enterobacter cloacae complex NOT DETECTED NOT DETECTED   Escherichia coli DETECTED (A) NOT DETECTED   Klebsiella oxytoca NOT DETECTED NOT DETECTED   Klebsiella pneumoniae NOT DETECTED NOT DETECTED   Proteus species NOT DETECTED NOT DETECTED   Serratia marcescens NOT DETECTED NOT DETECTED   Carbapenem resistance NOT DETECTED NOT DETECTED   Haemophilus influenzae NOT DETECTED NOT DETECTED   Neisseria meningitidis NOT DETECTED NOT DETECTED   Pseudomonas aeruginosa NOT DETECTED NOT DETECTED   Candida albicans NOT DETECTED NOT DETECTED   Candida glabrata NOT DETECTED NOT DETECTED   Candida krusei NOT DETECTED NOT DETECTED   Candida parapsilosis NOT  DETECTED NOT DETECTED   Candida tropicalis NOT DETECTED NOT DETECTED    [name] 02/23/2018  6:54 PM

## 2018-02-23 NOTE — Progress Notes (Signed)
Report received on patient at 1115, pending IV team order in place upon receipt of report. Will give am abx once IV access is obtained.

## 2018-02-23 NOTE — Progress Notes (Signed)
[facility] PROGRESS NOTE Subjective Feeling better no vomiting still on clear liquids.  Daughter in the room who is RN here at the hospital  Objective: Vital signs in last 24 hours: Temp:  [98.8 F (37.1 C)-101.3 F (38.5 C)] 99.2 F (37.3 C) (12/08 0430) Pulse Rate:  [92-125] 99 (12/08 0756) Resp:  [14-24] 20 (12/08 0756) BP: (100-140)/(62-97) 113/63 (12/08 0756) SpO2:  [92 %-100 %] 95 % (12/08 0756) Weight:  [108 kg-112.9 kg] 112.9 kg (12/08 0425) Last BM Date: 02/23/18  Intake/Output from previous day: 12/07 0701 - 12/08 0700 In: 3397.7 [I.V.:1310.9; IV Piggyback:2086.9] Out: 1201 [Urine:1200; Stool:1] Intake/Output this shift: Total I/O In: 60 [P.O.:60] Out: 300 [Urine:300]  PE:  Abdomen--minimal tender  Lab Results: Recent Labs    02/22/18 1115 02/22/18 1734 02/23/18 0523  WBC 9.1 8.2 6.7  HGB 11.4* 11.3* 10.2*  HCT 38.0 37.5 33.3*  PLT 177 137* 128*   BMET Recent Labs    02/22/18 1115 02/22/18 1734 02/23/18 0523  NA 139  --  141  K 4.8  --  4.6  CL 107  --  111  CO2 22  --  22  CREATININE 1.30* 1.20* 1.22*   LFT Recent Labs    02/22/18 1115 02/23/18 0523  PROT 6.9 6.0*  AST 573* 182*  ALT 153* 116*  ALKPHOS 152* 138*  BILITOT 2.6* 4.6*   PT/INR No results for input(s): LABPROT, INR in the last 72 hours. PANCREAS Recent Labs    02/22/18 1115  LIPASE 739*         Studies/Results: Ct Abdomen Pelvis W Contrast  Result Date: 02/22/2018 CLINICAL DATA:  Upper abdominal pain for 2 weeks. EXAM: CT ABDOMEN AND PELVIS WITH CONTRAST TECHNIQUE: Multidetector CT imaging of the abdomen and pelvis was performed using the standard protocol following bolus administration of intravenous contrast. CONTRAST:  131mL OMNIPAQUE IOHEXOL 300 MG/ML  SOLN COMPARISON:  CT scan 09/17/2017 FINDINGS: Lower chest: The lung bases are clear except for areas of subpleural atelectasis. No worrisome pulmonary lesions. The heart is within normal limits in  size. Age advanced coronary artery calcifications are noted. Hepatobiliary: No focal hepatic lesions or intrahepatic biliary dilatation. There is layering sludge and numerous small gallstones in the gallbladder. I do not see any definite CT findings for acute cholecystitis and there is no common bile duct dilatation. Pancreas: No mass, inflammation or ductal dilatation. Spleen: Normal size.  No focal lesions. Adrenals/Urinary Tract: The adrenal glands are normal. No renal, ureteral or bladder calculi or mass. Stomach/Bowel: Surgical changes from gastric bypass surgery. No complicating features are identified. The small bowel and colon are grossly normal without oral contrast. No obvious acute inflammatory changes, mass lesions or obstructive findings. Stable surgical changes involving the right colon from probable partial right hemicolectomy. Vascular/Lymphatic: Moderate atherosclerotic calcifications involving the aorta but no aneurysm or dissection. The branch vessels are patent. The major venous structures are patent. Reproductive: The uterus is surgically absent. Both ovaries are still present and appear normal. Other: Surgical changes from anterior abdominal wall hernia repair with mesh. No findings for recurrent hernia. Musculoskeletal: No significant bony findings. IMPRESSION: 1. Cholelithiasis and layering sludge in the gallbladder but no definite CT findings for acute cholecystitis and no intra or extrahepatic biliary dilatation. 2. No renal, ureteral or bladder calculi or mass. 3. Surgical changes from gastric bypass surgery without complicating features. 4. Surgical changes from anterior abdominal wall hernia repair with mesh. No recurrent hernia. 5. Partial right hemicolectomy. Electronically Signed  By: Marijo Sanes M.D.   On: 02/22/2018 17:19   US Abdomen Limited Ruq  Result Date: 02/22/2018 CLINICAL DATA:  Right upper quadrant pain, abdominal pain EXAM: ULTRASOUND ABDOMEN LIMITED RIGHT UPPER  QUADRANT COMPARISON:  None. FINDINGS: Gallbladder: Cholelithiasis. No pericholecystic fluid or gallbladder wall thickening. Largest gallstone measures 9 mm. No sonographic Murphy sign noted by sonographer. Common bile duct: Diameter: 7 mm Liver: 10 x 7 x 10 mm anechoic left hepatic mass most consistent with a cyst. Similar 8 x 8 x 12 mm anechoic left hepatic mass most consistent with a cyst. Normal hepatic echogenicity. Portal vein is patent on color Doppler imaging with normal direction of blood flow towards the liver. IMPRESSION: 1. Cholelithiasis without sonographic evidence of acute cholecystitis. Electronically Signed   By: Kathreen Devoid   On: 02/22/2018 12:45    Medications: I have reviewed the patient's current medications.  Assessment:   1.  Acute pancreatitis.  CT does not show any worrisome signs at all and she is improving.  Have discussed with patient and daughter in spite of the gallbladder sludge and gallstones her heavy drinking is likely contributing if not the main cause.   Plan: We will go ahead and start her on fat-free clear liquids and if she tolerates this hopefully she could be discharged in the next day or so and follow-up in the office with Dr. Paulita Fujita her primary gastroenterologist.   [name] 02/23/2018, 9:46 AM  This note was created using voice recognition software. Minor errors may Have occurred unintentionally.  Pager: [phone] If no answer or after hours call [phone]

## 2018-02-24 LAB — COMPREHENSIVE METABOLIC PANEL
ALT: 83 U/L — ABNORMAL HIGH (ref 0–44)
AST: 77 U/L — ABNORMAL HIGH (ref 15–41)
Albumin: 2.6 g/dL — ABNORMAL LOW (ref 3.5–5.0)
Alkaline Phosphatase: 140 U/L — ABNORMAL HIGH (ref 38–126)
Anion gap: 11 (ref 5–15)
BUN: 14 mg/dL (ref 6–20)
CO2: 21 mmol/L — ABNORMAL LOW (ref 22–32)
Calcium: 8.7 mg/dL — ABNORMAL LOW (ref 8.9–10.3)
Chloride: 105 mmol/L (ref 98–111)
Creatinine, Ser: 1.15 mg/dL — ABNORMAL HIGH (ref 0.44–1.00)
GFR calc Af Amer: >60 mL/min (ref >60)
GFR calc non Af Amer: 53 mL/min — ABNORMAL LOW (ref >60)
Glucose, Bld: 95 mg/dL (ref 70–99)
Potassium: 4.6 mmol/L (ref 3.5–5.1)
Sodium: 137 mmol/L (ref 135–145)
Total Bilirubin: 2.3 mg/dL — ABNORMAL HIGH (ref 0.3–1.2)
Total Protein: 6.4 g/dL — ABNORMAL LOW (ref 6.5–8.1)

## 2018-02-24 LAB — GLUCOSE, CAPILLARY
Glucose-Capillary: 93 mg/dL (ref 70–99)
Glucose-Capillary: 99 mg/dL (ref 70–99)
Glucose-Capillary: 103 mg/dL — ABNORMAL HIGH (ref 70–99)
Glucose-Capillary: 144 mg/dL — ABNORMAL HIGH (ref 70–99)

## 2018-02-24 LAB — LIPASE, BLOOD: Lipase: 42 U/L (ref 11–51)

## 2018-02-24 NOTE — Progress Notes (Signed)
Initial Nutrition Assessment  DOCUMENTATION CODES:   Morbid obesity  INTERVENTION:   -D/c Boost Breeze po TID, each supplement provides 250 kcal and 9 grams of protein, due to poor acceptance -Continue MVI with minerals daily -Snacks BID between meals  NUTRITION DIAGNOSIS:   Increased nutrient needs related to acute illness(pancreatitis) as evidenced by estimated needs.  GOAL:   Patient will meet greater than or equal to 90% of their needs  MONITOR:   PO intake, Labs, Weight trends, Skin, I & O's  REASON FOR ASSESSMENT:   Malnutrition Screening Tool    ASSESSMENT:   Misty [name] is a 57 y.o. female with medical history significant of chronic back pain, colon cancer, gastroesophageal reflux disease, hypertension, MS, gastric bypass who presents emergency department for complaint of epigastric and right upper abdominal pain.  Pain is been present intermittently for 2 weeks it is constant and severe in nature.  It is associated with nausea and decreased appetite.  Has had sweats and chills for the last week but has had no vomiting she reports chronic diarrhea since being diagnosed with her colon cancer which is unchanged.  Is had no bloody stools or hematemesis has not eaten for the last several days.  Pain does not radiate it is epigastric in nature but does not go to the back it is sharp.  Seen at urgent care a week ago had a normal x-ray and was given Zofran.  CT scan done at [facility] but results are not yet available.  In the past she has been told that it is her hernia that is causing the pain.  Pt admitted with pancreatitis.  Case discussed with RN prior to visit, who reports pt with good appetite- she was just advanced to a soft diet prior to visit. Pt does not like the Boost Breeze supplements, as she complains they give her gas.   Per H&P, pt has been drinking heavily since her son's death approximately 3 months ago (consumed a 1/2 pint of liquor daily).    Spoke with pt, who was ambulating around room at time of visit. Pt was very tangential during interview; very focused on changing her sheets on the bed and the functionality of the bed controls. She reports her appetite has been "off and on" over the past month. She shares that she consumed "nothing" over the past 2 weeks, however, later reported that her last meal consisted of 20% of a 3 ounce hamburger patty with a slice of tomato, two pickle chips and waffles fries. She also reports consuming potato salad and dressing on Thanksgiving. Pt complained of reflux and abdominal pain when eating PTA, however, this was resolved upon admission to the hospital. Observed breakfast meal tray- pt consumed 100% of meal tray.   Pt endorses wt loss, however, wt has been stable over the past 9 months.   Pt endorses dislike of Boost Breeze supplement. Offered other supplements on the formulary, however, pt refused as she reports she has tried them all in the past but does not tolerate well. Pt is excited to be consuming solid foods for lunch and is looking forward her her tomato soup and sandwich. Discussed importance of good meal intake to promote healing.   Labs reviewed: CBGS: 92-117 (inpatient orders for glycemic control are 0-10 units insulin aspart TID with meals and 0-5 units insulin aspart q HS).   NUTRITION - FOCUSED PHYSICAL EXAM:    Most Recent Value  Orbital Region  No depletion  Upper Arm Region  No depletion  Thoracic and Lumbar Region  No depletion  Buccal Region  No depletion  Temple Region  No depletion  Clavicle Bone Region  No depletion  Clavicle and Acromion Bone Region  No depletion  Scapular Bone Region  No depletion  Dorsal Hand  No depletion  Patellar Region  No depletion  Anterior Thigh Region  No depletion  Posterior Calf Region  No depletion  Edema (RD Assessment)  None  Hair  Reviewed  Eyes  Reviewed  Mouth  Reviewed  Skin  Reviewed  Nails  Reviewed       Diet Order:    Diet Order            DIET SOFT Room service appropriate? Yes; Fluid consistency: Thin  Diet effective now              EDUCATION NEEDS:   Education needs have been addressed  Skin:  Skin Assessment: Reviewed RN Assessment  Last BM:  02/23/18  Height:   Ht Readings from Last 1 Encounters:  02/22/18 5\' 6"  (1.676 m)    Weight:   Wt Readings from Last 1 Encounters:  02/24/18 113.5 kg    Ideal Body Weight:  59.1 kg  BMI:  Body mass index is 40.38 kg/m.  Estimated Nutritional Needs:   Kcal:  1850-2050  Protein:  90-105 grams  Fluid:  1.8-2.0 L    Aracelli Woloszyn A. Jimmye Norman, RD, LDN, CDE Pager: [phone] After hours Pager: [phone]

## 2018-02-24 NOTE — Progress Notes (Signed)
[facility] Progress Note  Misty [name] 57 y.o. [birth year]  CC: Acute pancreatitis   Subjective: She is feeling better from GI standpoint.  Abdominal pain has improved.  Denied nausea vomiting.  Wants to advance diet.  Having regular bowel movements.  Denies any blood in the stool.  ROS : Low-grade fever this morning.  Negative for chest pain and shortness of breath.   Objective: Vital signs in last 24 hours: Vitals:   02/24/18 0700 02/24/18 1032  BP: (!) 150/89 129/78  Pulse: (!) 103 95  Resp: 18   Temp: 100 F (37.8 C) 98.4 F (36.9 C)  SpO2: 96% 99%    Physical Exam:  General:  Alert, cooperative, no distress, appears stated age  Head:  Normocephalic, without obvious abnormality, atraumatic  Eyes:  , EOM's intact,   Lungs:   Clear to auscultation bilaterally, respirations unlabored  Heart:  Regular rate and rhythm, S1, S2 normal  Abdomen:   Soft, non-tender, bowel sounds present, no peritoneal sign, abdomen is soft  Extremities: Extremities normal, atraumatic, 1 + edema       Lab Results: Recent Labs    02/23/18 0523 02/24/18 0521  NA 141 137  K 4.6 4.6  CL 111 105  CO2 22 21*  GLUCOSE 96 95  BUN 18 14  CREATININE 1.22* 1.15*  CALCIUM 8.5* 8.7*   Recent Labs    02/23/18 0523 02/24/18 0521  AST 182* 77*  ALT 116* 83*  ALKPHOS 138* 140*  BILITOT 4.6* 2.3*  PROT 6.0* 6.4*  ALBUMIN 2.6* 2.6*   Recent Labs    02/22/18 1115 02/22/18 1734 02/23/18 0523  WBC 9.1 8.2 6.7  NEUTROABS 8.3*  --   --   HGB 11.4* 11.3* 10.2*  HCT 38.0 37.5 33.3*  MCV 102.2* 102.5* 100.9*  PLT 177 137* 128*   No results for input(s): LABPROT, INR in the last 72 hours.    Assessment/Plan: -Acute pancreatitis.  Most likely  alcohol use but gallstone pancreatitis cannot be ruled out..  Improving.  Ultrasound on February 22, 2018 showed cholelithiasis without acute cholecystitis.  Elevated lipase at 739 on admission.  CT scan showed normal-appearing  pancreas without biliary ductal dilatation.  Sludge in gallbladder was seen. -E. coli / Enterobacteriaceae  bacteremia. -Abnormal LFTs.  Trending down.  Recommendations ------------------------- -Patient's symptoms are improving clinically but she has E. coli / Enterobacteriaceae  Bacteremia. - Check HIDA scan -Advance diet to soft diet. -GI will follow.  Otis Brace MD, [place] 02/24/2018, 10:43 AM  Contact #  [phone]

## 2018-02-24 NOTE — Progress Notes (Signed)
PROGRESS NOTE    Misty [name]  BZJ:[id number] DOB: [birth year] DOA: 02/22/2018 PCP: [name], MD    Brief Narrative:  57 y.o. female with medical history significant of chronic back pain, colon cancer, gastroesophageal reflux disease, hypertension, MS, gastric bypass who presents emergency department for complaint of epigastric and right upper abdominal pain.  Pain is been present intermittently for 2 weeks it is constant and severe in nature.  It is associated with nausea and decreased appetite.  Has had sweats and chills for the last week but has had no vomiting she reports chronic diarrhea since being diagnosed with her colon cancer which is unchanged.  Is had no bloody stools or hematemesis has not eaten for the last several days.  Pain does not radiate it is epigastric in nature but does not go to the back it is sharp.  Seen at urgent care a week ago had a normal x-ray and was given Zofran.  CT scan done at [facility] but results are not yet available.  In the past she has been told that it is her hernia that is causing the pain.  Of note the patient's son died in January 01, 2023 and since then she has been drinking fairly heavily.  She is drinking about 1/2 pint of liquor every day.  Assessment & Plan:   Principal Problem:   Pancreatitis Active Problems:   OBESITY   HYPERTENSION, BENIGN ESSENTIAL   Irritable bowel syndrome   Sleep difficulties   Chronic pain syndrome   Heavy alcohol use   Cholelithiasis   1.  Pancreatitis: -GI following -Likely ETOH pancreatitis per GI -Diet started per GI. Tolerating clears. Successfully advanced diet  2.  Heavy alcohol use: -Reported large quantities of ETOH noted -Cessation done at bedside -Continue on CIWA. No active withdrawals at this time  3.  Cholelithiasis:  -Biliary sludge on imaging noted -Seems stable at this time  4.  Hypertension, benign essential:  -Vitals reviewed. Stable -Continue home  medication.  5.  Sleep difficulties:  -Patient states she is having a hard time sleeping at night I have started some sleep medication while here in the hospital.  She will probably benefit from some therapy and behavioral management. -Presently stable  6.  Chronic pain syndrome:  -Patient takes long-acting narcotics at home for chronic pain we will continue these medications at discharge -Currently stable  7.  Obesity: Weight loss is encouraged.  8.  Irritable bowel syndrome: Likely this was diagnosed after her colon cancer she has diarrhea frequently.  There has been no change in her stool patterns. Continue supportive care  9. Ecoli bacteremia without sepsis -febrile overnight -Continue rocephin, follow sensitivities -Will likely need 2 weeks total course of abx -HIDA ordered by GI, pending study  DVT prophylaxis: Lovenox subQ Code Status: Full Family Communication: Pt in room, family not at bedside Disposition Plan: Uncertain at this time  Consultants:   GI   Procedures:     Antimicrobials: Anti-infectives (From admission, onward)   Start     Dose/Rate Route Frequency Ordered Stop   02/22/18 1100  cefTRIAXone (ROCEPHIN) 2 g in sodium chloride 0.9 % 100 mL IVPB     2 g 200 mL/hr over 30 Minutes Intravenous Every 24 hours 02/22/18 1054     02/22/18 1100  metroNIDAZOLE (FLAGYL) IVPB 500 mg     500 mg 100 mL/hr over 60 Minutes Intravenous Every 8 hours 02/22/18 1054        Subjective: Tolerating clears,  continues with back pain  Objective: Vitals:   02/24/18 0100 02/24/18 0700 02/24/18 1032 02/24/18 1128  BP:  (!) 150/89 129/78 (!) 140/95  Pulse:  (!) 103 95 92  Resp:  18  17  Temp: 99.6 F (37.6 C) 100 F (37.8 C) 98.4 F (36.9 C) 98.2 F (36.8 C)  TempSrc: Oral Oral Oral Oral  SpO2:  96% 99% 98%  Weight:  113.5 kg    Height:        Intake/Output Summary (Last 24 hours) at 02/24/2018 1525 Last data filed at 02/24/2018 1425 Gross per 24 hour   Intake 1418.91 ml  Output 1420 ml  Net -1.09 ml   Filed Weights   02/22/18 1058 02/23/18 0425 02/24/18 0700  Weight: 108 kg 112.9 kg 113.5 kg    Examination: General exam: Awake, laying in bed, in nad Respiratory system: Normal respiratory effort, no wheezing Cardiovascular system: regular rate, s1, s2 Gastrointestinal system: Soft, nondistended, positive BS Central nervous system: CN2-12 grossly intact, strength intact Extremities: Perfused, no clubbing Skin: Normal skin turgor, no notable skin lesions seen Psychiatry: Mood normal // no visual hallucinations   Data Reviewed: I have personally reviewed following labs and imaging studies  CBC: Recent Labs  Lab 02/22/18 1115 02/22/18 1734 02/23/18 0523  WBC 9.1 8.2 6.7  NEUTROABS 8.3*  --   --   HGB 11.4* 11.3* 10.2*  HCT 38.0 37.5 33.3*  MCV 102.2* 102.5* 100.9*  PLT 177 137* 767*   Basic Metabolic Panel: Recent Labs  Lab 02/22/18 1115 02/22/18 1734 02/23/18 0523 02/24/18 0521  NA 139  --  141 137  K 4.8  --  4.6 4.6  CL 107  --  111 105  CO2 22  --  22 21*  GLUCOSE 126*  --  96 95  BUN 23*  --  18 14  CREATININE 1.30* 1.20* 1.22* 1.15*  CALCIUM 9.5  --  8.5* 8.7*   GFR: Estimated Creatinine Clearance: 69.8 mL/min (A) (by C-G formula based on SCr of 1.15 mg/dL (H)). Liver Function Tests: Recent Labs  Lab 02/22/18 1115 02/23/18 0523 02/24/18 0521  AST 573* 182* 77*  ALT 153* 116* 83*  ALKPHOS 152* 138* 140*  BILITOT 2.6* 4.6* 2.3*  PROT 6.9 6.0* 6.4*  ALBUMIN 3.4* 2.6* 2.6*   Recent Labs  Lab 02/22/18 1115 02/24/18 0521  LIPASE 739* 42   No results for input(s): AMMONIA in the last 168 hours. Coagulation Profile: No results for input(s): INR, PROTIME in the last 168 hours. Cardiac Enzymes: No results for input(s): CKTOTAL, CKMB, CKMBINDEX, TROPONINI in the last 168 hours. BNP (last 3 results) No results for input(s): PROBNP in the last 8760 hours. HbA1C: Recent Labs    02/22/18 1734   HGBA1C 5.1   CBG: Recent Labs  Lab 02/23/18 1309 02/23/18 1705 02/23/18 2100 02/24/18 0741 02/24/18 1126  GLUCAP 105* 117* 92 93 99   Lipid Profile: No results for input(s): CHOL, HDL, LDLCALC, TRIG, CHOLHDL, LDLDIRECT in the last 72 hours. Thyroid Function Tests: No results for input(s): TSH, T4TOTAL, FREET4, T3FREE, THYROIDAB in the last 72 hours. Anemia Panel: No results for input(s): VITAMINB12, FOLATE, FERRITIN, TIBC, IRON, RETICCTPCT in the last 72 hours. Sepsis Labs: Recent Labs  Lab 02/22/18 1124 02/22/18 1309  LATICACIDVEN 1.87 1.39    Recent Results (from the past 240 hour(s))  Blood Culture (routine x 2)     Status: Abnormal (Preliminary result)   Collection Time: 02/22/18 10:52 AM  Result Value Ref  Range Status   Specimen Description BLOOD LEFT ANTECUBITAL  Final   Special Requests   Final    BOTTLES DRAWN AEROBIC AND ANAEROBIC Blood Culture results may not be optimal due to an inadequate volume of blood received in culture bottles   Culture  Setup Time   Final    GRAM NEGATIVE RODS BLOOD AEROBIC BOTTLE CRITICAL RESULT CALLED TO, READ BACK BY AND VERIFIED WITH: PHARMD BAIRD, H [id number] FCP    Culture (A)  Final    ESCHERICHIA COLI SUSCEPTIBILITIES TO FOLLOW Performed at [facility], [place] [address], [place], [place] [address]    Report Status PENDING  Incomplete  Blood Culture ID Panel (Reflexed)     Status: Abnormal   Collection Time: 02/22/18 10:52 AM  Result Value Ref Range Status   Enterococcus species NOT DETECTED NOT DETECTED Final   Listeria monocytogenes NOT DETECTED NOT DETECTED Final   Staphylococcus species NOT DETECTED NOT DETECTED Final   Staphylococcus aureus (BCID) NOT DETECTED NOT DETECTED Final   Streptococcus species NOT DETECTED NOT DETECTED Final   Streptococcus agalactiae NOT DETECTED NOT DETECTED Final   Streptococcus pneumoniae NOT DETECTED NOT DETECTED Final   Streptococcus pyogenes NOT DETECTED NOT DETECTED Final    Acinetobacter baumannii NOT DETECTED NOT DETECTED Final   Enterobacteriaceae species DETECTED (A) NOT DETECTED Final    Comment: Enterobacteriaceae represent a large family of gram-negative bacteria, not a single organism. CRITICAL RESULT CALLED TO, READ BACK BY AND VERIFIED WITH: [place], H [id number] [id number] FCP    Enterobacter cloacae complex NOT DETECTED NOT DETECTED Final   Escherichia coli DETECTED (A) NOT DETECTED Final    Comment: CRITICAL RESULT CALLED TO, READ BACK BY AND VERIFIED WITH: PHARMD BAIRD, H [id number] [id number] FCP    Klebsiella oxytoca NOT DETECTED NOT DETECTED Final   Klebsiella pneumoniae NOT DETECTED NOT DETECTED Final   Proteus species NOT DETECTED NOT DETECTED Final   Serratia marcescens NOT DETECTED NOT DETECTED Final   Carbapenem resistance NOT DETECTED NOT DETECTED Final   Haemophilus influenzae NOT DETECTED NOT DETECTED Final   Neisseria meningitidis NOT DETECTED NOT DETECTED Final   Pseudomonas aeruginosa NOT DETECTED NOT DETECTED Final   Candida albicans NOT DETECTED NOT DETECTED Final   Candida glabrata NOT DETECTED NOT DETECTED Final   Candida krusei NOT DETECTED NOT DETECTED Final   Candida parapsilosis NOT DETECTED NOT DETECTED Final   Candida tropicalis NOT DETECTED NOT DETECTED Final    Comment: Performed at [facility], [place] [address], [place], [place] [address]  Blood Culture (routine x 2)     Status: None (Preliminary result)   Collection Time: 02/22/18 12:58 PM  Result Value Ref Range Status   Specimen Description BLOOD SITE NOT SPECIFIED  Final   Special Requests   Final    BOTTLES DRAWN AEROBIC AND ANAEROBIC Blood Culture results may not be optimal due to an inadequate volume of blood received in culture bottles   Culture   Final    NO GROWTH 2 DAYS Performed at [facility], [place] [address] [place],  [address]    Report Status PENDING  Incomplete     Radiology Studies: Ct Abdomen Pelvis W Contrast  Result Date:  02/22/2018 CLINICAL DATA:  Upper abdominal pain for 2 weeks. EXAM: CT ABDOMEN AND PELVIS WITH CONTRAST TECHNIQUE: Multidetector CT imaging of the abdomen and pelvis was performed using the standard protocol following bolus administration of intravenous contrast. CONTRAST:  154mL OMNIPAQUE IOHEXOL  300 MG/ML  SOLN COMPARISON:  CT scan 09/17/2017 FINDINGS: Lower chest: The lung bases are clear except for areas of subpleural atelectasis. No worrisome pulmonary lesions. The heart is within normal limits in size. Age advanced coronary artery calcifications are noted. Hepatobiliary: No focal hepatic lesions or intrahepatic biliary dilatation. There is layering sludge and numerous small gallstones in the gallbladder. I do not see any definite CT findings for acute cholecystitis and there is no common bile duct dilatation. Pancreas: No mass, inflammation or ductal dilatation. Spleen: Normal size.  No focal lesions. Adrenals/Urinary Tract: The adrenal glands are normal. No renal, ureteral or bladder calculi or mass. Stomach/Bowel: Surgical changes from gastric bypass surgery. No complicating features are identified. The small bowel and colon are grossly normal without oral contrast. No obvious acute inflammatory changes, mass lesions or obstructive findings. Stable surgical changes involving the right colon from probable partial right hemicolectomy. Vascular/Lymphatic: Moderate atherosclerotic calcifications involving the aorta but no aneurysm or dissection. The branch vessels are patent. The major venous structures are patent. Reproductive: The uterus is surgically absent. Both ovaries are still present and appear normal. Other: Surgical changes from anterior abdominal wall hernia repair with mesh. No findings for recurrent hernia. Musculoskeletal: No significant bony findings. IMPRESSION: 1. Cholelithiasis and layering sludge in the gallbladder but no definite CT findings for acute cholecystitis and no intra or extrahepatic  biliary dilatation. 2. No renal, ureteral or bladder calculi or mass. 3. Surgical changes from gastric bypass surgery without complicating features. 4. Surgical changes from anterior abdominal wall hernia repair with mesh. No recurrent hernia. 5. Partial right hemicolectomy. Electronically Signed   By: Marijo Sanes M.D.   On: 02/22/2018 17:19    Scheduled Meds: . enoxaparin (LOVENOX) injection  40 mg Subcutaneous Q24H  . folic acid  1 mg Oral Daily  . gabapentin  300 mg Oral [id number]  . gabapentin  600 mg Oral QPM  . gabapentin  600 mg Oral QHS  . hydrochlorothiazide  12.5 mg Oral Daily  . insulin aspart  0-20 Units Subcutaneous TID WC  . insulin aspart  0-5 Units Subcutaneous QHS  . lisinopril  20 mg Oral Daily  . LORazepam  0-4 mg Intravenous Q6H   Followed by  . LORazepam  0-4 mg Intravenous Q12H  . multivitamin with minerals  1 tablet Oral Daily  . oxyCODONE  10 mg Oral 5 X Daily  . oxyCODONE  10 mg Oral Q12H  . sodium chloride flush  3 mL Intravenous Q12H  . sodium chloride flush  3 mL Intravenous Q12H  . thiamine  100 mg Oral Daily   Or  . thiamine  100 mg Intravenous Daily   Continuous Infusions: . sodium chloride 250 mL (02/23/18 1523)  . cefTRIAXone (ROCEPHIN)  IV 2 g (02/24/18 1109)  . metronidazole 500 mg (02/24/18 1214)     LOS: 2 days   Marylu Lund, MD [facility] Pager On Amion  If 7PM-7AM, please contact night-coverage 02/24/2018, 3:25 PM

## 2018-02-24 NOTE — Progress Notes (Signed)
I received a Hadar to provide spiritual support for the patient. I visited the patient's room and listened with compassion as she talked about the death of her son, Jaci Standard, in 08-Jan-2023. She talked about the pressures she feels at home with trying to care for everyone's needs and feels exhausted herself. I provided spiritual support by sharing words of encouragement and comfort. I led in prayer and shared that the Chaplain is available for additional support as needed or requested.    02/24/18 1000  Clinical Encounter Type  Visited With Patient  Visit Type Spiritual support  Referral From Nurse  Consult/Referral To Chaplain  Spiritual Encounters  Spiritual Needs Grief support;Emotional;Prayer    Chaplain Dr Redgie Grayer

## 2018-02-25 ENCOUNTER — Inpatient Hospital Stay (HOSPITAL_COMMUNITY): Payer: Medicare Other

## 2018-02-25 LAB — COMPREHENSIVE METABOLIC PANEL
ALT: 56 U/L — ABNORMAL HIGH (ref 0–44)
AST: 36 U/L (ref 15–41)
Albumin: 2.5 g/dL — ABNORMAL LOW (ref 3.5–5.0)
Alkaline Phosphatase: 135 U/L — ABNORMAL HIGH (ref 38–126)
Anion gap: 9 (ref 5–15)
BUN: 11 mg/dL (ref 6–20)
CO2: 24 mmol/L (ref 22–32)
Calcium: 8.8 mg/dL — ABNORMAL LOW (ref 8.9–10.3)
Chloride: 106 mmol/L (ref 98–111)
Creatinine, Ser: 1.17 mg/dL — ABNORMAL HIGH (ref 0.44–1.00)
GFR calc Af Amer: >60 mL/min (ref >60)
GFR calc non Af Amer: 52 mL/min — ABNORMAL LOW (ref >60)
Glucose, Bld: 100 mg/dL — ABNORMAL HIGH (ref 70–99)
Potassium: 4.4 mmol/L (ref 3.5–5.1)
Sodium: 139 mmol/L (ref 135–145)
Total Bilirubin: 1.4 mg/dL — ABNORMAL HIGH (ref 0.3–1.2)
Total Protein: 5.9 g/dL — ABNORMAL LOW (ref 6.5–8.1)

## 2018-02-25 LAB — CULTURE, BLOOD (ROUTINE X 2)

## 2018-02-25 LAB — GLUCOSE, CAPILLARY
Glucose-Capillary: 97 mg/dL (ref 70–99)
Glucose-Capillary: 111 mg/dL — ABNORMAL HIGH (ref 70–99)

## 2018-02-25 MED ORDER — METRONIDAZOLE 500 MG PO TABS: 500.0000 mg | ORAL_TABLET | Freq: Three times a day (TID) | ORAL | 0 refills | Status: AC

## 2018-02-25 MED ORDER — METRONIDAZOLE 500 MG PO TABS
500.0000 mg | ORAL_TABLET | Freq: Three times a day (TID) | ORAL | Status: DC
  Administered 2018-02-25 – 2018-02-26 (×2): 500 mg via ORAL
  Filled 2018-02-25 (×2): qty 1

## 2018-02-25 MED ORDER — DICLOFENAC SODIUM 1 % TD GEL
2.0000 g | Freq: Four times a day (QID) | TRANSDERMAL | Status: DC | PRN
  Administered 2018-02-25 (×2): 2 g via TOPICAL
  Filled 2018-02-25: qty 100

## 2018-02-25 MED ORDER — TECHNETIUM TC 99M MEBROFENIN IV KIT
5.2000 | PACK | Freq: Once | INTRAVENOUS | Status: AC | PRN
  Administered 2018-02-25: 5.2 via INTRAVENOUS

## 2018-02-25 MED ORDER — CEFDINIR 300 MG PO CAPS: 300.0000 mg | ORAL_CAPSULE | Freq: Two times a day (BID) | ORAL | 0 refills | Status: AC

## 2018-02-25 MED FILL — metroNIDAZOLE 500 MG TABS: 500 | 10 days supply | Qty: 30 | Fill #0

## 2018-02-25 MED FILL — CEFDINIR 300 MG CAPSULE: 300 | 10 days supply | Qty: 20 | Fill #0

## 2018-02-25 NOTE — Discharge Summary (Signed)
Physician Discharge Summary  Misty [name] AVW:[id number] DOB: Jul 07, 1960 DOA: 02/22/2018  PCP: [name], MD  Admit date: 02/22/2018 Discharge date: 02/25/2018  Admitted From: Home Disposition:  Home  Recommendations for Outpatient Follow-up:  1. Follow up with PCP in 1-2 weeks 2. Follow up with GI in 2 weeks  Discharge Condition:Improved CODE STATUS:Full Diet recommendation: Low fat   Brief/Interim Summary: 57 y.o.femalewith medical history significant ofchronic back pain, colon cancer, gastroesophageal reflux disease, hypertension, MS, gastric bypass who presents emergency department for complaint of epigastric and right upper abdominal pain. Pain is been present intermittently for 2 weeks it is constant and severe in nature. It is associated with nausea and decreased appetite. Has had sweats and chills for the last week but has had no vomiting she reports chronic diarrhea since being diagnosed with her colon cancer which is unchanged. Is had no bloody stools or hematemesis has not eaten for the last several days. Pain does not radiate it is epigastric in nature but does not go to the back it is sharp. Seen at urgent care a week ago had a normal x-ray and was given Zofran. CT scan done at [facility] but results are not yet available. In the past she has been told that it is her hernia that is causing the pain.  Of note the patient's son died in 2023/01/04 and since then she has been drinking fairly heavily. She is drinking about 1/2 pint of liquor every day.  Discharge Diagnoses:  Principal Problem:   Pancreatitis Active Problems:   OBESITY   HYPERTENSION, BENIGN ESSENTIAL   Irritable bowel syndrome   Sleep difficulties   Chronic pain syndrome   Heavy alcohol use   Cholelithiasis  1. Pancreatitis: -GI following -Likely ETOH pancreatitis per GI -Diet started per GI. Tolerating clears. Successfully advanced diet  2. Heavy alcohol  use: -Reported large quantities of ETOH noted -Cessation done at bedside -Continue on CIWA. No active withdrawals at this time  3. Cholelithiasis:  -Biliary sludge on imaging noted -Seems stable at this time -HIDA recommended by GI, however pt did not tolerate -Discussed with GI, recommendation for MRCP which could be done as outpt per GI. Pt eager to go home   4. Hypertension, benign essential:  -Vitals reviewed. Stable -Continue home medication.  5. Sleep difficulties:  -Patient states she is having a hard time sleeping at night I have started some sleep medication while here in the hospital. She will probably benefit from some therapy and behavioral management. -Presently stable  6. Chronic pain syndrome:  -Patient takes long-acting narcotics at home for chronic pain we will continue these medications at discharge -Currently stable  7. Obesity: Weight loss is encouraged.  8. Irritable bowel syndrome: Likely this was diagnosed after her colon cancer she has diarrhea frequently. There has been no change in her stool patterns. Continue supportive care  9. Ecoli bacteremia without sepsis -Initially febrile, now afebrile x over past 24hrs -Will provide 2 weeks total course of abx -HIDA ordered however pt did not tolerate. Per GI OK for outpatient f/u and possible MRCP as outpatient   Discharge Instructions   Allergies as of 02/25/2018      Reactions   Phenergan [promethazine Hcl] Anxiety   Promethazine Anxiety   Trazodone And Nefazodone Rash      Medication List    STOP taking these medications   NARCAN 4 MG/0.1ML Liqd nasal spray kit Generic drug:  naloxone     TAKE these medications  cefdinir 300 MG capsule Commonly known as:  OMNICEF Take 1 capsule (300 mg total) by mouth 2 (two) times daily for 10 days.   diclofenac sodium 1 % Gel Commonly known as:  VOLTAREN Apply 2 g topically 4 (four) times daily.   gabapentin 300 MG capsule Commonly  known as:  NEURONTIN One po qAM, two po q PM and 2 po q HS What changed:    how much to take  how to take this  when to take this  additional instructions   hydrochlorothiazide 12.5 MG capsule Commonly known as:  MICROZIDE Take 1 capsule (12.5 mg total) by mouth daily.   lisinopril 20 MG tablet Commonly known as:  PRINIVIL,ZESTRIL   metroNIDAZOLE 500 MG tablet Commonly known as:  FLAGYL Take 1 tablet (500 mg total) by mouth every 8 (eight) hours for 10 days.   ondansetron 4 MG tablet Commonly known as:  ZOFRAN Take 4 mg by mouth every 6 (six) hours as needed for nausea.   Oxycodone HCl 10 MG Tabs Take 10 mg by mouth 5 (five) times daily.   VENTOLIN HFA 108 (90 Base) MCG/ACT inhaler Generic drug:  albuterol   VITAMIN B-12 IJ Inject 1 Dose as directed every 30 (thirty) days.   XTAMPZA ER 9 MG C12a Generic drug:  oxyCODONE ER Take 9 mg by mouth every 12 (twelve) hours.      Follow-up Information    Arta Silence, MD. Schedule an appointment as soon as possible for a visit in 4 week(s).   Specialty:  Gastroenterology Why:  Follow-up for pancreatitis. Contact information: [address] [place] [address] [phone]          Allergies  Allergen Reactions  . Phenergan [Promethazine Hcl] Anxiety  . Promethazine Anxiety  . Trazodone And Nefazodone Rash    Consultations:  GI  Procedures/Studies: Nm Hepatobiliary Liver Func  Result Date: 02/25/2018 CLINICAL DATA:  Right upper quadrant pain.  Suspected cholecystitis. EXAM: NUCLEAR MEDICINE HEPATOBILIARY IMAGING WITH GALLBLADDER EF TECHNIQUE: Sequential images of the abdomen were obtained out to 60 minutes following intravenous administration of radiopharmaceutical. After oral ingestion of Ensure, gallbladder ejection fraction was determined. At 60 min, normal ejection fraction is greater than 33%. RADIOPHARMACEUTICALS:  5.2 mCi Tc-19m Choletec IV COMPARISON:  CT 02/22/2018. FINDINGS:  Prompt uptake and biliary excretion of activity by the liver is seen. Gallbladder activity is visualized, consistent with patency of cystic duct. Biliary activity passes into small bowel, consistent with patent common bile duct. The patient vomited in the test was stopped without obtaining ejection fraction images. IMPRESSION: No evidence of cholecystitis. Gallbladder ejection fraction not obtained due to patient vomiting. Electronically Signed   By: TGrafton  On: 02/25/2018 14:25   Ct Abdomen Pelvis W Contrast  Result Date: 02/22/2018 CLINICAL DATA:  Upper abdominal pain for 2 weeks. EXAM: CT ABDOMEN AND PELVIS WITH CONTRAST TECHNIQUE: Multidetector CT imaging of the abdomen and pelvis was performed using the standard protocol following bolus administration of intravenous contrast. CONTRAST:  1072mOMNIPAQUE IOHEXOL 300 MG/ML  SOLN COMPARISON:  CT scan 09/17/2017 FINDINGS: Lower chest: The lung bases are clear except for areas of subpleural atelectasis. No worrisome pulmonary lesions. The heart is within normal limits in size. Age advanced coronary artery calcifications are noted. Hepatobiliary: No focal hepatic lesions or intrahepatic biliary dilatation. There is layering sludge and numerous small gallstones in the gallbladder. I do not see any definite CT findings for acute cholecystitis and there is  no common bile duct dilatation. Pancreas: No mass, inflammation or ductal dilatation. Spleen: Normal size.  No focal lesions. Adrenals/Urinary Tract: The adrenal glands are normal. No renal, ureteral or bladder calculi or mass. Stomach/Bowel: Surgical changes from gastric bypass surgery. No complicating features are identified. The small bowel and colon are grossly normal without oral contrast. No obvious acute inflammatory changes, mass lesions or obstructive findings. Stable surgical changes involving the right colon from probable partial right hemicolectomy. Vascular/Lymphatic: Moderate  atherosclerotic calcifications involving the aorta but no aneurysm or dissection. The branch vessels are patent. The major venous structures are patent. Reproductive: The uterus is surgically absent. Both ovaries are still present and appear normal. Other: Surgical changes from anterior abdominal wall hernia repair with mesh. No findings for recurrent hernia. Musculoskeletal: No significant bony findings. IMPRESSION: 1. Cholelithiasis and layering sludge in the gallbladder but no definite CT findings for acute cholecystitis and no intra or extrahepatic biliary dilatation. 2. No renal, ureteral or bladder calculi or mass. 3. Surgical changes from gastric bypass surgery without complicating features. 4. Surgical changes from anterior abdominal wall hernia repair with mesh. No recurrent hernia. 5. Partial right hemicolectomy. Electronically Signed   By: Marijo Sanes M.D.   On: 02/22/2018 17:19   US Abdomen Limited Ruq  Result Date: 02/22/2018 CLINICAL DATA:  Right upper quadrant pain, abdominal pain EXAM: ULTRASOUND ABDOMEN LIMITED RIGHT UPPER QUADRANT COMPARISON:  None. FINDINGS: Gallbladder: Cholelithiasis. No pericholecystic fluid or gallbladder wall thickening. Largest gallstone measures 9 mm. No sonographic Murphy sign noted by sonographer. Common bile duct: Diameter: 7 mm Liver: 10 x 7 x 10 mm anechoic left hepatic mass most consistent with a cyst. Similar 8 x 8 x 12 mm anechoic left hepatic mass most consistent with a cyst. Normal hepatic echogenicity. Portal vein is patent on color Doppler imaging with normal direction of blood flow towards the liver. IMPRESSION: 1. Cholelithiasis without sonographic evidence of acute cholecystitis. Electronically Signed   By: Kathreen Devoid   On: 02/22/2018 12:45     Subjective: Eager to go home  Discharge Exam: Vitals:   02/25/18 1134 02/25/18 1135  BP: 130/72   Pulse: 75 79  Resp:  16  Temp: 99.1 F (37.3 C)   SpO2: 96% 96%   Vitals:   02/25/18 0434  02/25/18 0629 02/25/18 1134 02/25/18 1135  BP:  136/79 130/72   Pulse: 83 79 75 79  Resp:  18  16  Temp:  99.1 F (37.3 C) 99.1 F (37.3 C)   TempSrc:  Oral Oral   SpO2:  99% 96% 96%  Weight:  113.9 kg    Height:        General: Pt is alert, awake, not in acute distress Cardiovascular: RRR, S1/S2 +, no rubs, no gallops Respiratory: CTA bilaterally, no wheezing, no rhonchi Abdominal: Soft, NT, ND, bowel sounds + Extremities: no edema, no cyanosis   The results of significant diagnostics from this hospitalization (including imaging, microbiology, ancillary and laboratory) are listed below for reference.     Microbiology: Recent Results (from the past 240 hour(s))  Blood Culture (routine x 2)     Status: Abnormal   Collection Time: 02/22/18 10:52 AM  Result Value Ref Range Status   Specimen Description BLOOD LEFT ANTECUBITAL  Final   Special Requests   Final    BOTTLES DRAWN AEROBIC AND ANAEROBIC Blood Culture results may not be optimal due to an inadequate volume of blood received in culture bottles   Culture  Setup Time  Final    GRAM NEGATIVE RODS BLOOD AEROBIC BOTTLE CRITICAL RESULT CALLED TO, READ BACK BY AND VERIFIED WITH: Lorre Munroe [id number] FCP Performed at [facility], [place] [address], [place], [place] [address]    Culture ESCHERICHIA COLI (A)  Final   Report Status 02/25/2018 FINAL  Final   Organism ID, Bacteria ESCHERICHIA COLI  Final      Susceptibility   Escherichia coli - MIC*    AMPICILLIN >=32 RESISTANT Resistant     CEFAZOLIN >=64 RESISTANT Resistant     CEFEPIME <=1 SENSITIVE Sensitive     CEFTAZIDIME <=1 SENSITIVE Sensitive     CEFTRIAXONE <=1 SENSITIVE Sensitive     CIPROFLOXACIN >=4 RESISTANT Resistant     GENTAMICIN <=1 SENSITIVE Sensitive     IMIPENEM <=0.25 SENSITIVE Sensitive     TRIMETH/SULFA <=20 SENSITIVE Sensitive     AMPICILLIN/SULBACTAM 4 SENSITIVE Sensitive     PIP/TAZO <=4 SENSITIVE Sensitive     * ESCHERICHIA COLI   Blood Culture ID Panel (Reflexed)     Status: Abnormal   Collection Time: 02/22/18 10:52 AM  Result Value Ref Range Status   Enterococcus species NOT DETECTED NOT DETECTED Final   Listeria monocytogenes NOT DETECTED NOT DETECTED Final   Staphylococcus species NOT DETECTED NOT DETECTED Final   Staphylococcus aureus (BCID) NOT DETECTED NOT DETECTED Final   Streptococcus species NOT DETECTED NOT DETECTED Final   Streptococcus agalactiae NOT DETECTED NOT DETECTED Final   Streptococcus pneumoniae NOT DETECTED NOT DETECTED Final   Streptococcus pyogenes NOT DETECTED NOT DETECTED Final   Acinetobacter baumannii NOT DETECTED NOT DETECTED Final   Enterobacteriaceae species DETECTED (A) NOT DETECTED Final    Comment: Enterobacteriaceae represent a large family of gram-negative bacteria, not a single organism. CRITICAL RESULT CALLED TO, READ BACK BY AND VERIFIED WITH: [place], H 1812 [id number] FCP    Enterobacter cloacae complex NOT DETECTED NOT DETECTED Final   Escherichia coli DETECTED (A) NOT DETECTED Final    Comment: CRITICAL RESULT CALLED TO, READ BACK BY AND VERIFIED WITH: PHARMD BAIRD, H 1812 [id number] FCP    Klebsiella oxytoca NOT DETECTED NOT DETECTED Final   Klebsiella pneumoniae NOT DETECTED NOT DETECTED Final   Proteus species NOT DETECTED NOT DETECTED Final   Serratia marcescens NOT DETECTED NOT DETECTED Final   Carbapenem resistance NOT DETECTED NOT DETECTED Final   Haemophilus influenzae NOT DETECTED NOT DETECTED Final   Neisseria meningitidis NOT DETECTED NOT DETECTED Final   Pseudomonas aeruginosa NOT DETECTED NOT DETECTED Final   Candida albicans NOT DETECTED NOT DETECTED Final   Candida glabrata NOT DETECTED NOT DETECTED Final   Candida krusei NOT DETECTED NOT DETECTED Final   Candida parapsilosis NOT DETECTED NOT DETECTED Final   Candida tropicalis NOT DETECTED NOT DETECTED Final    Comment: Performed at [facility], [place] [address], [place], [place] [address]   Blood Culture (routine x 2)     Status: None (Preliminary result)   Collection Time: 02/22/18 12:58 PM  Result Value Ref Range Status   Specimen Description BLOOD SITE NOT SPECIFIED  Final   Special Requests   Final    BOTTLES DRAWN AEROBIC AND ANAEROBIC Blood Culture results may not be optimal due to an inadequate volume of blood received in culture bottles   Culture   Final    NO GROWTH 3 DAYS Performed at [facility], [place] [address], [place], [place] [address]    Report Status PENDING  Incomplete  Labs: BNP (last 3 results) No results for input(s): BNP in the last 8760 hours. Basic Metabolic Panel: Recent Labs  Lab 02/22/18 1115 02/22/18 1734 02/23/18 0523 02/24/18 0521 02/25/18 0514  NA 139  --  141 137 139  K 4.8  --  4.6 4.6 4.4  CL 107  --  111 105 106  CO2 22  --  22 21* 24  GLUCOSE 126*  --  96 95 100*  BUN 23*  --  18 14 11   CREATININE 1.30* 1.20* 1.22* 1.15* 1.17*  CALCIUM 9.5  --  8.5* 8.7* 8.8*   Liver Function Tests: Recent Labs  Lab 02/22/18 1115 02/23/18 0523 02/24/18 0521 02/25/18 0514  AST 573* 182* 77* 36  ALT 153* 116* 83* 56*  ALKPHOS 152* 138* 140* 135*  BILITOT 2.6* 4.6* 2.3* 1.4*  PROT 6.9 6.0* 6.4* 5.9*  ALBUMIN 3.4* 2.6* 2.6* 2.5*   Recent Labs  Lab 02/22/18 1115 02/24/18 0521  LIPASE 739* 42   No results for input(s): AMMONIA in the last 168 hours. CBC: Recent Labs  Lab 02/22/18 1115 02/22/18 1734 02/23/18 0523  WBC 9.1 8.2 6.7  NEUTROABS 8.3*  --   --   HGB 11.4* 11.3* 10.2*  HCT 38.0 37.5 33.3*  MCV 102.2* 102.5* 100.9*  PLT 177 137* 128*   Cardiac Enzymes: No results for input(s): CKTOTAL, CKMB, CKMBINDEX, TROPONINI in the last 168 hours. BNP: Invalid input(s): POCBNP CBG: Recent Labs  Lab 02/24/18 1126 02/24/18 1651 02/24/18 2121 02/25/18 0808 02/25/18 1136  GLUCAP 99 103* 144* 97 111*   D-Dimer No results for input(s): DDIMER in the last 72 hours. Hgb A1c Recent Labs    02/22/18 1734   HGBA1C 5.1   Lipid Profile No results for input(s): CHOL, HDL, LDLCALC, TRIG, CHOLHDL, LDLDIRECT in the last 72 hours. Thyroid function studies No results for input(s): TSH, T4TOTAL, T3FREE, THYROIDAB in the last 72 hours.  Invalid input(s): FREET3 Anemia work up No results for input(s): VITAMINB12, FOLATE, FERRITIN, TIBC, IRON, RETICCTPCT in the last 72 hours. Urinalysis    Component Value Date/Time   COLORURINE AMBER (A) 02/22/2018 1510   APPEARANCEUR HAZY (A) 02/22/2018 1510   LABSPEC 1.015 02/22/2018 1510   PHURINE 5.0 02/22/2018 1510   GLUCOSEU NEGATIVE 02/22/2018 1510   HGBUR NEGATIVE 02/22/2018 1510   HGBUR negative 04/14/2010 0917   BILIRUBINUR NEGATIVE 02/22/2018 1510   BILIRUBINUR neg 09/16/2017 1600   KETONESUR NEGATIVE 02/22/2018 1510   PROTEINUR NEGATIVE 02/22/2018 1510   UROBILINOGEN 0.2 09/16/2017 1600   UROBILINOGEN 1.0 08/28/2011 1359   NITRITE NEGATIVE 02/22/2018 1510   LEUKOCYTESUR NEGATIVE 02/22/2018 1510   Sepsis Labs Invalid input(s): PROCALCITONIN,  WBC,  LACTICIDVEN Microbiology Recent Results (from the past 240 hour(s))  Blood Culture (routine x 2)     Status: Abnormal   Collection Time: 02/22/18 10:52 AM  Result Value Ref Range Status   Specimen Description BLOOD LEFT ANTECUBITAL  Final   Special Requests   Final    BOTTLES DRAWN AEROBIC AND ANAEROBIC Blood Culture results may not be optimal due to an inadequate volume of blood received in culture bottles   Culture  Setup Time   Final    GRAM NEGATIVE RODS BLOOD AEROBIC BOTTLE CRITICAL RESULT CALLED TO, READ BACK BY AND VERIFIED WITH: Lorre Munroe [id number] FCP Performed at [facility], [place] [address] [place], [place] [address]    Culture ESCHERICHIA COLI (A)  Final   Report Status 02/25/2018 FINAL  Final   Organism ID, Bacteria ESCHERICHIA COLI  Final      Susceptibility   Escherichia coli - MIC*    AMPICILLIN >=32 RESISTANT Resistant     CEFAZOLIN >=64 RESISTANT Resistant      CEFEPIME <=1 SENSITIVE Sensitive     CEFTAZIDIME <=1 SENSITIVE Sensitive     CEFTRIAXONE <=1 SENSITIVE Sensitive     CIPROFLOXACIN >=4 RESISTANT Resistant     GENTAMICIN <=1 SENSITIVE Sensitive     IMIPENEM <=0.25 SENSITIVE Sensitive     TRIMETH/SULFA <=20 SENSITIVE Sensitive     AMPICILLIN/SULBACTAM 4 SENSITIVE Sensitive     PIP/TAZO <=4 SENSITIVE Sensitive     * ESCHERICHIA COLI  Blood Culture ID Panel (Reflexed)     Status: Abnormal   Collection Time: 02/22/18 10:52 AM  Result Value Ref Range Status   Enterococcus species NOT DETECTED NOT DETECTED Final   Listeria monocytogenes NOT DETECTED NOT DETECTED Final   Staphylococcus species NOT DETECTED NOT DETECTED Final   Staphylococcus aureus (BCID) NOT DETECTED NOT DETECTED Final   Streptococcus species NOT DETECTED NOT DETECTED Final   Streptococcus agalactiae NOT DETECTED NOT DETECTED Final   Streptococcus pneumoniae NOT DETECTED NOT DETECTED Final   Streptococcus pyogenes NOT DETECTED NOT DETECTED Final   Acinetobacter baumannii NOT DETECTED NOT DETECTED Final   Enterobacteriaceae species DETECTED (A) NOT DETECTED Final    Comment: Enterobacteriaceae represent a large family of gram-negative bacteria, not a single organism. CRITICAL RESULT CALLED TO, READ BACK BY AND VERIFIED WITH: [place], H 1812 [id number] FCP    Enterobacter cloacae complex NOT DETECTED NOT DETECTED Final   Escherichia coli DETECTED (A) NOT DETECTED Final    Comment: CRITICAL RESULT CALLED TO, READ BACK BY AND VERIFIED WITH: PHARMD BAIRD, H 1812 [id number] FCP    Klebsiella oxytoca NOT DETECTED NOT DETECTED Final   Klebsiella pneumoniae NOT DETECTED NOT DETECTED Final   Proteus species NOT DETECTED NOT DETECTED Final   Serratia marcescens NOT DETECTED NOT DETECTED Final   Carbapenem resistance NOT DETECTED NOT DETECTED Final   Haemophilus influenzae NOT DETECTED NOT DETECTED Final   Neisseria meningitidis NOT DETECTED NOT DETECTED Final   Pseudomonas  aeruginosa NOT DETECTED NOT DETECTED Final   Candida albicans NOT DETECTED NOT DETECTED Final   Candida glabrata NOT DETECTED NOT DETECTED Final   Candida krusei NOT DETECTED NOT DETECTED Final   Candida parapsilosis NOT DETECTED NOT DETECTED Final   Candida tropicalis NOT DETECTED NOT DETECTED Final    Comment: Performed at [facility], [place] [address], [place], [place] [address]  Blood Culture (routine x 2)     Status: None (Preliminary result)   Collection Time: 02/22/18 12:58 PM  Result Value Ref Range Status   Specimen Description BLOOD SITE NOT SPECIFIED  Final   Special Requests   Final    BOTTLES DRAWN AEROBIC AND ANAEROBIC Blood Culture results may not be optimal due to an inadequate volume of blood received in culture bottles   Culture   Final    NO GROWTH 3 DAYS Performed at [facility], [place] [address] [place], [place] [address]    Report Status PENDING  Incomplete   Time spent: 30mn  SIGNED:   SMarylu Lund MD  [facility] 02/25/2018, 2:34 PM  If 7PM-7AM, please contact night-coverage

## 2018-02-25 NOTE — Treatment Plan (Signed)
Patient initially wanted to go home and follow up with GI for outpatient work up. Pt has since changed her mind and decided to stay for MRCP. Notified GI. Have ordered MRCP. Discharge cancelled.

## 2018-02-25 NOTE — Progress Notes (Signed)
[facility] Gastroenterology Progress Note  Misty [name] 57 y.o. 23-Nov-1960  CC: Acute pancreatitis   Subjective: She continues to feel better.  Mild abdominal discomfort but denied nausea vomiting.  Had a bowel movement yesterday.  Denies blood in the stool or black stool.  ROS :  Negative for chest pain and shortness of breath.   Objective: Vital signs in last 24 hours: Vitals:   02/25/18 0434 02/25/18 0629  BP:  136/79  Pulse: 83 79  Resp:  18  Temp:  99.1 F (37.3 C)  SpO2:  99%    Physical Exam:  General:  Alert, cooperative, no distress, appears stated age  Head:  Normocephalic, without obvious abnormality, atraumatic  Eyes:  , EOM's intact,   Lungs:   Clear to auscultation bilaterally, respirations unlabored  Heart:  Regular rate and rhythm, S1, S2 normal  Abdomen:   Soft, mild epigastric tenderness to palpation,, bowel sounds present, no peritoneal sign, abdomen is soft  Extremities: Extremities normal, atraumatic, 1 + edema       Lab Results: Recent Labs    02/24/18 0521 02/25/18 0514  NA 137 139  K 4.6 4.4  CL 105 106  CO2 21* 24  GLUCOSE 95 100*  BUN 14 11  CREATININE 1.15* 1.17*  CALCIUM 8.7* 8.8*   Recent Labs    02/24/18 0521 02/25/18 0514  AST 77* 36  ALT 83* 56*  ALKPHOS 140* 135*  BILITOT 2.3* 1.4*  PROT 6.4* 5.9*  ALBUMIN 2.6* 2.5*   Recent Labs    02/22/18 1115 02/22/18 1734 02/23/18 0523  WBC 9.1 8.2 6.7  NEUTROABS 8.3*  --   --   HGB 11.4* 11.3* 10.2*  HCT 38.0 37.5 33.3*  MCV 102.2* 102.5* 100.9*  PLT 177 137* 128*   No results for input(s): LABPROT, INR in the last 72 hours.    Assessment/Plan: -Acute pancreatitis.  Most likely  alcohol use but gallstone pancreatitis cannot be ruled out..  Improving.  Ultrasound on February 22, 2018 showed cholelithiasis without acute cholecystitis.  Elevated lipase at 739 on admission.  CT scan showed normal-appearing pancreas without biliary ductal dilatation.  Sludge in gallbladder  was seen. -E. coli / Enterobacteriaceae  bacteremia. -Abnormal LFTs.  Trending down.  Recommendations ------------------------- -Follow HIDA scan.  If negative, okay to discharge home from GI standpoint.  If HIDA scan positive, she will need surgical evaluation. -Advance diet as tolerated. -Antibiotics per primary team. -Follow-up with Dr. Paulita Fujita in 4 weeks after discharge. -GI will sign off.  Call us back if needed  Otis Brace MD, [place] 02/25/2018, 9:09 AM  Contact #  [phone]

## 2018-02-25 NOTE — Consult Note (Signed)
Misty [name] CM Primary Care Navigator  02/25/2018  Misty [name] 1960/07/28 [id number]    Met with patient and daughter Misty [name]- 5W RN at the bedside to identify possible discharge needs.  Patient reportshaving severe abdominal pain that resulted to this admission.(pancreatitis, cholelithiasis, E. Coli bacteremia)  Patient endorsesDr. Billey Gosling with [facility] as her[facility] provider.   [facility] [address] [facility].   Patientstatesthat she has beenmanaging her own medications [facility] use of "pill box' system filled once a week.   Patientverbalized that she has beendriving prior to admission. Her daughter will be able to provide transportation  toherdoctors' appointments if needed post discharge.  Patient mentioned living alone and independent with self care. She has 3 daughters, but her daughter- Misty [name] is her main or primary caregiver at home when needed after discharge.  Anticipated discharge plan ishomewhen ready per patient.  Patientvoiced understanding to call primary care provider's office for a post discharge follow-up appointment within1- 2 weeks orsooner if needs arise. Patient letter (with PCP's contact number) wasprovided as a reminder. Patient reports that she has a prior scheduled appointment to see primary care provider next week.  Discussed with patient regarding [facility] services available for health management andresourcesat homebut patient deniesneeding any servicesat this time. Patient voicedunderstandingof needto seekreferral from primary care provider to [facility] care management ifdeemed necessary and appropriatefor anyservicesin the future.  [facility] care management information was provided for futureneeds thatshemay have.  However, patient hadverbally agreedand optedfor EMMI calls tofollow-up with her recovery at home.    Referral made for [facility] General calls after discharge.   For additional questions please contact:  Edwena Felty A. Lakyn Mantione, BSN, RN-BC [facility] PRIMARY CARE Navigator Cell: [phone]

## 2018-02-26 ENCOUNTER — Inpatient Hospital Stay (HOSPITAL_COMMUNITY): Payer: Medicare Other

## 2018-02-26 ENCOUNTER — Telehealth: Payer: Self-pay

## 2018-02-26 DIAGNOSIS — Z789 Other specified health status: Secondary | ICD-10-CM

## 2018-02-26 LAB — COMPREHENSIVE METABOLIC PANEL
ALT: 43 U/L (ref 0–44)
AST: 29 U/L (ref 15–41)
Albumin: 2.6 g/dL — ABNORMAL LOW (ref 3.5–5.0)
Alkaline Phosphatase: 123 U/L (ref 38–126)
Anion gap: 9 (ref 5–15)
BUN: 9 mg/dL (ref 6–20)
CO2: 23 mmol/L (ref 22–32)
Calcium: 9 mg/dL (ref 8.9–10.3)
Chloride: 105 mmol/L (ref 98–111)
Creatinine, Ser: 0.85 mg/dL (ref 0.44–1.00)
GFR calc Af Amer: >60 mL/min (ref >60)
GFR calc non Af Amer: >60 mL/min (ref >60)
Glucose, Bld: 93 mg/dL (ref 70–99)
Potassium: 3.7 mmol/L (ref 3.5–5.1)
Sodium: 137 mmol/L (ref 135–145)
Total Bilirubin: 1.2 mg/dL (ref 0.3–1.2)
Total Protein: 6.3 g/dL — ABNORMAL LOW (ref 6.5–8.1)

## 2018-02-26 MED ORDER — GADOBUTROL 1 MMOL/ML IV SOLN
8.0000 mL | Freq: Once | INTRAVENOUS | Status: AC | PRN
  Administered 2018-02-26: 8 mL via INTRAVENOUS

## 2018-02-26 MED ORDER — FLUCONAZOLE 100 MG PO TABS: 100.0000 mg | ORAL_TABLET | Freq: Every day | ORAL | 0 refills | Status: AC | PRN

## 2018-02-26 MED FILL — FLUCONAZOLE 100 MG TABLET: 100 | 10 days supply | Qty: 10 | Fill #0

## 2018-02-26 NOTE — Care Management Important Message (Signed)
Important Message  Patient Details  Name: Misty [name] MRN: [id number] Date of Birth: 1960/04/29   Medicare Important Message Given:  Yes    Ivadell Gaul P [place] 02/26/2018, 12:20 PM

## 2018-02-26 NOTE — Evaluation (Signed)
Physical Therapy Evaluation & Discharge Patient Details Name: Misty [name] MRN: [id number] DOB: 12-26-1960 Today's Date: 02/26/2018   History of Present Illness  Pt is a 57 y.o. female admitted 02/22/18 with c/o abdominal pain and nausea. Worked up for acute pancreatitis. PMH includes colon CA, chronic back pain, HTN, MS, gastric bypass.    Clinical Impression  Patient evaluated by Physical Therapy with no further acute PT needs identified. PTA, pt mod indep with use of SPC for community ambulation due to back pain; lives alone with family support nearby. Today, pt indep with transfers, ambulation and ADLs. All education has been completed and the patient has no further questions. Acute PT is signing off. Thank you for this referral.    Follow Up Recommendations No PT follow up;Supervision - Intermittent    Equipment Recommendations  None recommended by PT    Recommendations for Other Services       Precautions / Restrictions Precautions Precautions: None Restrictions Weight Bearing Restrictions: No      Mobility  Bed Mobility Overal bed mobility: Independent                Transfers Overall transfer level: Independent                  Ambulation/Gait Ambulation/Gait assistance: Independent Gait Distance (Feet): 400 Feet Assistive device: None Gait Pattern/deviations: Step-through pattern;Decreased stride length;Wide base of support Gait velocity: Decreased Gait velocity interpretation: 1.31 - 2.62 ft/sec, indicative of limited community ambulator General Gait Details: Slow, steady ambulation; intermittent support from hallway rail  Stairs            Wheelchair Mobility    Modified Rankin (Stroke Patients Only)       Balance Overall balance assessment: Needs assistance   Sitting balance-Leahy Scale: Good       Standing balance-Leahy Scale: Good                               Pertinent Vitals/Pain Pain Assessment:  Faces Faces Pain Scale: Hurts a little bit Pain Location: R shoulder (reports h/o frozen shoulder); RLE (reports h/o sciatica) Pain Descriptors / Indicators: Sore;Grimacing Pain Intervention(s): Monitored during session    Home Living Family/patient expects to be discharged to:: Private residence Living Arrangements: Alone Available Help at Discharge: Family;Friend(s);Available PRN/intermittently Type of Home: House       Home Layout: Two level;Bed/bath upstairs Home Equipment: Cane - single point;Walker - 2 wheels      Prior Function Level of Independence: Independent with assistive device(s)         Comments: Uses SPC for community ambulation. Drives. Limited by chronic back pain     Hand Dominance        Extremity/Trunk Assessment   Upper Extremity Assessment Upper Extremity Assessment: Overall WFL for tasks assessed    Lower Extremity Assessment Lower Extremity Assessment: Overall WFL for tasks assessed    Cervical / Trunk Assessment Cervical / Trunk Assessment: Kyphotic  Communication   Communication: No difficulties  Cognition Arousal/Alertness: Awake/alert Behavior During Therapy: WFL for tasks assessed/performed Overall Cognitive Status: Within Functional Limits for tasks assessed                                        General Comments      Exercises     Assessment/Plan    PT  Assessment Patent does not need any further PT services  PT Problem List         PT Treatment Interventions      PT Goals (Current goals can be found in the Care Plan section)  Acute Rehab PT Goals PT Goal Formulation: All assessment and education complete, DC therapy    Frequency     Barriers to discharge        Co-evaluation               AM-PAC PT "6 Clicks" Mobility  Outcome Measure Help needed turning from your back to your side while in a flat bed without using bedrails?: None Help needed moving from lying on your back to sitting  on the side of a flat bed without using bedrails?: None Help needed moving to and from a bed to a chair (including a wheelchair)?: None Help needed standing up from a chair using your arms (e.g., wheelchair or bedside chair)?: None Help needed to walk in hospital room?: None Help needed climbing 3-5 steps with a railing? : A Little 6 Click Score: 23    End of Session   Activity Tolerance: Patient tolerated treatment well Patient left: in bed;with call bell/phone within reach Nurse Communication: Mobility status PT Visit Diagnosis: Other abnormalities of gait and mobility (R26.89)    Time: 8588-5027 PT Time Calculation (min) (ACUTE ONLY): 14 min   Charges:   PT Evaluation $PT Eval Low Complexity: [place], PT, DPT Acute Rehabilitation Services  Pager [phone] Office [place] 02/26/2018, 11:46 AM

## 2018-02-26 NOTE — Progress Notes (Signed)
MD speaking to patient regarding MRCP test results.

## 2018-02-26 NOTE — Telephone Encounter (Signed)
Transition Care Management Follow-up Telephone Call   Date discharged? 02/26/2018   How have you been since you were released from the hospital?  Do you understand why you were in the hospital? mostly  Do you understand the discharge instructions? yes  Where were you discharged to? home  Items Reviewed:  Medications reviewed: yes  Allergies reviewed: yes  Dietary changes reviewed: yes  Referrals reviewed: yes   Functional Questionnaire:  Activities of Daily Living (ADLs):    States they are independent in the following:All  States they require assistance with the following: n/a Any transportation issues/concerns?: no Any patient concerns? No   Confirmed importance and date/time of follow-up visits scheduled yes  Provider Appointment booked with  Confirmed with patient if condition begins to worsen call PCP or go to the ER.  Patient was given the office number and encouraged to call back with question or concerns: yes

## 2018-02-26 NOTE — Progress Notes (Signed)
Pt and daughter request discontinuation of CBG monitoring and Insulin.

## 2018-02-26 NOTE — Progress Notes (Signed)
Pt has orders to be discharged. Discharge instructions given and pt has no additional questions at this time. Medication regimen reviewed and pt educated. Pt verbalized understanding and has no additional questions. Telemetry box removed. IV removed and site in good condition. Pt stable and waiting for transportation. 

## 2018-02-26 NOTE — Progress Notes (Signed)
[facility] Gastroenterology Progress Note  NATARA MONFORT 57 y.o. 09/23/1960  CC: Acute pancreatitis   Subjective:  Had some nausea and vomiting with CCK yesterday but doing better today.  Abdominal pain resolved.  ROS :  Negative for chest pain and shortness of breath.   Objective: Vital signs in last 24 hours: Vitals:   02/25/18 2127 02/26/18 0532  BP: 130/78 (!) 146/92  Pulse: 61 68  Resp: 18 18  Temp: 98.4 F (36.9 C) 98.6 F (37 C)  SpO2: 97% 96%    Physical Exam:  General:  Alert, cooperative, no distress, appears stated age  Head:  Normocephalic, without obvious abnormality, atraumatic  Eyes:  , EOM's intact,   Lungs:   Clear to auscultation bilaterally, respirations unlabored  Heart:  Regular rate and rhythm, S1, S2 normal  Abdomen:   Soft, nontender, bowel sounds present, no peritoneal sign, abdomen is soft  Extremities: Extremities normal, atraumatic, 1 + edema       Lab Results: Recent Labs    02/25/18 0514 02/26/18 0525  NA 139 137  K 4.4 3.7  CL 106 105  CO2 24 23  GLUCOSE 100* 93  BUN 11 9  CREATININE 1.17* 0.85  CALCIUM 8.8* 9.0   Recent Labs    02/25/18 0514 02/26/18 0525  AST 36 29  ALT 56* 43  ALKPHOS 135* 123  BILITOT 1.4* 1.2  PROT 5.9* 6.3*  ALBUMIN 2.5* 2.6*   No results for input(s): WBC, NEUTROABS, HGB, HCT, MCV, PLT in the last 72 hours. No results for input(s): LABPROT, INR in the last 72 hours.    Assessment/Plan: -Acute pancreatitis.  Most likely  alcohol use but gallstone pancreatitis cannot be ruled out..  Improving.  Ultrasound on February 22, 2018 showed cholelithiasis without acute cholecystitis.  Elevated lipase at 739 on admission.  CT scan showed normal-appearing pancreas without biliary ductal dilatation.  Sludge in gallbladder was seen. -E. coli / Enterobacteriaceae  bacteremia. -Abnormal LFTs.  Resolved.  Recommendations ------------------------- -HIDA scan showed no evidence of cholecystitis but ejection  fraction was not calculated because of patient's nausea and vomiting.  MRCP was ordered because of nausea vomiting and epigastric pain which is done but report pending. -Okay to discharge home if MRI negative -Heart healthy diet. -Antibiotics per primary team. -Follow-up with Dr. Paulita Fujita in 4 weeks after discharge -Recommend outpatient surgical referral for cholecystectomy. -GI will sign off.  Call us back if needed  Otis Brace MD, [place] 02/26/2018, 9:54 AM  Contact #  [phone]

## 2018-02-26 NOTE — Discharge Summary (Addendum)
Discharge Summary  Misty [name] ZSM:[id number] DOB: 1961/02/03  PCP: [name], MD  Admit date: 02/22/2018 Discharge date: 02/26/2018  Time spent: 35 minutes  Recommendations for Outpatient Follow-up:  1. Follow-up with your PCP in 1 to 2 weeks 2. Follow-up with GI Dr. Paulita Fujita in 4 weeks after discharge  Discharge Diagnoses:  Active Hospital Problems   Diagnosis Date Noted  . Pancreatitis 02/22/2018  . Heavy alcohol use 02/22/2018  . Cholelithiasis 02/22/2018  . Chronic pain syndrome 10/12/2015  . Sleep difficulties 07/15/2015  . OBESITY 05/24/2008  . HYPERTENSION, BENIGN ESSENTIAL 05/24/2008  . Irritable bowel syndrome 05/24/2008    Resolved Hospital Problems  No resolved problems to display.    Discharge Condition: Stable  Diet recommendation: Heart healthy diet  Vitals:   02/25/18 2127 02/26/18 0532  BP: 130/78 (!) 146/92  Pulse: 61 68  Resp: 18 18  Temp: 98.4 F (36.9 C) 98.6 F (37 C)  SpO2: 97% 96%    History of present illness:  57 y.o.femalewith medical history significant ofchronic back pain, colon cancer, gastroesophageal reflux disease, hypertension, MS, gastric bypass who presents emergency department for complaint of epigastric and right upper abdominal pain. Pain is been present intermittently for 2 weeks it is constant and severe in nature. It is associated with nausea and decreased appetite. Has had sweats and chills for the last week but has had no vomiting she reports chronic diarrhea since being diagnosed with her colon cancer which is unchanged. Is had no bloody stools or hematemesis has not eaten for the last several days. Pain does not radiate it is epigastric in nature but does not go to the back it is sharp. Seen at urgent care a week ago had a normal x-ray and was given Zofran. CT scan done at [facility] but results are not yet available. In the past she has been told that it is her hernia that is causing the  pain.  Of note the patient's son died in 01/13/2023 and since then she has been drinking fairly heavily. She is drinking about 1/2 pint of liquor every day.  02/26/2018: Patient seen and examined at her bedside.  No acute events overnight.  Patient reports her symptoms have completely resolved.  Had MRCP done on 02/26/2018, results are pending.  HIDA scan with no evidence of cholecystitis.  Ejection fraction could not be calculated due to the patient's nausea and vomiting.   Hospital Course:  Principal Problem:   Pancreatitis Active Problems:   OBESITY   HYPERTENSION, BENIGN ESSENTIAL   Irritable bowel syndrome   Sleep difficulties   Chronic pain syndrome   Heavy alcohol use   Cholelithiasis  Acute alcoholic pancreatitis: -GI following -Likely ETOH pancreatitis per GI -Diet started per GI. Tolerating clears.Successfully advanced diet  E. coli bacteremia secondary to suspected GI source MRCP done on 02/26/2018.  Results are pending Continue cefdinir x10 days and p.o. Flagyl x10 days Follow-up with GI and PCP  Heavy alcohol use: -Reported large quantities of ETOH noted -Cessation done at bedside -Continue on CIWA. No active withdrawals at this time  Cholelithiasis:   -Biliary sludge on imaging noted -Seems stable at this time -HIDA recommended by GI, however pt did not tolerate -Discussed with GI, recommendation for MRCP which could be done as outpt per GI. Pt eager to go home   Hypertension, benign essential:  -Vitals reviewed. Stable -Continue home medication.  Sleep difficulties:  -Patient states she is having a hard time sleeping at night  I have started some sleep medication while here in the hospital. She will probably benefit from some therapy and behavioral management. -Presently stable  Chronic pain syndrome:  -Patient takes long-acting narcotics at home for chronic pain we will continue these medications at discharge -Currently stable  Obesity:  Weight loss is encouraged.  Irritable bowel syndrome: Likely this was diagnosed after her colon cancer she has diarrhea frequently. There has been no change in her stool patterns. Continue supportive care  Vaginal candidiasis Take po fluconazole as needed daily for yeast while on antibioitics    Procedures:  None  Consultations:  GI  Discharge Exam: BP (!) 146/92 (BP Location: Right Arm)   Pulse 68   Temp 98.6 F (37 C) (Oral)   Resp 18   Ht 5' 6" (1.676 m)   Wt 112.2 kg   SpO2 96%   BMI 39.92 kg/m  . General: 57 y.o. year-old female well developed well nourished in no acute distress.  Alert and oriented x3. . Cardiovascular: Regular rate and rhythm with no rubs or gallops.  No thyromegaly or JVD noted.   Marland Kitchen Respiratory: Clear to auscultation with no wheezes or rales. Good inspiratory effort. . Abdomen: Soft nontender nondistended with normal bowel sounds x4 quadrants. . Musculoskeletal: No lower extremity edema. 2/4 pulses in all 4 extremities. . Skin: No ulcerative lesions noted or rashes, . Psychiatry: Mood is appropriate for condition and setting  Discharge Instructions You were cared for by a hospitalist during your hospital stay. If you have any questions about your discharge medications or the care you received while you were in the hospital after you are discharged, you can call the unit and asked to speak with the hospitalist on call if the hospitalist that took care of you is not available. Once you are discharged, your primary care physician will handle any further medical issues. Please note that NO REFILLS for any discharge medications will be authorized once you are discharged, as it is imperative that you return to your primary care physician (or establish a relationship with a primary care physician if you do not have one) for your aftercare needs so that they can reassess your need for medications and monitor your lab values.   Allergies as of 02/26/2018       Reactions   Phenergan [promethazine Hcl] Anxiety   Promethazine Anxiety   Trazodone And Nefazodone Rash      Medication List    STOP taking these medications   NARCAN 4 MG/0.1ML Liqd nasal spray kit Generic drug:  naloxone     TAKE these medications   cefdinir 300 MG capsule Commonly known as:  OMNICEF Take 1 capsule (300 mg total) by mouth 2 (two) times daily for 10 days.   diclofenac sodium 1 % Gel Commonly known as:  VOLTAREN Apply 2 g topically 4 (four) times daily.   fluconazole 100 MG tablet Commonly known as:  DIFLUCAN Take 1 tablet (100 mg total) by mouth daily as needed for up to 14 days. Take as needed for vaginal yeast infection   gabapentin 300 MG capsule Commonly known as:  NEURONTIN One po qAM, two po q PM and 2 po q HS What changed:    how much to take  how to take this  when to take this  additional instructions   hydrochlorothiazide 12.5 MG capsule Commonly known as:  MICROZIDE Take 1 capsule (12.5 mg total) by mouth daily.   lisinopril 20 MG tablet Commonly known as:  PRINIVIL,ZESTRIL   metroNIDAZOLE 500 MG tablet Commonly known as:  FLAGYL Take 1 tablet (500 mg total) by mouth every 8 (eight) hours for 10 days.   ondansetron 4 MG tablet Commonly known as:  ZOFRAN Take 4 mg by mouth every 6 (six) hours as needed for nausea.   Oxycodone HCl 10 MG Tabs Take 10 mg by mouth 5 (five) times daily.   VENTOLIN HFA 108 (90 Base) MCG/ACT inhaler Generic drug:  albuterol   VITAMIN B-12 IJ Inject 1 Dose as directed every 30 (thirty) days.   XTAMPZA ER 9 MG C12a Generic drug:  oxyCODONE ER Take 9 mg by mouth every 12 (twelve) hours.      Allergies  Allergen Reactions  . Phenergan [Promethazine Hcl] Anxiety  . Promethazine Anxiety  . Trazodone And Nefazodone Rash   Follow-up Information    Arta Silence, MD. Schedule an appointment as soon as possible for a visit on 03/26/2018.   Specialty:  Gastroenterology Why:  Follow-up for  pancreatitis. Contact information: [address] [place] [address] [phone]        [name], MD. Go on 03/03/2018.   Specialty:  Internal Medicine Why:  _0  Contact information: [place] [address] [phone]            The results of significant diagnostics from this hospitalization (including imaging, microbiology, ancillary and laboratory) are listed below for reference.    Significant Diagnostic Studies: Nm Hepatobiliary Liver Func  Result Date: 02/25/2018 CLINICAL DATA:  Right upper quadrant pain.  Suspected cholecystitis. EXAM: NUCLEAR MEDICINE HEPATOBILIARY IMAGING WITH GALLBLADDER EF TECHNIQUE: Sequential images of the abdomen were obtained out to 60 minutes following intravenous administration of radiopharmaceutical. After oral ingestion of Ensure, gallbladder ejection fraction was determined. At 60 min, normal ejection fraction is greater than 33%. RADIOPHARMACEUTICALS:  5.2 mCi Tc-43m Choletec IV COMPARISON:  CT 02/22/2018. FINDINGS: Prompt uptake and biliary excretion of activity by the liver is seen. Gallbladder activity is visualized, consistent with patency of cystic duct. Biliary activity passes into small bowel, consistent with patent common bile duct. The patient vomited in the test was stopped without obtaining ejection fraction images. IMPRESSION: No evidence of cholecystitis. Gallbladder ejection fraction not obtained due to patient vomiting. Electronically Signed   By: TCostilla  On: 02/25/2018 14:25   Ct Abdomen Pelvis W Contrast  Result Date: 02/22/2018 CLINICAL DATA:  Upper abdominal pain for 2 weeks. EXAM: CT ABDOMEN AND PELVIS WITH CONTRAST TECHNIQUE: Multidetector CT imaging of the abdomen and pelvis was performed using the standard protocol following bolus administration of intravenous contrast. CONTRAST:  1075mOMNIPAQUE IOHEXOL 300 MG/ML  SOLN COMPARISON:  CT scan 09/17/2017 FINDINGS: Lower chest:  The lung bases are clear except for areas of subpleural atelectasis. No worrisome pulmonary lesions. The heart is within normal limits in size. Age advanced coronary artery calcifications are noted. Hepatobiliary: No focal hepatic lesions or intrahepatic biliary dilatation. There is layering sludge and numerous small gallstones in the gallbladder. I do not see any definite CT findings for acute cholecystitis and there is no common bile duct dilatation. Pancreas: No mass, inflammation or ductal dilatation. Spleen: Normal size.  No focal lesions. Adrenals/Urinary Tract: The adrenal glands are normal. No renal, ureteral or bladder calculi or mass. Stomach/Bowel: Surgical changes from gastric bypass surgery. No complicating features are identified. The small bowel and colon are grossly normal without oral contrast. No obvious acute inflammatory changes, mass lesions or obstructive  findings. Stable surgical changes involving the right colon from probable partial right hemicolectomy. Vascular/Lymphatic: Moderate atherosclerotic calcifications involving the aorta but no aneurysm or dissection. The branch vessels are patent. The major venous structures are patent. Reproductive: The uterus is surgically absent. Both ovaries are still present and appear normal. Other: Surgical changes from anterior abdominal wall hernia repair with mesh. No findings for recurrent hernia. Musculoskeletal: No significant bony findings. IMPRESSION: 1. Cholelithiasis and layering sludge in the gallbladder but no definite CT findings for acute cholecystitis and no intra or extrahepatic biliary dilatation. 2. No renal, ureteral or bladder calculi or mass. 3. Surgical changes from gastric bypass surgery without complicating features. 4. Surgical changes from anterior abdominal wall hernia repair with mesh. No recurrent hernia. 5. Partial right hemicolectomy. Electronically Signed   By: Marijo Sanes M.D.   On: 02/22/2018 17:19   Mr 3d Recon At  Scanner  Result Date: 02/26/2018 CLINICAL DATA:  RIGHT upper quadrant pain.  Cholelithiasis EXAM: MRI ABDOMEN WITHOUT AND WITH CONTRAST (INCLUDING MRCP) TECHNIQUE: Multiplanar multisequence MR imaging of the abdomen was performed both before and after the administration of intravenous contrast. Heavily T2-weighted images of the biliary and pancreatic ducts were obtained, and three-dimensional MRCP images were rendered by post processing. CONTRAST:  8 mL Gadavist COMPARISON:  CT 02/22/2018, ultrasound 02/25/2018 FINDINGS: Lower chest:  Lung bases are clear. Hepatobiliary: Multiple gallstones layer dependently within the lumen of the gallbladder. Stones ranging in size from 3-8 mm. Stones extend into the gallbladder neck (image 34/15). No pericholecystic fluid. No gallbladder wall thickening. No gallbladder distension. Common bile duct normal caliber. No filling defect within the common bile duct. No intrahepatic biliary duct dilatation. No pancreatic lesion other than several small benign cysts in the LEFT hepatic lobe. Pancreas: Normal pancreatic parenchymal intensity. No ductal dilatation or inflammation. Spleen: Normal spleen. Adrenals/urinary tract: Adrenal glands and kidneys are normal. Stomach/Bowel: Stomach and limited of the small bowel is unremarkable Vascular/Lymphatic: Abdominal aortic normal caliber. No retroperitoneal periportal lymphadenopathy. Musculoskeletal: No aggressive osseous lesion IMPRESSION: 1. Cholelithiasis without evidence cholecystitis. Gallstones extend into the gallbladder neck without evidence obstruction. 2. No choledocholithiasis. 3. Normal liver parenchyma. 4. Normal pancreas. Electronically Signed   By: Suzy Bouchard M.D.   On: 02/26/2018 10:07   Mr Abdomen Mrcp Moise Boring Contast  Result Date: 02/26/2018 CLINICAL DATA:  RIGHT upper quadrant pain.  Cholelithiasis EXAM: MRI ABDOMEN WITHOUT AND WITH CONTRAST (INCLUDING MRCP) TECHNIQUE: Multiplanar multisequence MR imaging of the  abdomen was performed both before and after the administration of intravenous contrast. Heavily T2-weighted images of the biliary and pancreatic ducts were obtained, and three-dimensional MRCP images were rendered by post processing. CONTRAST:  8 mL Gadavist COMPARISON:  CT 02/22/2018, ultrasound 02/25/2018 FINDINGS: Lower chest:  Lung bases are clear. Hepatobiliary: Multiple gallstones layer dependently within the lumen of the gallbladder. Stones ranging in size from 3-8 mm. Stones extend into the gallbladder neck (image 34/15). No pericholecystic fluid. No gallbladder wall thickening. No gallbladder distension. Common bile duct normal caliber. No filling defect within the common bile duct. No intrahepatic biliary duct dilatation. No pancreatic lesion other than several small benign cysts in the LEFT hepatic lobe. Pancreas: Normal pancreatic parenchymal intensity. No ductal dilatation or inflammation. Spleen: Normal spleen. Adrenals/urinary tract: Adrenal glands and kidneys are normal. Stomach/Bowel: Stomach and limited of the small bowel is unremarkable Vascular/Lymphatic: Abdominal aortic normal caliber. No retroperitoneal periportal lymphadenopathy. Musculoskeletal: No aggressive osseous lesion IMPRESSION: 1. Cholelithiasis without evidence cholecystitis. Gallstones extend into the gallbladder neck  without evidence obstruction. 2. No choledocholithiasis. 3. Normal liver parenchyma. 4. Normal pancreas. Electronically Signed   By: Suzy Bouchard M.D.   On: 02/26/2018 10:07   US Abdomen Limited Ruq  Result Date: 02/22/2018 CLINICAL DATA:  Right upper quadrant pain, abdominal pain EXAM: ULTRASOUND ABDOMEN LIMITED RIGHT UPPER QUADRANT COMPARISON:  None. FINDINGS: Gallbladder: Cholelithiasis. No pericholecystic fluid or gallbladder wall thickening. Largest gallstone measures 9 mm. No sonographic Murphy sign noted by sonographer. Common bile duct: Diameter: 7 mm Liver: 10 x 7 x 10 mm anechoic left hepatic mass  most consistent with a cyst. Similar 8 x 8 x 12 mm anechoic left hepatic mass most consistent with a cyst. Normal hepatic echogenicity. Portal vein is patent on color Doppler imaging with normal direction of blood flow towards the liver. IMPRESSION: 1. Cholelithiasis without sonographic evidence of acute cholecystitis. Electronically Signed   By: Kathreen Devoid   On: 02/22/2018 12:45    Microbiology: Recent Results (from the past 240 hour(s))  Blood Culture (routine x 2)     Status: Abnormal   Collection Time: 02/22/18 10:52 AM  Result Value Ref Range Status   Specimen Description BLOOD LEFT ANTECUBITAL  Final   Special Requests   Final    BOTTLES DRAWN AEROBIC AND ANAEROBIC Blood Culture results may not be optimal due to an inadequate volume of blood received in culture bottles   Culture  Setup Time   Final    GRAM NEGATIVE RODS BLOOD AEROBIC BOTTLE CRITICAL RESULT CALLED TO, READ BACK BY AND VERIFIED WITH: Lorre Munroe [id number] FCP Performed at [facility], [place] [address] [place], [place] [address]    Culture ESCHERICHIA COLI (A)  Final   Report Status 02/25/2018 FINAL  Final   Organism ID, Bacteria ESCHERICHIA COLI  Final      Susceptibility   Escherichia coli - MIC*    AMPICILLIN >=32 RESISTANT Resistant     CEFAZOLIN >=64 RESISTANT Resistant     CEFEPIME <=1 SENSITIVE Sensitive     CEFTAZIDIME <=1 SENSITIVE Sensitive     CEFTRIAXONE <=1 SENSITIVE Sensitive     CIPROFLOXACIN >=4 RESISTANT Resistant     GENTAMICIN <=1 SENSITIVE Sensitive     IMIPENEM <=0.25 SENSITIVE Sensitive     TRIMETH/SULFA <=20 SENSITIVE Sensitive     AMPICILLIN/SULBACTAM 4 SENSITIVE Sensitive     PIP/TAZO <=4 SENSITIVE Sensitive     * ESCHERICHIA COLI  Blood Culture ID Panel (Reflexed)     Status: Abnormal   Collection Time: 02/22/18 10:52 AM  Result Value Ref Range Status   Enterococcus species NOT DETECTED NOT DETECTED Final   Listeria monocytogenes NOT DETECTED NOT DETECTED Final    Staphylococcus species NOT DETECTED NOT DETECTED Final   Staphylococcus aureus (BCID) NOT DETECTED NOT DETECTED Final   Streptococcus species NOT DETECTED NOT DETECTED Final   Streptococcus agalactiae NOT DETECTED NOT DETECTED Final   Streptococcus pneumoniae NOT DETECTED NOT DETECTED Final   Streptococcus pyogenes NOT DETECTED NOT DETECTED Final   Acinetobacter baumannii NOT DETECTED NOT DETECTED Final   Enterobacteriaceae species DETECTED (A) NOT DETECTED Final    Comment: Enterobacteriaceae represent a large family of gram-negative bacteria, not a single organism. CRITICAL RESULT CALLED TO, READ BACK BY AND VERIFIED WITH: PHARMD BAIRD, H [id number] FCP    Enterobacter cloacae complex NOT DETECTED NOT DETECTED Final   Escherichia coli DETECTED (A) NOT DETECTED Final    Comment: CRITICAL RESULT CALLED TO, READ BACK BY AND VERIFIED WITH: PHARMD  Tamala Fothergill [id number] FCP    Klebsiella oxytoca NOT DETECTED NOT DETECTED Final   Klebsiella pneumoniae NOT DETECTED NOT DETECTED Final   Proteus species NOT DETECTED NOT DETECTED Final   Serratia marcescens NOT DETECTED NOT DETECTED Final   Carbapenem resistance NOT DETECTED NOT DETECTED Final   Haemophilus influenzae NOT DETECTED NOT DETECTED Final   Neisseria meningitidis NOT DETECTED NOT DETECTED Final   Pseudomonas aeruginosa NOT DETECTED NOT DETECTED Final   Candida albicans NOT DETECTED NOT DETECTED Final   Candida glabrata NOT DETECTED NOT DETECTED Final   Candida krusei NOT DETECTED NOT DETECTED Final   Candida parapsilosis NOT DETECTED NOT DETECTED Final   Candida tropicalis NOT DETECTED NOT DETECTED Final    Comment: Performed at [facility], [place] [address], [place], [place] [address]  Blood Culture (routine x 2)     Status: None (Preliminary result)   Collection Time: 02/22/18 12:58 PM  Result Value Ref Range Status   Specimen Description BLOOD SITE NOT SPECIFIED  Final   Special Requests   Final    BOTTLES DRAWN AEROBIC  AND ANAEROBIC Blood Culture results may not be optimal due to an inadequate volume of blood received in culture bottles   Culture   Final    NO GROWTH 3 DAYS Performed at [facility], [place] [address] [place], [place] [address]    Report Status PENDING  Incomplete     Labs: Basic Metabolic Panel: Recent Labs  Lab 02/22/18 1115 02/22/18 1734 02/23/18 0523 02/24/18 0521 02/25/18 0514 02/26/18 0525  NA 139  --  141 137 139 137  K 4.8  --  4.6 4.6 4.4 3.7  CL 107  --  111 105 106 105  CO2 22  --  22 21* 24 23  GLUCOSE 126*  --  96 95 100* 93  BUN 23*  --  _0 CREATININE 1.30* 1.20* 1.22* 1.15* 1.17* 0.85  CALCIUM 9.5  --  8.5* 8.7* 8.8* 9.0   Liver Function Tests: Recent Labs  Lab 02/22/18 1115 02/23/18 0523 02/24/18 0521 02/25/18 0514 02/26/18 0525  AST 573* 182* 77* 36 29  ALT 153* 116* 83* 56* 43  ALKPHOS 152* 138* 140* 135* 123  BILITOT 2.6* 4.6* 2.3* 1.4* 1.2  PROT 6.9 6.0* 6.4* 5.9* 6.3*  ALBUMIN 3.4* 2.6* 2.6* 2.5* 2.6*   Recent Labs  Lab 02/22/18 1115 02/24/18 0521  LIPASE 739* 42   No results for input(s): AMMONIA in the last 168 hours. CBC: Recent Labs  Lab 02/22/18 1115 02/22/18 1734 02/23/18 0523  WBC 9.1 8.2 6.7  NEUTROABS 8.3*  --   --   HGB 11.4* 11.3* 10.2*  HCT 38.0 37.5 33.3*  MCV 102.2* 102.5* 100.9*  PLT 177 137* 128*   Cardiac Enzymes: No results for input(s): CKTOTAL, CKMB, CKMBINDEX, TROPONINI in the last 168 hours. BNP: BNP (last 3 results) No results for input(s): BNP in the last 8760 hours.  ProBNP (last 3 results) No results for input(s): PROBNP in the last 8760 hours.  CBG: Recent Labs  Lab 02/24/18 1126 02/24/18 1651 02/24/18 2121 02/25/18 0808 02/25/18 1136  GLUCAP 99 103* 144* 97 111*       Signed:  Kayleen Memos, MD [facility] 02/26/2018, 11:57 AM

## 2018-02-27 LAB — CULTURE, BLOOD (ROUTINE X 2): Culture: NO GROWTH

## 2018-03-02 NOTE — Progress Notes (Signed)
Subjective:    Patient ID: Misty [name], female    DOB: Sep 07, 1960, 57 y.o.   MRN: [id number]  HPI The patient is here for follow up from the hospital.  Admitted: 02/22/18 - 02/26/18  She went to the ED with 2 weeks of intermittent epigastric and RUQ pain.  The pain was severe in nature.  She had associated nausea, sweats/chills and decreased appetite.   She denied vomiting, blood in her stool and changes in bowels.  Her son died in 01-02-2023 and she has been drinking excessive alcohol - 1/2 pint of liquor daily.    In ED:  lfts elevated, febrile, bilirubin 2.6, US showed gallstone, but no dilated ducts.  She was diagnosed with pancreatitis - either alcoholic or gallstone related.  She had an MRCP done, which showed cholelithiasis w/o cholecystitis.  Gallstones extend into GB neck w/o obstruction.  No choledocholithiasis.  Normal liver and pancreas. A HIDA done, which showed no evidence of cholecystitis.    Pancreatitis: GI was consulted Likely related to etoh use She denies any abdominal pain at this time.  Her stools are normal.  Ecoli bacteremia secondary to suspected GI source MRCP done Still taking cefdinir x 10 days and Flagyl x 10 days To follow up with GI-has appointment  Heavy alcohol use No withdrawals while in hospital Cessation done at bedside She has not had anything to drink since being home in the hospital and states that she does not even crave alcohol. She is currently staying with her daughter and knows that she cannot drink any alcohol  Cholelithiasis Biliary sludge on imaging Stable MRCP done, HIDA attempted Has follow-up with GI to discuss further  Hypertension VSS -blood pressure well controlled Continued home medication, which she is taking daily as prescribed.  Sleep difficulties She had difficulty sleeping Consider therapy-she did receive Ambien in the hospital and it did not help.  She also received Ativan and it did not help. She does not  want to take anything that is addicting. She states her sleeping difficulties started when her son died  Chronic pain syndrome She takes narcotics from pain medication  Vaginal candidiasis Related to antibiotics Give fluconazole in the hospital - she has at home in case she needs it  Depression: She is depressed.  She is grieving for her son who died unexpectedly a few months ago.  She feels she does have a good support system with family.  Her excessive alcohol use was related to her son's death.  She has not wanted to take any medication, but realizes she should consider this.   Right shoulder pain, right hip pain, physical deconditioning: She is interested in doing physical therapy.  She feels her pain issues and physical deconditioning are definitely inhibiting her physically and making her pain worse.  She would like a referral.   Medications and allergies reviewed with patient and updated if appropriate.  Patient Active Problem List   Diagnosis Date Noted  . Pancreatitis 02/22/2018  . Heavy alcohol use 02/22/2018  . Cholelithiasis 02/22/2018  . Skin eschar 01/28/2018  . Adhesive capsulitis of right shoulder 01/20/2018  . Acute pain of right shoulder 11/15/2017  . Ventral hernia 09/16/2017  . Acute right flank pain 09/16/2017  . Abdominal pain 09/16/2017  . Greater trochanteric bursitis of right hip 04/30/2017  . Groin pain, right 04/17/2017  . Prediabetes 04/16/2017  . Arm pain, left 01/29/2017  . Numbness 01/29/2017  . Chronic midline low back pain 07/20/2016  .  Sacral mass 07/20/2016  . Osteoarthritis of right hip 07/20/2016  . Spinal stenosis of lumbar region 11/29/2015  . Positive urine drug screen 11/16/2015  . Chronic pain syndrome 10/12/2015  . Umbilical hernia 19/50/9326  . Sleep difficulties 07/15/2015  . Bunion, right 06/20/2015  . Callus of foot 04/27/2015  . Muscle spasm 04/16/2015  . B12 deficiency 03/30/2015  . Hammer toe of left foot 02/16/2015  .  Fibrosis of skin of lower extremity 02/16/2015  . Status post right foot surgery 10/26/2014  . Cervical spondylosis with radiculopathy 03/01/2014  . Hammer toe of right foot 01/26/2014  . Benign intracranial hypertension 06/18/2013  . Pain in lower limb 05/22/2013  . Abnormal gait 04/21/2013  . Arthritis 04/21/2013  . Disturbance of skin sensation 04/21/2013  . Cephalalgia 04/21/2013  . Porokeratosis 03/31/2013  . Vitamin D deficiency 04/18/2010  . Fatigue 04/14/2010  . Back pain with right-sided radiculopathy 11/29/2008  . MEDIAL MENISCUS TEAR, LEFT 06/30/2008  . BARRETTS ESOPHAGUS 06/28/2008  . OSTEOARTHRITIS, KNEES, BILATERAL 06/28/2008  . OBESITY 05/24/2008  . HYPERTENSION, BENIGN ESSENTIAL 05/24/2008  . Irritable bowel syndrome 05/24/2008  . BARIATRIC SURGERY STATUS 03/19/2006  . NEOPLASM, MALIGNANT, COLON, HX OF 03/19/1997    Current Outpatient Medications on File Prior to Visit  Medication Sig Dispense Refill  . cefdinir (OMNICEF) 300 MG capsule Take 1 capsule (300 mg total) by mouth 2 (two) times daily for 10 days. 20 capsule 0  . Cyanocobalamin (VITAMIN B-12 IJ) Inject 1 Dose as directed every 30 (thirty) days.     . diclofenac sodium (VOLTAREN) 1 % GEL Apply 2 g topically 4 (four) times daily.   0  . fluconazole (DIFLUCAN) 100 MG tablet Take 1 tablet (100 mg total) by mouth daily as needed for up to 14 days. Take as needed for vaginal yeast infection 10 tablet 0  . gabapentin (NEURONTIN) 300 MG capsule One po qAM, two po q PM and 2 po q HS (Patient taking differently: Take 300-600 mg by mouth See admin instructions. One capsule (300mg ) qAM, two Capsules (600mg ) q PM and 2 capsules (600mg ) q HS) 150 capsule 5  . hydrochlorothiazide (MICROZIDE) 12.5 MG capsule Take 1 capsule (12.5 mg total) by mouth daily. 90 capsule 3  . lisinopril (PRINIVIL,ZESTRIL) 20 MG tablet     . metroNIDAZOLE (FLAGYL) 500 MG tablet Take 1 tablet (500 mg total) by mouth every 8 (eight) hours for 10  days. 30 tablet 0  . ondansetron (ZOFRAN) 4 MG tablet Take 4 mg by mouth every 6 (six) hours as needed for nausea.    . Oxycodone HCl 10 MG TABS Take 10 mg by mouth 5 (five) times daily.     . VENTOLIN HFA 108 (90 Base) MCG/ACT inhaler     . XTAMPZA ER 9 MG C12A Take 9 mg by mouth every 12 (twelve) hours.      No current facility-administered medications on file prior to visit.     Past Medical History:  Diagnosis Date  . Allergy   . Arthritis   . Back pain   . Colon cancer ([place])   . Depression   . Diarrhea   . GERD (gastroesophageal reflux disease)   . Hypertension   . Insomnia   . MS (multiple sclerosis) ([place])   . Other hammer toe (acquired) 03/31/2013  . Pneumonia    hx of  . Umbilical hernia     Past Surgical History:  Procedure Laterality Date  . ABDOMINAL HYSTERECTOMY    .  ANTERIOR CERVICAL DECOMP/DISCECTOMY FUSION N/A 03/01/2014   Procedure: ANTERIOR CERVICAL DECOMPRESSION/DISCECTOMY FUSION 1 LEVEL;  Surgeon: Charlie Pitter, MD;  Location: [facility];  Service: Neurosurgery;  Laterality: N/A;  ANTERIOR CERVICAL DECOMPRESSION/DISCECTOMY FUSION 1 LEVEL  . BUNIONECTOMY Right 10/14/2014   @[facility]   . CESAREAN SECTION    . COLON SURGERY    . COLONOSCOPY    . FOOT SURGERY Right   . GASTRIC BYPASS    . Hammer Toe Repair Right 10/14/2014   RT #2, @[facility]   . TENOTOMY Right 10/14/2014   RT #3, @[facility]     Social History   Socioeconomic History  . Marital status: Single    Spouse name: Not on file  . Number of children: 2  . Years of education: Not on file  . Highest education level: Not on file  Occupational History  . Not on file  Social Needs  . Financial resource strain: Not hard at all  . Food insecurity:    Worry: Never true    Inability: Never true  . Transportation needs:    Medical: No    Non-medical: No  Tobacco Use  . Smoking status: Current Some Day Smoker    Packs/day: 0.25    Years: 20.00    Pack years: 5.00    Types: Cigarettes  . Smokeless tobacco:  Never Used  Substance and Sexual Activity  . Alcohol use: Yes    Alcohol/week: 0.0 standard drinks    Comment: 1/2 pint daily  . Drug use: No  . Sexual activity: Yes    Partners: Male  Lifestyle  . Physical activity:    Days per week: 0 days    Minutes per session: 0 min  . Stress: To some extent  Relationships  . Social connections:    Talks on phone: More than three times a week    Gets together: More than three times a week    Attends religious service: Not on file    Active member of club or organization: Not on file    Attends meetings of clubs or organizations: Not on file    Relationship status: Not on file  Other Topics Concern  . Not on file  Social History Narrative  . Not on file    Family History  Problem Relation Age of Onset  . Stroke Mother   . Hypertension Mother   . Hyperlipidemia Mother   . Diabetes Mother   . Diabetes Brother   . Hypertension Brother   . Hypertension Brother   . Hypertension Brother   . Hypertension Brother   . Hypertension Brother   . Alcoholism Brother     Review of Systems  Constitutional: Positive for appetite change (decreased). Negative for chills and fever.  Respiratory: Negative for cough, shortness of breath and wheezing.   Cardiovascular: Negative for chest pain, palpitations and leg swelling.  Gastrointestinal: Negative for abdominal pain, blood in stool, constipation, diarrhea and nausea.  Genitourinary: Negative for dysuria and hematuria.  Neurological: Negative for light-headedness and headaches.       Objective:   Vitals:   03/03/18 1301  BP: 124/78  Pulse: 82  Resp: 16  Temp: 97.9 F (36.6 C)  SpO2: 98%   BP Readings from Last 3 Encounters:  03/03/18 124/78  02/26/18 (!) 146/92  01/28/18 112/64   Wt Readings from Last 3 Encounters:  03/03/18 248 lb (112.5 kg)  02/26/18 247 lb 4.8 oz (112.2 kg)  01/28/18 243 lb 6.4 oz (110.4 kg)  Body mass index is 40.03 kg/m.   Physical Exam      Constitutional: Appears well-developed and well-nourished. No distress.  HENT:  Head: Normocephalic and atraumatic.  Neck: Neck supple. No tracheal deviation present. No thyromegaly present.  No cervical lymphadenopathy Cardiovascular: Normal rate, regular rhythm and normal heart sounds.   No murmur heard. No carotid bruit .  Mild bilateral lower extremity edema Pulmonary/Chest: Effort normal and breath sounds normal. No respiratory distress. No has no wheezes. No rales.  Abdomen: soft, NT, ND Skin: Skin is warm and dry. Not diaphoretic.  Psychiatric: Depressed mood and affect. Behavior is normal.   MR 3D Recon At Scanner CLINICAL DATA:  RIGHT upper quadrant pain.  Cholelithiasis  EXAM: MRI ABDOMEN WITHOUT AND WITH CONTRAST (INCLUDING MRCP)  TECHNIQUE: Multiplanar multisequence MR imaging of the abdomen was performed both before and after the administration of intravenous contrast. Heavily T2-weighted images of the biliary and pancreatic ducts were obtained, and three-dimensional MRCP images were rendered by post processing.  CONTRAST:  8 mL Gadavist  COMPARISON:  CT 02/22/2018, ultrasound 02/25/2018  FINDINGS: Lower chest:  Lung bases are clear.  Hepatobiliary: Multiple gallstones layer dependently within the lumen of the gallbladder. Stones ranging in size from 3-8 mm. Stones extend into the gallbladder neck (image 34/15). No pericholecystic fluid. No gallbladder wall thickening. No gallbladder distension.  Common bile duct normal caliber. No filling defect within the common bile duct.  No intrahepatic biliary duct dilatation. No pancreatic lesion other than several small benign cysts in the LEFT hepatic lobe.  Pancreas: Normal pancreatic parenchymal intensity. No ductal dilatation or inflammation.  Spleen: Normal spleen.  Adrenals/urinary tract: Adrenal glands and kidneys are normal.  Stomach/Bowel: Stomach and limited of the small bowel  is unremarkable  Vascular/Lymphatic: Abdominal aortic normal caliber. No retroperitoneal periportal lymphadenopathy.  Musculoskeletal: No aggressive osseous lesion  IMPRESSION: 1. Cholelithiasis without evidence cholecystitis. Gallstones extend into the gallbladder neck without evidence obstruction. 2. No choledocholithiasis. 3. Normal liver parenchyma. 4. Normal pancreas.  Electronically Signed   By: Suzy Bouchard M.D.   On: 02/26/2018 10:07 MR ABDOMEN MRCP W WO CONTAST CLINICAL DATA:  RIGHT upper quadrant pain.  Cholelithiasis  EXAM: MRI ABDOMEN WITHOUT AND WITH CONTRAST (INCLUDING MRCP)  TECHNIQUE: Multiplanar multisequence MR imaging of the abdomen was performed both before and after the administration of intravenous contrast. Heavily T2-weighted images of the biliary and pancreatic ducts were obtained, and three-dimensional MRCP images were rendered by post processing.  CONTRAST:  8 mL Gadavist  COMPARISON:  CT 02/22/2018, ultrasound 02/25/2018  FINDINGS: Lower chest:  Lung bases are clear.  Hepatobiliary: Multiple gallstones layer dependently within the lumen of the gallbladder. Stones ranging in size from 3-8 mm. Stones extend into the gallbladder neck (image 34/15). No pericholecystic fluid. No gallbladder wall thickening. No gallbladder distension.  Common bile duct normal caliber. No filling defect within the common bile duct.  No intrahepatic biliary duct dilatation. No pancreatic lesion other than several small benign cysts in the LEFT hepatic lobe.  Pancreas: Normal pancreatic parenchymal intensity. No ductal dilatation or inflammation.  Spleen: Normal spleen.  Adrenals/urinary tract: Adrenal glands and kidneys are normal.  Stomach/Bowel: Stomach and limited of the small bowel is unremarkable  Vascular/Lymphatic: Abdominal aortic normal caliber. No retroperitoneal periportal lymphadenopathy.  Musculoskeletal: No aggressive osseous  lesion  IMPRESSION: 1. Cholelithiasis without evidence cholecystitis. Gallstones extend into the gallbladder neck without evidence obstruction. 2. No choledocholithiasis. 3. Normal liver parenchyma. 4. Normal pancreas.  Electronically Signed   By:  Suzy Bouchard M.D.   On: 02/26/2018 10:07   Lab Results  Component Value Date   WBC 6.7 02/23/2018   HGB 10.2 (L) 02/23/2018   HCT 33.3 (L) 02/23/2018   PLT 128 (L) 02/23/2018   GLUCOSE 93 02/26/2018   CHOL 278 (H) 03/17/2013   TRIG 106 03/17/2013   HDL 121 03/17/2013   LDLCALC 136 (H) 03/17/2013   ALT 43 02/26/2018   AST 29 02/26/2018   NA 137 02/26/2018   K 3.7 02/26/2018   CL 105 02/26/2018   CREATININE 0.85 02/26/2018   BUN 9 02/26/2018   CO2 23 02/26/2018   TSH 0.82 10/05/2015   HGBA1C 5.1 02/22/2018     Assessment & Plan:    See Problem List for Assessment and Plan of chronic medical problems.

## 2018-03-03 ENCOUNTER — Ambulatory Visit (INDEPENDENT_AMBULATORY_CARE_PROVIDER_SITE_OTHER): Payer: Medicare Other | Admitting: Internal Medicine

## 2018-03-03 ENCOUNTER — Encounter: Payer: Self-pay | Admitting: Internal Medicine

## 2018-03-03 VITALS — BP 124/78 | HR 82 | Temp 97.9°F | Resp 16 | Ht 66.0 in | Wt 248.0 lb

## 2018-03-03 DIAGNOSIS — I10 Essential (primary) hypertension: Secondary | ICD-10-CM

## 2018-03-03 DIAGNOSIS — G479 Sleep disorder, unspecified: Secondary | ICD-10-CM | POA: Diagnosis not present

## 2018-03-03 DIAGNOSIS — E538 Deficiency of other specified B group vitamins: Secondary | ICD-10-CM

## 2018-03-03 DIAGNOSIS — G8929 Other chronic pain: Secondary | ICD-10-CM | POA: Diagnosis not present

## 2018-03-03 DIAGNOSIS — Z8719 Personal history of other diseases of the digestive system: Secondary | ICD-10-CM | POA: Diagnosis not present

## 2018-03-03 DIAGNOSIS — Z789 Other specified health status: Secondary | ICD-10-CM | POA: Diagnosis not present

## 2018-03-03 DIAGNOSIS — F32A Depression, unspecified: Secondary | ICD-10-CM | POA: Insufficient documentation

## 2018-03-03 DIAGNOSIS — M25511 Pain in right shoulder: Secondary | ICD-10-CM | POA: Diagnosis not present

## 2018-03-03 DIAGNOSIS — M545 Low back pain, unspecified: Secondary | ICD-10-CM

## 2018-03-03 DIAGNOSIS — R5381 Other malaise: Secondary | ICD-10-CM | POA: Diagnosis not present

## 2018-03-03 DIAGNOSIS — F1090 Alcohol use, unspecified, uncomplicated: Secondary | ICD-10-CM

## 2018-03-03 DIAGNOSIS — M25551 Pain in right hip: Secondary | ICD-10-CM | POA: Diagnosis not present

## 2018-03-03 DIAGNOSIS — K852 Alcohol induced acute pancreatitis without necrosis or infection: Secondary | ICD-10-CM

## 2018-03-03 DIAGNOSIS — F329 Major depressive disorder, single episode, unspecified: Secondary | ICD-10-CM

## 2018-03-03 MED ORDER — CYANOCOBALAMIN 1000 MCG/ML IJ SOLN
1000.0000 ug | Freq: Once | INTRAMUSCULAR | Status: AC
  Administered 2018-03-03: 1000 ug via INTRAMUSCULAR

## 2018-03-03 MED ORDER — SERTRALINE HCL 50 MG PO TABS: 50.0000 mg | ORAL_TABLET | Freq: Every day | ORAL | 5 refills | Status: DC

## 2018-03-03 NOTE — Assessment & Plan Note (Signed)
Right shoulder pain Referred for physical therapy

## 2018-03-03 NOTE — Assessment & Plan Note (Signed)
Right hip pain Referred for physical therapy

## 2018-03-03 NOTE — Patient Instructions (Signed)
  Medications reviewed and updated.  Changes include :   Starting sertraline at bedtime.   Your prescription(s) have been submitted to your pharmacy. Please take as directed and contact our office if you believe you are having problem(s) with the medication(s).  A referral was ordered for physical therapy  Please followup in 4 weeks

## 2018-03-03 NOTE — Assessment & Plan Note (Addendum)
Related to her son's unexpected death - cause unknown Having difficulty sleeping Discussed support - either family/pastor or counselor Discussed medication -she realizes she should try something Start sertraline 50 mg nightly

## 2018-03-03 NOTE — Assessment & Plan Note (Signed)
Has stopped using all alcohol and does not crave it Staying with her daughter now She is depressed, which is why she was drinking excessively - discussed anti-depressant treatment

## 2018-03-03 NOTE — Assessment & Plan Note (Signed)
BP well controlled Current regimen effective and well tolerated Continue current medications at current doses  

## 2018-03-03 NOTE — Assessment & Plan Note (Signed)
B12 injection today 

## 2018-03-03 NOTE — Assessment & Plan Note (Signed)
Has chronic lower back pain and following with pain management Pain medications per pain management Will refer for physical therapy

## 2018-03-03 NOTE — Assessment & Plan Note (Signed)
Related to excessive alcohol use Symptoms have resolved Blood work returned normal in the hospital Stressed the importance of avoiding all alcohol in the future because she is at increased risk of pancreatitis

## 2018-03-03 NOTE — Assessment & Plan Note (Signed)
She did not tolerate trazodone in the past Melatonin not effective Tried Ambien and lorazepam in the hospital-both not effective The main reason for her sleep difficulties is her son's death a few months ago and depression as a result We will treat depression and hopefully sleep will improve

## 2018-03-03 NOTE — Assessment & Plan Note (Signed)
Physical deconditioning and weakness Referred for physical therapy

## 2018-03-10 ENCOUNTER — Ambulatory Visit: Payer: Self-pay

## 2018-03-10 NOTE — Telephone Encounter (Signed)
ret'd call to pt.  Reported several dry scaly patches all over face and up to the hairline.  Stated it started as a rash with small bumps when she was hospitalized recently, and has now spread over face and up into the hairline.  Stated the area is itching.  Denied any raw tissue or drainage, but stated there are some areas of red patches that she feels is related to trying to peel the dry skin off.  Questioned if she has reacted to her antibiotics?  Has been taking Cefdinir and Metronidazole, and will take the last doses at supper today.  Denied any other areas of rash except for her face.  Denied pain, fever.  Appt. Sched. 12/24 with PCP office; pt. Given care advice per protocol.  Verb. Understanding.  Agrees with plan.       Reason for Disposition . Localized rash present > 7 days  Answer Assessment - Initial Assessment Questions 1. APPEARANCE of RASH: "Describe the rash."     Started small rash/ bumps; now dry scaly patches over face 2. LOCATION: "Where is the rash located?"     Face  3. NUMBER: "How many spots are there?"      All over the face  4. SIZE: "How big are the spots?" (Inches, centimeters or compare to size of a coin)      Vary in size  5. ONSET: "When did the rash start?"      When she was in the hospital 6. ITCHING: "Does the rash itch?" If so, ask: "How bad is the itch?"  (Scale 1-10; or mild, moderate, severe)     Some itching  7. PAIN: "Does the rash hurt?" If so, ask: "How bad is the pain?"  (Scale 1-10; or mild, moderate, severe)     Denied  8. OTHER SYMPTOMS: "Do you have any other symptoms?" (e.g., fever)     Denied moisture; all dry; some patchy areas that are red  9. PREGNANCY: "Is there any chance you are pregnant?" "When was your last menstrual period?"     LMP; stopped menstrual cycles  Protocols used: RASH OR REDNESS - LOCALIZED-A-AH Message from [employer] sent at 03/10/2018 1:46 PM EST   Summary: antibiotic side effects   Pt states she has been  on an antibiotic for over a week now and her skin (especially on her face) is now very scaly. Pt wants to know if this is a side effect from the antibiotics. Pt states she was given antibiotics in the hospital but has already followed up with Dr. Quay Burow from that.

## 2018-03-10 NOTE — Telephone Encounter (Signed)
Call placed to patient. Left VM to call office

## 2018-03-11 ENCOUNTER — Ambulatory Visit: Payer: Medicare Other | Admitting: Internal Medicine

## 2018-03-17 DIAGNOSIS — Z79891 Long term (current) use of opiate analgesic: Secondary | ICD-10-CM | POA: Diagnosis not present

## 2018-03-17 DIAGNOSIS — G894 Chronic pain syndrome: Secondary | ICD-10-CM | POA: Diagnosis not present

## 2018-03-17 DIAGNOSIS — M545 Low back pain: Secondary | ICD-10-CM | POA: Diagnosis not present

## 2018-03-18 ENCOUNTER — Ambulatory Visit: Payer: Medicare Other | Admitting: Podiatry

## 2018-03-25 ENCOUNTER — Ambulatory Visit: Payer: Medicare Other | Admitting: Podiatry

## 2018-03-25 DIAGNOSIS — M25551 Pain in right hip: Secondary | ICD-10-CM | POA: Diagnosis not present

## 2018-03-25 DIAGNOSIS — M25511 Pain in right shoulder: Secondary | ICD-10-CM | POA: Diagnosis not present

## 2018-03-25 DIAGNOSIS — M545 Low back pain: Secondary | ICD-10-CM | POA: Diagnosis not present

## 2018-03-26 DIAGNOSIS — K859 Acute pancreatitis without necrosis or infection, unspecified: Secondary | ICD-10-CM | POA: Diagnosis not present

## 2018-03-26 DIAGNOSIS — Z85038 Personal history of other malignant neoplasm of large intestine: Secondary | ICD-10-CM | POA: Diagnosis not present

## 2018-03-26 DIAGNOSIS — R748 Abnormal levels of other serum enzymes: Secondary | ICD-10-CM | POA: Diagnosis not present

## 2018-04-01 DIAGNOSIS — M25511 Pain in right shoulder: Secondary | ICD-10-CM | POA: Diagnosis not present

## 2018-04-01 DIAGNOSIS — M545 Low back pain: Secondary | ICD-10-CM | POA: Diagnosis not present

## 2018-04-01 DIAGNOSIS — M25551 Pain in right hip: Secondary | ICD-10-CM | POA: Diagnosis not present

## 2018-04-08 DIAGNOSIS — M545 Low back pain: Secondary | ICD-10-CM | POA: Diagnosis not present

## 2018-04-08 DIAGNOSIS — M25551 Pain in right hip: Secondary | ICD-10-CM | POA: Diagnosis not present

## 2018-04-08 DIAGNOSIS — M25511 Pain in right shoulder: Secondary | ICD-10-CM | POA: Diagnosis not present

## 2018-04-09 NOTE — Telephone Encounter (Signed)
error 

## 2018-04-10 ENCOUNTER — Other Ambulatory Visit: Payer: Self-pay | Admitting: Internal Medicine

## 2018-04-10 DIAGNOSIS — M545 Low back pain: Secondary | ICD-10-CM | POA: Diagnosis not present

## 2018-04-10 DIAGNOSIS — Z79891 Long term (current) use of opiate analgesic: Secondary | ICD-10-CM | POA: Diagnosis not present

## 2018-04-10 DIAGNOSIS — G894 Chronic pain syndrome: Secondary | ICD-10-CM | POA: Diagnosis not present

## 2018-04-11 DIAGNOSIS — M25551 Pain in right hip: Secondary | ICD-10-CM | POA: Diagnosis not present

## 2018-04-11 DIAGNOSIS — M545 Low back pain: Secondary | ICD-10-CM | POA: Diagnosis not present

## 2018-04-11 DIAGNOSIS — M25511 Pain in right shoulder: Secondary | ICD-10-CM | POA: Diagnosis not present

## 2018-04-15 NOTE — Progress Notes (Signed)
Subjective:    Patient ID: Misty [name], female    DOB: Dec 30, 1960, 58 y.o.   MRN: [id number]  HPI The patient is here for follow up.  Hypertension: She is taking her medication daily. She is compliant with a low sodium diet.  She denies chest pain, palpitations, edema, shortness of breath and regular headaches. She is exercising  -she is doing physical therapy and feels it is helping.      Depression: She is taking her medication daily as prescribed-we started this at her last visit. She denies any side effects from the medication. She feels her depression is better, but she still feels depressed.  She is not sleeping.  Her appetite was getting better, but for the past week she has been feeling nauseous.  Her other doctor was supposed to send in Alcorn to her pharmacy, but she is unsure if they have or not.  She is unsure why she is nauseous.  Alcohol use, h/o pancreatitis: she denies abdominal pain.  She had a few sips of wine last weekend but has not drank anything other than that.  Rash: She has a rash on her posterior neck and upper back that has been itching her.  She has been taking Benadryl and has been taking a lot of it.  Still does not make her sleep.  She needs something for the rash.  Medications and allergies reviewed with patient and updated if appropriate.  Patient Active Problem List   Diagnosis Date Noted  . Depression 03/03/2018  . Right shoulder pain 03/03/2018  . Pain of right hip joint 03/03/2018  . Physical deconditioning 03/03/2018  . Pancreatitis 02/22/2018  . Heavy alcohol use 02/22/2018  . Cholelithiasis 02/22/2018  . Skin eschar 01/28/2018  . Adhesive capsulitis of right shoulder 01/20/2018  . Ventral hernia 09/16/2017  . Abdominal pain 09/16/2017  . Greater trochanteric bursitis of right hip 04/30/2017  . Groin pain, right 04/17/2017  . Prediabetes 04/16/2017  . Arm pain, left 01/29/2017  . Numbness 01/29/2017  . Chronic low back pain  07/20/2016  . Sacral mass 07/20/2016  . Osteoarthritis of right hip 07/20/2016  . Spinal stenosis of lumbar region 11/29/2015  . Positive urine drug screen 11/16/2015  . Chronic pain syndrome 10/12/2015  . Umbilical hernia 35/57/3220  . Sleep difficulties 07/15/2015  . Bunion, right 06/20/2015  . Callus of foot 04/27/2015  . Muscle spasm 04/16/2015  . B12 deficiency 03/30/2015  . Hammer toe of left foot 02/16/2015  . Fibrosis of skin of lower extremity 02/16/2015  . Status post right foot surgery 10/26/2014  . Cervical spondylosis with radiculopathy 03/01/2014  . Hammer toe of right foot 01/26/2014  . Benign intracranial hypertension 06/18/2013  . Abnormal gait 04/21/2013  . Disturbance of skin sensation 04/21/2013  . Cephalalgia 04/21/2013  . Porokeratosis 03/31/2013  . Vitamin D deficiency 04/18/2010  . Fatigue 04/14/2010  . Back pain with right-sided radiculopathy 11/29/2008  . MEDIAL MENISCUS TEAR, LEFT 06/30/2008  . BARRETTS ESOPHAGUS 06/28/2008  . OSTEOARTHRITIS, KNEES, BILATERAL 06/28/2008  . OBESITY 05/24/2008  . HYPERTENSION, BENIGN ESSENTIAL 05/24/2008  . Irritable bowel syndrome 05/24/2008  . BARIATRIC SURGERY STATUS 03/19/2006  . NEOPLASM, MALIGNANT, COLON, HX OF 03/19/1997    Current Outpatient Medications on File Prior to Visit  Medication Sig Dispense Refill  . Cyanocobalamin (VITAMIN B-12 IJ) Inject 1 Dose as directed every 30 (thirty) days.     . diclofenac sodium (VOLTAREN) 1 % GEL Apply 2 g topically 4 (four)  times daily.   0  . gabapentin (NEURONTIN) 300 MG capsule One po qAM, two po q PM and 2 po q HS (Patient taking differently: Take 300-600 mg by mouth See admin instructions. One capsule (300mg ) qAM, two Capsules (600mg ) q PM and 2 capsules (600mg ) q HS) 150 capsule 5  . hydrochlorothiazide (MICROZIDE) 12.5 MG capsule TAKE 1 CAPSULE BY MOUTH ONCE DAILY 90 capsule 0  . lisinopril (PRINIVIL,ZESTRIL) 20 MG tablet TAKE 1 TABLET BY MOUTH ONCE DAILY 90 tablet  0  . ondansetron (ZOFRAN) 4 MG tablet Take 4 mg by mouth every 6 (six) hours as needed for nausea.    . Oxycodone HCl 10 MG TABS Take 10 mg by mouth 5 (five) times daily.     . sertraline (ZOLOFT) 50 MG tablet Take 1 tablet (50 mg total) by mouth at bedtime. 30 tablet 5  . VENTOLIN HFA 108 (90 Base) MCG/ACT inhaler     . XTAMPZA ER 9 MG C12A Take 9 mg by mouth every 12 (twelve) hours.     . meloxicam (MOBIC) 15 MG tablet TAKE 1 TABLET BY MOUTH ONCE DAILY WITH FOOD    . tiZANidine (ZANAFLEX) 2 MG tablet TAKE 1 TABLET BY MOUTH EVERY 6 TO 8 HOURS AS NEEDED. NOT TO EXCEED 3 DOSES IN 24 HOURS     No current facility-administered medications on file prior to visit.     Past Medical History:  Diagnosis Date  . Allergy   . Arthritis   . Back pain   . Colon cancer ([place])   . Depression   . Diarrhea   . GERD (gastroesophageal reflux disease)   . Hypertension   . Insomnia   . MS (multiple sclerosis) ([place])   . Other hammer toe (acquired) 03/31/2013  . Pneumonia    hx of  . Umbilical hernia     Past Surgical History:  Procedure Laterality Date  . ABDOMINAL HYSTERECTOMY    . ANTERIOR CERVICAL DECOMP/DISCECTOMY FUSION N/A 03/01/2014   Procedure: ANTERIOR CERVICAL DECOMPRESSION/DISCECTOMY FUSION 1 LEVEL;  Surgeon: Charlie Pitter, MD;  Location: [facility];  Service: Neurosurgery;  Laterality: N/A;  ANTERIOR CERVICAL DECOMPRESSION/DISCECTOMY FUSION 1 LEVEL  . BUNIONECTOMY Right 10/14/2014   @[facility]   . CESAREAN SECTION    . COLON SURGERY    . COLONOSCOPY    . FOOT SURGERY Right   . GASTRIC BYPASS    . Hammer Toe Repair Right 10/14/2014   RT #2, @[facility]   . TENOTOMY Right 10/14/2014   RT #3, @[facility]     Social History   Socioeconomic History  . Marital status: Single    Spouse name: Not on file  . Number of children: 2  . Years of education: Not on file  . Highest education level: Not on file  Occupational History  . Not on file  Social Needs  . Financial resource strain: Not hard at all  .  Food insecurity:    Worry: Never true    Inability: Never true  . Transportation needs:    Medical: No    Non-medical: No  Tobacco Use  . Smoking status: Current Some Day Smoker    Packs/day: 0.25    Years: 20.00    Pack years: 5.00    Types: Cigarettes  . Smokeless tobacco: Never Used  Substance and Sexual Activity  . Alcohol use: Yes    Alcohol/week: 0.0 standard drinks    Comment: 1/2 pint daily  . Drug use: No  . Sexual activity: Yes  Partners: Male  Lifestyle  . Physical activity:    Days per week: 0 days    Minutes per session: 0 min  . Stress: To some extent  Relationships  . Social connections:    Talks on phone: More than three times a week    Gets together: More than three times a week    Attends religious service: Not on file    Active member of club or organization: Not on file    Attends meetings of clubs or organizations: Not on file    Relationship status: Not on file  Other Topics Concern  . Not on file  Social History Narrative  . Not on file    Family History  Problem Relation Age of Onset  . Stroke Mother   . Hypertension Mother   . Hyperlipidemia Mother   . Diabetes Mother   . Diabetes Brother   . Hypertension Brother   . Hypertension Brother   . Hypertension Brother   . Hypertension Brother   . Hypertension Brother   . Alcoholism Brother     Review of Systems  Constitutional: Negative for chills and fever.  Respiratory: Negative for cough, shortness of breath and wheezing.   Cardiovascular: Negative for chest pain, palpitations and leg swelling.  Gastrointestinal: Positive for constipation (mild) and nausea. Negative for abdominal pain and diarrhea.       No gerd  Skin: Positive for rash (posterior neck).  Neurological: Negative for light-headedness and headaches.       Objective:   Vitals:   04/16/18 1544  BP: 114/66  Pulse: 64  Resp: 16  Temp: 98.2 F (36.8 C)  SpO2: 98%   BP Readings from Last 3 Encounters:    04/16/18 114/66  03/03/18 124/78  02/26/18 (!) 146/92   Wt Readings from Last 3 Encounters:  04/16/18 241 lb (109.3 kg)  03/03/18 248 lb (112.5 kg)  02/26/18 247 lb 4.8 oz (112.2 kg)   Body mass index is 38.9 kg/m.   Physical Exam    Constitutional: Appears well-developed and well-nourished. No distress.  HENT:  Head: Normocephalic and atraumatic.  Neck: Neck supple. No tracheal deviation present. No thyromegaly present.  No cervical lymphadenopathy Cardiovascular: Normal rate, regular rhythm and normal heart sounds.   No murmur heard. No carotid bruit .  No edema Pulmonary/Chest: Effort normal and breath sounds normal. No respiratory distress. No has no wheezes. No rales.  Skin: Skin is warm and dry. Not diaphoretic.  Macular papular rash posterior neck and upper back Psychiatric: Normal mood and affect. Behavior is normal.      Assessment & Plan:    See Problem List for Assessment and Plan of chronic medical problems.

## 2018-04-15 NOTE — Patient Instructions (Addendum)
  B12 injection given.   Medications reviewed and updated.  Changes include :   Triamcinolone cream for your rash on your neck.  zofran as needed for nausea.   mirtazapine at bedtime for your sleep and depression.    Your prescription(s) have been submitted to your pharmacy. Please take as directed and contact our office if you believe you are having problem(s) with the medication(s).    Please followup in 3 months

## 2018-04-16 ENCOUNTER — Ambulatory Visit (INDEPENDENT_AMBULATORY_CARE_PROVIDER_SITE_OTHER): Payer: Medicare Other | Admitting: Internal Medicine

## 2018-04-16 ENCOUNTER — Encounter: Payer: Self-pay | Admitting: Internal Medicine

## 2018-04-16 VITALS — BP 114/66 | HR 64 | Temp 98.2°F | Resp 16 | Ht 66.0 in | Wt 241.0 lb

## 2018-04-16 DIAGNOSIS — I10 Essential (primary) hypertension: Secondary | ICD-10-CM | POA: Diagnosis not present

## 2018-04-16 DIAGNOSIS — E538 Deficiency of other specified B group vitamins: Secondary | ICD-10-CM | POA: Diagnosis not present

## 2018-04-16 DIAGNOSIS — F329 Major depressive disorder, single episode, unspecified: Secondary | ICD-10-CM

## 2018-04-16 DIAGNOSIS — R21 Rash and other nonspecific skin eruption: Secondary | ICD-10-CM

## 2018-04-16 DIAGNOSIS — G479 Sleep disorder, unspecified: Secondary | ICD-10-CM | POA: Diagnosis not present

## 2018-04-16 MED ORDER — CYANOCOBALAMIN 1000 MCG/ML IJ SOLN
1000.0000 ug | Freq: Once | INTRAMUSCULAR | Status: AC
  Administered 2018-04-16: 1000 ug via INTRAMUSCULAR

## 2018-04-16 MED ORDER — ONDANSETRON HCL 4 MG PO TABS: 4.0000 mg | ORAL_TABLET | Freq: Four times a day (QID) | ORAL | 3 refills | Status: DC | PRN

## 2018-04-16 MED ORDER — GABAPENTIN 300 MG PO CAPS: ORAL_CAPSULE | ORAL | 5 refills | Status: DC

## 2018-04-16 MED ORDER — TRIAMCINOLONE ACETONIDE 0.1 % EX CREA: 1.0000 "application " | TOPICAL_CREAM | Freq: Two times a day (BID) | CUTANEOUS | 0 refills | Status: DC

## 2018-04-16 MED ORDER — MIRTAZAPINE 15 MG PO TABS: 15.0000 mg | ORAL_TABLET | Freq: Every day | ORAL | 5 refills | Status: DC

## 2018-04-16 NOTE — Assessment & Plan Note (Signed)
BP well controlled Current regimen effective and well tolerated Continue current medications at current doses  

## 2018-04-16 NOTE — Assessment & Plan Note (Signed)
Posterior neck and upper back Itchy-has been taking Benadryl Stop Benadryl Triamcinolone cream twice daily

## 2018-04-16 NOTE — Assessment & Plan Note (Signed)
Depression a little better Still depressed and not sleeping Continue sertraline 50 mg daily Add remeron 15 mg nightly - start 7.5 mg and if tolerated and still not sleeping well increase to 15 mg nightly.

## 2018-04-16 NOTE — Assessment & Plan Note (Signed)
Continues to have difficulty sleeping Has not tolerated trazodone in the past Melatonin not effective Tried Ambien and lorazepam-not effective Currently not taking gabapentin-we will restart.  Neurology had started this for neuropathy We will also try mirtazapine, which will hopefully help with her depression and sleep-take 7.5 mg for couple of nights and then increase to 15 mg if tolerated

## 2018-04-17 DIAGNOSIS — M545 Low back pain: Secondary | ICD-10-CM | POA: Diagnosis not present

## 2018-04-17 DIAGNOSIS — M25551 Pain in right hip: Secondary | ICD-10-CM | POA: Diagnosis not present

## 2018-04-17 DIAGNOSIS — M25511 Pain in right shoulder: Secondary | ICD-10-CM | POA: Diagnosis not present

## 2018-04-24 DIAGNOSIS — M545 Low back pain: Secondary | ICD-10-CM | POA: Diagnosis not present

## 2018-04-24 DIAGNOSIS — M25551 Pain in right hip: Secondary | ICD-10-CM | POA: Diagnosis not present

## 2018-04-24 DIAGNOSIS — M25511 Pain in right shoulder: Secondary | ICD-10-CM | POA: Diagnosis not present

## 2018-04-29 DIAGNOSIS — M25511 Pain in right shoulder: Secondary | ICD-10-CM | POA: Diagnosis not present

## 2018-04-29 DIAGNOSIS — M25551 Pain in right hip: Secondary | ICD-10-CM | POA: Diagnosis not present

## 2018-04-29 DIAGNOSIS — M545 Low back pain: Secondary | ICD-10-CM | POA: Diagnosis not present

## 2018-05-05 DIAGNOSIS — M25551 Pain in right hip: Secondary | ICD-10-CM | POA: Diagnosis not present

## 2018-05-05 DIAGNOSIS — M25511 Pain in right shoulder: Secondary | ICD-10-CM | POA: Diagnosis not present

## 2018-05-05 DIAGNOSIS — M545 Low back pain: Secondary | ICD-10-CM | POA: Diagnosis not present

## 2018-05-07 DIAGNOSIS — M545 Low back pain: Secondary | ICD-10-CM | POA: Diagnosis not present

## 2018-05-07 DIAGNOSIS — M25511 Pain in right shoulder: Secondary | ICD-10-CM | POA: Diagnosis not present

## 2018-05-07 DIAGNOSIS — M25551 Pain in right hip: Secondary | ICD-10-CM | POA: Diagnosis not present

## 2018-05-08 DIAGNOSIS — Z79891 Long term (current) use of opiate analgesic: Secondary | ICD-10-CM | POA: Diagnosis not present

## 2018-05-08 DIAGNOSIS — G894 Chronic pain syndrome: Secondary | ICD-10-CM | POA: Diagnosis not present

## 2018-05-08 DIAGNOSIS — M545 Low back pain: Secondary | ICD-10-CM | POA: Diagnosis not present

## 2018-05-12 DIAGNOSIS — M545 Low back pain: Secondary | ICD-10-CM | POA: Diagnosis not present

## 2018-05-12 DIAGNOSIS — M25511 Pain in right shoulder: Secondary | ICD-10-CM | POA: Diagnosis not present

## 2018-05-12 DIAGNOSIS — M25551 Pain in right hip: Secondary | ICD-10-CM | POA: Diagnosis not present

## 2018-05-15 DIAGNOSIS — M545 Low back pain: Secondary | ICD-10-CM | POA: Diagnosis not present

## 2018-05-15 DIAGNOSIS — M25551 Pain in right hip: Secondary | ICD-10-CM | POA: Diagnosis not present

## 2018-05-15 DIAGNOSIS — M25511 Pain in right shoulder: Secondary | ICD-10-CM | POA: Diagnosis not present

## 2018-05-16 ENCOUNTER — Ambulatory Visit: Payer: Medicare Other

## 2018-05-19 ENCOUNTER — Ambulatory Visit: Payer: Medicare Other

## 2018-05-19 DIAGNOSIS — M25551 Pain in right hip: Secondary | ICD-10-CM | POA: Diagnosis not present

## 2018-05-19 DIAGNOSIS — M25511 Pain in right shoulder: Secondary | ICD-10-CM | POA: Diagnosis not present

## 2018-05-19 DIAGNOSIS — M545 Low back pain: Secondary | ICD-10-CM | POA: Diagnosis not present

## 2018-05-20 ENCOUNTER — Ambulatory Visit (INDEPENDENT_AMBULATORY_CARE_PROVIDER_SITE_OTHER): Payer: Medicare Other

## 2018-05-20 DIAGNOSIS — E538 Deficiency of other specified B group vitamins: Secondary | ICD-10-CM

## 2018-05-20 MED ORDER — CYANOCOBALAMIN 1000 MCG/ML IJ SOLN
1000.0000 ug | Freq: Once | INTRAMUSCULAR | Status: AC
  Administered 2018-05-20: 1000 ug via INTRAMUSCULAR

## 2018-05-20 NOTE — Progress Notes (Signed)
b12 Injection given.   Misty [place] J Kahmari Koller, MD  

## 2018-05-27 DIAGNOSIS — M25511 Pain in right shoulder: Secondary | ICD-10-CM | POA: Diagnosis not present

## 2018-05-27 DIAGNOSIS — M545 Low back pain: Secondary | ICD-10-CM | POA: Diagnosis not present

## 2018-05-27 DIAGNOSIS — M25551 Pain in right hip: Secondary | ICD-10-CM | POA: Diagnosis not present

## 2018-05-29 DIAGNOSIS — M25551 Pain in right hip: Secondary | ICD-10-CM | POA: Diagnosis not present

## 2018-05-29 DIAGNOSIS — M545 Low back pain: Secondary | ICD-10-CM | POA: Diagnosis not present

## 2018-05-29 DIAGNOSIS — M25511 Pain in right shoulder: Secondary | ICD-10-CM | POA: Diagnosis not present

## 2018-06-03 DIAGNOSIS — M25551 Pain in right hip: Secondary | ICD-10-CM | POA: Diagnosis not present

## 2018-06-03 DIAGNOSIS — M25511 Pain in right shoulder: Secondary | ICD-10-CM | POA: Diagnosis not present

## 2018-06-03 DIAGNOSIS — M545 Low back pain: Secondary | ICD-10-CM | POA: Diagnosis not present

## 2018-06-13 DIAGNOSIS — G894 Chronic pain syndrome: Secondary | ICD-10-CM | POA: Diagnosis not present

## 2018-06-13 DIAGNOSIS — M545 Low back pain: Secondary | ICD-10-CM | POA: Diagnosis not present

## 2018-06-13 DIAGNOSIS — Z79891 Long term (current) use of opiate analgesic: Secondary | ICD-10-CM | POA: Diagnosis not present

## 2018-07-11 DIAGNOSIS — Z79891 Long term (current) use of opiate analgesic: Secondary | ICD-10-CM | POA: Diagnosis not present

## 2018-07-11 DIAGNOSIS — M545 Low back pain: Secondary | ICD-10-CM | POA: Diagnosis not present

## 2018-07-11 DIAGNOSIS — G894 Chronic pain syndrome: Secondary | ICD-10-CM | POA: Diagnosis not present

## 2018-07-12 ENCOUNTER — Other Ambulatory Visit: Payer: Self-pay | Admitting: Internal Medicine

## 2018-07-15 NOTE — Progress Notes (Signed)
Virtual Visit via Video Note  I connected with Misty [name] on 07/16/18 at  3:15 PM EDT by a video enabled telemedicine application and verified that I am speaking with the correct person using two identifiers.   I discussed the limitations of evaluation and management by telemedicine and the availability of in person appointments. The patient expressed understanding and agreed to proceed.  The patient is currently at home and I am in the office.    No referring provider.    History of Present Illness: She is here for follow up of her chronic medical conditions.   She is not exercising regularly.    Right groin pain:  It hurts all day.  A sports rub helps.   It hurts with changing position and walking.  It is constant.  She thinks it is probably arthritis and wants to have it evaluated.  She does take pain medication daily.    Hypertension: She is taking her medication daily. She is compliant with a low sodium diet.  She denies chest pain, palpitations, edema, shortness of breath and regular headaches. She does monitor her blood pressure at home.    Depression: She is taking her medication daily as prescribed. She denies any side effects from the medication. She feels her depression is well controlled and she is happy with her current dose of medication.   Insomnia: she is taking remeron nightly.  It helps some.    Alcohol abuse, h/o pancreatitis:  She is drinking a little alcohol.   Chronic lower back pain:  She follows with pain management.  She takes gabapentin also with her pain medication.  She feels this does help.     Review of Systems  Constitutional: Negative for chills and fever.  Respiratory: Negative for cough, shortness of breath and wheezing.   Cardiovascular: Negative for chest pain, palpitations and leg swelling.  Musculoskeletal:       Right groin pain  Neurological: Negative for headaches.     Social History   Socioeconomic History  . Marital status:  Single    Spouse name: Not on file  . Number of children: 2  . Years of education: Not on file  . Highest education level: Not on file  Occupational History  . Not on file  Social Needs  . Financial resource strain: Not hard at all  . Food insecurity:    Worry: Never true    Inability: Never true  . Transportation needs:    Medical: No    Non-medical: No  Tobacco Use  . Smoking status: Current Some Day Smoker    Packs/day: 0.25    Years: 20.00    Pack years: 5.00    Types: Cigarettes  . Smokeless tobacco: Never Used  Substance and Sexual Activity  . Alcohol use: Yes    Alcohol/week: 0.0 standard drinks    Comment: 1/2 pint daily  . Drug use: No  . Sexual activity: Yes    Partners: Male  Lifestyle  . Physical activity:    Days per week: 0 days    Minutes per session: 0 min  . Stress: To some extent  Relationships  . Social connections:    Talks on phone: More than three times a week    Gets together: More than three times a week    Attends religious service: Not on file    Active member of club or organization: Not on file    Attends meetings of clubs or organizations: Not on  file    Relationship status: Not on file  Other Topics Concern  . Not on file  Social History Narrative  . Not on file     Observations/Objective: Appears well in NAD  BP 128/63, 130/?   Assessment and Plan:  See Problem List for Assessment and Plan of chronic medical problems.   Follow Up Instructions:    I discussed the assessment and treatment plan with the patient. The patient was provided an opportunity to ask questions and all were answered. The patient agreed with the plan and demonstrated an understanding of the instructions.   The patient was advised to call back or seek an in-person evaluation if the symptoms worsen or if the condition fails to improve as anticipated.    [name], MD

## 2018-07-16 ENCOUNTER — Encounter: Payer: Self-pay | Admitting: Internal Medicine

## 2018-07-16 ENCOUNTER — Ambulatory Visit (INDEPENDENT_AMBULATORY_CARE_PROVIDER_SITE_OTHER): Payer: Medicare Other | Admitting: Internal Medicine

## 2018-07-16 DIAGNOSIS — M545 Low back pain, unspecified: Secondary | ICD-10-CM

## 2018-07-16 DIAGNOSIS — R1031 Right lower quadrant pain: Secondary | ICD-10-CM | POA: Diagnosis not present

## 2018-07-16 DIAGNOSIS — F329 Major depressive disorder, single episode, unspecified: Secondary | ICD-10-CM | POA: Diagnosis not present

## 2018-07-16 DIAGNOSIS — G8929 Other chronic pain: Secondary | ICD-10-CM

## 2018-07-16 DIAGNOSIS — I10 Essential (primary) hypertension: Secondary | ICD-10-CM

## 2018-07-16 DIAGNOSIS — F1010 Alcohol abuse, uncomplicated: Secondary | ICD-10-CM

## 2018-07-16 NOTE — Assessment & Plan Note (Signed)
Following with pain management - pain meds per them  I prescribe the gabapentin - will continue

## 2018-07-16 NOTE — Assessment & Plan Note (Signed)
She is concerned about right hip oa Will check xray She deferred ortho/sports med referral at this time - just wants an xray

## 2018-07-16 NOTE — Assessment & Plan Note (Signed)
H/o heavy alcohol use and h/o pancreatitis She did stop for a while, but is drinking a little now Stressed that she should not be drinking at all

## 2018-07-16 NOTE — Assessment & Plan Note (Addendum)
Controlled, stable She feels her medications are working well Had insomnia associated with depression Continue current dose of medication - sertraline and remeron

## 2018-07-16 NOTE — Assessment & Plan Note (Signed)
BP Readings from Last 3 Encounters:  04/16/18 114/66  03/03/18 124/78  02/26/18 (!) 146/92   BP well controlled typically and has been controlled at home Current regimen effective and well tolerated Continue current medications at current doses

## 2018-07-25 ENCOUNTER — Telehealth: Payer: Self-pay | Admitting: Internal Medicine

## 2018-07-25 ENCOUNTER — Ambulatory Visit: Payer: Self-pay

## 2018-07-25 MED ORDER — AMOXICILLIN-POT CLAVULANATE 875-125 MG PO TABS: 1.0000 | ORAL_TABLET | Freq: Two times a day (BID) | ORAL | 0 refills | Status: DC

## 2018-07-25 MED ORDER — FLUCONAZOLE 150 MG PO TABS: 150.0000 mg | ORAL_TABLET | Freq: Once | ORAL | 0 refills | Status: AC

## 2018-07-25 MED ORDER — CYANOCOBALAMIN 1000 MCG/ML IJ SOLN: 1000.0000 ug | Freq: Once | INTRAMUSCULAR | 5 refills | Status: AC

## 2018-07-25 MED ORDER — "SYRINGE 25G X 1"" 3 ML MISC": 3 refills | Status: DC

## 2018-07-25 NOTE — Telephone Encounter (Signed)
sent 

## 2018-07-25 NOTE — Telephone Encounter (Signed)
Please advise 

## 2018-07-25 NOTE — Addendum Note (Signed)
Addended by: [name] on: 07/25/2018 04:45 PM   Modules accepted: Orders

## 2018-07-25 NOTE — Telephone Encounter (Unsigned)
Copied from [place] [id number]. Topic: Quick Communication - Rx Refill/Question >> Jul 25, 2018  2:42 PM Celene Kras A wrote: Medication:Cyanocobalamin (VITAMIN B-12 IJ)   Has the patient contacted their pharmacy? No. (Agent: If no, request that the patient contact the pharmacy for the refill.) (Agent: If yes, when and what did the pharmacy advise?)  Preferred Pharmacy (with phone number or street name): [place], [place] - [address] [place] [address] Phone: [phone] Fax: [phone] Not a 24 hour pharmacy; exact hours not known.    Agent: Please be advised that RX refills may take up to 3 business days. We ask that you follow-up with your pharmacy.

## 2018-07-25 NOTE — Addendum Note (Signed)
Addended by: Delice Bison E on: 07/25/2018 04:39 PM   Modules accepted: Orders

## 2018-07-25 NOTE — Telephone Encounter (Signed)
She was coming into the office to get monthly injections.  Can we get her in here so she does not want to start doing this at home.

## 2018-07-25 NOTE — Telephone Encounter (Signed)
Lets start an antibiotic - augmentin -- pending

## 2018-07-25 NOTE — Addendum Note (Signed)
Addended by: [name] on: 07/25/2018 04:23 PM   Modules accepted: Orders
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
## 2018-07-25 NOTE — Telephone Encounter (Signed)
Patient would like diflucan sent as well as B12 vials for daughter to administer.

## 2018-07-29 NOTE — Telephone Encounter (Signed)
While speaking to the patient she mentioned that her daughter changed the bandage on Sunday and it is starting to look much better. She has been taking her antibiotic since Friday. She also mentioned that her groin/hip area is stil hurting very bad. She mentioned that she was suppose to have an x-ray done but had not heard anything else about it. Confirmed that orders have been entered and she is okay to come and have it done. Call dropped, called patient back, no answer and left message informing.

## 2018-07-29 NOTE — Telephone Encounter (Signed)
Patient has been having her daughter give her the injections to her at home, she is an Therapist, sports so she does not wish to come to the office to have them done.

## 2018-07-29 NOTE — Telephone Encounter (Signed)
Routing to scheduling, can you schedule nurse visit in parking lot for b12 injection---put description of car in appt note and I will give to patient in car----thanks

## 2018-08-08 DIAGNOSIS — Z79891 Long term (current) use of opiate analgesic: Secondary | ICD-10-CM | POA: Diagnosis not present

## 2018-08-08 DIAGNOSIS — M545 Low back pain: Secondary | ICD-10-CM | POA: Diagnosis not present

## 2018-08-08 DIAGNOSIS — G894 Chronic pain syndrome: Secondary | ICD-10-CM | POA: Diagnosis not present

## 2018-08-13 ENCOUNTER — Ambulatory Visit (INDEPENDENT_AMBULATORY_CARE_PROVIDER_SITE_OTHER)
Admission: RE | Admit: 2018-08-13 | Discharge: 2018-08-13 | Disposition: A | Discharge status: Home / Self Care | Payer: Medicare Other | Source: Ambulatory Visit | Attending: Internal Medicine | Admitting: Internal Medicine

## 2018-08-13 ENCOUNTER — Other Ambulatory Visit: Payer: Self-pay

## 2018-08-13 DIAGNOSIS — M1611 Unilateral primary osteoarthritis, right hip: Secondary | ICD-10-CM | POA: Diagnosis not present

## 2018-08-13 DIAGNOSIS — R1031 Right lower quadrant pain: Secondary | ICD-10-CM | POA: Diagnosis not present

## 2018-08-15 ENCOUNTER — Other Ambulatory Visit: Payer: Self-pay | Admitting: Internal Medicine

## 2018-08-15 DIAGNOSIS — M25559 Pain in unspecified hip: Secondary | ICD-10-CM

## 2018-08-26 ENCOUNTER — Ambulatory Visit (INDEPENDENT_AMBULATORY_CARE_PROVIDER_SITE_OTHER): Payer: Medicare Other | Admitting: Orthopaedic Surgery

## 2018-08-26 ENCOUNTER — Other Ambulatory Visit: Payer: Self-pay

## 2018-08-26 ENCOUNTER — Encounter: Payer: Self-pay | Admitting: Orthopaedic Surgery

## 2018-08-26 ENCOUNTER — Ambulatory Visit (INDEPENDENT_AMBULATORY_CARE_PROVIDER_SITE_OTHER): Payer: Medicare Other

## 2018-08-26 VITALS — Ht 64.5 in | Wt 264.0 lb

## 2018-08-26 DIAGNOSIS — M1611 Unilateral primary osteoarthritis, right hip: Secondary | ICD-10-CM

## 2018-08-26 DIAGNOSIS — M545 Low back pain, unspecified: Secondary | ICD-10-CM

## 2018-08-26 NOTE — Progress Notes (Signed)
Subjective: Patient is here for ultrasound-guided intra-articular right hip injection.  Objective:  Walks with limp, can't stand upright.  Pain with IR.  Procedure: Ultrasound-guided right hip injection: After sterile prep with Betadine, injected 8 cc 1% lidocaine without epinephrine and 40 mg methylprednisolone using a 22-gauge spinal needle, passing the needle through the iliofemoral ligament into the femoral head/neck junction.  Injectate seen filling joint capsule.  Excellent immediate relief.

## 2018-08-26 NOTE — Progress Notes (Signed)
Office Visit Note   Patient: Misty [name]           Date of Birth: Oct 23, 1960           MRN: [id number] Visit Date: 08/26/2018              Requested by: [name], MD [place], [place] [address] PCP: [name], MD   Assessment & Plan: Visit Diagnoses:  1. Unilateral primary osteoarthritis, right hip   2. Low back pain, unspecified back pain laterality, unspecified chronicity, unspecified whether sciatica present     Plan: Impression is right hip osteoarthritis and diffuse lumbar spondylosis.  I believe the majority of the patient symptoms are coming from her hip.  We will refer her to Dr. Junius Roads for an ultrasound-guided cortisone injection to the right hip.  She would like to be referred to Dr. Donavan Burnet for her back.  She will follow-up with [name] as needed.  Follow-Up Instructions: Return if symptoms worsen or fail to improve.   Orders:  Orders Placed This Encounter  Procedures  . XR Lumbar Spine 2-3 Views   No orders of the defined types were placed in this encounter.     Procedures: No procedures performed   Clinical Data: No additional findings.   Subjective: Chief Complaint  Patient presents with  . Right Hip - Pain  . Lower Back - Pain    HPI patient is a pleasant 58 year old female who presents our clinic today with right lower back and groin pain.  The pain she has is been ongoing for the past several years and is recently worsened.  No known injury or change in activity.  She describes this as a constant ache worse with standing or activity.  She does have pain at night.  She is been getting oxycodone from a pain clinic.  She does note previous ESI's but no injection to the right hip joint.  Review of Systems as detailed in HPI.  All others reviewed and are negative.   Objective: Vital Signs: Ht 5' 4.5" (1.638 m)   Wt 264 lb (119.7 kg)   BMI 44.62 kg/m   Physical Exam well-developed well-nourished female no acute distress.  Alert  and oriented x3.  Ortho Exam examination of the lumbar spine reveals no spinous or paraspinous tenderness.  She has slight increased pain with lumbar extension and flexion.  Negative straight leg raise.  Markedly positive logroll on the right.  She is neurovascularly intact distally.  Specialty Comments:  No specialty comments available.  Imaging: Xr Lumbar Spine 2-3 Views  Result Date: 08/26/2018 X-rays demonstrate diffuse spondylosis.  Marked degenerative changes to both hips.    PMFS History: Patient Active Problem List   Diagnosis Date Noted  . Right groin pain 07/16/2018  . Rash and nonspecific skin eruption 04/16/2018  . Depression 03/03/2018  . Right shoulder pain 03/03/2018  . Pain of right hip joint 03/03/2018  . Physical deconditioning 03/03/2018  . Pancreatitis 02/22/2018  . Alcohol abuse 02/22/2018  . Cholelithiasis 02/22/2018  . Adhesive capsulitis of right shoulder 01/20/2018  . Ventral hernia 09/16/2017  . Greater trochanteric bursitis of right hip 04/30/2017  . Prediabetes 04/16/2017  . Numbness 01/29/2017  . Chronic low back pain 07/20/2016  . Sacral mass 07/20/2016  . Unilateral primary osteoarthritis, right hip 07/20/2016  . Spinal stenosis of lumbar region 11/29/2015  . Positive urine drug screen 11/16/2015  . Chronic pain syndrome 10/12/2015  . Umbilical hernia 28/36/6294  .  Sleeping difficulties 07/15/2015  . Bunion, right 06/20/2015  . Callus of foot 04/27/2015  . Muscle spasm 04/16/2015  . B12 deficiency 03/30/2015  . Hammer toe of left foot 02/16/2015  . Fibrosis of skin of lower extremity 02/16/2015  . Status post right foot surgery 10/26/2014  . Cervical spondylosis with radiculopathy 03/01/2014  . Hammer toe of right foot 01/26/2014  . Benign intracranial hypertension 06/18/2013  . Abnormal gait 04/21/2013  . Disturbance of skin sensation 04/21/2013  . Cephalalgia 04/21/2013  . Porokeratosis 03/31/2013  . Vitamin D deficiency 04/18/2010   . Low back pain 11/29/2008  . MEDIAL MENISCUS TEAR, LEFT 06/30/2008  . BARRETTS ESOPHAGUS 06/28/2008  . OSTEOARTHRITIS, KNEES, BILATERAL 06/28/2008  . OBESITY 05/24/2008  . HYPERTENSION, BENIGN ESSENTIAL 05/24/2008  . Irritable bowel syndrome 05/24/2008  . BARIATRIC SURGERY STATUS 03/19/2006  . NEOPLASM, MALIGNANT, COLON, HX OF 03/19/1997   Past Medical History:  Diagnosis Date  . Allergy   . Arthritis   . Back pain   . Colon cancer ([place])   . Depression   . Diarrhea   . GERD (gastroesophageal reflux disease)   . Hypertension   . Insomnia   . MS (multiple sclerosis) ([place])   . Other hammer toe (acquired) 03/31/2013  . Pneumonia    hx of  . Umbilical hernia     Family History  Problem Relation Age of Onset  . Stroke Mother   . Hypertension Mother   . Hyperlipidemia Mother   . Diabetes Mother   . Diabetes Brother   . Hypertension Brother   . Hypertension Brother   . Hypertension Brother   . Hypertension Brother   . Hypertension Brother   . Alcoholism Brother     Past Surgical History:  Procedure Laterality Date  . ABDOMINAL HYSTERECTOMY    . ANTERIOR CERVICAL DECOMP/DISCECTOMY FUSION N/A 03/01/2014   Procedure: ANTERIOR CERVICAL DECOMPRESSION/DISCECTOMY FUSION 1 LEVEL;  Surgeon: Charlie Pitter, MD;  Location: [facility];  Service: Neurosurgery;  Laterality: N/A;  ANTERIOR CERVICAL DECOMPRESSION/DISCECTOMY FUSION 1 LEVEL  . BUNIONECTOMY Right 10/14/2014   @[facility]   . CESAREAN SECTION    . COLON SURGERY    . COLONOSCOPY    . FOOT SURGERY Right   . GASTRIC BYPASS    . Hammer Toe Repair Right 10/14/2014   RT #2, @[facility]   . TENOTOMY Right 10/14/2014   RT #3, @[facility]    Social History   Occupational History  . Not on file  Tobacco Use  . Smoking status: Current Some Day Smoker    Packs/day: 0.25    Years: 20.00    Pack years: 5.00    Types: Cigarettes  . Smokeless tobacco: Never Used  Substance and Sexual Activity  . Alcohol use: Yes    Alcohol/week: 0.0 standard drinks     Comment: 1/2 pint daily  . Drug use: No  . Sexual activity: Yes    Partners: Male

## 2018-09-05 DIAGNOSIS — M545 Low back pain: Secondary | ICD-10-CM | POA: Diagnosis not present

## 2018-09-05 DIAGNOSIS — G894 Chronic pain syndrome: Secondary | ICD-10-CM | POA: Diagnosis not present

## 2018-09-05 DIAGNOSIS — Z79891 Long term (current) use of opiate analgesic: Secondary | ICD-10-CM | POA: Diagnosis not present

## 2018-09-16 NOTE — Progress Notes (Signed)
Virtual Visit via Video Note  I connected with Misty [name] on 09/16/18 at 10:15 AM EDT by a video enabled telemedicine application and verified that I am speaking with the correct person using two identifiers.   I discussed the limitations of evaluation and management by telemedicine and the availability of in person appointments. The patient expressed understanding and agreed to proceed.  The patient is currently at home and I am in the office.    No referring provider.    History of Present Illness: This is an acute visit for rash on neck.  Rash: She noticed the rash on her neck 4 days ago.  It is on both sides and the posterior neck.  It is very itchy.  She thinks she is reacting to something and the only 2 thing she can think of is a necklace she has been wearing for over a month or a new hairpiece.  She tried applying over-the-counter cream and it has helped minimally.  The rash is not spreading.  There are no other new products or unusual contacts that could have caused it.  Nausea: She continues to experience nausea.  She typically experiences it in the evening before eating dinner.  She is the Zofran as needed and needs a refill.  She denies any heartburn symptoms.  She denies abdominal pain.     Social History   Socioeconomic History  . Marital status: Single    Spouse name: Not on file  . Number of children: 2  . Years of education: Not on file  . Highest education level: Not on file  Occupational History  . Not on file  Social Needs  . Financial resource strain: Not hard at all  . Food insecurity    Worry: Never true    Inability: Never true  . Transportation needs    Medical: No    Non-medical: No  Tobacco Use  . Smoking status: Current Some Day Smoker    Packs/day: 0.25    Years: 20.00    Pack years: 5.00    Types: Cigarettes  . Smokeless tobacco: Never Used  Substance and Sexual Activity  . Alcohol use: Yes    Alcohol/week: 0.0 standard drinks   Comment: 1/2 pint daily  . Drug use: No  . Sexual activity: Yes    Partners: Male  Lifestyle  . Physical activity    Days per week: 0 days    Minutes per session: 0 min  . Stress: To some extent  Relationships  . Social connections    Talks on phone: More than three times a week    Gets together: More than three times a week    Attends religious service: Not on file    Active member of club or organization: Not on file    Attends meetings of clubs or organizations: Not on file    Relationship status: Not on file  Other Topics Concern  . Not on file  Social History Narrative  . Not on file     Observations/Objective: Appears well in NAD Maculopapular rash bilateral sides of neck and posterior neck  Assessment and Plan:  See Problem List for Assessment and Plan of chronic medical problems.   Follow Up Instructions:    I discussed the assessment and treatment plan with the patient. The patient was provided an opportunity to ask questions and all were answered. The patient agreed with the plan and demonstrated an understanding of the instructions.   The patient was  advised to call back or seek an in-person evaluation if the symptoms worsen or if the condition fails to improve as anticipated.    [name], MD

## 2018-09-17 ENCOUNTER — Encounter: Payer: Self-pay | Admitting: Internal Medicine

## 2018-09-17 ENCOUNTER — Ambulatory Visit (INDEPENDENT_AMBULATORY_CARE_PROVIDER_SITE_OTHER): Payer: Medicare Other | Admitting: Internal Medicine

## 2018-09-17 DIAGNOSIS — R110 Nausea: Secondary | ICD-10-CM | POA: Diagnosis not present

## 2018-09-17 DIAGNOSIS — L259 Unspecified contact dermatitis, unspecified cause: Secondary | ICD-10-CM | POA: Diagnosis not present

## 2018-09-17 MED ORDER — TRIAMCINOLONE ACETONIDE 0.5 % EX CREA: 1.0000 "application " | TOPICAL_CREAM | Freq: Three times a day (TID) | CUTANEOUS | 0 refills | Status: DC

## 2018-09-17 MED ORDER — OMEPRAZOLE 40 MG PO CPDR: 40.0000 mg | DELAYED_RELEASE_CAPSULE | Freq: Every day | ORAL | 5 refills | Status: DC

## 2018-09-17 MED ORDER — ONDANSETRON HCL 4 MG PO TABS: 4.0000 mg | ORAL_TABLET | Freq: Four times a day (QID) | ORAL | 3 refills | Status: DC | PRN

## 2018-09-17 NOTE — Assessment & Plan Note (Signed)
Rash consistent with contact dermatitis-either from her necklace or hair piece Start triamcinolone cream 0.5% 3 times daily She will call if there is no improvement

## 2018-09-17 NOTE — Assessment & Plan Note (Signed)
She has been experiencing consistent nausea that occurs on a daily basis-often before eating dinner No GERD symptoms, but discussed this could be a symptom of GERD Start omeprazole 40 mg daily Zofran as needed Ideally would like to taper her off of omeprazole, but want her to take this daily to see if this nausea is truly GERD

## 2018-09-29 ENCOUNTER — Telehealth: Payer: Self-pay | Admitting: Internal Medicine

## 2018-09-29 NOTE — Telephone Encounter (Signed)
Patient has dropped off a renewal parking placard.  Form has been completed & Placed in providers box to review and sign if she approves.

## 2018-09-30 NOTE — Telephone Encounter (Signed)
Form has been signed, Copy sent to scan.  Patient informed and original mailed to patient as requested.

## 2018-10-03 DIAGNOSIS — M545 Low back pain: Secondary | ICD-10-CM | POA: Diagnosis not present

## 2018-10-03 DIAGNOSIS — Z79891 Long term (current) use of opiate analgesic: Secondary | ICD-10-CM | POA: Diagnosis not present

## 2018-10-03 DIAGNOSIS — G894 Chronic pain syndrome: Secondary | ICD-10-CM | POA: Diagnosis not present

## 2018-10-27 ENCOUNTER — Ambulatory Visit (INDEPENDENT_AMBULATORY_CARE_PROVIDER_SITE_OTHER): Payer: Medicare Other

## 2018-10-27 ENCOUNTER — Other Ambulatory Visit: Payer: Self-pay | Admitting: Internal Medicine

## 2018-10-27 DIAGNOSIS — E538 Deficiency of other specified B group vitamins: Secondary | ICD-10-CM | POA: Diagnosis not present

## 2018-10-27 MED ORDER — CYANOCOBALAMIN 1000 MCG/ML IJ SOLN
1000.0000 ug | Freq: Once | INTRAMUSCULAR | Status: AC
  Administered 2018-10-27: 1000 ug via INTRAMUSCULAR

## 2018-10-27 NOTE — Progress Notes (Signed)
b12 Injection given.   Misty [name] [name], MD  

## 2018-10-30 DIAGNOSIS — G894 Chronic pain syndrome: Secondary | ICD-10-CM | POA: Diagnosis not present

## 2018-10-30 DIAGNOSIS — Z79891 Long term (current) use of opiate analgesic: Secondary | ICD-10-CM | POA: Diagnosis not present

## 2018-10-30 DIAGNOSIS — M545 Low back pain: Secondary | ICD-10-CM | POA: Diagnosis not present

## 2018-11-24 NOTE — Progress Notes (Signed)
Subjective:    Patient ID: Misty [name], female    DOB: Feb 27, 1961, 58 y.o.   MRN: [id number]  HPI The patient is here for an acute visit.   Right shoulder pain:  She has done PT.  She re-injured her right shoulder again 3 weeks ago when she fell.  She can not pull herself up the stairs with her shoulder pain.  She has decreased ROM.     Right hip pain:  She is following with orthopedics.  She sees him soon and hopes she can get another injection.  She is taking meloxicam and using voltaren gel.  The pain is limiting her.   She has had many falls coming down the stairs in her apartment.  She has difficulty getting up the stairs.  She needs to move to a single level apartment - she will need a form filled out.  She has knee pain, hip, back pain and shoulder pain.    Hypertension: She is taking her medication daily. She is compliant with a low sodium diet.  She denies chest pain, palpitations, shortness of breath and regular headaches.   Prediabetes:  She is compliant with a low sugar/carbohydrate diet.  She is not exercising regularly.  Anemia:  She has been eating more ice over the past 2 months.  She is worried about anemia.  She has a history of colon cancer.    Medications and allergies reviewed with patient and updated if appropriate.  Patient Active Problem List   Diagnosis Date Noted  . Contact dermatitis 09/17/2018  . Nausea 09/17/2018  . Right groin pain 07/16/2018  . Rash and nonspecific skin eruption 04/16/2018  . Depression 03/03/2018  . Right shoulder pain 03/03/2018  . Physical deconditioning 03/03/2018  . Pancreatitis 02/22/2018  . Alcohol abuse 02/22/2018  . Cholelithiasis 02/22/2018  . Adhesive capsulitis of right shoulder 01/20/2018  . Ventral hernia 09/16/2017  . Greater trochanteric bursitis of right hip 04/30/2017  . Prediabetes 04/16/2017  . Numbness 01/29/2017  . Chronic low back pain 07/20/2016  . Sacral mass 07/20/2016  . Unilateral primary  osteoarthritis, right hip 07/20/2016  . Spinal stenosis of lumbar region 11/29/2015  . Positive urine drug screen 11/16/2015  . Chronic pain syndrome 10/12/2015  . Umbilical hernia 75/17/0017  . Sleeping difficulties 07/15/2015  . Bunion, right 06/20/2015  . Callus of foot 04/27/2015  . Muscle spasm 04/16/2015  . B12 deficiency 03/30/2015  . Hammer toe of left foot 02/16/2015  . Fibrosis of skin of lower extremity 02/16/2015  . Status post right foot surgery 10/26/2014  . Cervical spondylosis with radiculopathy 03/01/2014  . Hammer toe of right foot 01/26/2014  . Benign intracranial hypertension 06/18/2013  . Abnormal gait 04/21/2013  . Disturbance of skin sensation 04/21/2013  . Cephalalgia 04/21/2013  . Porokeratosis 03/31/2013  . Vitamin D deficiency 04/18/2010  . Low back pain 11/29/2008  . MEDIAL MENISCUS TEAR, LEFT 06/30/2008  . BARRETTS ESOPHAGUS 06/28/2008  . OSTEOARTHRITIS, KNEES, BILATERAL 06/28/2008  . OBESITY 05/24/2008  . HYPERTENSION, BENIGN ESSENTIAL 05/24/2008  . Irritable bowel syndrome 05/24/2008  . BARIATRIC SURGERY STATUS 03/19/2006  . NEOPLASM, MALIGNANT, COLON, HX OF 03/19/1997    Current Outpatient Medications on File Prior to Visit  Medication Sig Dispense Refill  . gabapentin (NEURONTIN) 300 MG capsule One po qAM, two po q PM and 2 po q HS 150 capsule 5  . lisinopril (ZESTRIL) 20 MG tablet Take 1 tablet by mouth once daily 90 tablet 0  .  mirtazapine (REMERON) 15 MG tablet Take 1 tablet (15 mg total) by mouth at bedtime. 30 tablet 5  . NARCAN 4 MG/0.1ML LIQD nasal spray kit SPRAY 0.1 ML (4MG) IN 1 NOSTRIL BY INTRANASAL ROUTE. MAY REPEAT DOSE EVERY 2 TO 3 MINUTES AS NEEDED ALTERNATING NOSTRILS WITH EACH DOSE    . omeprazole (PRILOSEC) 40 MG capsule Take 1 capsule (40 mg total) by mouth daily. 30 capsule 5  . ondansetron (ZOFRAN) 4 MG tablet Take 1 tablet (4 mg total) by mouth every 6 (six) hours as needed for nausea. 20 tablet 3  . Oxycodone HCl 10 MG  TABS Take 10 mg by mouth 5 (five) times daily.     . Syringe/Needle, Disp, (SYRINGE 3CC/25GX1") 25G X 1" 3 ML MISC Use for monthly B12 injections 3 each 3  . triamcinolone cream (KENALOG) 0.5 % APPLY 1 APPLICATION TOPICALLY 3 TIMES DAILY 30 g 0  . VENTOLIN HFA 108 (90 Base) MCG/ACT inhaler     . XTAMPZA ER 13.5 MG C12A      No current facility-administered medications on file prior to visit.     Past Medical History:  Diagnosis Date  . Allergy   . Arthritis   . Back pain   . Colon cancer ([place])   . Depression   . Diarrhea   . GERD (gastroesophageal reflux disease)   . Hypertension   . Insomnia   . MS (multiple sclerosis) ([place])   . Other hammer toe (acquired) 03/31/2013  . Pneumonia    hx of  . Umbilical hernia     Past Surgical History:  Procedure Laterality Date  . ABDOMINAL HYSTERECTOMY    . ANTERIOR CERVICAL DECOMP/DISCECTOMY FUSION N/A 03/01/2014   Procedure: ANTERIOR CERVICAL DECOMPRESSION/DISCECTOMY FUSION 1 LEVEL;  Surgeon: Charlie Pitter, MD;  Location: [facility];  Service: Neurosurgery;  Laterality: N/A;  ANTERIOR CERVICAL DECOMPRESSION/DISCECTOMY FUSION 1 LEVEL  . BUNIONECTOMY Right 10/14/2014   _0   . CESAREAN SECTION    . COLON SURGERY    . COLONOSCOPY    . FOOT SURGERY Right   . GASTRIC BYPASS    . Hammer Toe Repair Right 10/14/2014   RT #2, _1   . TENOTOMY Right 10/14/2014   RT #3, _2     Social History   Socioeconomic History  . Marital status: Single    Spouse name: Not on file  . Number of children: 2  . Years of education: Not on file  . Highest education level: Not on file  Occupational History  . Not on file  Social Needs  . Financial resource strain: Not hard at all  . Food insecurity    Worry: Never true    Inability: Never true  . Transportation needs    Medical: No    Non-medical: No  Tobacco Use  . Smoking status: Current Some Day Smoker    Packs/day: 0.25    Years: 20.00    Pack years: 5.00    Types: Cigarettes  . Smokeless  tobacco: Never Used  Substance and Sexual Activity  . Alcohol use: Yes    Alcohol/week: 0.0 standard drinks    Comment: 1/2 pint daily  . Drug use: No  . Sexual activity: Yes    Partners: Male  Lifestyle  . Physical activity    Days per week: 0 days    Minutes per session: 0 min  . Stress: To some extent  Relationships  . Social connections    Talks on phone: More than three times  a week    Gets together: More than three times a week    Attends religious service: Not on file    Active member of club or organization: Not on file    Attends meetings of clubs or organizations: Not on file    Relationship status: Not on file  Other Topics Concern  . Not on file  Social History Narrative  . Not on file    Family History  Problem Relation Age of Onset  . Stroke Mother   . Hypertension Mother   . Hyperlipidemia Mother   . Diabetes Mother   . Diabetes Brother   . Hypertension Brother   . Hypertension Brother   . Hypertension Brother   . Hypertension Brother   . Hypertension Brother   . Alcoholism Brother     Review of Systems  Constitutional: Negative for chills and fever.  Respiratory: Negative for cough, shortness of breath and wheezing.   Cardiovascular: Positive for leg swelling (occasional). Negative for chest pain and palpitations.  Gastrointestinal: Negative for blood in stool (no black stool).  Musculoskeletal: Positive for arthralgias and back pain.  Neurological: Negative for light-headedness and headaches.       Objective:   Vitals:   11/25/18 0841  BP: 110/64  Pulse: 98  Resp: 18  Temp: 98.2 F (36.8 C)  SpO2: 97%   BP Readings from Last 3 Encounters:  11/25/18 110/64  04/16/18 114/66  03/03/18 124/78   Wt Readings from Last 3 Encounters:  11/25/18 263 lb 12.8 oz (119.7 kg)  08/26/18 264 lb (119.7 kg)  04/16/18 241 lb (109.3 kg)   Body mass index is 44.58 kg/m.   Physical Exam         Assessment & Plan:    See Problem List for  Assessment and Plan of chronic medical problems.

## 2018-11-25 ENCOUNTER — Ambulatory Visit (INDEPENDENT_AMBULATORY_CARE_PROVIDER_SITE_OTHER): Payer: Medicare Other | Admitting: Internal Medicine

## 2018-11-25 ENCOUNTER — Encounter: Payer: Self-pay | Admitting: Internal Medicine

## 2018-11-25 ENCOUNTER — Other Ambulatory Visit: Payer: Self-pay

## 2018-11-25 ENCOUNTER — Other Ambulatory Visit (INDEPENDENT_AMBULATORY_CARE_PROVIDER_SITE_OTHER): Payer: Medicare Other

## 2018-11-25 VITALS — BP 110/64 | HR 98 | Temp 98.2°F | Resp 18 | Ht 64.5 in | Wt 263.8 lb

## 2018-11-25 DIAGNOSIS — E782 Mixed hyperlipidemia: Secondary | ICD-10-CM | POA: Diagnosis not present

## 2018-11-25 DIAGNOSIS — R7303 Prediabetes: Secondary | ICD-10-CM

## 2018-11-25 DIAGNOSIS — D649 Anemia, unspecified: Secondary | ICD-10-CM

## 2018-11-25 DIAGNOSIS — M545 Low back pain, unspecified: Secondary | ICD-10-CM

## 2018-11-25 DIAGNOSIS — Z23 Encounter for immunization: Secondary | ICD-10-CM

## 2018-11-25 DIAGNOSIS — I10 Essential (primary) hypertension: Secondary | ICD-10-CM

## 2018-11-25 DIAGNOSIS — M25511 Pain in right shoulder: Secondary | ICD-10-CM | POA: Diagnosis not present

## 2018-11-25 DIAGNOSIS — G8929 Other chronic pain: Secondary | ICD-10-CM

## 2018-11-25 DIAGNOSIS — E538 Deficiency of other specified B group vitamins: Secondary | ICD-10-CM

## 2018-11-25 LAB — CBC WITH DIFFERENTIAL/PLATELET
Basophils Absolute: 0.1 10*3/uL (ref 0.0–0.1)
Basophils Relative: 1.2 % (ref 0.0–3.0)
Eosinophils Absolute: 0.4 10*3/uL (ref 0.0–0.7)
Eosinophils Relative: 5.9 % — ABNORMAL HIGH (ref 0.0–5.0)
HCT: 40.6 % (ref 36.0–46.0)
Hemoglobin: 13.1 g/dL (ref 12.0–15.0)
Lymphocytes Relative: 23.9 % (ref 12.0–46.0)
Lymphs Abs: 1.6 10*3/uL (ref 0.7–4.0)
MCHC: 32.3 g/dL (ref 30.0–36.0)
MCV: 95.6 fl (ref 78.0–100.0)
Monocytes Absolute: 0.5 10*3/uL (ref 0.1–1.0)
Monocytes Relative: 8.1 % (ref 3.0–12.0)
Neutro Abs: 4.1 10*3/uL (ref 1.4–7.7)
Neutrophils Relative %: 60.9 % (ref 43.0–77.0)
Platelets: 205 10*3/uL (ref 150.0–400.0)
RBC: 4.25 Mil/uL (ref 3.87–5.11)
RDW: 13.9 % (ref 11.5–15.5)
WBC: 6.7 10*3/uL (ref 4.0–10.5)

## 2018-11-25 LAB — COMPREHENSIVE METABOLIC PANEL
ALT: 19 U/L (ref 0–35)
AST: 28 U/L (ref 0–37)
Albumin: 4 g/dL (ref 3.5–5.2)
Alkaline Phosphatase: 129 U/L — ABNORMAL HIGH (ref 39–117)
BUN: 23 mg/dL (ref 6–23)
CO2: 27 mEq/L (ref 19–32)
Calcium: 9.9 mg/dL (ref 8.4–10.5)
Chloride: 103 mEq/L (ref 96–112)
Creatinine, Ser: 1.23 mg/dL — ABNORMAL HIGH (ref 0.40–1.20)
GFR: 54.33 mL/min — ABNORMAL LOW (ref >60.00)
Glucose, Bld: 104 mg/dL — ABNORMAL HIGH (ref 70–99)
Potassium: 4.7 mEq/L (ref 3.5–5.1)
Sodium: 137 mEq/L (ref 135–145)
Total Bilirubin: 0.5 mg/dL (ref 0.2–1.2)
Total Protein: 7.9 g/dL (ref 6.0–8.3)

## 2018-11-25 LAB — LIPID PANEL
Cholesterol: 248 mg/dL — ABNORMAL HIGH (ref 0–200)
HDL: 57.1 mg/dL (ref >39.00)
LDL Cholesterol: 158 mg/dL — ABNORMAL HIGH (ref 0–99)
NonHDL: 190.47
Total CHOL/HDL Ratio: 4
Triglycerides: 161 mg/dL — ABNORMAL HIGH (ref 0.0–149.0)
VLDL: 32.2 mg/dL (ref 0.0–40.0)

## 2018-11-25 LAB — TSH: TSH: 1.35 u[IU]/mL (ref 0.35–4.50)

## 2018-11-25 LAB — HEMOGLOBIN A1C: Hgb A1c MFr Bld: 5.6 % (ref 4.6–6.5)

## 2018-11-25 MED ORDER — DICLOFENAC SODIUM 1 % TD GEL: 2.0000 g | Freq: Four times a day (QID) | TRANSDERMAL | 5 refills | Status: DC

## 2018-11-25 MED ORDER — HYDROCHLOROTHIAZIDE 12.5 MG PO CAPS: 12.5000 mg | ORAL_CAPSULE | Freq: Every day | ORAL | 0 refills | Status: DC | PRN

## 2018-11-25 MED ORDER — CYANOCOBALAMIN 1000 MCG/ML IJ SOLN
1000.0000 ug | Freq: Once | INTRAMUSCULAR | Status: AC
  Administered 2018-11-25: 1000 ug via INTRAMUSCULAR

## 2018-11-25 MED ORDER — MELOXICAM 15 MG PO TABS: ORAL_TABLET | ORAL | 1 refills | Status: DC

## 2018-11-25 NOTE — Assessment & Plan Note (Signed)
Has been eating more ice - concern for iron def H/o colon cancer Cbc, iron panel

## 2018-11-25 NOTE — Assessment & Plan Note (Signed)
Chronic lower back pain Following with pain management Will refer to ortho

## 2018-11-25 NOTE — Patient Instructions (Addendum)
  Tests ordered today. Your results will be released to [name] (or called to you) after review.  If any changes need to be made, you will be notified at that same time.  Flu immunization administered today.  You had your B12 injection today.   Medications reviewed and updated.  Changes include :   Take hctz as needed only.    Your prescription(s) have been submitted to your pharmacy. Please take as directed and contact our office if you believe you are having problem(s) with the medication(s).  A referral was ordered for [facility] for your back pain and shoulder pain.    Please followup in 6 months

## 2018-11-25 NOTE — Assessment & Plan Note (Signed)
Check a1c Low sugar / carb diet Stressed regular exercise   

## 2018-11-25 NOTE — Assessment & Plan Note (Addendum)
BP well controlled-has not been taking hydrochlorothiazide on a daily basis and we will change this to as needed for leg swelling or elevated blood pressure Current regimen effective and well tolerated Continue current medications at current doses cmp

## 2018-11-25 NOTE — Assessment & Plan Note (Signed)
Pain since fall 3 weeks ago referred to ortho

## 2018-11-25 NOTE — Assessment & Plan Note (Signed)
B12 injection today 

## 2018-11-26 LAB — IRON,TIBC AND FERRITIN PANEL
%SAT: 21 % (calc) (ref 16–45)
Ferritin: 48 ng/mL (ref 16–232)
Iron: 75 ug/dL (ref 45–160)
TIBC: 354 mcg/dL (calc) (ref 250–450)

## 2018-11-27 ENCOUNTER — Encounter: Payer: Self-pay | Admitting: Orthopaedic Surgery

## 2018-11-27 ENCOUNTER — Encounter: Payer: Self-pay | Admitting: Internal Medicine

## 2018-11-27 ENCOUNTER — Ambulatory Visit (INDEPENDENT_AMBULATORY_CARE_PROVIDER_SITE_OTHER): Payer: Medicare Other

## 2018-11-27 ENCOUNTER — Ambulatory Visit: Payer: Medicare Other

## 2018-11-27 ENCOUNTER — Ambulatory Visit (INDEPENDENT_AMBULATORY_CARE_PROVIDER_SITE_OTHER): Payer: Medicare Other | Admitting: Orthopaedic Surgery

## 2018-11-27 VITALS — Ht 66.5 in | Wt 268.0 lb

## 2018-11-27 DIAGNOSIS — G8929 Other chronic pain: Secondary | ICD-10-CM

## 2018-11-27 DIAGNOSIS — M25511 Pain in right shoulder: Secondary | ICD-10-CM | POA: Diagnosis not present

## 2018-11-27 DIAGNOSIS — M898X6 Other specified disorders of bone, lower leg: Secondary | ICD-10-CM

## 2018-11-27 DIAGNOSIS — M1611 Unilateral primary osteoarthritis, right hip: Secondary | ICD-10-CM | POA: Diagnosis not present

## 2018-11-27 DIAGNOSIS — E785 Hyperlipidemia, unspecified: Secondary | ICD-10-CM | POA: Insufficient documentation

## 2018-11-27 NOTE — Progress Notes (Addendum)
Subjective: She is here for ultrasound-guided right AC joint and right hip injections.  AC joint  Objective: Tender to palpation of the right AC joint and pain with AC crossover test.  Pain in the right hip with passive internal rotation.  Procedure: Ultrasound-guided right shoulder and hip injections: After sterile prep with Betadine, injected 3 cc 1% lidocaine without epinephrine and 40 mg methylprednisolone into the [facility] joint, and 8 cc 1% lidocaine without epinephrine and 40 mg methylprednisolone into the femoral head/neck junction of the right hip.  Injectate was seen filling both joint capsules.  She had good immediate pain relief.

## 2018-11-27 NOTE — Progress Notes (Signed)
Office Visit Note   Patient: Misty [name]           Date of Birth: 1960-03-23           MRN: [id number] Visit Date: 11/27/2018              Requested by: [name], MD [place],   [id number] PCP: [name], MD   Assessment & Plan: Visit Diagnoses:  1. Chronic right shoulder pain   2. Pain of right tibia   3. Unilateral primary osteoarthritis, right hip     Plan: Impression is #1 right hip osteoarthritis, #2 AC joint arthropathy and #3 tibia contusion.  In regards to the right hip and AC joint, we will refer her to Dr. Junius Roads for ultrasound-guided cortisone injections.  She is aware that she may need a right hip replacement in the future.  She currently has a BMI of 42 and will need to make all efforts at weight loss prior to surgical intervention.  She will follow-up with [name] as needed.  Follow-Up Instructions: Return if symptoms worsen or fail to improve.   Orders:  Orders Placed This Encounter  Procedures  . XR Shoulder Right  . XR Tibia/Fibula Left   No orders of the defined types were placed in this encounter.     Procedures: No procedures performed   Clinical Data: No additional findings.   Subjective: Chief Complaint  Patient presents with  . Right Shoulder - Pain  . Right Hip - Pain    HPI patient is a pleasant 58 year old female who presents our clinic today with recurrent right hip pain as well as new onset right shoulder and right tibia pain.  In regards to the right hip, history of osteoarthritis.  She was seen injected with cortisone by Dr. Junius Roads on 08/26/2018.  She had significant relief of symptoms but only lasted for approximately 3 weeks.  She notes during that period of time she did a lot of physical work as she felt fantastic.  Her pain has returned and is started to worsen.  She is now using a cane to help with ambulation.  She notes that she did have a mechanical fall about 1 month ago where she fell on her right knee and  right shoulder.  She has had increased pain to both areas since.  In regards to the right shoulder, the majority of her pain is to the [facility] joint but she does have pain radiating into the deltoid as well.  Pain is worse with internal rotation and abduction of the shoulder.  No complains of weakness.  No numbness, tingling or burning.  In regards to the right tibia, the pain is primarily over the tibial tubercle.  Worse with weightbearing.  Review of Systems as detailed in HPI.  All others reviewed and are negative.   Objective: Vital Signs: Ht 5' 6.5" (1.689 m)   Wt 268 lb (121.6 kg)   BMI 42.61 kg/m   Physical Exam well-developed well-nourished female no acute distress.  Alert and oriented x3.  Ortho Exam examination of the right hip reveals a positive logroll.  Negative straight leg raise.  No focal weakness.  Right tibia has marked tenderness over the tibial tubercle.  Right shoulder exam shows full forward flexion and external rotation.  She has very limited internal rotation where she cannot quite make it to her back pocket.  Positive empty can and cross body adduction.  Significant tenderness over the  AC joint.  She is neurovascularly intact distally.  Specialty Comments:  No specialty comments available.  Imaging: Xr Tibia/fibula Left  Result Date: 11/27/2018 No acute or structural abnormalities  Xr Shoulder Right  Result Date: 11/27/2018 Moderate tenderness to the before meals and glenohumeral joints with associated spurring    PMFS History: Patient Active Problem List   Diagnosis Date Noted  . Hyperlipidemia 11/27/2018  . Anemia 11/25/2018  . Contact dermatitis 09/17/2018  . Nausea 09/17/2018  . Right groin pain 07/16/2018  . Rash and nonspecific skin eruption 04/16/2018  . Depression 03/03/2018  . Right shoulder pain 03/03/2018  . Physical deconditioning 03/03/2018  . Pancreatitis 02/22/2018  . Alcohol abuse 02/22/2018  . Cholelithiasis 02/22/2018  . Adhesive  capsulitis of right shoulder 01/20/2018  . Ventral hernia 09/16/2017  . Greater trochanteric bursitis of right hip 04/30/2017  . Prediabetes 04/16/2017  . Numbness 01/29/2017  . Chronic low back pain 07/20/2016  . Sacral mass 07/20/2016  . Unilateral primary osteoarthritis, right hip 07/20/2016  . Spinal stenosis of lumbar region 11/29/2015  . Positive urine drug screen 11/16/2015  . Chronic pain syndrome 10/12/2015  . Umbilical hernia [id number]  . Sleeping difficulties 07/15/2015  . Bunion, right 06/20/2015  . Callus of foot 04/27/2015  . Muscle spasm 04/16/2015  . B12 deficiency 03/30/2015  . Hammer toe of left foot 02/16/2015  . Fibrosis of skin of lower extremity 02/16/2015  . Status post right foot surgery 10/26/2014  . Cervical spondylosis with radiculopathy 03/01/2014  . Hammer toe of right foot 01/26/2014  . Benign intracranial hypertension 06/18/2013  . Abnormal gait 04/21/2013  . Disturbance of skin sensation 04/21/2013  . Cephalalgia 04/21/2013  . Porokeratosis 03/31/2013  . Vitamin D deficiency 04/18/2010  . Low back pain 11/29/2008  . MEDIAL MENISCUS TEAR, LEFT 06/30/2008  . BARRETTS ESOPHAGUS 06/28/2008  . OSTEOARTHRITIS, KNEES, BILATERAL 06/28/2008  . OBESITY 05/24/2008  . HYPERTENSION, BENIGN ESSENTIAL 05/24/2008  . Irritable bowel syndrome 05/24/2008  . BARIATRIC SURGERY STATUS 03/19/2006  . NEOPLASM, MALIGNANT, COLON, HX OF 03/19/1997   Past Medical History:  Diagnosis Date  . Allergy   . Arthritis   . Back pain   . Colon cancer ([place])   . Depression   . Diarrhea   . GERD (gastroesophageal reflux disease)   . Hypertension   . Insomnia   . MS (multiple sclerosis) ([place])   . Other hammer toe (acquired) 03/31/2013  . Pneumonia    hx of  . Umbilical hernia     Family History  Problem Relation Age of Onset  . Stroke Mother   . Hypertension Mother   . Hyperlipidemia Mother   . Diabetes Mother   . Diabetes Brother   . Hypertension Brother   .  Hypertension Brother   . Hypertension Brother   . Hypertension Brother   . Hypertension Brother   . Alcoholism Brother     Past Surgical History:  Procedure Laterality Date  . ABDOMINAL HYSTERECTOMY    . ANTERIOR CERVICAL DECOMP/DISCECTOMY FUSION N/A 03/01/2014   Procedure: ANTERIOR CERVICAL DECOMPRESSION/DISCECTOMY FUSION 1 LEVEL;  Surgeon: Charlie Pitter, MD;  Location: [facility];  Service: Neurosurgery;  Laterality: N/A;  ANTERIOR CERVICAL DECOMPRESSION/DISCECTOMY FUSION 1 LEVEL  . BUNIONECTOMY Right 10/14/2014   @[facility]   . CESAREAN SECTION    . COLON SURGERY    . COLONOSCOPY    . FOOT SURGERY Right   . GASTRIC BYPASS    . Hammer Toe Repair Right 10/14/2014   RT #  2, @[facility]   . TENOTOMY Right 10/14/2014   RT #3, @[facility]    Social History   Occupational History  . Not on file  Tobacco Use  . Smoking status: Current Some Day Smoker    Packs/day: 0.25    Years: 20.00    Pack years: 5.00    Types: Cigarettes  . Smokeless tobacco: Never Used  Substance and Sexual Activity  . Alcohol use: Yes    Alcohol/week: 0.0 standard drinks    Comment: 1/2 pint daily  . Drug use: No  . Sexual activity: Yes    Partners: Male

## 2018-12-01 DIAGNOSIS — M545 Low back pain: Secondary | ICD-10-CM | POA: Diagnosis not present

## 2018-12-01 DIAGNOSIS — Z79891 Long term (current) use of opiate analgesic: Secondary | ICD-10-CM | POA: Diagnosis not present

## 2018-12-01 DIAGNOSIS — G894 Chronic pain syndrome: Secondary | ICD-10-CM | POA: Diagnosis not present

## 2018-12-04 DIAGNOSIS — Z85038 Personal history of other malignant neoplasm of large intestine: Secondary | ICD-10-CM | POA: Diagnosis not present

## 2018-12-04 DIAGNOSIS — K859 Acute pancreatitis without necrosis or infection, unspecified: Secondary | ICD-10-CM | POA: Diagnosis not present

## 2018-12-22 ENCOUNTER — Ambulatory Visit: Payer: Medicare Other

## 2018-12-22 NOTE — Progress Notes (Unsigned)
Subjective:   Misty [name] is a 59 y.o. female who presents for Medicare Annual (Subsequent) preventive examination. I connected with patient by a telephone and verified that I am speaking with the correct person using two identifiers. Patient stated full name and DOB. Patient gave permission to continue with telephonic visit. Patient's location was at home and Nurse's location was at [place] office.  Review of Systems:     Sleep patterns: {SX; SLEEP PATTERNS:18802::"feels rested on waking","does not get up to void","gets up *** times nightly to void","sleeps *** hours nightly"}.    Home Safety/Smoke Alarms: Feels safe in home. Smoke alarms in place.  Living environment; residence and Firearm Safety: {Rehab home environment / accessibility:30080::"no firearms","firearms stored safely"}. Seat Belt Safety/Bike Helmet: Wears seat belt.      Objective:     Vitals: There were no vitals taken for this visit.  There is no height or weight on file to calculate BMI.  Advanced Directives 02/23/2018 02/22/2018 10/15/2017 12/18/2016 06/22/2016 03/06/2016 03/01/2016  Does Patient Have a Medical Advance Directive? - No No No No - No  Would patient like information on creating a medical advance directive? No - Patient declined - No - Patient declined No - Patient declined - No - Patient declined No - Patient declined    Tobacco Social History   Tobacco Use  Smoking Status Current Some Day Smoker  . Packs/day: 0.25  . Years: 20.00  . Pack years: 5.00  . Types: Cigarettes  Smokeless Tobacco Never Used     Ready to quit: Not Answered Counseling given: Not Answered  Past Medical History:  Diagnosis Date  . Allergy   . Arthritis   . Back pain   . Colon cancer ([place])   . Depression   . Diarrhea   . GERD (gastroesophageal reflux disease)   . Hypertension   . Insomnia   . MS (multiple sclerosis) ([place])   . Other hammer toe (acquired) 03/31/2013  . Pneumonia    hx of  . Umbilical hernia     Past Surgical History:  Procedure Laterality Date  . ABDOMINAL HYSTERECTOMY    . ANTERIOR CERVICAL DECOMP/DISCECTOMY FUSION N/A 03/01/2014   Procedure: ANTERIOR CERVICAL DECOMPRESSION/DISCECTOMY FUSION 1 LEVEL;  Surgeon: Charlie Pitter, MD;  Location: [facility];  Service: Neurosurgery;  Laterality: N/A;  ANTERIOR CERVICAL DECOMPRESSION/DISCECTOMY FUSION 1 LEVEL  . BUNIONECTOMY Right 10/14/2014   @[facility]   . CESAREAN SECTION    . COLON SURGERY    . COLONOSCOPY    . FOOT SURGERY Right   . GASTRIC BYPASS    . Hammer Toe Repair Right 10/14/2014   RT #2, @[facility]   . TENOTOMY Right 10/14/2014   RT #3, @[facility]    Family History  Problem Relation Age of Onset  . Stroke Mother   . Hypertension Mother   . Hyperlipidemia Mother   . Diabetes Mother   . Diabetes Brother   . Hypertension Brother   . Hypertension Brother   . Hypertension Brother   . Hypertension Brother   . Hypertension Brother   . Alcoholism Brother    Social History   Socioeconomic History  . Marital status: Single    Spouse name: Not on file  . Number of children: 2  . Years of education: Not on file  . Highest education level: Not on file  Occupational History  . Not on file  Social Needs  . Financial resource strain: Not hard at all  . Food insecurity  Worry: Never true    Inability: Never true  . Transportation needs    Medical: No    Non-medical: No  Tobacco Use  . Smoking status: Current Some Day Smoker    Packs/day: 0.25    Years: 20.00    Pack years: 5.00    Types: Cigarettes  . Smokeless tobacco: Never Used  Substance and Sexual Activity  . Alcohol use: Yes    Alcohol/week: 0.0 standard drinks    Comment: 1/2 pint daily  . Drug use: No  . Sexual activity: Yes    Partners: Male  Lifestyle  . Physical activity    Days per week: 0 days    Minutes per session: 0 min  . Stress: To some extent  Relationships  . Social connections    Talks on phone: More than three times a week    Gets together:  More than three times a week    Attends religious service: Not on file    Active member of club or organization: Not on file    Attends meetings of clubs or organizations: Not on file    Relationship status: Not on file  Other Topics Concern  . Not on file  Social History Narrative  . Not on file    Outpatient Encounter Medications as of 12/22/2018  Medication Sig  . diclofenac sodium (VOLTAREN) 1 % GEL Apply 2 g topically 4 (four) times daily.  Marland Kitchen gabapentin (NEURONTIN) 300 MG capsule One po qAM, two po q PM and 2 po q HS  . hydrochlorothiazide (MICROZIDE) 12.5 MG capsule Take 1 capsule (12.5 mg total) by mouth daily as needed (swelling in legs or elevated BP).  Marland Kitchen lisinopril (ZESTRIL) 20 MG tablet Take 1 tablet by mouth once daily  . meloxicam (MOBIC) 15 MG tablet TAKE 1 TABLET BY MOUTH ONCE DAILY WITH FOOD  . mirtazapine (REMERON) 15 MG tablet Take 1 tablet (15 mg total) by mouth at bedtime.  Marland Kitchen NARCAN 4 MG/0.1ML LIQD nasal spray kit SPRAY 0.1 ML (4MG) IN 1 NOSTRIL BY INTRANASAL ROUTE. MAY REPEAT DOSE EVERY 2 TO 3 MINUTES AS NEEDED ALTERNATING NOSTRILS WITH EACH DOSE  . omeprazole (PRILOSEC) 40 MG capsule Take 1 capsule (40 mg total) by mouth daily.  . ondansetron (ZOFRAN) 4 MG tablet Take 1 tablet (4 mg total) by mouth every 6 (six) hours as needed for nausea.  . Oxycodone HCl 10 MG TABS Take 10 mg by mouth 5 (five) times daily.   . Syringe/Needle, Disp, (SYRINGE 3CC/25GX1") 25G X 1" 3 ML MISC Use for monthly B12 injections  . triamcinolone cream (KENALOG) 0.5 % APPLY 1 APPLICATION TOPICALLY 3 TIMES DAILY  . VENTOLIN HFA 108 (90 Base) MCG/ACT inhaler   . XTAMPZA ER 13.5 MG C12A    No facility-administered encounter medications on file as of 12/22/2018.     Activities of Daily Living In your present state of health, do you have any difficulty performing the following activities: 02/23/2018  Hearing? N  Vision? N  Difficulty concentrating or making decisions? N  Walking or climbing  stairs? Y  Dressing or bathing? Y  Doing errands, shopping? N  Some recent data might be hidden    Patient Care Team: [name], MD as PCP - General (Internal Medicine)    Assessment:   This is a routine wellness examination for [place]. Physical assessment deferred to PCP.   Exercise Activities and Dietary recommendations    Goals    . Patient Stated  I want to work towards getting my pain under control so I think about going back to work.       Fall Risk Fall Risk  10/15/2017 06/22/2016 10/11/2015  Falls in the past year? Yes Yes Yes  Number falls in past yr: 2 or more 2 or more 2 or more  Injury with Fall? - Yes No  Comment - shoulder dislocated -  Risk Factor Category  - High Fall Risk -  Risk for fall due to : Impaired mobility;Impaired balance/gait;History of fall(s) Impaired balance/gait -  Follow up Falls prevention discussed;Education provided Falls prevention discussed;Education provided Falls evaluation completed   Is the patient's home free of loose throw rugs in walkways, pet beds, electrical cords, etc?   {Blank single:19197::"yes","no"}      Grab bars in the bathroom? {Blank single:19197::"yes","no"}      Handrails on the stairs?   {Blank single:19197::"yes","no"}      Adequate lighting?   {Blank single:19197::"yes","no"}  Depression Screen PHQ 2/9 Scores 10/15/2017 07/10/2016 06/22/2016  PHQ - 2 Score 2 0 1  PHQ- 9 Score 10 - 6     Cognitive Function        Immunization History  Administered Date(s) Administered  . Influenza Whole 04/14/2010  . Influenza,inj,Quad PF,6+ Mos 03/01/2014, 01/18/2016, 04/17/2017, 01/13/2018, 11/25/2018  . Influenza-Unspecified 03/16/2015  . PPD Test 08/06/2017   Screening Tests Health Maintenance  Topic Date Due  . TETANUS/TDAP  04/10/1980  . MAMMOGRAM  01/17/2020  . COLONOSCOPY  09/08/2024  . INFLUENZA VACCINE  Completed  . Hepatitis C Screening  Completed  . HIV Screening  Completed      Plan:     I  have personally reviewed and noted the following in the patient's chart:   . Medical and social history . Use of alcohol, tobacco or illicit drugs  . Current medications and supplements . Functional ability and status . Nutritional status . Physical activity . Advanced directives . List of other physicians . Screenings to include cognitive, depression, and falls . Referrals and appointments  In addition, I have reviewed and discussed with patient certain preventive protocols, quality metrics, and best practice recommendations. A written personalized care plan for preventive services as well as general preventive health recommendations were provided to patient.     Michiel Cowboy, RN  12/22/2018

## 2018-12-22 NOTE — Progress Notes (Addendum)
Subjective:   Misty [name] is a 59 y.o. female who presents for Medicare Annual (Subsequent) preventive examination. I connected with patient by a telephone and verified that I am speaking with the correct person using two identifiers. Patient stated full name and DOB. Patient gave permission to continue with telephonic visit. Patient's location was at home and Nurse's location was at [place] office.   Review of Systems:   Cardiac Risk Factors include: advanced age (>47mn, >>49women);dyslipidemia;obesity (BMI >30kg/m2);hypertension Sleep patterns: has interrupted sleep, gets up 1-2 times nightly to void and sleeps 5-7 hours nightly.    Home Safety/Smoke Alarms: Feels safe in home. Smoke alarms in place.  Living environment; residence and Firearm Safety: apartment, number of outside stairs: 17. Lives alone,  Needs a tub-bench and cane for DME, good support system Seat Belt Safety/Bike Helmet: Wears seat belt.      Objective:     Vitals: There were no vitals taken for this visit.  There is no height or weight on file to calculate BMI.  Advanced Directives 12/23/2018 02/23/2018 02/22/2018 10/15/2017 12/18/2016 06/22/2016 03/06/2016  Does Patient Have a Medical Advance Directive? No - No No No No -  Would patient like information on creating a medical advance directive? No - Patient declined No - Patient declined - No - Patient declined No - Patient declined - No - Patient declined    Tobacco Social History   Tobacco Use  Smoking Status Former Smoker  . Packs/day: 0.25  . Years: 20.00  . Pack years: 5.00  . Types: Cigarettes  . Quit date: 09/22/2018  . Years since quitting: 0.2  Smokeless Tobacco Never Used  Tobacco Comment   states she quit 3 months ago     Counseling given: Not Answered Comment: states she quit 3 months ago  Past Medical History:  Diagnosis Date  . Allergy   . Arthritis   . Back pain   . [place]   . Depression   . Diarrhea   . GERD  (gastroesophageal reflux disease)   . Hypertension   . Insomnia   . MS (multiple sclerosis) ([facility]   . Other hammer toe (acquired) 03/31/2013  . Pneumonia    hx of  . Umbilical hernia    Past Surgical History:  Procedure Laterality Date  . ABDOMINAL HYSTERECTOMY    . ANTERIOR CERVICAL DECOMP/DISCECTOMY FUSION N/A 03/01/2014   Procedure: ANTERIOR CERVICAL DECOMPRESSION/DISCECTOMY FUSION 1 LEVEL;  Surgeon: HCharlie Pitter MD;  [facility];  Service: Neurosurgery;  Laterality: N/A;  ANTERIOR CERVICAL DECOMPRESSION/DISCECTOMY FUSION 1 LEVEL  . BUNIONECTOMY Right 10/14/2014   _0   . CESAREAN SECTION    . COLON SURGERY    . COLONOSCOPY    . FOOT SURGERY Right   . GASTRIC BYPASS    . Hammer Toe Repair Right 10/14/2014   RT #2, _1   . TENOTOMY Right 10/14/2014   RT #3, _2    Family History  Problem Relation Age of Onset  . Stroke Mother   . Hypertension Mother   . Hyperlipidemia Mother   . Diabetes Mother   . Diabetes Brother   . Hypertension Brother   . Hypertension Brother   . Hypertension Brother   . Hypertension Brother   . Hypertension Brother   . Alcoholism Brother    Social History   Socioeconomic History  . Marital status: Single    Spouse name: Not on file  . Number of children: 2  . Years of education:  Not on file  . Highest education level: Not on file  Occupational History  . Occupation: Disability  Social Needs  . Financial resource strain: Not hard at all  . Food insecurity    Worry: Never true    Inability: Never true  . Transportation needs    Medical: No    Non-medical: No  Tobacco Use  . Smoking status: Former Smoker    Packs/day: 0.25    Years: 20.00    Pack years: 5.00    Types: Cigarettes    Quit date: 09/22/2018    Years since quitting: 0.2  . Smokeless tobacco: Never Used  . Tobacco comment: states she quit 3 months ago  Substance and Sexual Activity  . Alcohol use: Yes    Alcohol/week: 0.0 standard drinks    Comment: 1/2 pint  daily  . Drug use: No  . Sexual activity: Yes    Partners: Male  Lifestyle  . Physical activity    Days per week: 0 days    Minutes per session: 0 min  . Stress: To some extent  Relationships  . Social connections    Talks on phone: More than three times a week    Gets together: More than three times a week    Attends religious service: Not on file    Active member of club or organization: Not on file    Attends meetings of clubs or organizations: Not on file    Relationship status: Not on file  Other Topics Concern  . Not on file  Social History Narrative  . Not on file    Outpatient Encounter Medications as of 12/23/2018  Medication Sig  . diclofenac sodium (VOLTAREN) 1 % GEL Apply 2 g topically 4 (four) times daily.  Marland Kitchen gabapentin (NEURONTIN) 300 MG capsule One po qAM, two po q PM and 2 po q HS  . hydrochlorothiazide (MICROZIDE) 12.5 MG capsule Take 1 capsule (12.5 mg total) by mouth daily as needed (swelling in legs or elevated BP).  Marland Kitchen lisinopril (ZESTRIL) 20 MG tablet Take 1 tablet by mouth once daily  . meloxicam (MOBIC) 15 MG tablet TAKE 1 TABLET BY MOUTH ONCE DAILY WITH FOOD  . mirtazapine (REMERON) 15 MG tablet Take 1 tablet (15 mg total) by mouth at bedtime.  Marland Kitchen NARCAN 4 MG/0.1ML LIQD nasal spray kit SPRAY 0.1 ML ('4MG'$ ) IN 1 NOSTRIL BY INTRANASAL ROUTE. MAY REPEAT DOSE EVERY 2 TO 3 MINUTES AS NEEDED ALTERNATING NOSTRILS WITH EACH DOSE  . omeprazole (PRILOSEC) 40 MG capsule Take 1 capsule (40 mg total) by mouth daily.  . ondansetron (ZOFRAN) 4 MG tablet Take 1 tablet (4 mg total) by mouth every 6 (six) hours as needed for nausea.  . Oxycodone HCl 10 MG TABS Take 10 mg by mouth 5 (five) times daily.   . Syringe/Needle, Disp, (SYRINGE 3CC/25GX1") 25G X 1" 3 ML MISC Use for monthly B12 injections  . triamcinolone cream (KENALOG) 0.5 % APPLY 1 APPLICATION TOPICALLY 3 TIMES DAILY  . VENTOLIN HFA 108 (90 Base) MCG/ACT inhaler   . XTAMPZA ER 13.5 MG C12A    No  facility-administered encounter medications on file as of 12/23/2018.     Activities of Daily Living In your present state of health, do you have any difficulty performing the following activities: 12/23/2018 02/23/2018  Hearing? N N  Vision? N N  Difficulty concentrating or making decisions? N N  Walking or climbing stairs? Y Y  Dressing or bathing? N Y  Doing  errands, shopping? N N  Preparing Food and eating ? N -  Using the Toilet? N -  In the past six months, have you accidently leaked urine? N -  Do you have problems with loss of bowel control? N -  Managing your Medications? N -  Managing your Finances? N -  Housekeeping or managing your Housekeeping? N -  Some recent data might be hidden    Patient Care Team: [name], MD as PCP - General (Internal Medicine) Gardiner Barefoot, DPM as Consulting Physician (Podiatry)    Assessment:   This is a routine wellness examination for [place]. Physical assessment deferred to PCP.  Exercise Activities and Dietary recommendations Current Exercise Habits: The patient does not participate in regular exercise at present, Exercise limited by: orthopedic condition(s)  Diet (meal preparation, eat out, water intake, caffeinated beverages, dairy products, fruits and vegetables): in general, a "healthy" diet  , on average, 1-2 meals per day Reports poor appetite at times.    Discussed supplementing with Ensure and/or high protein smoothies. Encouraged patient to increase daily water and healthy fluid intake.   Goals    . Patient Stated     Lose weight by watching my diet and increasing my physical activity.       Fall Risk Fall Risk  12/23/2018 10/15/2017 06/22/2016 10/11/2015  Falls in the past year? 1 Yes Yes Yes  Number falls in past yr: 1 2 or more 2 or more 2 or more  Injury with Fall? 0 - Yes No  Comment - - shoulder dislocated -  Risk Factor Category  - - High Fall Risk -  Risk for fall due to : Impaired mobility;Impaired  balance/gait;History of fall(s) Impaired mobility;Impaired balance/gait;History of fall(s) Impaired balance/gait -  Follow up Falls prevention discussed;Education provided Falls prevention discussed;Education provided Falls prevention discussed;Education provided Falls evaluation completed    Depression Screen PHQ 2/9 Scores 12/23/2018 10/15/2017 07/10/2016 06/22/2016  PHQ - 2 Score 2 2 0 1  PHQ- 9 Score 7 10 - 6     Cognitive Function       Ad8 score reviewed for issues:  Issues making decisions: no  Less interest in hobbies / activities: no  Repeats questions, stories (family complaining): no  Trouble using ordinary gadgets (microwave, computer, phone):no  Forgets the month or year: no  Mismanaging finances: no  Remembering appts: no  Daily problems with thinking and/or memory: no Ad8 score is= 0  Immunization History  Administered Date(s) Administered  . Influenza Whole 04/14/2010  . Influenza,inj,Quad PF,6+ Mos 03/01/2014, 01/18/2016, 04/17/2017, 01/13/2018, 11/25/2018  . Influenza-Unspecified 03/16/2015  . PPD Test 08/06/2017   Screening Tests Health Maintenance  Topic Date Due  . TETANUS/TDAP  04/10/1980  . MAMMOGRAM  01/17/2020  . COLONOSCOPY  09/08/2024  . INFLUENZA VACCINE  Completed  . Hepatitis C Screening  Completed  . HIV Screening  Completed        Plan:    Reviewed health maintenance screenings with patient today and relevant education, vaccines, and/or referrals were provided.   I have personally reviewed and noted the following in the patient's chart:   . Medical and social history . Use of alcohol, tobacco or illicit drugs  . Current medications and supplements . Functional ability and status . Nutritional status . Physical activity . Advanced directives . List of other physicians . Screenings to include cognitive, depression, and falls . Referrals and appointments  In addition, I have reviewed and discussed with patient certain  preventive protocols, quality metrics, and best practice recommendations. A written personalized care plan for preventive services as well as general preventive health recommendations were provided to patient.     Michiel Cowboy, RN  12/23/2018    Medical screening examination/treatment/procedure(s) were performed by non-physician practitioner and as supervising physician I was immediately available for consultation/collaboration. I agree with above. [name], MD

## 2018-12-23 ENCOUNTER — Telehealth: Payer: Self-pay | Admitting: *Deleted

## 2018-12-23 ENCOUNTER — Ambulatory Visit (INDEPENDENT_AMBULATORY_CARE_PROVIDER_SITE_OTHER): Payer: Medicare Other | Admitting: *Deleted

## 2018-12-23 DIAGNOSIS — Z0000 Encounter for general adult medical examination without abnormal findings: Secondary | ICD-10-CM

## 2018-12-23 DIAGNOSIS — Z9181 History of falling: Secondary | ICD-10-CM | POA: Diagnosis not present

## 2018-12-23 DIAGNOSIS — R5381 Other malaise: Secondary | ICD-10-CM

## 2018-12-23 DIAGNOSIS — R2689 Other abnormalities of gait and mobility: Secondary | ICD-10-CM

## 2018-12-23 NOTE — Telephone Encounter (Signed)
Referral ordered-they will contact her to set up an appointment

## 2018-12-23 NOTE — Telephone Encounter (Signed)
During AWV, patient requested to have a referral for physical therapy stating she feels like it helped her ambulatory status greatly the last time she went and that physical therapy would help her gain strength and balance.

## 2018-12-23 NOTE — Patient Instructions (Signed)
Continue doing brain stimulating activities (puzzles, reading, adult coloring books, staying active) to keep memory sharp.   Continue to eat heart healthy diet (full of fruits, vegetables, whole grains, lean protein, water--limit salt, fat, and sugar intake) and increase physical activity as tolerated.  If you cannot attend class in person, you can still exercise at home. Video taped versions of AHOY classes are shown on [employer] (GTN) at 8 am and 1 pm Mondays through Fridays. You can also purchase a copy of the AHOY DVD by calling [place] (GTN) [employer]. GTN is available on [employer] channel 13 with a digital cable box and on [employer] channel 31. GTN is also available on [employer], channel 99. To view GTN, go to channel 99, press OK, select [place], then select GT[employer] to start the channel.  Misty [name] , Thank you for taking time to come for your Medicare Wellness Visit. I appreciate your ongoing commitment to your health goals. Please review the following plan we discussed and let me know if I can assist you in the future.   These are the goals we discussed: Goals    . Patient Stated     Lose weight by watching my diet and increasing my physical activity.       This is a list of the screening recommended for you and due dates:  Health Maintenance  Topic Date Due  . Tetanus Vaccine  04/10/1980  . Mammogram  01/17/2020  . Colon Cancer Screening  09/08/2024  . Flu Shot  Completed  .  Hepatitis C: One time screening is recommended by Center for Disease Control  (CDC) for  adults born from [birth year] through 1964.   Completed  . HIV Screening  Completed    Health Maintenance, Female Adopting a healthy lifestyle and getting preventive care are important in promoting health and wellness. Ask your health care provider about:  The right schedule for you to have regular tests and exams.  Things you can do on your own to prevent  diseases and keep yourself healthy. What should I know about diet, weight, and exercise? Eat a healthy diet   Eat a diet that includes plenty of vegetables, fruits, low-fat dairy products, and lean protein.  Do not eat a lot of foods that are high in solid fats, added sugars, or sodium. Maintain a healthy weight Body mass index (BMI) is used to identify weight problems. It estimates body fat based on height and weight. Your health care provider can help determine your BMI and help you achieve or maintain a healthy weight. Get regular exercise Get regular exercise. This is one of the most important things you can do for your health. Most adults should:  Exercise for at least 150 minutes each week. The exercise should increase your heart rate and make you sweat (moderate-intensity exercise).  Do strengthening exercises at least twice a week. This is in addition to the moderate-intensity exercise.  Spend less time sitting. Even light physical activity can be beneficial. Watch cholesterol and blood lipids Have your blood tested for lipids and cholesterol at 57 years of age, then have this test every 5 years. Have your cholesterol levels checked more often if:  Your lipid or cholesterol levels are high.  You are older than 57 years of age.  You are at high risk for heart disease. What should I know about cancer screening? Depending on your health history and family history, you may need to have cancer screening  at various ages. This may include screening for:  Breast cancer.  Cervical cancer.  Colorectal cancer.  Skin cancer.  Lung cancer. What should I know about heart disease, diabetes, and high blood pressure? Blood pressure and heart disease  High blood pressure causes heart disease and increases the risk of stroke. This is more likely to develop in people who have high blood pressure readings, are of African descent, or are overweight.  Have your blood pressure checked: ?  Every 3-5 years if you are 46-39 years of age. ? Every year if you are 35 years old or older. Diabetes Have regular diabetes screenings. This checks your fasting blood sugar level. Have the screening done:  Once every three years after age 42 if you are at a normal weight and have a low risk for diabetes.  More often and at a younger age if you are overweight or have a high risk for diabetes. What should I know about preventing infection? Hepatitis B If you have a higher risk for hepatitis B, you should be screened for this virus. Talk with your health care provider to find out if you are at risk for hepatitis B infection. Hepatitis C Testing is recommended for:  Everyone born from [birth year] through 1964.  Anyone with known risk factors for hepatitis C. Sexually transmitted infections (STIs)  Get screened for STIs, including gonorrhea and chlamydia, if: ? You are sexually active and are younger than 57 years of age. ? You are older than 57 years of age and your health care provider tells you that you are at risk for this type of infection. ? Your sexual activity has changed since you were last screened, and you are at increased risk for chlamydia or gonorrhea. Ask your health care provider if you are at risk.  Ask your health care provider about whether you are at high risk for HIV. Your health care provider may recommend a prescription medicine to help prevent HIV infection. If you choose to take medicine to prevent HIV, you should first get tested for HIV. You should then be tested every 3 months for as long as you are taking the medicine. Pregnancy  If you are about to stop having your period (premenopausal) and you may become pregnant, seek counseling before you get pregnant.  Take 400 to 800 micrograms (mcg) of folic acid every day if you become pregnant.  Ask for birth control (contraception) if you want to prevent pregnancy. Osteoporosis and menopause Osteoporosis is a disease in  which the bones lose minerals and strength with aging. This can result in bone fractures. If you are 29 years old or older, or if you are at risk for osteoporosis and fractures, ask your health care provider if you should:  Be screened for bone loss.  Take a calcium or vitamin D supplement to lower your risk of fractures.  Be given hormone replacement therapy (HRT) to treat symptoms of menopause. Follow these instructions at home: Lifestyle  Do not use any products that contain nicotine or tobacco, such as cigarettes, e-cigarettes, and chewing tobacco. If you need help quitting, ask your health care provider.  Do not use street drugs.  Do not share needles.  Ask your health care provider for help if you need support or information about quitting drugs. Alcohol use  Do not drink alcohol if: ? Your health care provider tells you not to drink. ? You are pregnant, may be pregnant, or are planning to become pregnant.  If you  drink alcohol: ? Limit how much you use to 0-1 drink a day. ? Limit intake if you are breastfeeding.  Be aware of how much alcohol is in your drink. In the [place] one drink equals one 12 oz bottle of beer (355 mL), one 5 oz glass of Gwendelyn Lanting (148 mL), or one 1 oz glass of hard liquor (44 mL). General instructions  Schedule regular health, dental, and eye exams.  Stay current with your vaccines.  Tell your health care provider if: ? You often feel depressed. ? You have ever been abused or do not feel safe at home. Summary  Adopting a healthy lifestyle and getting preventive care are important in promoting health and wellness.  Follow your health care provider's instructions about healthy diet, exercising, and getting tested or screened for diseases.  Follow your health care provider's instructions on monitoring your cholesterol and blood pressure. This information is not intended to replace advice given to you by your health care provider. Make sure you discuss  any questions you have with your health care provider. Document Released: 09/18/2010 Document Revised: 02/26/2018 Document Reviewed: 02/26/2018 Elsevier Patient Education  2020 [employer].

## 2018-12-25 ENCOUNTER — Ambulatory Visit: Payer: Medicare Other

## 2018-12-26 DIAGNOSIS — M545 Low back pain: Secondary | ICD-10-CM | POA: Diagnosis not present

## 2018-12-26 DIAGNOSIS — Z79891 Long term (current) use of opiate analgesic: Secondary | ICD-10-CM | POA: Diagnosis not present

## 2018-12-26 DIAGNOSIS — G894 Chronic pain syndrome: Secondary | ICD-10-CM | POA: Diagnosis not present

## 2018-12-31 ENCOUNTER — Ambulatory Visit: Payer: Medicare Other

## 2019-01-02 ENCOUNTER — Other Ambulatory Visit: Payer: Self-pay

## 2019-01-02 ENCOUNTER — Ambulatory Visit (INDEPENDENT_AMBULATORY_CARE_PROVIDER_SITE_OTHER): Payer: Medicare Other

## 2019-01-02 DIAGNOSIS — E538 Deficiency of other specified B group vitamins: Secondary | ICD-10-CM | POA: Diagnosis not present

## 2019-01-02 MED ORDER — CYANOCOBALAMIN 1000 MCG/ML IJ SOLN
1000.0000 ug | Freq: Once | INTRAMUSCULAR | Status: AC
  Administered 2019-01-02: 1000 ug via INTRAMUSCULAR

## 2019-01-04 NOTE — Progress Notes (Signed)
b12 Injection given.   Magaret Justo J Eran Mistry, MD  

## 2019-01-08 ENCOUNTER — Encounter: Payer: Self-pay | Admitting: Physical Therapy

## 2019-01-08 ENCOUNTER — Ambulatory Visit: Payer: Medicare Other | Attending: Internal Medicine | Admitting: Physical Therapy

## 2019-01-08 ENCOUNTER — Other Ambulatory Visit: Payer: Self-pay

## 2019-01-08 DIAGNOSIS — R2689 Other abnormalities of gait and mobility: Secondary | ICD-10-CM | POA: Insufficient documentation

## 2019-01-08 DIAGNOSIS — R293 Abnormal posture: Secondary | ICD-10-CM | POA: Diagnosis not present

## 2019-01-08 DIAGNOSIS — R2681 Unsteadiness on feet: Secondary | ICD-10-CM | POA: Insufficient documentation

## 2019-01-08 DIAGNOSIS — M6281 Muscle weakness (generalized): Secondary | ICD-10-CM | POA: Diagnosis not present

## 2019-01-09 ENCOUNTER — Ambulatory Visit: Payer: Medicare Other | Admitting: Physical Therapy

## 2019-01-09 ENCOUNTER — Other Ambulatory Visit: Payer: Self-pay

## 2019-01-09 DIAGNOSIS — R2681 Unsteadiness on feet: Secondary | ICD-10-CM | POA: Diagnosis not present

## 2019-01-09 DIAGNOSIS — M6281 Muscle weakness (generalized): Secondary | ICD-10-CM | POA: Diagnosis not present

## 2019-01-09 DIAGNOSIS — R2689 Other abnormalities of gait and mobility: Secondary | ICD-10-CM | POA: Diagnosis not present

## 2019-01-09 DIAGNOSIS — R293 Abnormal posture: Secondary | ICD-10-CM | POA: Diagnosis not present

## 2019-01-09 NOTE — Therapy (Signed)
[facility] [address] [place], [place], [address] Phone: [phone]   Fax:  [phone]  Physical Therapy Evaluation  Patient Details  Name: Misty [name] MRN: [id number] Date of Birth: 03-06-1961 Referring Provider (PT): Billey Gosling   Encounter Date: 01/08/2019  PT End of Session - 01/09/19 1943    Visit Number  1    Number of Visits  14    Date for PT Re-Evaluation  04/09/19    Authorization Type  Medicare/Medicaid-will need 10th visit progres note    PT Start Time  1534    PT Stop Time  1622    PT Time Calculation (min)  48 min    Activity Tolerance  Patient limited by pain    Behavior During Therapy  [facility] for tasks assessed/performed       Past Medical History:  Diagnosis Date  . Allergy   . Arthritis   . Back pain   . Colon cancer ([place])   . Depression   . Diarrhea   . GERD (gastroesophageal reflux disease)   . Hypertension   . Insomnia   . MS (multiple sclerosis) ([place])   . Other hammer toe (acquired) 03/31/2013  . Pneumonia    hx of  . Umbilical hernia     Past Surgical History:  Procedure Laterality Date  . ABDOMINAL HYSTERECTOMY    . ANTERIOR CERVICAL DECOMP/DISCECTOMY FUSION N/A 03/01/2014   Procedure: ANTERIOR CERVICAL DECOMPRESSION/DISCECTOMY FUSION 1 LEVEL;  Surgeon: Charlie Pitter, MD;  Location: [facility];  Service: Neurosurgery;  Laterality: N/A;  ANTERIOR CERVICAL DECOMPRESSION/DISCECTOMY FUSION 1 LEVEL  . BUNIONECTOMY Right 10/14/2014   @[facility]   . CESAREAN SECTION    . COLON SURGERY    . COLONOSCOPY    . FOOT SURGERY Right   . GASTRIC BYPASS    . Hammer Toe Repair Right 10/14/2014   RT #2, @[facility]   . TENOTOMY Right 10/14/2014   RT #3, @[facility]     There were no vitals filed for this visit.   Subjective Assessment - 01/08/19 1538    Subjective  My back just burns so much, in so much pain from packing up my house trying to move to an apartment.  Feel like my back is very weak.   Reports 4 falls in past 6 months. Most falls happen on the steps; feels her R knee will give way. Has a cane that she has just started using it.  Feels R side is weaker.    Pertinent History  OA bilateral hips, spondylosis low back, stenosis low back, chronic low back pain    Patient Stated Goals  Pt's goals are to have less pain and be able to move better.    Currently in Pain?  Yes    Pain Score  6     Pain Location  Back    Pain Orientation  Left;Lower    Pain Descriptors / Indicators  Burning;Tingling    Pain Type  Chronic pain    Pain Radiating Towards  L buttocks    Pain Onset  More than a month ago    Pain Frequency  Intermittent    Aggravating Factors   bending,    Pain Relieving Factors  Medication alleviates    Effect of Pain on Daily Activities  PT will monitor pain and may attempt to address through exercises and postural/positioning education         [facility] PT Assessment - 01/08/19 1545  Assessment   Medical Diagnosis  poor balance, fall risk    Referring Provider (PT)  Billey Gosling    Onset Date/Surgical Date  12/23/18   MD referral; worsening in past year     Precautions   Precautions  Fall    Precaution Comments  Avoid lifting >10 #      Balance Screen   Has the patient fallen in the past 6 months  Yes    How many times?  4    Has the patient had a decrease in activity level because of a fear of falling?   Yes    Is the patient reluctant to leave their home because of a fear of falling?   Yes      [place] residence    Living Arrangements  Alone    Available Help at Discharge  Family    Type of [place] to enter   Pt is to move to one level apartment, level entry this month   Entrance Stairs-Number of Steps  4    Entrance Stairs-Rails  Right;None    Home Layout  Two level    Alternate Level Stairs-Number of Steps  17    Alternate [place] -  single point;Tub bench    Additional Comments  Pt will be moving to handicap accessible apartment in the next few weeks      Prior Function   Level of Independence  Independent with household mobility with device    Vocation Requirements  Has worked as a Biomedical scientist in the past; not currently working    Leisure  Enjoys cooking      Observation/Other Assessments   Focus on Therapeutic Outcomes (FOTO)   NA      Sensation   Light Touch  Impaired by gross assessment      Posture/Postural Control   Posture/Postural Control  Postural limitations    Postural Limitations  Forward head;Rounded Shoulders;Flexed trunk   Flexed trunk in standing     ROM / Strength   AROM / PROM / Strength  Strength      Strength   Overall Strength  Deficits    Strength Assessment Site  Hip;Knee;Ankle    Right/Left Hip  Right;Left    Right Hip Flexion  3+/5    Left Hip Flexion  4/5    Right/Left Knee  Right;Left    Right Knee Flexion  4/5    Right Knee Extension  4/5    Left Knee Flexion  4/5    Left Knee Extension  4/5    Right/Left Ankle  Right;Left    Right Ankle Dorsiflexion  4/5    Left Ankle Dorsiflexion  4/5      Transfers   Transfers  Sit to Stand;Stand to Sit    Sit to Stand  6: Modified independent (Device/Increase time);With upper extremity assist;From chair/3-in-1    Stand to Sit  6: Modified independent (Device/Increase time);With upper extremity assist;To chair/3-in-1      Ambulation/Gait   Ambulation/Gait  Yes    Ambulation/Gait Assistance  5: Supervision    Ambulation/Gait Assistance Details  Pt has 2 episodes with gait in 75M walk test, where R knee feels that it will give way.    Ambulation Distance (Feet)  80 Feet    Assistive device  Straight cane   Uses cane in R hand (has  tried in L, but has difficulty)   Gait Pattern  Step-through pattern;Decreased stance time - right;Decreased weight shift to right;Antalgic;Trendelenburg    Ambulation Surface  Level;Unlevel    Gait velocity   16.84 sec = 1.95 ft/sec      Standardized Balance Assessment   Standardized Balance Assessment  Timed Up and Go Test;Berg Balance Test      Berg Balance Test   Sit to Stand  Able to stand  independently using hands    Standing Unsupported  Able to stand 2 minutes with supervision    Sitting with Back Unsupported but Feet Supported on Floor or Stool  Able to sit safely and securely 2 minutes    Stand to Sit  Controls descent by using hands    Transfers  Able to transfer safely, definite need of hands    Standing Unsupported with Eyes Closed  Able to stand 10 seconds with supervision    Standing Unsupported with Feet Together  Needs help to attain position but able to stand for 30 seconds with feet together    From Standing, Reach Forward with Outstretched Arm  Can reach confidently >25 cm (10")    From Standing Position, Pick up Object from Floor  Able to pick up shoe, needs supervision    From Standing Position, Turn to Look Behind Over each Shoulder  Turn sideways only but maintains balance    Turn 360 Degrees  Able to turn 360 degrees safely but slowly    Standing Unsupported, Alternately Place Feet on Step/Stool  Able to complete >2 steps/needs minimal assist    Standing Unsupported, One Foot in Front  Able to plae foot ahead of the other independently and hold 30 seconds    Standing on One Leg  Tries to lift leg/unable to hold 3 seconds but remains standing independently    Total Score  36    Berg comment:  Scores <45/56 indicate increased fall risk      Timed Up and Go Test   TUG  Normal TUG    Normal TUG (seconds)  20.18    TUG Comments  Scores >13.5 sec indicate increased fall risk                Objective measurements completed on examination: See above findings.                PT Short Term Goals - 01/09/19 1951      PT SHORT TERM GOAL #1   Title  Pt will be independent with HEP for improved strength, decreased pain, improved balance.  TARGET  01/30/2019    Time  4    Period  Weeks    Status  New    Target Date  01/30/19      PT SHORT TERM GOAL #2   Title  Pt will verbalize and demonstrate correct body mechanics and posture for ADL tasks and household activities.    Time  4    Period  Weeks    Status  New    Target Date  01/30/19      PT SHORT TERM GOAL #3   Title  Pt will improve TUG score to less than or equal to 15 seconds for decreased fall risk.    Time  4    Period  Weeks    Status  New    Target Date  01/30/19      PT SHORT TERM GOAL #4   Title  Pt will improve  Berg Balance score to at least 40/56 for decreased fall risk.    Time  4    Period  Weeks    Status  New    Target Date  01/30/19      PT SHORT TERM GOAL #5   Title  Pt will verbalize fall prevention education for home environment.    Time  4    Period  Weeks    Status  New    Target Date  01/30/19        PT Long Term Goals - 01/09/19 1955      PT LONG TERM GOAL #1   Title  Pt will be independent with advanced HEP for improved balance, strength, decreased fall risk.  TARGET for all LTGs 02/13/2019    Time  7    Period  Weeks    Status  New      PT LONG TERM GOAL #2   Title  Pt will improve TUG score to less than or equal to 13.5 seconds for decreased fall risk.    Time  7    Period  Weeks    Status  New      PT LONG TERM GOAL #3   Title  Pt will improve Berg score to at least 45/56 for decreased fall risk.    Time  7    Period  Weeks    Status  New      PT LONG TERM GOAL #4   Title  Pt will improve gait velocity to at least 2.3 ft/sec for improved gait efficiency and safety.    Time  7    Period  Weeks    Status  New             Plan - 01/09/19 1944    Clinical Impression Statement  Pt is a 58 year old femal who presents to OPPT with dx of poor balance and history of falls.  She has had 4 falls in the past 6 months.  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  She is at fall risk per Merrilee Jansky and TUG scores.  She will benefit from skilled PT to address the above stated deficits for decreased fall risk and improved overall functional mobility.    Personal Factors and Comorbidities  Comorbidity 3+    Comorbidities  OA bilateral hips, spondylosis low back, stenosis low back, chronic low back pain    Examination-Activity Limitations  Bend;Stairs;Lift;Locomotion Level    Examination-Participation Restrictions  Community Activity   Job as a Archivist  Evolving/Moderate complexity    Clinical Decision Making  Moderate    Rehab Potential  Good    PT Frequency  2x / week    PT Duration  Other (comment)   7 weeks, including eval week   PT Treatment/Interventions  ADLs/Self Care Home Management;Aquatic Therapy;Electrical Stimulation;DME Instruction;Ultrasound;Traction;Moist Heat;Gait training;Stair training;Functional mobility training;Therapeutic activities;Therapeutic exercise;Balance training;Neuromuscular re-education;Patient/family education;Manual techniques;Energy conservation    PT Next Visit Plan  Initiate HEP to address low back pain, then begin lower extremity strengthening and balance exercises; consider rolling walker/rollator for gait given Berg score of 35/56.  Posture/body mechanics education with ADLS    Consulted and Agree with Plan of Care  Patient       Patient will benefit from skilled therapeutic intervention in order to improve  the following deficits and impairments:  Abnormal gait, Decreased balance, Decreased mobility, Difficulty walking, Decreased strength, Improper body mechanics, Pain  Visit Diagnosis: Unsteadiness on feet  Other abnormalities of gait and mobility  Muscle weakness (generalized)  Abnormal posture     Problem List Patient Active Problem List   Diagnosis Date Noted  .  Hyperlipidemia 11/27/2018  . Anemia 11/25/2018  . Contact dermatitis 09/17/2018  . Nausea 09/17/2018  . Right groin pain 07/16/2018  . Rash and nonspecific skin eruption 04/16/2018  . Depression 03/03/2018  . Right shoulder pain 03/03/2018  . Physical deconditioning 03/03/2018  . Pancreatitis 02/22/2018  . Alcohol abuse 02/22/2018  . Cholelithiasis 02/22/2018  . Adhesive capsulitis of right shoulder 01/20/2018  . Ventral hernia 09/16/2017  . Greater trochanteric bursitis of right hip 04/30/2017  . Prediabetes 04/16/2017  . Numbness 01/29/2017  . Chronic low back pain 07/20/2016  . Sacral mass 07/20/2016  . Unilateral primary osteoarthritis, right hip 07/20/2016  . Spinal stenosis of lumbar region 11/29/2015  . Positive urine drug screen 11/16/2015  . Chronic pain syndrome 10/12/2015  . Umbilical hernia [id number]  . Sleeping difficulties 07/15/2015  . Bunion, right 06/20/2015  . Callus of foot 04/27/2015  . Muscle spasm 04/16/2015  . B12 deficiency 03/30/2015  . Hammer toe of left foot 02/16/2015  . Fibrosis of skin of lower extremity 02/16/2015  . Status post right foot surgery 10/26/2014  . Cervical spondylosis with radiculopathy 03/01/2014  . Hammer toe of right foot 01/26/2014  . Benign intracranial hypertension 06/18/2013  . Abnormal gait 04/21/2013  . Disturbance of skin sensation 04/21/2013  . Cephalalgia 04/21/2013  . Porokeratosis 03/31/2013  . Vitamin D deficiency 04/18/2010  . Low back pain 11/29/2008  . MEDIAL MENISCUS TEAR, LEFT 06/30/2008  . BARRETTS ESOPHAGUS 06/28/2008  . OSTEOARTHRITIS, KNEES, BILATERAL 06/28/2008  . OBESITY 05/24/2008  . HYPERTENSION, BENIGN ESSENTIAL 05/24/2008  . Irritable bowel syndrome 05/24/2008  . BARIATRIC SURGERY STATUS 03/19/2006  . NEOPLASM, MALIGNANT, COLON, HX OF 03/19/1997    Bradyn Soward W. 01/09/2019, 7:58 PM  Frazier Butt., PT   [facility] [address]  [place], [place], [address] Phone: [phone]   Fax:  [phone]  Name: Misty [name] MRN: [id number] Date of Birth: 27-Dec-1960

## 2019-01-10 ENCOUNTER — Encounter: Payer: Self-pay | Admitting: Physical Therapy

## 2019-01-10 NOTE — Patient Instructions (Signed)
Access Code: [email or web address]  URL: [email or web address]  Date: 01/09/2019  Prepared by: Mady Haagensen   Exercises Seated Pelvic Tilt - 5-10 reps - 2 sets - 1-2x daily - 5x weekly Seated Active Hip Flexion - 5-10 reps - 2 sets - 1-2x daily - 5x weekly Seated Hamstring Stretch - 3 reps - 1 sets - 30 sec hold - 1-2x daily - 7x weekly Standing Back Extension - 5 reps - 1 sets - 1-2x daily - 5x weekly Lateral Weight Shift with Arm Raise - 5-10 reps - 1-2 sets - 1x daily - 7x weekly

## 2019-01-10 NOTE — Therapy (Signed)
[facility] [address] [place], [place], [address] Phone: [phone]   Fax:  [phone]  Physical Therapy Treatment  Patient Details  Name: Misty [name] MRN: [id number] Date of Birth: Dec 26, 1960 Referring Provider (PT): Billey Gosling   Encounter Date: 01/09/2019  PT End of Session - 01/10/19 1750    Visit Number  2    Number of Visits  14    Date for PT Re-Evaluation  04/09/19    Authorization Type  Medicare/Medicaid-will need 10th visit progres note    PT Start Time  1318    PT Stop Time  1402    PT Time Calculation (min)  44 min    Activity Tolerance  Patient limited by pain   limited by pain initially, reports less pain at end of session   Behavior During Therapy  [facility] for tasks assessed/performed       Past Medical History:  Diagnosis Date  . Allergy   . Arthritis   . Back pain   . Colon cancer ([place])   . Depression   . Diarrhea   . GERD (gastroesophageal reflux disease)   . Hypertension   . Insomnia   . MS (multiple sclerosis) ([place])   . Other hammer toe (acquired) 03/31/2013  . Pneumonia    hx of  . Umbilical hernia     Past Surgical History:  Procedure Laterality Date  . ABDOMINAL HYSTERECTOMY    . ANTERIOR CERVICAL DECOMP/DISCECTOMY FUSION N/A 03/01/2014   Procedure: ANTERIOR CERVICAL DECOMPRESSION/DISCECTOMY FUSION 1 LEVEL;  Surgeon: Charlie Pitter, MD;  Location: [facility];  Service: Neurosurgery;  Laterality: N/A;  ANTERIOR CERVICAL DECOMPRESSION/DISCECTOMY FUSION 1 LEVEL  . BUNIONECTOMY Right 10/14/2014   @[facility]   . CESAREAN SECTION    . COLON SURGERY    . COLONOSCOPY    . FOOT SURGERY Right   . GASTRIC BYPASS    . Hammer Toe Repair Right 10/14/2014   RT #2, @[facility]   . TENOTOMY Right 10/14/2014   RT #3, @[facility]     There were no vitals filed for this visit.  Subjective Assessment - 01/10/19 1741    Subjective  Back is bothering me.  Haven't had my knee given way much since yesterday.   Feel like I have had no appetite the past few weeks (pt reports she did not eat today, into 1:15 pm appt, and PT gave her nabs/drink).    Pertinent History  OA bilateral hips, spondylosis low back, stenosis low back, chronic low back pain    Patient Stated Goals  Pt's goals are to have less pain and be able to move better.    Currently in Pain?  Yes    Pain Score  4     Pain Location  Back    Pain Orientation  Left;Lower    Pain Descriptors / Indicators  Burning    Pain Type  Chronic pain    Pain Radiating Towards  L buttocks, hip    Pain Onset  More than a month ago    Pain Frequency  Intermittent    Aggravating Factors   bending    Pain Relieving Factors  medications, back extension in standing                       [facility] Adult PT Treatment/Exercise - 01/10/19 0001      Exercises   Exercises  Lumbar      Lumbar Exercises: Stretches   Active Hamstring  Stretch  Right;Left;3 reps;30 seconds   foot propped on aerobic step   Active Hamstring Stretch Limitations  Seated hamstring stretch edge of mat.  Cues for correct technique    Single Knee to Chest Stretch  Right;Left;2 reps;10 seconds    Single Knee to Chest Stretch Limitations  Attempted, with pt reporting increased discomfort    Lower Trunk Rotation  5 reps   2 sets, rocking with PT guiding lower extremities   Lower Trunk Rotation Limitations  PT provides tactile cues, for rocking to relax through low back    Standing Extension  5 reps;10 reps    Standing Extension Limitations  Pt reports relief in pain with this stretch      Lumbar Exercises: Standing   Other Standing Lumbar Exercises  Standing postural/weightshifting exercises, with feet at least shoulder width apart, lateral weightshifting x 5 reps,2 set (cues that this position can help with ADLs and household tasks without overuse/improper positioning of back muscles); stagger standing position x 5 reps for improved forward/back weightshifting (again,cues to  use this position  for household tasks to lessen back pain, improper body mechanics)      Lumbar Exercises: Seated   Other Seated Lumbar Exercises  Seated lumbar anterior/posterior pelvic tilt, x 10 reps, with initial tactile cues, then pt able to perform.  No increase in pain.  Forward lean>upright posture position in sitting, x 10 reps with pt reporting no increase in pain.      Lumbar Exercises: Supine   Pelvic Tilt  5 reps;5 seconds    Pelvic Tilt Limitations  Motion limitations; PT provides tactile cues through pelvis to aid in technique    Other Supine Lumbar Exercises  Attempted several stretching and supine low back basic flexibility exercises (as noted above), but pt reports increased discomfort lying supine (she says she does not lie supine at home).  Transitioned to seated edge of mat exercises.             PT Education - 01/10/19 1750    Education Details  Initial HEP to address back pain, flexibility    Person(s) Educated  Patient    Methods  Explanation;Demonstration;Handout    Comprehension  Verbalized understanding;Returned demonstration;Verbal cues required       PT Short Term Goals - 01/09/19 1951      PT SHORT TERM GOAL #1   Title  Pt will be independent with HEP for improved strength, decreased pain, improved balance.  TARGET 01/30/2019    Time  4    Period  Weeks    Status  New    Target Date  01/30/19      PT SHORT TERM GOAL #2   Title  Pt will verbalize and demonstrate correct body mechanics and posture for ADL tasks and household activities.    Time  4    Period  Weeks    Status  New    Target Date  01/30/19      PT SHORT TERM GOAL #3   Title  Pt will improve TUG score to less than or equal to 15 seconds for decreased fall risk.    Time  4    Period  Weeks    Status  New    Target Date  01/30/19      PT SHORT TERM GOAL #4   Title  Pt will improve Berg Balance score to at least 40/56 for decreased fall risk.    Time  4    Period  Weeks  Status  New    Target Date  01/30/19      PT SHORT TERM GOAL #5   Title  Pt will verbalize fall prevention education for home environment.    Time  4    Period  Weeks    Status  New    Target Date  01/30/19        PT Long Term Goals - 01/09/19 1955      PT LONG TERM GOAL #1   Title  Pt will be independent with advanced HEP for improved balance, strength, decreased fall risk.  TARGET for all LTGs 02/13/2019    Time  7    Period  Weeks    Status  New      PT LONG TERM GOAL #2   Title  Pt will improve TUG score to less than or equal to 13.5 seconds for decreased fall risk.    Time  7    Period  Weeks    Status  New      PT LONG TERM GOAL #3   Title  Pt will improve Berg score to at least 45/56 for decreased fall risk.    Time  7    Period  Weeks    Status  New      PT LONG TERM GOAL #4   Title  Pt will improve gait velocity to at least 2.3 ft/sec for improved gait efficiency and safety.    Time  7    Period  Weeks    Status  New            Plan - 01/10/19 1751    Clinical Impression Statement  Initiated HEP to address lumbar flexibility/decreased low back pain this visit.  Pt reports less pain at end of session.  She has difficulty staying in supine position for basic back flexibility exerciess, so transitioned to sittinga nd standing.    Personal Factors and Comorbidities  Comorbidity 3+    Comorbidities  OA bilateral hips, spondylosis low back, stenosis low back, chronic low back pain    Examination-Activity Limitations  Bend;Stairs;Lift;Locomotion Level    Examination-Participation Restrictions  Community Activity   Job as a Archivist  Evolving/Moderate complexity    Rehab Potential  Good    PT Frequency  2x / week    PT Duration  Other (comment)   7 weeks, including eval week   PT Treatment/Interventions  ADLs/Self Care Home Management;Aquatic Therapy;Electrical Stimulation;DME Instruction;Ultrasound;Traction;Moist Heat;Gait  training;Stair training;Functional mobility training;Therapeutic activities;Therapeutic exercise;Balance training;Neuromuscular re-education;Patient/family education;Manual techniques;Energy conservation    PT Next Visit Plan  Review initial HEP to address low back pain, then begin lower extremity strengthening and balance exercises; consider rolling walker/rollator for gait given Berg score of 35/56.  Posture/body mechanics education with ADLS    Consulted and Agree with Plan of Care  Patient       Patient will benefit from skilled therapeutic intervention in order to improve the following deficits and impairments:  Abnormal gait, Decreased balance, Decreased mobility, Difficulty walking, Decreased strength, Improper body mechanics, Pain  Visit Diagnosis: Muscle weakness (generalized)  Abnormal posture     Problem List Patient Active Problem List   Diagnosis Date Noted  . Hyperlipidemia 11/27/2018  . Anemia 11/25/2018  . Contact dermatitis 09/17/2018  . Nausea 09/17/2018  . Right groin pain 07/16/2018  . Rash and nonspecific skin eruption 04/16/2018  . Depression 03/03/2018  . Right shoulder pain 03/03/2018  . Physical deconditioning 03/03/2018  .  Pancreatitis 02/22/2018  . Alcohol abuse 02/22/2018  . Cholelithiasis 02/22/2018  . Adhesive capsulitis of right shoulder 01/20/2018  . Ventral hernia 09/16/2017  . Greater trochanteric bursitis of right hip 04/30/2017  . Prediabetes 04/16/2017  . Numbness 01/29/2017  . Chronic low back pain 07/20/2016  . Sacral mass 07/20/2016  . Unilateral primary osteoarthritis, right hip 07/20/2016  . Spinal stenosis of lumbar region 11/29/2015  . Positive urine drug screen 11/16/2015  . Chronic pain syndrome 10/12/2015  . Umbilical hernia [id number]  . Sleeping difficulties 07/15/2015  . Bunion, right 06/20/2015  . Callus of foot 04/27/2015  . Muscle spasm 04/16/2015  . B12 deficiency 03/30/2015  . Hammer toe of left foot 02/16/2015  .  Fibrosis of skin of lower extremity 02/16/2015  . Status post right foot surgery 10/26/2014  . Cervical spondylosis with radiculopathy 03/01/2014  . Hammer toe of right foot 01/26/2014  . Benign intracranial hypertension 06/18/2013  . Abnormal gait 04/21/2013  . Disturbance of skin sensation 04/21/2013  . Cephalalgia 04/21/2013  . Porokeratosis 03/31/2013  . Vitamin D deficiency 04/18/2010  . Low back pain 11/29/2008  . MEDIAL MENISCUS TEAR, LEFT 06/30/2008  . BARRETTS ESOPHAGUS 06/28/2008  . OSTEOARTHRITIS, KNEES, BILATERAL 06/28/2008  . OBESITY 05/24/2008  . HYPERTENSION, BENIGN ESSENTIAL 05/24/2008  . Irritable bowel syndrome 05/24/2008  . BARIATRIC SURGERY STATUS 03/19/2006  . NEOPLASM, MALIGNANT, COLON, HX OF 03/19/1997    Stella Encarnacion W. 01/10/2019, 5:53 PM  Frazier Butt., PT   [facility] [address] [place], [place], [address] Phone: [phone]   Fax:  [phone]  Name: Misty [name] MRN: [id number] Date of Birth: 04/04/1960

## 2019-01-13 ENCOUNTER — Ambulatory Visit: Payer: Medicare Other | Admitting: Physical Therapy

## 2019-01-13 ENCOUNTER — Other Ambulatory Visit: Payer: Self-pay

## 2019-01-13 DIAGNOSIS — R2689 Other abnormalities of gait and mobility: Secondary | ICD-10-CM | POA: Diagnosis not present

## 2019-01-13 DIAGNOSIS — R293 Abnormal posture: Secondary | ICD-10-CM

## 2019-01-13 DIAGNOSIS — M6281 Muscle weakness (generalized): Secondary | ICD-10-CM

## 2019-01-13 DIAGNOSIS — R2681 Unsteadiness on feet: Secondary | ICD-10-CM

## 2019-01-13 NOTE — Therapy (Signed)
[facility] [address] [place], [place], [address] Phone: [phone]   Fax:  [phone]  Physical Therapy Treatment  Patient Details  Name: Misty [name] MRN: [id number] Date of Birth: 02-Aug-1960 Referring Provider (PT): Billey Gosling   Encounter Date: 01/13/2019  PT End of Session - 01/13/19 1700    Visit Number  3    Number of Visits  14    Date for PT Re-Evaluation  04/09/19    Authorization Type  Medicare/Medicaid-will need 10th visit progres note    PT Start Time  1615    PT Stop Time  1655    PT Time Calculation (min)  40 min    Activity Tolerance  Patient limited by pain   limited by pain initially, reports less pain at end of session   Behavior During Therapy  [facility] for tasks assessed/performed       Past Medical History:  Diagnosis Date  . Allergy   . Arthritis   . Back pain   . Colon cancer ([place])   . Depression   . Diarrhea   . GERD (gastroesophageal reflux disease)   . Hypertension   . Insomnia   . MS (multiple sclerosis) ([place])   . Other hammer toe (acquired) 03/31/2013  . Pneumonia    hx of  . Umbilical hernia     Past Surgical History:  Procedure Laterality Date  . ABDOMINAL HYSTERECTOMY    . ANTERIOR CERVICAL DECOMP/DISCECTOMY FUSION N/A 03/01/2014   Procedure: ANTERIOR CERVICAL DECOMPRESSION/DISCECTOMY FUSION 1 LEVEL;  Surgeon: Charlie Pitter, MD;  Location: [facility];  Service: Neurosurgery;  Laterality: N/A;  ANTERIOR CERVICAL DECOMPRESSION/DISCECTOMY FUSION 1 LEVEL  . BUNIONECTOMY Right 10/14/2014   @[facility]   . CESAREAN SECTION    . COLON SURGERY    . COLONOSCOPY    . FOOT SURGERY Right   . GASTRIC BYPASS    . Hammer Toe Repair Right 10/14/2014   RT #2, @[facility]   . TENOTOMY Right 10/14/2014   RT #3, @[facility]     There were no vitals filed for this visit.  Subjective Assessment - 01/13/19 1640    Subjective  Back pain about 5/10 just took a pain pill 20 minutes ago.    Pertinent  History  OA bilateral hips, spondylosis low back, stenosis low back, chronic low back pain    Patient Stated Goals  Pt's goals are to have less pain and be able to move better.    Pain Onset  More than a month ago           [facility] Adult PT Treatment/Exercise - 01/13/19 0001      High Level Balance   High Level Balance Comments  in bars walking with head turns side to side, up/down, lateral walking, and retrowalking all with supervision and intermit UE support PRN.       Lumbar Exercises: Stretches   Active Hamstring Stretch  Right;Left;2 reps;30 seconds    Active Hamstring Stretch Limitations  Seated hamstring stretch edge of mat.  Cues for correct technique    Standing Extension Limitations  2 sets of 10 reps    Other Lumbar Stretch Exercise  sitting lumbar flexion stretch rolling stool out 5 sec X 10, seated slump stretch X 10 bilat      Lumbar Exercises: Aerobic   Nustep  7 min L5 UE/LE with heat on her back      Lumbar Exercises: Seated   Other Seated Lumbar Exercises  pelvic tilts  X 10         PT Short Term Goals - 01/09/19 1951      PT SHORT TERM GOAL #1   Title  Pt will be independent with HEP for improved strength, decreased pain, improved balance.  TARGET 01/30/2019    Time  4    Period  Weeks    Status  New    Target Date  01/30/19      PT SHORT TERM GOAL #2   Title  Pt will verbalize and demonstrate correct body mechanics and posture for ADL tasks and household activities.    Time  4    Period  Weeks    Status  New    Target Date  01/30/19      PT SHORT TERM GOAL #3   Title  Pt will improve TUG score to less than or equal to 15 seconds for decreased fall risk.    Time  4    Period  Weeks    Status  New    Target Date  01/30/19      PT SHORT TERM GOAL #4   Title  Pt will improve Berg Balance score to at least 40/56 for decreased fall risk.    Time  4    Period  Weeks    Status  New    Target Date  01/30/19      PT SHORT TERM GOAL #5   Title  Pt  will verbalize fall prevention education for home environment.    Time  4    Period  Weeks    Status  New    Target Date  01/30/19        PT Long Term Goals - 01/09/19 1955      PT LONG TERM GOAL #1   Title  Pt will be independent with advanced HEP for improved balance, strength, decreased fall risk.  TARGET for all LTGs 02/13/2019    Time  7    Period  Weeks    Status  New      PT LONG TERM GOAL #2   Title  Pt will improve TUG score to less than or equal to 13.5 seconds for decreased fall risk.    Time  7    Period  Weeks    Status  New      PT LONG TERM GOAL #3   Title  Pt will improve Berg score to at least 45/56 for decreased fall risk.    Time  7    Period  Weeks    Status  New      PT LONG TERM GOAL #4   Title  Pt will improve gait velocity to at least 2.3 ft/sec for improved gait efficiency and safety.    Time  7    Period  Weeks    Status  New            Plan - 01/13/19 1701    Clinical Impression Statement  Session focused on lumbar stretching and mobility along with dynamic balance in bars with intermittent UE support as needed. She had some Rt knee pain during session and this may due to lumbar radiculopathy. Showed her slump stretch to add at home. Continue POC    Personal Factors and Comorbidities  Comorbidity 3+    Comorbidities  OA bilateral hips, spondylosis low back, stenosis low back, chronic low back pain    Examination-Activity Limitations  Bend;Stairs;Lift;Locomotion Level    Examination-Participation Restrictions  Community Activity   Job as a Archivist  Evolving/Moderate complexity    Rehab Potential  Good    PT Frequency  2x / week    PT Duration  Other (comment)   7 weeks, including eval week   PT Treatment/Interventions  ADLs/Self Care Home Management;Aquatic Therapy;Electrical Stimulation;DME Instruction;Ultrasound;Traction;Moist Heat;Gait training;Stair training;Functional mobility training;Therapeutic  activities;Therapeutic exercise;Balance training;Neuromuscular re-education;Patient/family education;Manual techniques;Energy conservation    PT Next Visit Plan  Review initial HEP to address low back pain, then begin lower extremity strengthening and balance exercises; consider rolling walker/rollator for gait given Berg score of 35/56.  Posture/body mechanics education with ADLS    Consulted and Agree with Plan of Care  Patient       Patient will benefit from skilled therapeutic intervention in order to improve the following deficits and impairments:  Abnormal gait, Decreased balance, Decreased mobility, Difficulty walking, Decreased strength, Improper body mechanics, Pain  Visit Diagnosis: Muscle weakness (generalized)  Abnormal posture  Unsteadiness on feet     Problem List Patient Active Problem List   Diagnosis Date Noted  . Hyperlipidemia 11/27/2018  . Anemia 11/25/2018  . Contact dermatitis 09/17/2018  . Nausea 09/17/2018  . Right groin pain 07/16/2018  . Rash and nonspecific skin eruption 04/16/2018  . Depression 03/03/2018  . Right shoulder pain 03/03/2018  . Physical deconditioning 03/03/2018  . Pancreatitis 02/22/2018  . Alcohol abuse 02/22/2018  . Cholelithiasis 02/22/2018  . Adhesive capsulitis of right shoulder 01/20/2018  . Ventral hernia 09/16/2017  . Greater trochanteric bursitis of right hip 04/30/2017  . Prediabetes 04/16/2017  . Numbness 01/29/2017  . Chronic low back pain 07/20/2016  . Sacral mass 07/20/2016  . Unilateral primary osteoarthritis, right hip 07/20/2016  . Spinal stenosis of lumbar region 11/29/2015  . Positive urine drug screen 11/16/2015  . Chronic pain syndrome 10/12/2015  . Umbilical hernia [id number]  . Sleeping difficulties 07/15/2015  . Bunion, right 06/20/2015  . Callus of foot 04/27/2015  . Muscle spasm 04/16/2015  . B12 deficiency 03/30/2015  . Hammer toe of left foot 02/16/2015  . Fibrosis of skin of lower extremity  02/16/2015  . Status post right foot surgery 10/26/2014  . Cervical spondylosis with radiculopathy 03/01/2014  . Hammer toe of right foot 01/26/2014  . Benign intracranial hypertension 06/18/2013  . Abnormal gait 04/21/2013  . Disturbance of skin sensation 04/21/2013  . Cephalalgia 04/21/2013  . Porokeratosis 03/31/2013  . Vitamin D deficiency 04/18/2010  . Low back pain 11/29/2008  . MEDIAL MENISCUS TEAR, LEFT 06/30/2008  . BARRETTS ESOPHAGUS 06/28/2008  . OSTEOARTHRITIS, KNEES, BILATERAL 06/28/2008  . OBESITY 05/24/2008  . HYPERTENSION, BENIGN ESSENTIAL 05/24/2008  . Irritable bowel syndrome 05/24/2008  . BARIATRIC SURGERY STATUS 03/19/2006  . NEOPLASM, MALIGNANT, COLON, HX OF 03/19/1997    Silvestre Mesi 01/13/2019, 5:03 PM  [facility] [address] [place], [place], [address] Phone: [phone]   Fax:  [phone]  Name: ACCACIA DIMURO MRN: [id number] Date of Birth: 26-Jan-1961

## 2019-01-20 ENCOUNTER — Ambulatory Visit: Payer: Medicare Other | Admitting: Physical Therapy

## 2019-01-20 NOTE — Progress Notes (Signed)
Virtual Visit via Video Note  I connected with Misty [name] on 01/21/19 at 11:00 AM EST by a video enabled telemedicine application and verified that I am speaking with the correct person using two identifiers.   I discussed the limitations of evaluation and management by telemedicine and the availability of in person appointments. The patient expressed understanding and agreed to proceed.  The patient is currently at home and I am in the office.    No referring provider.    History of Present Illness: She is here for follow up of her chronic medical conditions.   She is exercising regularly - doing PT.  She has had some increased stress because she is in the process of getting ready to move to a smaller apartment.  Hypertension: She is taking her medication daily. She takes the hctz prn. She is compliant with a low sodium diet.  She denies chest pain, palpitations, shortness of breath and regular headaches. She does monitor her blood pressure at home - well controlled.    Depression: She is taking the remeron as needed only.  She denies any side effects from the medication. She feels her depression is well controlled and she is happy with her current dose of medication. She is doing better with her son's death.    Prediabetes:  She is compliant with a low sugar/carbohydrate diet.  She is exercising some-mostly physical therapy.  insomnia:  She takes remeron prn only.  She does not feel like that she needs medication on a daily basis and prefers to just take it as needed.  It does help when she takes it.  H/o Alcohol abuse, h/o pancreatitis:  She is drinking only occasional alcohol.    Chronic lower back pain: she follows with pain management.  She takes gabapentin daily.    She has pain in her inner right thigh and it goes down her leg. She feels it only at night or when laying down.     Review of Systems  Constitutional: Negative for chills and fever.  Respiratory: Negative  for cough, shortness of breath and wheezing.   Cardiovascular: Positive for leg swelling. Negative for chest pain and palpitations.  Musculoskeletal: Positive for back pain.  Neurological: Negative for dizziness and headaches.     Social History   Socioeconomic History  . Marital status: Single    Spouse name: Not on file  . Number of children: 2  . Years of education: Not on file  . Highest education level: Not on file  Occupational History  . Occupation: Disability  Social Needs  . Financial resource strain: Not hard at all  . Food insecurity    Worry: Never true    Inability: Never true  . Transportation needs    Medical: No    Non-medical: No  Tobacco Use  . Smoking status: Former Smoker    Packs/day: 0.25    Years: 20.00    Pack years: 5.00    Types: Cigarettes    Quit date: 09/22/2018    Years since quitting: 0.3  . Smokeless tobacco: Never Used  . Tobacco comment: states she quit 3 months ago  Substance and Sexual Activity  . Alcohol use: Yes    Alcohol/week: 0.0 standard drinks    Comment: 1/2 pint daily  . Drug use: No  . Sexual activity: Yes    Partners: Male  Lifestyle  . Physical activity    Days per week: 0 days    Minutes per session:  0 min  . Stress: To some extent  Relationships  . Social connections    Talks on phone: More than three times a week    Gets together: More than three times a week    Attends religious service: Not on file    Active member of club or organization: Not on file    Attends meetings of clubs or organizations: Not on file    Relationship status: Not on file  Other Topics Concern  . Not on file  Social History Narrative  . Not on file     Observations/Objective: Appears well in NAD Breathing normally. Normal mood and affect  Assessment and Plan:  See Problem List for Assessment and Plan of chronic medical problems.   Follow Up Instructions:    I discussed the assessment and treatment plan with the patient.  The patient was provided an opportunity to ask questions and all were answered. The patient agreed with the plan and demonstrated an understanding of the instructions.   The patient was advised to call back or seek an in-person evaluation if the symptoms worsen or if the condition fails to improve as anticipated.  FU in 6 months  [name], MD

## 2019-01-21 ENCOUNTER — Encounter: Payer: Self-pay | Admitting: Internal Medicine

## 2019-01-21 ENCOUNTER — Ambulatory Visit (INDEPENDENT_AMBULATORY_CARE_PROVIDER_SITE_OTHER): Payer: Medicare Other | Admitting: Internal Medicine

## 2019-01-21 DIAGNOSIS — R7303 Prediabetes: Secondary | ICD-10-CM

## 2019-01-21 DIAGNOSIS — F329 Major depressive disorder, single episode, unspecified: Secondary | ICD-10-CM

## 2019-01-21 DIAGNOSIS — F1010 Alcohol abuse, uncomplicated: Secondary | ICD-10-CM

## 2019-01-21 DIAGNOSIS — G479 Sleep disorder, unspecified: Secondary | ICD-10-CM

## 2019-01-21 DIAGNOSIS — I10 Essential (primary) hypertension: Secondary | ICD-10-CM

## 2019-01-21 NOTE — Assessment & Plan Note (Addendum)
Combination of depression and difficulty sleeping Overall doing better on both because she has been able to find some closure with her son's death and is dealing with that much better Taking Remeron only as needed Okay to continue as needed for now

## 2019-01-21 NOTE — Assessment & Plan Note (Signed)
History of heavy alcohol use-this was a problem after her son's death, but she is doing much better with that and is not abusing alcohol She does have some alcohol on occasion

## 2019-01-21 NOTE — Assessment & Plan Note (Signed)
Lab Results  Component Value Date   HGBA1C 5.6 11/25/2018    Sugars have been well controlled Continue low sugar/carbohydrate diet Advised her to be as active as possible

## 2019-01-21 NOTE — Assessment & Plan Note (Signed)
BP well controlled at home Continue current medications

## 2019-01-23 ENCOUNTER — Ambulatory Visit: Payer: Medicare Other | Admitting: Physical Therapy

## 2019-01-23 DIAGNOSIS — M545 Low back pain: Secondary | ICD-10-CM | POA: Diagnosis not present

## 2019-01-23 DIAGNOSIS — Z79891 Long term (current) use of opiate analgesic: Secondary | ICD-10-CM | POA: Diagnosis not present

## 2019-01-23 DIAGNOSIS — G894 Chronic pain syndrome: Secondary | ICD-10-CM | POA: Diagnosis not present

## 2019-01-27 ENCOUNTER — Other Ambulatory Visit: Payer: Self-pay

## 2019-01-27 ENCOUNTER — Encounter: Payer: Self-pay | Admitting: Physical Therapy

## 2019-01-27 ENCOUNTER — Ambulatory Visit: Payer: Medicare Other | Attending: Internal Medicine | Admitting: Physical Therapy

## 2019-01-27 DIAGNOSIS — R2681 Unsteadiness on feet: Secondary | ICD-10-CM | POA: Insufficient documentation

## 2019-01-27 DIAGNOSIS — R2689 Other abnormalities of gait and mobility: Secondary | ICD-10-CM | POA: Diagnosis not present

## 2019-01-27 DIAGNOSIS — M6281 Muscle weakness (generalized): Secondary | ICD-10-CM | POA: Diagnosis not present

## 2019-01-27 DIAGNOSIS — R293 Abnormal posture: Secondary | ICD-10-CM | POA: Insufficient documentation

## 2019-01-27 NOTE — Patient Instructions (Addendum)

## 2019-01-28 NOTE — Therapy (Signed)
[facility] [address] [place], [place], [address] Phone: [phone]   Fax:  [phone]  Physical Therapy Treatment  Patient Details  Name: Misty [name] MRN: [id number] Date of Birth: [birth year] Referring Provider (PT): Billey Gosling   Encounter Date: 01/27/2019  PT End of Session - 01/27/19 1543    Visit Number  4    Number of Visits  14    Date for PT Re-Evaluation  04/09/19    Authorization Type  Medicare/Medicaid-will need 10th visit progres note    PT Start Time  1532    PT Stop Time  1602   pt needed to leave early to meet daughter   PT Time Calculation (min)  30 min    Activity Tolerance  Patient limited by pain   limited by pain initially, reports less pain at end of session   Behavior During Therapy  [facility] for tasks assessed/performed       Past Medical History:  Diagnosis Date  . Allergy   . Arthritis   . Back pain   . Colon cancer ([place])   . Depression   . Diarrhea   . GERD (gastroesophageal reflux disease)   . Hypertension   . Insomnia   . MS (multiple sclerosis) ([place])   . Other hammer toe (acquired) 03/31/2013  . Pneumonia    hx of  . Umbilical hernia     Past Surgical History:  Procedure Laterality Date  . ABDOMINAL HYSTERECTOMY    . ANTERIOR CERVICAL DECOMP/DISCECTOMY FUSION N/A 03/01/2014   Procedure: ANTERIOR CERVICAL DECOMPRESSION/DISCECTOMY FUSION 1 LEVEL;  Surgeon: Charlie Pitter, MD;  Location: [facility];  Service: Neurosurgery;  Laterality: N/A;  ANTERIOR CERVICAL DECOMPRESSION/DISCECTOMY FUSION 1 LEVEL  . BUNIONECTOMY Right 10/14/2014   '@[facility]'$   . CESAREAN SECTION    . COLON SURGERY    . COLONOSCOPY    . FOOT SURGERY Right   . GASTRIC BYPASS    . Hammer Toe Repair Right 10/14/2014   RT #2, '@[facility]'$   . TENOTOMY Right 10/14/2014   RT #3, '@[facility]'$     There were no vitals filed for this visit.  Subjective Assessment - 01/27/19 1536    Subjective  Reports she is tired from  packing to move. No falls to report. Did have a "slip" however caught herself. Was not using the cane because she was in the house. Reports her daughter wanted to meet her at her house at 4 today, she moved to 4:15 because she had at 30 minute appt here and did not want to cancel again. Informed pt that appt's here are up to 45 minutes long for future reference and we will end early today so she can meet up with her daughter. PT appreciative as she stated "that's a relief, because if she gets there before me she will throw all my stuff away when she see's the mess".    Pertinent History  OA bilateral hips, spondylosis low back, stenosis low back, chronic low back pain    Patient Stated Goals  Pt's goals are to have less pain and be able to move better.    Currently in Pain?  Yes    Pain Score  5     Pain Location  Back   and right shoulder   Pain Orientation  Lower    Pain Descriptors / Indicators  Aching;Sore;Tightness    Pain Type  Chronic pain    Pain Onset  More than a month ago  Pain Frequency  Intermittent    Aggravating Factors   lifting with shoulder, bending hurts back more    Pain Relieving Factors  medications, rest, heating pads, medicated "rub"         [facility] PT Assessment - 01/27/19 1552      Timed Up and Go Test   TUG  Normal TUG    Normal TUG (seconds)  12.44   no AD used   TUG Comments  Scores >13.5 sec indicate increased fall risk           [facility] Adult PT Treatment/Exercise - 01/27/19 1552      Transfers   Transfers  Sit to Stand;Stand to Sit    Sit to Stand  6: Modified independent (Device/Increase time);With upper extremity assist;From chair/3-in-1    Stand to Sit  6: Modified independent (Device/Increase time);With upper extremity assist;To chair/3-in-1      Ambulation/Gait   Ambulation/Gait  Yes    Ambulation/Gait Assistance  5: Supervision    Ambulation Distance (Feet)  210 Feet   x1, plus around gym   Assistive device  Straight cane    Gait Pattern   Step-through pattern;Decreased stance time - right;Decreased weight shift to right;Antalgic;Trendelenburg    Ambulation Surface  Level;Indoor    Gait velocity  11.10 sec's= 2.96 ft/sec with cane          PT Education - 01/27/19 1546    Education Details  fall prevention strategies    Person(s) Educated  Patient    Methods  Explanation;Demonstration;Verbal cues;Handout    Comprehension  Verbalized understanding;Returned demonstration;Verbal cues required       PT Short Term Goals - 01/27/19 1544      PT SHORT TERM GOAL #1   Title  Pt will be independent with HEP for improved strength, decreased pain, improved balance.  TARGET 01/30/2019    Baseline  01/27/19: reports using handout to do these at home    Status  Achieved    Target Date  --      PT SHORT TERM GOAL #2   Title  Pt will verbalize and demonstrate correct body mechanics and posture for ADL tasks and household activities.    Time  4    Period  Weeks    Status  On-going    Target Date  01/30/19      PT SHORT TERM GOAL #3   Title  Pt will improve TUG score to less than or equal to 15 seconds for decreased fall risk.    Baseline  01/27/19: 12.44 sec's no AD used    Time  --    Period  --    Status  Achieved    Target Date  --      PT SHORT TERM GOAL #4   Title  Pt will improve Berg Balance score to at least 40/56 for decreased fall risk.    Time  4    Period  Weeks    Status  On-going    Target Date  01/30/19      PT SHORT TERM GOAL #5   Title  Pt will verbalize fall prevention education for home environment.    Baseline  01/27/19: provided handout to pt today on fall prevention strategies. Pt able to return verbalization.    Time  --    Period  --    Status  Achieved    Target Date  --        PT Long Term Goals - 01/09/19  1955      PT LONG TERM GOAL #1   Title  Pt will be independent with advanced HEP for improved balance, strength, decreased fall risk.  TARGET for all LTGs 02/13/2019    Time  7     Period  Weeks    Status  New      PT LONG TERM GOAL #2   Title  Pt will improve TUG score to less than or equal to 13.5 seconds for decreased fall risk.    Time  7    Period  Weeks    Status  New      PT LONG TERM GOAL #3   Title  Pt will improve Berg score to at least 45/56 for decreased fall risk.    Time  7    Period  Weeks    Status  New      PT LONG TERM GOAL #4   Title  Pt will improve gait velocity to at least 2.3 ft/sec for improved gait efficiency and safety.    Time  7    Period  Weeks    Status  New            Plan - 01/27/19 1543    Clinical Impression Statement  Today's skilled session focused on progress toward STGs with 3 of 5 goals checked today met. Pt has improved her Timed up and go to 12.44 sec's with no AD (was over 20 sec's at eval). Pt also has met her HEP goal and verbalizing fall prevention for home goal. Will plan to check remaining goals at next session. Of note, at end of session pt was inquiring what PT was going to do to address her right shoulder pain. Provided education that she was referred to [name] for balance only, that the MD order did not cover [name] to treat her shoulder. Pt verbalized understanding. Pt is progressing despite missing several visits and should benefit from continued PT to progress toward unmet goals.    Personal Factors and Comorbidities  Comorbidity 3+    Comorbidities  OA bilateral hips, spondylosis low back, stenosis low back, chronic low back pain    Examination-Activity Limitations  Bend;Stairs;Lift;Locomotion Level    Examination-Participation Restrictions  Community Activity   Job as a Archivist  Evolving/Moderate complexity    Rehab Potential  Good    PT Frequency  2x / week    PT Duration  Other (comment)   7 weeks, including eval week   PT Treatment/Interventions  ADLs/Self Care Home Management;Aquatic Therapy;Electrical Stimulation;DME Instruction;Ultrasound;Traction;Moist Heat;Gait  training;Stair training;Functional mobility training;Therapeutic activities;Therapeutic exercise;Balance training;Neuromuscular re-education;Patient/family education;Manual techniques;Energy conservation    PT Next Visit Plan  check remaining STGs; continue to address standing balance and LE strengthening being mindful of back pain.    Consulted and Agree with Plan of Care  Patient       Patient will benefit from skilled therapeutic intervention in order to improve the following deficits and impairments:  Abnormal gait, Decreased balance, Decreased mobility, Difficulty walking, Decreased strength, Improper body mechanics, Pain  Visit Diagnosis: Muscle weakness (generalized)  Abnormal posture  Unsteadiness on feet  Other abnormalities of gait and mobility     Problem List Patient Active Problem List   Diagnosis Date Noted  . Hyperlipidemia 11/27/2018  . Anemia 11/25/2018  . Contact dermatitis 09/17/2018  . Nausea 09/17/2018  . Right groin pain 07/16/2018  . Rash and nonspecific skin eruption 04/16/2018  . Depression 03/03/2018  .  Right shoulder pain 03/03/2018  . Physical deconditioning 03/03/2018  . Pancreatitis 02/22/2018  . Alcohol abuse 02/22/2018  . Cholelithiasis 02/22/2018  . Adhesive capsulitis of right shoulder 01/20/2018  . Ventral hernia 09/16/2017  . Greater trochanteric bursitis of right hip 04/30/2017  . Prediabetes 04/16/2017  . Numbness 01/29/2017  . Chronic low back pain 07/20/2016  . Sacral mass 07/20/2016  . Unilateral primary osteoarthritis, right hip 07/20/2016  . Spinal stenosis of lumbar region 11/29/2015  . Positive urine drug screen 11/16/2015  . Chronic pain syndrome 10/12/2015  . Umbilical hernia 53/66/4403  . Sleeping difficulties 07/15/2015  . Bunion, right 06/20/2015  . Callus of foot 04/27/2015  . Muscle spasm 04/16/2015  . B12 deficiency 03/30/2015  . Hammer toe of left foot 02/16/2015  . Fibrosis of skin of lower extremity  02/16/2015  . Status post right foot surgery 10/26/2014  . Cervical spondylosis with radiculopathy 03/01/2014  . Hammer toe of right foot 01/26/2014  . Benign intracranial hypertension 06/18/2013  . Disturbance of skin sensation 04/21/2013  . Cephalalgia 04/21/2013  . Porokeratosis 03/31/2013  . Vitamin D deficiency 04/18/2010  . Low back pain 11/29/2008  . MEDIAL MENISCUS TEAR, LEFT 06/30/2008  . BARRETTS ESOPHAGUS 06/28/2008  . OSTEOARTHRITIS, KNEES, BILATERAL 06/28/2008  . OBESITY 05/24/2008  . HYPERTENSION, BENIGN ESSENTIAL 05/24/2008  . Irritable bowel syndrome 05/24/2008  . BARIATRIC SURGERY STATUS 03/19/2006  . NEOPLASM, MALIGNANT, COLON, HX OF 03/19/1997    Willow Ora, PTA, [facility] [address], [place], [place] [address] [phone] 01/28/19, 9:50 AM   Name: Misty [name] MRN: [id number] Date of Birth: [birth year]

## 2019-01-29 ENCOUNTER — Ambulatory Visit: Payer: Medicare Other | Admitting: Physical Therapy

## 2019-01-30 ENCOUNTER — Ambulatory Visit: Payer: Medicare Other | Admitting: Physical Therapy

## 2019-02-02 ENCOUNTER — Ambulatory Visit: Payer: Medicare Other

## 2019-02-03 ENCOUNTER — Other Ambulatory Visit: Payer: Self-pay

## 2019-02-03 ENCOUNTER — Ambulatory Visit: Payer: Medicare Other | Admitting: Physical Therapy

## 2019-02-03 ENCOUNTER — Encounter: Payer: Self-pay | Admitting: Physical Therapy

## 2019-02-03 DIAGNOSIS — R2681 Unsteadiness on feet: Secondary | ICD-10-CM | POA: Diagnosis not present

## 2019-02-03 DIAGNOSIS — R293 Abnormal posture: Secondary | ICD-10-CM

## 2019-02-03 DIAGNOSIS — R2689 Other abnormalities of gait and mobility: Secondary | ICD-10-CM | POA: Diagnosis not present

## 2019-02-03 DIAGNOSIS — M6281 Muscle weakness (generalized): Secondary | ICD-10-CM

## 2019-02-03 NOTE — Patient Instructions (Signed)
Aquatic Therapy: What to Expect!  Where:  [facility]   NOTE: You will receive an automated phone message [address]   reminding you of your appointment and it will say the  [place], [place]  [address]    appointment is at the Rehab Center on [address] We are  [phone]     working to fix this - just know that you will meet us at        pool! How to Prepare: . Please make sure you drink 8 ounces of water about one hour prior to your pool session . A caregiver must attend the entire session with the patient.  The caregiver will be responsible for assisting with dressing as well as any toileting needs.  . Please arrive IN YOUR SUIT and a few minutes prior to your appointment - a health screen will be completed as you enter the Aquatic Center.  . Please make sure to attend to any toileting needs prior to entering the pool . Once on the pool deck your therapist will ask you to sign the Patient  Consent and Assignment of Benefits form . Your therapist may take your blood pressure prior to, during and after your session if indicated  About the pool: 1. Entering the pool Your therapist will assist you; there are multiple ways to enter including stairs with railings, a walk in ramp, a roll in chair and a mechanical lift. Your therapist will determine the most appropriate way for you. 2. Water temperature is usually between 86-87 degrees 3. There may be other swimmers in the pool at the same time   Contact Info:     Appointments: [facility]  All sessions are 45 minutes   [address]  [address]    Please call the [facility] if   , [place]  [phone]    you need to cancel or reschedule an appointment.  [phone]          Aquatic Therapy:  Keeping Everyone Safe!!!  We are so excited to be back in the pool for therapy and can't wait to see you in the water!! Having said that, we also want to make sure that we keep  you, your family member, and everyone one else safe.  We have been in touch with our national aquatic association as well as the CDC to develop safe guidelines for aquatic therapy. First, we want to assure you that the water is one of the safest places to be right now - chlorine and bromine kill the virus and the CDC states the virus cannot be transmitted in a pool; that's great news for us! Below are specific guidelines from the CDC that we will ask you and your caregiver to follow when coming to the  Aquatic Center for therapy. 1. Please shower AT HOME and COME IN YOUR SUIT and cover up to the pool. 2. Please ensure that you are ON TIME and ready (meaning that you are in your suit and ready to enter the pool) for your appointment - all of our pool appointments are full and there is a waiting list. If you are late, we cannot extend your time in the pool.   3. Locker rooms are open but limited to 4 people at a time. At the end of your session, you can either change into dry clothes in the locker room or plan to leave in your suit /cover up   and change at home. If you require assistance with this, your caregiver will need to provide that assistance.  4. Follow the Aquatic Center's guidelines to use bathroom/locker room facilities. Signs are posted to provide guidelines for you.  5. The Aquatic Center staff will complete a health screen for you and caregiver as you enter the building.  Once this is completed, PLEASE PROCEED DIRECTLY TO THE POOL DECK. We will be waiting for you there! 6. Masks:  your caregiver must wear a mask at all times. The CDC recommends that patients and therapists wear their masks until we enter the water and then put them back on as we leave the water. PLEASE BRING A PLASTIC BAG TO STORE YOUR MASK IN WHILE WE ARE IN THE POOL.   Additional safety measures: 1. The Aquatic Center will be practicing social distancing, wearing masks and implementing a stringent cleaning program.   2. We will only be using hard surface equipment and we will be cleaning it between all patients. 3. We will be cleaning hand rails used to enter the pool and chairs that your caregiver might use. 4. We as employees of [employer] must complete a screening every day we work prior to our shift. 5. We are only offering aquatic therapy to patients who have been determined to be at low risk for the virus - we are happy to share that screen with you if you are interested.  We are excited to be able to offer aquatic therapy again in addition to services at our outpatient clinic - thank you for the opportunity to serve you!  Suzanne Dilday, PT    Karen Pulaski, OTR/L    08/06/18  

## 2019-02-04 NOTE — Therapy (Signed)
[facility] [address] [place], [place], [address] Phone: [phone]   Fax:  [phone]  Physical Therapy Treatment  Patient Details  Name: Misty [name] MRN: [id number] Date of Birth: [birth year] Referring Provider (PT): Billey Gosling   Encounter Date: 02/03/2019  PT End of Session - 02/03/19 1415    Visit Number  5    Number of Visits  14    Date for PT Re-Evaluation  04/09/19    Authorization Type  Medicare/Medicaid-will need 10th visit progres note    PT Start Time  1409   pt late for appt today due to traffic   PT Stop Time  1445    PT Time Calculation (min)  36 min    Equipment Utilized During Treatment  Gait belt    Activity Tolerance  Patient limited by pain;Patient tolerated treatment well   limited by pain initially, reports less pain at end of session   Behavior During Therapy  [facility] for tasks assessed/performed       Past Medical History:  Diagnosis Date  . Allergy   . Arthritis   . Back pain   . Colon cancer ([place])   . Depression   . Diarrhea   . GERD (gastroesophageal reflux disease)   . Hypertension   . Insomnia   . MS (multiple sclerosis) ([place])   . Other hammer toe (acquired) 03/31/2013  . Pneumonia    hx of  . Umbilical hernia     Past Surgical History:  Procedure Laterality Date  . ABDOMINAL HYSTERECTOMY    . ANTERIOR CERVICAL DECOMP/DISCECTOMY FUSION N/A 03/01/2014   Procedure: ANTERIOR CERVICAL DECOMPRESSION/DISCECTOMY FUSION 1 LEVEL;  Surgeon: Charlie Pitter, MD;  Location: [facility];  Service: Neurosurgery;  Laterality: N/A;  ANTERIOR CERVICAL DECOMPRESSION/DISCECTOMY FUSION 1 LEVEL  . BUNIONECTOMY Right 10/14/2014   @[facility]   . CESAREAN SECTION    . COLON SURGERY    . COLONOSCOPY    . FOOT SURGERY Right   . GASTRIC BYPASS    . Hammer Toe Repair Right 10/14/2014   RT #2, @[facility]   . TENOTOMY Right 10/14/2014   RT #3, @[facility]     There were no vitals filed for this  visit.  Subjective Assessment - 02/03/19 1410    Subjective  Stubbed her toe when trying to move the "big freezer" off a cord. Also having increased back pain since moving the freezer. Reports doing the stretches and using heat at home.    Pertinent History  OA bilateral hips, spondylosis low back, stenosis low back, chronic low back pain    Patient Stated Goals  Pt's goals are to have less pain and be able to move better.    Currently in Pain?  Yes    Pain Score  7     Pain Location  Back    Pain Orientation  Lower    Pain Descriptors / Indicators  Aching;Sore;Tightness    Pain Type  Chronic pain    Pain Radiating Towards  into left buttocks, hip    Pain Onset  More than a month ago    Pain Frequency  Intermittent    Aggravating Factors   increased activity, bending hurts back more    Pain Relieving Factors  medications, rest, heating pads, medicated "rub"         [facility] PT Assessment - 02/03/19 1420      Berg Balance Test   Sit to Stand  Able to stand without using hands  and stabilize independently    Standing Unsupported  Able to stand safely 2 minutes    Sitting with Back Unsupported but Feet Supported on Floor or Stool  Able to sit safely and securely 2 minutes    Stand to Sit  Sits safely with minimal use of hands    Transfers  Able to transfer safely, minor use of hands    Standing Unsupported with Eyes Closed  Able to stand 10 seconds safely    Standing Unsupported with Feet Together  Able to place feet together independently and stand for 1 minute with supervision    From Standing, Reach Forward with Outstretched Arm  Can reach confidently >25 cm (10")    From Standing Position, Pick up Object from Floor  Able to pick up shoe, needs supervision    From Standing Position, Turn to Look Behind Over each Shoulder  Looks behind one side only/other side shows less weight shift   right>left   Turn 360 Degrees  Able to turn 360 degrees safely in 4 seconds or less    Standing  Unsupported, Alternately Place Feet on Step/Stool  Able to stand independently and safely and complete 8 steps in 20 seconds   <10 sec's   Standing Unsupported, One Foot in Front  Able to plae foot ahead of the other independently and hold 30 seconds    Standing on One Leg  Able to lift leg independently and hold 5-10 seconds    Total Score  51    Berg comment:  51/56= moderate risk for falls          [facility] Adult PT Treatment/Exercise - 02/03/19 1435      Transfers   Transfers  Sit to Stand;Stand to Sit    Sit to Stand  6: Modified independent (Device/Increase time);With upper extremity assist;From chair/3-in-1    Stand to Sit  6: Modified independent (Device/Increase time);With upper extremity assist;To chair/3-in-1      Ambulation/Gait   Ambulation/Gait  Yes    Ambulation/Gait Assistance  5: Supervision    Ambulation/Gait Assistance Details  cues on posture and for increased step length with gait    Ambulation Distance (Feet)  --   around gym with session   Assistive device  Straight cane    Gait Pattern  Step-through pattern;Decreased stance time - right;Decreased weight shift to right;Antalgic;Trendelenburg    Ambulation Surface  Level;Indoor      Self-Care   Self-Care  Other Self-Care Comments    Other Self-Care Comments   dicussed PT recommendation for aquatic sessions. Pt has done this before and reports pain relief with being in the pool. Pt's daughter made her stop due to Covid and too many people in the pool. Provided pt information on our aquatic program and that it's not that many in the pool due to covid retrictions. She is familiar with where the [facility] is as she is moving to that side of town this weekend. She took the information and plans to discuss it with her daughter. She will let us know at next session if she is wanting to persue getting into the pool again.              PT Education - 02/03/19 1920    Education Details  information on our aquatic therapy  program    Person(s) Educated  Patient    Methods  Explanation;Demonstration;Verbal cues;Handout    Comprehension  Verbalized understanding;Returned demonstration       PT Short Term Goals -  02/03/19 1418      PT SHORT TERM GOAL #1   Title  Pt will be independent with HEP for improved strength, decreased pain, improved balance.  TARGET 01/30/2019    Baseline  01/27/19: reports using handout to do these at home    Status  Achieved      PT SHORT TERM GOAL #2   Title  Pt will verbalize and demonstrate correct body mechanics and posture for ADL tasks and household activities.    Time  4    Period  Weeks    Status  On-going    Target Date  01/30/19      PT SHORT TERM GOAL #3   Title  Pt will improve TUG score to less than or equal to 15 seconds for decreased fall risk.    Baseline  01/27/19: 12.44 sec's no AD used    Status  Achieved      PT SHORT TERM GOAL #4   Title  Pt will improve Berg Balance score to at least 40/56 for decreased fall risk.    Baseline  02/03/19: pt scored 51/56 today    Time  --    Period  --    Status  Achieved    Target Date  01/30/19      PT SHORT TERM GOAL #5   Title  Pt will verbalize fall prevention education for home environment.    Baseline  01/27/19: provided handout to pt today on fall prevention strategies. Pt able to return verbalization.    Status  Achieved        PT Long Term Goals - 02/04/19 1922      PT LONG TERM GOAL #1   Title  Pt will be independent with advanced HEP for improved balance, strength, decreased fall risk.  TARGET for all LTGs 02/13/2019    Time  7    Period  Weeks    Status  On-going      PT LONG TERM GOAL #2   Title  Pt will improve TUG score to less than or equal to 13.5 seconds for decreased fall risk.    Time  7    Period  Weeks    Status  On-going      PT LONG TERM GOAL #3   Title  Pt will improve Berg score to at least 45/56 for decreased fall risk.    Baseline  02/03/19: pt scored 51/56 today     Status  Achieved      PT LONG TERM GOAL #4   Title  Pt will improve gait velocity to at least 2.3 ft/sec for improved gait efficiency and safety.    Time  7    Period  Weeks    Status  On-going            Plan - 02/03/19 1923    Clinical Impression Statement  Today's skilled session initailly focused on Berg Balance test to assess progress toward STG. Pt scored 51/56 today which meets her STG and LTG for this test. Also provided pt information on our aquatic therapy and addressed concerns/questions she has. Pt want to discuss with her daughter and will let us know at her next appt if she wants to participate in this program. The pt is progressing and should benefit from continued PT to progress toward unmet goals.    Personal Factors and Comorbidities  Comorbidity 3+    Comorbidities  OA bilateral hips, spondylosis low back, stenosis low back, chronic  low back pain    Examination-Activity Limitations  Bend;Stairs;Lift;Locomotion Level    Examination-Participation Restrictions  Community Activity   Job as a Archivist  Evolving/Moderate complexity    Rehab Potential  Good    PT Frequency  2x / week    PT Duration  Other (comment)   7 weeks, including eval week   PT Treatment/Interventions  ADLs/Self Care Home Management;Aquatic Therapy;Electrical Stimulation;DME Instruction;Ultrasound;Traction;Moist Heat;Gait training;Stair training;Functional mobility training;Therapeutic activities;Therapeutic exercise;Balance training;Neuromuscular re-education;Patient/family education;Manual techniques;Energy conservation    PT Next Visit Plan  educate on body mechanics to address remaining STG; trial TENS to low back with session to assess if it helps her back pain, if so provide with ordering information from [employer] as insurance does not cover them anymore.    Consulted and Agree with Plan of Care  Patient       Patient will benefit from skilled therapeutic  intervention in order to improve the following deficits and impairments:  Abnormal gait, Decreased balance, Decreased mobility, Difficulty walking, Decreased strength, Improper body mechanics, Pain  Visit Diagnosis: Muscle weakness (generalized)  Abnormal posture  Unsteadiness on feet  Other abnormalities of gait and mobility     Problem List Patient Active Problem List   Diagnosis Date Noted  . Hyperlipidemia 11/27/2018  . Anemia 11/25/2018  . Contact dermatitis 09/17/2018  . Nausea 09/17/2018  . Right groin pain 07/16/2018  . Rash and nonspecific skin eruption 04/16/2018  . Depression 03/03/2018  . Right shoulder pain 03/03/2018  . Physical deconditioning 03/03/2018  . Pancreatitis 02/22/2018  . Alcohol abuse 02/22/2018  . Cholelithiasis 02/22/2018  . Adhesive capsulitis of right shoulder 01/20/2018  . Ventral hernia 09/16/2017  . Greater trochanteric bursitis of right hip 04/30/2017  . Prediabetes 04/16/2017  . Numbness 01/29/2017  . Chronic low back pain 07/20/2016  . Sacral mass 07/20/2016  . Unilateral primary osteoarthritis, right hip 07/20/2016  . Spinal stenosis of lumbar region 11/29/2015  . Positive urine drug screen 11/16/2015  . Chronic pain syndrome 10/12/2015  . Umbilical hernia [id number]  . Sleeping difficulties 07/15/2015  . Bunion, right 06/20/2015  . Callus of foot 04/27/2015  . Muscle spasm 04/16/2015  . B12 deficiency 03/30/2015  . Hammer toe of left foot 02/16/2015  . Fibrosis of skin of lower extremity 02/16/2015  . Status post right foot surgery 10/26/2014  . Cervical spondylosis with radiculopathy 03/01/2014  . Hammer toe of right foot 01/26/2014  . Benign intracranial hypertension 06/18/2013  . Disturbance of skin sensation 04/21/2013  . Cephalalgia 04/21/2013  . Porokeratosis 03/31/2013  . Vitamin D deficiency 04/18/2010  . Low back pain 11/29/2008  . MEDIAL MENISCUS TEAR, LEFT 06/30/2008  . BARRETTS ESOPHAGUS 06/28/2008  .  OSTEOARTHRITIS, KNEES, BILATERAL 06/28/2008  . OBESITY 05/24/2008  . HYPERTENSION, BENIGN ESSENTIAL 05/24/2008  . Irritable bowel syndrome 05/24/2008  . BARIATRIC SURGERY STATUS 03/19/2006  . NEOPLASM, MALIGNANT, COLON, HX OF 03/19/1997    Willow Ora, PTA, [facility] [address], [place], [place] [address] [phone] 02/04/19, 7:27 PM   Name: Misty [name] MRN: [id number] Date of Birth: 05/11/1960

## 2019-02-05 ENCOUNTER — Other Ambulatory Visit: Payer: Self-pay

## 2019-02-05 ENCOUNTER — Encounter: Payer: Self-pay | Admitting: Physical Therapy

## 2019-02-05 ENCOUNTER — Ambulatory Visit: Payer: Medicare Other | Admitting: Physical Therapy

## 2019-02-05 DIAGNOSIS — R2689 Other abnormalities of gait and mobility: Secondary | ICD-10-CM

## 2019-02-05 DIAGNOSIS — M6281 Muscle weakness (generalized): Secondary | ICD-10-CM | POA: Diagnosis not present

## 2019-02-05 DIAGNOSIS — R293 Abnormal posture: Secondary | ICD-10-CM

## 2019-02-05 DIAGNOSIS — R2681 Unsteadiness on feet: Secondary | ICD-10-CM | POA: Diagnosis not present

## 2019-02-06 NOTE — Therapy (Signed)
[facility] [address] [place], [place], [address] Phone: [phone]   Fax:  [phone]  Physical Therapy Treatment  Patient Details  Name: Misty [name] MRN: [id number] Date of Birth: Dec 20, 1960 Referring Provider (PT): Billey Gosling   Encounter Date: 02/05/2019  PT End of Session - 02/06/19 1957    Visit Number  6    Number of Visits  14    Date for PT Re-Evaluation  04/09/19    Authorization Type  Medicare/Medicaid-will need 10th visit progres note    PT Start Time  1535    PT Stop Time  1630   some time during session spent on TENS unit that would not come up to correct setting   PT Time Calculation (min)  55 min    Equipment Utilized During Treatment  Gait belt    Activity Tolerance  Patient limited by pain;Patient tolerated treatment well   limited by pain initially, reports less pain at end of session   Behavior During Therapy  [facility] for tasks assessed/performed       Past Medical History:  Diagnosis Date  . Allergy   . Arthritis   . Back pain   . Colon cancer ([place])   . Depression   . Diarrhea   . GERD (gastroesophageal reflux disease)   . Hypertension   . Insomnia   . MS (multiple sclerosis) ([place])   . Other hammer toe (acquired) 03/31/2013  . Pneumonia    hx of  . Umbilical hernia     Past Surgical History:  Procedure Laterality Date  . ABDOMINAL HYSTERECTOMY    . ANTERIOR CERVICAL DECOMP/DISCECTOMY FUSION N/A 03/01/2014   Procedure: ANTERIOR CERVICAL DECOMPRESSION/DISCECTOMY FUSION 1 LEVEL;  Surgeon: Charlie Pitter, MD;  Location: [facility];  Service: Neurosurgery;  Laterality: N/A;  ANTERIOR CERVICAL DECOMPRESSION/DISCECTOMY FUSION 1 LEVEL  . BUNIONECTOMY Right 10/14/2014   _0   . CESAREAN SECTION    . COLON SURGERY    . COLONOSCOPY    . FOOT SURGERY Right   . GASTRIC BYPASS    . Hammer Toe Repair Right 10/14/2014   RT #2, _1   . TENOTOMY Right 10/14/2014   RT #3, _2      There were no vitals filed for this visit.  Subjective Assessment - 02/05/19 1541    Subjective  Been doing too much, trying to move mattresses and have worked hard since 6 am this morning.    Pertinent History  OA bilateral hips, spondylosis low back, stenosis low back, chronic low back pain    Patient Stated Goals  Pt's goals are to have less pain and be able to move better.    Currently in Pain?  Yes    Pain Score  7     Pain Location  Back    Pain Orientation  Right    Pain Descriptors / Indicators  Aching;Sore;Tightness    Pain Type  Chronic pain    Pain Onset  More than a month ago    Pain Frequency  Intermittent    Aggravating Factors   increased activity, bending back hurts more    Pain Relieving Factors  medications, rest, heating pads                       [facility] Adult PT Treatment/Exercise - 02/06/19 1947      Ambulation/Gait   Ambulation/Gait  Yes    Ambulation/Gait Assistance  6: Modified independent (Device/Increase time)  Ambulation/Gait Assistance Details  Pt inquires about rollator walker; trial of rollator in clinic today, with pt noting improved posture, improved ease of gait, and decreased excessive trunk motion when compared to cane.    Ambulation Distance (Feet)  230 Feet   rollator; 115 ft cane   Assistive device  Straight cane;4-wheeled walker    Gait Pattern  Step-through pattern;Decreased stance time - right;Decreased weight shift to right;Antalgic;Trendelenburg    Ambulation Surface  Level;Indoor    Gait velocity  16.78 sec cane (1.95 ft/sec); 13.93 sec rollator (2.35 ft/sec    Gait Comments  Explained benefits of rollator walker, including improved posture, improved energy conservation for improved overall gait eficiency and safety.  Pt agreeable to PT requesting order from MD.      Self-Care   Self-Care  Other Self-Care Comments    Other Self-Care Comments   Discussed again the pool/aquatics availability, and pt is agreeable to  following up with pool.  PT let pt know she will expect to hear from Guido Sander, PT, regarding scheduling aquatic therapy.  Discussed posture and positioning with ADLs, including sitting positioning, positions for prolonged standing, for lifting and reaching.  Pt able to return demo understanding of these positions; however, she has difficulty with full squat to pick up items from floor, requiring cues for proper technique.      Modalities   Modalities  Electrical Stimulation;Moist Heat      Moist Heat Therapy   Number Minutes Moist Heat  8 Minutes    Moist Heat Location  Lumbar Spine      Electrical Stimulation   Electrical Stimulation Location  R lumbar musculature    Electrical Stimulation Action  TENS for pain control    Electrical Stimulation Parameters  10 minutes, TENS, 4 electrodes, 12 mA bilaterally    Electrical Stimulation Goals  --   Pt rates 0/10 pain at end of session            PT Education - 02/06/19 1956    Education Details  Handouts provided on body mechanics and positioning for ADLs/lifting    Person(s) Educated  Patient    Methods  Explanation;Demonstration;Handout    Comprehension  Verbalized understanding;Returned demonstration       PT Short Term Goals - 02/06/19 1958      PT SHORT TERM GOAL #1   Title  Pt will be independent with HEP for improved strength, decreased pain, improved balance.  TARGET 01/30/2019    Baseline  01/27/19: reports using handout to do these at home    Status  Achieved      PT SHORT TERM GOAL #2   Title  Pt will verbalize and demonstrate correct body mechanics and posture for ADL tasks and household activities.    Time  4    Period  Weeks    Status  Partially Met    Target Date  01/30/19      PT SHORT TERM GOAL #3   Title  Pt will improve TUG score to less than or equal to 15 seconds for decreased fall risk.    Baseline  01/27/19: 12.44 sec's no AD used    Status  Achieved      PT SHORT TERM GOAL #4   Title  Pt  will improve Berg Balance score to at least 40/56 for decreased fall risk.    Baseline  02/03/19: pt scored 51/56 today    Status  Achieved    Target Date  01/30/19  PT SHORT TERM GOAL #5   Title  Pt will verbalize fall prevention education for home environment.    Baseline  01/27/19: provided handout to pt today on fall prevention strategies. Pt able to return verbalization.    Status  Achieved        PT Long Term Goals - 02/04/19 1922      PT LONG TERM GOAL #1   Title  Pt will be independent with advanced HEP for improved balance, strength, decreased fall risk.  TARGET for all LTGs 02/13/2019    Time  7    Period  Weeks    Status  On-going      PT LONG TERM GOAL #2   Title  Pt will improve TUG score to less than or equal to 13.5 seconds for decreased fall risk.    Time  7    Period  Weeks    Status  On-going      PT LONG TERM GOAL #3   Title  Pt will improve Berg score to at least 45/56 for decreased fall risk.    Baseline  02/03/19: pt scored 51/56 today    Status  Achieved      PT LONG TERM GOAL #4   Title  Pt will improve gait velocity to at least 2.3 ft/sec for improved gait efficiency and safety.    Time  7    Period  Weeks    Status  On-going            Plan - 02/06/19 1958    Clinical Impression Statement  Pt has partially met STG 2 for body mechanics for positioing and ADLS; pt has been provided explanation and demonstration, and pt return demo undrestanding; however, unsure if pt's work during her moving process is exacerbating back pain.  With e-stim use today, pt reports no pain at end of session.  She would benefit from rollator walker for improved posture and gait efficiency/safety.    Personal Factors and Comorbidities  Comorbidity 3+    Comorbidities  OA bilateral hips, spondylosis low back, stenosis low back, chronic low back pain    Examination-Activity Limitations  Bend;Stairs;Lift;Locomotion Level    Examination-Participation Restrictions   Community Activity   Job as a Archivist  Evolving/Moderate complexity    Rehab Potential  Good    PT Frequency  2x / week    PT Duration  Other (comment)   7 weeks, including eval week   PT Treatment/Interventions  ADLs/Self Care Home Management;Aquatic Therapy;Electrical Stimulation;DME Instruction;Ultrasound;Traction;Moist Heat;Gait training;Stair training;Functional mobility training;Therapeutic activities;Therapeutic exercise;Balance training;Neuromuscular re-education;Patient/family education;Manual techniques;Energy conservation    PT Next Visit Plan  TENS until trial again and education on how to order for home use; work towards [place] (in current POC, she will need to schedule 1 additional week of appts for 6 total weeks); she needs to schedule aquatics 1x/wk and in clinic 1x/wk (may need to recert to capture aquatics dates)    Consulted and Agree with Plan of Care  Patient       Patient will benefit from skilled therapeutic intervention in order to improve the following deficits and impairments:  Abnormal gait, Decreased balance, Decreased mobility, Difficulty walking, Decreased strength, Improper body mechanics, Pain  Visit Diagnosis: Muscle weakness (generalized)  Abnormal posture  Other abnormalities of gait and mobility     Problem List Patient Active Problem List   Diagnosis Date Noted  . Hyperlipidemia 11/27/2018  . Anemia 11/25/2018  . Contact  dermatitis 09/17/2018  . Nausea 09/17/2018  . Right groin pain 07/16/2018  . Rash and nonspecific skin eruption 04/16/2018  . Depression 03/03/2018  . Right shoulder pain 03/03/2018  . Physical deconditioning 03/03/2018  . Pancreatitis 02/22/2018  . Alcohol abuse 02/22/2018  . Cholelithiasis 02/22/2018  . Adhesive capsulitis of right shoulder 01/20/2018  . Ventral hernia 09/16/2017  . Greater trochanteric bursitis of right hip 04/30/2017  . Prediabetes 04/16/2017  . Numbness 01/29/2017   . Chronic low back pain 07/20/2016  . Sacral mass 07/20/2016  . Unilateral primary osteoarthritis, right hip 07/20/2016  . Spinal stenosis of lumbar region 11/29/2015  . Positive urine drug screen 11/16/2015  . Chronic pain syndrome 10/12/2015  . Umbilical hernia 34/14/4360  . Sleeping difficulties 07/15/2015  . Bunion, right 06/20/2015  . Callus of foot 04/27/2015  . Muscle spasm 04/16/2015  . B12 deficiency 03/30/2015  . Hammer toe of left foot 02/16/2015  . Fibrosis of skin of lower extremity 02/16/2015  . Status post right foot surgery 10/26/2014  . Cervical spondylosis with radiculopathy 03/01/2014  . Hammer toe of right foot 01/26/2014  . Benign intracranial hypertension 06/18/2013  . Disturbance of skin sensation 04/21/2013  . Cephalalgia 04/21/2013  . Porokeratosis 03/31/2013  . Vitamin D deficiency 04/18/2010  . Low back pain 11/29/2008  . MEDIAL MENISCUS TEAR, LEFT 06/30/2008  . BARRETTS ESOPHAGUS 06/28/2008  . OSTEOARTHRITIS, KNEES, BILATERAL 06/28/2008  . OBESITY 05/24/2008  . HYPERTENSION, BENIGN ESSENTIAL 05/24/2008  . Irritable bowel syndrome 05/24/2008  . BARIATRIC SURGERY STATUS 03/19/2006  . NEOPLASM, MALIGNANT, COLON, HX OF 03/19/1997    Keeley Sussman W. 02/06/2019, 8:03 PM  Frazier Butt., PT   [facility] [address] [place], [place], [address] Phone: [phone]   Fax:  [phone]  Name: TYANNE DEROCHER MRN: [id number] Date of Birth: Mar 17, 1961

## 2019-02-09 ENCOUNTER — Ambulatory Visit: Payer: Medicare Other | Admitting: Physical Therapy

## 2019-02-09 ENCOUNTER — Telehealth: Payer: Self-pay | Admitting: Physical Therapy

## 2019-02-09 DIAGNOSIS — R2689 Other abnormalities of gait and mobility: Secondary | ICD-10-CM

## 2019-02-09 DIAGNOSIS — G8929 Other chronic pain: Secondary | ICD-10-CM

## 2019-02-09 DIAGNOSIS — M545 Low back pain, unspecified: Secondary | ICD-10-CM

## 2019-02-09 NOTE — Telephone Encounter (Signed)
Good morning, Misty [name] has been working with [name] for physical therapy over the past several weeks to address back pain and balance.  She would benefit from a rollator walker in order to provide stability and efficiency for gait.  If you agree, could you please write order for rollator walker and place in Epic?  Thank you.  Mady Haagensen, PT 02/09/19 8:23 AM Phone: [phone] Fax: [phone]

## 2019-02-10 ENCOUNTER — Ambulatory Visit: Payer: Medicare Other | Admitting: Physical Therapy

## 2019-02-10 NOTE — Telephone Encounter (Signed)
ordered

## 2019-02-11 ENCOUNTER — Other Ambulatory Visit: Payer: Self-pay | Admitting: Internal Medicine

## 2019-02-19 DIAGNOSIS — M545 Low back pain: Secondary | ICD-10-CM | POA: Diagnosis not present

## 2019-02-19 DIAGNOSIS — G894 Chronic pain syndrome: Secondary | ICD-10-CM | POA: Diagnosis not present

## 2019-02-19 DIAGNOSIS — Z79891 Long term (current) use of opiate analgesic: Secondary | ICD-10-CM | POA: Diagnosis not present

## 2019-02-25 ENCOUNTER — Other Ambulatory Visit: Payer: Self-pay | Admitting: Internal Medicine

## 2019-02-26 ENCOUNTER — Telehealth: Payer: Self-pay | Admitting: Internal Medicine

## 2019-02-26 MED ORDER — VENTOLIN HFA 108 (90 BASE) MCG/ACT IN AERS: 1.0000 | INHALATION_SPRAY | RESPIRATORY_TRACT | 5 refills | Status: DC | PRN

## 2019-02-26 NOTE — Telephone Encounter (Signed)
Patient requesting VENTOLIN HFA 108 (90 Base) MCG/ACT inhaler , informed please allow 48 to 72 hour turn around time, patient would like this request expedited  [place], [place] [address]

## 2019-02-27 ENCOUNTER — Telehealth: Payer: Self-pay | Admitting: *Deleted

## 2019-02-27 DIAGNOSIS — M25559 Pain in unspecified hip: Secondary | ICD-10-CM

## 2019-02-27 NOTE — Telephone Encounter (Signed)
Copied from [place] [phone]. Topic: General - Other >> Feb 26, 2019 11:20 AM Oneta Rack wrote: Patient states she spoke with PT and PCP Wellness nurse regarding a roller walker and a pick up stick due to ongoing hip pain, please advise

## 2019-03-02 NOTE — Telephone Encounter (Signed)
Ordered, printed

## 2019-03-03 ENCOUNTER — Other Ambulatory Visit: Payer: Self-pay

## 2019-03-03 ENCOUNTER — Ambulatory Visit (INDEPENDENT_AMBULATORY_CARE_PROVIDER_SITE_OTHER): Payer: Medicare Other | Admitting: Orthopaedic Surgery

## 2019-03-03 VITALS — Ht 64.0 in | Wt 261.0 lb

## 2019-03-03 DIAGNOSIS — M1611 Unilateral primary osteoarthritis, right hip: Secondary | ICD-10-CM

## 2019-03-03 DIAGNOSIS — G8929 Other chronic pain: Secondary | ICD-10-CM | POA: Diagnosis not present

## 2019-03-03 DIAGNOSIS — M25511 Pain in right shoulder: Secondary | ICD-10-CM

## 2019-03-03 NOTE — Progress Notes (Signed)
Office Visit Note   Patient: Misty [name]           Date of Birth: 02/19/1961           MRN: [id number] Visit Date: 03/03/2019              Requested by: [name], MD [place],  [place] [id number] PCP: [name], MD   Assessment & Plan: Visit Diagnoses:  1. Unilateral primary osteoarthritis, right hip   2. Chronic right shoulder pain     Plan: Impression is right hip osteoarthritis and right AC joint arthropathy.  We would like to refer the patient to Dr. Junius Roads for reinjection in both areas.  She will make all efforts at weight loss to get to a BMI of under 40.0 to proceed with right total hip arthroplasty.  Follow-up as needed.  Follow-Up Instructions: Return if symptoms worsen or fail to improve.   Orders:  No orders of the defined types were placed in this encounter.  No orders of the defined types were placed in this encounter.     Procedures: No procedures performed   Clinical Data: No additional findings.   Subjective: Chief Complaint  Patient presents with  . Right Hip - Pain    HPI patient is a pleasant 58 year old female who presents our clinic today with recurrent right hip and right shoulder pain.  In regards to her right hip, history of osteoarthritis.  She was seen by Dr. Junius Roads in May but this was injected.  She notes significant relief of symptoms until recently when she was moving apartments.  The pain has returned.  She does note pain to the groin.  Occasional pain to the right lower back.  Worse with ambulation.  Other issue she brings up is her right shoulder.  She has had pain to the [facility] joint for quite some time.  This was injected by Dr. Junius Roads in May with significant relief until the previously mentioned move a few weeks ago.  Her pain has returned.  Review of Systems as detailed in HPI.  All others reviewed and are negative.   Objective: Vital Signs: Ht 5\' 4"  (1.626 m)   Wt 261 lb (118.4 kg)   BMI 44.80 kg/m    Physical Exam well-developed well-nourished female in no acute distress.  Alert and oriented x3.  Ortho Exam examination of her right hip reveals a markedly positive logroll.  Negative straight leg raise.  Examination of the right shoulder reveals marked tenderness to the [facility] joint.  75% active range of motion.  She is neurovascular intact distally.  Specialty Comments:  No specialty comments available.  Imaging: No new imaging   PMFS History: Patient Active Problem List   Diagnosis Date Noted  . Hyperlipidemia 11/27/2018  . Anemia 11/25/2018  . Contact dermatitis 09/17/2018  . Nausea 09/17/2018  . Right groin pain 07/16/2018  . Rash and nonspecific skin eruption 04/16/2018  . Depression 03/03/2018  . Right shoulder pain 03/03/2018  . Physical deconditioning 03/03/2018  . Pancreatitis 02/22/2018  . Alcohol abuse 02/22/2018  . Cholelithiasis 02/22/2018  . Adhesive capsulitis of right shoulder 01/20/2018  . Ventral hernia 09/16/2017  . Greater trochanteric bursitis of right hip 04/30/2017  . Prediabetes 04/16/2017  . Numbness 01/29/2017  . Chronic low back pain 07/20/2016  . Sacral mass 07/20/2016  . Unilateral primary osteoarthritis, right hip 07/20/2016  . Spinal stenosis of lumbar region 11/29/2015  . Positive urine drug screen 11/16/2015  .  Chronic pain syndrome 10/12/2015  . Umbilical hernia [id number]  . Sleeping difficulties 07/15/2015  . Bunion, right 06/20/2015  . Callus of foot 04/27/2015  . Muscle spasm 04/16/2015  . B12 deficiency 03/30/2015  . Hammer toe of left foot 02/16/2015  . Fibrosis of skin of lower extremity 02/16/2015  . Status post right foot surgery 10/26/2014  . Cervical spondylosis with radiculopathy 03/01/2014  . Hammer toe of right foot 01/26/2014  . Benign intracranial hypertension 06/18/2013  . Disturbance of skin sensation 04/21/2013  . Cephalalgia 04/21/2013  . Porokeratosis 03/31/2013  . Vitamin D deficiency 04/18/2010  . Low back  pain 11/29/2008  . MEDIAL MENISCUS TEAR, LEFT 06/30/2008  . BARRETTS ESOPHAGUS 06/28/2008  . OSTEOARTHRITIS, KNEES, BILATERAL 06/28/2008  . OBESITY 05/24/2008  . HYPERTENSION, BENIGN ESSENTIAL 05/24/2008  . Irritable bowel syndrome 05/24/2008  . BARIATRIC SURGERY STATUS 03/19/2006  . NEOPLASM, MALIGNANT, COLON, HX OF 03/19/1997   Past Medical History:  Diagnosis Date  . Allergy   . Arthritis   . Back pain   . Colon cancer ([place])   . Depression   . Diarrhea   . GERD (gastroesophageal reflux disease)   . Hypertension   . Insomnia   . MS (multiple sclerosis) ([place])   . Other hammer toe (acquired) 03/31/2013  . Pneumonia    hx of  . Umbilical hernia     Family History  Problem Relation Age of Onset  . Stroke Mother   . Hypertension Mother   . Hyperlipidemia Mother   . Diabetes Mother   . Diabetes Brother   . Hypertension Brother   . Hypertension Brother   . Hypertension Brother   . Hypertension Brother   . Hypertension Brother   . Alcoholism Brother     Past Surgical History:  Procedure Laterality Date  . ABDOMINAL HYSTERECTOMY    . ANTERIOR CERVICAL DECOMP/DISCECTOMY FUSION N/A 03/01/2014   Procedure: ANTERIOR CERVICAL DECOMPRESSION/DISCECTOMY FUSION 1 LEVEL;  Surgeon: Charlie Pitter, MD;  Location: [facility];  Service: Neurosurgery;  Laterality: N/A;  ANTERIOR CERVICAL DECOMPRESSION/DISCECTOMY FUSION 1 LEVEL  . BUNIONECTOMY Right 10/14/2014   @[facility]   . CESAREAN SECTION    . COLON SURGERY    . COLONOSCOPY    . FOOT SURGERY Right   . GASTRIC BYPASS    . Hammer Toe Repair Right 10/14/2014   RT #2, @[facility]   . TENOTOMY Right 10/14/2014   RT #3, @[facility]    Social History   Occupational History  . Occupation: Disability  Tobacco Use  . Smoking status: Former Smoker    Packs/day: 0.25    Years: 20.00    Pack years: 5.00    Types: Cigarettes    Quit date: 09/22/2018    Years since quitting: 0.4  . Smokeless tobacco: Never Used  . Tobacco comment: states she quit 3 months  ago  Substance and Sexual Activity  . Alcohol use: Yes    Alcohol/week: 0.0 standard drinks    Comment: 1/2 pint daily  . Drug use: No  . Sexual activity: Yes    Partners: Male

## 2019-03-03 NOTE — Telephone Encounter (Signed)
Pt aware. [facility] to [facility].

## 2019-03-04 ENCOUNTER — Ambulatory Visit (INDEPENDENT_AMBULATORY_CARE_PROVIDER_SITE_OTHER): Payer: Medicare Other | Admitting: Family Medicine

## 2019-03-04 ENCOUNTER — Ambulatory Visit: Payer: Self-pay

## 2019-03-04 ENCOUNTER — Encounter: Payer: Self-pay | Admitting: Family Medicine

## 2019-03-04 DIAGNOSIS — M1611 Unilateral primary osteoarthritis, right hip: Secondary | ICD-10-CM | POA: Diagnosis not present

## 2019-03-04 DIAGNOSIS — G8929 Other chronic pain: Secondary | ICD-10-CM | POA: Diagnosis not present

## 2019-03-04 DIAGNOSIS — M25511 Pain in right shoulder: Secondary | ICD-10-CM | POA: Diagnosis not present

## 2019-03-04 NOTE — Progress Notes (Signed)
Office Visit Note   Patient: Misty [name]           Date of Birth: December 19, 1960           MRN: [id number] Visit Date: 03/04/2019 Requested by: [name], MD [place],  [place] [id number] PCP: [name], MD  Subjective: Chief Complaint  Patient presents with  . Right Shoulder - Injections  . Right Hip - Injections    HPI: She is here for planned injections of right glenohumeral joint and right hip under ultrasound guidance.  She has DJD in both areas.              ROS:   All other systems were reviewed and are negative.  Objective: Vital Signs: There were no vitals taken for this visit.  Physical Exam:  General:  Alert and oriented, in no acute distress. Pulm:  Breathing unlabored. Psy:  Normal mood, congruent affect.  Right shoulder: Limited range of motion with overhead reach. Right hip: Pain with passive internal rotation.  Imaging: None today  Assessment & Plan: 1.  Right shoulder and hip DJD -Ultrasound-guided injections in both areas.  Follow-up as directed with Dr. Erlinda Hong.     Procedures:  Procedure: Ultrasound-guided right glenohumeral injection: After sterile prep with Betadine, injected 8 cc 1% lidocaine without epinephrine and 40 mg methylprednisolone using a 22-gauge spinal needle, passing the needle through approach into the glenohumeral joint.  Injectate seen filling the joint capsule.  Good immediate relief.  Procedure: Ultrasound-guided right hip injection: After sterile prep with Betadine, injected 8 cc 1% lidocaine without epinephrine and 40 mg methylprednisolone using a 22-gauge spinal needle, passing the needle through the iliofemoral ligament into the femoral head/neck junction.  Injectate was seen filling the joint capsule.  Good immediate relief.      PMFS History: Patient Active Problem List   Diagnosis Date Noted  . Hyperlipidemia 11/27/2018  . Anemia 11/25/2018  . Contact dermatitis 09/17/2018  . Nausea 09/17/2018    . Right groin pain 07/16/2018  . Rash and nonspecific skin eruption 04/16/2018  . Depression 03/03/2018  . Right shoulder pain 03/03/2018  . Physical deconditioning 03/03/2018  . Pancreatitis 02/22/2018  . Alcohol abuse 02/22/2018  . Cholelithiasis 02/22/2018  . Adhesive capsulitis of right shoulder 01/20/2018  . Ventral hernia 09/16/2017  . Greater trochanteric bursitis of right hip 04/30/2017  . Prediabetes 04/16/2017  . Numbness 01/29/2017  . Chronic low back pain 07/20/2016  . Sacral mass 07/20/2016  . Unilateral primary osteoarthritis, right hip 07/20/2016  . Spinal stenosis of lumbar region 11/29/2015  . Positive urine drug screen 11/16/2015  . Chronic pain syndrome 10/12/2015  . Umbilical hernia [id number]  . Sleeping difficulties 07/15/2015  . Bunion, right 06/20/2015  . Callus of foot 04/27/2015  . Muscle spasm 04/16/2015  . B12 deficiency 03/30/2015  . Hammer toe of left foot 02/16/2015  . Fibrosis of skin of lower extremity 02/16/2015  . Status post right foot surgery 10/26/2014  . Cervical spondylosis with radiculopathy 03/01/2014  . Hammer toe of right foot 01/26/2014  . Benign intracranial hypertension 06/18/2013  . Disturbance of skin sensation 04/21/2013  . Cephalalgia 04/21/2013  . Porokeratosis 03/31/2013  . Vitamin D deficiency 04/18/2010  . Low back pain 11/29/2008  . MEDIAL MENISCUS TEAR, LEFT 06/30/2008  . BARRETTS ESOPHAGUS 06/28/2008  . OSTEOARTHRITIS, KNEES, BILATERAL 06/28/2008  . OBESITY 05/24/2008  . HYPERTENSION, BENIGN ESSENTIAL 05/24/2008  . Irritable bowel syndrome 05/24/2008  . BARIATRIC  SURGERY STATUS 03/19/2006  . NEOPLASM, MALIGNANT, COLON, HX OF 03/19/1997   Past Medical History:  Diagnosis Date  . Allergy   . Arthritis   . Back pain   . Colon cancer ([place])   . Depression   . Diarrhea   . GERD (gastroesophageal reflux disease)   . Hypertension   . Insomnia   . MS (multiple sclerosis) ([place])   . Other hammer toe (acquired)  03/31/2013  . Pneumonia    hx of  . Umbilical hernia     Family History  Problem Relation Age of Onset  . Stroke Mother   . Hypertension Mother   . Hyperlipidemia Mother   . Diabetes Mother   . Diabetes Brother   . Hypertension Brother   . Hypertension Brother   . Hypertension Brother   . Hypertension Brother   . Hypertension Brother   . Alcoholism Brother     Past Surgical History:  Procedure Laterality Date  . ABDOMINAL HYSTERECTOMY    . ANTERIOR CERVICAL DECOMP/DISCECTOMY FUSION N/A 03/01/2014   Procedure: ANTERIOR CERVICAL DECOMPRESSION/DISCECTOMY FUSION 1 LEVEL;  Surgeon: Charlie Pitter, MD;  Location: [facility];  Service: Neurosurgery;  Laterality: N/A;  ANTERIOR CERVICAL DECOMPRESSION/DISCECTOMY FUSION 1 LEVEL  . BUNIONECTOMY Right 10/14/2014   @[facility]   . CESAREAN SECTION    . COLON SURGERY    . COLONOSCOPY    . FOOT SURGERY Right   . GASTRIC BYPASS    . Hammer Toe Repair Right 10/14/2014   RT #2, @[facility]   . TENOTOMY Right 10/14/2014   RT #3, @[facility]    Social History   Occupational History  . Occupation: Disability  Tobacco Use  . Smoking status: Former Smoker    Packs/day: 0.25    Years: 20.00    Pack years: 5.00    Types: Cigarettes    Quit date: 09/22/2018    Years since quitting: 0.4  . Smokeless tobacco: Never Used  . Tobacco comment: states she quit 3 months ago  Substance and Sexual Activity  . Alcohol use: Yes    Alcohol/week: 0.0 standard drinks    Comment: 1/2 pint daily  . Drug use: No  . Sexual activity: Yes    Partners: Male

## 2019-03-09 NOTE — Progress Notes (Deleted)
  Subjective:    Patient ID: Misty [name], female    DOB: 01/22/1961, 58 y.o.   MRN: [id number]  HPI The patient is here for an acute visit.   Recurrent falls:      Medications and allergies reviewed with patient and updated if appropriate.  Patient Active Problem List   Diagnosis Date Noted  . Hyperlipidemia 11/27/2018  . Anemia 11/25/2018  . Contact dermatitis 09/17/2018  . Nausea 09/17/2018  . Right groin pain 07/16/2018  . Rash and nonspecific skin eruption 04/16/2018  . Depression 03/03/2018  . Right shoulder pain 03/03/2018  . Physical deconditioning 03/03/2018  . Pancreatitis 02/22/2018  . Alcohol abuse 02/22/2018  . Cholelithiasis 02/22/2018  . Adhesive capsulitis of right shoulder 01/20/2018  . Ventral hernia 09/16/2017  . Greater trochanteric bursitis of right hip 04/30/2017  . Prediabetes 04/16/2017  . Numbness 01/29/2017  . Chronic low back pain 07/20/2016  . Sacral mass 07/20/2016  . Unilateral primary osteoarthritis, right hip 07/20/2016  . Spinal stenosis of lumbar region 11/29/2015  . Positive urine drug screen 11/16/2015  . Chronic pain syndrome 10/12/2015  . Umbilical hernia 10/12/2015  . Sleeping difficulties 07/15/2015  . Bunion, right 06/20/2015  . Callus of foot 04/27/2015  . Muscle spasm 04/16/2015  . B12 deficiency 03/30/2015  . Hammer toe of left foot 02/16/2015  . Fibrosis of skin of lower extremity 02/16/2015  . Status post right foot surgery 10/26/2014  . Cervical spondylosis with radiculopathy 03/01/2014  . Hammer toe of right foot 01/26/2014  . Benign intracranial hypertension 06/18/2013  . Disturbance of skin sensation 04/21/2013  . Cephalalgia 04/21/2013  . Porokeratosis 03/31/2013  . Vitamin D deficiency 04/18/2010  . Low back pain 11/29/2008  . MEDIAL MENISCUS TEAR, LEFT 06/30/2008  . BARRETTS ESOPHAGUS 06/28/2008  . OSTEOARTHRITIS, KNEES, BILATERAL 06/28/2008  . OBESITY 05/24/2008  . HYPERTENSION, BENIGN ESSENTIAL  05/24/2008  . Irritable bowel syndrome 05/24/2008  . BARIATRIC SURGERY STATUS 03/19/2006  . NEOPLASM, MALIGNANT, COLON, HX OF 03/19/1997    Current Outpatient Medications on File Prior to Visit  Medication Sig Dispense Refill  . diclofenac sodium (VOLTAREN) 1 % GEL Apply 2 g topically 4 (four) times daily. 100 g 5  . gabapentin (NEURONTIN) 300 MG capsule One po qAM, two po q PM and 2 po q HS 150 capsule 5  . hydrochlorothiazide (MICROZIDE) 12.5 MG capsule TAKE 1 CAPSULE BY MOUTH DAILY AS NEEDED FOR  SWELLING  IN  LEGS  OR  ELEVATED  BLOOD  PRESSURE 90 capsule 0  . lisinopril (ZESTRIL) 20 MG tablet Take 1 tablet by mouth once daily 90 tablet 0  . meloxicam (MOBIC) 15 MG tablet TAKE 1 TABLET BY MOUTH ONCE DAILY WITH FOOD 90 tablet 1  . mirtazapine (REMERON) 15 MG tablet Take 1 tablet (15 mg total) by mouth at bedtime. 30 tablet 5  . NARCAN 4 MG/0.1ML LIQD nasal spray kit SPRAY 0.1 ML (4MG) IN 1 NOSTRIL BY INTRANASAL ROUTE. MAY REPEAT DOSE EVERY 2 TO 3 MINUTES AS NEEDED ALTERNATING NOSTRILS WITH EACH DOSE    . omeprazole (PRILOSEC) 40 MG capsule Take 1 capsule (40 mg total) by mouth daily. 30 capsule 5  . ondansetron (ZOFRAN) 4 MG tablet Take 1 tablet (4 mg total) by mouth every 6 (six) hours as needed for nausea. 20 tablet 3  . Oxycodone HCl 10 MG TABS Take 10 mg by mouth 5 (five) times daily.     . Syringe/Needle, Disp, (SYRINGE 3CC/25GX1") 25G X 1"   3 ML MISC Use for monthly B12 injections 3 each 3  . triamcinolone cream (KENALOG) 0.5 % APPLY 1 APPLICATION TOPICALLY 3 TIMES DAILY 30 g 0  . VENTOLIN HFA 108 (90 Base) MCG/ACT inhaler Inhale 1-2 puffs into the lungs every 4 (four) hours as needed for wheezing or shortness of breath. 6.7 g 5  . XTAMPZA ER 13.5 MG C12A      No current facility-administered medications on file prior to visit.    Past Medical History:  Diagnosis Date  . Allergy   . Arthritis   . Back pain   . Colon cancer ([facility])   . Depression   . Diarrhea   . GERD  (gastroesophageal reflux disease)   . Hypertension   . Insomnia   . MS (multiple sclerosis) ([facility])   . Other hammer toe (acquired) 03/31/2013  . Pneumonia    hx of  . Umbilical hernia     Past Surgical History:  Procedure Laterality Date  . ABDOMINAL HYSTERECTOMY    . ANTERIOR CERVICAL DECOMP/DISCECTOMY FUSION N/A 03/01/2014   Procedure: ANTERIOR CERVICAL DECOMPRESSION/DISCECTOMY FUSION 1 LEVEL;  Surgeon: Henry A Pool, MD;  Location: [facility];  Service: Neurosurgery;  Laterality: N/A;  ANTERIOR CERVICAL DECOMPRESSION/DISCECTOMY FUSION 1 LEVEL  . BUNIONECTOMY Right 10/14/2014   @[facility]  . CESAREAN SECTION    . COLON SURGERY    . COLONOSCOPY    . FOOT SURGERY Right   . GASTRIC BYPASS    . Hammer Toe Repair Right 10/14/2014   RT #2, @[facility]  . TENOTOMY Right 10/14/2014   RT #3, @[facility]    Social History   Socioeconomic History  . Marital status: Single    Spouse name: Not on file  . Number of children: 2  . Years of education: Not on file  . Highest education level: Not on file  Occupational History  . Occupation: Disability  Tobacco Use  . Smoking status: Former Smoker    Packs/day: 0.25    Years: 20.00    Pack years: 5.00    Types: Cigarettes    Quit date: 09/22/2018    Years since quitting: 0.4  . Smokeless tobacco: Never Used  . Tobacco comment: states she quit 3 months ago  Substance and Sexual Activity  . Alcohol use: Yes    Alcohol/week: 0.0 standard drinks    Comment: 1/2 pint daily  . Drug use: No  . Sexual activity: Yes    Partners: Male  Other Topics Concern  . Not on file  Social History Narrative  . Not on file   Social Determinants of Health   Financial Resource Strain:   . Difficulty of Paying Living Expenses: Not on file  Food Insecurity:   . Worried About Running Out of Food in the Last Year: Not on file  . Ran Out of Food in the Last Year: Not on file  Transportation Needs:   . Lack of Transportation (Medical): Not on file  . Lack of  Transportation (Non-Medical): Not on file  Physical Activity:   . Days of Exercise per Week: Not on file  . Minutes of Exercise per Session: Not on file  Stress:   . Feeling of Stress : Not on file  Social Connections:   . Frequency of Communication with Friends and Family: Not on file  . Frequency of Social Gatherings with Friends and Family: Not on file  . Attends Religious Services: Not on file  . Active Member of Clubs or Organizations: Not   on file  . Attends Archivist Meetings: Not on file  . Marital Status: Not on file    Family History  Problem Relation Age of Onset  . Stroke Mother   . Hypertension Mother   . Hyperlipidemia Mother   . Diabetes Mother   . Diabetes Brother   . Hypertension Brother   . Hypertension Brother   . Hypertension Brother   . Hypertension Brother   . Hypertension Brother   . Alcoholism Brother     Review of Systems     Objective:  There were no vitals filed for this visit. BP Readings from Last 3 Encounters:  11/25/18 110/64  04/16/18 114/66  03/03/18 124/78   Wt Readings from Last 3 Encounters:  03/03/19 261 lb (118.4 kg)  11/27/18 268 lb (121.6 kg)  11/25/18 263 lb 12.8 oz (119.7 kg)   There is no height or weight on file to calculate BMI.   Physical Exam         Assessment & Plan:    See Problem List for Assessment and Plan of chronic medical problems.     This visit occurred during the SARS-CoV-2 public health emergency.  Safety protocols were in place, including screening questions prior to the visit, additional usage of staff PPE, and extensive cleaning of exam room while observing appropriate contact time as indicated for disinfecting solutions.

## 2019-03-10 ENCOUNTER — Encounter: Payer: Self-pay | Admitting: Internal Medicine

## 2019-03-10 ENCOUNTER — Ambulatory Visit (INDEPENDENT_AMBULATORY_CARE_PROVIDER_SITE_OTHER): Payer: Medicare Other | Admitting: Internal Medicine

## 2019-03-10 DIAGNOSIS — R350 Frequency of micturition: Secondary | ICD-10-CM | POA: Diagnosis not present

## 2019-03-10 DIAGNOSIS — M1611 Unilateral primary osteoarthritis, right hip: Secondary | ICD-10-CM | POA: Diagnosis not present

## 2019-03-10 DIAGNOSIS — G479 Sleep disorder, unspecified: Secondary | ICD-10-CM

## 2019-03-10 DIAGNOSIS — G2581 Restless legs syndrome: Secondary | ICD-10-CM | POA: Diagnosis not present

## 2019-03-10 MED ORDER — MIRABEGRON ER 25 MG PO TB24: 25.0000 mg | ORAL_TABLET | Freq: Every day | ORAL | 5 refills | Status: DC

## 2019-03-10 MED ORDER — ROPINIROLE HCL 0.25 MG PO TABS: ORAL_TABLET | ORAL | 3 refills | Status: DC

## 2019-03-10 NOTE — Assessment & Plan Note (Signed)
New problem Her symptoms are consistent with probable restless leg syndrome Stop mirtazapine Start Requip 0.25 mg nightly x2 nights, then increase to 2 pills nightly She will update me in about a week and how her symptoms are Continue gabapentin-can consider increasing nighttime dose

## 2019-03-10 NOTE — Assessment & Plan Note (Signed)
Frequent urination during the day and at night She does not think she has UTI-denies any other symptoms besides frequency No change in medications She has had an overactive bladder in the past and feels that is what this is and is interested in trying medication Trial of Myrbetriq 25 mg daily If symptoms worsen or do not improve need to check UA, urine culture If medication is helpful can consider increasing it

## 2019-03-10 NOTE — Assessment & Plan Note (Signed)
Multifactorial She is currently taking mirtazapine, but does not take it every night-this was initially started for some depression and sleep issues.  Her depression has improved.  She now mainly takes it for sleep Stop Myrbetriq We will start Requip for likely RLS Can consider increasing gabapentin, but we will see how she does with the Requip

## 2019-03-10 NOTE — Assessment & Plan Note (Signed)
Following with orthopedics Had an injection last week and that has helped Because of the hip pain and weakness she has had falls, but since the injection she is not falling and is currently not using her cane She was informed by orthopedics that she will not be able to have any other injections and needs to lose weight in order to have surgery Discussed weight loss

## 2019-03-10 NOTE — Progress Notes (Signed)
Virtual Visit via Video Note  I connected with Misty [name] on 03/10/19 at 11:15 AM EST by a video enabled telemedicine application and verified that I am speaking with the correct person using two identifiers.   I discussed the limitations of evaluation and management by telemedicine and the availability of in person appointments. The patient expressed understanding and agreed to proceed.  Present for the visit:  Myself, Dr Billey Gosling, Misty [name].  The patient is currently at home and I am in the office.    No referring provider.    History of Present Illness: This is an acute visit for recurrent falls, urine frequency.   Frequent urination, can not make it to bathroom.  She has been experiencing frequent urination for about a month.  It occurs during the day and at night.  She can get up 5-6 times at night and she is not sleeping well.  She does not hurry she will not make it to the bathroom.  She states she had this in the past and wore a patch for her bladder and that helped significantly.  She denies any dysuria, hematuria, change in urine odor or color.    Her legs are heavy at times, light at times.  She can not rest. Her legs feel restless.  Her legs feel funny.  Bad tingling in hands and legs.  This is been going on for a while and she is unsure why.  She even feels it during the day.  She thinks it is worse since she had her hip injections last week.   BP good at home.    Falling due to weakness in legs.  She had injection for her right hip last week and she is doing better.  She is not using her cane now.  That was her last injection - she needs to lose weight to have surgery.  She states she typically only has 1 meal a day, but states she does snack.  She is not exercising regularly.   Review of Systems  Constitutional: Negative for chills and fever.  Gastrointestinal: Negative for abdominal pain and nausea.  Genitourinary: Positive for frequency. Negative for  dysuria and hematuria.       No change in odor or color     Social History   Socioeconomic History  . Marital status: Single    Spouse name: Not on file  . Number of children: 2  . Years of education: Not on file  . Highest education level: Not on file  Occupational History  . Occupation: Disability  Tobacco Use  . Smoking status: Former Smoker    Packs/day: 0.25    Years: 20.00    Pack years: 5.00    Types: Cigarettes    Quit date: 09/22/2018    Years since quitting: 0.4  . Smokeless tobacco: Never Used  . Tobacco comment: states she quit 3 months ago  Substance and Sexual Activity  . Alcohol use: Yes    Alcohol/week: 0.0 standard drinks    Comment: 1/2 pint daily  . Drug use: No  . Sexual activity: Yes    Partners: Male  Other Topics Concern  . Not on file  Social History Narrative  . Not on file   Social Determinants of Health   Financial Resource Strain:   . Difficulty of Paying Living Expenses: Not on file  Food Insecurity:   . Worried About Charity fundraiser in the Last Year: Not on file  .  Ran Out of Food in the Last Year: Not on file  Transportation Needs:   . Lack of Transportation (Medical): Not on file  . Lack of Transportation (Non-Medical): Not on file  Physical Activity:   . Days of Exercise per Week: Not on file  . Minutes of Exercise per Session: Not on file  Stress:   . Feeling of Stress : Not on file  Social Connections:   . Frequency of Communication with Friends and Family: Not on file  . Frequency of Social Gatherings with Friends and Family: Not on file  . Attends Religious Services: Not on file  . Active Member of Clubs or Organizations: Not on file  . Attends Archivist Meetings: Not on file  . Marital Status: Not on file     Observations/Objective: Appears well in NAD Breathing normally  Assessment and Plan:  See Problem List for Assessment and Plan of chronic medical problems.   Follow Up Instructions:    I  discussed the assessment and treatment plan with the patient. The patient was provided an opportunity to ask questions and all were answered. The patient agreed with the plan and demonstrated an understanding of the instructions.   The patient was advised to call back or seek an in-person evaluation if the symptoms worsen or if the condition fails to improve as anticipated.    [name], MD

## 2019-03-17 ENCOUNTER — Telehealth: Payer: Self-pay

## 2019-03-17 NOTE — Telephone Encounter (Signed)
Spoke with patient today and info given. Will follow up with her on Thursday to see if she has been contacted yet.

## 2019-03-17 NOTE — Telephone Encounter (Signed)
Response from [place]:  [place], Boyce Medici, [place]; [place], Stanford Breed, Lenna Sciara; Jonn Shingles L  She got a cane 01/02/2019. Insurance will not cover 2 same or similar items within 5 years. The rolling walker will be private pay.   I will pull the order and place the pp order and have a processor call her to take payment over the phone and she can go instore or have it mailed to her.   Thanks

## 2019-03-17 NOTE — Telephone Encounter (Signed)
Resent message today to Levada Dy and Lenna Sciara to follow up on status of DME for patient.

## 2019-04-16 ENCOUNTER — Ambulatory Visit: Payer: Medicare Other | Attending: Internal Medicine

## 2019-04-16 DIAGNOSIS — H04123 Dry eye syndrome of bilateral lacrimal glands: Secondary | ICD-10-CM | POA: Diagnosis not present

## 2019-04-16 DIAGNOSIS — Z20822 Contact with and (suspected) exposure to covid-19: Secondary | ICD-10-CM | POA: Diagnosis not present

## 2019-04-16 DIAGNOSIS — H35363 Drusen (degenerative) of macula, bilateral: Secondary | ICD-10-CM | POA: Diagnosis not present

## 2019-04-16 DIAGNOSIS — H35033 Hypertensive retinopathy, bilateral: Secondary | ICD-10-CM | POA: Diagnosis not present

## 2019-04-16 DIAGNOSIS — H524 Presbyopia: Secondary | ICD-10-CM | POA: Diagnosis not present

## 2019-04-16 DIAGNOSIS — H2513 Age-related nuclear cataract, bilateral: Secondary | ICD-10-CM | POA: Diagnosis not present

## 2019-04-17 ENCOUNTER — Telehealth: Payer: Self-pay | Admitting: *Deleted

## 2019-04-17 LAB — NOVEL CORONAVIRUS, NAA: SARS-CoV-2, NAA: DETECTED — AB

## 2019-04-17 NOTE — Telephone Encounter (Signed)
Pt called to ask questions in regarding if she should get retested and if it is safe to take care of her mom.  The patient tested positive on the 18 th and got retested again on the 28 th. She was at the end of her quarantine and should have waited at least the 14 days if she needed to get a negative result. She is educated on recommendation of waiting 3 months or 90 days before getting retested. Her mom also tested positive and needs a caregiver. She is advised to wear her mask around her and to gown up if possible and clean and disinfect the house, She voiced understanding.

## 2019-04-18 ENCOUNTER — Telehealth (HOSPITAL_COMMUNITY): Payer: Self-pay | Admitting: Nurse Practitioner

## 2019-04-18 DIAGNOSIS — U071 COVID-19: Secondary | ICD-10-CM | POA: Insufficient documentation

## 2019-04-18 NOTE — Telephone Encounter (Signed)
Called to discuss with [name] about Covid symptoms and the use of bamlanivimab, a monoclonal antibody infusion for those with mild to moderate Covid symptoms and at a high risk of hospitalization.    Pt does not qualify for infusion therapy as her symptoms first presented > 10 days prior to timing of infusion. Symptoms tier reviewed as well as criteria for ending isolation. Preventative practices reviewed. Patient verbalized understanding   Patient Active Problem List   Diagnosis Date Noted  . Urinary frequency 03/10/2019  . Restless leg syndrome 03/10/2019  . Hyperlipidemia 11/27/2018  . Anemia 11/25/2018  . Contact dermatitis 09/17/2018  . Nausea 09/17/2018  . Right groin pain 07/16/2018  . Rash and nonspecific skin eruption 04/16/2018  . Depression 03/03/2018  . Right shoulder pain 03/03/2018  . Physical deconditioning 03/03/2018  . Pancreatitis 02/22/2018  . Alcohol abuse 02/22/2018  . Cholelithiasis 02/22/2018  . Adhesive capsulitis of right shoulder 01/20/2018  . Ventral hernia 09/16/2017  . Greater trochanteric bursitis of right hip 04/30/2017  . Prediabetes 04/16/2017  . Numbness 01/29/2017  . Chronic low back pain 07/20/2016  . Sacral mass 07/20/2016  . Unilateral primary osteoarthritis, right hip 07/20/2016  . Spinal stenosis of lumbar region 11/29/2015  . Positive urine drug screen 11/16/2015  . Chronic pain syndrome 10/12/2015  . Umbilical hernia [id number]  . Sleeping difficulties 07/15/2015  . Bunion, right 06/20/2015  . Callus of foot 04/27/2015  . Muscle spasm 04/16/2015  . B12 deficiency 03/30/2015  . Hammer toe of left foot 02/16/2015  . Fibrosis of skin of lower extremity 02/16/2015  . Status post right foot surgery 10/26/2014  . Cervical spondylosis with radiculopathy 03/01/2014  . Hammer toe of right foot 01/26/2014  . Benign intracranial hypertension 06/18/2013  . Disturbance of skin sensation 04/21/2013  . Cephalalgia 04/21/2013  .  Porokeratosis 03/31/2013  . Vitamin D deficiency 04/18/2010  . Low back pain 11/29/2008  . MEDIAL MENISCUS TEAR, LEFT 06/30/2008  . BARRETTS ESOPHAGUS 06/28/2008  . OSTEOARTHRITIS, KNEES, BILATERAL 06/28/2008  . OBESITY 05/24/2008  . HYPERTENSION, BENIGN ESSENTIAL 05/24/2008  . Irritable bowel syndrome 05/24/2008  . BARIATRIC SURGERY STATUS 03/19/2006  . NEOPLASM, MALIGNANT, COLON, HX OF 03/19/1997    Beckey Rutter, DNP, AGNP-C [facility]  [email or web address] Main Line: [id number] ([place])

## 2019-04-22 DIAGNOSIS — G894 Chronic pain syndrome: Secondary | ICD-10-CM | POA: Diagnosis not present

## 2019-04-22 DIAGNOSIS — M545 Low back pain: Secondary | ICD-10-CM | POA: Diagnosis not present

## 2019-04-22 DIAGNOSIS — Z79891 Long term (current) use of opiate analgesic: Secondary | ICD-10-CM | POA: Diagnosis not present

## 2019-04-22 DIAGNOSIS — M25551 Pain in right hip: Secondary | ICD-10-CM | POA: Diagnosis not present

## 2019-04-23 ENCOUNTER — Other Ambulatory Visit: Payer: Self-pay | Admitting: Internal Medicine

## 2019-04-23 NOTE — Telephone Encounter (Addendum)
° ° ° °  Patient states she has been out of medication for 5-6 days. Patient states she is in pain and having anxiety. Requesting call from [place]   1. Which medications need to be refilled? (please list name of each medication and dose if known) gabapentin (NEURONTIN) 300 MG capsule 2. Which pharmacy/location (including street and city if local pharmacy) is medication to be sent to? [facility]  3. Do they need a 30 day or 90 day supply? [place]

## 2019-04-24 NOTE — Telephone Encounter (Signed)
Needs an appt to discuss meds and side effects

## 2019-04-24 NOTE — Telephone Encounter (Signed)
Pt wants to know if you can send her something for anxiety.

## 2019-04-29 ENCOUNTER — Encounter: Payer: Self-pay | Admitting: Physical Therapy

## 2019-04-29 NOTE — Therapy (Signed)
[facility] [address] [place], [place], [address] Phone: [phone]   Fax:  [phone]  Patient Details  Name: Misty [name] MRN: [id number] Date of Birth: [birth year] Referring Provider:  No ref. provider found  Encounter Date: 04/29/2019  PHYSICAL THERAPY DISCHARGE SUMMARY  Visits from Start of Care: 6  Current functional level related to goals / functional outcomes: PT Short Term Goals - 02/06/19 1958      PT SHORT TERM GOAL #1   Title  Pt will be independent with HEP for improved strength, decreased pain, improved balance.  TARGET 01/30/2019    Baseline  01/27/19: reports using handout to do these at home    Status  Achieved      PT SHORT TERM GOAL #2   Title  Pt will verbalize and demonstrate correct body mechanics and posture for ADL tasks and household activities.    Time  4    Period  Weeks    Status  Partially Met    Target Date  01/30/19      PT SHORT TERM GOAL #3   Title  Pt will improve TUG score to less than or equal to 15 seconds for decreased fall risk.    Baseline  01/27/19: 12.44 sec's no AD used    Status  Achieved      PT SHORT TERM GOAL #4   Title  Pt will improve Berg Balance score to at least 40/56 for decreased fall risk.    Baseline  02/03/19: pt scored 51/56 today    Status  Achieved    Target Date  01/30/19      PT SHORT TERM GOAL #5   Title  Pt will verbalize fall prevention education for home environment.    Baseline  01/27/19: provided handout to pt today on fall prevention strategies. Pt able to return verbalization.    Status  Achieved      Pt had met several STGs; LTGs not fully able to be assessed, due to pt not returning to therapy after 02/05/2019 visit.   Remaining deficits: Pain, decreased balance   Education / Equipment: Educated in [employer], Economist, fall prevention  Plan: Patient agrees to discharge.  Patient goals were not met. Patient is being  discharged due to not returning since the last visit.  ?????       Taijon Vink W. 04/29/2019, 2:30 PM Mady Haagensen, PT 04/29/19 2:32 PM Phone: [phone] Fax: [facility] [address] [place], [place], [address] Phone: [phone]   Fax:  [phone]

## 2019-05-19 ENCOUNTER — Other Ambulatory Visit: Payer: Self-pay | Admitting: Internal Medicine

## 2019-05-20 DIAGNOSIS — M25551 Pain in right hip: Secondary | ICD-10-CM | POA: Diagnosis not present

## 2019-05-20 DIAGNOSIS — Z79891 Long term (current) use of opiate analgesic: Secondary | ICD-10-CM | POA: Diagnosis not present

## 2019-05-20 DIAGNOSIS — G894 Chronic pain syndrome: Secondary | ICD-10-CM | POA: Diagnosis not present

## 2019-05-20 DIAGNOSIS — M545 Low back pain: Secondary | ICD-10-CM | POA: Diagnosis not present

## 2019-06-11 NOTE — Progress Notes (Signed)
Subjective:    Patient ID: Misty [name], female    DOB: 05/15/1960, 59 y.o.   MRN: [id number]  HPI The patient is here for an acute visit.  One month ago she was using a heating pad and burned her right lateral lower leg.  It has not healed since then.  She has been putting on a medicated guaze.  She did let it dry out a little.  It does hurt.  There is increased redness around the wound.  She denies bleeding or discharge. She denies fever.        Medications and allergies reviewed with patient and updated if appropriate.  Patient Active Problem List   Diagnosis Date Noted  . Leg ulcer ([place]) 06/12/2019  . COVID-19 virus infection 04/18/2019  . Urinary frequency 03/10/2019  . Restless leg syndrome 03/10/2019  . Hyperlipidemia 11/27/2018  . Anemia 11/25/2018  . Contact dermatitis 09/17/2018  . Nausea 09/17/2018  . Right groin pain 07/16/2018  . Rash and nonspecific skin eruption 04/16/2018  . Depression 03/03/2018  . Right shoulder pain 03/03/2018  . Physical deconditioning 03/03/2018  . Pancreatitis 02/22/2018  . Alcohol abuse 02/22/2018  . Cholelithiasis 02/22/2018  . Adhesive capsulitis of right shoulder 01/20/2018  . Ventral hernia 09/16/2017  . Greater trochanteric bursitis of right hip 04/30/2017  . Prediabetes 04/16/2017  . Numbness 01/29/2017  . Chronic low back pain 07/20/2016  . Sacral mass 07/20/2016  . Unilateral primary osteoarthritis, right hip 07/20/2016  . Spinal stenosis of lumbar region 11/29/2015  . Positive urine drug screen 11/16/2015  . Chronic pain syndrome 10/12/2015  . Umbilical hernia 84/13/2440  . Sleeping difficulties 07/15/2015  . Bunion, right 06/20/2015  . Callus of foot 04/27/2015  . Muscle spasm 04/16/2015  . B12 deficiency 03/30/2015  . Hammer toe of left foot 02/16/2015  . Fibrosis of skin of lower extremity 02/16/2015  . Status post right foot surgery 10/26/2014  . Cervical spondylosis with radiculopathy 03/01/2014  .  Hammer toe of right foot 01/26/2014  . Benign intracranial hypertension 06/18/2013  . Disturbance of skin sensation 04/21/2013  . Cephalalgia 04/21/2013  . Porokeratosis 03/31/2013  . Vitamin D deficiency 04/18/2010  . Low back pain 11/29/2008  . MEDIAL MENISCUS TEAR, LEFT 06/30/2008  . BARRETTS ESOPHAGUS 06/28/2008  . OSTEOARTHRITIS, KNEES, BILATERAL 06/28/2008  . OBESITY 05/24/2008  . HYPERTENSION, BENIGN ESSENTIAL 05/24/2008  . Irritable bowel syndrome 05/24/2008  . BARIATRIC SURGERY STATUS 03/19/2006  . NEOPLASM, MALIGNANT, COLON, HX OF 03/19/1997    Current Outpatient Medications on File Prior to Visit  Medication Sig Dispense Refill  . diclofenac sodium (VOLTAREN) 1 % GEL Apply 2 g topically 4 (four) times daily. 100 g 5  . hydrochlorothiazide (MICROZIDE) 12.5 MG capsule TAKE 1 CAPSULE BY MOUTH ONCE DAILY AS NEEDED FOR  SWELLING  IN  LEGS  OR  ELEVATED  BLOOD  PRESSURE 90 capsule 0  . lisinopril (ZESTRIL) 20 MG tablet Take 1 tablet by mouth once daily 90 tablet 0  . mirabegron ER (MYRBETRIQ) 25 MG TB24 tablet Take 1 tablet (25 mg total) by mouth daily. 30 tablet 5  . NARCAN 4 MG/0.1ML LIQD nasal spray kit SPRAY 0.1 ML (4MG) IN 1 NOSTRIL BY INTRANASAL ROUTE. MAY REPEAT DOSE EVERY 2 TO 3 MINUTES AS NEEDED ALTERNATING NOSTRILS WITH EACH DOSE    . omeprazole (PRILOSEC) 40 MG capsule Take 1 capsule (40 mg total) by mouth daily. 30 capsule 5  . ondansetron (ZOFRAN) 4 MG tablet Take 1  tablet (4 mg total) by mouth every 6 (six) hours as needed for nausea. 20 tablet 3  . Oxycodone HCl 10 MG TABS Take 10 mg by mouth 5 (five) times daily.     Marland Kitchen rOPINIRole (REQUIP) 0.25 MG tablet Take 1 tab at bedtime x two nights then increase to 2 tabs at bedtime. 60 tablet 3  . Syringe/Needle, Disp, (SYRINGE 3CC/25GX1") 25G X 1" 3 ML MISC Use for monthly B12 injections 3 each 3  . triamcinolone cream (KENALOG) 0.5 % APPLY 1 APPLICATION TOPICALLY 3 TIMES DAILY 30 g 0  . VENTOLIN HFA 108 (90 Base) MCG/ACT  inhaler Inhale 1-2 puffs into the lungs every 4 (four) hours as needed for wheezing or shortness of breath. 6.7 g 5  . XTAMPZA ER 13.5 MG C12A      No current facility-administered medications on file prior to visit.    Past Medical History:  Diagnosis Date  . Allergy   . Arthritis   . Back pain   . Colon cancer ([place])   . Depression   . Diarrhea   . GERD (gastroesophageal reflux disease)   . Hypertension   . Insomnia   . MS (multiple sclerosis) ([place])   . Other hammer toe (acquired) 03/31/2013  . Pneumonia    hx of  . Umbilical hernia     Past Surgical History:  Procedure Laterality Date  . ABDOMINAL HYSTERECTOMY    . ANTERIOR CERVICAL DECOMP/DISCECTOMY FUSION N/A 03/01/2014   Procedure: ANTERIOR CERVICAL DECOMPRESSION/DISCECTOMY FUSION 1 LEVEL;  Surgeon: Charlie Pitter, MD;  Location: [facility];  Service: Neurosurgery;  Laterality: N/A;  ANTERIOR CERVICAL DECOMPRESSION/DISCECTOMY FUSION 1 LEVEL  . BUNIONECTOMY Right 10/14/2014   '@[facility]'$   . CESAREAN SECTION    . COLON SURGERY    . COLONOSCOPY    . FOOT SURGERY Right   . GASTRIC BYPASS    . Hammer Toe Repair Right 10/14/2014   RT #2, '@[facility]'$   . TENOTOMY Right 10/14/2014   RT #3, '@[facility]'$     Social History   Socioeconomic History  . Marital status: Single    Spouse name: Not on file  . Number of children: 2  . Years of education: Not on file  . Highest education level: Not on file  Occupational History  . Occupation: Disability  Tobacco Use  . Smoking status: Former Smoker    Packs/day: 0.25    Years: 20.00    Pack years: 5.00    Types: Cigarettes    Quit date: 09/22/2018    Years since quitting: 0.7  . Smokeless tobacco: Never Used  . Tobacco comment: states she quit 3 months ago  Substance and Sexual Activity  . Alcohol use: Yes    Alcohol/week: 0.0 standard drinks    Comment: 1/2 pint daily  . Drug use: No  . Sexual activity: Yes    Partners: Male  Other Topics Concern  . Not on file  Social History Narrative    . Not on file   Social Determinants of Health   Financial Resource Strain:   . Difficulty of Paying Living Expenses:   Food Insecurity:   . Worried About Charity fundraiser in the Last Year:   . Arboriculturist in the Last Year:   Transportation Needs:   . Film/video editor (Medical):   Marland Kitchen Lack of Transportation (Non-Medical):   Physical Activity:   . Days of Exercise per Week:   . Minutes of Exercise per Session:  Stress:   . Feeling of Stress :   Social Connections:   . Frequency of Communication with Friends and Family:   . Frequency of Social Gatherings with Friends and Family:   . Attends Religious Services:   . Active Member of Clubs or Organizations:   . Attends Archivist Meetings:   Marland Kitchen Marital Status:     Family History  Problem Relation Age of Onset  . Stroke Mother   . Hypertension Mother   . Hyperlipidemia Mother   . Diabetes Mother   . Diabetes Brother   . Hypertension Brother   . Hypertension Brother   . Hypertension Brother   . Hypertension Brother   . Hypertension Brother   . Alcoholism Brother     Review of Systems  Constitutional: Negative for chills and fever.  Skin: Positive for color change and wound.       Objective:   Vitals:   06/12/19 1508  BP: 118/64  Pulse: (!) 110  Resp: 16  Temp: 98 F (36.7 C)  SpO2: 97%   BP Readings from Last 3 Encounters:  06/12/19 118/64  11/25/18 110/64  04/16/18 114/66   Wt Readings from Last 3 Encounters:  06/12/19 256 lb (116.1 kg)  03/03/19 261 lb (118.4 kg)  11/27/18 268 lb (121.6 kg)   Body mass index is 43.94 kg/m.   Physical Exam Constitutional:      General: She is not in acute distress.    Appearance: Normal appearance. She is not ill-appearing.  HENT:     Head: Normocephalic and atraumatic.  Cardiovascular:     Comments: 1 + right DP and TP pulses Skin:    General: Skin is warm and dry.     Findings: Lesion (ulcer right lateral lower leg with slight escar in  middle, no discharge/bleeding.  surround erythema that is tender and warm) present.  Neurological:     Mental Status: She is alert.            Assessment & Plan:    See Problem List for Assessment and Plan of chronic medical problems.    This visit occurred during the SARS-CoV-2 public health emergency.  Safety protocols were in place, including screening questions prior to the visit, additional usage of staff PPE, and extensive cleaning of exam room while observing appropriate contact time as indicated for disinfecting solutions.

## 2019-06-12 ENCOUNTER — Encounter: Payer: Self-pay | Admitting: Internal Medicine

## 2019-06-12 ENCOUNTER — Ambulatory Visit (INDEPENDENT_AMBULATORY_CARE_PROVIDER_SITE_OTHER): Payer: Medicare Other | Admitting: Internal Medicine

## 2019-06-12 ENCOUNTER — Other Ambulatory Visit: Payer: Self-pay

## 2019-06-12 VITALS — BP 118/64 | HR 110 | Temp 98.0°F | Resp 16 | Ht 64.0 in | Wt 256.0 lb

## 2019-06-12 DIAGNOSIS — L97911 Non-pressure chronic ulcer of unspecified part of right lower leg limited to breakdown of skin: Secondary | ICD-10-CM

## 2019-06-12 DIAGNOSIS — M1611 Unilateral primary osteoarthritis, right hip: Secondary | ICD-10-CM | POA: Diagnosis not present

## 2019-06-12 DIAGNOSIS — E538 Deficiency of other specified B group vitamins: Secondary | ICD-10-CM | POA: Diagnosis not present

## 2019-06-12 DIAGNOSIS — L97909 Non-pressure chronic ulcer of unspecified part of unspecified lower leg with unspecified severity: Secondary | ICD-10-CM | POA: Insufficient documentation

## 2019-06-12 MED ORDER — DOCUSATE SODIUM 100 MG PO CAPS: 100.0000 mg | ORAL_CAPSULE | Freq: Two times a day (BID) | ORAL | 5 refills | Status: DC | PRN

## 2019-06-12 MED ORDER — MELOXICAM 15 MG PO TABS: ORAL_TABLET | ORAL | 1 refills | Status: DC

## 2019-06-12 MED ORDER — GABAPENTIN 300 MG PO CAPS: ORAL_CAPSULE | ORAL | 1 refills | Status: DC

## 2019-06-12 MED ORDER — CYANOCOBALAMIN 1000 MCG/ML IJ SOLN
1000.0000 ug | Freq: Once | INTRAMUSCULAR | Status: AC
  Administered 2019-06-12: 1000 ug via INTRAMUSCULAR

## 2019-06-12 MED ORDER — SILVER SULFADIAZINE 1 % EX CREA: TOPICAL_CREAM | CUTANEOUS | 0 refills | Status: DC

## 2019-06-12 MED ORDER — DOXYCYCLINE HYCLATE 100 MG PO TABS: 100.0000 mg | ORAL_TABLET | Freq: Two times a day (BID) | ORAL | 0 refills | Status: DC

## 2019-06-12 NOTE — Patient Instructions (Addendum)
Medications reviewed and updated.  Changes include :   Doxycycline twice daily for 10 days.  Apply the cream and keep the area moist.    Your prescription(s) have been submitted to your pharmacy. Please take as directed and contact our office if you believe you are having problem(s) with the medication(s).  You had a B12 injection today.     A referral was ordered for physical therapy at the aquatic center and the wound clinic.      Someone will call you to schedule this.

## 2019-06-12 NOTE — Assessment & Plan Note (Signed)
B12 injection today 

## 2019-06-13 NOTE — Assessment & Plan Note (Signed)
Chronic Needs to lose weight so she can have surgery Wants to do aquatic exercises/therapy referred

## 2019-06-13 NOTE — Assessment & Plan Note (Signed)
Acute Non-healing x 1 month after burning herself with a heating pad Appears to be infected Also has escar in ulcer and likely needs debridement Start doxycycline 100 mg bid x 10 days Silver sulfadiazine topically qd-bid - keep moist Referred to wound clinic

## 2019-06-16 ENCOUNTER — Telehealth: Payer: Self-pay | Admitting: Internal Medicine

## 2019-06-16 DIAGNOSIS — M1611 Unilateral primary osteoarthritis, right hip: Secondary | ICD-10-CM

## 2019-06-16 NOTE — Telephone Encounter (Signed)
Call patient and clarify which she wants-this is what she requested.

## 2019-06-16 NOTE — Telephone Encounter (Signed)
    [facility] calling, states they do not offer aquatic therapy

## 2019-06-17 DIAGNOSIS — Z79891 Long term (current) use of opiate analgesic: Secondary | ICD-10-CM | POA: Diagnosis not present

## 2019-06-17 DIAGNOSIS — G894 Chronic pain syndrome: Secondary | ICD-10-CM | POA: Diagnosis not present

## 2019-06-17 DIAGNOSIS — M545 Low back pain: Secondary | ICD-10-CM | POA: Diagnosis not present

## 2019-06-17 DIAGNOSIS — M25551 Pain in right hip: Secondary | ICD-10-CM | POA: Diagnosis not present

## 2019-06-22 DIAGNOSIS — M25551 Pain in right hip: Secondary | ICD-10-CM | POA: Diagnosis not present

## 2019-06-22 DIAGNOSIS — G8929 Other chronic pain: Secondary | ICD-10-CM | POA: Diagnosis not present

## 2019-06-23 NOTE — Telephone Encounter (Signed)
[facility] is where she said was offering the aquatic therapy. I guess a referral would go there.

## 2019-06-23 NOTE — Telephone Encounter (Signed)
New referral ordered

## 2019-06-24 ENCOUNTER — Encounter (HOSPITAL_BASED_OUTPATIENT_CLINIC_OR_DEPARTMENT_OTHER): Payer: Medicare Other | Admitting: Physician Assistant

## 2019-06-26 ENCOUNTER — Other Ambulatory Visit: Payer: Self-pay

## 2019-06-26 ENCOUNTER — Telehealth: Payer: Self-pay | Admitting: Internal Medicine

## 2019-06-26 ENCOUNTER — Encounter (HOSPITAL_BASED_OUTPATIENT_CLINIC_OR_DEPARTMENT_OTHER): Payer: Medicare Other | Attending: Physician Assistant | Admitting: Internal Medicine

## 2019-06-26 DIAGNOSIS — Z980 Intestinal bypass and anastomosis status: Secondary | ICD-10-CM | POA: Insufficient documentation

## 2019-06-26 DIAGNOSIS — T24331D Burn of third degree of right lower leg, subsequent encounter: Secondary | ICD-10-CM | POA: Insufficient documentation

## 2019-06-26 DIAGNOSIS — G2581 Restless legs syndrome: Secondary | ICD-10-CM | POA: Insufficient documentation

## 2019-06-26 DIAGNOSIS — L97818 Non-pressure chronic ulcer of other part of right lower leg with other specified severity: Secondary | ICD-10-CM | POA: Diagnosis not present

## 2019-06-26 DIAGNOSIS — Z981 Arthrodesis status: Secondary | ICD-10-CM | POA: Diagnosis not present

## 2019-06-26 DIAGNOSIS — Z85038 Personal history of other malignant neoplasm of large intestine: Secondary | ICD-10-CM | POA: Insufficient documentation

## 2019-06-26 DIAGNOSIS — W860XXD Exposure to domestic wiring and appliances, subsequent encounter: Secondary | ICD-10-CM | POA: Diagnosis not present

## 2019-06-26 DIAGNOSIS — E785 Hyperlipidemia, unspecified: Secondary | ICD-10-CM | POA: Insufficient documentation

## 2019-06-26 DIAGNOSIS — T24331A Burn of third degree of right lower leg, initial encounter: Secondary | ICD-10-CM | POA: Diagnosis not present

## 2019-06-26 DIAGNOSIS — M1611 Unilateral primary osteoarthritis, right hip: Secondary | ICD-10-CM | POA: Insufficient documentation

## 2019-06-26 DIAGNOSIS — G932 Benign intracranial hypertension: Secondary | ICD-10-CM

## 2019-06-26 NOTE — Progress Notes (Signed)
°  Chronic Care Management   Outreach Note  06/26/2019 Name: Misty [name] MRN: [id number] DOB: February 05, 1961  Referred by: [name], MD Reason for referral : No chief complaint on file.   An unsuccessful telephone outreach was attempted today. The patient was referred to the pharmacist for assistance with care management and care coordination.   Follow Up Plan:   [name] [employer] Scheduler

## 2019-06-26 NOTE — Progress Notes (Signed)
MERCED, TRENCHARD ([id number]) Visit Report for 06/26/2019 Abuse/Suicide Risk Screen Details Patient Name: Date of Service: Misty [name], Misty [name] 06/26/2019 9:00 AM Medical Record [id number] Patient Account Number: [id number] Date of Birth/Sex: Treating RN: 1960-04-15 (59 y.o. Elam Dutch Primary Care Lilliona Blakeney: Billey Gosling Other Clinician: Referring Karyn Brull: Treating Malin Cervini/Extender:Robson, Baird Lyons, Nadine Counts in Treatment: 0 Abuse/Suicide Risk Screen Items Answer ABUSE RISK SCREEN: Has anyone close to you tried to hurt or harm you recentlyo No Do you feel uncomfortable with anyone in your familyo No Has anyone forced you do things that you didnt want to doo No Electronic Signature(s) Signed: 06/26/2019 6:10:03 PM By: Baruch Gouty RN, BSN Entered By: Baruch Gouty on 06/26/2019 09:32:14 -------------------------------------------------------------------------------- Activities of Daily Living Details Patient Name: Date of Service: Misty [name], Misty [name] 06/26/2019 9:00 AM Medical Record [id number] Patient Account Number: [id number] Date of Birth/Sex: Treating RN: 07/05/1960 (59 y.o. Elam Dutch Primary Care Nealy Hickmon: Billey Gosling Other Clinician: Referring Nithin Demeo: Treating Marlene Pfluger/Extender:Robson, Baird Lyons, Nadine Counts in Treatment: 0 Activities of Daily Living Items Answer Activities of Daily Living (Please select one for each item) Drive Automobile Completely Able Take Medications Completely Able Use Telephone Completely Able Care for Appearance Completely Able Use Toilet Completely Able Bath / Shower Completely Able Dress Self Completely Able Feed Self Completely Able Walk Completely Able Get In / Out Bed Completely Able Housework Completely Able Prepare Meals Completely Able Handle Money Completely Able Shop for Self Completely Able Electronic Signature(s) Signed: 06/26/2019 6:10:03 PM By: Baruch Gouty RN, BSN Entered By:  Baruch Gouty on 06/26/2019 09:32:34 -------------------------------------------------------------------------------- Education Screening Details Patient Name: Date of Service: Misty [name] 06/26/2019 9:00 AM Medical Record [id number] Patient Account Number: [id number] Date of Birth/Sex: Treating RN: 1961-01-20 (59 y.o. Elam Dutch Primary Care Kelly Ranieri: Billey Gosling Other Clinician: Referring Andreka Stucki: Treating Ambriel Gorelick/Extender:Robson, Baird Lyons, Nadine Counts in Treatment: 0 Primary Learner Assessed: Patient Learning Preferences/Education Level/Primary Language Learning Preference: Explanation, Demonstration, Printed Material Highest Education Level: College or Above Preferred Language: English Cognitive Barrier Language Barrier: No Translator Needed: No Memory Deficit: No Emotional Barrier: No Cultural/Religious Beliefs Affecting Medical Care: No Physical Barrier Impaired Vision: Yes Glasses Impaired Hearing: No Decreased Hand dexterity: No Knowledge/Comprehension Knowledge Level: High Comprehension Level: High Ability to understand written High instructions: Ability to understand verbal High instructions: Motivation Anxiety Level: Calm Cooperation: Cooperative Education Importance: Acknowledges Need Interest in Health Problems: Asks Questions Perception: Coherent Willingness to Engage in Self- High Management Activities: Readiness to Engage in Self- High Management Activities: Electronic Signature(s) Signed: 06/26/2019 6:10:03 PM By: Baruch Gouty RN, BSN Entered By: Baruch Gouty on 06/26/2019 09:34:32 -------------------------------------------------------------------------------- Fall Risk Assessment Details Patient Name: Date of Service: Misty [name] 06/26/2019 9:00 AM Medical Record [id number] Patient Account Number: [id number] Date of Birth/Sex: Treating RN: 10/20/1960 (59 y.o. Elam Dutch Primary Care  Kenan Moodie: Billey Gosling Other Clinician: Referring Jozlynn Plaia: Treating Skylynne Schlechter/Extender:Robson, Baird Lyons, Nadine Counts in Treatment: 0 Fall Risk Assessment Items Have you had 2 or more falls in the last 12 monthso 0 Yes Have you had any fall that resulted in injury in the last 12 monthso 0 No FALLS RISK SCREEN History of falling - immediate or within 3 months 0 No Secondary diagnosis (Do you have 2 or more medical diagnoseso) 0 No Ambulatory aid None/bed rest/wheelchair/nurse 0 No Crutches/cane/walker 15 Yes Furniture 0 No Intravenous therapy Access/Saline/Heparin Lock 0 No Weak (short steps with or without shuffle, stooped but able to lift head 0 No while walking, may seek support from furniture)  Impaired (short steps with shuffle, may have difficulty arising from chair, 0 No head down, impaired balance) Mental Status Oriented to own ability 0 Yes Overestimates or forgets limitations 0 No Risk Level: Low Risk Score: 15 Electronic Signature(s) Signed: 06/26/2019 6:10:03 PM By: Baruch Gouty RN, BSN Entered By: Baruch Gouty on 06/26/2019 09:35:15 -------------------------------------------------------------------------------- Foot Assessment Details Patient Name: Date of Service: Misty [name] 06/26/2019 9:00 AM Medical Record [id number] Patient Account Number: [id number] Date of Birth/Sex: Treating RN: 1960-08-13 (59 y.o. Elam Dutch Primary Care Genie Mirabal: Billey Gosling Other Clinician: Referring Timoty Bourke: Treating Gardiner Espana/Extender:Robson, Baird Lyons, Nadine Counts in Treatment: 0 Foot Assessment Items Site Locations + = Sensation present, - = Sensation absent, C = Callus, U = Ulcer R = Redness, W = Warmth, M = Maceration, PU = Pre-ulcerative lesion F = Fissure, S = Swelling, D = Dryness Assessment Right: Left: Other Deformity: No No Prior Foot Ulcer: No No Prior Amputation: No No Charcot Joint: No No Ambulatory Status: Ambulatory With  Help Assistance Device: Cane Gait: Steady Electronic Signature(s) Signed: 06/26/2019 6:10:03 PM By: Baruch Gouty RN, BSN Entered By: Baruch Gouty on 06/26/2019 09:36:24 -------------------------------------------------------------------------------- Nutrition Risk Screening Details Patient Name: Date of Service: Misty [name] 06/26/2019 9:00 AM Medical Record [id number] Patient Account Number: [id number] Date of Birth/Sex: Treating RN: 08-16-1960 (59 y.o. Elam Dutch Primary Care Aritzel Krusemark: Billey Gosling Other Clinician: Referring Seretha Estabrooks: Treating Ernisha Sorn/Extender:Robson, Baird Lyons, Nadine Counts in Treatment: 0 Height (in): 66 Weight (lbs): 252 Body Mass Index (BMI): 40.7 Nutrition Risk Screening Items Score Screening NUTRITION RISK SCREEN: I have an illness or condition that made me change the kind and/or 0 No amount of food I eat I eat fewer than two meals per day 0 No I eat few fruits and vegetables, or milk products 0 No I have three or more drinks of beer, liquor or wine almost every day 0 No I have tooth or mouth problems that make it hard for me to eat 0 No I don't always have enough money to buy the food I need 0 No I eat alone most of the time 0 No I take three or more different prescribed or over-the-counter drugs a day 1 Yes 0 No Without wanting to, I have lost or gained 10 pounds in the last six months I am not always physically able to shop, cook and/or feed myself 0 No Nutrition Protocols Good Risk Protocol 0 No interventions needed Moderate Risk Protocol High Risk Proctocol Risk Level: Good Risk Score: 1 Electronic Signature(s) Signed: 06/26/2019 6:10:03 PM By: Baruch Gouty RN, BSN Entered By: Baruch Gouty on 06/26/2019 09:36:07

## 2019-06-26 NOTE — Chronic Care Management (AMB) (Signed)
  Chronic Care Management   Note  06/26/2019 Name: Misty [name] MRN: [id number] DOB: 1960/03/24  Misty [name] is a 59 y.o. year old female who is a primary care patient of Burns, Claudina Lick, MD. I reached out to [name] by phone today in response to a referral sent by [name]'s PCP, [name], MD.   [name] was given information about Chronic Care Management services today including:  1. CCM service includes personalized support from designated clinical staff supervised by her physician, including individualized plan of care and coordination with other care providers 2. 24/7 contact phone numbers for assistance for urgent and routine care needs. 3. Service will only be billed when office clinical staff spend 20 minutes or more in a month to coordinate care. 4. Only one practitioner may furnish and bill the service in a calendar month. 5. The patient may stop CCM services at any time (effective at the end of the month) by phone call to the office staff.   Patient agreed to services and verbal consent obtained.   Follow up plan:   [name] [employer] Scheduler

## 2019-06-29 NOTE — Progress Notes (Signed)
Misty [name], Misty [name] ([id number]) Visit Report for 06/26/2019 Allergy List Details Patient Name: Date of Service: Misty, [name] 06/26/2019 9:00 AM Medical Record [id number] Patient Account Number: [id number] Date of Birth/Sex: Treating RN: [birth year] (59 y.o. Elam Dutch Primary Care Nadiya Pieratt: Billey Gosling Other Clinician: Referring Liviana Mills: Treating Cathleen Yagi/Extender:Robson, Baird Lyons, Nadine Counts in Treatment: 0 Allergies Active Allergies Phenergan Reaction: anxiety promethazine Reaction: anxiety trazodone Reaction: rash nefazodone Reaction: rash Allergy Notes Electronic Signature(s) Signed: 06/26/2019 6:10:03 PM By: Baruch Gouty RN, BSN Entered By: Baruch Gouty on 06/26/2019 09:59:16 -------------------------------------------------------------------------------- Arrival Information Details Patient Name: Date of Service: Misty Mantis A. 06/26/2019 9:00 AM Medical Record [id number] Patient Account Number: [id number] Date of Birth/Sex: Treating RN: 1961/01/27 (59 y.o. Elam Dutch Primary Care Marissa Weaver: Billey Gosling Other Clinician: Referring Anahi Belmar: Treating Savien Mamula/Extender:Robson, Baird Lyons, Nadine Counts in Treatment: 0 Visit Information Patient Arrived: Cane Arrival Time: 09:03 Accompanied By: self Transfer Assistance: None Patient Identification Verified: Yes Secondary Verification Process Completed: Yes Patient Requires Transmission-Based No Precautions: Patient Has Alerts: No Electronic Signature(s) Signed: 06/26/2019 6:10:03 PM By: Baruch Gouty RN, BSN Entered By: Baruch Gouty on 06/26/2019 09:05:49 -------------------------------------------------------------------------------- Clinic Level of Care Assessment Details Patient Name: Date of Service: BRIANIA, Misty [name] 06/26/2019 9:00 AM Medical Record [id number] Patient Account Number: [id number] Date of Birth/Sex: Treating RN: [birth year] (59 y.o. Clearnce Sorrel Primary Care Levie Wages: Billey Gosling Other Clinician: Referring Isabeau Mccalla: Treating Ayaka Andes/Extender:Robson, Baird Lyons, Nadine Counts in Treatment: 0 Clinic Level of Care Assessment Items TOOL 1 Quantity Score X - Use when EandM and Procedure is performed on INITIAL visit 1 0 ASSESSMENTS - Nursing Assessment / Reassessment X - General Physical Exam (combine w/ comprehensive assessment (listed just below) 1 20 when performed on new pt. evals) X - Comprehensive Assessment (HX, ROS, Risk Assessments, Wounds Hx, etc.) 1 25 ASSESSMENTS - Wound and Skin Assessment / Reassessment []  - Dermatologic / Skin Assessment (not related to wound area) 0 ASSESSMENTS - Ostomy and/or Continence Assessment and Care []  - Incontinence Assessment and Management 0 []  - Ostomy Care Assessment and Management (repouching, etc.) 0 PROCESS - Coordination of Care X - Simple Patient / Family Education for ongoing care 1 15 []  - Complex (extensive) Patient / Family Education for ongoing care 0 X - Staff obtains Consents, Records, Test Results / Process Orders 1 10 []  - Staff telephones HHA, Nursing Homes / Clarify orders / etc 0 []  - Routine Transfer to another Facility (non-emergent condition) 0 []  - Routine Hospital Admission (non-emergent condition) 0 X - New Admissions / Biomedical engineer / Ordering NPWT, Apligraf, etc. 1 15 []  - Emergency Hospital Admission (emergent condition) 0 PROCESS - Special Needs []  - Pediatric / Minor Patient Management 0 []  - Isolation Patient Management 0 []  - Hearing / Language / Visual special needs 0 []  - Assessment of Community assistance (transportation, D/C planning, etc.) 0 []  - Additional assistance / Altered mentation 0 []  - Support Surface(s) Assessment (bed, cushion, seat, etc.) 0 INTERVENTIONS - Miscellaneous []  - External ear exam 0 []  - Patient Transfer (multiple staff / Civil Service fast streamer / Similar devices) 0 []  - Simple Staple / Suture removal  (25 or less) 0 []  - Complex Staple / Suture removal (26 or more) 0 []  - Hypo/Hyperglycemic Management (do not check if billed separately) 0 []  - Ankle / Brachial Index (ABI) - do not check if billed separately 0 Has the patient been seen at the hospital within the last three years: Yes Total Score: 85 Level Of Care:  New/Established - Level 3 Electronic Signature(s) Signed: 06/26/2019 6:04:52 PM By: Kela Millin Entered By: Kela Millin on 06/26/2019 10:12:54 -------------------------------------------------------------------------------- Encounter Discharge Information Details Patient Name: Date of Service: Misty Mantis A. 06/26/2019 9:00 AM Medical Record [id number] Patient Account Number: [id number] Date of Birth/Sex: Treating RN: 10/17/1960 (59 y.o. Debby Bud Primary Care Michiel Sivley: Billey Gosling Other Clinician: Referring Yoshiharu Brassell: Treating Kayhan Boardley/Extender:Robson, Baird Lyons, Nadine Counts in Treatment: 0 Encounter Discharge Information Items Discharge Condition: Stable Ambulatory Status: Cane Discharge Destination: Home Transportation: Private Auto Accompanied By: self Schedule Follow-up Appointment: Yes Clinical Summary of Care: Electronic Signature(s) Signed: 06/26/2019 5:55:58 PM By: Deon Pilling Entered By: Deon Pilling on 06/26/2019 10:28:40 -------------------------------------------------------------------------------- Lower Extremity Assessment Details Patient Name: Date of Service: Misty, [name] 06/26/2019 9:00 AM Medical Record [id number] Patient Account Number: [id number] Date of Birth/Sex: Treating RN: Aug 24, 1960 (59 y.o. Elam Dutch Primary Care Lynia Landry: Billey Gosling Other Clinician: Referring Mckinzie Saksa: Treating Koriana Stepien/Extender:Robson, Baird Lyons, Nadine Counts in Treatment: 0 Edema Assessment Assessed: [Left: No] [Right: No] Edema: [Left: N] [Right: o] Calf Left: Right: Point of Measurement: cm From Medial  Instep cm 30.2 cm Ankle Left: Right: Point of Measurement: cm From Medial Instep cm 20.3 cm Vascular Assessment Pulses: Dorsalis Pedis Palpable: [Right:Yes] Electronic Signature(s) Signed: 06/26/2019 6:10:03 PM By: Baruch Gouty RN, BSN Entered By: Baruch Gouty on 06/26/2019 09:37:15 -------------------------------------------------------------------------------- Multi Wound Chart Details Patient Name: Date of Service: Misty Mantis A. 06/26/2019 9:00 AM Medical Record [id number] Patient Account Number: [id number] Date of Birth/Sex: Treating RN: 1960-10-15 (59 y.o. Clearnce Sorrel Primary Care Syaire Saber: Billey Gosling Other Clinician: Referring Graciano Batson: Treating Kiowa Hollar/Extender:Robson, Baird Lyons, Nadine Counts in Treatment: 0 Vital Signs Height(in): 66 Pulse(bpm): 78 Weight(lbs): 252 Blood Pressure(mmHg): 123/70 Body Mass Index(BMI): 41 Temperature(F): 98.4 Respiratory 18 Rate(breaths/min): Photos: [1:No Photos] [N/A:N/A] Wound Location: [1:Right, Lateral Lower Leg] [N/A:N/A] Wounding Event: [1:Thermal Burn] [N/A:N/A] Primary Etiology: [1:3rd degree Burn] [N/A:N/A] Comorbid History: [1:Anemia, Hypertension, Osteoarthritis, Received Chemotherapy] [N/A:N/A] Date Acquired: [1:05/11/2019] [N/A:N/A] Weeks of Treatment: [1:0] [N/A:N/A] Wound Status: [1:Open] [N/A:N/A] Measurements L x W x D 2.2x2.2x0.1 [N/A:N/A] (cm) Area (cm) : [1:3.801] [N/A:N/A] Volume (cm) : [1:0.38] [N/A:N/A] Classification: [1:Full Thickness Without Exposed Support Structures] [N/A:N/A] Exudate Amount: [1:Medium] [N/A:N/A] Exudate Type: [1:Serous] [N/A:N/A] Exudate Color: [1:amber] [N/A:N/A] Wound Margin: [1:Flat and Intact] [N/A:N/A] Granulation Amount: [1:Small (1-33%)] [N/A:N/A] Granulation Quality: [1:Pink] [N/A:N/A] Necrotic Amount: [1:Large (67-100%)] [N/A:N/A] Exposed Structures: [1:Fat Layer (Subcutaneous N/A Tissue) Exposed: Yes Fascia: No Tendon: No Muscle: No  Joint: No Bone: No] Epithelialization: [1:Small (1-33%)] [N/A:N/A] Procedures Performed: Dressings and/or [1:debridement of burns; small] [N/A:N/A] Treatment Notes Electronic Signature(s) Signed: 06/26/2019 6:04:52 PM By: Kela Millin Signed: 06/29/2019 9:07:11 AM By: Linton Ham MD Entered By: Linton Ham on 06/26/2019 10:18:47 -------------------------------------------------------------------------------- Multi-Disciplinary Care Plan Details Patient Name: Date of Service: Misty Mantis A. 06/26/2019 9:00 AM Medical Record [id number] Patient Account Number: [id number] Date of Birth/Sex: Treating RN: 1960/06/30 (59 y.o. Clearnce Sorrel Primary Care Furkan Keenum: Billey Gosling Other Clinician: Referring Janelle Culton: Treating Pebbles Zeiders/Extender:Robson, Baird Lyons, Nadine Counts in Treatment: 0 Active Inactive Orientation to the Wound Care Program Nursing Diagnoses: Knowledge deficit related to the wound healing center program Goals: Patient/caregiver will verbalize understanding of the [place] Date Initiated: 06/26/2019 Target Resolution Date: 07/31/2019 Goal Status: Active Interventions: Provide education on orientation to the wound center Notes: Wound/Skin Impairment Nursing Diagnoses: Impaired tissue integrity Goals: Ulcer/skin breakdown will have a volume reduction of 50% by week 8 Date Initiated: 06/26/2019 Target Resolution Date: 08/14/2019 Goal Status: Active Interventions: Provide education on  ulcer and skin care Notes: Electronic Signature(s) Signed: 06/26/2019 6:04:52 PM By: Kela Millin Entered By: Kela Millin on 06/26/2019 10:08:06 -------------------------------------------------------------------------------- Pain Assessment Details Patient Name: Date of Service: JAHNIA, HOLLIDAY 06/26/2019 9:00 AM Medical Record [id number] Patient Account Number: [id number] Date of Birth/Sex: Treating RN: 12/29/1960 (59  y.o. Elam Dutch Primary Care Murle Hellstrom: Billey Gosling Other Clinician: Referring Keyuana Wank: Treating Kaedance Magos/Extender:Robson, Baird Lyons, Nadine Counts in Treatment: 0 Active Problems Location of Pain Severity and Description of Pain Patient Has Paino Yes Site Locations Pain Location: Generalized Pain, Pain in Ulcers With Dressing Change: Yes Duration of the Pain. Constant / Intermittento Intermittent Rate the pain. Current Pain Level: 0 Worst Pain Level: 4 Character of Pain Describe the Pain: Tender Pain Management and Medication Current Pain Management: Other: tolerable Is the Current Pain Management Adequate: Adequate How does your wound impact your activities of daily livingo Sleep: No Bathing: No Appetite: No Relationship With Others: No Bladder Continence: No Emotions: No Bowel Continence: No Work: No Toileting: No Drive: No Dressing: No Hobbies: No Notes reports chronic back pain Electronic Signature(s) Signed: 06/26/2019 6:10:03 PM By: Baruch Gouty RN, BSN Entered By: Baruch Gouty on 06/26/2019 09:41:13 -------------------------------------------------------------------------------- Patient/Caregiver Education Details Patient Name: [name] Date of Service: 4/9/2021andnbsp9:00 AM Medical Record [phone] Patient Account Number: [id number] Date of Birth/Gender: 08/15/1960 (59 y.o. F) Treating RN: Kela Millin Primary Care Physician: Billey Gosling Other Clinician: Referring Physician: Treating Physician/Extender:Robson, Baird Lyons, Nadine Counts in Treatment: 0 Education Assessment Education Provided To: Patient Education Topics Provided Welcome To [facility]: Methods: Explain/Verbal Responses: State content correctly Wound/Skin Impairment: Methods: Explain/Verbal Responses: State content correctly Electronic Signature(s) Signed: 06/26/2019 6:04:52 PM By: Kela Millin Entered By: Kela Millin on  06/26/2019 10:08:18 -------------------------------------------------------------------------------- Wound Assessment Details Patient Name: Date of Service: [name] 06/26/2019 9:00 AM Medical Record [id number] Patient Account Number: [id number] Date of Birth/Sex: Treating RN: [birth year] (59 y.o. Elam Dutch Primary Care Emmett Bracknell: Billey Gosling Other Clinician: Referring Oaklyn Jakubek: Treating Tatem Holsonback/Extender:Robson, Baird Lyons, Nadine Counts in Treatment: 0 Wound Status Wound Number: 1 Primary 3rd degree Burn Etiology: Wound Location: Right, Lateral Lower Leg Wound Open Wounding Event: Thermal Burn Status: Date Acquired: 05/11/2019 Comorbid Anemia, Hypertension, Osteoarthritis, Weeks Of Treatment: 0 History: Received Chemotherapy Clustered Wound: No Wound Measurements Length: (cm) 2.2 % Reduct Width: (cm) 2.2 % Reduct Depth: (cm) 0.1 Epitheli Area: (cm) 3.801 Tunneli Volume: (cm) 0.38 Undermi Wound Description Full Thickness Without Exposed Support Foul Od Classification: Structures Slough/ Wound Flat and Intact Margin: Exudate Medium Amount: Exudate Serous Type: Exudate amber Color: Wound Bed Granulation Amount: Small (1-33%) Granulation Quality: Pink Fascia Necrotic Amount: Large (67-100%) Fat La Necrotic Quality: Adherent Slough Tendon Muscle Joint Bone E or After Cleansing: No Fibrino Yes Exposed Structure Exposed: No yer (Subcutaneous Tissue) Exposed: Yes Exposed: No Exposed: No Exposed: No xposed: No ion in Area: ion in Volume: alization: Small (1-33%) ng: No ning: No Treatment Notes Wound #1 (Right, Lateral Lower Leg) 1. Cleanse With Wound Cleanser 2. Periwound Care Skin Prep 3. Primary Dressing Applied Santyl Other primary dressing (specifiy in notes) 4. Secondary Dressing Foam Border Dressing 5. Secured With Self Adhesive Bandage Notes explained the orders, dressings, frequency of change, and when to  return to wound center. patient in agreement. Electronic Signature(s) Signed: 06/26/2019 6:10:03 PM By: Baruch Gouty RN, BSN Entered By: Baruch Gouty on 06/26/2019 09:38:45 -------------------------------------------------------------------------------- [place] Details Patient Name: Date of Service: [name] 06/26/2019 9:00 AM Medical Record [id number] Patient Account Number: [id number]  Date of Birth/Sex: Treating RN: 02/28/1961 (59 y.o. Elam Dutch Primary Care Dimitra Woodstock: Billey Gosling Other Clinician: Referring Richanda Darin: Treating Nandika Stetzer/Extender:Robson, Baird Lyons, Nadine Counts in Treatment: 0 Vital Signs Time Taken: 09:05 Temperature (F): 98.4 Height (in): 66 Pulse (bpm): 78 Source: Stated Respiratory Rate (breaths/min): 18 Weight (lbs): 252 Blood Pressure (mmHg): 123/70 Source: Stated Reference Range: 80 - 120 mg / dl Body Mass Index (BMI): 40.7 Electronic Signature(s) Signed: 06/26/2019 6:10:03 PM By: Baruch Gouty RN, BSN Entered By: Baruch Gouty on 06/26/2019 09:09:30
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
## 2019-06-30 DIAGNOSIS — M25551 Pain in right hip: Secondary | ICD-10-CM | POA: Diagnosis not present

## 2019-06-30 DIAGNOSIS — R262 Difficulty in walking, not elsewhere classified: Secondary | ICD-10-CM | POA: Diagnosis not present

## 2019-06-30 DIAGNOSIS — R2681 Unsteadiness on feet: Secondary | ICD-10-CM | POA: Diagnosis not present

## 2019-06-30 DIAGNOSIS — M545 Low back pain: Secondary | ICD-10-CM | POA: Diagnosis not present

## 2019-07-03 ENCOUNTER — Encounter (HOSPITAL_BASED_OUTPATIENT_CLINIC_OR_DEPARTMENT_OTHER): Payer: Medicare Other | Admitting: Internal Medicine

## 2019-07-03 ENCOUNTER — Other Ambulatory Visit: Payer: Self-pay

## 2019-07-03 DIAGNOSIS — M1611 Unilateral primary osteoarthritis, right hip: Secondary | ICD-10-CM | POA: Diagnosis not present

## 2019-07-03 DIAGNOSIS — Z981 Arthrodesis status: Secondary | ICD-10-CM | POA: Diagnosis not present

## 2019-07-03 DIAGNOSIS — T24331D Burn of third degree of right lower leg, subsequent encounter: Secondary | ICD-10-CM | POA: Diagnosis not present

## 2019-07-03 DIAGNOSIS — Z980 Intestinal bypass and anastomosis status: Secondary | ICD-10-CM | POA: Diagnosis not present

## 2019-07-03 DIAGNOSIS — Z85038 Personal history of other malignant neoplasm of large intestine: Secondary | ICD-10-CM | POA: Diagnosis not present

## 2019-07-03 DIAGNOSIS — E785 Hyperlipidemia, unspecified: Secondary | ICD-10-CM | POA: Diagnosis not present

## 2019-07-03 DIAGNOSIS — G2581 Restless legs syndrome: Secondary | ICD-10-CM | POA: Diagnosis not present

## 2019-07-03 DIAGNOSIS — L97818 Non-pressure chronic ulcer of other part of right lower leg with other specified severity: Secondary | ICD-10-CM | POA: Diagnosis not present

## 2019-07-03 NOTE — Progress Notes (Signed)
SHEKENA, BAUDO ([id number]) Visit Report for 07/03/2019 Arrival Information Details Patient Name: Date of Service: AQUINNAH, VUNCANNON 07/03/2019 12:30 PM Medical Record [id number] Patient Account Number: [id number] Date of Birth/Sex: Treating RN: [birth year] (58 y.o. [name] Primary Care Izabela Ow: Billey Gosling Other Clinician: Referring Marianne Golightly: Treating Iasha Mccalister/Extender:Robson, Baird Lyons, Nadine Counts in Treatment: 1 Visit Information History Since Last Visit Cane Added or deleted any medications: No Patient Arrived: 12:38 Any new allergies or adverse reactions: No Arrival Time: alone Had a fall or experienced change in No Accompanied By: None activities of daily living that may affect Transfer Assistance: risk of falls: Patient Identification Verified: Yes Signs or symptoms of abuse/neglect since last No Secondary Verification Process Completed: Yes visito Patient Requires Transmission-Based No Hospitalized since last visit: No Precautions: Implantable device outside of the clinic excluding No Patient Has Alerts: No cellular tissue based products placed in the center since last visit: Has Dressing in Place as Prescribed: No Pain Present Now: No Electronic Signature(s) Signed: 07/03/2019 4:22:27 PM By: Levan Hurst RN, BSN Entered By: Levan Hurst on 07/03/2019 12:38:52 -------------------------------------------------------------------------------- Encounter Discharge Information Details Patient Name: Date of Service: [name] 07/03/2019 12:30 PM Medical Record [id number] Patient Account Number: [id number] Date of Birth/Sex: Treating RN: 06/08/1960 (59 y.o. [name] Primary Care Audelia Knape: Billey Gosling Other Clinician: Referring Jerrilyn Messinger: Treating Pernella Ackerley/Extender:Robson, Baird Lyons, Nadine Counts in Treatment: 1 Encounter Discharge Information Items Post Procedure Vitals Discharge Condition: Stable Temperature  (F): 98.3 Ambulatory Status: Cane Pulse (bpm): 75 Discharge Destination: Home Respiratory Rate (breaths/min): 18 Transportation: Private Auto Blood Pressure (mmHg): 94/37 Accompanied By: alone Schedule Follow-up Appointment: Yes Clinical Summary of Care: Patient Declined Electronic Signature(s) Signed: 07/03/2019 4:22:27 PM By: Levan Hurst RN, BSN Entered By: Levan Hurst on 07/03/2019 12:41:36 -------------------------------------------------------------------------------- Wound Assessment Details Patient Name: Date of Service: [name] 07/03/2019 12:30 PM Medical Record [id number] Patient Account Number: [id number] Date of Birth/Sex: Treating RN: 05-26-1960 (59 y.o. [name] Primary Care Kenyon Eshleman: Billey Gosling Other Clinician: Referring Reygan Heagle: Treating Jannine Abreu/Extender:Robson, Baird Lyons, Nadine Counts in Treatment: 1 Wound Status Wound Number: 1 Primary 3rd degree Burn Etiology: Wound Location: Right, Lateral Lower Leg Wound Open Wounding Event: Thermal Burn Status: Date Acquired: 05/11/2019 Comorbid Anemia, Hypertension, Osteoarthritis, Weeks Of Treatment: 1 History: Received Chemotherapy Clustered Wound: No Wound Measurements Length: (cm) 2.2 % Reduct Width: (cm) 2.2 % Reduct Depth: (cm) 0.1 Epitheli Area: (cm) 3.801 Tunneli Volume: (cm) 0.38 Undermi Wound Description Classification: Full Thickness Without Exposed Support Foul Od Structures Slough/ Wound Flat and Intact Margin: Exudate Medium Amount: Exudate Serosanguineous Type: Exudate red, brown Color: Wound Bed Granulation Amount: Small (1-33%) Granulation Quality: Red Fascia Necrotic Amount: Large (67-100%) Fat Lay Necrotic Quality: Adherent Slough Tendon Muscle Joint E Bone Exposed or After Cleansing: No Fibrino Yes Exposed Structure Exposed: No er (Subcutaneous Tissue) Exposed: Yes Exposed: No Exposed: No xposed: No : No ion in Area: 0% ion in  Volume: 0% alization: Small (1-33%) ng: No ning: No Treatment Notes Wound #1 (Right, Lateral Lower Leg) 1. Cleanse With Wound Cleanser 3. Primary Dressing Applied Santyl 4. Secondary Dressing Foam Border Dressing Electronic Signature(s) Signed: 07/03/2019 4:22:27 PM By: Levan Hurst RN, BSN Entered By: Levan Hurst on 07/03/2019 12:39:22 -------------------------------------------------------------------------------- [place] Details Patient Name: Date of Service: [name] 07/03/2019 12:30 PM Medical Record [id number] Patient Account Number: [id number] Date of Birth/Sex: Treating RN: [birth year] (59 y.o. [name] Primary Care Nikoli Nasser: Billey Gosling Other Clinician: Referring Reeve Turnley: Treating Varian Innes/Extender:Robson, Baird Lyons, Nadine Counts  in Treatment: 1 Vital Signs Time Taken: 12:38 Temperature (F): 98.3 Height (in): 66 Pulse (bpm): 75 Weight (lbs): 252 Respiratory Rate (breaths/min): 18 Body Mass Index (BMI): 40.7 Blood Pressure (mmHg): 94/37 Reference Range: 80 - 120 mg / dl Electronic Signature(s) Signed: 07/03/2019 4:22:27 PM By: Levan Hurst RN, BSN Entered By: Levan Hurst on 07/03/2019 12:39:05

## 2019-07-04 NOTE — Progress Notes (Signed)
[name], [name] ([id number]) Visit Report for 07/03/2019 Debridement Details Patient Name: Date of Service: Misty [name], Misty [name] 07/03/2019 12:30 PM Medical Record [id number] Patient Account Number: [id number] Date of Birth/Sex: Treating RN: [birth year] (59 y.o. [name] Primary Care Provider: Billey Gosling Other Clinician: Referring Provider: Treating Provider/Extender:Danira Nylander, Baird Lyons, Nadine Counts in Treatment: 1 Debridement Performed for Wound #1 Right,Lateral Lower Leg Assessment: Performed By: Clinician Levan Hurst, RN Debridement Type: Chemical/Enzymatic/Mechanical Agent Used: Santyl Level of Consciousness (Pre- Awake and Alert procedure): Pre-procedure Verification/Time Out Taken: Yes - 12:40 Start Time: 12:40 Bleeding: None End Time: 12:40 Procedural Pain: 0 Post Procedural Pain: 0 Response to Treatment: Procedure was tolerated well Level of Consciousness Awake and Alert (Post-procedure): Post Debridement Measurements of Total Wound Length: (cm) 2.2 Width: (cm) 2.2 Depth: (cm) 0.1 Volume: (cm) 0.38 Character of Wound/Ulcer Post Requires Further Debridement Debridement: Electronic Signature(s) Signed: 07/03/2019 4:22:27 PM By: Levan Hurst RN, BSN Signed: 07/04/2019 6:55:41 AM By: Linton Ham MD Entered By: Levan Hurst on 07/03/2019 12:40:09 -------------------------------------------------------------------------------- [place] Details Patient Name: Date of Service: [name] 07/03/2019 Medical Record [id number] Patient Account Number: [id number] Date of Birth/Sex: Treating RN: [birth year] (59 y.o. 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

## 2019-07-06 ENCOUNTER — Other Ambulatory Visit: Payer: Self-pay

## 2019-07-06 ENCOUNTER — Encounter (HOSPITAL_BASED_OUTPATIENT_CLINIC_OR_DEPARTMENT_OTHER): Payer: Medicare Other | Admitting: Internal Medicine

## 2019-07-06 DIAGNOSIS — M545 Low back pain: Secondary | ICD-10-CM | POA: Diagnosis not present

## 2019-07-06 DIAGNOSIS — Z981 Arthrodesis status: Secondary | ICD-10-CM | POA: Diagnosis not present

## 2019-07-06 DIAGNOSIS — T24331D Burn of third degree of right lower leg, subsequent encounter: Secondary | ICD-10-CM | POA: Diagnosis not present

## 2019-07-06 DIAGNOSIS — L97818 Non-pressure chronic ulcer of other part of right lower leg with other specified severity: Secondary | ICD-10-CM | POA: Diagnosis not present

## 2019-07-06 DIAGNOSIS — Z980 Intestinal bypass and anastomosis status: Secondary | ICD-10-CM | POA: Diagnosis not present

## 2019-07-06 DIAGNOSIS — M1611 Unilateral primary osteoarthritis, right hip: Secondary | ICD-10-CM | POA: Diagnosis not present

## 2019-07-06 DIAGNOSIS — T24331A Burn of third degree of right lower leg, initial encounter: Secondary | ICD-10-CM | POA: Diagnosis not present

## 2019-07-06 DIAGNOSIS — Z85038 Personal history of other malignant neoplasm of large intestine: Secondary | ICD-10-CM | POA: Diagnosis not present

## 2019-07-06 DIAGNOSIS — G2581 Restless legs syndrome: Secondary | ICD-10-CM | POA: Diagnosis not present

## 2019-07-06 DIAGNOSIS — E785 Hyperlipidemia, unspecified: Secondary | ICD-10-CM | POA: Diagnosis not present

## 2019-07-06 NOTE — Progress Notes (Signed)
Misty [name], Misty [name] ([id number]) Visit Report for 07/06/2019 HPI Details Patient Name: Date of Service: Misty [name], Misty [name] 07/06/2019 7:30 AM Medical Record [id number] Patient Account Number: [id number] Date of Birth/Sex: Treating RN: 1960/08/05 (59 y.o. [name] Primary Care Provider: Billey Gosling Other Clinician: Referring Provider: Treating Provider/Extender:Esraa Seres, Baird Lyons, Nadine Counts in Treatment: 1 History of Present Illness HPI Description: ADMISSION 06/26/2019 This is a 59 year old woman who has chronic osteoarthritis of her right hip and lower back. She has had a surgical lumbar spinal fusion in the past. She was sleeping with a heating pad on her right hip and right back. She set the timer and woke up somewhat later with the heating pad down at her right lateral lower leg with a burn. She was seen by her primary physician recently and given a course of doxycycline which she finished 2 days ago that helped with the discomfort. She has been using Silvadene cream on the area. She is not a diabetic. Past medical history includes osteoarthritis of the right hip, restless leg syndrome, B12 deficiency, chronic low back pain status post surgical fusion in the lumbar area, ventral hernia, history of colon cancer status post chemotherapy and surgery hyperlipidemia, pancreatitis, bariatric surgery and Barrett's esophagus 4/19; wound looks marginally better. She is using Santyl daily third-degree burn Electronic Signature(s) Signed: 07/06/2019 5:34:04 PM By: Linton Ham MD Entered By: Linton Ham on 07/06/2019 08:25:19 -------------------------------------------------------------------------------- Dressings and/or debridement of burns; small Details Patient Name: Date of Service: Misty [name], Misty [name] 07/06/2019 7:30 AM Medical Record [id number] Patient Account Number: [id number] Date of Birth/Sex: Treating RN: [birth year] (59 y.o. [name] Primary Care Provider: Billey Gosling Other Clinician: Referring Provider: Treating Provider/Extender:Kyriakos Babler, Baird Lyons, Nadine Counts in Treatment: 1 Procedure Performed for: Wound #1 Right,Lateral Lower Leg Performed By: Physician Ricard Dillon., MD Post Procedure Diagnosis Same as Pre-procedure Electronic Signature(s) Signed: 07/06/2019 5:34:04 PM By: Linton Ham MD Signed: 07/06/2019 6:02:31 PM By: Levan Hurst RN, BSN Entered By: Levan Hurst on 07/06/2019 08:28:26 -------------------------------------------------------------------------------- Physical Exam Details Patient Name: Date of Service: Misty [name], Misty [name] 07/06/2019 7:30 AM Medical Record [id number] Patient Account Number: [id number] Date of Birth/Sex: Treating RN: 06/12/1960 (59 y.o. [name] Primary Care Provider: Billey Gosling Other Clinician: Referring Provider: Treating Provider/Extender:Kilani Joffe, Baird Lyons, Nadine Counts in Treatment: 1 Constitutional Sitting or standing Blood Pressure is within target range for patient.. Pulse regular and within target range for patient.Marland Kitchen Respirations regular, non-labored and within target range.. Temperature is normal and within the target range for the patient.Marland Kitchen Appears in no distress. Notes Wound exam; the area in question is on the right lateral lower leg just about at the level of the fibular head. Surface looks somewhat better than the first time we saw her on 4/9. Using a #5 curette I have reattempted debridement. I am able to get some of the very adherent necrotic material off however she tolerates this exceptionally poorly. There is no evidence of surrounding infection Electronic Signature(s) Signed: 07/06/2019 5:34:04 PM By: Linton Ham MD Entered By: Linton Ham on 07/06/2019 08:26:34 -------------------------------------------------------------------------------- Physician Orders Details Patient Name: Date of  Service: Misty [name] 07/06/2019 7:30 AM Medical Record [id number] Patient Account Number: [id number] Date of Birth/Sex: Treating RN: 1960/04/01 (59 y.o. [name] Primary Care Provider: Billey Gosling Other Clinician: Referring Provider: Treating Provider/Extender:Maryhelen Lindler, Baird Lyons, Nadine Counts in Treatment: 1 Verbal / Phone Orders: No Diagnosis Coding ICD-10 Coding Code Description [id number] Burn of third degree of right lower leg, subsequent encounter L97.818 Non-pressure  chronic ulcer of other part of right lower leg with other specified severity Follow-up Appointments Return Appointment in 1 week. Dressing Change Frequency Wound #1 Right,Lateral Lower Leg Change dressing every day. Wound Cleansing Wound #1 Right,Lateral Lower Leg May shower and wash wound with soap and water. - with dressing change Primary Wound Dressing Wound #1 Right,Lateral Lower Leg Santyl Ointment Secondary Dressing Wound #1 Right,Lateral Lower Leg Foam Border - or large bandaid Other: - lightly moistened gauze with saline Electronic Signature(s) Signed: 07/06/2019 5:34:04 PM By: Linton Ham MD Signed: 07/06/2019 6:02:31 PM By: Levan Hurst RN, BSN Entered By: Levan Hurst on 07/06/2019 08:23:41 -------------------------------------------------------------------------------- Problem List Details Patient Name: Date of Service: Misty [name] 07/06/2019 7:30 AM Medical Record [id number] Patient Account Number: [id number] Date of Birth/Sex: Treating RN: Feb 12, 1961 (59 y.o. [name] Primary Care Provider: Billey Gosling Other Clinician: Referring Provider: Treating Provider/Extender:Claudell Rhody, Baird Lyons, Nadine Counts in Treatment: 1 Active Problems ICD-10 Evaluated Encounter Code Description Active Date Today Diagnosis T24.331D Burn of third degree of right lower leg, subsequent 06/26/2019 No Yes encounter L97.818 Non-pressure chronic ulcer of other  part of right lower 06/26/2019 No Yes leg with other specified severity Inactive Problems Resolved Problems Electronic Signature(s) Signed: 07/06/2019 5:34:04 PM By: Linton Ham MD Entered By: Linton Ham on 07/06/2019 08:24:16 -------------------------------------------------------------------------------- Progress Note Details Patient Name: Date of Service: Misty [name] 07/06/2019 7:30 AM Medical Record [id number] Patient Account Number: [id number] Date of Birth/Sex: Treating RN: 10-21-1960 (59 y.o. [name] Primary Care Provider: Billey Gosling Other Clinician: Referring Provider: Treating Provider/Extender:Kadrian Partch, Baird Lyons, Nadine Counts in Treatment: 1 Subjective History of Present Illness (HPI) ADMISSION 06/26/2019 This is a 59 year old woman who has chronic osteoarthritis of her right hip and lower back. She has had a surgical lumbar spinal fusion in the past. She was sleeping with a heating pad on her right hip and right back. She set the timer and woke up somewhat later with the heating pad down at her right lateral lower leg with a burn. She was seen by her primary physician recently and given a course of doxycycline which she finished 2 days ago that helped with the discomfort. She has been using Silvadene cream on the area. She is not a diabetic. Past medical history includes osteoarthritis of the right hip, restless leg syndrome, B12 deficiency, chronic low back pain status post surgical fusion in the lumbar area, ventral hernia, history of colon cancer status post chemotherapy and surgery hyperlipidemia, pancreatitis, bariatric surgery and Barrett's esophagus 4/19; wound looks marginally better. She is using Santyl daily third-degree burn Objective Constitutional Sitting or standing Blood Pressure is within target range for patient.. Pulse regular and within target range for patient.Marland Kitchen Respirations regular, non-labored and within target range..  Temperature is normal and within the target range for the patient.Marland Kitchen Appears in no distress. Vitals Time Taken: 7:51 AM, Height: 66 in, Weight: 252 lbs, BMI: 40.7, Temperature: 98.4 F, Pulse: 88 bpm, Respiratory Rate: 18 breaths/min, Blood Pressure: 126/70 mmHg. General Notes: Wound exam; the area in question is on the right lateral lower leg just about at the level of the fibular head. Surface looks somewhat better than the first time we saw her on 4/9. Using a #5 curette I have reattempted debridement. I am able to get some of the very adherent necrotic material off however she tolerates this exceptionally poorly. There is no evidence of surrounding infection Integumentary (Hair, Skin) Wound #1 status is Open. Original cause of wound was Thermal Burn. The  wound is located on the Right,Lateral Lower Leg. The wound measures 1.7cm length x 1.5cm width x 0.3cm depth; 2.003cm^2 area and 0.601cm^3 volume. There is Fat Layer (Subcutaneous Tissue) Exposed exposed. There is no tunneling or undermining noted. There is a medium amount of serosanguineous drainage noted. The wound margin is flat and intact. There is small (1-33%) red granulation within the wound bed. There is a large (67-100%) amount of necrotic tissue within the wound bed including Adherent Slough. 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 An Dressings and/or debridement of burns; small procedure was performed by Ricard Dillon., MD. Post procedure Diagnosis Wound #1: Same as Pre-Procedure Plan Follow-up Appointments: Return Appointment in 1 week. Dressing Change Frequency: Wound #1 Right,Lateral Lower Leg: Change dressing every day. Wound Cleansing: Wound #1  Right,Lateral Lower Leg: May shower and wash wound with soap and water. - with dressing change Primary Wound Dressing: Wound #1 Right,Lateral Lower Leg: Santyl Ointment Secondary Dressing: Wound #1 Right,Lateral Lower Leg: Foam Border - or large bandaid Other: - lightly moistened gauze with saline #1 continue Santyl change daily. She can wash this with soap and water. 2. Unfortunately this is going to take probably at least 2 or 3 more weeks of attempted debridement both enzymatically and mechanically. She does not tolerate this well Electronic Signature(s) Signed: 07/06/2019 5:34:04 PM By: Linton Ham MD Signed: 07/06/2019 6:02:31 PM By: Levan Hurst RN, BSN Entered By: Levan Hurst on 07/06/2019 08:28:36 -------------------------------------------------------------------------------- [place] Details Patient Name: Date of Service: Misty [name] 07/06/2019 Medical Record [id number] Patient Account Number: [id number] Date of Birth/Sex: Treating RN: 1960-04-19 (59 y.o. [name] Primary Care Provider: Billey Gosling Other Clinician: Referring Provider: Treating Provider/Extender:Elianna Windom, Baird Lyons, Nadine Counts in Treatment: 1 Diagnosis Coding ICD-10 Codes Code Description [id number] Burn of third degree of right lower leg, subsequent encounter L97.818 Non-pressure chronic ulcer of other part of right lower leg with other specified severity Facility Procedures CPT4 Code: [id number] Description: 16020 - BURN DRSG W/O ANESTH-SM ICD-10 Diagnosis Description T24.331D Burn of third degree of right lower leg, subsequent en Modifier: counter Quantity: 1 Physician Procedures CPT4 Code Description: [id number] [place] DRESS/DEBRID SM,<5% TOT BODY SURF ICD-10 Diagnosis Description [id number] Burn of third degree of right lower leg, subsequent encount Modifier: er Quantity: 1 Electronic Signature(s) Signed: 07/06/2019 5:34:04 PM By: Linton Ham MD Entered  By: Linton Ham on 07/06/2019 08:28:07

## 2019-07-07 NOTE — Progress Notes (Signed)
RAVINDER, FURLOUGH ([id number]) Visit Report for 07/06/2019 Arrival Information Details Patient Name: Date of Service: Misty [name], Misty [name] 07/06/2019 7:30 AM Medical Record [id number] Patient Account Number: [id number] Date of Birth/Sex: Treating RN: 28-Nov-1960 (59 y.o. Orvan Falconer Primary Care Srinivas Lippman: Billey Gosling Other Clinician: Referring Derek Huneycutt: Treating Zaya Kessenich/Extender:Robson, Baird Lyons, Nadine Counts in Treatment: 1 Visit Information History Since Last Visit All ordered tests and consults were completed: No Patient Arrived: Ambulatory Added or deleted any medications: No Arrival Time: 07:50 Any new allergies or adverse reactions: No Accompanied By: self Had a fall or experienced change in No Transfer Assistance: None activities of daily living that may affect Patient Identification Verified: Yes risk of falls: Secondary Verification Process Completed: Yes Signs or symptoms of abuse/neglect since last No Patient Requires Transmission-Based No visito Precautions: Hospitalized since last visit: No Patient Has Alerts: No Implantable device outside of the clinic excluding No cellular tissue based products placed in the center since last visit: Has Dressing in Place as Prescribed: Yes Pain Present Now: No Electronic Signature(s) Signed: 07/07/2019 5:39:56 PM By: Carlene Coria RN Entered By: Carlene Coria on 07/06/2019 07:50:57 -------------------------------------------------------------------------------- Encounter Discharge Information Details Patient Name: Date of Service: Misty Mantis A. 07/06/2019 7:30 AM Medical Record [id number] Patient Account Number: [id number] Date of Birth/Sex: Treating RN: 01-28-1961 (59 y.o. Clearnce Sorrel Primary Care Kirstine Jacquin: Billey Gosling Other Clinician: Referring Marcelino Campos: Treating Jazz Rogala/Extender:Robson, Baird Lyons, Nadine Counts in Treatment: 1 Encounter Discharge Information Items Discharge  Condition: Stable Ambulatory Status: Cane Discharge Destination: Home Transportation: Private Auto Accompanied By: Serena Croissant Schedule Follow-up Appointment: Yes Clinical Summary of Care: Patient Declined Electronic Signature(s) Signed: 07/06/2019 5:36:36 PM By: Kela Millin Entered By: Kela Millin on 07/06/2019 08:28:50 -------------------------------------------------------------------------------- Lower Extremity Assessment Details Patient Name: Date of Service: Misty [name] 07/06/2019 7:30 AM Medical Record [id number] Patient Account Number: [id number] Date of Birth/Sex: Treating RN: April 06, 1960 (59 y.o. Orvan Falconer Primary Care Kerah Hardebeck: Billey Gosling Other Clinician: Referring Satomi Buda: Treating Alexandre Faries/Extender:Robson, Baird Lyons, Nadine Counts in Treatment: 1 Edema Assessment Assessed: [Left: No] [Right: No] Edema: [Left: N] [Right: o] Calf Left: Right: Point of Measurement: cm From Medial Instep cm 39 cm Ankle Left: Right: Point of Measurement: cm From Medial Instep cm 21 cm Electronic Signature(s) Signed: 07/07/2019 5:39:56 PM By: Carlene Coria RN Entered By: Carlene Coria on 07/06/2019 07:54:46 -------------------------------------------------------------------------------- Multi Wound Chart Details Patient Name: Date of Service: Misty Mantis A. 07/06/2019 7:30 AM Medical Record [id number] Patient Account Number: [id number] Date of Birth/Sex: Treating RN: [birth year] (59 y.o. [name] Primary Care [place] Huberty: Billey Gosling Other Clinician: Referring Ludy Messamore: Treating Ramari Bray/Extender:Robson, Baird Lyons, Nadine Counts in Treatment: 1 Vital Signs Height(in): 66 Pulse(bpm): 88 Weight(lbs): 252 Blood Pressure(mmHg): 126/70 Body Mass Index(BMI): 41 Temperature(F): 98.4 Respiratory 18 Rate(breaths/min): Photos: [1:No Photos] [N/A:N/A] Wound Location: [1:Right, Lateral Lower Leg] [N/A:N/A] Wounding Event: [1:Thermal Burn]  [N/A:N/A] Primary Etiology: [1:3rd degree Burn] [N/A:N/A] Comorbid History: [1:Anemia, Hypertension, Osteoarthritis, Received Chemotherapy] [N/A:N/A] Date Acquired: [1:05/11/2019] [N/A:N/A] Weeks of Treatment: [1:1] [N/A:N/A] Wound Status: [1:Open] [N/A:N/A] Measurements L x W x D 1.7x1.5x0.3 [N/A:N/A] (cm) Area (cm) : [1:2.003] [N/A:N/A] Volume (cm) : [1:0.601] [N/A:N/A] % Reduction in Area: [1:47.30%] [N/A:N/A] % Reduction in Volume: -58.20% [N/A:N/A] Classification: [1:Full Thickness Without Exposed Support Structures] [N/A:N/A] Exudate Amount: [1:Medium] [N/A:N/A] Exudate Type: [1:Serosanguineous] [N/A:N/A] Exudate Color: [1:red, brown] [N/A:N/A] Wound Margin: [1:Flat and Intact] [N/A:N/A] Granulation Amount: [1:Small (1-33%)] [N/A:N/A] Granulation Quality: [1:Red] [N/A:N/A] Necrotic Amount: [1:Large (67-100%)] [N/A:N/A] Exposed Structures: [1:Fat Layer (Subcutaneous N/A Tissue) Exposed: Yes Fascia: No Tendon: No  Muscle: No Joint: No Bone: No] Epithelialization: [1:Small (1-33%)] [N/A:N/A] Debridement: [1:Debridement - Excisional N/A] Pre-procedure [1:08:22] [N/A:N/A] Verification/Time Out Taken: Pain Control: [1:Other] [N/A:N/A] Tissue Debrided: [1:Subcutaneous, Slough] [N/A:N/A] Level: [1:Skin/Subcutaneous Tissue N/A] Debridement Area (sq cm):2.55 [N/A:N/A] Instrument: [1:Curette] [N/A:N/A] Bleeding: [1:Minimum] [N/A:N/A] Hemostasis Achieved: [1:Pressure] [N/A:N/A] Procedural Pain: [1:4] [N/A:N/A] Post Procedural Pain: [1:2] [N/A:N/A] Debridement Treatment Procedure was tolerated [N/A:N/A] Response: [1:well] Post Debridement [1:1.7x1.5x0.3] [N/A:N/A] Measurements L x W x D (cm) Post Debridement [1:0.601] [N/A:N/A] Volume: (cm) Procedures Performed: [1:Debridement] [N/A:N/A] Treatment Notes Electronic Signature(s) Signed: 07/06/2019 5:34:04 PM By: Linton Ham MD Signed: 07/06/2019 6:02:31 PM By: Levan Hurst RN, BSN Entered By: Linton Ham on 07/06/2019  08:24:38 -------------------------------------------------------------------------------- Multi-Disciplinary Care Plan Details Patient Name: Date of Service: Misty Mantis A. 07/06/2019 7:30 AM Medical Record [id number] Patient Account Number: [id number] Date of Birth/Sex: Treating RN: 1961-03-12 (59 y.o. [name] Primary Care Kasara Schomer: Billey Gosling Other Clinician: Referring Dynasia Kercheval: Treating Anaijah Augsburger/Extender:Robson, Baird Lyons, Nadine Counts in Treatment: 1 Active Inactive Orientation to the Wound Care Program Nursing Diagnoses: Knowledge deficit related to the wound healing center program Goals: Patient/caregiver will verbalize understanding of the [place] Date Initiated: 06/26/2019 Target Resolution Date: 07/31/2019 Goal Status: Active Interventions: Provide education on orientation to the wound center Notes: Wound/Skin Impairment Nursing Diagnoses: Impaired tissue integrity Goals: Ulcer/skin breakdown will have a volume reduction of 50% by week 8 Date Initiated: 06/26/2019 Target Resolution Date: 08/14/2019 Goal Status: Active Interventions: Provide education on ulcer and skin care Notes: Electronic Signature(s) Signed: 07/06/2019 6:02:31 PM By: Levan Hurst RN, BSN Entered By: Levan Hurst on 07/06/2019 07:55:14 -------------------------------------------------------------------------------- Pain Assessment Details Patient Name: Date of Service: [name] 07/06/2019 7:30 AM Medical Record [id number] Patient Account Number: [id number] Date of Birth/Sex: Treating RN: October 30, 1960 (59 y.o. Orvan Falconer Primary Care Braidan Ricciardi: Billey Gosling Other Clinician: Referring Noriel Guthrie: Treating Pegi Milazzo/Extender:Robson, Baird Lyons, Nadine Counts in Treatment: 1 Active Problems Location of Pain Severity and Description of Pain Patient Has Paino No Site Locations Pain Management and Medication Current Pain  Management: Electronic Signature(s) Signed: 07/07/2019 5:39:56 PM By: Carlene Coria RN Entered By: Carlene Coria on 07/06/2019 07:51:35 -------------------------------------------------------------------------------- Patient/Caregiver Education Details Patient Name: [name] Date of Service: 4/19/2021andnbsp7:30 AM Medical Record [id number] Patient Account Number: [id number] Date of Birth/Gender: 1961/02/09 (59 y.o. F) Treating RN: Levan Hurst Primary Care Physician: Billey Gosling Other Clinician: Referring Physician: Treating Physician/Extender:Robson, Baird Lyons, Nadine Counts in Treatment: 1 Education Assessment Education Provided To: Patient Education Topics Provided Wound/Skin Impairment: Methods: Explain/Verbal Responses: State content correctly Electronic Signature(s) Signed: 07/06/2019 6:02:31 PM By: Levan Hurst RN, BSN Entered By: Levan Hurst on 07/06/2019 07:55:29 -------------------------------------------------------------------------------- Wound Assessment Details Patient Name: Date of Service: [name] 07/06/2019 7:30 AM Medical Record [id number] Patient Account Number: [id number] Date of Birth/Sex: Treating RN: 04/05/1960 (59 y.o. Orvan Falconer Primary Care Truly Stankiewicz: Billey Gosling Other Clinician: Referring Zayvion Stailey: Treating Kamber Vignola/Extender:Robson, Baird Lyons, Nadine Counts in Treatment: 1 Wound Status Wound Number: 1 Primary 3rd degree Burn Etiology: Wound Location: Right, Lateral Lower Leg Wound Open Wounding Event: Thermal Burn Status: Date Acquired: 05/11/2019 Comorbid Anemia, Hypertension, Osteoarthritis, Weeks Of Treatment: 1 History: Received Chemotherapy Clustered Wound: No Wound Measurements Length: (cm) 1.7 % Reduct Width: (cm) 1.5 % Reduct Depth: (cm) 0.3 Epitheli Area: (cm) 2.003 Tunneli Volume: (cm) 0.601 Undermi Wound Description Full Thickness Without Exposed Support Foul  Od Classification: Structures Slough/ Wound Flat and Intact Margin: Exudate Medium Amount: Exudate Serosanguineous Type: Exudate red, brown Color: Wound Bed Granulation Amount: Small (  1-33%) Granulation Quality: Red Fascia Expo Necrotic Amount: Large (67-100%) Fat Layer ( Necrotic Quality: Adherent Slough Tendon Expo Muscle Expo Joint Expos Bone Expose or After Cleansing: No Fibrino Yes Exposed Structure sed: No Subcutaneous Tissue) Exposed: Yes sed: No sed: No ed: No d: No ion in Area: 47.3% ion in Volume: -58.2% alization: Small (1-33%) ng: No ning: No Treatment Notes Wound #1 (Right, Lateral Lower Leg) 1. Cleanse With Wound Cleanser 2. Periwound Care Skin Prep 3. Primary Dressing Applied Santyl 4. Secondary Dressing Foam Border Dressing Electronic Signature(s) Signed: 07/07/2019 5:39:56 PM By: Carlene Coria RN Entered By: Carlene Coria on 07/06/2019 07:55:25 -------------------------------------------------------------------------------- [place] Details Patient Name: Date of Service: [name] 07/06/2019 7:30 AM Medical Record [id number] Patient Account Number: [id number] Date of Birth/Sex: Treating RN: 1960-11-01 (59 y.o. Orvan Falconer Primary Care Kirsti Mcalpine: Billey Gosling Other Clinician: Referring Asra Gambrel: Treating Gwenyth Dingee/Extender:Robson, Baird Lyons, Nadine Counts in Treatment: 1 Vital Signs Time Taken: 07:51 Temperature (F): 98.4 Height (in): 66 Pulse (bpm): 88 Weight (lbs): 252 Respiratory Rate (breaths/min): 18 Body Mass Index (BMI): 40.7 Blood Pressure (mmHg): 126/70 Reference Range: 80 - 120 mg / dl Electronic Signature(s) Signed: 07/07/2019 5:39:56 PM By: Carlene Coria RN Entered By: Carlene Coria on 07/06/2019 07:51:30

## 2019-07-08 ENCOUNTER — Ambulatory Visit (INDEPENDENT_AMBULATORY_CARE_PROVIDER_SITE_OTHER): Payer: Medicare Other | Admitting: Podiatry

## 2019-07-08 ENCOUNTER — Encounter: Payer: Self-pay | Admitting: Podiatry

## 2019-07-08 ENCOUNTER — Telehealth: Payer: Self-pay

## 2019-07-08 ENCOUNTER — Other Ambulatory Visit: Payer: Self-pay

## 2019-07-08 VITALS — Temp 96.8°F

## 2019-07-08 DIAGNOSIS — M545 Low back pain, unspecified: Secondary | ICD-10-CM

## 2019-07-08 DIAGNOSIS — G8929 Other chronic pain: Secondary | ICD-10-CM

## 2019-07-08 DIAGNOSIS — Q828 Other specified congenital malformations of skin: Secondary | ICD-10-CM

## 2019-07-08 DIAGNOSIS — M2041 Other hammer toe(s) (acquired), right foot: Secondary | ICD-10-CM

## 2019-07-08 DIAGNOSIS — M2042 Other hammer toe(s) (acquired), left foot: Secondary | ICD-10-CM

## 2019-07-08 NOTE — Progress Notes (Signed)
This patient presents the office with chief complaint of painful calluses on the bottom of her right foot.  She says these calluses are painful walking and wearing her shoes.  She says she has previously had surgery to correct her painful calluses but that the surgery was unsuccessful.  She says I referred her to [facility] for Liliane Channel to evaluate her foot and recommend. Orthoses.  Patient says that due to her insurance not covering the orthoses she was unable to acquire the orthoses at that time.  She presents now with painful calluses on the bottom of her right foot which need treatment.  She also says she is interested in acquiring a new set of orthoses.  She says she has orthoses at home but she has had these for years.  She presents the office today for an evaluation and treatment of her painful calluses right foot.  General Appearance  Alert, conversant and in no acute stress.  Vascular  Dorsalis pedis and posterior tibial  pulses are palpable  bilaterally.  Capillary return is within normal limits  bilaterally. Temperature is within normal limits  bilaterally.  Neurologic  Senn-Weinstein monofilament wire test within normal limits  bilaterally. Muscle power within normal limits bilaterally.  Nails Thick disfigured discolored nails with subungual debris  from hallux to fifth toes bilaterally. No evidence of bacterial infection or drainage bilaterally.  Orthopedic  No limitations of motion  feet .  No crepitus or effusions noted.  No bony pathology or digital deformities noted. 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  Skin  normotropic skin with no porokeratosis noted bilaterally.  No signs of infections or ulcers noted.  Porokeratosis sub 3,5  Right foot.  Porokeratosis right forefoot.  Debride porokeratosis using # 15 blade.   RTC prn for callus treatment.  Patient was told to bring her old orthoses to [facility] for an adjustment and she can talk to him about new orthoses.Gardiner Barefoot DPM

## 2019-07-08 NOTE — Telephone Encounter (Signed)
New message   The patient calling  The patient does not feels comfortable with the wound care center MD, asking for another referral to another specialist, or does she needs to come in the office to see Dr. Quay Burow.  The patient is asking why is a notation on her chart stating she is a DM

## 2019-07-09 NOTE — Addendum Note (Signed)
Addended by: [name] on: 07/09/2019 04:23 PM   Modules accepted: Orders

## 2019-07-09 NOTE — Telephone Encounter (Signed)
Pt needs a referral to pain management due to her clinic closing. I made pt aware that she is not listed as a diabetic in her chart. She will try the wound clinic she went to one more time on Wednesday. She had a bad experience but they called her and asked her to try them one more time.

## 2019-07-13 ENCOUNTER — Encounter (HOSPITAL_BASED_OUTPATIENT_CLINIC_OR_DEPARTMENT_OTHER): Payer: Medicare Other | Admitting: Internal Medicine

## 2019-07-13 DIAGNOSIS — R262 Difficulty in walking, not elsewhere classified: Secondary | ICD-10-CM | POA: Diagnosis not present

## 2019-07-13 DIAGNOSIS — M25551 Pain in right hip: Secondary | ICD-10-CM | POA: Diagnosis not present

## 2019-07-13 DIAGNOSIS — R2681 Unsteadiness on feet: Secondary | ICD-10-CM | POA: Diagnosis not present

## 2019-07-13 DIAGNOSIS — M545 Low back pain: Secondary | ICD-10-CM | POA: Diagnosis not present

## 2019-07-15 ENCOUNTER — Encounter (HOSPITAL_BASED_OUTPATIENT_CLINIC_OR_DEPARTMENT_OTHER): Payer: Medicare Other | Admitting: Physician Assistant

## 2019-07-15 DIAGNOSIS — Z79891 Long term (current) use of opiate analgesic: Secondary | ICD-10-CM | POA: Diagnosis not present

## 2019-07-15 DIAGNOSIS — G894 Chronic pain syndrome: Secondary | ICD-10-CM | POA: Diagnosis not present

## 2019-07-15 DIAGNOSIS — M545 Low back pain: Secondary | ICD-10-CM | POA: Diagnosis not present

## 2019-07-15 DIAGNOSIS — M25551 Pain in right hip: Secondary | ICD-10-CM | POA: Diagnosis not present

## 2019-07-16 ENCOUNTER — Other Ambulatory Visit: Payer: Medicare Other | Admitting: Orthotics

## 2019-07-17 ENCOUNTER — Encounter (HOSPITAL_BASED_OUTPATIENT_CLINIC_OR_DEPARTMENT_OTHER): Payer: Medicare Other | Admitting: Internal Medicine

## 2019-07-17 DIAGNOSIS — G8929 Other chronic pain: Secondary | ICD-10-CM | POA: Diagnosis not present

## 2019-07-17 DIAGNOSIS — M5416 Radiculopathy, lumbar region: Secondary | ICD-10-CM | POA: Diagnosis not present

## 2019-07-17 DIAGNOSIS — Z889 Allergy status to unspecified drugs, medicaments and biological substances status: Secondary | ICD-10-CM | POA: Diagnosis not present

## 2019-07-17 DIAGNOSIS — L253 Unspecified contact dermatitis due to other chemical products: Secondary | ICD-10-CM | POA: Diagnosis not present
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
## 2019-07-20 ENCOUNTER — Other Ambulatory Visit: Payer: Self-pay

## 2019-07-20 ENCOUNTER — Ambulatory Visit: Payer: Medicare Other | Admitting: Pharmacist

## 2019-07-20 DIAGNOSIS — G8929 Other chronic pain: Secondary | ICD-10-CM

## 2019-07-20 DIAGNOSIS — M545 Low back pain, unspecified: Secondary | ICD-10-CM

## 2019-07-20 DIAGNOSIS — I10 Essential (primary) hypertension: Secondary | ICD-10-CM

## 2019-07-20 DIAGNOSIS — E782 Mixed hyperlipidemia: Secondary | ICD-10-CM

## 2019-07-20 NOTE — Patient Instructions (Addendum)
Visit Information  Thank you for meeting with me to discuss your medications! I look forward to working with you to achieve your health care goals. Below is a summary of what we talked about during the visit:  Goals Addressed            This Visit's Progress   . Pharmacy Care Plan       CARE PLAN ENTRY  Current Barriers:  . Chronic Disease Management support, education, and care coordination needs related to Hypertension, Hyperlipidemia, and Chronic Pain   Hypertension . Pharmacist Clinical Goal(s): o Over the next 180 days, patient will work with PharmD and providers to maintain BP goal < 130/80 . Current regimen:  o Lisinopril 20 mg daily o HCTZ 12.5 mg daily as needed for swelling or BP . Interventions: o Discussed blood pressure goals and importance of maintaining BP in optimal range 100/60 to 130/80 . Patient self care activities - Over the next 180 days, patient will: o Check BP daily, document, and provide at future appointments o Ensure daily salt intake < 2300 mg/day  Hyperlipidemia Lipid Panel     Component Value Date/Time   CHOL 248 (H) 11/25/2018 0951   TRIG 161.0 (H) 11/25/2018 0951   HDL 57.10 11/25/2018 0951   LDLCALC 158 (H) 11/25/2018 0951 .  Pharmacist Clinical Goal(s): o Over the next 180 days, patient will work with PharmD and providers to achieve LDL goal < 100 . Current regimen:  o No medications . Interventions: o Discussed benefits of weight loss and diet to control many conditions including cholesterol . Patient self care activities - Over the next 180 days, patient will: o Continue to exercise 3-5 days weekly, both weights and cardio o Limit high-cholesterol foods like fried foods, red meat, and eggs  Chronic Pain . Pharmacist Clinical Goal(s) o Over the next 180 days, patient will work with PharmD and providers to improve sleep and mood . Current regimen:  o Oxycodone 10 mg 5 times daily - rarely takes all 5 o Xtampza (oxycodone) ER 13.5  mg BID o Gabapentin 300 mg - 1 AM, 2 PM and 2 HS o Diclofenac 1% gel: 2-3 times daily  o Meloxicam 15 mg daily . Interventions: o Discussed importance of following up with pain managements and adhering to physical therapy recommendations o Discussed importance of medication organization to prevent overdose o Discussed benefits of weight loss on joint pain . Patient self care activities - Over the next 180 days, patient will: o Use a pill box to organize oxycodone  o Keep Narcan on hand in case of accidental overdose o Follow with pain management and adhere to recommendations o Continue exercising 3-5 days per week to lose weight for hip surgery  Medication management . Pharmacist Clinical Goal(s): o Over the next 180 days, patient will work with PharmD and providers to achieve optimal medication adherence . Current pharmacy: [facility] . Interventions o Comprehensive medication review performed. o Continue current medication management strategy . Patient self care activities - Over the next 90 days, patient will: o Focus on medication adherence by pill box o Take medications as prescribed o Report any questions or concerns to PharmD and/or provider(s)  Initial goal documentation        Misty [name] was given information about Chronic Care Management services today including:  1. CCM service includes personalized support from designated clinical staff supervised by her physician, including individualized plan of care and coordination with other care providers 2. 24/7 contact phone  numbers for assistance for urgent and routine care needs. 3. Standard insurance, coinsurance, copays and deductibles apply for chronic care management only during months in which we provide at least 20 minutes of these services. Most insurances cover these services at 100%, however patients may be responsible for any copay, coinsurance and/or deductible if applicable. This service may help you avoid the need  for more expensive face-to-face services. 4. Only one practitioner may furnish and bill the service in a calendar month. 5. The patient may stop CCM services at any time (effective at the end of the month) by phone call to the office staff.  Patient agreed to services and verbal consent obtained.   The patient verbalized understanding of instructions provided today and agreed to receive a mailed copy of patient instruction and/or educational materials. Telephone follow up appointment with pharmacy team member scheduled for: 6 months  Charlene Brooke, PharmD Clinical Pharmacist [facility] at [facility] [phone]    Cholesterol Content in Foods Cholesterol is a waxy, fat-like substance that helps to carry fat in the blood. The body needs cholesterol in small amounts, but too much cholesterol can cause damage to the arteries and heart. Most people should eat less than 200 milligrams (mg) of cholesterol a day. Foods with cholesterol  Cholesterol is found in animal-based foods, such as meat, seafood, and dairy. Generally, low-fat dairy and lean meats have less cholesterol than full-fat dairy and fatty meats. The milligrams of cholesterol per serving (mg per serving) of common cholesterol-containing foods are listed below. Meat and other proteins  Egg -- one large whole egg has 186 mg.  Veal shank -- 4 oz has 141 mg.  Lean ground [place] (93% lean) -- 4 oz has 118 mg.  Fat-trimmed lamb loin -- 4 oz has 106 mg.  Lean ground beef (90% lean) -- 4 oz has 100 mg.  Lobster -- 3.5 oz has 90 mg.  Pork loin chops -- 4 oz has 86 mg.  Canned salmon -- 3.5 oz has 83 mg.  Fat-trimmed beef top loin -- 4 oz has 78 mg.  Frankfurter -- 1 frank (3.5 oz) has 77 mg.  Crab -- 3.5 oz has 71 mg.  Roasted chicken without skin, white meat -- 4 oz has 66 mg.  Light bologna -- 2 oz has 45 mg.  Deli-cut [place] -- 2 oz has 31 mg.  Canned tuna -- 3.5 oz has 31 mg.  Berniece Salines -- 1 oz has  29 mg.  Oysters and mussels (raw) -- 3.5 oz has 25 mg.  Mackerel -- 1 oz has 22 mg.  Trout -- 1 oz has 20 mg.  Pork sausage -- 1 link (1 oz) has 17 mg.  Salmon -- 1 oz has 16 mg.  Tilapia -- 1 oz has 14 mg. Dairy  Soft-serve ice cream --  cup (4 oz) has 103 mg.  Whole-milk yogurt -- 1 cup (8 oz) has 29 mg.  Cheddar cheese -- 1 oz has 28 mg.  American cheese -- 1 oz has 28 mg.  Whole milk -- 1 cup (8 oz) has 23 mg.  2% milk -- 1 cup (8 oz) has 18 mg.  Cream cheese -- 1 tablespoon (Tbsp) has 15 mg.  Cottage cheese --  cup (4 oz) has 14 mg.  Low-fat (1%) milk -- 1 cup (8 oz) has 10 mg.  Sour cream -- 1 Tbsp has 8.5 mg.  Low-fat yogurt -- 1 cup (8 oz) has 8 mg.  Nonfat [place]  yogurt -- 1 cup (8 oz) has 7 mg.  Half-and-half cream -- 1 Tbsp has 5 mg. Fats and oils  Cod liver oil -- 1 tablespoon (Tbsp) has 82 mg.  Butter -- 1 Tbsp has 15 mg.  Lard -- 1 Tbsp has 14 mg.  Bacon grease -- 1 Tbsp has 14 mg.  Mayonnaise -- 1 Tbsp has 5-10 mg.  Margarine -- 1 Tbsp has 3-10 mg. Exact amounts of cholesterol in these foods may vary depending on specific ingredients and brands. Foods without cholesterol Most plant-based foods do not have cholesterol unless you combine them with a food that has cholesterol. Foods without cholesterol include:  Grains and cereals.  Vegetables.  Fruits.  Vegetable oils, such as olive, canola, and sunflower oil.  Legumes, such as peas, beans, and lentils.  Nuts and seeds.  Egg whites. Summary  The body needs cholesterol in small amounts, but too much cholesterol can cause damage to the arteries and heart.  Most people should eat less than 200 milligrams (mg) of cholesterol a day. This information is not intended to replace advice given to you by your health care provider. Make sure you discuss any questions you have with your health care provider. Document Revised: 02/15/2017 Document Reviewed: 10/30/2016 Elsevier Patient  Education  [place].

## 2019-07-20 NOTE — Addendum Note (Signed)
Addended by: Aviva Signs M on: 07/20/2019 01:11 PM   Modules accepted: Orders

## 2019-07-22 DIAGNOSIS — M25551 Pain in right hip: Secondary | ICD-10-CM | POA: Diagnosis not present

## 2019-07-22 DIAGNOSIS — Z79899 Other long term (current) drug therapy: Secondary | ICD-10-CM | POA: Diagnosis not present

## 2019-07-22 DIAGNOSIS — M47817 Spondylosis without myelopathy or radiculopathy, lumbosacral region: Secondary | ICD-10-CM | POA: Diagnosis not present

## 2019-07-22 DIAGNOSIS — Z79891 Long term (current) use of opiate analgesic: Secondary | ICD-10-CM | POA: Diagnosis not present

## 2019-07-22 DIAGNOSIS — G8929 Other chronic pain: Secondary | ICD-10-CM | POA: Diagnosis not present

## 2019-07-22 DIAGNOSIS — M4600 Spinal enthesopathy, site unspecified: Secondary | ICD-10-CM | POA: Diagnosis not present

## 2019-07-22 DIAGNOSIS — M5136 Other intervertebral disc degeneration, lumbar region: Secondary | ICD-10-CM | POA: Diagnosis not present

## 2019-07-22 DIAGNOSIS — G894 Chronic pain syndrome: Secondary | ICD-10-CM | POA: Diagnosis not present

## 2019-08-05 DIAGNOSIS — M47817 Spondylosis without myelopathy or radiculopathy, lumbosacral region: Secondary | ICD-10-CM | POA: Diagnosis not present

## 2019-08-11 ENCOUNTER — Telehealth: Payer: Self-pay | Admitting: Internal Medicine

## 2019-08-11 MED ORDER — FLUCONAZOLE 150 MG PO TABS: 150.0000 mg | ORAL_TABLET | Freq: Once | ORAL | 0 refills | Status: AC

## 2019-08-11 NOTE — Telephone Encounter (Signed)
rx sent to [facility]

## 2019-08-11 NOTE — Telephone Encounter (Signed)
Patient is calling requesting a RX for a yeast infection. She states she went to urgent care and has been taking an abx. Patient informed she may need an appointment but requesting we ask Dr.Burns first.

## 2019-08-12 ENCOUNTER — Other Ambulatory Visit: Payer: Self-pay

## 2019-08-12 ENCOUNTER — Other Ambulatory Visit: Payer: Self-pay | Admitting: Internal Medicine

## 2019-08-12 ENCOUNTER — Ambulatory Visit (INDEPENDENT_AMBULATORY_CARE_PROVIDER_SITE_OTHER): Payer: Medicare Other | Admitting: *Deleted

## 2019-08-12 DIAGNOSIS — M25551 Pain in right hip: Secondary | ICD-10-CM | POA: Diagnosis not present

## 2019-08-12 DIAGNOSIS — M160 Bilateral primary osteoarthritis of hip: Secondary | ICD-10-CM | POA: Diagnosis not present

## 2019-08-12 DIAGNOSIS — M545 Low back pain: Secondary | ICD-10-CM | POA: Diagnosis not present

## 2019-08-12 DIAGNOSIS — Z79891 Long term (current) use of opiate analgesic: Secondary | ICD-10-CM | POA: Diagnosis not present

## 2019-08-12 DIAGNOSIS — E538 Deficiency of other specified B group vitamins: Secondary | ICD-10-CM

## 2019-08-12 DIAGNOSIS — G894 Chronic pain syndrome: Secondary | ICD-10-CM | POA: Diagnosis not present

## 2019-08-12 DIAGNOSIS — M7918 Myalgia, other site: Secondary | ICD-10-CM | POA: Diagnosis not present

## 2019-08-12 DIAGNOSIS — M5136 Other intervertebral disc degeneration, lumbar region: Secondary | ICD-10-CM | POA: Diagnosis not present

## 2019-08-12 MED ORDER — CYANOCOBALAMIN 1000 MCG/ML IJ SOLN
1000.0000 ug | Freq: Once | INTRAMUSCULAR | Status: AC
  Administered 2019-08-12: 1000 ug via INTRAMUSCULAR

## 2019-08-12 NOTE — Telephone Encounter (Signed)
Patient has been informed.

## 2019-08-12 NOTE — Progress Notes (Signed)
Pls cosign for B12 inj../lmb  

## 2019-08-19 DIAGNOSIS — M47817 Spondylosis without myelopathy or radiculopathy, lumbosacral region: Secondary | ICD-10-CM | POA: Diagnosis not present

## 2019-08-19 DIAGNOSIS — M160 Bilateral primary osteoarthritis of hip: Secondary | ICD-10-CM | POA: Diagnosis not present

## 2019-08-21 ENCOUNTER — Telehealth: Payer: Self-pay

## 2019-08-21 NOTE — Telephone Encounter (Signed)
New message    What type of DME ([place]) would like your provider to order? Rolling walker   Who would like to get the DME from? Open to suggestion    Last visit with PCP (>3 months requires and appointment for insurance to cover the cost = please schedule patient for visit to discuss medical necessity for DME)? 3.26.21

## 2019-08-21 NOTE — Telephone Encounter (Signed)
F/u    To discuss UTI what could be causing it .

## 2019-08-21 NOTE — Telephone Encounter (Signed)
States she has been getting injections from pain clinic that she thinks is steroid and lidocaine mix. She is wondering if this can cause her to have UTI's or irritation in her genital area. States after getting injections the last couple of times she has had that feeling of irritation.

## 2019-08-21 NOTE — Telephone Encounter (Signed)
Could cause a uti or yeast infection - would have to be evaluated.   - if she comes in for that can discuss walker (document for it)

## 2019-08-22 DIAGNOSIS — M25551 Pain in right hip: Secondary | ICD-10-CM | POA: Diagnosis not present

## 2019-08-22 DIAGNOSIS — G8929 Other chronic pain: Secondary | ICD-10-CM | POA: Diagnosis not present

## 2019-08-24 NOTE — Telephone Encounter (Signed)
F/u   Call patient   Appt with Dr. Quay Burow on Tuesday, June 8th  @  1:45pm

## 2019-08-24 NOTE — Progress Notes (Signed)
Subjective:    Patient ID: Misty [name], female    DOB: 15-Oct-1960, 59 y.o.   MRN: [id number]  HPI The patient is here for an acute visit.   ? UTI,?  Yeast infection:  She feels irritation when she urinates and wipes.   She has itching.  No discharge, no smell.  No dysuria, no change in urine color.  She applied benadryl cream.  She did a three day monistat and it helped.  The symptoms started after her first back injection which she believes was steroid and lidocaine.  The symptoms got better, but after the second injection and the symptoms started again.  She was also on an antibiotic at some point during all this time.  She is wondering what is causing it and if this is a urinary tract infection or a yeast infection.  When her symptoms recurred I did empirically prescribe Diflucan x1 because she had also just been on the antibiotic and that resolved the symptoms.  She is not currently having any symptoms, but is concerned about having them again.  She will continue to have injections in her back-approximately every 3 months.  B12 deficiency: She is due for her B12 injection and wanted to get that today.  She is taking all of her medications daily as prescribed.  She does need some refills.    Medications and allergies reviewed with patient and updated if appropriate.  Patient Active Problem List   Diagnosis Date Noted  . Leg ulcer ([place]) 06/12/2019  . COVID-19 virus infection 04/18/2019  . Urinary frequency 03/10/2019  . Restless leg syndrome 03/10/2019  . Hyperlipidemia 11/27/2018  . Anemia 11/25/2018  . Contact dermatitis 09/17/2018  . Nausea 09/17/2018  . Right groin pain 07/16/2018  . Rash and nonspecific skin eruption 04/16/2018  . Depression 03/03/2018  . Right shoulder pain 03/03/2018  . Physical deconditioning 03/03/2018  . Pancreatitis 02/22/2018  . Alcohol abuse 02/22/2018  . Cholelithiasis 02/22/2018  . Adhesive capsulitis of right shoulder 01/20/2018  .  Ventral hernia 09/16/2017  . Greater trochanteric bursitis of right hip 04/30/2017  . Prediabetes 04/16/2017  . Numbness 01/29/2017  . Chronic low back pain 07/20/2016  . Sacral mass 07/20/2016  . Unilateral primary osteoarthritis, right hip 07/20/2016  . Spinal stenosis of lumbar region 11/29/2015  . Positive urine drug screen 11/16/2015  . Chronic pain syndrome 10/12/2015  . Umbilical hernia 78/29/5621  . Sleeping difficulties 07/15/2015  . Bunion, right 06/20/2015  . Callus of foot 04/27/2015  . Muscle spasm 04/16/2015  . B12 deficiency 03/30/2015  . Hammer toe of left foot 02/16/2015  . Fibrosis of skin of lower extremity 02/16/2015  . Status post right foot surgery 10/26/2014  . Cervical spondylosis with radiculopathy 03/01/2014  . Hammer toe of right foot 01/26/2014  . Benign intracranial hypertension 06/18/2013  . Disturbance of skin sensation 04/21/2013  . Cephalalgia 04/21/2013  . Porokeratosis 03/31/2013  . Vitamin D deficiency 04/18/2010  . Low back pain 11/29/2008  . MEDIAL MENISCUS TEAR, LEFT 06/30/2008  . BARRETTS ESOPHAGUS 06/28/2008  . OSTEOARTHRITIS, KNEES, BILATERAL 06/28/2008  . OBESITY 05/24/2008  . HYPERTENSION, BENIGN ESSENTIAL 05/24/2008  . Irritable bowel syndrome 05/24/2008  . BARIATRIC SURGERY STATUS 03/19/2006  . NEOPLASM, MALIGNANT, COLON, HX OF 03/19/1997    Current Outpatient Medications on File Prior to Visit  Medication Sig Dispense Refill  . aspirin EC 81 MG tablet Take 81 mg by mouth daily.    . cyanocobalamin (,VITAMIN B-12,) 1000 MCG/ML  injection Inject 1,000 mcg into the muscle once.    . docusate sodium (COLACE) 100 MG capsule Take 1 capsule (100 mg total) by mouth 2 (two) times daily as needed for mild constipation. 60 capsule 5  . DULoxetine (CYMBALTA) 60 MG capsule 60 mg.    . gabapentin (NEURONTIN) 300 MG capsule TAKE 1 CAPSULE BY MOUTH IN THE MORNING AND 2 CAPSULES IN THE EVENING AND 2 CAPSULES EVERY DAY AT BEDTIME. 150 capsule 0    . hydrochlorothiazide (MICROZIDE) 12.5 MG capsule TAKE 1 CAPSULE BY MOUTH ONCE DAILY AS NEEDED FOR  SWELLING  IN  LEGS  OR  ELEVATED  BLOOD  PRESSURE 90 capsule 0  . lisinopril (ZESTRIL) 20 MG tablet Take 1 tablet by mouth once daily 90 tablet 0  . meloxicam (MOBIC) 15 MG tablet TAKE 1 TABLET BY MOUTH ONCE DAILY WITH FOOD 90 tablet 1  . NARCAN 4 MG/0.1ML LIQD nasal spray kit SPRAY 0.1 ML ('4MG'$ ) IN 1 NOSTRIL BY INTRANASAL ROUTE. MAY REPEAT DOSE EVERY 2 TO 3 MINUTES AS NEEDED ALTERNATING NOSTRILS WITH EACH DOSE    . omeprazole (PRILOSEC) 40 MG capsule Take 1 capsule (40 mg total) by mouth daily. 30 capsule 5  . ondansetron (ZOFRAN) 4 MG tablet Take 1 tablet (4 mg total) by mouth every 6 (six) hours as needed for nausea. 20 tablet 3  . Oxycodone HCl 10 MG TABS Take 10 mg by mouth 5 (five) times daily.     Marland Kitchen rOPINIRole (REQUIP) 0.25 MG tablet Take 1 tab at bedtime x two nights then increase to 2 tabs at bedtime. 60 tablet 3  . SANTYL ointment APPLY OINTMENT TO WOUND AREA CHANGE DAILY    . silver sulfADIAZINE (SILVADENE) 1 % cream 1 application topically to ulcer 1-2 times a day 50 g 0  . Syringe/Needle, Disp, (SYRINGE 3CC/25GX1") 25G X 1" 3 ML MISC Use for monthly B12 injections 3 each 3  . triamcinolone cream (KENALOG) 0.1 % Apply 1 application topically 2 (two) times daily.    Marland Kitchen triamcinolone cream (KENALOG) 0.5 % APPLY 1 APPLICATION TOPICALLY 3 TIMES DAILY 30 g 0  . VENTOLIN HFA 108 (90 Base) MCG/ACT inhaler Inhale 1-2 puffs into the lungs every 4 (four) hours as needed for wheezing or shortness of breath. 6.7 g 5  . XTAMPZA ER 13.5 MG C12A Patient reports she takes 18 mg 3 times a day    . diclofenac Sodium (VOLTAREN) 1 % GEL Apply 2 g topically 4 (four) times daily.     No current facility-administered medications on file prior to visit.    Past Medical History:  Diagnosis Date  . Allergy   . Arthritis   . Back pain   . Colon cancer ([place])   . Depression   . Diarrhea   . GERD  (gastroesophageal reflux disease)   . Hypertension   . Insomnia   . MS (multiple sclerosis) ([place])   . Other hammer toe (acquired) 03/31/2013  . Pneumonia    hx of  . Umbilical hernia     Past Surgical History:  Procedure Laterality Date  . ABDOMINAL HYSTERECTOMY    . ANTERIOR CERVICAL DECOMP/DISCECTOMY FUSION N/A 03/01/2014   Procedure: ANTERIOR CERVICAL DECOMPRESSION/DISCECTOMY FUSION 1 LEVEL;  Surgeon: Charlie Pitter, MD;  Location: [facility];  Service: Neurosurgery;  Laterality: N/A;  ANTERIOR CERVICAL DECOMPRESSION/DISCECTOMY FUSION 1 LEVEL  . BUNIONECTOMY Right 10/14/2014   '@[facility]'$   . CESAREAN SECTION    . COLON SURGERY    .  COLONOSCOPY    . FOOT SURGERY Right   . GASTRIC BYPASS    . Hammer Toe Repair Right 10/14/2014   RT #2, '@[facility]'$   . TENOTOMY Right 10/14/2014   RT #3, '@[facility]'$     Social History   Socioeconomic History  . Marital status: Single    Spouse name: Not on file  . Number of children: 2  . Years of education: Not on file  . Highest education level: Not on file  Occupational History  . Occupation: Disability  Tobacco Use  . Smoking status: Former Smoker    Packs/day: 0.25    Years: 20.00    Pack years: 5.00    Types: Cigarettes    Quit date: 09/22/2018    Years since quitting: 0.9  . Smokeless tobacco: Never Used  . Tobacco comment: states she quit 3 months ago  Substance and Sexual Activity  . Alcohol use: Yes    Alcohol/week: 0.0 standard drinks    Comment: 1/2 pint daily  . Drug use: No  . Sexual activity: Yes    Partners: Male  Other Topics Concern  . Not on file  Social History Narrative  . Not on file   Social Determinants of Health   Financial Resource Strain: Low Risk   . Difficulty of Paying Living Expenses: Not hard at all  Food Insecurity:   . Worried About Charity fundraiser in the Last Year:   . Arboriculturist in the Last Year:   Transportation Needs:   . Film/video editor (Medical):   Marland Kitchen Lack of Transportation (Non-Medical):     Physical Activity:   . Days of Exercise per Week:   . Minutes of Exercise per Session:   Stress:   . Feeling of Stress :   Social Connections:   . Frequency of Communication with Friends and Family:   . Frequency of Social Gatherings with Friends and Family:   . Attends Religious Services:   . Active Member of Clubs or Organizations:   . Attends Archivist Meetings:   Marland Kitchen Marital Status:     Family History  Problem Relation Age of Onset  . Stroke Mother   . Hypertension Mother   . Hyperlipidemia Mother   . Diabetes Mother   . Diabetes Brother   . Hypertension Brother   . Hypertension Brother   . Hypertension Brother   . Hypertension Brother   . Hypertension Brother   . Alcoholism Brother     Review of Systems  Constitutional: Negative for fever.  Respiratory: Negative for cough, shortness of breath and wheezing.   Cardiovascular: Negative for chest pain, palpitations and leg swelling.  Genitourinary: Negative for dysuria and vaginal discharge.  Neurological: Negative for headaches.       Objective:   Vitals:   08/25/19 1407  BP: 112/70  Pulse: 96  Temp: 98.5 F (36.9 C)  SpO2: 98%   BP Readings from Last 3 Encounters:  08/25/19 112/70  06/12/19 118/64  11/25/18 110/64   Wt Readings from Last 3 Encounters:  08/25/19 260 lb 3.2 oz (118 kg)  06/12/19 256 lb (116.1 kg)  03/03/19 261 lb (118.4 kg)   Body mass index is 44.66 kg/m.   Physical Exam    Constitutional: Appears well-developed and well-nourished. No distress.  Head: Normocephalic and atraumatic.  Neck: Neck supple. No tracheal deviation present. No thyromegaly present.  No cervical lymphadenopathy Cardiovascular: Normal rate, regular rhythm and normal heart sounds.  No murmur heard.  No carotid bruit .  No edema Pulmonary/Chest: Effort normal and breath sounds normal. No respiratory distress. No has no wheezes. No rales. Abdomen: Obese, soft, nontender Skin: Skin is warm and dry. Not  diaphoretic.  Psychiatric: Normal mood and affect. Behavior is normal.       Assessment & Plan:    See Problem List for Assessment and Plan of chronic medical problems.    This visit occurred during the SARS-CoV-2 public health emergency.  Safety protocols were in place, including screening questions prior to the visit, additional usage of staff PPE, and extensive cleaning of exam room while observing appropriate contact time as indicated for disinfecting solutions.

## 2019-08-25 ENCOUNTER — Other Ambulatory Visit: Payer: Self-pay

## 2019-08-25 ENCOUNTER — Encounter: Payer: Self-pay | Admitting: Internal Medicine

## 2019-08-25 ENCOUNTER — Ambulatory Visit (INDEPENDENT_AMBULATORY_CARE_PROVIDER_SITE_OTHER): Payer: Medicare Other | Admitting: Internal Medicine

## 2019-08-25 VITALS — BP 112/70 | HR 96 | Temp 98.5°F | Ht 64.0 in | Wt 260.2 lb

## 2019-08-25 DIAGNOSIS — M62838 Other muscle spasm: Secondary | ICD-10-CM

## 2019-08-25 DIAGNOSIS — I10 Essential (primary) hypertension: Secondary | ICD-10-CM

## 2019-08-25 DIAGNOSIS — E538 Deficiency of other specified B group vitamins: Secondary | ICD-10-CM | POA: Diagnosis not present

## 2019-08-25 MED ORDER — FLUCONAZOLE 150 MG PO TABS: 150.0000 mg | ORAL_TABLET | Freq: Every day | ORAL | 0 refills | Status: DC | PRN

## 2019-08-25 MED ORDER — GABAPENTIN 300 MG PO CAPS: ORAL_CAPSULE | ORAL | 5 refills | Status: DC

## 2019-08-25 MED ORDER — LISINOPRIL 20 MG PO TABS: 20.0000 mg | ORAL_TABLET | Freq: Every day | ORAL | 1 refills | Status: DC

## 2019-08-25 MED ORDER — CYANOCOBALAMIN 1000 MCG/ML IJ SOLN
1000.0000 ug | Freq: Once | INTRAMUSCULAR | Status: AC
  Administered 2019-08-25: 1000 ug via INTRAMUSCULAR

## 2019-08-25 NOTE — Patient Instructions (Addendum)
Take the fluconazole as needed after a steroid injection.  Your other prescriptions were sent as well.     Follow up 1-2  Months for a physical

## 2019-08-25 NOTE — Assessment & Plan Note (Signed)
Chronic Has intermittent muscle spasms Taking gabapentin as prescribed, which does help We will continue-refilled today

## 2019-08-25 NOTE — Assessment & Plan Note (Signed)
Chronic BP well controlled Current regimen effective and well tolerated Continue current medications at current doses  

## 2019-08-25 NOTE — Assessment & Plan Note (Signed)
Chronic B12 injections monthly Deficiency likely secondary to history of gastric bypass B12 injection today

## 2019-09-09 DIAGNOSIS — M47817 Spondylosis without myelopathy or radiculopathy, lumbosacral region: Secondary | ICD-10-CM | POA: Diagnosis not present

## 2019-09-16 ENCOUNTER — Other Ambulatory Visit: Payer: Self-pay

## 2019-09-16 ENCOUNTER — Encounter: Payer: Self-pay | Admitting: Podiatry

## 2019-09-16 ENCOUNTER — Ambulatory Visit (INDEPENDENT_AMBULATORY_CARE_PROVIDER_SITE_OTHER): Payer: Medicare Other | Admitting: Podiatry

## 2019-09-16 DIAGNOSIS — Q828 Other specified congenital malformations of skin: Secondary | ICD-10-CM

## 2019-09-16 DIAGNOSIS — L84 Corns and callosities: Secondary | ICD-10-CM | POA: Diagnosis not present

## 2019-09-16 NOTE — Progress Notes (Signed)
This patient presents to the office to have her callus trimmed.  She says she expects to walk a lot and desires the callus to be worked on.  She says the callus is painful walking.  She says she will get a pedicure done in thew near future.  She presents for preventative foot care services.   Vascular  Dorsalis pedis and posterior tibial pulses are palpable  B/L.  Capillary return  WNL.  Temperature gradient is  WNL.  Skin turgor  WNL  Sensorium  Senn Weinstein monofilament wire  WNL. Normal tactile sensation.  Nail Exam  Patient has normal nails with no evidence of bacterial or fungal infection.  Orthopedic  Exam  Muscle tone and muscle strength  WNL.  No limitations of motion feet  B/L.  No crepitus or joint effusion noted.  Foot type is unremarkable and digits show no abnormalities.  HAV 1st MPJ right foot.  Skin  No open lesions.  Normal skin texture and turgor. Porokeratosis sub 3,5 right foot.  Porokeratosis  Right foot.  Debride porokeratosis using a # 15 blade.  RTC prn.   Gardiner Barefoot DPM

## 2019-09-21 DIAGNOSIS — G8929 Other chronic pain: Secondary | ICD-10-CM | POA: Diagnosis not present

## 2019-09-21 DIAGNOSIS — M25551 Pain in right hip: Secondary | ICD-10-CM | POA: Diagnosis not present

## 2019-09-22 NOTE — Progress Notes (Signed)
Subjective:    Patient ID: Misty [name], female    DOB: 1960/03/29, 59 y.o.   MRN: [id number]  HPI She is here for a physical exam.    She had nerve ablation in the lower back two weeks ago.  She is following with pain management. She is still having pain.   Mobility Evaluation - need for standing walker with seat. Patient suffers from back pain, knee pain, shoulder pain which impairs their ability to perform daily activities like toileting, feeding, dressing, grooming, bathing. In the home. A cane and crutch will not resolve issue with performing activities of daily living due to poor balance. A standing walker will allow patient to safely perform daily activities and prevent her from being hunched over increasing her back pain.     Medications and allergies reviewed with patient and updated if appropriate.  Patient Active Problem List   Diagnosis Date Noted  . Leg ulcer ([place]) 06/12/2019  . COVID-19 virus infection 04/18/2019  . Urinary frequency 03/10/2019  . Restless leg syndrome 03/10/2019  . Hyperlipidemia 11/27/2018  . Anemia 11/25/2018  . Nausea 09/17/2018  . Right groin pain 07/16/2018  . Depression 03/03/2018  . Right shoulder pain 03/03/2018  . Pancreatitis 02/22/2018  . Alcohol abuse 02/22/2018  . Cholelithiasis 02/22/2018  . Adhesive capsulitis of right shoulder 01/20/2018  . Ventral hernia 09/16/2017  . Greater trochanteric bursitis of right hip 04/30/2017  . Prediabetes 04/16/2017  . Numbness 01/29/2017  . Chronic low back pain 07/20/2016  . Unilateral primary osteoarthritis, right hip 07/20/2016  . Spinal stenosis of lumbar region 11/29/2015  . Positive urine drug screen 11/16/2015  . Chronic pain syndrome 10/12/2015  . Umbilical hernia 78/67/6720  . Sleeping difficulties 07/15/2015  . Bunion, right 06/20/2015  . Callus of foot 04/27/2015  . Muscle spasm 04/16/2015  . B12 deficiency 03/30/2015  . Hammer toe of left foot 02/16/2015  . Fibrosis  of skin of lower extremity 02/16/2015  . Cervical spondylosis with radiculopathy 03/01/2014  . Hammer toe of right foot 01/26/2014  . Benign intracranial hypertension 06/18/2013  . Cephalalgia 04/21/2013  . Porokeratosis 03/31/2013  . Vitamin D deficiency 04/18/2010  . Low back pain 11/29/2008  . MEDIAL MENISCUS TEAR, LEFT 06/30/2008  . BARRETTS ESOPHAGUS 06/28/2008  . OSTEOARTHRITIS, KNEES, BILATERAL 06/28/2008  . OBESITY 05/24/2008  . HYPERTENSION, BENIGN ESSENTIAL 05/24/2008  . Irritable bowel syndrome 05/24/2008  . BARIATRIC SURGERY STATUS 03/19/2006  . NEOPLASM, MALIGNANT, COLON, HX OF 03/19/1997    Current Outpatient Medications on File Prior to Visit  Medication Sig Dispense Refill  . aspirin EC 81 MG tablet Take 81 mg by mouth daily.    . cyanocobalamin (,VITAMIN B-12,) 1000 MCG/ML injection Inject 1,000 mcg into the muscle once.    . diclofenac Sodium (VOLTAREN) 1 % GEL Apply 2 g topically 4 (four) times daily.    Marland Kitchen docusate sodium (COLACE) 100 MG capsule Take 1 capsule (100 mg total) by mouth 2 (two) times daily as needed for mild constipation. 60 capsule 5  . DULoxetine (CYMBALTA) 60 MG capsule 60 mg.    . fluconazole (DIFLUCAN) 150 MG tablet Take 1 tablet (150 mg total) by mouth daily as needed. 5 tablet 0  . gabapentin (NEURONTIN) 300 MG capsule TAKE 1 CAPSULE BY MOUTH IN THE MORNING AND 2 CAPSULES IN THE EVENING AND 2 CAPSULES EVERY DAY AT BEDTIME. 150 capsule 5  . hydrochlorothiazide (MICROZIDE) 12.5 MG capsule TAKE 1 CAPSULE BY MOUTH ONCE DAILY AS NEEDED  FOR  SWELLING  IN  LEGS  OR  ELEVATED  BLOOD  PRESSURE 90 capsule 0  . lisinopril (ZESTRIL) 20 MG tablet Take 1 tablet (20 mg total) by mouth daily. 90 tablet 1  . meloxicam (MOBIC) 15 MG tablet TAKE 1 TABLET BY MOUTH ONCE DAILY WITH FOOD 90 tablet 1  . NARCAN 4 MG/0.1ML LIQD nasal spray kit SPRAY 0.1 ML (4MG) IN 1 NOSTRIL BY INTRANASAL ROUTE. MAY REPEAT DOSE EVERY 2 TO 3 MINUTES AS NEEDED ALTERNATING NOSTRILS WITH EACH  DOSE    . omeprazole (PRILOSEC) 40 MG capsule Take 1 capsule (40 mg total) by mouth daily. 30 capsule 5  . ondansetron (ZOFRAN) 4 MG tablet Take 1 tablet (4 mg total) by mouth every 6 (six) hours as needed for nausea. 20 tablet 3  . Oxycodone HCl 10 MG TABS Take 10 mg by mouth 5 (five) times daily.     Marland Kitchen rOPINIRole (REQUIP) 0.25 MG tablet Take 1 tab at bedtime x two nights then increase to 2 tabs at bedtime. 60 tablet 3  . SANTYL ointment APPLY OINTMENT TO WOUND AREA CHANGE DAILY    . silver sulfADIAZINE (SILVADENE) 1 % cream 1 application topically to ulcer 1-2 times a day 50 g 0  . Syringe/Needle, Disp, (SYRINGE 3CC/25GX1") 25G X 1" 3 ML MISC Use for monthly B12 injections 3 each 3  . triamcinolone cream (KENALOG) 0.1 % Apply 1 application topically 2 (two) times daily.    . VENTOLIN HFA 108 (90 Base) MCG/ACT inhaler Inhale 1-2 puffs into the lungs every 4 (four) hours as needed for wheezing or shortness of breath. 6.7 g 5  . XTAMPZA ER 18 MG C12A Take 1 capsule by mouth every 12 (twelve) hours.     No current facility-administered medications on file prior to visit.    Past Medical History:  Diagnosis Date  . Allergy   . Arthritis   . Back pain   . Colon cancer ([place])   . Depression   . Diarrhea   . GERD (gastroesophageal reflux disease)   . Hypertension   . Insomnia   . MS (multiple sclerosis) ([place])   . Other hammer toe (acquired) 03/31/2013  . Pneumonia    hx of  . Umbilical hernia     Past Surgical History:  Procedure Laterality Date  . ABDOMINAL HYSTERECTOMY    . ANTERIOR CERVICAL DECOMP/DISCECTOMY FUSION N/A 03/01/2014   Procedure: ANTERIOR CERVICAL DECOMPRESSION/DISCECTOMY FUSION 1 LEVEL;  Surgeon: Charlie Pitter, MD;  Location: [facility];  Service: Neurosurgery;  Laterality: N/A;  ANTERIOR CERVICAL DECOMPRESSION/DISCECTOMY FUSION 1 LEVEL  . BUNIONECTOMY Right 10/14/2014   @[facility]   . CESAREAN SECTION    . COLON SURGERY    . COLONOSCOPY    . FOOT SURGERY Right   .  GASTRIC BYPASS    . Hammer Toe Repair Right 10/14/2014   RT #2, @[facility]   . TENOTOMY Right 10/14/2014   RT #3, @[facility]     Social History   Socioeconomic History  . Marital status: Single    Spouse name: Not on file  . Number of children: 2  . Years of education: Not on file  . Highest education level: Not on file  Occupational History  . Occupation: Disability  Tobacco Use  . Smoking status: Former Smoker    Packs/day: 0.25    Years: 20.00    Pack years: 5.00    Types: Cigarettes    Quit date: 09/22/2018    Years since quitting: 1.0  .  Smokeless tobacco: Never Used  . Tobacco comment: states she quit 3 months ago  Vaping Use  . Vaping Use: Never used  Substance and Sexual Activity  . Alcohol use: Yes    Alcohol/week: 0.0 standard drinks    Comment: 1/2 pint daily  . Drug use: No  . Sexual activity: Yes    Partners: Male  Other Topics Concern  . Not on file  Social History Narrative  . Not on file   Social Determinants of Health   Financial Resource Strain: Low Risk   . Difficulty of Paying Living Expenses: Not hard at all  Food Insecurity:   . Worried About Charity fundraiser in the Last Year:   . Arboriculturist in the Last Year:   Transportation Needs:   . Film/video editor (Medical):   Marland Kitchen Lack of Transportation (Non-Medical):   Physical Activity:   . Days of Exercise per Week:   . Minutes of Exercise per Session:   Stress:   . Feeling of Stress :   Social Connections:   . Frequency of Communication with Friends and Family:   . Frequency of Social Gatherings with Friends and Family:   . Attends Religious Services:   . Active Member of Clubs or Organizations:   . Attends Archivist Meetings:   Marland Kitchen Marital Status:     Family History  Problem Relation Age of Onset  . Stroke Mother   . Hypertension Mother   . Hyperlipidemia Mother   . Diabetes Mother   . Diabetes Brother   . Hypertension Brother   . Hypertension Brother   . Hypertension  Brother   . Hypertension Brother   . Hypertension Brother   . Alcoholism Brother     Review of Systems  Constitutional: Negative for chills and fever.  Eyes: Negative for visual disturbance.  Respiratory: Positive for chest tightness (occ - humidity). Negative for cough, shortness of breath and wheezing.   Cardiovascular: Negative for chest pain and palpitations.  Gastrointestinal: Positive for nausea. Negative for abdominal pain, blood in stool, constipation and diarrhea.       No gerd  Genitourinary: Negative for dysuria.  Musculoskeletal: Positive for arthralgias and back pain.  Skin: Negative for rash.  Neurological: Positive for headaches (occ). Negative for light-headedness.  Psychiatric/Behavioral: Positive for dysphoric mood (maybe). The patient is not nervous/anxious.        Objective:   Vitals:   09/23/19 1404  BP: 122/70  Pulse: (!) 101  Temp: 98.2 F (36.8 C)  SpO2: 97%   Filed Weights   09/23/19 1404  Weight: 264 lb (119.7 kg)   Body mass index is 45.32 kg/m.  BP Readings from Last 3 Encounters:  09/23/19 122/70  08/25/19 112/70  06/12/19 118/64    Wt Readings from Last 3 Encounters:  09/23/19 264 lb (119.7 kg)  08/25/19 260 lb 3.2 oz (118 kg)  06/12/19 256 lb (116.1 kg)     Physical Exam Constitutional: She appears well-developed and well-nourished. No distress.  HENT:  Head: Normocephalic and atraumatic.  Right Ear: External ear normal. Normal ear canal and TM Left Ear: External ear normal.  Normal ear canal and TM Mouth/Throat: Oropharynx is clear and moist.  Eyes: Conjunctivae and EOM are normal.  Neck: Neck supple. No tracheal deviation present. No thyromegaly present.  No carotid bruit  Cardiovascular: Normal rate, regular rhythm and normal heart sounds.   No murmur heard.  No edema. Pulmonary/Chest: Effort normal and breath  sounds normal. No respiratory distress. She has no wheezes. She has no rales.  Breast: deferred   Abdominal:  Soft. She exhibits no distension. There is no tenderness.  Lymphadenopathy: She has no cervical adenopathy.  Skin: Skin is warm and dry. She is not diaphoretic.  Psychiatric: She has a normal mood and affect. Her behavior is normal.        Assessment & Plan:   Physical exam: Screening blood work    ordered Immunizations  Td due,  Had covid, discussed shingrix Colonoscopy  Due - will schedule - [facility]  Due - will schedule Gyn  - recommended - name given Eye exams  Up to date  Exercise   Walking, floor pedaling machine, weights Weight   Advised weight loss  - wants to lose weight Substance abuse   - 1 bottle on cognac every 3-4 days - stressed abstinence.    See Problem List for Assessment and Plan of chronic medical problems.   This visit occurred during the SARS-CoV-2 public health emergency.  Safety protocols were in place, including screening questions prior to the visit, additional usage of staff PPE, and extensive cleaning of exam room while observing appropriate contact time as indicated for disinfecting solutions.

## 2019-09-22 NOTE — Patient Instructions (Addendum)
Misty [name]  [address]  [place], [place] [address]  Main: [place] [address] [employer] [place] [place] [address] [phone]   [facility] [address] [place], [place] Across the street from [facility] [phone]     Blood work was ordered.    All other Health Maintenance issues reviewed.   All recommended immunizations and age-appropriate screenings are up-to-date or discussed.  No immunization administered today.   Medications reviewed and updated.  Changes include :   Start atorvastatin 20 mg daily for your cholesterol. Increase gabapentin as prescribed.   Your prescription(s) have been submitted to your pharmacy. Please take as directed and contact our office if you believe you are having problem(s) with the medication(s).    Please followup in 6 months    Health Maintenance, Female Adopting a healthy lifestyle and getting preventive care are important in promoting health and wellness. Ask your health care provider about:  The right schedule for you to have regular tests and exams.  Things you can do on your own to prevent diseases and keep yourself healthy. What should I know about diet, weight, and exercise? Eat a healthy diet   Eat a diet that includes plenty of vegetables, fruits, low-fat dairy products, and lean protein.  Do not eat a lot of foods that are high in solid fats, added sugars, or sodium. Maintain a healthy weight Body mass index (BMI) is used to identify weight problems. It estimates body fat based on height and weight. Your health care provider can help determine your BMI and help you achieve or maintain a healthy weight. Get regular exercise Get regular exercise. This is one of the most important things you can do for your health. Most adults should:  Exercise for  at least 150 minutes each week. The exercise should increase your heart rate and make you sweat (moderate-intensity exercise).  Do strengthening exercises at least twice a week. This is in addition to the moderate-intensity exercise.  Spend less time sitting. Even light physical activity can be beneficial. Watch cholesterol and blood lipids Have your blood tested for lipids and cholesterol at 58 years of age, then have this test every 5 years. Have your cholesterol levels checked more often if:  Your lipid or cholesterol levels are high.  You are older than 58 years of age.  You are at high risk for heart disease. What should I know about cancer screening? Depending on your health history and family history, you may need to have cancer screening at various ages. This may include screening for:  Breast cancer.  Cervical cancer.  Colorectal cancer.  Skin cancer.  Lung cancer. What should I know about heart disease, diabetes, and high blood pressure? Blood pressure and heart disease  High blood pressure causes heart disease and increases the risk of stroke. This is more likely to develop in people who have high blood pressure readings, are of African descent, or are overweight.  Have your blood pressure checked: ? Every 3-5 years if you are 54-96 years of age. ? Every year if you are 39 years old or older. Diabetes Have regular diabetes screenings. This checks your fasting blood sugar level. Have the screening done:  Once every three years after age 53 if you are at a normal weight and have a low risk for diabetes.  More often and at a  younger age if you are overweight or have a high risk for diabetes. What should I know about preventing infection? Hepatitis B If you have a higher risk for hepatitis B, you should be screened for this virus. Talk with your health care provider to find out if you are at risk for hepatitis B infection. Hepatitis C Testing is recommended  for:  Everyone born from [birth year] through 1964.  Anyone with known risk factors for hepatitis C. Sexually transmitted infections (STIs)  Get screened for STIs, including gonorrhea and chlamydia, if: ? You are sexually active and are younger than 58 years of age. ? You are older than 58 years of age and your health care provider tells you that you are at risk for this type of infection. ? Your sexual activity has changed since you were last screened, and you are at increased risk for chlamydia or gonorrhea. Ask your health care provider if you are at risk.  Ask your health care provider about whether you are at high risk for HIV. Your health care provider may recommend a prescription medicine to help prevent HIV infection. If you choose to take medicine to prevent HIV, you should first get tested for HIV. You should then be tested every 3 months for as long as you are taking the medicine. Pregnancy  If you are about to stop having your period (premenopausal) and you may become pregnant, seek counseling before you get pregnant.  Take 400 to 800 micrograms (mcg) of folic acid every day if you become pregnant.  Ask for birth control (contraception) if you want to prevent pregnancy. Osteoporosis and menopause Osteoporosis is a disease in which the bones lose minerals and strength with aging. This can result in bone fractures. If you are 38 years old or older, or if you are at risk for osteoporosis and fractures, ask your health care provider if you should:  Be screened for bone loss.  Take a calcium or vitamin D supplement to lower your risk of fractures.  Be given hormone replacement therapy (HRT) to treat symptoms of menopause. Follow these instructions at home: Lifestyle  Do not use any products that contain nicotine or tobacco, such as cigarettes, e-cigarettes, and chewing tobacco. If you need help quitting, ask your health care provider.  Do not use street drugs.  Do not share  needles.  Ask your health care provider for help if you need support or information about quitting drugs. Alcohol use  Do not drink alcohol if: ? Your health care provider tells you not to drink. ? You are pregnant, may be pregnant, or are planning to become pregnant.  If you drink alcohol: ? Limit how much you use to 0-1 drink a day. ? Limit intake if you are breastfeeding.  Be aware of how much alcohol is in your drink. In the [place] one drink equals one 12 oz bottle of beer (355 mL), one 5 oz glass of wine (148 mL), or one 1 oz glass of hard liquor (44 mL). General instructions  Schedule regular health, dental, and eye exams.  Stay current with your vaccines.  Tell your health care provider if: ? You often feel depressed. ? You have ever been abused or do not feel safe at home. Summary  Adopting a healthy lifestyle and getting preventive care are important in promoting health and wellness.  Follow your health care provider's instructions about healthy diet, exercising, and getting tested or screened for diseases.  Follow your health care provider's instructions  on monitoring your cholesterol and blood pressure. This information is not intended to replace advice given to you by your health care provider. Make sure you discuss any questions you have with your health care provider. Document Revised: 02/26/2018 Document Reviewed: 02/26/2018 Elsevier Patient Education  2020 [employer].

## 2019-09-23 ENCOUNTER — Ambulatory Visit (INDEPENDENT_AMBULATORY_CARE_PROVIDER_SITE_OTHER): Payer: Medicare Other | Admitting: Internal Medicine

## 2019-09-23 ENCOUNTER — Encounter: Payer: Self-pay | Admitting: Internal Medicine

## 2019-09-23 ENCOUNTER — Other Ambulatory Visit: Payer: Self-pay

## 2019-09-23 VITALS — BP 122/70 | HR 101 | Temp 98.2°F | Ht 64.0 in | Wt 264.0 lb

## 2019-09-23 DIAGNOSIS — M170 Bilateral primary osteoarthritis of knee: Secondary | ICD-10-CM

## 2019-09-23 DIAGNOSIS — Z0000 Encounter for general adult medical examination without abnormal findings: Secondary | ICD-10-CM | POA: Diagnosis not present

## 2019-09-23 DIAGNOSIS — E7849 Other hyperlipidemia: Secondary | ICD-10-CM

## 2019-09-23 DIAGNOSIS — M545 Low back pain, unspecified: Secondary | ICD-10-CM

## 2019-09-23 DIAGNOSIS — I10 Essential (primary) hypertension: Secondary | ICD-10-CM

## 2019-09-23 DIAGNOSIS — G8929 Other chronic pain: Secondary | ICD-10-CM

## 2019-09-23 DIAGNOSIS — G2581 Restless legs syndrome: Secondary | ICD-10-CM

## 2019-09-23 DIAGNOSIS — R7303 Prediabetes: Secondary | ICD-10-CM

## 2019-09-23 DIAGNOSIS — F1010 Alcohol abuse, uncomplicated: Secondary | ICD-10-CM

## 2019-09-23 DIAGNOSIS — K22719 Barrett's esophagus with dysplasia, unspecified: Secondary | ICD-10-CM | POA: Diagnosis not present

## 2019-09-23 DIAGNOSIS — M1611 Unilateral primary osteoarthritis, right hip: Secondary | ICD-10-CM

## 2019-09-23 DIAGNOSIS — Z85038 Personal history of other malignant neoplasm of large intestine: Secondary | ICD-10-CM

## 2019-09-23 LAB — LIPID PANEL
Cholesterol: 240 mg/dL — ABNORMAL HIGH (ref 0–200)
HDL: 73.3 mg/dL (ref >39.00)
LDL Cholesterol: 144 mg/dL — ABNORMAL HIGH (ref 0–99)
NonHDL: 166.38
Total CHOL/HDL Ratio: 3
Triglycerides: 111 mg/dL (ref 0.0–149.0)
VLDL: 22.2 mg/dL (ref 0.0–40.0)

## 2019-09-23 LAB — CBC WITH DIFFERENTIAL/PLATELET
Basophils Absolute: 0 10*3/uL (ref 0.0–0.1)
Basophils Relative: 0.7 % (ref 0.0–3.0)
Eosinophils Absolute: 0.1 10*3/uL (ref 0.0–0.7)
Eosinophils Relative: 2.5 % (ref 0.0–5.0)
HCT: 35.7 % — ABNORMAL LOW (ref 36.0–46.0)
Hemoglobin: 11.3 g/dL — ABNORMAL LOW (ref 12.0–15.0)
Lymphocytes Relative: 27.2 % (ref 12.0–46.0)
Lymphs Abs: 1.5 10*3/uL (ref 0.7–4.0)
MCHC: 31.7 g/dL (ref 30.0–36.0)
MCV: 97.2 fl (ref 78.0–100.0)
Monocytes Absolute: 0.5 10*3/uL (ref 0.1–1.0)
Monocytes Relative: 9.9 % (ref 3.0–12.0)
Neutro Abs: 3.2 10*3/uL (ref 1.4–7.7)
Neutrophils Relative %: 59.7 % (ref 43.0–77.0)
Platelets: 184 10*3/uL (ref 150.0–400.0)
RBC: 3.67 Mil/uL — ABNORMAL LOW (ref 3.87–5.11)
RDW: 14.4 % (ref 11.5–15.5)
WBC: 5.3 10*3/uL (ref 4.0–10.5)

## 2019-09-23 LAB — COMPREHENSIVE METABOLIC PANEL
ALT: 14 U/L (ref 0–35)
AST: 15 U/L (ref 0–37)
Albumin: 3.9 g/dL (ref 3.5–5.2)
Alkaline Phosphatase: 109 U/L (ref 39–117)
BUN: 19 mg/dL (ref 6–23)
CO2: 29 mEq/L (ref 19–32)
Calcium: 9.4 mg/dL (ref 8.4–10.5)
Chloride: 107 mEq/L (ref 96–112)
Creatinine, Ser: 0.96 mg/dL (ref 0.40–1.20)
GFR: 72.12 mL/min (ref >60.00)
Glucose, Bld: 95 mg/dL (ref 70–99)
Potassium: 4.4 mEq/L (ref 3.5–5.1)
Sodium: 141 mEq/L (ref 135–145)
Total Bilirubin: 0.5 mg/dL (ref 0.2–1.2)
Total Protein: 6.9 g/dL (ref 6.0–8.3)

## 2019-09-23 LAB — TSH: TSH: 0.45 u[IU]/mL (ref 0.35–4.50)

## 2019-09-23 LAB — HEMOGLOBIN A1C: Hgb A1c MFr Bld: 5.5 % (ref 4.6–6.5)

## 2019-09-23 MED ORDER — ATORVASTATIN CALCIUM 20 MG PO TABS: 20.0000 mg | ORAL_TABLET | Freq: Every day | ORAL | 1 refills | Status: DC

## 2019-09-23 MED ORDER — GABAPENTIN 300 MG PO CAPS: ORAL_CAPSULE | ORAL | 5 refills | Status: DC

## 2019-09-23 MED ORDER — ONDANSETRON HCL 4 MG PO TABS: 4.0000 mg | ORAL_TABLET | Freq: Four times a day (QID) | ORAL | 0 refills | Status: DC | PRN

## 2019-09-23 NOTE — Assessment & Plan Note (Signed)
Chronic No relief from requip, so she stopped taking it drinking alcohol nightly - stressed this is aggravating her RLS and stressed abstinence.

## 2019-09-23 NOTE — Assessment & Plan Note (Signed)
Chronic Would like evaluation by ortho again and would like to see Dr Wynelle Link - referral ordered

## 2019-09-23 NOTE — Assessment & Plan Note (Signed)
Denies GERD taking omeprazole 40 mg daily Continue daily medication

## 2019-09-23 NOTE — Assessment & Plan Note (Signed)
Chronic Check a1c Low sugar / carb diet Stressed regular exercise Encouraged weight loss 

## 2019-09-23 NOTE — Assessment & Plan Note (Signed)
Chronic BP well controlled Current regimen effective and well tolerated Continue current medications at current doses cmp  

## 2019-09-23 NOTE — Assessment & Plan Note (Signed)
Chronic Check lipid panel  Start lipitor 20 mg Regular exercise and healthy diet encouraged Work on weight loss

## 2019-09-23 NOTE — Assessment & Plan Note (Signed)
Colonoscopy due - working on getting this scheduled with [facility]

## 2019-09-23 NOTE — Assessment & Plan Note (Signed)
History of heavy alcohol abuse after her son's death. Was hospitalized with pancreatitis at that time, did go into remission, but has relapsed and is now drinking 1 bottle of cognac every 3-4 days.   Stressed abstinence of all etoh

## 2019-09-23 NOTE — Assessment & Plan Note (Signed)
B/l Knee OA With pain and difficulty ambulating Needs standing walker for ambulation

## 2019-09-24 ENCOUNTER — Other Ambulatory Visit: Payer: Self-pay | Admitting: Internal Medicine

## 2019-09-24 DIAGNOSIS — D649 Anemia, unspecified: Secondary | ICD-10-CM

## 2019-09-24 DIAGNOSIS — E7849 Other hyperlipidemia: Secondary | ICD-10-CM

## 2019-10-01 DIAGNOSIS — M170 Bilateral primary osteoarthritis of knee: Secondary | ICD-10-CM | POA: Diagnosis not present

## 2019-10-01 DIAGNOSIS — R5381 Other malaise: Secondary | ICD-10-CM | POA: Diagnosis not present

## 2019-10-06 IMAGING — MR MR 3D RECON AT SCANNER
18 of 21 series · 44 of 48 positions shown · IV contrast (Gadavist)
Comparison: CT 02/22/2018, ultrasound 02/25/2018

CLINICAL DATA: RIGHT upper quadrant pain.  Cholelithiasis

EXAM:
MRI ABDOMEN WITHOUT AND WITH CONTRAST (INCLUDING MRCP)
TECHNIQUE: Multiplanar multisequence MR imaging of the abdomen was performed
both before and after the administration of intravenous contrast.
Heavily T2-weighted images of the biliary and pancreatic ducts were
obtained, and three-dimensional MRCP images were rendered by post
processing.
CONTRAST:  8 mL Gadavist

[Series 5: bSSFP · coronal · 6.0mm · 0.86mm/px · 1 of 36 slices shown]
[im 1/36]
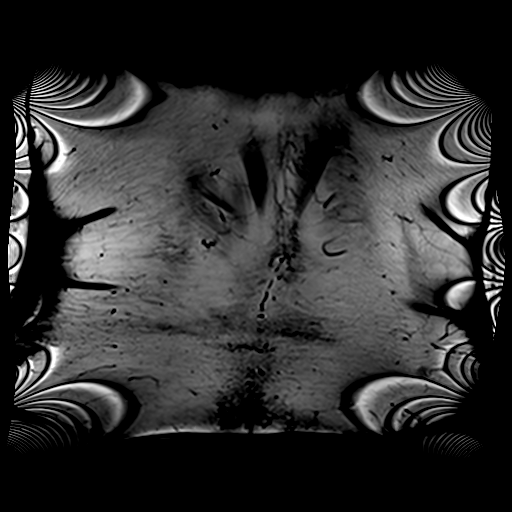

[Series 6: ax haste · axial · 6.0mm · 1.31mm/px · 1 of 36 slices shown]
[im 1/36]
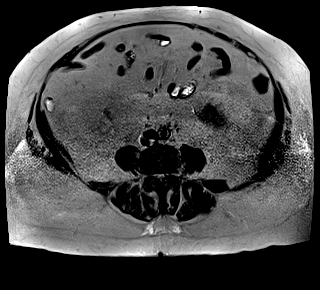

[Series 9: T2 fat-sat · axial · 6.0mm · 1.38mm/px · 1 of 40 slices shown]
[im 1/40]
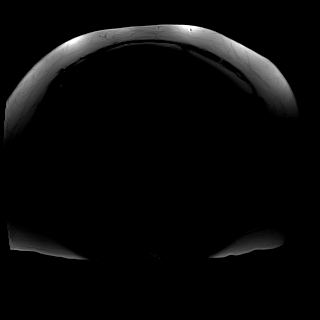

[Series 10: DWI · axial · 6.0mm · 1.64mm/px · z∈[-188,+93]mm · 3 of 120 slices shown (1 of 2)]
[im 1/120]
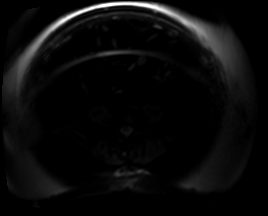
[im 60/120]
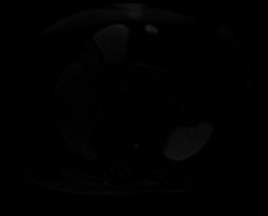
[im 120/120]
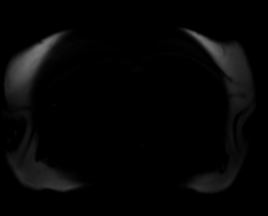

[Series 11: DWI · axial · 6.0mm · 1.64mm/px · 1 of 40 slices shown (2 of 2)]
[im 1/40]
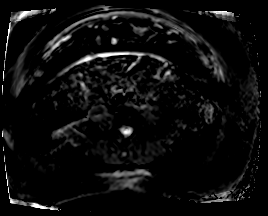

[Series 17: ax in and · axial · 3.0mm · 1.38mm/px · z∈[-149,+88]mm · 5 of 160 slices shown]
[im 1/160]
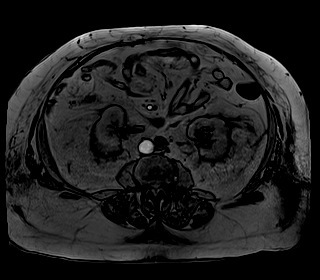
[im 40/160]
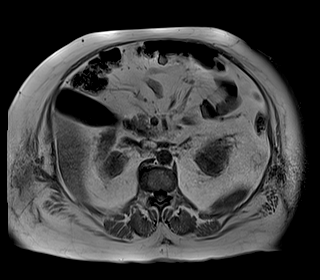
[im 80/160]
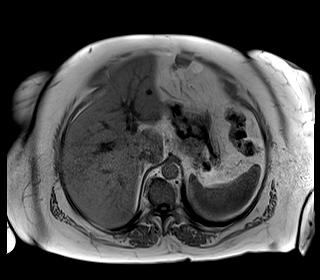
[im 120/160]
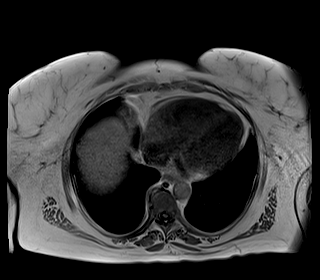
[im 160/160]
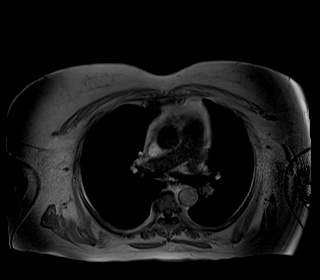

[Series 18: MRCP · coronal · 4.0mm · 1.12mm/px · 1 of 15 slices shown]
[im 1/15]
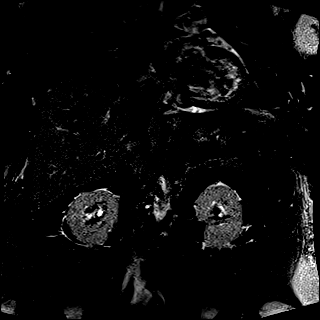

[Series 19: radials · coronal · 50.0mm · 0.78mm/px · 1 of 5 slices shown]
[im 1/5]
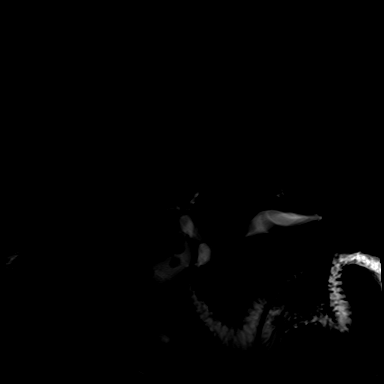

[Series 20: T1 dynamic · axial · non-contrast · 3.0mm · 1.38mm/px · z∈[-149,+88]mm · 3 of 80 slices shown (1 of 5)]
[im 1/80]
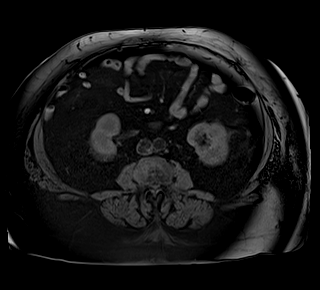
[im 40/80]
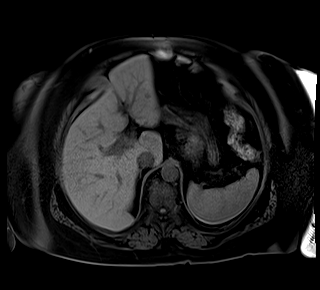
[im 80/80]
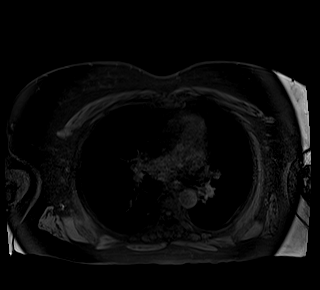

[Series 22: T1 dynamic post-contrast · axial · 3.0mm · 1.38mm/px · z∈[-149,+88]mm · 3 of 80 slices shown (1 of 5)]
[im 1/80]
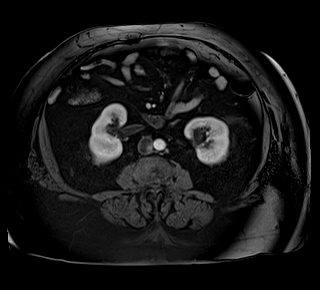
[im 40/80]
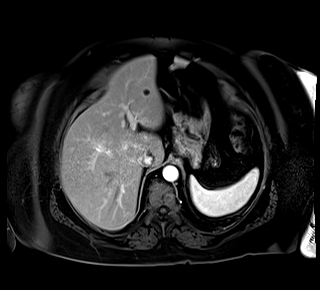
[im 80/80]
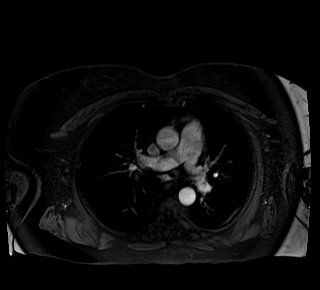

[Series 23: T1 dynamic · axial · 3.0mm · 1.38mm/px · z∈[-149,+88]mm · 3 of 80 slices shown (2 of 5)]
[im 1/80]
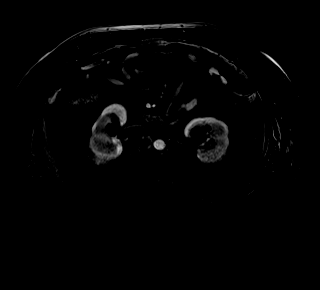
[im 40/80]
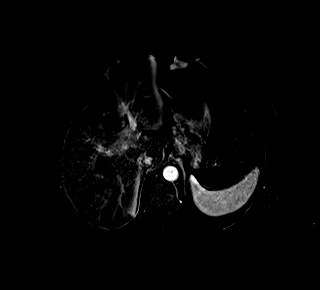
[im 80/80]
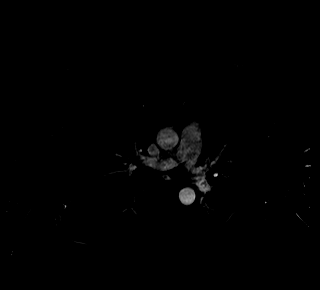

[Series 24: T1 dynamic post-contrast · axial · 3.0mm · 1.38mm/px · z∈[-149,+88]mm · 3 of 80 slices shown (2 of 5)]
[im 1/80]
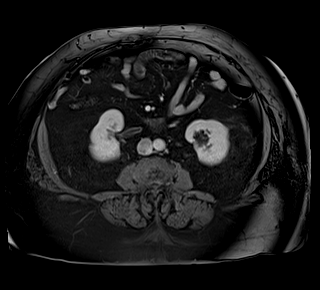
[im 40/80]
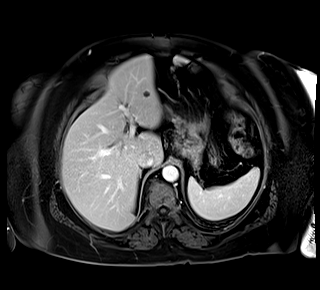
[im 80/80]
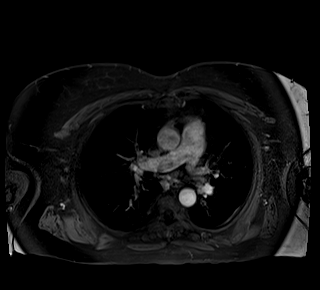

[Series 25: T1 dynamic · axial · 3.0mm · 1.38mm/px · z∈[-149,+88]mm · 3 of 80 slices shown (3 of 5)]
[im 1/80]
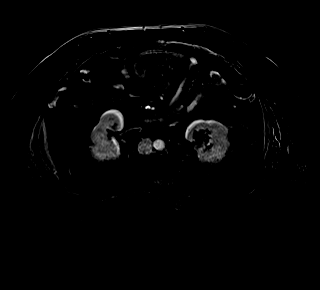
[im 40/80]
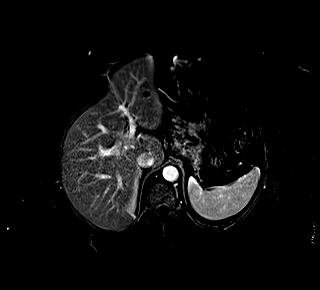
[im 80/80]
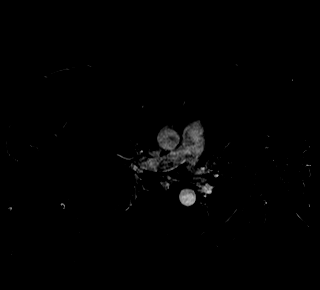

[Series 26: T1 dynamic post-contrast · axial · 3.0mm · 1.38mm/px · z∈[-149,+88]mm · 3 of 80 slices shown (3 of 5)]
[im 1/80]
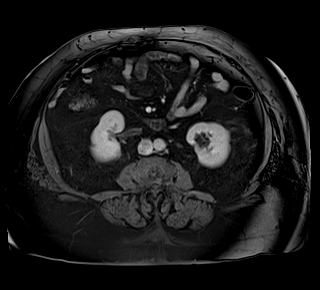
[im 40/80]
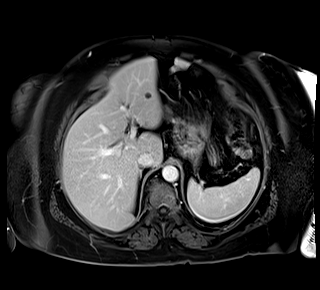
[im 80/80]
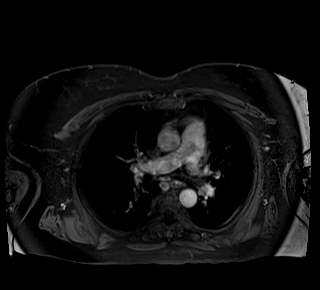

[Series 27: T1 dynamic · axial · 3.0mm · 1.38mm/px · z∈[-149,+88]mm · 3 of 80 slices shown (4 of 5)]
[im 1/80]
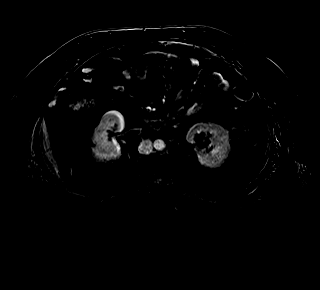
[im 40/80]
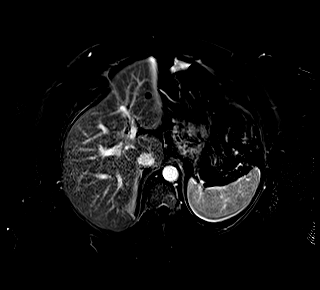
[im 80/80]
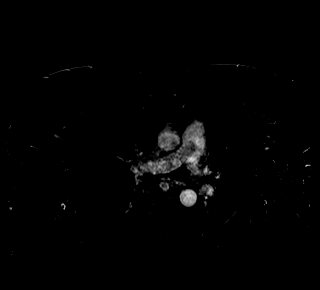

[Series 28: T1 dynamic post-contrast · axial · 3.0mm · 1.38mm/px · z∈[-149,+88]mm · 3 of 80 slices shown (4 of 5)]
[im 1/80]
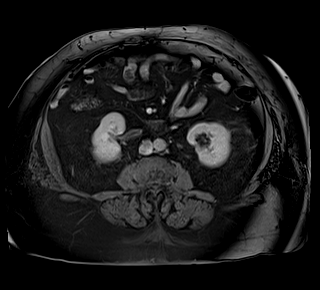
[im 40/80]
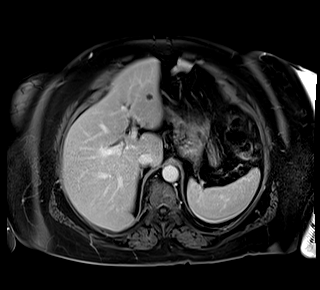
[im 80/80]
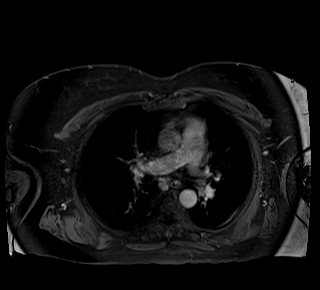

[Series 29: T1 dynamic · axial · 3.0mm · 1.38mm/px · z∈[-149,+88]mm · 3 of 80 slices shown (5 of 5)]
[im 1/80]
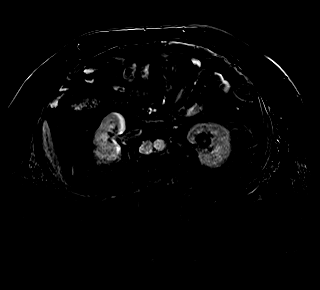
[im 40/80]
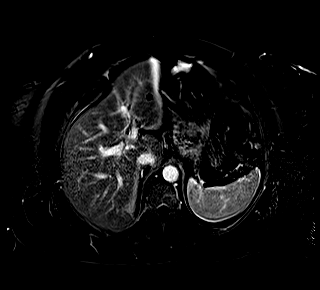
[im 80/80]
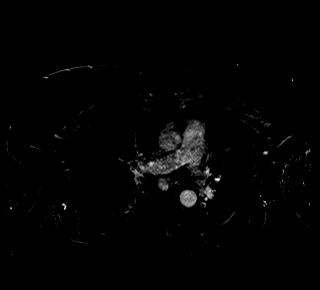

[Series 30: T1 dynamic post-contrast · coronal · 3.0mm · 1.38mm/px · 3 of 96 slices shown (5 of 5)]
[im 1/96]
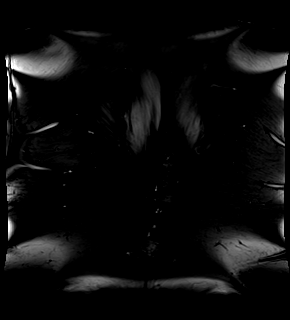
[im 48/96]
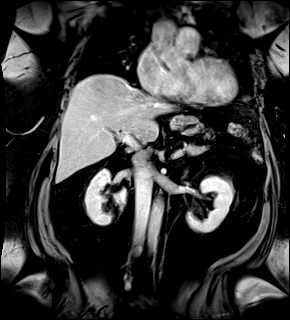
[im 96/96]
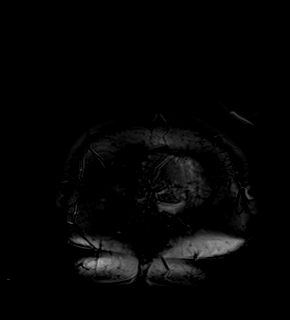

[44 of 48 positions shown; findings below may reference images not displayed]

FINDINGS: Lower chest:  Lung bases are clear.

Hepatobiliary: Multiple gallstones layer dependently within the
lumen of the gallbladder. Stones ranging in size from 3-8 mm. Stones
extend into the gallbladder neck (image 34/15). No pericholecystic
fluid. No gallbladder wall thickening. No gallbladder distension.

Common bile duct normal caliber. No filling defect within the common
bile duct.

No intrahepatic biliary duct dilatation. No pancreatic lesion other
than several small benign cysts in the LEFT hepatic lobe.

Pancreas: Normal pancreatic parenchymal intensity. No ductal
dilatation or inflammation.

Spleen: Normal spleen.

Adrenals/urinary tract: Adrenal glands and kidneys are normal.

Stomach/Bowel: Stomach and limited of the small bowel is
unremarkable

Vascular/Lymphatic: Abdominal aortic normal caliber. No
retroperitoneal periportal lymphadenopathy.

Musculoskeletal: No aggressive osseous lesion
IMPRESSION: 1. Cholelithiasis without evidence cholecystitis. Gallstones extend
into the gallbladder neck without evidence obstruction.
2. No choledocholithiasis.
3. Normal liver parenchyma.
4. Normal pancreas.

## 2019-10-07 ENCOUNTER — Ambulatory Visit (INDEPENDENT_AMBULATORY_CARE_PROVIDER_SITE_OTHER): Payer: Medicare Other

## 2019-10-07 ENCOUNTER — Encounter: Payer: Self-pay | Admitting: Podiatry

## 2019-10-07 ENCOUNTER — Other Ambulatory Visit: Payer: Self-pay

## 2019-10-07 ENCOUNTER — Ambulatory Visit (INDEPENDENT_AMBULATORY_CARE_PROVIDER_SITE_OTHER): Payer: Medicare Other | Admitting: Podiatry

## 2019-10-07 DIAGNOSIS — M47817 Spondylosis without myelopathy or radiculopathy, lumbosacral region: Secondary | ICD-10-CM | POA: Diagnosis not present

## 2019-10-07 DIAGNOSIS — M5136 Other intervertebral disc degeneration, lumbar region: Secondary | ICD-10-CM | POA: Diagnosis not present

## 2019-10-07 DIAGNOSIS — S99921A Unspecified injury of right foot, initial encounter: Secondary | ICD-10-CM | POA: Diagnosis not present

## 2019-10-07 DIAGNOSIS — S9031XA Contusion of right foot, initial encounter: Secondary | ICD-10-CM | POA: Diagnosis not present

## 2019-10-07 DIAGNOSIS — M160 Bilateral primary osteoarthritis of hip: Secondary | ICD-10-CM | POA: Diagnosis not present

## 2019-10-07 DIAGNOSIS — S92501A Displaced unspecified fracture of right lesser toe(s), initial encounter for closed fracture: Secondary | ICD-10-CM

## 2019-10-07 DIAGNOSIS — S90121A Contusion of right lesser toe(s) without damage to nail, initial encounter: Secondary | ICD-10-CM | POA: Diagnosis not present

## 2019-10-07 DIAGNOSIS — Z79891 Long term (current) use of opiate analgesic: Secondary | ICD-10-CM | POA: Diagnosis not present

## 2019-10-07 DIAGNOSIS — Z79899 Other long term (current) drug therapy: Secondary | ICD-10-CM | POA: Diagnosis not present

## 2019-10-07 DIAGNOSIS — G894 Chronic pain syndrome: Secondary | ICD-10-CM | POA: Diagnosis not present

## 2019-10-07 NOTE — Progress Notes (Signed)
  Subjective:  Patient ID: Misty [name], female    DOB: [birth year],  MRN: [id number]  Chief Complaint  Patient presents with  . Foot Injury    R 3rd and 4th toes + dorsal forefoot submet 3-4. Pt stated, "My mother's old HoverRound ran over the top of my foot on Saturday (4-5 days ago). I put ice packs on it. Elevation helps. Pain = 5/10 at rest and 8/10 when I walk".    59 y.o. female presents with the above complaint. History confirmed with patient.  No treatment for this thus far.  History of previous hammertoe and metatarsal surgery  Objective:  Physical Exam: warm, good capillary refill, no trophic changes or ulcerative lesions, normal DP and PT pulses and normal sensory exam.   Right Foot: Moderate edema over dorsal forefoot with pain on palpation over the forefoot which does not localize to a particular metatarsal, swelling and edema of the third and fourth digits with pain on palpation   Radiographs: X-ray of the right foot: Avulsion fracture of medial base of proximal phalanx of fourth toe Assessment:   1. Injury of right foot, initial encounter   2. Contusion of right foot including toes, initial encounter   3. Closed fracture of phalanx of right fourth toe, initial encounter      Plan:  Patient was evaluated and treated and all questions answered.   -XR Reviewed with patient,  -Would benefit from trial of non-operative management for this injury., -Toe splinted and stabilized by buddy taping the second third and fourth toes.  I demonstrated this for her and gave her some [place] to do at home.  -WBAT in surgical shoe and  -Surgical shoe dispensed  Return in about 3 months (around 01/07/2020) for to re-check toe fractures if painful.   Lanae Crumbly, DPM 10/07/2019

## 2019-10-08 ENCOUNTER — Other Ambulatory Visit: Payer: Self-pay | Admitting: Pain Medicine

## 2019-10-08 ENCOUNTER — Other Ambulatory Visit: Payer: Self-pay | Admitting: Podiatry

## 2019-10-08 DIAGNOSIS — M25551 Pain in right hip: Secondary | ICD-10-CM

## 2019-10-08 DIAGNOSIS — S92501A Displaced unspecified fracture of right lesser toe(s), initial encounter for closed fracture: Secondary | ICD-10-CM

## 2019-10-08 DIAGNOSIS — G8929 Other chronic pain: Secondary | ICD-10-CM

## 2019-10-08 DIAGNOSIS — M545 Low back pain, unspecified: Secondary | ICD-10-CM

## 2019-10-14 DIAGNOSIS — M1611 Unilateral primary osteoarthritis, right hip: Secondary | ICD-10-CM | POA: Diagnosis not present

## 2019-10-14 DIAGNOSIS — M25551 Pain in right hip: Secondary | ICD-10-CM | POA: Insufficient documentation

## 2019-10-22 DIAGNOSIS — M25551 Pain in right hip: Secondary | ICD-10-CM | POA: Diagnosis not present

## 2019-10-22 DIAGNOSIS — G8929 Other chronic pain: Secondary | ICD-10-CM | POA: Diagnosis not present

## 2019-10-28 DIAGNOSIS — M1611 Unilateral primary osteoarthritis, right hip: Secondary | ICD-10-CM | POA: Diagnosis not present

## 2019-11-02 DIAGNOSIS — M47817 Spondylosis without myelopathy or radiculopathy, lumbosacral region: Secondary | ICD-10-CM | POA: Diagnosis not present

## 2019-11-02 DIAGNOSIS — G894 Chronic pain syndrome: Secondary | ICD-10-CM | POA: Diagnosis not present

## 2019-11-02 DIAGNOSIS — M5136 Other intervertebral disc degeneration, lumbar region: Secondary | ICD-10-CM | POA: Diagnosis not present

## 2019-11-02 DIAGNOSIS — M48062 Spinal stenosis, lumbar region with neurogenic claudication: Secondary | ICD-10-CM | POA: Diagnosis not present

## 2019-11-05 ENCOUNTER — Other Ambulatory Visit: Payer: Medicare Other

## 2019-11-20 ENCOUNTER — Ambulatory Visit: Payer: Medicare Other

## 2019-11-22 DIAGNOSIS — G8929 Other chronic pain: Secondary | ICD-10-CM | POA: Diagnosis not present

## 2019-11-22 DIAGNOSIS — M25551 Pain in right hip: Secondary | ICD-10-CM | POA: Diagnosis not present

## 2019-11-27 ENCOUNTER — Ambulatory Visit
Admission: RE | Admit: 2019-11-27 | Discharge: 2019-11-27 | Disposition: A | Discharge status: Home / Self Care | Payer: Medicare Other | Source: Ambulatory Visit | Attending: Pain Medicine | Admitting: Pain Medicine

## 2019-11-27 ENCOUNTER — Other Ambulatory Visit: Payer: Self-pay

## 2019-11-27 DIAGNOSIS — M25551 Pain in right hip: Secondary | ICD-10-CM

## 2019-11-27 DIAGNOSIS — M48061 Spinal stenosis, lumbar region without neurogenic claudication: Secondary | ICD-10-CM | POA: Diagnosis not present

## 2019-11-27 DIAGNOSIS — G8929 Other chronic pain: Secondary | ICD-10-CM

## 2019-11-27 DIAGNOSIS — M545 Low back pain, unspecified: Secondary | ICD-10-CM

## 2019-12-07 DIAGNOSIS — M25551 Pain in right hip: Secondary | ICD-10-CM | POA: Diagnosis not present

## 2019-12-08 ENCOUNTER — Other Ambulatory Visit: Payer: Self-pay | Admitting: Internal Medicine

## 2019-12-09 DIAGNOSIS — M5136 Other intervertebral disc degeneration, lumbar region: Secondary | ICD-10-CM | POA: Diagnosis not present

## 2019-12-09 DIAGNOSIS — M79671 Pain in right foot: Secondary | ICD-10-CM | POA: Diagnosis not present

## 2019-12-09 DIAGNOSIS — M48062 Spinal stenosis, lumbar region with neurogenic claudication: Secondary | ICD-10-CM | POA: Diagnosis not present

## 2019-12-09 DIAGNOSIS — G894 Chronic pain syndrome: Secondary | ICD-10-CM | POA: Diagnosis not present

## 2019-12-11 ENCOUNTER — Ambulatory Visit (INDEPENDENT_AMBULATORY_CARE_PROVIDER_SITE_OTHER): Payer: Medicare Other

## 2019-12-11 ENCOUNTER — Telehealth: Payer: Self-pay | Admitting: Podiatry

## 2019-12-11 ENCOUNTER — Other Ambulatory Visit: Payer: Self-pay

## 2019-12-11 ENCOUNTER — Encounter: Payer: Self-pay | Admitting: Podiatry

## 2019-12-11 ENCOUNTER — Ambulatory Visit (INDEPENDENT_AMBULATORY_CARE_PROVIDER_SITE_OTHER): Payer: Medicare Other | Admitting: Podiatry

## 2019-12-11 DIAGNOSIS — S92901D Unspecified fracture of right foot, subsequent encounter for fracture with routine healing: Secondary | ICD-10-CM

## 2019-12-11 DIAGNOSIS — S99921A Unspecified injury of right foot, initial encounter: Secondary | ICD-10-CM

## 2019-12-11 DIAGNOSIS — Q828 Other specified congenital malformations of skin: Secondary | ICD-10-CM

## 2019-12-11 NOTE — Progress Notes (Signed)
This patient presents to  the office with chief complaint of continued pain noted from the callus on both feet.  She says her right foot is painful at her inside midfoot and fourth toe right foot.  She was seen by Dr.  Sherryle Lis who diagnosed right foot injury with fracture fourth toe right foot.She says she has worn her wooden shoe for 8 weeks and has now returned to her regular footgear.  She says her midfoot pain has returned.   She states that the callus becomes very painful walking and wearing her shoes. She says she has  had previous surgery in an attempt to eliminate her pain but her pain persists.  She now says that she presents the office routinely for treatment of her painful calluses on both feet and evaluation of her right foot injury.   GENERAL APPEARANCE: Alert, conversant. Appropriately groomed. No acute distress.  VASCULAR: Pedal pulses are  palpable at  [facility] and PT bilateral.  Capillary refill time is immediate to all digits,  Normal temperature gradient.   NEUROLOGIC: sensation is normal to 5.07 monofilament at 5/5 sites bilateral.  Light touch is intact bilateral, Muscle strength normal.  MUSCULOSKELETAL: acceptable muscle strength, tone and stability bilateral.  Intrinsic muscluature intact bilateral.  Rectus appearance of foot and digits noted bilateral. Heloma durum 5th toe left foot.  Painful distal clavi second toe right foot.  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  Palpable pain at the base 1st MCJ with liz-frank pain base 2,3 right foot.  No evidence of redness or swelling or increased temperature right midfoot.  Hammer toes  B/L.  DERMATOLOGIC: skin color, texture, and turgor are within normal limits.  No preulcerative lesions or ulcers  are seen, no interdigital maceration noted.  No open lesions present.  Digital nails are asymptomatic. No drainage noted.Porokeratosis sub 3,5 right  foot and 4 left foot. Pinch callus right foot.  Distal clavi second right.  Porokeratosis  B/l  Chronic  arthritis midfoot right foot.  Debridement of porokeratosis  B/L   X-ray was taken revealing arthritic changes at [place] right foot.  Dr.  Posey Pronto examined the x-ray and says the x-ray is okay.  She was dispensed a new surgical shoe to wear on right foot.  She is to return to Dr.  Britt Bottom in 4 weeks and return for porokeratosis treatment in 10 weeks.   RTC 10 weeks.    Gardiner Barefoot DPM

## 2019-12-22 DIAGNOSIS — G8929 Other chronic pain: Secondary | ICD-10-CM | POA: Diagnosis not present

## 2019-12-22 DIAGNOSIS — M25551 Pain in right hip: Secondary | ICD-10-CM | POA: Diagnosis not present

## 2020-01-04 DIAGNOSIS — M25552 Pain in left hip: Secondary | ICD-10-CM | POA: Diagnosis not present

## 2020-01-04 DIAGNOSIS — M5432 Sciatica, left side: Secondary | ICD-10-CM | POA: Diagnosis not present

## 2020-01-05 ENCOUNTER — Ambulatory Visit (INDEPENDENT_AMBULATORY_CARE_PROVIDER_SITE_OTHER): Payer: Medicare Other

## 2020-01-05 ENCOUNTER — Ambulatory Visit: Payer: Medicare Other | Admitting: Pharmacist

## 2020-01-05 ENCOUNTER — Ambulatory Visit (INDEPENDENT_AMBULATORY_CARE_PROVIDER_SITE_OTHER): Payer: Medicare Other | Admitting: Podiatry

## 2020-01-05 ENCOUNTER — Other Ambulatory Visit: Payer: Self-pay

## 2020-01-05 DIAGNOSIS — M79671 Pain in right foot: Secondary | ICD-10-CM | POA: Diagnosis not present

## 2020-01-05 DIAGNOSIS — M545 Low back pain, unspecified: Secondary | ICD-10-CM

## 2020-01-05 DIAGNOSIS — M19071 Primary osteoarthritis, right ankle and foot: Secondary | ICD-10-CM | POA: Diagnosis not present

## 2020-01-05 DIAGNOSIS — G8929 Other chronic pain: Secondary | ICD-10-CM

## 2020-01-05 DIAGNOSIS — S92901D Unspecified fracture of right foot, subsequent encounter for fracture with routine healing: Secondary | ICD-10-CM

## 2020-01-05 DIAGNOSIS — E7849 Other hyperlipidemia: Secondary | ICD-10-CM

## 2020-01-05 DIAGNOSIS — I10 Essential (primary) hypertension: Secondary | ICD-10-CM
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
## 2020-01-05 NOTE — Patient Instructions (Addendum)
Visit Information  Phone number for Pharmacist: [phone]  Goals Addressed            This Visit's Progress   . Pharmacy Care Plan       CARE PLAN ENTRY  Current Barriers:  . Chronic Disease Management support, education, and care coordination needs related to Hypertension, Hyperlipidemia, and Chronic Pain   Hypertension BP Readings from Last 3 Encounters:  09/23/19 122/70  08/25/19 112/70  06/12/19 118/64 .  Pharmacist Clinical Goal(s): o Over the next 90 days, patient will work with PharmD and providers to maintain BP goal < 130/80 . Current regimen:  o Lisinopril 20 mg daily o HCTZ 12.5 mg daily as needed for swelling or BP . Interventions: o Discussed blood pressure goals and importance of maintaining BP in optimal range 100/60 to 130/80 . Patient self care activities - Over the next 90 days, patient will: o Check BP daily, document, and provide at future appointments o Ensure daily salt intake < 2300 mg/day  Hyperlipidemia Lab Results  Component Value Date/Time   LDLCALC 144 (H) 09/23/2019 03:03 PM .  Pharmacist Clinical Goal(s): o Over the next 90 days, patient will work with PharmD and providers to achieve LDL goal < 100 . Current regimen:  o Atorvastatin 20 mg daily . Interventions: o Discussed cholesterol goals and benefits of medications for prevention of heart attack / stroke . Patient self care activities - Over the next 90 days, patient will: o Continue to exercise 3-5 days weekly, both weights and cardio o Limit high-cholesterol foods like fried foods, red meat, and eggs  Chronic Pain . Pharmacist Clinical Goal(s) o Over the next 90 days, patient will work with PharmD and providers to improve sleep and mood . Current regimen:  o Oxycodone 10 mg 5 times daily - rarely takes all 5 o Xtampza (oxycodone) ER 13.5 mg twice a day o Gabapentin 300 mg - 1 AM, 2 PM and 2 HS o Diclofenac 1% gel: 2-3 times daily  o Meloxicam 15 mg  daily . Interventions: o Discussed importance of following up with pain managements and adhering to physical therapy recommendations o Discussed importance of medication organization to prevent overdose o Discussed benefits of weight loss on joint pain . Patient self care activities - Over the next 90 days, patient will: o Use a pill box to organize oxycodone  o Keep Narcan on hand in case of accidental overdose o Follow with pain management and adhere to recommendations o Continue exercising 3-5 days per week to lose weight for hip surgery  Medication management . Pharmacist Clinical Goal(s): o Over the next 90 days, patient will work with PharmD and providers to achieve optimal medication adherence . Current pharmacy: [facility] . Interventions o Comprehensive medication review performed. o Continue current medication management strategy . Patient self care activities - Over the next 90 days, patient will: o Focus on medication adherence by pill box o Take medications as prescribed o Report any questions or concerns to PharmD and/or provider(s)  Please see past updates related to this goal by clicking on the "Past Updates" button in the selected goal        Patient verbalizes understanding of instructions provided today.  Telephone follow up appointment with pharmacy team member scheduled for: 3 months  Charlene Brooke, PharmD, BCACP Clinical Pharmacist [facility] at [facility] [phone]  Health Maintenance, Female Adopting a healthy lifestyle and getting preventive care are important in promoting health and wellness. Ask your health care  provider about:  The right schedule for you to have regular tests and exams.  Things you can do on your own to prevent diseases and keep yourself healthy. What should I know about diet, weight, and exercise? Eat a healthy diet   Eat a diet that includes plenty of vegetables, fruits, low-fat dairy products, and lean  protein.  Do not eat a lot of foods that are high in solid fats, added sugars, or sodium. Maintain a healthy weight Body mass index (BMI) is used to identify weight problems. It estimates body fat based on height and weight. Your health care provider can help determine your BMI and help you achieve or maintain a healthy weight. Get regular exercise Get regular exercise. This is one of the most important things you can do for your health. Most adults should:  Exercise for at least 150 minutes each week. The exercise should increase your heart rate and make you sweat (moderate-intensity exercise).  Do strengthening exercises at least twice a week. This is in addition to the moderate-intensity exercise.  Spend less time sitting. Even light physical activity can be beneficial. Watch cholesterol and blood lipids Have your blood tested for lipids and cholesterol at 58 years of age, then have this test every 5 years. Have your cholesterol levels checked more often if:  Your lipid or cholesterol levels are high.  You are older than 58 years of age.  You are at high risk for heart disease. What should I know about cancer screening? Depending on your health history and family history, you may need to have cancer screening at various ages. This may include screening for:  Breast cancer.  Cervical cancer.  Colorectal cancer.  Skin cancer.  Lung cancer. What should I know about heart disease, diabetes, and high blood pressure? Blood pressure and heart disease  High blood pressure causes heart disease and increases the risk of stroke. This is more likely to develop in people who have high blood pressure readings, are of African descent, or are overweight.  Have your blood pressure checked: ? Every 3-5 years if you are 48-60 years of age. ? Every year if you are 105 years old or older. Diabetes Have regular diabetes screenings. This checks your fasting blood sugar level. Have the screening  done:  Once every three years after age 31 if you are at a normal weight and have a low risk for diabetes.  More often and at a younger age if you are overweight or have a high risk for diabetes. What should I know about preventing infection? Hepatitis B If you have a higher risk for hepatitis B, you should be screened for this virus. Talk with your health care provider to find out if you are at risk for hepatitis B infection. Hepatitis C Testing is recommended for:  Everyone born from [birth year] through 1964.  Anyone with known risk factors for hepatitis C. Sexually transmitted infections (STIs)  Get screened for STIs, including gonorrhea and chlamydia, if: ? You are sexually active and are younger than 58 years of age. ? You are older than 58 years of age and your health care provider tells you that you are at risk for this type of infection. ? Your sexual activity has changed since you were last screened, and you are at increased risk for chlamydia or gonorrhea. Ask your health care provider if you are at risk.  Ask your health care provider about whether you are at high risk for HIV. Your health  care provider may recommend a prescription medicine to help prevent HIV infection. If you choose to take medicine to prevent HIV, you should first get tested for HIV. You should then be tested every 3 months for as long as you are taking the medicine. Pregnancy  If you are about to stop having your period (premenopausal) and you may become pregnant, seek counseling before you get pregnant.  Take 400 to 800 micrograms (mcg) of folic acid every day if you become pregnant.  Ask for birth control (contraception) if you want to prevent pregnancy. Osteoporosis and menopause Osteoporosis is a disease in which the bones lose minerals and strength with aging. This can result in bone fractures. If you are 47 years old or older, or if you are at risk for osteoporosis and fractures, ask your health care  provider if you should:  Be screened for bone loss.  Take a calcium or vitamin D supplement to lower your risk of fractures.  Be given hormone replacement therapy (HRT) to treat symptoms of menopause. Follow these instructions at home: Lifestyle  Do not use any products that contain nicotine or tobacco, such as cigarettes, e-cigarettes, and chewing tobacco. If you need help quitting, ask your health care provider.  Do not use street drugs.  Do not share needles.  Ask your health care provider for help if you need support or information about quitting drugs. Alcohol use  Do not drink alcohol if: ? Your health care provider tells you not to drink. ? You are pregnant, may be pregnant, or are planning to become pregnant.  If you drink alcohol: ? Limit how much you use to 0-1 drink a day. ? Limit intake if you are breastfeeding.  Be aware of how much alcohol is in your drink. In the [place] one drink equals one 12 oz bottle of beer (355 mL), one 5 oz glass of wine (148 mL), or one 1 oz glass of hard liquor (44 mL). General instructions  Schedule regular health, dental, and eye exams.  Stay current with your vaccines.  Tell your health care provider if: ? You often feel depressed. ? You have ever been abused or do not feel safe at home. Summary  Adopting a healthy lifestyle and getting preventive care are important in promoting health and wellness.  Follow your health care provider's instructions about healthy diet, exercising, and getting tested or screened for diseases.  Follow your health care provider's instructions on monitoring your cholesterol and blood pressure. This information is not intended to replace advice given to you by your health care provider. Make sure you discuss any questions you have with your health care provider. Document Revised: 02/26/2018 Document Reviewed: 02/26/2018 Elsevier Patient Education  2020 [employer].

## 2020-01-06 NOTE — Progress Notes (Signed)
  Subjective:  Patient ID: Misty [name], female    DOB: Sep 05, 1960,  MRN: [id number]  Chief Complaint  Patient presents with  . Foot Pain    right foot     59 y.o. female returns with the above complaint. History confirmed with patient.  The fourth toe is feeling better.  She still wearing the surgical shoe.  She now complains of pain in the medial and dorsal side of the first metatarsophalangeal joint with motion.  Objective:  Physical Exam: warm, good capillary refill, no trophic changes or ulcerative lesions, normal DP and PT pulses and normal sensory exam.   Right Foot: No pain on palpation or range of motion of the fourth digit, she does have pain with range of motion of the first MTP joint with localized edema and swelling   Radiographs: X-ray of the right foot: Avulsion fracture of medial base of proximal phalanx of fourth toe has mostly healed, it is in a slightly rotated position.  There is moderate to severe osteoarthritis of the first metatarsal phalangeal joint Assessment:   1. Foot pain, right   2. Osteoarthritis of first metatarsophalangeal (MTP) joint of right foot   3. Closed fracture dislocation of right foot with routine healing, subsequent encounter      Plan:  Patient was evaluated and treated and all questions answered.   -XR reviewed with patient regarding both the arthritis as well as the toe fracture -The toe fracture she may DC the surgical shoe as soon as she is comfortable  -Regarding the arthritis in the first metatarsophalangeal joint and discussed etiology treatments for this including injection therapy, stiffer shoes and surgical invention with joint resection arthroplasty as well as joint fusion.  Today I recommended an injection.  Following sterile prep with Betadine, 5 mg of Kenalog, 2 mg of dexamethasone and 1/2 cc of 2% Xylocaine plain was infiltrated into the first metatarsophalangeal joint.  She tolerated the procedure well and was covered  with Band-Aid.  Return in about 2 months (around 03/06/2020).   Lanae Crumbly, DPM 01/06/2020

## 2020-01-07 ENCOUNTER — Ambulatory Visit (INDEPENDENT_AMBULATORY_CARE_PROVIDER_SITE_OTHER): Payer: Medicare Other

## 2020-01-07 ENCOUNTER — Other Ambulatory Visit: Payer: Self-pay

## 2020-01-07 DIAGNOSIS — Z23 Encounter for immunization: Secondary | ICD-10-CM

## 2020-01-07 DIAGNOSIS — E538 Deficiency of other specified B group vitamins: Secondary | ICD-10-CM

## 2020-01-07 MED ORDER — CYANOCOBALAMIN 1000 MCG/ML IJ SOLN
1000.0000 ug | INTRAMUSCULAR | Status: DC
  Administered 2020-01-07: 1000 ug via INTRAMUSCULAR

## 2020-01-07 NOTE — Progress Notes (Addendum)
Pt here for monthly B12 injection per Dr Quay Burow.  B12 1028mcg given IM left deltoid and pt tolerated injection well.      b12 Injection given.   [name], MD

## 2020-01-08 ENCOUNTER — Ambulatory Visit: Payer: Medicare Other | Admitting: Podiatry

## 2020-01-08 DIAGNOSIS — M47817 Spondylosis without myelopathy or radiculopathy, lumbosacral region: Secondary | ICD-10-CM | POA: Diagnosis not present

## 2020-01-08 DIAGNOSIS — M79604 Pain in right leg: Secondary | ICD-10-CM | POA: Diagnosis not present

## 2020-01-08 DIAGNOSIS — Z79899 Other long term (current) drug therapy: Secondary | ICD-10-CM | POA: Diagnosis not present

## 2020-01-08 DIAGNOSIS — M5136 Other intervertebral disc degeneration, lumbar region: Secondary | ICD-10-CM | POA: Diagnosis not present

## 2020-01-08 DIAGNOSIS — Z79891 Long term (current) use of opiate analgesic: Secondary | ICD-10-CM | POA: Diagnosis not present

## 2020-01-08 DIAGNOSIS — G894 Chronic pain syndrome: Secondary | ICD-10-CM | POA: Diagnosis not present

## 2020-01-12 DIAGNOSIS — M5432 Sciatica, left side: Secondary | ICD-10-CM | POA: Diagnosis not present

## 2020-01-14 DIAGNOSIS — Z1159 Encounter for screening for other viral diseases: Secondary | ICD-10-CM | POA: Diagnosis not present

## 2020-01-19 DIAGNOSIS — Z85038 Personal history of other malignant neoplasm of large intestine: Secondary | ICD-10-CM | POA: Diagnosis not present

## 2020-01-19 LAB — HM COLONOSCOPY

## 2020-01-22 DIAGNOSIS — G8929 Other chronic pain: Secondary | ICD-10-CM | POA: Diagnosis not present

## 2020-01-22 DIAGNOSIS — M25551 Pain in right hip: Secondary | ICD-10-CM | POA: Diagnosis not present

## 2020-01-25 DIAGNOSIS — M47817 Spondylosis without myelopathy or radiculopathy, lumbosacral region: Secondary | ICD-10-CM | POA: Diagnosis not present

## 2020-01-25 DIAGNOSIS — M5136 Other intervertebral disc degeneration, lumbar region: Secondary | ICD-10-CM | POA: Diagnosis not present

## 2020-01-27 ENCOUNTER — Other Ambulatory Visit: Payer: Self-pay | Admitting: Pain Medicine

## 2020-01-27 DIAGNOSIS — M25552 Pain in left hip: Secondary | ICD-10-CM

## 2020-01-27 DIAGNOSIS — R1032 Left lower quadrant pain: Secondary | ICD-10-CM

## 2020-01-28 ENCOUNTER — Telehealth: Payer: Self-pay

## 2020-01-28 ENCOUNTER — Other Ambulatory Visit: Payer: Self-pay | Admitting: Orthopedic Surgery

## 2020-01-28 DIAGNOSIS — M48061 Spinal stenosis, lumbar region without neurogenic claudication: Secondary | ICD-10-CM | POA: Diagnosis not present

## 2020-01-28 DIAGNOSIS — M25552 Pain in left hip: Secondary | ICD-10-CM | POA: Diagnosis not present

## 2020-01-28 NOTE — Telephone Encounter (Signed)
Spoke with patient to review her medications and drug allergies before getting her scheduled for a lumbar myelogram.  She stated an understanding she will be here two hours, will need a driver and will need to be on strict bedrest (explained) for 24 hours after the procedure.

## 2020-02-04 ENCOUNTER — Ambulatory Visit
Admission: RE | Admit: 2020-02-04 | Discharge: 2020-02-04 | Disposition: A | Discharge status: Home / Self Care | Payer: Medicare Other | Source: Ambulatory Visit | Attending: Orthopedic Surgery | Admitting: Orthopedic Surgery

## 2020-02-04 DIAGNOSIS — M48061 Spinal stenosis, lumbar region without neurogenic claudication: Secondary | ICD-10-CM

## 2020-02-04 DIAGNOSIS — M5126 Other intervertebral disc displacement, lumbar region: Secondary | ICD-10-CM | POA: Diagnosis not present

## 2020-02-04 MED ORDER — DIAZEPAM 5 MG PO TABS
10.0000 mg | ORAL_TABLET | Freq: Once | ORAL | Status: AC
  Administered 2020-02-04: 5 mg via ORAL

## 2020-02-04 MED ORDER — IOPAMIDOL (ISOVUE-M 200) INJECTION 41%
18.0000 mL | Freq: Once | INTRAMUSCULAR | Status: AC
  Administered 2020-02-04: 18 mL via INTRATHECAL

## 2020-02-04 MED ORDER — ONDANSETRON HCL 4 MG/2ML IJ SOLN: 4.0000 mg | Freq: Four times a day (QID) | INTRAMUSCULAR | Status: DC | PRN

## 2020-02-04 NOTE — Discharge Instructions (Signed)
Myelogram Discharge Instructions  1. Go home and rest quietly for the next 24 hours.  It is important to lie flat for the next 24 hours.  Get up only to go to the restroom.  You may lie in the bed or on a couch on your back, your stomach, your left side or your right side.  You may have one pillow under your head.  You may have pillows between your knees while you are on your side or under your knees while you are on your back.  2. DO NOT drive today.  Recline the seat as far back as it will go, while still wearing your seat belt, on the way home.  3. You may get up to go to the bathroom as needed.  You may sit up for 15-20 minutes to eat.  You may resume your normal diet and medications unless otherwise indicated.  Drink plenty of extra fluids today and tomorrow.  4. The incidence of a spinal headache with nausea and/or vomiting is about 5% (one in 20 patients).  If you develop a headache, lie flat and drink plenty of fluids until the headache goes away.  Caffeinated beverages may be helpful.  If you develop severe nausea and vomiting or a headache that does not go away with flat bed rest, call [phone].  5. You may resume normal activities after your 24 hours of bed rest is over; however, do not exert yourself strongly or do any heavy lifting tomorrow.  6. Call your physician for a follow-up appointment.  

## 2020-02-08 DIAGNOSIS — M160 Bilateral primary osteoarthritis of hip: Secondary | ICD-10-CM | POA: Diagnosis not present

## 2020-02-08 DIAGNOSIS — M25552 Pain in left hip: Secondary | ICD-10-CM | POA: Diagnosis not present

## 2020-02-08 DIAGNOSIS — M48061 Spinal stenosis, lumbar region without neurogenic claudication: Secondary | ICD-10-CM | POA: Diagnosis not present

## 2020-02-08 DIAGNOSIS — M47817 Spondylosis without myelopathy or radiculopathy, lumbosacral region: Secondary | ICD-10-CM | POA: Diagnosis not present

## 2020-02-08 DIAGNOSIS — G894 Chronic pain syndrome: Secondary | ICD-10-CM | POA: Diagnosis not present

## 2020-02-08 DIAGNOSIS — M5136 Other intervertebral disc degeneration, lumbar region: Secondary | ICD-10-CM | POA: Diagnosis not present

## 2020-02-09 DIAGNOSIS — M160 Bilateral primary osteoarthritis of hip: Secondary | ICD-10-CM | POA: Diagnosis not present

## 2020-02-16 ENCOUNTER — Ambulatory Visit
Admission: RE | Admit: 2020-02-16 | Discharge: 2020-02-16 | Disposition: A | Discharge status: Home / Self Care | Payer: Medicare Other | Source: Ambulatory Visit | Attending: Pain Medicine | Admitting: Pain Medicine

## 2020-02-16 ENCOUNTER — Other Ambulatory Visit: Payer: Self-pay

## 2020-02-16 DIAGNOSIS — M6258 Muscle wasting and atrophy, not elsewhere classified, other site: Secondary | ICD-10-CM | POA: Diagnosis not present

## 2020-02-16 DIAGNOSIS — M25452 Effusion, left hip: Secondary | ICD-10-CM | POA: Diagnosis not present

## 2020-02-16 DIAGNOSIS — R1032 Left lower quadrant pain: Secondary | ICD-10-CM

## 2020-02-16 DIAGNOSIS — M1612 Unilateral primary osteoarthritis, left hip: Secondary | ICD-10-CM | POA: Diagnosis not present

## 2020-02-16 DIAGNOSIS — M87852 Other osteonecrosis, left femur: Secondary | ICD-10-CM | POA: Diagnosis not present

## 2020-02-16 DIAGNOSIS — M25552 Pain in left hip: Secondary | ICD-10-CM

## 2020-02-17 DIAGNOSIS — M47817 Spondylosis without myelopathy or radiculopathy, lumbosacral region: Secondary | ICD-10-CM | POA: Diagnosis not present

## 2020-02-17 DIAGNOSIS — M160 Bilateral primary osteoarthritis of hip: Secondary | ICD-10-CM | POA: Diagnosis not present

## 2020-02-17 DIAGNOSIS — M5136 Other intervertebral disc degeneration, lumbar region: Secondary | ICD-10-CM | POA: Diagnosis not present

## 2020-02-17 DIAGNOSIS — G894 Chronic pain syndrome: Secondary | ICD-10-CM | POA: Diagnosis not present

## 2020-02-19 ENCOUNTER — Other Ambulatory Visit: Payer: Self-pay

## 2020-02-19 ENCOUNTER — Encounter: Payer: Self-pay | Admitting: Internal Medicine

## 2020-02-19 ENCOUNTER — Ambulatory Visit (INDEPENDENT_AMBULATORY_CARE_PROVIDER_SITE_OTHER): Payer: Medicare Other | Admitting: Internal Medicine

## 2020-02-19 VITALS — BP 130/72 | HR 100 | Temp 98.3°F | Ht 64.0 in | Wt 250.6 lb

## 2020-02-19 DIAGNOSIS — E538 Deficiency of other specified B group vitamins: Secondary | ICD-10-CM

## 2020-02-19 DIAGNOSIS — I10 Essential (primary) hypertension: Secondary | ICD-10-CM

## 2020-02-19 DIAGNOSIS — M1612 Unilateral primary osteoarthritis, left hip: Secondary | ICD-10-CM | POA: Diagnosis not present

## 2020-02-19 DIAGNOSIS — M25552 Pain in left hip: Secondary | ICD-10-CM | POA: Diagnosis not present

## 2020-02-19 MED ORDER — CYANOCOBALAMIN 1000 MCG/ML IJ SOLN
1000.0000 ug | Freq: Once | INTRAMUSCULAR | Status: AC
  Administered 2020-02-19: 1000 ug via INTRAMUSCULAR

## 2020-02-19 NOTE — Patient Instructions (Addendum)
We will send the equipment to [facility].   Keep working on weight loss - you can do it.    B12 injection given today.

## 2020-02-19 NOTE — Assessment & Plan Note (Signed)
Chronic Deficiency secondary to gastric bypass Monthly B12 injections We will give an injection 1000 mcg IM today

## 2020-02-19 NOTE — Assessment & Plan Note (Signed)
Chronic BP well controlled Continue lisinopril 20 mg daily   

## 2020-02-19 NOTE — Telephone Encounter (Signed)
error 

## 2020-02-19 NOTE — Assessment & Plan Note (Signed)
Chronic Severe nature-only treatment at this point is total hip replacement Needs to lose 15 pounds before she can have surgery-she is currently doing a diet and is already lost 25 pounds-just 15 more to go.  She will continue doing what she is doing Would benefit from an elevated toilet seat and a reacher-we will order these DME products for her

## 2020-02-19 NOTE — Progress Notes (Signed)
Subjective:    Patient ID: Misty [name], female    DOB: 17-Jul-1960, 59 y.o.   MRN: [id number]  HPI The patient is here for an acute visit.  Sh walks with a rollator.   She is following with Dr Gladstone Lighter.  She was told today she needs a L THR.  He does not think she has any other options.    She is ready to have surgery.    She needs to lose 15 more lbs before surgery. She is doing omnitrition.  She has lost 25 pounds.  She has difficulty getting up from the toilet.  She needs an elevated toilet seat and a reacher.  B12 deficiency: She has not been coming in regularly for her B12 injection and needs to get that today.  Medications and allergies reviewed with patient and updated if appropriate.  Patient Active Problem List   Diagnosis Date Noted  . COVID-19 virus infection 04/18/2019  . Urinary frequency 03/10/2019  . Restless leg syndrome 03/10/2019  . Hyperlipidemia 11/27/2018  . Anemia 11/25/2018  . Nausea 09/17/2018  . Right groin pain 07/16/2018  . Depression 03/03/2018  . Right shoulder pain 03/03/2018  . Pancreatitis 02/22/2018  . Alcohol abuse 02/22/2018  . Cholelithiasis 02/22/2018  . Adhesive capsulitis of right shoulder 01/20/2018  . Ventral hernia 09/16/2017  . Greater trochanteric bursitis of right hip 04/30/2017  . Prediabetes 04/16/2017  . Numbness 01/29/2017  . Chronic low back pain 07/20/2016  . Unilateral primary osteoarthritis, right hip 07/20/2016  . Spinal stenosis of lumbar region 11/29/2015  . Positive urine drug screen 11/16/2015  . Chronic pain syndrome 10/12/2015  . Umbilical hernia 03/54/6568  . Sleeping difficulties 07/15/2015  . Bunion, right 06/20/2015  . Callus of foot 04/27/2015  . Muscle spasm 04/16/2015  . B12 deficiency 03/30/2015  . Hammer toe of left foot 02/16/2015  . Fibrosis of skin of lower extremity 02/16/2015  . Cervical spondylosis with radiculopathy 03/01/2014  . Hammer toe of right foot 01/26/2014  . Benign  intracranial hypertension 06/18/2013  . Cephalalgia 04/21/2013  . Porokeratosis 03/31/2013  . Vitamin D deficiency 04/18/2010  . MEDIAL MENISCUS TEAR, LEFT 06/30/2008  . BARRETTS ESOPHAGUS 06/28/2008  . Knee osteoarthritis 06/28/2008  . OBESITY 05/24/2008  . HYPERTENSION, BENIGN ESSENTIAL 05/24/2008  . Irritable bowel syndrome 05/24/2008  . BARIATRIC SURGERY STATUS 03/19/2006  . History of malignant neoplasm of large intestine 03/19/1997    Current Outpatient Medications on File Prior to Visit  Medication Sig Dispense Refill  . aspirin EC 81 MG tablet Take 81 mg by mouth daily.    Marland Kitchen atorvastatin (LIPITOR) 20 MG tablet Take 1 tablet (20 mg total) by mouth daily. 90 tablet 1  . bisacodyl (DULCOLAX) 5 MG EC tablet SMARTSIG:By Mouth    . cyanocobalamin (,VITAMIN B-12,) 1000 MCG/ML injection Inject 1,000 mcg into the muscle once. Every 30 days, first dose on Thursday, 01/07/20 at 1615.    . diclofenac Sodium (VOLTAREN) 1 % GEL APPLY 2 GRAMS TOPICALLY 4 TIMES DAILY 100 g 0  . docusate sodium (COLACE) 100 MG capsule Take 1 capsule (100 mg total) by mouth 2 (two) times daily as needed for mild constipation. 60 capsule 5  . gabapentin (NEURONTIN) 300 MG capsule TAKE 2 CAP BY MOUTH IN THE MORNING AND 2 CAPS IN THE AFTERNOON AND 3 CAPSULES EVERY DAY AT BEDTIME. 210 capsule 5  . lisinopril (ZESTRIL) 20 MG tablet Take 1 tablet (20 mg total) by mouth daily. 90 tablet 1  .  meloxicam (MOBIC) 15 MG tablet TAKE 1 TABLET BY MOUTH ONCE DAILY WITH FOOD 90 tablet 1  . NARCAN 4 MG/0.1ML LIQD nasal spray kit SPRAY 0.1 ML ($Remov'4MG'NvJhnM$ ) IN 1 NOSTRIL BY INTRANASAL ROUTE. MAY REPEAT DOSE EVERY 2 TO 3 MINUTES AS NEEDED ALTERNATING NOSTRILS WITH EACH DOSE    . ondansetron (ZOFRAN) 4 MG tablet TAKE 1 TABLET BY MOUTH EVERY 6 HOURS AS NEEDED FOR NAUSEA 20 tablet 0  . oxyCODONE ER (XTAMPZA ER) 13.5 MG C12A Xtampza ER 13.5 mg capsule sprinkle  TAKE 1 CAPSULE BY MOUTH EVERY 12 HOURS    . Oxycodone HCl 10 MG TABS Take 10 mg by  mouth 5 (five) times daily.     . Syringe/Needle, Disp, (SYRINGE 3CC/25GX1") 25G X 1" 3 ML MISC Use for monthly B12 injections 3 each 3  . triamcinolone cream (KENALOG) 0.5 % triamcinolone acetonide 0.5 % topical cream  APPLY CREAM TOPICALLY TO AFFECTED AREA THREE TIMES DAILY AS NEEDED    . VENTOLIN HFA 108 (90 Base) MCG/ACT inhaler Inhale 1-2 puffs into the lungs every 4 (four) hours as needed for wheezing or shortness of breath. 6.7 g 5   No current facility-administered medications on file prior to visit.    Past Medical History:  Diagnosis Date  . Allergy   . Arthritis   . Back pain   . Colon cancer ([place])   . Depression   . Diarrhea   . GERD (gastroesophageal reflux disease)   . Hypertension   . Insomnia   . MS (multiple sclerosis) ([place])   . Other hammer toe (acquired) 03/31/2013  . Pneumonia    hx of  . Umbilical hernia     Past Surgical History:  Procedure Laterality Date  . ABDOMINAL HYSTERECTOMY    . ANTERIOR CERVICAL DECOMP/DISCECTOMY FUSION N/A 03/01/2014   Procedure: ANTERIOR CERVICAL DECOMPRESSION/DISCECTOMY FUSION 1 LEVEL;  Surgeon: Charlie Pitter, MD;  Location: [facility];  Service: Neurosurgery;  Laterality: N/A;  ANTERIOR CERVICAL DECOMPRESSION/DISCECTOMY FUSION 1 LEVEL  . BUNIONECTOMY Right 10/14/2014   '@[facility]'$   . CESAREAN SECTION    . COLON SURGERY    . COLONOSCOPY    . FOOT SURGERY Right   . GASTRIC BYPASS    . Hammer Toe Repair Right 10/14/2014   RT #2, $Remo'@[facility]'wJNYh$   . TENOTOMY Right 10/14/2014   RT #3, $Remo'@[facility]'FzyUx$     Social History   Socioeconomic History  . Marital status: Single    Spouse name: Not on file  . Number of children: 2  . Years of education: Not on file  . Highest education level: Not on file  Occupational History  . Occupation: Disability  Tobacco Use  . Smoking status: Former Smoker    Packs/day: 0.25    Years: 20.00    Pack years: 5.00    Types: Cigarettes    Quit date: 09/22/2018    Years since quitting: 1.4  . Smokeless tobacco: Never  Used  . Tobacco comment: states she quit 3 months ago  Vaping Use  . Vaping Use: Never used  Substance and Sexual Activity  . Alcohol use: Yes    Alcohol/week: 0.0 standard drinks    Comment: 1/2 pint daily  . Drug use: No  . Sexual activity: Yes    Partners: Male  Other Topics Concern  . Not on file  Social History Narrative  . Not on file   Social Determinants of Health   Financial Resource Strain: Low Risk   . Difficulty of Paying Living  Expenses: Not hard at all  Food Insecurity:   . Worried About Charity fundraiser in the Last Year: Not on file  . Ran Out of Food in the Last Year: Not on file  Transportation Needs:   . Lack of Transportation (Medical): Not on file  . Lack of Transportation (Non-Medical): Not on file  Physical Activity:   . Days of Exercise per Week: Not on file  . Minutes of Exercise per Session: Not on file  Stress:   . Feeling of Stress : Not on file  Social Connections:   . Frequency of Communication with Friends and Family: Not on file  . Frequency of Social Gatherings with Friends and Family: Not on file  . Attends Religious Services: Not on file  . Active Member of Clubs or Organizations: Not on file  . Attends Archivist Meetings: Not on file  . Marital Status: Not on file    Family History  Problem Relation Age of Onset  . Stroke Mother   . Hypertension Mother   . Hyperlipidemia Mother   . Diabetes Mother   . Diabetes Brother   . Hypertension Brother   . Hypertension Brother   . Hypertension Brother   . Hypertension Brother   . Hypertension Brother   . Alcoholism Brother     Review of Systems     Objective:   Vitals:   02/19/20 1344  BP: 130/72  Pulse: 100  Temp: 98.3 F (36.8 C)  SpO2: 99%   BP Readings from Last 3 Encounters:  02/19/20 130/72  02/04/20 (!) 154/80  09/23/19 122/70   Wt Readings from Last 3 Encounters:  02/19/20 250 lb 9.6 oz (113.7 kg)  02/04/20 239 lb (108.4 kg)  09/23/19 264 lb  (119.7 kg)   Body mass index is 43.02 kg/m.   Physical Exam       MR HIP LEFT WO CONTRAST CLINICAL DATA:  Acute severe left hip pain.  EXAM: MR OF THE LEFT HIP WITHOUT CONTRAST  TECHNIQUE: Multiplanar, multisequence MR imaging was performed. No intravenous contrast was administered.  COMPARISON:  Pelvis and right hip x-rays dated Aug 13, 2018.  FINDINGS: Bones: Subchondral signal abnormality in the left femoral head with collapse and flattening superiorly, consistent with avascular necrosis. Prominent reactive marrow edema in the left femoral head and neck. Signal changes in the medial right femoral head consistent with chronic avascular necrosis, without surrounding marrow edema. No focal bone lesion. The visualized sacroiliac joints and symphysis pubis appear normal.  Articular cartilage and labrum  Articular cartilage: Extensive full-thickness cartilage loss in the left hip joint with subchondral cysts and marrow edema in the acetabulum. Scattered partial and full-thickness cartilage loss in the right hip joint with subchondral cysts in the acetabulum.  Labrum: Degenerative tearing of the bilateral anterior superior labrum.  Joint or bursal effusion  Joint effusion: Small left hip joint effusion.  Bursae: No focal periarticular fluid collection.  Muscles and tendons  Muscles and tendons: The visualized gluteus, hamstring and iliopsoas tendons are intact. Mild left greater than right hamstring origin tendinosis. Focal edema in the proximal and lateral aspects of the left gluteus maximus muscle. Mild left gluteus minimus and medius muscle atrophy.  Other findings  Miscellaneous: Prior hysterectomy.  IMPRESSION: 1. Left femoral head avascular necrosis with subchondral collapse/flattening and prominent reactive marrow edema in the femoral head/neck. 2. Chronic avascular necrosis of the right femoral head without collapse. 3. Severe left and moderate  right hip osteoarthritis. 4.  Focal edema in the proximal and lateral aspects of the left gluteus maximus muscle, which may reflect strain.  Electronically Signed   By: Titus Dubin M.D.   On: 02/16/2020 12:39    Assessment & Plan:    See Problem List for Assessment and Plan of chronic medical problems.    This visit occurred during the SARS-CoV-2 public health emergency.  Safety protocols were in place, including screening questions prior to the visit, additional usage of staff PPE, and extensive cleaning of exam room while observing appropriate contact time as indicated for disinfecting solutions.

## 2020-02-21 DIAGNOSIS — M25551 Pain in right hip: Secondary | ICD-10-CM | POA: Diagnosis not present

## 2020-02-21 DIAGNOSIS — G8929 Other chronic pain: Secondary | ICD-10-CM | POA: Diagnosis not present

## 2020-02-23 ENCOUNTER — Other Ambulatory Visit: Payer: Medicare Other

## 2020-02-23 DIAGNOSIS — M1612 Unilateral primary osteoarthritis, left hip: Secondary | ICD-10-CM | POA: Diagnosis not present

## 2020-03-02 ENCOUNTER — Telehealth: Payer: Self-pay | Admitting: Pharmacist

## 2020-03-02 NOTE — Progress Notes (Signed)
Chronic Care Management Pharmacy Assistant   Name: Misty [name]  MRN: [id number] DOB: May 28, 1960  Reason for Encounter: General Adherence Call   PCP : [name], MD  Allergies:   Allergies  Allergen Reactions  . Phenergan [Promethazine Hcl] Anxiety  . Trazodone And Nefazodone Rash    Medications: Outpatient Encounter Medications as of 03/02/2020  Medication Sig  . aspirin EC 81 MG tablet Take 81 mg by mouth daily.  Marland Kitchen atorvastatin (LIPITOR) 20 MG tablet Take 1 tablet (20 mg total) by mouth daily.  . bisacodyl (DULCOLAX) 5 MG EC tablet SMARTSIG:By Mouth  . cyanocobalamin (,VITAMIN B-12,) 1000 MCG/ML injection Inject 1,000 mcg into the muscle once. Every 30 days, first dose on Thursday, 01/07/20 at 1615.  . diclofenac Sodium (VOLTAREN) 1 % GEL APPLY 2 GRAMS TOPICALLY 4 TIMES DAILY  . docusate sodium (COLACE) 100 MG capsule Take 1 capsule (100 mg total) by mouth 2 (two) times daily as needed for mild constipation.  . gabapentin (NEURONTIN) 300 MG capsule TAKE 2 CAP BY MOUTH IN THE MORNING AND 2 CAPS IN THE AFTERNOON AND 3 CAPSULES EVERY DAY AT BEDTIME.  Marland Kitchen lisinopril (ZESTRIL) 20 MG tablet Take 1 tablet (20 mg total) by mouth daily.  . meloxicam (MOBIC) 15 MG tablet TAKE 1 TABLET BY MOUTH ONCE DAILY WITH FOOD  . NARCAN 4 MG/0.1ML LIQD nasal spray kit SPRAY 0.1 ML (4MG) IN 1 NOSTRIL BY INTRANASAL ROUTE. MAY REPEAT DOSE EVERY 2 TO 3 MINUTES AS NEEDED ALTERNATING NOSTRILS WITH EACH DOSE  . ondansetron (ZOFRAN) 4 MG tablet TAKE 1 TABLET BY MOUTH EVERY 6 HOURS AS NEEDED FOR NAUSEA  . oxyCODONE ER (XTAMPZA ER) 13.5 MG C12A Xtampza ER 13.5 mg capsule sprinkle  TAKE 1 CAPSULE BY MOUTH EVERY 12 HOURS  . Oxycodone HCl 10 MG TABS Take 10 mg by mouth 5 (five) times daily.   . Syringe/Needle, Disp, (SYRINGE 3CC/25GX1") 25G X 1" 3 ML MISC Use for monthly B12 injections  . triamcinolone cream (KENALOG) 0.5 % triamcinolone acetonide 0.5 % topical cream  APPLY CREAM TOPICALLY TO AFFECTED  AREA THREE TIMES DAILY AS NEEDED  . VENTOLIN HFA 108 (90 Base) MCG/ACT inhaler Inhale 1-2 puffs into the lungs every 4 (four) hours as needed for wheezing or shortness of breath.   No facility-administered encounter medications on file as of 03/02/2020.    Current Diagnosis: Patient Active Problem List   Diagnosis Date Noted  . Osteoarthritis of left hip 02/19/2020  . COVID-19 virus infection 04/18/2019  . Urinary frequency 03/10/2019  . Restless leg syndrome 03/10/2019  . Hyperlipidemia 11/27/2018  . Anemia 11/25/2018  . Nausea 09/17/2018  . Right groin pain 07/16/2018  . Depression 03/03/2018  . Right shoulder pain 03/03/2018  . Pancreatitis 02/22/2018  . Alcohol abuse 02/22/2018  . Cholelithiasis 02/22/2018  . Adhesive capsulitis of right shoulder 01/20/2018  . Ventral hernia 09/16/2017  . Greater trochanteric bursitis of right hip 04/30/2017  . Prediabetes 04/16/2017  . Numbness 01/29/2017  . Chronic low back pain 07/20/2016  . Unilateral primary osteoarthritis, right hip 07/20/2016  . Spinal stenosis of lumbar region 11/29/2015  . Positive urine drug screen 11/16/2015  . Chronic pain syndrome 10/12/2015  . Umbilical hernia 58/30/9407  . Sleeping difficulties 07/15/2015  . Bunion, right 06/20/2015  . Muscle spasm 04/16/2015  . B12 deficiency 03/30/2015  . Hammer toe of left foot 02/16/2015  . Fibrosis of skin of lower extremity 02/16/2015  . Cervical spondylosis with radiculopathy 03/01/2014  .  Hammer toe of right foot 01/26/2014  . Benign intracranial hypertension 06/18/2013  . Cephalalgia 04/21/2013  . Porokeratosis 03/31/2013  . Vitamin D deficiency 04/18/2010  . MEDIAL MENISCUS TEAR, LEFT 06/30/2008  . BARRETTS ESOPHAGUS 06/28/2008  . Knee osteoarthritis 06/28/2008  . OBESITY 05/24/2008  . HYPERTENSION, BENIGN ESSENTIAL 05/24/2008  . Irritable bowel syndrome 05/24/2008  . BARIATRIC SURGERY STATUS 03/19/2006  . History of malignant neoplasm of large  intestine 03/19/1997    Goals Addressed   None     Follow-Up:  Pharmacist Review   A general adherence call was made to Misty [name] for a wellness call to see how she was doing since her last visit with the clinical pharmacist Mendel Ryder. The patient she stated that she was in a lot of pain because of hip pain. The patient states that she has lost a lot of weight just so that she can have this surgery. The patient also states that her blood pressure is normal unless she is in a lot of pain it gets really high. The patient states that she is on a special diet called omnitrition which is helping her lose  A lot of weight but she is not able to be active because of the pain in her hip and back. The patient states that she does get down some because of the pain not wanting her mother to see her suffering like she is. The patient states that other than that she is ok. I told the patient that I will pass along the information to the clinical pharmacist Mendel Ryder.   Wendy Poet, Clinical Pharmacist Assistant [facility]

## 2020-03-07 DIAGNOSIS — M1612 Unilateral primary osteoarthritis, left hip: Secondary | ICD-10-CM | POA: Diagnosis not present

## 2020-03-09 DIAGNOSIS — M160 Bilateral primary osteoarthritis of hip: Secondary | ICD-10-CM | POA: Diagnosis not present

## 2020-03-09 DIAGNOSIS — M47817 Spondylosis without myelopathy or radiculopathy, lumbosacral region: Secondary | ICD-10-CM | POA: Diagnosis not present

## 2020-03-09 DIAGNOSIS — M87059 Idiopathic aseptic necrosis of unspecified femur: Secondary | ICD-10-CM | POA: Diagnosis not present

## 2020-03-09 DIAGNOSIS — M5136 Other intervertebral disc degeneration, lumbar region: Secondary | ICD-10-CM | POA: Diagnosis not present

## 2020-03-16 ENCOUNTER — Other Ambulatory Visit: Payer: Self-pay

## 2020-03-16 ENCOUNTER — Ambulatory Visit (INDEPENDENT_AMBULATORY_CARE_PROVIDER_SITE_OTHER): Payer: Medicare Other | Admitting: Podiatry

## 2020-03-16 ENCOUNTER — Encounter: Payer: Self-pay | Admitting: Podiatry

## 2020-03-16 DIAGNOSIS — Q828 Other specified congenital malformations of skin: Secondary | ICD-10-CM | POA: Diagnosis not present

## 2020-03-16 NOTE — Progress Notes (Addendum)
This patient presents to the office to have her callus trimmed on her tight foot..    She says the callus is painful walking.  .  She presents for preventative foot care services.   Vascular  Dorsalis pedis and posterior tibial pulses are palpable  B/L.  Capillary return  WNL.  Temperature gradient is  WNL.  Skin turgor  WNL  Sensorium  Senn Weinstein monofilament wire  WNL. Normal tactile sensation.  Nail Exam  Patient has normal nails with no evidence of bacterial or fungal infection.  Orthopedic  Exam  Muscle tone and muscle strength  WNL.  No limitations of motion feet  B/L.  No crepitus or joint effusion noted.  Foot type is unremarkable and digits show no abnormalities.  HAV 1st MPJ right foot.  Skin  No open lesions.  Normal skin texture and turgor. Porokeratosis sub 3,5 right foot.  Porokeratosis  Right foot.  Debride porokeratosis using a # 15 blade.  RTC 10 weeks .  Upon cleaning the room Ammie said the nail nippers were missing.   Helane Gunther DPM

## 2020-03-17 ENCOUNTER — Other Ambulatory Visit: Payer: Self-pay | Admitting: Internal Medicine

## 2020-03-20 NOTE — Progress Notes (Signed)
Subjective:    Patient ID: Misty [name], female    DOB: 1961-03-17, 60 y.o.   MRN: [id number]  HPI She is here for pre-operative clearance at the request of Dr Lyla Glassing for left hip replacement scheduled - TBD.   She denies any personal or family history of problems with anesthesia or bleeding/blood clot problems.    She has no concerns.  She is taking all her medication as prescribed.   She is not exercising regularly due to her pain.  She has lost weight with diet changes.  With her daily activities she denies chest pain, palpitations, SOB and lightheadedness.        Medications and allergies reviewed with patient and updated if appropriate.  Patient Active Problem List   Diagnosis Date Noted  . Osteoarthritis of left hip 02/19/2020  . COVID-19 virus infection 04/18/2019  . Urinary frequency 03/10/2019  . Restless leg syndrome 03/10/2019  . Hyperlipidemia 11/27/2018  . Anemia 11/25/2018  . Nausea 09/17/2018  . Right groin pain 07/16/2018  . Depression 03/03/2018  . Right shoulder pain 03/03/2018  . Pancreatitis 02/22/2018  . Alcohol abuse 02/22/2018  . Cholelithiasis 02/22/2018  . Adhesive capsulitis of right shoulder 01/20/2018  . Ventral hernia 09/16/2017  . Greater trochanteric bursitis of right hip 04/30/2017  . Prediabetes 04/16/2017  . Numbness 01/29/2017  . Chronic low back pain 07/20/2016  . Unilateral primary osteoarthritis, right hip 07/20/2016  . Spinal stenosis of lumbar region 11/29/2015  . Positive urine drug screen 11/16/2015  . Chronic pain syndrome 10/12/2015  . Umbilical hernia [id number]/6834  . Sleeping difficulties 07/15/2015  . Bunion, right 06/20/2015  . Muscle spasm 04/16/2015  . B12 deficiency 03/30/2015  . Hammer toe of left foot 02/16/2015  . Fibrosis of skin of lower extremity 02/16/2015  . Cervical spondylosis with radiculopathy 03/01/2014  . Hammer toe of right foot 01/26/2014  . Benign intracranial hypertension 06/18/2013  .  Cephalalgia 04/21/2013  . Porokeratosis 03/31/2013  . Vitamin D deficiency 04/18/2010  . MEDIAL MENISCUS TEAR, LEFT 06/30/2008  . BARRETTS ESOPHAGUS 06/28/2008  . Knee osteoarthritis 06/28/2008  . OBESITY 05/24/2008  . HYPERTENSION, BENIGN ESSENTIAL 05/24/2008  . Irritable bowel syndrome 05/24/2008  . BARIATRIC SURGERY STATUS 03/19/2006  . History of malignant neoplasm of large intestine 03/19/1997    Current Outpatient Medications on File Prior to Visit  Medication Sig Dispense Refill  . aspirin EC 81 MG tablet Take 81 mg by mouth daily.    Marland Kitchen atorvastatin (LIPITOR) 20 MG tablet Take 1 tablet by mouth once daily 90 tablet 0  . bisacodyl (DULCOLAX) 5 MG EC tablet SMARTSIG:By Mouth    . cyanocobalamin (,VITAMIN B-12,) 1000 MCG/ML injection Inject 1,000 mcg into the muscle once. Every 30 days, first dose on Thursday, 01/07/20 at 1615.    . diclofenac Sodium (VOLTAREN) 1 % GEL APPLY 2 GRAMS TOPICALLY 4 TIMES DAILY 100 g 0  . docusate sodium (COLACE) 100 MG capsule Take 1 capsule (100 mg total) by mouth 2 (two) times daily as needed for mild constipation. 60 capsule 5  . gabapentin (NEURONTIN) 300 MG capsule TAKE 2 CAP BY MOUTH IN THE MORNING AND 2 CAPS IN THE AFTERNOON AND 3 CAPSULES EVERY DAY AT BEDTIME. 210 capsule 5  . lisinopril (ZESTRIL) 20 MG tablet Take 1 tablet by mouth once daily 90 tablet 0  . meloxicam (MOBIC) 15 MG tablet Take 1 tablet by mouth once daily with food 90 tablet 0  . NARCAN 4 MG/0.1ML LIQD  nasal spray kit SPRAY 0.1 ML (4MG) IN 1 NOSTRIL BY INTRANASAL ROUTE. MAY REPEAT DOSE EVERY 2 TO 3 MINUTES AS NEEDED ALTERNATING NOSTRILS WITH EACH DOSE    . ondansetron (ZOFRAN) 4 MG tablet TAKE 1 TABLET BY MOUTH EVERY 6 HOURS AS NEEDED FOR NAUSEA 20 tablet 0  . Oxycodone HCl 10 MG TABS Take 10 mg by mouth 5 (five) times daily.     . Syringe/Needle, Disp, (SYRINGE 3CC/25GX1") 25G X 1" 3 ML MISC Use for monthly B12 injections 3 each 3  . triamcinolone cream (KENALOG) 0.5 %  triamcinolone acetonide 0.5 % topical cream  APPLY CREAM TOPICALLY TO AFFECTED AREA THREE TIMES DAILY AS NEEDED    . VENTOLIN HFA 108 (90 Base) MCG/ACT inhaler Inhale 1-2 puffs into the lungs every 4 (four) hours as needed for wheezing or shortness of breath. 6.7 g 5  . XTAMPZA ER 18 MG C12A Take 1 capsule by mouth every 12 (twelve) hours.    Marland Kitchen oxyCODONE ER (XTAMPZA ER) 13.5 MG C12A Xtampza ER 13.5 mg capsule sprinkle  TAKE 1 CAPSULE BY MOUTH EVERY 12 HOURS (Patient not taking: Reported on 03/21/2020)     No current facility-administered medications on file prior to visit.    Past Medical History:  Diagnosis Date  . Allergy   . Arthritis   . Back pain   . Colon cancer ([place])   . Depression   . Diarrhea   . GERD (gastroesophageal reflux disease)   . Hypertension   . Insomnia   . MS (multiple sclerosis) ([place])   . Other hammer toe (acquired) 03/31/2013  . Pneumonia    hx of  . Umbilical hernia     Past Surgical History:  Procedure Laterality Date  . ABDOMINAL HYSTERECTOMY    . ANTERIOR CERVICAL DECOMP/DISCECTOMY FUSION N/A 03/01/2014   Procedure: ANTERIOR CERVICAL DECOMPRESSION/DISCECTOMY FUSION 1 LEVEL;  Surgeon: Charlie Pitter, MD;  Location: [facility];  Service: Neurosurgery;  Laterality: N/A;  ANTERIOR CERVICAL DECOMPRESSION/DISCECTOMY FUSION 1 LEVEL  . BUNIONECTOMY Right 10/14/2014   _0   . CESAREAN SECTION    . COLON SURGERY    . COLONOSCOPY    . FOOT SURGERY Right   . GASTRIC BYPASS    . Hammer Toe Repair Right 10/14/2014   RT #2, _1   . TENOTOMY Right 10/14/2014   RT #3, _2     Social History   Socioeconomic History  . Marital status: Single    Spouse name: Not on file  . Number of children: 2  . Years of education: Not on file  . Highest education level: Not on file  Occupational History  . Occupation: Disability  Tobacco Use  . Smoking status: Former Smoker    Packs/day: 0.25    Years: 20.00    Pack years: 5.00    Types: Cigarettes    Quit date:  09/22/2018    Years since quitting: 1.4  . Smokeless tobacco: Never Used  . Tobacco comment: states she quit 3 months ago  Vaping Use  . Vaping Use: Never used  Substance and Sexual Activity  . Alcohol use: Yes    Alcohol/week: 0.0 standard drinks    Comment: 1/2 pint daily  . Drug use: No  . Sexual activity: Yes    Partners: Male  Other Topics Concern  . Not on file  Social History Narrative  . Not on file   Social Determinants of Health   Financial Resource Strain: Low Risk   . Difficulty of Paying  Living Expenses: Not hard at all  Food Insecurity: Not on file  Transportation Needs: Not on file  Physical Activity: Not on file  Stress: Not on file  Social Connections: Not on file    Family History  Problem Relation Age of Onset  . Stroke Mother   . Hypertension Mother   . Hyperlipidemia Mother   . Diabetes Mother   . Diabetes Brother   . Hypertension Brother   . Hypertension Brother   . Hypertension Brother   . Hypertension Brother   . Hypertension Brother   . Alcoholism Brother     Review of Systems  Constitutional: Negative for chills and fever.  Eyes: Negative for visual disturbance.  Respiratory: Negative for cough, shortness of breath and wheezing.   Cardiovascular: Negative for chest pain, palpitations and leg swelling.  Gastrointestinal: Positive for nausea (occasional). Negative for abdominal pain, blood in stool, constipation, diarrhea and vomiting.  Genitourinary: Negative for dysuria and hematuria.  Musculoskeletal: Positive for arthralgias.  Skin: Negative for rash.  Neurological: Negative for dizziness, light-headedness and headaches.       Objective:   Vitals:   03/21/20 1412  BP: 132/82  Pulse: 97  Temp: 98.1 F (36.7 C)  SpO2: 98%   BP Readings from Last 3 Encounters:  03/21/20 132/82  02/19/20 130/72  02/04/20 (!) 154/80   Wt Readings from Last 3 Encounters:  03/21/20 228 lb (103.4 kg)  02/19/20 250 lb 9.6 oz (113.7 kg)   02/04/20 239 lb (108.4 kg)   Body mass index is 39.14 kg/m.   Physical Exam Constitutional:      General: She is not in acute distress.    Appearance: Normal appearance. She is not ill-appearing.  HENT:     Head: Normocephalic and atraumatic.     Right Ear: Tympanic membrane, ear canal and external ear normal.     Left Ear: Tympanic membrane, ear canal and external ear normal.     Mouth/Throat:     Mouth: Mucous membranes are moist.     Pharynx: No posterior oropharyngeal erythema.     Comments: Poor dentition Eyes:     Conjunctiva/sclera: Conjunctivae normal.  Neck:     Vascular: No carotid bruit.  Cardiovascular:     Rate and Rhythm: Normal rate and regular rhythm.     Heart sounds: No murmur heard.   Pulmonary:     Effort: Pulmonary effort is normal. No respiratory distress.     Breath sounds: No wheezing or rales.  Abdominal:     General: There is no distension.     Palpations: Abdomen is soft.     Tenderness: There is no abdominal tenderness. There is no guarding or rebound.  Musculoskeletal:     Cervical back: Neck supple. No tenderness.     Right lower leg: No edema.     Left lower leg: No edema.  Lymphadenopathy:     Cervical: No cervical adenopathy.  Skin:    General: Skin is warm and dry.  Neurological:     Mental Status: She is alert.  Psychiatric:        Mood and Affect: Mood normal.            Assessment & Plan:    See Problem List for Assessment and Plan of chronic medical problems.    This visit occurred during the SARS-CoV-2 public health emergency.  Safety protocols were in place, including screening questions prior to the visit, additional usage of staff PPE, and extensive cleaning  of exam room while observing appropriate contact time as indicated for disinfecting solutions.

## 2020-03-21 ENCOUNTER — Other Ambulatory Visit: Payer: Self-pay

## 2020-03-21 ENCOUNTER — Ambulatory Visit (INDEPENDENT_AMBULATORY_CARE_PROVIDER_SITE_OTHER): Payer: Medicare Other | Admitting: Internal Medicine

## 2020-03-21 ENCOUNTER — Encounter: Payer: Self-pay | Admitting: Internal Medicine

## 2020-03-21 VITALS — BP 132/82 | HR 97 | Temp 98.1°F | Ht 64.0 in | Wt 228.0 lb

## 2020-03-21 DIAGNOSIS — E7849 Other hyperlipidemia: Secondary | ICD-10-CM | POA: Diagnosis not present

## 2020-03-21 DIAGNOSIS — I10 Essential (primary) hypertension: Secondary | ICD-10-CM

## 2020-03-21 DIAGNOSIS — E538 Deficiency of other specified B group vitamins: Secondary | ICD-10-CM | POA: Diagnosis not present

## 2020-03-21 DIAGNOSIS — Z01818 Encounter for other preprocedural examination: Secondary | ICD-10-CM | POA: Insufficient documentation

## 2020-03-21 DIAGNOSIS — R7303 Prediabetes: Secondary | ICD-10-CM

## 2020-03-21 LAB — CBC WITH DIFFERENTIAL/PLATELET
Basophils Absolute: 0.1 10*3/uL (ref 0.0–0.1)
Basophils Relative: 0.9 % (ref 0.0–3.0)
Eosinophils Absolute: 0.1 10*3/uL (ref 0.0–0.7)
Eosinophils Relative: 1.2 % (ref 0.0–5.0)
HCT: 38.2 % (ref 36.0–46.0)
Hemoglobin: 12.2 g/dL (ref 12.0–15.0)
Lymphocytes Relative: 23.5 % (ref 12.0–46.0)
Lymphs Abs: 1.6 10*3/uL (ref 0.7–4.0)
MCHC: 31.9 g/dL (ref 30.0–36.0)
MCV: 92.5 fl (ref 78.0–100.0)
Monocytes Absolute: 0.6 10*3/uL (ref 0.1–1.0)
Monocytes Relative: 9.4 % (ref 3.0–12.0)
Neutro Abs: 4.4 10*3/uL (ref 1.4–7.7)
Neutrophils Relative %: 65 % (ref 43.0–77.0)
Platelets: 210 10*3/uL (ref 150.0–400.0)
RBC: 4.13 Mil/uL (ref 3.87–5.11)
RDW: 13.9 % (ref 11.5–15.5)
WBC: 6.8 10*3/uL (ref 4.0–10.5)

## 2020-03-21 LAB — COMPREHENSIVE METABOLIC PANEL
ALT: 19 U/L (ref 0–35)
AST: 20 U/L (ref 0–37)
Albumin: 4.1 g/dL (ref 3.5–5.2)
Alkaline Phosphatase: 97 U/L (ref 39–117)
BUN: 32 mg/dL — ABNORMAL HIGH (ref 6–23)
CO2: 26 mEq/L (ref 19–32)
Calcium: 10.6 mg/dL — ABNORMAL HIGH (ref 8.4–10.5)
Chloride: 104 mEq/L (ref 96–112)
Creatinine, Ser: 0.91 mg/dL (ref 0.40–1.20)
GFR: 69.33 mL/min (ref >60.00)
Glucose, Bld: 84 mg/dL (ref 70–99)
Potassium: 5 mEq/L (ref 3.5–5.1)
Sodium: 138 mEq/L (ref 135–145)
Total Bilirubin: 0.7 mg/dL (ref 0.2–1.2)
Total Protein: 7.5 g/dL (ref 6.0–8.3)

## 2020-03-21 LAB — LIPID PANEL
Cholesterol: 213 mg/dL — ABNORMAL HIGH (ref 0–200)
HDL: 74.9 mg/dL (ref >39.00)
LDL Cholesterol: 118 mg/dL — ABNORMAL HIGH (ref 0–99)
NonHDL: 138.53
Total CHOL/HDL Ratio: 3
Triglycerides: 101 mg/dL (ref 0.0–149.0)
VLDL: 20.2 mg/dL (ref 0.0–40.0)

## 2020-03-21 LAB — URINALYSIS
Hgb urine dipstick: NEGATIVE
Leukocytes,Ua: NEGATIVE
Nitrite: NEGATIVE
Specific Gravity, Urine: 1.025 (ref 1.000–1.030)
Total Protein, Urine: NEGATIVE
Urine Glucose: NEGATIVE
Urobilinogen, UA: 0.2 (ref 0.0–1.0)
pH: 5.5 (ref 5.0–8.0)

## 2020-03-21 LAB — HEMOGLOBIN A1C: Hgb A1c MFr Bld: 5.7 % (ref 4.6–6.5)

## 2020-03-21 MED ORDER — CYANOCOBALAMIN 1000 MCG/ML IJ SOLN
1000.0000 ug | Freq: Once | INTRAMUSCULAR | Status: AC
  Administered 2020-03-21: 1000 ug via INTRAMUSCULAR

## 2020-03-21 NOTE — Assessment & Plan Note (Signed)
Here for pre-op clearance for THR on left No concerning symptoms, chronic medication problems are controlled EKG today - poor baseline, NSR at 94 bpm, nonspecific St and T wave abnormality, no significant change compared to prior EKG from 02/2018 Cbc, cmp, UA, a1c  Will need to stop ASA 5-7 days prior to surgery, restart when able Medically stable and cleared for surgery - will fax form to surgery

## 2020-03-21 NOTE — Patient Instructions (Signed)
  Blood work was ordered.     Medications changes include : none      We will send your surgical clearance form over to Dr Linna Caprice.

## 2020-03-21 NOTE — Assessment & Plan Note (Signed)
Chronic Check a1c Low sugar / carb diet Continue weight loss efforts

## 2020-03-21 NOTE — Assessment & Plan Note (Signed)
Chronic BP well controlled Continue lisinopril 20 mg daily cmp  

## 2020-03-21 NOTE — Assessment & Plan Note (Addendum)
Chronic Check lipid panel  Continue atorvastatin 20 mg daily Continue weight loss efforts

## 2020-03-21 NOTE — Assessment & Plan Note (Signed)
Chronic B12 injection monthly Due for B12 injection today - will give Continue monthly injections

## 2020-03-23 DIAGNOSIS — M25551 Pain in right hip: Secondary | ICD-10-CM | POA: Diagnosis not present

## 2020-03-23 DIAGNOSIS — G8929 Other chronic pain: Secondary | ICD-10-CM | POA: Diagnosis not present

## 2020-03-25 ENCOUNTER — Ambulatory Visit: Payer: Medicare Other | Admitting: Internal Medicine

## 2020-03-28 ENCOUNTER — Ambulatory Visit: Payer: Self-pay | Admitting: Student

## 2020-04-01 ENCOUNTER — Telehealth: Payer: Self-pay | Admitting: Internal Medicine

## 2020-04-01 MED ORDER — TIZANIDINE HCL 2 MG PO CAPS: 2.0000 mg | ORAL_CAPSULE | Freq: Every evening | ORAL | 0 refills | Status: DC | PRN

## 2020-04-01 NOTE — Telephone Encounter (Signed)
We can try low-dose of a muscle relaxer.  She is already on the gabapentin, which sometimes helps.  Surgery may also give her a muscle relaxer after surgery.  Prescription sent to pharmacy

## 2020-04-01 NOTE — Telephone Encounter (Signed)
Patient called and was wondering if something for her left leg cramps could be called in. She is having surgery on 04/13/20. Her last OV was 1.3.22, it can be sent to [facility], [place] [id number]

## 2020-04-05 ENCOUNTER — Telehealth: Payer: Medicare Other

## 2020-04-05 NOTE — Telephone Encounter (Signed)
Spoke with patient today. 
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
## 2020-04-06 DIAGNOSIS — M87059 Idiopathic aseptic necrosis of unspecified femur: Secondary | ICD-10-CM | POA: Diagnosis not present

## 2020-04-06 DIAGNOSIS — Z79891 Long term (current) use of opiate analgesic: Secondary | ICD-10-CM | POA: Diagnosis not present

## 2020-04-06 DIAGNOSIS — Z79899 Other long term (current) drug therapy: Secondary | ICD-10-CM | POA: Diagnosis not present

## 2020-04-06 DIAGNOSIS — G894 Chronic pain syndrome: Secondary | ICD-10-CM | POA: Diagnosis not present

## 2020-04-06 DIAGNOSIS — M160 Bilateral primary osteoarthritis of hip: Secondary | ICD-10-CM | POA: Diagnosis not present

## 2020-04-06 DIAGNOSIS — M5136 Other intervertebral disc degeneration, lumbar region: Secondary | ICD-10-CM | POA: Diagnosis not present

## 2020-04-06 NOTE — Progress Notes (Signed)
COVID Vaccine Completed: x1 Date COVID Vaccine completed:  02-12-20 COVID vaccine manufacturer: Pfizer      PCP - Billey Gosling, MD Cardiologist -   Medical clearance in note dated 03-21-20 by Dr. Quay Burow in Epic  Chest x-ray -  EKG - 03-21-20 Stress Test -  ECHO -  Cardiac Cath -  Pacemaker/ICD device last checked:  Sleep Study -  CPAP -   Fasting Blood Sugar -  Checks Blood Sugar _____ times a day  Blood Thinner Instructions: Aspirin Instructions: Last Dose:  Anesthesia review: MS  Patient denies shortness of breath, fever, cough and chest pain at PAT appointment   Patient verbalized understanding of instructions that were given to them at the PAT appointment. Patient was also instructed that they will need to review over the PAT instructions again at home before surgery.

## 2020-04-06 NOTE — Patient Instructions (Addendum)
DUE TO COVID-19 ONLY ONE VISITOR IS ALLOWED TO COME WITH YOU AND STAY IN THE WAITING ROOM ONLY DURING PRE OP AND PROCEDURE.   IF YOU WILL BE ADMITTED INTO THE HOSPITAL YOU ARE ALLOWED ONE SUPPORT PERSON DURING VISITATION HOURS ONLY (10AM -8PM)   . The support person may change daily. . The support person must pass our screening, gel in and out, and wear a mask at all times, including in the patient's room. . Patients must also wear a mask when staff or their support person are in the room.   COVID SWAB TESTING MUST BE COMPLETED ON:   Monday, 04-18-20 @   [address] [place], [place] [address]  (Must self quarantine after testing. Follow instructions on handout.)    Your procedure is scheduled on:  Thursday, 04-21-20   Report to [facility] Main  Entrance   Report to Short Stay at 5:30 AM   [facility])    Call this number if you have problems the morning of surgery [phone]   Do not eat food :After Midnight.   May have liquids until 4:30 AM day of surgery  CLEAR LIQUID DIET  Foods Allowed                                                                     Foods Excluded  Water, Black Coffee and tea, regular and decaf             liquids that you cannot  Plain Jell-O in any flavor  (No red)                                   see through such as: Fruit ices (not with fruit pulp)                                      milk, soups, orange juice              Iced Popsicles (No red)                                      All solid food                                   Apple juices Sports drinks like Gatorade (No red) Lightly seasoned clear broth or consume(fat free) Sugar, honey syrup     Complete one Ensure drink the morning of surgery at  4:30 AM  the day of surgery.      1. The day of surgery:  ? Drink ONE (1) Pre-Surgery Clear Ensure or G2 by am the morning of surgery. Drink in one sitting. Do not sip.  ? This drink was given to you during your hospital  pre-op  appointment visit. ? Nothing else to drink after completing the  Pre-Surgery Clear Ensure or G2.          If you have questions, please  contact your surgeon's office.     Oral Hygiene is also important to reduce your risk of infection.                                    Remember - BRUSH YOUR TEETH THE MORNING OF SURGERY WITH YOUR REGULAR TOOTHPASTE   Do NOT smoke after Midnight     Take these medicines the morning of surgery with A SIP OF WATER:   Atorvastatin, Gabapentin, Okay to use inhaler and bring with you day of surgery                                You may not have any metal on your body including hair pins, jewelry, and body piercings             Do not wear make-up, lotions, powders, perfumes/cologne, or deodorant             Do not wear nail polish.  Do not shave  48 hours prior to surgery.              Do not bring valuables to the hospital. [place].   Contacts, dentures or bridgework may not be worn into surgery.   Bring small overnight bag day of surgery.      Special Instructions: Bring a copy of your healthcare power of attorney and living will documents         the day of surgery if you haven't scanned them in before.              Please read over the following fact sheets you were given: IF YOU HAVE QUESTIONS ABOUT YOUR PRE OP INSTRUCTIONS PLEASE CALL [phone]   [place] - Preparing for Surgery Before surgery, you can play an important role.  Because skin is not sterile, your skin needs to be as free of germs as possible.  You can reduce the number of germs on your skin by washing with CHG (chlorahexidine gluconate) soap before surgery.  CHG is an antiseptic cleaner which kills germs and bonds with the skin to continue killing germs even after washing. Please DO NOT use if you have an allergy to CHG or antibacterial soaps.  If your skin becomes reddened/irritated stop using the CHG and inform your nurse when you arrive  at Short Stay. Do not shave (including legs and underarms) for at least 48 hours prior to the first CHG shower.  You may shave your face/neck.  Please follow these instructions carefully:  1.  Shower with CHG Soap the night before surgery and the  morning of surgery.  2.  If you choose to wash your hair, wash your hair first as usual with your normal  shampoo.  3.  After you shampoo, rinse your hair and body thoroughly to remove the shampoo.                             4.  Use CHG as you would any other liquid soap.  You can apply chg directly to the skin and wash.  Gently with a scrungie or clean washcloth.  5.  Apply the CHG Soap to your body ONLY FROM THE NECK DOWN.   Do   not use on face/ open  Wound or open sores. Avoid contact with eyes, ears mouth and   genitals (private parts).                       Wash face,  Genitals (private parts) with your normal soap.             6.  Wash thoroughly, paying special attention to the area where your    surgery  will be performed.  7.  Thoroughly rinse your body with warm water from the neck down.  8.  DO NOT shower/wash with your normal soap after using and rinsing off the CHG Soap.                9.  Pat yourself dry with a clean towel.            10.  Wear clean pajamas.            11.  Place clean sheets on your bed the night of your first shower and do not  sleep with pets. Day of Surgery : Do not apply any lotions/deodorants the morning of surgery.  Please wear clean clothes to the hospital/surgery center.  FAILURE TO FOLLOW THESE INSTRUCTIONS MAY RESULT IN THE CANCELLATION OF YOUR SURGERY  PATIENT SIGNATURE_________________________________  NURSE SIGNATURE__________________________________  ________________________________________________________________________   Misty [name]  An incentive spirometer is a tool that can help keep your lungs clear and active. This tool measures how well you are filling  your lungs with each breath. Taking long deep breaths may help reverse or decrease the chance of developing breathing (pulmonary) problems (especially infection) following:  A long period of time when you are unable to move or be active. BEFORE THE PROCEDURE   If the spirometer includes an indicator to show your best effort, your nurse or respiratory therapist will set it to a desired goal.  If possible, sit up straight or lean slightly forward. Try not to slouch.  Hold the incentive spirometer in an upright position. INSTRUCTIONS FOR USE  1. Sit on the edge of your bed if possible, or sit up as far as you can in bed or on a chair. 2. Hold the incentive spirometer in an upright position. 3. Breathe out normally. 4. Place the mouthpiece in your mouth and seal your lips tightly around it. 5. Breathe in slowly and as deeply as possible, raising the piston or the ball toward the top of the column. 6. Hold your breath for 3-5 seconds or for as long as possible. Allow the piston or ball to fall to the bottom of the column. 7. Remove the mouthpiece from your mouth and breathe out normally. 8. Rest for a few seconds and repeat Steps 1 through 7 at least 10 times every 1-2 hours when you are awake. Take your time and take a few normal breaths between deep breaths. 9. The spirometer may include an indicator to show your best effort. Use the indicator as a goal to work toward during each repetition. 10. After each set of 10 deep breaths, practice coughing to be sure your lungs are clear. If you have an incision (the cut made at the time of surgery), support your incision when coughing by placing a pillow or rolled up towels firmly against it. Once you are able to get out of bed, walk around indoors and cough well. You may stop using the incentive spirometer when instructed by your caregiver.  RISKS AND COMPLICATIONS  Take your time  so you do not get dizzy or light-headed.  If you are in pain, you may  need to take or ask for pain medication before doing incentive spirometry. It is harder to take a deep breath if you are having pain. AFTER USE  Rest and breathe slowly and easily.  It can be helpful to keep track of a log of your progress. Your caregiver can provide you with a simple table to help with this. If you are using the spirometer at home, follow these instructions: [place] IF:   You are having difficultly using the spirometer.  You have trouble using the spirometer as often as instructed.  Your pain medication is not giving enough relief while using the spirometer.  You develop fever of 100.5 F (38.1 C) or higher. SEEK IMMEDIATE MEDICAL CARE IF:   You cough up bloody sputum that had not been present before.  You develop fever of 102 F (38.9 C) or greater.  You develop worsening pain at or near the incision site. MAKE SURE YOU:   Understand these instructions.  Will watch your condition.  Will get help right away if you are not doing well or get worse. Document Released: 07/16/2006 Document Revised: 05/28/2011 Document Reviewed: 09/16/2006 Exit[employer] Patient Information 2014 [employer], [place].   ________________________________________________________________________  WHAT IS A BLOOD TRANSFUSION? Blood Transfusion Information  A transfusion is the replacement of blood or some of its parts. Blood is made up of multiple cells which provide different functions.  Red blood cells carry oxygen and are used for blood loss replacement.  White blood cells fight against infection.  Platelets control bleeding.  Plasma helps clot blood.  Other blood products are available for specialized needs, such as hemophilia or other clotting disorders. BEFORE THE TRANSFUSION  Who gives blood for transfusions?   Healthy volunteers who are fully evaluated to make sure their blood is safe. This is blood bank blood. Transfusion therapy is the safest it has ever been in  the practice of medicine. Before blood is taken from a donor, a complete history is taken to make sure that person has no history of diseases nor engages in risky social behavior (examples are intravenous drug use or sexual activity with multiple partners). The donor's travel history is screened to minimize risk of transmitting infections, such as malaria. The donated blood is tested for signs of infectious diseases, such as HIV and hepatitis. The blood is then tested to be sure it is compatible with you in order to minimize the chance of a transfusion reaction. If you or a relative donates blood, this is often done in anticipation of surgery and is not appropriate for emergency situations. It takes many days to process the donated blood. RISKS AND COMPLICATIONS Although transfusion therapy is very safe and saves many lives, the main dangers of transfusion include:   Getting an infectious disease.  Developing a transfusion reaction. This is an allergic reaction to something in the blood you were given. Every precaution is taken to prevent this. The decision to have a blood transfusion has been considered carefully by your caregiver before blood is given. Blood is not given unless the benefits outweigh the risks. AFTER THE TRANSFUSION  Right after receiving a blood transfusion, you will usually feel much better and more energetic. This is especially true if your red blood cells have gotten low (anemic). The transfusion raises the level of the red blood cells which carry oxygen, and this usually causes an energy increase.  The  nurse administering the transfusion will monitor you carefully for complications. HOME CARE INSTRUCTIONS  No special instructions are needed after a transfusion. You may find your energy is better. Speak with your caregiver about any limitations on activity for underlying diseases you may have. SEEK MEDICAL CARE IF:   Your condition is not improving after your  transfusion.  You develop redness or irritation at the intravenous (IV) site. SEEK IMMEDIATE MEDICAL CARE IF:  Any of the following symptoms occur over the next 12 hours:  Shaking chills.  You have a temperature by mouth above 102 F (38.9 C), not controlled by medicine.  Chest, back, or muscle pain.  People around you feel you are not acting correctly or are confused.  Shortness of breath or difficulty breathing.  Dizziness and fainting.  You get a rash or develop hives.  You have a decrease in urine output.  Your urine turns a dark color or changes to pink, red, or brown. Any of the following symptoms occur over the next 10 days:  You have a temperature by mouth above 102 F (38.9 C), not controlled by medicine.  Shortness of breath.  Weakness after normal activity.  The white part of the eye turns yellow (jaundice).  You have a decrease in the amount of urine or are urinating less often.  Your urine turns a dark color or changes to pink, red, or brown. Document Released: 03/02/2000 Document Revised: 05/28/2011 Document Reviewed: 10/20/2007 [facility] Patient Information 2014 [place], [place].  _______________________________________________________________________

## 2020-04-07 ENCOUNTER — Encounter (HOSPITAL_COMMUNITY)
Admission: RE | Admit: 2020-04-07 | Discharge: 2020-04-07 | Disposition: A | Discharge status: Home / Self Care | Payer: Medicare Other | Source: Ambulatory Visit | Attending: Orthopedic Surgery | Admitting: Orthopedic Surgery

## 2020-04-08 ENCOUNTER — Ambulatory Visit: Payer: Self-pay | Admitting: Student

## 2020-04-08 NOTE — H&P (Signed)
TOTAL HIP ADMISSION H&P  Patient is admitted for left total hip arthroplasty.  Subjective:  Chief Complaint: left hip pain  HPI: [name], 60 y.o. female, has a history of pain and functional disability in the left hip(s) due to  avascular necrosis  and patient has failed non-surgical conservative treatments for greater than 12 weeks to include NSAID's and/or analgesics and activity modification.  Onset of symptoms was gradual starting 2 years ago with gradually worsening course since that time.The patient noted no past surgery on the left hip(s).  Patient currently rates pain in the left hip at 8 out of 10 with activity. Patient has worsening of pain with activity and weight bearing, pain that interfers with activities of daily living and pain with passive range of motion. Patient has evidence of subchondral cysts and joint space narrowing by imaging studies. This condition presents safety issues increasing the risk of falls. There is no current active infection.  Patient Active Problem List   Diagnosis Date Noted   Preoperative clearance 03/21/2020   Osteoarthritis of left hip 02/19/2020   COVID-19 virus infection 04/18/2019   Urinary frequency 03/10/2019   Restless leg syndrome 03/10/2019   Hyperlipidemia 11/27/2018   Anemia 11/25/2018   Nausea 09/17/2018   Right groin pain 07/16/2018   Depression 03/03/2018   Right shoulder pain 03/03/2018   Pancreatitis 02/22/2018   Alcohol abuse 02/22/2018   Cholelithiasis 02/22/2018   Adhesive capsulitis of right shoulder 01/20/2018   Ventral hernia 09/16/2017   Greater trochanteric bursitis of right hip 04/30/2017   Prediabetes 04/16/2017   Numbness 01/29/2017   Chronic low back pain 07/20/2016   Unilateral primary osteoarthritis, right hip 07/20/2016   Spinal stenosis of lumbar region 11/29/2015   Positive urine drug screen 11/16/2015   Chronic pain syndrome 93/81/0175   Umbilical hernia 01/10/8526   Sleeping difficulties  07/15/2015   Bunion, right 06/20/2015   Muscle spasm 04/16/2015   B12 deficiency 03/30/2015   Hammer toe of left foot 02/16/2015   Fibrosis of skin of lower extremity 02/16/2015   Cervical spondylosis with radiculopathy 03/01/2014   Hammer toe of right foot 01/26/2014   Benign intracranial hypertension 06/18/2013   Cephalalgia 04/21/2013   Porokeratosis 03/31/2013   Vitamin D deficiency 04/18/2010   MEDIAL MENISCUS TEAR, LEFT 06/30/2008   BARRETTS ESOPHAGUS 06/28/2008   Knee osteoarthritis 06/28/2008   OBESITY 05/24/2008   HYPERTENSION, BENIGN ESSENTIAL 05/24/2008   Irritable bowel syndrome 05/24/2008   BARIATRIC SURGERY STATUS 03/19/2006   History of malignant neoplasm of large intestine 03/19/1997   Past Medical History:  Diagnosis Date   Allergy    Arthritis    Back pain    Colon cancer ([place])    Depression    Diarrhea    GERD (gastroesophageal reflux disease)    Hypertension    Insomnia    MS (multiple sclerosis) (HCC)    Other hammer toe (acquired) 03/31/2013   Pneumonia    hx of   Umbilical hernia     Past Surgical History:  Procedure Laterality Date   ABDOMINAL HYSTERECTOMY     ANTERIOR CERVICAL DECOMP/DISCECTOMY FUSION N/A 03/01/2014   Procedure: ANTERIOR CERVICAL DECOMPRESSION/DISCECTOMY FUSION 1 LEVEL;  Surgeon: Charlie Pitter, MD;  Location: [facility];  Service: Neurosurgery;  Laterality: N/A;  ANTERIOR CERVICAL DECOMPRESSION/DISCECTOMY FUSION 1 LEVEL   BUNIONECTOMY Right 10/14/2014   @[facility]    CESAREAN SECTION     COLON SURGERY     COLONOSCOPY     FOOT SURGERY Right  GASTRIC BYPASS     Hammer Toe Repair Right 10/14/2014   RT #2, @[facility]    TENOTOMY Right 10/14/2014   RT #3, @[facility]     Current Outpatient Medications  Medication Sig Dispense Refill Last Dose   acetaminophen (TYLENOL) 650 MG CR tablet Take 1,300 mg by mouth daily as needed for pain.      aspirin EC 81 MG tablet Take 81 mg by mouth daily.      atorvastatin (LIPITOR) 20 MG tablet Take 1 tablet by  mouth once daily (Patient taking differently: Take 20 mg by mouth daily.) 90 tablet 0    cyanocobalamin (,VITAMIN B-12,) 1000 MCG/ML injection Inject 1,000 mcg into the muscle every 30 (thirty) days. Every 30 days, first dose on Thursday      cyclobenzaprine (FLEXERIL) 5 MG tablet Take 5 mg by mouth 3 (three) times daily as needed for muscle spasms.      diclofenac Sodium (VOLTAREN) 1 % GEL APPLY 2 GRAMS TOPICALLY 4 TIMES DAILY (Patient taking differently: Apply 2 g topically 4 (four) times daily as needed (pain).) 100 g 0    docusate sodium (COLACE) 100 MG capsule Take 1 capsule (100 mg total) by mouth 2 (two) times daily as needed for mild constipation. 60 capsule 5    gabapentin (NEURONTIN) 300 MG capsule TAKE 2 CAP BY MOUTH IN THE MORNING AND 2 CAPS IN THE AFTERNOON AND 3 CAPSULES EVERY DAY AT BEDTIME. (Patient taking differently: Take 600-900 mg by mouth See admin instructions. Take 600 mg in the morning, 600 mg in the evening, and 900 mg at bedtime) 210 capsule 5    hydrochlorothiazide (MICROZIDE) 12.5 MG capsule Take 12.5 mg by mouth daily as needed (swelling/high bp).      lisinopril (ZESTRIL) 20 MG tablet Take 1 tablet by mouth once daily (Patient taking differently: Take 20 mg by mouth daily.) 90 tablet 0    meloxicam (MOBIC) 15 MG tablet Take 1 tablet by mouth once daily with food (Patient taking differently: Take 15 mg by mouth daily. WITH FOOD) 90 tablet 0    Multiple Vitamin (MULTIVITAMIN) LIQD Take 30 mLs by mouth daily.      naloxone (NARCAN) nasal spray 4 mg/0.1 mL Place 1 spray into the nose as needed (opioid overdose).      ondansetron (ZOFRAN) 4 MG tablet TAKE 1 TABLET BY MOUTH EVERY 6 HOURS AS NEEDED FOR NAUSEA (Patient taking differently: Take 4 mg by mouth every 6 (six) hours as needed for vomiting or nausea.) 20 tablet 0    Oxycodone HCl 10 MG TABS Take 10 mg by mouth 4 (four) times daily.      Syringe/Needle, Disp, (SYRINGE 3CC/25GX1") 25G X 1" 3 ML MISC Use for monthly B12  injections 3 each 3    tizanidine (ZANAFLEX) 2 MG capsule Take 1 capsule (2 mg total) by mouth at bedtime as needed for muscle spasms. (Patient not taking: No sig reported) 30 capsule 0    triamcinolone cream (KENALOG) 0.5 % Apply 1 application topically daily as needed (rash/dry skin).      VENTOLIN HFA 108 (90 Base) MCG/ACT inhaler Inhale 1-2 puffs into the lungs every 4 (four) hours as needed for wheezing or shortness of breath. 6.7 g 5    XTAMPZA ER 18 MG C12A Take 18 mg by mouth every 12 (twelve) hours.      No current facility-administered medications for this visit.   Allergies  Allergen Reactions   Phenergan [Promethazine Hcl] Anxiety   Trazodone And  Nefazodone Rash    Social History   Tobacco Use   Smoking status: Former Smoker    Packs/day: 0.25    Years: 20.00    Pack years: 5.00    Types: Cigarettes    Quit date: 09/22/2018    Years since quitting: 1.5   Smokeless tobacco: Never Used   Tobacco comment: states she quit 3 months ago  Substance Use Topics   Alcohol use: Yes    Alcohol/week: 0.0 standard drinks    Comment: 1/2 pint daily    Family History  Problem Relation Age of Onset   Stroke Mother    Hypertension Mother    Hyperlipidemia Mother    Diabetes Mother    Diabetes Brother    Hypertension Brother    Hypertension Brother    Hypertension Brother    Hypertension Brother    Hypertension Brother    Alcoholism Brother      Review of Systems  Musculoskeletal: Positive for arthralgias.  All other systems reviewed and are negative.   Objective:  Physical Exam Eyes:     Extraocular Movements: Extraocular movements intact.     Pupils: Pupils are equal, round, and reactive to light.  Cardiovascular:     Rate and Rhythm: Normal rate and regular rhythm.     Pulses: Normal pulses.  Pulmonary:     Breath sounds: Normal breath sounds.  Abdominal:     Palpations: Abdomen is soft.     Tenderness: There is no abdominal tenderness.  Genitourinary:     Comments: Deferred Musculoskeletal:     Cervical back: Normal range of motion.     Comments: Pain left hip with passive ROM  Skin:    General: Skin is warm and dry.  Neurological:     Mental Status: She is alert and oriented to person, place, and time.  Psychiatric:        Mood and Affect: Mood normal.   Swelling in LLE  Vital signs in last 24 hours: @VSRANGES @  Labs:   Estimated body mass index is 39.14 kg/m as calculated from the following:   Height as of 03/21/20: 5\' 4"  (1.626 m).   Weight as of 03/21/20: 103.4 kg.   Imaging Review Plain radiographs demonstrate severe degenerative joint disease of the left hip(s). The bone quality appears to be adequate for age and reported activity level.      Assessment/Plan:  End stage arthritis, left hip(s)  The patient history, physical examination, clinical judgement of the provider and imaging studies are consistent with end stage degenerative joint disease of the left hip(s) and total hip arthroplasty is deemed medically necessary. The treatment options including medical management, injection therapy, arthroscopy and arthroplasty were discussed at length. The risks and benefits of total hip arthroplasty were presented and reviewed. The risks due to aseptic loosening, infection, stiffness, dislocation/subluxation,  thromboembolic complications and other imponderables were discussed.  The patient acknowledged the explanation, agreed to proceed with the plan and consent was signed. Patient is being admitted for inpatient treatment for surgery, pain control, PT, OT, prophylactic antibiotics, VTE prophylaxis, progressive ambulation and ADL's and discharge planning.The patient is planning to be discharged  home

## 2020-04-08 NOTE — H&P (View-Only) (Signed)
TOTAL HIP ADMISSION H&P  Patient is admitted for left total hip arthroplasty.  Subjective:  Chief Complaint: left hip pain  HPI: Misty [name], 60 y.o. female, has a history of pain and functional disability in the left hip(s) due to  avascular necrosis  and patient has failed non-surgical conservative treatments for greater than 12 weeks to include NSAID's and/or analgesics and activity modification.  Onset of symptoms was gradual starting 2 years ago with gradually worsening course since that time.The patient noted no past surgery on the left hip(s).  Patient currently rates pain in the left hip at 8 out of 10 with activity. Patient has worsening of pain with activity and weight bearing, pain that interfers with activities of daily living and pain with passive range of motion. Patient has evidence of subchondral cysts and joint space narrowing by imaging studies. This condition presents safety issues increasing the risk of falls. There is no current active infection.  Patient Active Problem List   Diagnosis Date Noted   Preoperative clearance 03/21/2020   Osteoarthritis of left hip 02/19/2020   COVID-19 virus infection 04/18/2019   Urinary frequency 03/10/2019   Restless leg syndrome 03/10/2019   Hyperlipidemia 11/27/2018   Anemia 11/25/2018   Nausea 09/17/2018   Right groin pain 07/16/2018   Depression 03/03/2018   Right shoulder pain 03/03/2018   Pancreatitis 02/22/2018   Alcohol abuse 02/22/2018   Cholelithiasis 02/22/2018   Adhesive capsulitis of right shoulder 01/20/2018   Ventral hernia 09/16/2017   Greater trochanteric bursitis of right hip 04/30/2017   Prediabetes 04/16/2017   Numbness 01/29/2017   Chronic low back pain 07/20/2016   Unilateral primary osteoarthritis, right hip 07/20/2016   Spinal stenosis of lumbar region 11/29/2015   Positive urine drug screen 11/16/2015   Chronic pain syndrome 93/81/0175   Umbilical hernia 01/10/8526   Sleeping difficulties  07/15/2015   Bunion, right 06/20/2015   Muscle spasm 04/16/2015   B12 deficiency 03/30/2015   Hammer toe of left foot 02/16/2015   Fibrosis of skin of lower extremity 02/16/2015   Cervical spondylosis with radiculopathy 03/01/2014   Hammer toe of right foot 01/26/2014   Benign intracranial hypertension 06/18/2013   Cephalalgia 04/21/2013   Porokeratosis 03/31/2013   Vitamin D deficiency 04/18/2010   MEDIAL MENISCUS TEAR, LEFT 06/30/2008   BARRETTS ESOPHAGUS 06/28/2008   Knee osteoarthritis 06/28/2008   OBESITY 05/24/2008   HYPERTENSION, BENIGN ESSENTIAL 05/24/2008   Irritable bowel syndrome 05/24/2008   BARIATRIC SURGERY STATUS 03/19/2006   History of malignant neoplasm of large intestine 03/19/1997   Past Medical History:  Diagnosis Date   Allergy    Arthritis    Back pain    Colon cancer ([place])    Depression    Diarrhea    GERD (gastroesophageal reflux disease)    Hypertension    Insomnia    MS (multiple sclerosis) (HCC)    Other hammer toe (acquired) 03/31/2013   Pneumonia    hx of   Umbilical hernia     Past Surgical History:  Procedure Laterality Date   ABDOMINAL HYSTERECTOMY     ANTERIOR CERVICAL DECOMP/DISCECTOMY FUSION N/A 03/01/2014   Procedure: ANTERIOR CERVICAL DECOMPRESSION/DISCECTOMY FUSION 1 LEVEL;  Surgeon: Charlie Pitter, MD;  Location: [facility];  Service: Neurosurgery;  Laterality: N/A;  ANTERIOR CERVICAL DECOMPRESSION/DISCECTOMY FUSION 1 LEVEL   BUNIONECTOMY Right 10/14/2014   @[facility]    CESAREAN SECTION     COLON SURGERY     COLONOSCOPY     FOOT SURGERY Right  GASTRIC BYPASS     Hammer Toe Repair Right 10/14/2014   RT #2, @[facility]    TENOTOMY Right 10/14/2014   RT #3, @[facility]     Current Outpatient Medications  Medication Sig Dispense Refill Last Dose   acetaminophen (TYLENOL) 650 MG CR tablet Take 1,300 mg by mouth daily as needed for pain.      aspirin EC 81 MG tablet Take 81 mg by mouth daily.      atorvastatin (LIPITOR) 20 MG tablet Take 1 tablet by  mouth once daily (Patient taking differently: Take 20 mg by mouth daily.) 90 tablet 0    cyanocobalamin (,VITAMIN B-12,) 1000 MCG/ML injection Inject 1,000 mcg into the muscle every 30 (thirty) days. Every 30 days, first dose on Thursday      cyclobenzaprine (FLEXERIL) 5 MG tablet Take 5 mg by mouth 3 (three) times daily as needed for muscle spasms.      diclofenac Sodium (VOLTAREN) 1 % GEL APPLY 2 GRAMS TOPICALLY 4 TIMES DAILY (Patient taking differently: Apply 2 g topically 4 (four) times daily as needed (pain).) 100 g 0    docusate sodium (COLACE) 100 MG capsule Take 1 capsule (100 mg total) by mouth 2 (two) times daily as needed for mild constipation. 60 capsule 5    gabapentin (NEURONTIN) 300 MG capsule TAKE 2 CAP BY MOUTH IN THE MORNING AND 2 CAPS IN THE AFTERNOON AND 3 CAPSULES EVERY DAY AT BEDTIME. (Patient taking differently: Take 600-900 mg by mouth See admin instructions. Take 600 mg in the morning, 600 mg in the evening, and 900 mg at bedtime) 210 capsule 5    hydrochlorothiazide (MICROZIDE) 12.5 MG capsule Take 12.5 mg by mouth daily as needed (swelling/high bp).      lisinopril (ZESTRIL) 20 MG tablet Take 1 tablet by mouth once daily (Patient taking differently: Take 20 mg by mouth daily.) 90 tablet 0    meloxicam (MOBIC) 15 MG tablet Take 1 tablet by mouth once daily with food (Patient taking differently: Take 15 mg by mouth daily. WITH FOOD) 90 tablet 0    Multiple Vitamin (MULTIVITAMIN) LIQD Take 30 mLs by mouth daily.      naloxone (NARCAN) nasal spray 4 mg/0.1 mL Place 1 spray into the nose as needed (opioid overdose).      ondansetron (ZOFRAN) 4 MG tablet TAKE 1 TABLET BY MOUTH EVERY 6 HOURS AS NEEDED FOR NAUSEA (Patient taking differently: Take 4 mg by mouth every 6 (six) hours as needed for vomiting or nausea.) 20 tablet 0    Oxycodone HCl 10 MG TABS Take 10 mg by mouth 4 (four) times daily.      Syringe/Needle, Disp, (SYRINGE 3CC/25GX1") 25G X 1" 3 ML MISC Use for monthly B12  injections 3 each 3    tizanidine (ZANAFLEX) 2 MG capsule Take 1 capsule (2 mg total) by mouth at bedtime as needed for muscle spasms. (Patient not taking: No sig reported) 30 capsule 0    triamcinolone cream (KENALOG) 0.5 % Apply 1 application topically daily as needed (rash/dry skin).      VENTOLIN HFA 108 (90 Base) MCG/ACT inhaler Inhale 1-2 puffs into the lungs every 4 (four) hours as needed for wheezing or shortness of breath. 6.7 g 5    XTAMPZA ER 18 MG C12A Take 18 mg by mouth every 12 (twelve) hours.      No current facility-administered medications for this visit.   Allergies  Allergen Reactions   Phenergan [Promethazine Hcl] Anxiety   Trazodone And  Nefazodone Rash    Social History   Tobacco Use   Smoking status: Former Smoker    Packs/day: 0.25    Years: 20.00    Pack years: 5.00    Types: Cigarettes    Quit date: 09/22/2018    Years since quitting: 1.5   Smokeless tobacco: Never Used   Tobacco comment: states she quit 3 months ago  Substance Use Topics   Alcohol use: Yes    Alcohol/week: 0.0 standard drinks    Comment: 1/2 pint daily    Family History  Problem Relation Age of Onset   Stroke Mother    Hypertension Mother    Hyperlipidemia Mother    Diabetes Mother    Diabetes Brother    Hypertension Brother    Hypertension Brother    Hypertension Brother    Hypertension Brother    Hypertension Brother    Alcoholism Brother      Review of Systems  Musculoskeletal: Positive for arthralgias.  All other systems reviewed and are negative.   Objective:  Physical Exam Eyes:     Extraocular Movements: Extraocular movements intact.     Pupils: Pupils are equal, round, and reactive to light.  Cardiovascular:     Rate and Rhythm: Normal rate and regular rhythm.     Pulses: Normal pulses.  Pulmonary:     Breath sounds: Normal breath sounds.  Abdominal:     Palpations: Abdomen is soft.     Tenderness: There is no abdominal tenderness.  Genitourinary:     Comments: Deferred Musculoskeletal:     Cervical back: Normal range of motion.     Comments: Pain left hip with passive ROM  Skin:    General: Skin is warm and dry.  Neurological:     Mental Status: She is alert and oriented to person, place, and time.  Psychiatric:        Mood and Affect: Mood normal.   Swelling in LLE  Vital signs in last 24 hours: @VSRANGES @  Labs:   Estimated body mass index is 39.14 kg/m as calculated from the following:   Height as of 03/21/20: 5\' 4"  (1.626 m).   Weight as of 03/21/20: 103.4 kg.   Imaging Review Plain radiographs demonstrate severe degenerative joint disease of the left hip(s). The bone quality appears to be adequate for age and reported activity level.      Assessment/Plan:  End stage arthritis, left hip(s)  The patient history, physical examination, clinical judgement of the provider and imaging studies are consistent with end stage degenerative joint disease of the left hip(s) and total hip arthroplasty is deemed medically necessary. The treatment options including medical management, injection therapy, arthroscopy and arthroplasty were discussed at length. The risks and benefits of total hip arthroplasty were presented and reviewed. The risks due to aseptic loosening, infection, stiffness, dislocation/subluxation,  thromboembolic complications and other imponderables were discussed.  The patient acknowledged the explanation, agreed to proceed with the plan and consent was signed. Patient is being admitted for inpatient treatment for surgery, pain control, PT, OT, prophylactic antibiotics, VTE prophylaxis, progressive ambulation and ADL's and discharge planning.The patient is planning to be discharged  home

## 2020-04-11 ENCOUNTER — Ambulatory Visit (HOSPITAL_COMMUNITY)
Admission: RE | Admit: 2020-04-11 | Discharge: 2020-04-11 | Disposition: A | Discharge status: Home / Self Care | Payer: Medicare Other | Source: Ambulatory Visit | Attending: Cardiology | Admitting: Cardiology

## 2020-04-11 ENCOUNTER — Other Ambulatory Visit (HOSPITAL_COMMUNITY): Payer: Self-pay | Admitting: Student

## 2020-04-11 ENCOUNTER — Other Ambulatory Visit: Payer: Self-pay

## 2020-04-11 DIAGNOSIS — M79605 Pain in left leg: Secondary | ICD-10-CM | POA: Diagnosis not present

## 2020-04-13 ENCOUNTER — Encounter (HOSPITAL_COMMUNITY): Payer: Self-pay

## 2020-04-13 ENCOUNTER — Encounter (HOSPITAL_COMMUNITY)
Admission: RE | Admit: 2020-04-13 | Discharge: 2020-04-13 | Disposition: A | Discharge status: Home / Self Care | Payer: Medicare Other | Source: Ambulatory Visit | Attending: Orthopedic Surgery | Admitting: Orthopedic Surgery

## 2020-04-13 ENCOUNTER — Other Ambulatory Visit: Payer: Self-pay

## 2020-04-13 DIAGNOSIS — Z01818 Encounter for other preprocedural examination: Secondary | ICD-10-CM | POA: Insufficient documentation

## 2020-04-13 NOTE — Patient Instructions (Signed)
DUE TO COVID-19 ONLY ONE VISITOR IS ALLOWED TO COME WITH YOU AND STAY IN THE WAITING ROOM ONLY DURING PRE OP AND PROCEDURE DAY OF SURGERY. THE 1 VISITOR  MAY VISIT WITH YOU AFTER SURGERY IN YOUR PRIVATE ROOM DURING VISITING HOURS ONLY!  YOU NEED TO HAVE A COVID 19 TEST ON_1/31______ @_12 :05______,  THIS TEST MUST BE DONE BEFORE SURGERY,   COVID TESTING SITE [address] [place] [address],   IT IS ON THE RIGHT GOING OUT [address] APPROXIMATELY  2 MINUTES PAST [employer] ON THE RIGHT. ONCE YOUR COVID TEST IS COMPLETED,  PLEASE BEGIN THE QUARANTINE INSTRUCTIONS AS OUTLINED IN YOUR HANDOUT.                [name]    Your procedure is scheduled on: 04/21/20   Report to [facility] Main  Entrance   Report to Short stay at 5:30 AM     Call this number if you have problems the morning of surgery [place], NO [place].   No food after midnight.   You may have clear liquid until 4:30 AM.    At 4:00 AM drink pre surgery drink.   Nothing by mouth after 4:30 AM.   Take these medicines the morning of surgery with A SIP OF WATER: Gabapentin Use your inhaler if needed and bring it with you                                 You may not have any metal on your body including hair pins and              piercings  Do not wear jewelry, make-up, lotions, powders or perfumes, deodorant             Do not wear nail polish on your fingernails.  Do not shave  48 hours prior to surgery.            Do not bring valuables to the hospital. [place].  Contacts, dentures or bridgework may not be worn into surgery.                   [place] - Preparing for Surgery Before surgery, you can play an important role.   Because skin is not sterile, your skin needs to be as free of germs as possible .  You can reduce the  number of germs on your skin by washing with CHG (chlorahexidine gluconate) soap before surgery.   CHG is an antiseptic cleaner which kills germs and bonds with the skin to continue killing germs even after washing. Please DO NOT use if you have an allergy to CHG or antibacterial soaps .  If your skin becomes reddened/irritated stop using the CHG and inform your nurse when you arrive at Short Stay. Do not shave (including legs and underarms) for at least 48 hours prior to the first CHG shower.    Please follow these instructions carefully:  1.  Shower with CHG Soap the night before surgery and the  morning of Surgery.  2.  If you choose to wash your hair, wash your hair first as usual with your  normal  shampoo.  3.  After you shampoo, rinse your hair and body thoroughly to remove the  shampoo.                                        4.  Use CHG as you would any other liquid soap.  You can apply chg directly  to the skin and wash                       Gently with a scrungie or clean washcloth.  5.  Apply the CHG Soap to your body ONLY FROM THE NECK DOWN.   Do not use on face/ open                           Wound or open sores. Avoid contact with eyes, ears mouth and genitals (private parts).                       Wash face,  Genitals (private parts) with your normal soap.             6.  Wash thoroughly, paying special attention to the area where your surgery  will be performed.  7.  Thoroughly rinse your body with warm water from the neck down.  8.  DO NOT shower/wash with your normal soap after using and rinsing off  the CHG Soap.             9.  Pat yourself dry with a clean towel.            10.  Wear clean pajamas.            11.  Place clean sheets on your bed the night of your first shower and do not  sleep with pets. Day of Surgery : Do not apply any lotions/deodorants the morning of surgery.  Please wear clean clothes to the hospital/surgery center.  FAILURE TO FOLLOW THESE  INSTRUCTIONS MAY RESULT IN THE CANCELLATION OF YOUR SURGERY PATIENT SIGNATURE_________________________________  NURSE SIGNATURE__________________________________  ________________________________________________________________________     Adam Phenix  An incentive spirometer is a tool that can help keep your lungs clear and active. This tool measures how well you are filling your lungs with each breath. Taking long deep breaths may help reverse or decrease the chance of developing breathing (pulmonary) problems (especially infection) following:  A long period of time when you are unable to move or be active. BEFORE THE PROCEDURE   If the spirometer includes an indicator to show your best effort, your nurse or respiratory therapist will set it to a desired goal.  If possible, sit up straight or lean slightly forward. Try not to slouch.  Hold the incentive spirometer in an upright position. INSTRUCTIONS FOR USE  1. Sit on the edge of your bed if possible, or sit up as far as you can in bed or on a chair. 2. Hold the incentive spirometer in an upright position. 3. Breathe out normally. 4. Place the mouthpiece in your mouth and seal your lips tightly around it. 5. Breathe in slowly and as deeply as possible, raising the piston or the ball toward the top of the column. 6. Hold your breath for 3-5 seconds or for as long as possible. Allow the piston or ball to fall to the bottom of the  column. 7. Remove the mouthpiece from your mouth and breathe out normally. 8. Rest for a few seconds and repeat Steps 1 through 7 at least 10 times every 1-2 hours when you are awake. Take your time and take a few normal breaths between deep breaths. 9. The spirometer may include an indicator to show your best effort. Use the indicator as a goal to work toward during each repetition. 10. After each set of 10 deep breaths, practice coughing to be sure your lungs are clear. If you have an incision  (the cut made at the time of surgery), support your incision when coughing by placing a pillow or rolled up towels firmly against it. Once you are able to get out of bed, walk around indoors and cough well. You may stop using the incentive spirometer when instructed by your caregiver.  RISKS AND COMPLICATIONS  Take your time so you do not get dizzy or light-headed.  If you are in pain, you may need to take or ask for pain medication before doing incentive spirometry. It is harder to take a deep breath if you are having pain. AFTER USE  Rest and breathe slowly and easily.  It can be helpful to keep track of a log of your progress. Your caregiver can provide you with a simple table to help with this. If you are using the spirometer at home, follow these instructions: [place] IF:   You are having difficultly using the spirometer.  You have trouble using the spirometer as often as instructed.  Your pain medication is not giving enough relief while using the spirometer.  You develop fever of 100.5 F (38.1 C) or higher. SEEK IMMEDIATE MEDICAL CARE IF:   You cough up bloody sputum that had not been present before.  You develop fever of 102 F (38.9 C) or greater.  You develop worsening pain at or near the incision site. MAKE SURE YOU:   Understand these instructions.  Will watch your condition.  Will get help right away if you are not doing well or get worse. Document Released: 07/16/2006 Document Revised: 05/28/2011 Document Reviewed: 09/16/2006 [facility] Patient Information 2014 [place], [place].   ________________________________________________________________________

## 2020-04-13 NOTE — Progress Notes (Signed)
COVID Vaccine Completed:Yes Date COVID Vaccine completed:07/06/19-Booster 01/12/20 COVID vaccine manufacturer: Pfizer      PCP - Dr. Jenness [place] Cardiologist - none  Chest x-ray - no EKG - 03/21/20-epic Stress Test - no ECHO - no Cardiac Cath - no Pacemaker/ICD device last checked:NA  Sleep Study - NA CPAP -   Fasting Blood Sugar - NA Checks Blood Sugar _____ times a day  Blood Thinner Instructions:ASA 81/Dr. Quay Burow Aspirin Instructions:stop 7 days prior to [employer] Last Dose:05/14/20  Anesthesia review:   Patient denies shortness of breath, fever, cough and chest pain at PAT appointment yes  Patient verbalized understanding of instructions that were given to them at the PAT appointment. Patient was also instructed that they will need to review over the PAT instructions again at home before surgery.Yes. Pt does get SOB cimbing 1 flight of stairs but not doing housework or ADLs. She had been taking meds for MS for 15 yesrs. She was taken off meds and told she didn't have MS. She does have weakness and numbness in her arm and hands sometimes.

## 2020-04-14 ENCOUNTER — Encounter (HOSPITAL_COMMUNITY): Admission: RE | Admit: 2020-04-14 | Payer: Medicare Other | Source: Ambulatory Visit

## 2020-04-16 ENCOUNTER — Other Ambulatory Visit: Payer: Self-pay | Admitting: Internal Medicine

## 2020-04-18 ENCOUNTER — Other Ambulatory Visit (HOSPITAL_COMMUNITY): Payer: Medicare Other

## 2020-04-18 ENCOUNTER — Telehealth: Payer: Self-pay | Admitting: Internal Medicine

## 2020-04-18 NOTE — Telephone Encounter (Signed)
If the swelling is just in the left lower leg and not the right it is typically related to an orthopedic issue.  Did orthopedics thinks it was related to her hip or possibly even her left knee?

## 2020-04-18 NOTE — Telephone Encounter (Signed)
Patient called and said she was having some swelling on her left leg. The surgeon that was supposed to do her left hip replacement sent her for an ultrasound came back negative for a blood clot. She said that the swelling is on the side she is supposed to have her hip replacement.

## 2020-04-19 ENCOUNTER — Other Ambulatory Visit: Payer: Self-pay

## 2020-04-19 NOTE — Telephone Encounter (Signed)
I do not recall the swelling being that bad - has it gotten worse?  Why doesn't she come in and we can see what we can do.

## 2020-04-19 NOTE — Telephone Encounter (Signed)
Spoke with patient today. Due to limited transportation she will call back if swelling does come back or get worse. She said as of now she is not having any swelling and is staying off her leg as much as possible.

## 2020-04-19 NOTE — Progress Notes (Deleted)
Subjective:    Patient ID: Misty [name], female    DOB: Oct 13, 1960, 60 y.o.   MRN: [id number]  HPI The patient is here for an acute visit.  Left leg swelling - she had an US done by ortho and it was negative.     Medications and allergies reviewed with patient and updated if appropriate.  Patient Active Problem List   Diagnosis Date Noted  . Preoperative clearance 03/21/2020  . Osteoarthritis of left hip 02/19/2020  . COVID-19 virus infection 04/18/2019  . Urinary frequency 03/10/2019  . Restless leg syndrome 03/10/2019  . Hyperlipidemia 11/27/2018  . Anemia 11/25/2018  . Nausea 09/17/2018  . Right groin pain 07/16/2018  . Depression 03/03/2018  . Right shoulder pain 03/03/2018  . Pancreatitis 02/22/2018  . Alcohol abuse 02/22/2018  . Cholelithiasis 02/22/2018  . Adhesive capsulitis of right shoulder 01/20/2018  . Ventral hernia 09/16/2017  . Greater trochanteric bursitis of right hip 04/30/2017  . Prediabetes 04/16/2017  . Numbness 01/29/2017  . Chronic low back pain 07/20/2016  . Unilateral primary osteoarthritis, right hip 07/20/2016  . Spinal stenosis of lumbar region 11/29/2015  . Positive urine drug screen 11/16/2015  . Chronic pain syndrome 10/12/2015  . Umbilical hernia [id number]  . Sleeping difficulties 07/15/2015  . Bunion, right 06/20/2015  . Muscle spasm 04/16/2015  . B12 deficiency 03/30/2015  . Hammer toe of left foot 02/16/2015  . Fibrosis of skin of lower extremity 02/16/2015  . Cervical spondylosis with radiculopathy 03/01/2014  . Hammer toe of right foot 01/26/2014  . Benign intracranial hypertension 06/18/2013  . Cephalalgia 04/21/2013  . Porokeratosis 03/31/2013  . Vitamin D deficiency 04/18/2010  . MEDIAL MENISCUS TEAR, LEFT 06/30/2008  . BARRETTS ESOPHAGUS 06/28/2008  . Knee osteoarthritis 06/28/2008  . OBESITY 05/24/2008  . HYPERTENSION, BENIGN ESSENTIAL 05/24/2008  . Irritable bowel syndrome 05/24/2008  . BARIATRIC SURGERY  STATUS 03/19/2006  . History of malignant neoplasm of large intestine 03/19/1997    Current Outpatient Medications on File Prior to Visit  Medication Sig Dispense Refill  . acetaminophen (TYLENOL) 650 MG CR tablet Take 1,300 mg by mouth daily as needed for pain.    Marland Kitchen aspirin EC 81 MG tablet Take 81 mg by mouth daily.    Marland Kitchen atorvastatin (LIPITOR) 20 MG tablet Take 1 tablet by mouth once daily (Patient taking differently: Take 20 mg by mouth daily.) 90 tablet 0  . cyanocobalamin (,VITAMIN B-12,) 1000 MCG/ML injection Inject 1,000 mcg into the muscle every 30 (thirty) days. Every 30 days, first dose on Thursday    . cyclobenzaprine (FLEXERIL) 5 MG tablet Take 5 mg by mouth 3 (three) times daily as needed for muscle spasms.    . diclofenac Sodium (VOLTAREN) 1 % GEL APPLY 2 GRAMS TOPICALLY 4 TIMES DAILY (Patient taking differently: Apply 2 g topically 4 (four) times daily as needed (pain).) 100 g 0  . docusate sodium (COLACE) 100 MG capsule Take 1 capsule (100 mg total) by mouth 2 (two) times daily as needed for mild constipation. 60 capsule 5  . gabapentin (NEURONTIN) 300 MG capsule TAKE 2 CAPSULES BY MOUTH IN THE MORNING AND  2  CAPSULES  IN  THE  AFTERNOON  AND  3  CAPSULES  EVERY  DAY  AT  BEDTIME 210 capsule 0  . hydrochlorothiazide (MICROZIDE) 12.5 MG capsule Take 12.5 mg by mouth daily as needed (swelling/high bp).    . lisinopril (ZESTRIL) 20 MG tablet Take 1 tablet by mouth once daily (Patient  taking differently: Take 20 mg by mouth daily.) 90 tablet 0  . meloxicam (MOBIC) 15 MG tablet Take 1 tablet by mouth once daily with food 90 tablet 0  . Multiple Vitamin (MULTIVITAMIN) LIQD Take 30 mLs by mouth daily.    . naloxone (NARCAN) nasal spray 4 mg/0.1 mL Place 1 spray into the nose as needed (opioid overdose).    . ondansetron (ZOFRAN) 4 MG tablet TAKE 1 TABLET BY MOUTH EVERY 6 HOURS AS NEEDED FOR NAUSEA 20 tablet 0  . Oxycodone HCl 10 MG TABS Take 10 mg by mouth 4 (four) times daily.    .  Syringe/Needle, Disp, (SYRINGE 3CC/25GX1") 25G X 1" 3 ML MISC Use for monthly B12 injections 3 each 3  . tizanidine (ZANAFLEX) 2 MG capsule Take 1 capsule (2 mg total) by mouth at bedtime as needed for muscle spasms. (Patient not taking: No sig reported) 30 capsule 0  . triamcinolone cream (KENALOG) 0.5 % Apply 1 application topically daily as needed (rash/dry skin).    . VENTOLIN HFA 108 (90 Base) MCG/ACT inhaler Inhale 1-2 puffs into the lungs every 4 (four) hours as needed for wheezing or shortness of breath. 6.7 g 5  . XTAMPZA ER 18 MG C12A Take 18 mg by mouth every 12 (twelve) hours.     No current facility-administered medications on file prior to visit.    Past Medical History:  Diagnosis Date  . Allergy   . Arthritis   . Back pain   . Colon cancer ([place])   . Depression   . Diarrhea   . GERD (gastroesophageal reflux disease)   . Hypertension   . Insomnia   . MS (multiple sclerosis) ([place]) 2000   pt says that she doesn't have it  . Other hammer toe (acquired) 03/31/2013  . Pneumonia    hx of  . Umbilical hernia     Past Surgical History:  Procedure Laterality Date  . ABDOMINAL HYSTERECTOMY    . ANTERIOR CERVICAL DECOMP/DISCECTOMY FUSION N/A 03/01/2014   Procedure: ANTERIOR CERVICAL DECOMPRESSION/DISCECTOMY FUSION 1 LEVEL;  Surgeon: Charlie Pitter, MD;  Location: [facility];  Service: Neurosurgery;  Laterality: N/A;  ANTERIOR CERVICAL DECOMPRESSION/DISCECTOMY FUSION 1 LEVEL  . BUNIONECTOMY Right 10/14/2014   @[facility]   . CESAREAN SECTION    . COLON SURGERY    . COLONOSCOPY    . FOOT SURGERY Right   . GASTRIC BYPASS    . Hammer Toe Repair Right 10/14/2014   RT #2, @[facility]   . TENOTOMY Right 10/14/2014   RT #3, @[facility]     Social History   Socioeconomic History  . Marital status: Single    Spouse name: Not on file  . Number of children: 2  . Years of education: Not on file  . Highest education level: Not on file  Occupational History  . Occupation: Disability  Tobacco Use  .  Smoking status: Former Smoker    Packs/day: 0.25    Years: 20.00    Pack years: 5.00    Types: Cigarettes    Quit date: 09/22/2018    Years since quitting: 1.5  . Smokeless tobacco: Never Used  . Tobacco comment: states she quit 3 months ago  Vaping Use  . Vaping Use: Never used  Substance and Sexual Activity  . Alcohol use: Yes    Alcohol/week: 0.0 standard drinks    Comment: 1/2 pint daily  . Drug use: No  . Sexual activity: Yes    Partners: Male  Other Topics Concern  .  Not on file  Social History Narrative  . Not on file   Social Determinants of Health   Financial Resource Strain: Low Risk   . Difficulty of Paying Living Expenses: Not hard at all  Food Insecurity: Not on file  Transportation Needs: Not on file  Physical Activity: Not on file  Stress: Not on file  Social Connections: Not on file    Family History  Problem Relation Age of Onset  . Stroke Mother   . Hypertension Mother   . Hyperlipidemia Mother   . Diabetes Mother   . Diabetes Brother   . Hypertension Brother   . Hypertension Brother   . Hypertension Brother   . Hypertension Brother   . Hypertension Brother   . Alcoholism Brother     Review of Systems     Objective:  There were no vitals filed for this visit. BP Readings from Last 3 Encounters:  03/21/20 132/82  02/19/20 130/72  02/04/20 (!) 154/80   Wt Readings from Last 3 Encounters:  04/13/20 227 lb (103 kg)  03/21/20 228 lb (103.4 kg)  02/19/20 250 lb 9.6 oz (113.7 kg)   There is no height or weight on file to calculate BMI.   Physical Exam         Assessment & Plan:    See Problem List for Assessment and Plan of chronic medical problems.    This visit occurred during the SARS-CoV-2 public health emergency.  Safety protocols were in place, including screening questions prior to the visit, additional usage of staff PPE, and extensive cleaning of exam room while observing appropriate contact time as indicated for  disinfecting solutions.

## 2020-04-20 ENCOUNTER — Ambulatory Visit: Payer: Medicare Other | Admitting: Internal Medicine

## 2020-04-22 NOTE — Progress Notes (Addendum)
COVID Vaccine Completed:  x3 Date COVID Vaccine completed:  Booster 11-21 COVID vaccine manufacturer: Pfizer     PCP - Billey Gosling, MD Cardiologist - N/A  Chest x-ray - N/A EKG - 03-21-20 in Epic Stress Test -  ECHO -  Cardiac Cath -  Pacemaker/ICD device last checked:  Sleep Study - N/A CPAP -   Fasting Blood Sugar - N/A Checks Blood Sugar _____ times a day  Blood Thinner Instructions: Aspirin Instructions:  ASA 81 mg  Last Dose:  1/22  Anesthesia review:   Patient denies shortness of breath, fever, cough and chest pain at PAT appointment  Activity:  Patient cannot climb stairs due to hip pain.  Is able to do housework and ADLs independently.   Patient verbalized understanding of instructions that were given to them at the PAT appointment. Patient was also instructed that they will need to review over the PAT instructions again at home before surgery.

## 2020-04-22 NOTE — Patient Instructions (Addendum)
DUE TO COVID-19 ONLY ONE VISITOR IS ALLOWED TO COME WITH YOU AND STAY IN THE WAITING ROOM ONLY DURING PRE OP AND PROCEDURE.   IF YOU WILL BE ADMITTED INTO THE HOSPITAL YOU ARE ALLOWED ONE SUPPORT PERSON DURING VISITATION HOURS ONLY (10AM -8PM)   . The support person may change daily. . The support person must pass our screening, gel in and out, and wear a mask at all times, including in the patient's room. . Patients must also wear a mask when staff or their support person are in the room.   COVID SWAB TESTING MUST BE COMPLETED ON:   Monday, 04-25-20 @ 2:35 PM   [address] [place], [place] [address]  (Must self quarantine after testing. Follow instructions on handout.)   Your procedure is scheduled on: Thursday, 04-28-20   Report to [facility] Main  Entrance    Report to admitting at 10:20 AM   Call this number if you have problems the morning of surgery [phone]   Do not eat food :After Midnight.   May have liquids until 9:45 AM day of surgery  CLEAR LIQUID DIET  Foods Allowed                                                                     Foods Excluded  Water, Black Coffee and tea, regular and decaf            liquids that you cannot  Plain Jell-O in any flavor  (No red)                                   see through such as: Fruit ices (not with fruit pulp)                                           milk, soups, orange juice              Iced Popsicles (No red)                                               All solid food                                   Apple juices Sports drinks like Gatorade (No red) Lightly seasoned clear broth or consume(fat free) Sugar, honey syrup     Complete one Ensure drink the morning of surgery at 9:45 AM  the day of surgery.      1. The day of surgery:  ? Drink ONE (1) Pre-Surgery Clear Ensure or G2 by am the morning of surgery. Drink in one sitting. Do not sip.  ? This drink was given to you during your hospital  pre-op  appointment visit. ? Nothing else to drink after completing the  Pre-Surgery Clear Ensure or G2.  If you have questions, please contact your surgeon's office.     Oral Hygiene is also important to reduce your risk of infection.                                    Remember - BRUSH YOUR TEETH THE MORNING OF SURGERY WITH YOUR REGULAR TOOTHPASTE   Do NOT smoke after Midnight   Take these medicines the morning of surgery with A SIP OF WATER:  Atorvastatin, Gabapentin, Xtampza, okay to use inhalers and bring with y ou day of  surgery                               You may not have any metal on your body including hair pins, jewelry, and body piercings             Do not wear make-up, lotions, powders, perfumes/cologne, or deodorant             Do not wear nail polish.  Do not shave  48 hours prior to surgery.    Do not bring valuables to the hospital. Misty [name].   Contacts, dentures or bridgework may not be worn into surgery.   Bring small overnight bag day of surgery.                 Please read over the following fact sheets you were given: IF YOU HAVE QUESTIONS ABOUT YOUR PRE OP INSTRUCTIONS PLEASE  CALL [facility] - Preparing for Surgery Before surgery, you can play an important role.  Because skin is not sterile, your skin needs to be as free of germs as possible.  You can reduce the number of germs on your skin by washing with CHG (chlorahexidine gluconate) soap before surgery.  CHG is an antiseptic cleaner which kills germs and bonds with the skin to continue killing germs even after washing. Please DO NOT use if you have an allergy to CHG or antibacterial soaps.  If your skin becomes reddened/irritated stop using the CHG and inform your nurse when you arrive at Short Stay. Do not shave (including legs and underarms) for at least 48 hours prior to the first CHG shower.  You may shave your face/neck.  Please follow  these instructions carefully:  1.  Shower with CHG Soap the night before surgery and the  morning of surgery.  2.  If you choose to wash your hair, wash your hair first as usual with your normal  shampoo.  3.  After you shampoo, rinse your hair and body thoroughly to remove the shampoo.                             4.  Use CHG as you would any other liquid soap.  You can apply chg directly to the skin and wash.  Gently with a scrungie or clean washcloth.  5.  Apply the CHG Soap to your body ONLY FROM THE NECK DOWN.   Do   not use on face/ open                           Wound or open sores. Avoid contact with eyes, ears mouth and  genitals (private parts).                       Wash face,  Genitals (private parts) with your normal soap.             6.  Wash thoroughly, paying special attention to the area where your    surgery  will be performed.  7.  Thoroughly rinse your body with warm water from the neck down.  8.  DO NOT shower/wash with your normal soap after using and rinsing off the CHG Soap.                9.  Pat yourself dry with a clean towel.            10.  Wear clean pajamas.            11.  Place clean sheets on your bed the night of your first shower and do not  sleep with pets. Day of Surgery : Do not apply any lotions/deodorants the morning of surgery.  Please wear clean clothes to the hospital/surgery center.  FAILURE TO FOLLOW THESE INSTRUCTIONS MAY RESULT IN THE CANCELLATION OF YOUR SURGERY  PATIENT SIGNATURE_________________________________  NURSE SIGNATURE__________________________________  ________________________________________________________________________   Misty [name]  An incentive spirometer is a tool that can help keep your lungs clear and active. This tool measures how well you are filling your lungs with each breath. Taking long deep breaths may help reverse or decrease the chance of developing breathing (pulmonary) problems (especially  infection) following:  A long period of time when you are unable to move or be active. BEFORE THE PROCEDURE   If the spirometer includes an indicator to show your best effort, your nurse or respiratory therapist will set it to a desired goal.  If possible, sit up straight or lean slightly forward. Try not to slouch.  Hold the incentive spirometer in an upright position. INSTRUCTIONS FOR USE  1. Sit on the edge of your bed if possible, or sit up as far as you can in bed or on a chair. 2. Hold the incentive spirometer in an upright position. 3. Breathe out normally. 4. Place the mouthpiece in your mouth and seal your lips tightly around it. 5. Breathe in slowly and as deeply as possible, raising the piston or the ball toward the top of the column. 6. Hold your breath for 3-5 seconds or for as long as possible. Allow the piston or ball to fall to the bottom of the column. 7. Remove the mouthpiece from your mouth and breathe out normally. 8. Rest for a few seconds and repeat Steps 1 through 7 at least 10 times every 1-2 hours when you are awake. Take your time and take a few normal breaths between deep breaths. 9. The spirometer may include an indicator to show your best effort. Use the indicator as a goal to work toward during each repetition. 10. After each set of 10 deep breaths, practice coughing to be sure your lungs are clear. If you have an incision (the cut made at the time of surgery), support your incision when coughing by placing a pillow or rolled up towels firmly against it. Once you are able to get out of bed, walk around indoors and cough well. You may stop using the incentive spirometer when instructed by your caregiver.  RISKS AND COMPLICATIONS  Take your time so you do not get dizzy or light-headed.  If you are in  pain, you may need to take or ask for pain medication before doing incentive spirometry. It is harder to take a deep breath if you are having pain. AFTER  USE  Rest and breathe slowly and easily.  It can be helpful to keep track of a log of your progress. Your caregiver can provide you with a simple table to help with this. If you are using the spirometer at home, follow these instructions: [place] IF:   You are having difficultly using the spirometer.  You have trouble using the spirometer as often as instructed.  Your pain medication is not giving enough relief while using the spirometer.  You develop fever of 100.5 F (38.1 C) or higher. SEEK IMMEDIATE MEDICAL CARE IF:   You cough up bloody sputum that had not been present before.  You develop fever of 102 F (38.9 C) or greater.  You develop worsening pain at or near the incision site. MAKE SURE YOU:   Understand these instructions.  Will watch your condition.  Will get help right away if you are not doing well or get worse. Document Released: 07/16/2006 Document Revised: 05/28/2011 Document Reviewed: 09/16/2006 Exit[employer] Patient Information 2014 Exit[employer], [place].   ________________________________________________________________________  WHAT IS A BLOOD TRANSFUSION? Blood Transfusion Information  A transfusion is the replacement of blood or some of its parts. Blood is made up of multiple cells which provide different functions.  Red blood cells carry oxygen and are used for blood loss replacement.  White blood cells fight against infection.  Platelets control bleeding.  Plasma helps clot blood.  Other blood products are available for specialized needs, such as hemophilia or other clotting disorders. BEFORE THE TRANSFUSION  Who gives blood for transfusions?   Healthy volunteers who are fully evaluated to make sure their blood is safe. This is blood bank blood. Transfusion therapy is the safest it has ever been in the practice of medicine. Before blood is taken from a donor, a complete history is taken to make sure that person has no history of diseases  nor engages in risky social behavior (examples are intravenous drug use or sexual activity with multiple partners). The donor's travel history is screened to minimize risk of transmitting infections, such as malaria. The donated blood is tested for signs of infectious diseases, such as HIV and hepatitis. The blood is then tested to be sure it is compatible with you in order to minimize the chance of a transfusion reaction. If you or a relative donates blood, this is often done in anticipation of surgery and is not appropriate for emergency situations. It takes many days to process the donated blood. RISKS AND COMPLICATIONS Although transfusion therapy is very safe and saves many lives, the main dangers of transfusion include:   Getting an infectious disease.  Developing a transfusion reaction. This is an allergic reaction to something in the blood you were given. Every precaution is taken to prevent this. The decision to have a blood transfusion has been considered carefully by your caregiver before blood is given. Blood is not given unless the benefits outweigh the risks. AFTER THE TRANSFUSION  Right after receiving a blood transfusion, you will usually feel much better and more energetic. This is especially true if your red blood cells have gotten low (anemic). The transfusion raises the level of the red blood cells which carry oxygen, and this usually causes an energy increase.  The nurse administering the transfusion will monitor you carefully for complications. HOME CARE INSTRUCTIONS  No special instructions are needed after a transfusion. You may find your energy is better. Speak with your caregiver about any limitations on activity for underlying diseases you may have. SEEK MEDICAL CARE IF:   Your condition is not improving after your transfusion.  You develop redness or irritation at the intravenous (IV) site. SEEK IMMEDIATE MEDICAL CARE IF:  Any of the following symptoms occur over the  next 12 hours:  Shaking chills.  You have a temperature by mouth above 102 F (38.9 C), not controlled by medicine.  Chest, back, or muscle pain.  People around you feel you are not acting correctly or are confused.  Shortness of breath or difficulty breathing.  Dizziness and fainting.  You get a rash or develop hives.  You have a decrease in urine output.  Your urine turns a dark color or changes to pink, red, or brown. Any of the following symptoms occur over the next 10 days:  You have a temperature by mouth above 102 F (38.9 C), not controlled by medicine.  Shortness of breath.  Weakness after normal activity.  The white part of the eye turns yellow (jaundice).  You have a decrease in the amount of urine or are urinating less often.  Your urine turns a dark color or changes to pink, red, or brown. Document Released: 03/02/2000 Document Revised: 05/28/2011 Document Reviewed: 10/20/2007 [facility] Patient Information 2014 [place], [place].  _______________________________________________________________________

## 2020-04-23 ENCOUNTER — Other Ambulatory Visit: Payer: Self-pay | Admitting: Internal Medicine

## 2020-04-23 DIAGNOSIS — M25551 Pain in right hip: Secondary | ICD-10-CM | POA: Diagnosis not present

## 2020-04-23 DIAGNOSIS — G8929 Other chronic pain: Secondary | ICD-10-CM | POA: Diagnosis not present

## 2020-04-25 ENCOUNTER — Other Ambulatory Visit (HOSPITAL_COMMUNITY)
Admission: RE | Admit: 2020-04-25 | Discharge: 2020-04-25 | Disposition: A | Discharge status: Home / Self Care | Payer: Medicare Other | Source: Ambulatory Visit | Attending: Orthopedic Surgery | Admitting: Orthopedic Surgery

## 2020-04-25 ENCOUNTER — Other Ambulatory Visit: Payer: Self-pay

## 2020-04-25 ENCOUNTER — Encounter (HOSPITAL_COMMUNITY): Payer: Self-pay

## 2020-04-25 ENCOUNTER — Encounter (HOSPITAL_COMMUNITY)
Admission: RE | Admit: 2020-04-25 | Discharge: 2020-04-25 | Disposition: A | Discharge status: Home / Self Care | Payer: Medicare Other | Source: Ambulatory Visit | Attending: Orthopedic Surgery | Admitting: Orthopedic Surgery

## 2020-04-25 DIAGNOSIS — Z20822 Contact with and (suspected) exposure to covid-19: Secondary | ICD-10-CM | POA: Insufficient documentation

## 2020-04-25 DIAGNOSIS — Z01812 Encounter for preprocedural laboratory examination: Secondary | ICD-10-CM | POA: Insufficient documentation

## 2020-04-25 LAB — URINALYSIS, ROUTINE W REFLEX MICROSCOPIC
Bilirubin Urine: NEGATIVE
Glucose, UA: NEGATIVE mg/dL
Hgb urine dipstick: NEGATIVE
Ketones, ur: NEGATIVE mg/dL
Nitrite: NEGATIVE
Protein, ur: NEGATIVE mg/dL
Specific Gravity, Urine: 1.023 (ref 1.005–1.030)
pH: 5 (ref 5.0–8.0)

## 2020-04-25 LAB — COMPREHENSIVE METABOLIC PANEL
ALT: 17 U/L (ref 0–44)
AST: 20 U/L (ref 15–41)
Albumin: 3.9 g/dL (ref 3.5–5.0)
Alkaline Phosphatase: 100 U/L (ref 38–126)
Anion gap: 13 (ref 5–15)
BUN: 13 mg/dL (ref 6–20)
CO2: 25 mmol/L (ref 22–32)
Calcium: 9.9 mg/dL (ref 8.9–10.3)
Chloride: 103 mmol/L (ref 98–111)
Creatinine, Ser: 0.81 mg/dL (ref 0.44–1.00)
GFR, Estimated: >60 mL/min (ref >60)
Glucose, Bld: 95 mg/dL (ref 70–99)
Potassium: 4.6 mmol/L (ref 3.5–5.1)
Sodium: 141 mmol/L (ref 135–145)
Total Bilirubin: 1.1 mg/dL (ref 0.3–1.2)
Total Protein: 7.6 g/dL (ref 6.5–8.1)

## 2020-04-25 LAB — PROTIME-INR
INR: 1.1 (ref 0.8–1.2)
Prothrombin Time: 13.6 seconds (ref 11.4–15.2)

## 2020-04-25 LAB — CBC
HCT: 37.5 % (ref 36.0–46.0)
Hemoglobin: 11.5 g/dL — ABNORMAL LOW (ref 12.0–15.0)
MCH: 30.1 pg (ref 26.0–34.0)
MCHC: 30.7 g/dL (ref 30.0–36.0)
MCV: 98.2 fL (ref 80.0–100.0)
Platelets: 241 10*3/uL (ref 150–400)
RBC: 3.82 MIL/uL — ABNORMAL LOW (ref 3.87–5.11)
RDW: 14.5 % (ref 11.5–15.5)
WBC: 7.3 10*3/uL (ref 4.0–10.5)
nRBC: 0 % (ref 0.0–0.2)

## 2020-04-25 LAB — SURGICAL PCR SCREEN
MRSA, PCR: NEGATIVE
Staphylococcus aureus: NEGATIVE

## 2020-04-26 ENCOUNTER — Other Ambulatory Visit: Payer: Self-pay | Admitting: Internal Medicine

## 2020-04-26 LAB — SARS CORONAVIRUS 2 (TAT 6-24 HRS): SARS Coronavirus 2: NEGATIVE

## 2020-04-27 ENCOUNTER — Other Ambulatory Visit: Payer: Self-pay | Admitting: Internal Medicine

## 2020-04-28 ENCOUNTER — Ambulatory Visit (HOSPITAL_COMMUNITY): Payer: Medicare Other | Admitting: Certified Registered Nurse Anesthetist

## 2020-04-28 ENCOUNTER — Telehealth: Payer: Self-pay | Admitting: Internal Medicine

## 2020-04-28 ENCOUNTER — Other Ambulatory Visit: Payer: Self-pay

## 2020-04-28 ENCOUNTER — Ambulatory Visit (HOSPITAL_COMMUNITY): Payer: Medicare Other

## 2020-04-28 ENCOUNTER — Encounter (HOSPITAL_COMMUNITY): Payer: Self-pay | Admitting: Orthopedic Surgery

## 2020-04-28 ENCOUNTER — Encounter (HOSPITAL_COMMUNITY)
Admission: RE | Disposition: A | Discharge status: Home / Self Care | Payer: Self-pay | Source: Home / Self Care | Attending: Orthopedic Surgery

## 2020-04-28 ENCOUNTER — Observation Stay (HOSPITAL_COMMUNITY)
Admission: RE | Admit: 2020-04-28 | Discharge: 2020-04-30 | Disposition: A | Discharge status: Home / Self Care | Payer: Medicare Other | Attending: Orthopedic Surgery | Admitting: Orthopedic Surgery

## 2020-04-28 DIAGNOSIS — Z87891 Personal history of nicotine dependence: Secondary | ICD-10-CM | POA: Insufficient documentation

## 2020-04-28 DIAGNOSIS — M87052 Idiopathic aseptic necrosis of left femur: Secondary | ICD-10-CM | POA: Diagnosis present

## 2020-04-28 DIAGNOSIS — M8568 Other cyst of bone, other site: Secondary | ICD-10-CM | POA: Insufficient documentation

## 2020-04-28 DIAGNOSIS — Z09 Encounter for follow-up examination after completed treatment for conditions other than malignant neoplasm: Secondary | ICD-10-CM

## 2020-04-28 DIAGNOSIS — M25552 Pain in left hip: Secondary | ICD-10-CM | POA: Diagnosis present

## 2020-04-28 DIAGNOSIS — E119 Type 2 diabetes mellitus without complications: Secondary | ICD-10-CM | POA: Insufficient documentation

## 2020-04-28 DIAGNOSIS — I10 Essential (primary) hypertension: Secondary | ICD-10-CM | POA: Insufficient documentation

## 2020-04-28 DIAGNOSIS — Z791 Long term (current) use of non-steroidal anti-inflammatories (NSAID): Secondary | ICD-10-CM | POA: Insufficient documentation

## 2020-04-28 DIAGNOSIS — Z9489 Other transplanted organ and tissue status: Secondary | ICD-10-CM | POA: Diagnosis not present

## 2020-04-28 DIAGNOSIS — M160 Bilateral primary osteoarthritis of hip: Secondary | ICD-10-CM | POA: Diagnosis not present

## 2020-04-28 DIAGNOSIS — Z419 Encounter for procedure for purposes other than remedying health state, unspecified: Secondary | ICD-10-CM

## 2020-04-28 DIAGNOSIS — Z7984 Long term (current) use of oral hypoglycemic drugs: Secondary | ICD-10-CM | POA: Insufficient documentation

## 2020-04-28 DIAGNOSIS — Z96642 Presence of left artificial hip joint: Secondary | ICD-10-CM | POA: Diagnosis not present

## 2020-04-28 DIAGNOSIS — Z471 Aftercare following joint replacement surgery: Secondary | ICD-10-CM | POA: Diagnosis not present

## 2020-04-28 DIAGNOSIS — M1612 Unilateral primary osteoarthritis, left hip: Secondary | ICD-10-CM | POA: Diagnosis not present

## 2020-04-28 DIAGNOSIS — D849 Immunodeficiency, unspecified: Secondary | ICD-10-CM | POA: Diagnosis not present

## 2020-04-28 DIAGNOSIS — M87852 Other osteonecrosis, left femur: Secondary | ICD-10-CM | POA: Diagnosis not present

## 2020-04-28 DIAGNOSIS — Z79899 Other long term (current) drug therapy: Secondary | ICD-10-CM | POA: Diagnosis not present

## 2020-04-28 DIAGNOSIS — E559 Vitamin D deficiency, unspecified: Secondary | ICD-10-CM | POA: Diagnosis not present

## 2020-04-28 HISTORY — PX: TOTAL HIP ARTHROPLASTY: SHX124

## 2020-04-28 LAB — ABO/RH: ABO/RH(D): O POS

## 2020-04-28 LAB — TYPE AND SCREEN
ABO/RH(D): O POS
Antibody Screen: NEGATIVE

## 2020-04-28 SURGERY — ARTHROPLASTY, HIP, TOTAL, ANTERIOR APPROACH
Anesthesia: Spinal | Site: Hip | Laterality: Left

## 2020-04-28 MED ORDER — FENTANYL CITRATE (PF) 100 MCG/2ML IJ SOLN
INTRAMUSCULAR | Status: AC
  Filled 2020-04-28: qty 2

## 2020-04-28 MED ORDER — ORAL CARE MOUTH RINSE: 15.0000 mL | Freq: Once | OROMUCOSAL | Status: AC

## 2020-04-28 MED ORDER — SENNA 8.6 MG PO TABS
1.0000 | ORAL_TABLET | Freq: Two times a day (BID) | ORAL | Status: DC
  Administered 2020-04-28 – 2020-04-30 (×4): 8.6 mg via ORAL
  Filled 2020-04-28 (×4): qty 1

## 2020-04-28 MED ORDER — PROPOFOL 1000 MG/100ML IV EMUL
INTRAVENOUS | Status: AC
  Filled 2020-04-28: qty 100

## 2020-04-28 MED ORDER — ADULT MULTIVITAMIN LIQUID CH
15.0000 mL | Freq: Every day | ORAL | Status: DC
  Administered 2020-04-29 – 2020-04-30 (×2): 15 mL via ORAL
  Filled 2020-04-28 (×2): qty 15

## 2020-04-28 MED ORDER — ISOPROPYL ALCOHOL 70 % SOLN
Status: DC | PRN
  Administered 2020-04-28: 1 via TOPICAL

## 2020-04-28 MED ORDER — NALOXONE HCL 4 MG/0.1ML NA LIQD
1.0000 | NASAL | Status: DC | PRN
  Filled 2020-04-28: qty 8

## 2020-04-28 MED ORDER — ALBUTEROL SULFATE HFA 108 (90 BASE) MCG/ACT IN AERS: 1.0000 | INHALATION_SPRAY | Freq: Four times a day (QID) | RESPIRATORY_TRACT | Status: DC | PRN

## 2020-04-28 MED ORDER — GABAPENTIN 300 MG PO CAPS
600.0000 mg | ORAL_CAPSULE | Freq: Three times a day (TID) | ORAL | Status: DC
  Administered 2020-04-28 – 2020-04-30 (×6): 600 mg via ORAL
  Filled 2020-04-28 (×6): qty 2

## 2020-04-28 MED ORDER — POVIDONE-IODINE 10 % EX SWAB
2.0000 "application " | Freq: Once | CUTANEOUS | Status: AC
  Administered 2020-04-28: 2 via TOPICAL

## 2020-04-28 MED ORDER — ALUM & MAG HYDROXIDE-SIMETH 200-200-20 MG/5ML PO SUSP: 30.0000 mL | ORAL | Status: DC | PRN

## 2020-04-28 MED ORDER — IRRISEPT - 450ML BOTTLE WITH 0.05% CHG IN STERILE WATER, USP 99.95% OPTIME
TOPICAL | Status: DC | PRN
  Administered 2020-04-28: 450 mL

## 2020-04-28 MED ORDER — SODIUM CHLORIDE (PF) 0.9 % IJ SOLN
INTRAMUSCULAR | Status: DC | PRN
  Administered 2020-04-28: 30 mL

## 2020-04-28 MED ORDER — METOCLOPRAMIDE HCL 5 MG/ML IJ SOLN: 5.0000 mg | Freq: Three times a day (TID) | INTRAMUSCULAR | Status: DC | PRN

## 2020-04-28 MED ORDER — POVIDONE-IODINE 10 % EX SWAB: 2.0000 "application " | Freq: Once | CUTANEOUS | Status: DC

## 2020-04-28 MED ORDER — TRANEXAMIC ACID-NACL 1000-0.7 MG/100ML-% IV SOLN
1000.0000 mg | INTRAVENOUS | Status: AC
  Administered 2020-04-28: 1000 mg via INTRAVENOUS
  Filled 2020-04-28: qty 100

## 2020-04-28 MED ORDER — OXYCODONE HCL ER 20 MG PO T12A
20.0000 mg | EXTENDED_RELEASE_TABLET | Freq: Two times a day (BID) | ORAL | Status: DC
  Administered 2020-04-28 – 2020-04-30 (×4): 20 mg via ORAL
  Filled 2020-04-28 (×4): qty 1

## 2020-04-28 MED ORDER — TRANEXAMIC ACID-NACL 1000-0.7 MG/100ML-% IV SOLN
1000.0000 mg | Freq: Once | INTRAVENOUS | Status: AC
  Administered 2020-04-28: 1000 mg via INTRAVENOUS
  Filled 2020-04-28: qty 100

## 2020-04-28 MED ORDER — PHENYLEPHRINE HCL (PRESSORS) 10 MG/ML IV SOLN
INTRAVENOUS | Status: DC | PRN
  Administered 2020-04-28 (×2): 80 ug via INTRAVENOUS

## 2020-04-28 MED ORDER — ONDANSETRON HCL 4 MG/2ML IJ SOLN: 4.0000 mg | Freq: Four times a day (QID) | INTRAMUSCULAR | Status: DC | PRN

## 2020-04-28 MED ORDER — ONDANSETRON HCL 4 MG/2ML IJ SOLN
INTRAMUSCULAR | Status: DC | PRN
  Administered 2020-04-28: 4 mg via INTRAVENOUS

## 2020-04-28 MED ORDER — METOCLOPRAMIDE HCL 5 MG PO TABS: 5.0000 mg | ORAL_TABLET | Freq: Three times a day (TID) | ORAL | Status: DC | PRN

## 2020-04-28 MED ORDER — ACETAMINOPHEN 325 MG PO TABS
325.0000 mg | ORAL_TABLET | Freq: Four times a day (QID) | ORAL | Status: DC | PRN
  Administered 2020-04-30: 650 mg via ORAL
  Filled 2020-04-28: qty 2

## 2020-04-28 MED ORDER — OXYCODONE HCL 5 MG/5ML PO SOLN: 15.0000 mg | Freq: Once | ORAL | Status: DC | PRN

## 2020-04-28 MED ORDER — DEXAMETHASONE SODIUM PHOSPHATE 10 MG/ML IJ SOLN
10.0000 mg | Freq: Once | INTRAMUSCULAR | Status: AC
  Administered 2020-04-29: 10 mg via INTRAVENOUS
  Filled 2020-04-28: qty 1

## 2020-04-28 MED ORDER — POLYETHYLENE GLYCOL 3350 17 G PO PACK: 17.0000 g | PACK | Freq: Every day | ORAL | Status: DC | PRN

## 2020-04-28 MED ORDER — SODIUM CHLORIDE 0.9 % IV SOLN: INTRAVENOUS | Status: DC

## 2020-04-28 MED ORDER — BUPIVACAINE-EPINEPHRINE 0.25% -1:200000 IJ SOLN
INTRAMUSCULAR | Status: DC | PRN
  Administered 2020-04-28: 30 mL

## 2020-04-28 MED ORDER — MIDAZOLAM HCL 5 MG/5ML IJ SOLN
INTRAMUSCULAR | Status: DC | PRN
  Administered 2020-04-28: 2 mg via INTRAVENOUS

## 2020-04-28 MED ORDER — PHENYLEPHRINE HCL-NACL 10-0.9 MG/250ML-% IV SOLN
INTRAVENOUS | Status: DC | PRN
  Administered 2020-04-28: 30 ug/min via INTRAVENOUS

## 2020-04-28 MED ORDER — LACTATED RINGERS IV SOLN: INTRAVENOUS | Status: DC

## 2020-04-28 MED ORDER — MIDAZOLAM HCL 2 MG/2ML IJ SOLN
INTRAMUSCULAR | Status: AC
  Filled 2020-04-28: qty 2

## 2020-04-28 MED ORDER — DIPHENHYDRAMINE HCL 12.5 MG/5ML PO ELIX: 12.5000 mg | ORAL_SOLUTION | ORAL | Status: DC | PRN

## 2020-04-28 MED ORDER — BUPIVACAINE-EPINEPHRINE (PF) 0.25% -1:200000 IJ SOLN
INTRAMUSCULAR | Status: AC
  Filled 2020-04-28: qty 30

## 2020-04-28 MED ORDER — ONDANSETRON HCL 4 MG PO TABS: 4.0000 mg | ORAL_TABLET | Freq: Four times a day (QID) | ORAL | Status: DC | PRN

## 2020-04-28 MED ORDER — CEFAZOLIN SODIUM-DEXTROSE 2-4 GM/100ML-% IV SOLN
2.0000 g | Freq: Four times a day (QID) | INTRAVENOUS | Status: AC
  Administered 2020-04-28 (×2): 2 g via INTRAVENOUS
  Filled 2020-04-28 (×2): qty 100

## 2020-04-28 MED ORDER — SODIUM CHLORIDE 0.9 % IR SOLN
Status: DC | PRN
  Administered 2020-04-28: 1000 mL

## 2020-04-28 MED ORDER — HYDROMORPHONE HCL 1 MG/ML IJ SOLN
0.5000 mg | INTRAMUSCULAR | Status: DC | PRN
Start: 2020-04-28 — End: 2020-04-30

## 2020-04-28 MED ORDER — AMISULPRIDE (ANTIEMETIC) 5 MG/2ML IV SOLN: 10.0000 mg | Freq: Once | INTRAVENOUS | Status: DC | PRN

## 2020-04-28 MED ORDER — METHOCARBAMOL 500 MG PO TABS
500.0000 mg | ORAL_TABLET | Freq: Four times a day (QID) | ORAL | Status: DC | PRN
  Administered 2020-04-28 – 2020-04-30 (×4): 500 mg via ORAL
  Filled 2020-04-28 (×4): qty 1

## 2020-04-28 MED ORDER — METHOCARBAMOL 500 MG IVPB - SIMPLE MED
500.0000 mg | Freq: Four times a day (QID) | INTRAVENOUS | Status: DC | PRN
  Administered 2020-04-28: 500 mg via INTRAVENOUS
  Filled 2020-04-28: qty 50

## 2020-04-28 MED ORDER — METHOCARBAMOL 500 MG IVPB - SIMPLE MED
INTRAVENOUS | Status: AC
  Filled 2020-04-28: qty 50

## 2020-04-28 MED ORDER — KETOROLAC TROMETHAMINE 30 MG/ML IJ SOLN
INTRAMUSCULAR | Status: AC
  Filled 2020-04-28: qty 1

## 2020-04-28 MED ORDER — ASPIRIN 81 MG PO CHEW
81.0000 mg | CHEWABLE_TABLET | Freq: Two times a day (BID) | ORAL | Status: DC
  Administered 2020-04-28 – 2020-04-30 (×4): 81 mg via ORAL
  Filled 2020-04-28 (×4): qty 1

## 2020-04-28 MED ORDER — HYDROMORPHONE HCL 2 MG PO TABS
4.0000 mg | ORAL_TABLET | ORAL | Status: DC | PRN
  Administered 2020-04-28 – 2020-04-30 (×8): 4 mg via ORAL
  Filled 2020-04-28 (×8): qty 2

## 2020-04-28 MED ORDER — KETOROLAC TROMETHAMINE 30 MG/ML IJ SOLN
30.0000 mg | Freq: Once | INTRAMUSCULAR | Status: AC
  Administered 2020-04-28: 30 mg via INTRAVENOUS

## 2020-04-28 MED ORDER — KETOROLAC TROMETHAMINE 30 MG/ML IJ SOLN
INTRAMUSCULAR | Status: DC | PRN
  Administered 2020-04-28: 30 mg

## 2020-04-28 MED ORDER — OXYCODONE ER 18 MG PO C12A: 18.0000 mg | EXTENDED_RELEASE_CAPSULE | Freq: Two times a day (BID) | ORAL | Status: DC

## 2020-04-28 MED ORDER — ONDANSETRON HCL 4 MG/2ML IJ SOLN: 4.0000 mg | Freq: Once | INTRAMUSCULAR | Status: DC | PRN

## 2020-04-28 MED ORDER — WATER FOR IRRIGATION, STERILE IR SOLN
Status: DC | PRN
  Administered 2020-04-28: 2000 mL

## 2020-04-28 MED ORDER — OXYCODONE HCL 5 MG PO TABS: 15.0000 mg | ORAL_TABLET | Freq: Once | ORAL | Status: DC | PRN

## 2020-04-28 MED ORDER — FENTANYL CITRATE (PF) 100 MCG/2ML IJ SOLN
INTRAMUSCULAR | Status: DC | PRN
  Administered 2020-04-28: 100 ug via INTRAVENOUS

## 2020-04-28 MED ORDER — ACETAMINOPHEN 10 MG/ML IV SOLN
1000.0000 mg | Freq: Once | INTRAVENOUS | Status: AC
  Administered 2020-04-28: 1000 mg via INTRAVENOUS
  Filled 2020-04-28: qty 100

## 2020-04-28 MED ORDER — KETOROLAC TROMETHAMINE 15 MG/ML IJ SOLN
15.0000 mg | Freq: Four times a day (QID) | INTRAMUSCULAR | Status: AC
  Administered 2020-04-28 – 2020-04-29 (×4): 15 mg via INTRAVENOUS
  Filled 2020-04-28 (×4): qty 1

## 2020-04-28 MED ORDER — ATORVASTATIN CALCIUM 20 MG PO TABS
20.0000 mg | ORAL_TABLET | Freq: Every day | ORAL | Status: DC
Start: 2020-04-29 — End: 2020-04-30
  Administered 2020-04-29 – 2020-04-30 (×2): 20 mg via ORAL
  Filled 2020-04-28 (×2): qty 1

## 2020-04-28 MED ORDER — MENTHOL 3 MG MT LOZG: 1.0000 | LOZENGE | OROMUCOSAL | Status: DC | PRN

## 2020-04-28 MED ORDER — DOCUSATE SODIUM 100 MG PO CAPS
100.0000 mg | ORAL_CAPSULE | Freq: Two times a day (BID) | ORAL | Status: DC
  Administered 2020-04-28 – 2020-04-30 (×4): 100 mg via ORAL
  Filled 2020-04-28 (×4): qty 1

## 2020-04-28 MED ORDER — PHENOL 1.4 % MT LIQD: 1.0000 | OROMUCOSAL | Status: DC | PRN

## 2020-04-28 MED ORDER — PROPOFOL 500 MG/50ML IV EMUL
INTRAVENOUS | Status: DC | PRN
  Administered 2020-04-28: 75 ug/kg/min via INTRAVENOUS

## 2020-04-28 MED ORDER — CEFAZOLIN SODIUM-DEXTROSE 2-4 GM/100ML-% IV SOLN
2.0000 g | INTRAVENOUS | Status: AC
  Administered 2020-04-28: 2 g via INTRAVENOUS
  Filled 2020-04-28: qty 100

## 2020-04-28 MED ORDER — CHLORHEXIDINE GLUCONATE 0.12 % MT SOLN
15.0000 mL | Freq: Once | OROMUCOSAL | Status: AC
  Administered 2020-04-28: 15 mL via OROMUCOSAL

## 2020-04-28 MED ORDER — HYDROMORPHONE HCL 1 MG/ML IJ SOLN
0.2500 mg | INTRAMUSCULAR | Status: DC | PRN
Start: 2020-04-28 — End: 2020-04-28

## 2020-04-28 SURGICAL SUPPLY — 66 items
ADH SKN CLS APL DERMABOND .7 (GAUZE/BANDAGES/DRESSINGS) ×2
APL PRP STRL LF DISP 70% ISPRP (MISCELLANEOUS) ×1
BAG DECANTER FOR FLEXI CONT (MISCELLANEOUS) IMPLANT
BAG SPEC THK2 15X12 ZIP CLS (MISCELLANEOUS)
BAG ZIPLOCK 12X15 (MISCELLANEOUS) IMPLANT
BALL HIP CERAMIC (Hips) ×1 IMPLANT
BLADE SURG SZ10 CARB STEEL (BLADE) IMPLANT
CHLORAPREP W/TINT 26 (MISCELLANEOUS) ×2 IMPLANT
COVER PERINEAL POST (MISCELLANEOUS) ×2 IMPLANT
COVER SURGICAL LIGHT HANDLE (MISCELLANEOUS) ×2 IMPLANT
COVER WAND RF STERILE (DRAPES) IMPLANT
CUP SECTOR GRIPTON 50MM (Cup) ×2 IMPLANT
DECANTER SPIKE VIAL GLASS SM (MISCELLANEOUS) ×2 IMPLANT
DERMABOND ADVANCED (GAUZE/BANDAGES/DRESSINGS) ×2
DERMABOND ADVANCED .7 DNX12 (GAUZE/BANDAGES/DRESSINGS) ×2 IMPLANT
DRAPE IMP U-DRAPE 54X76 (DRAPES) ×2 IMPLANT
DRAPE SHEET LG 3/4 BI-LAMINATE (DRAPES) ×6 IMPLANT
DRAPE STERI IOBAN 125X83 (DRAPES) IMPLANT
DRAPE U-SHAPE 47X51 STRL (DRAPES) ×4 IMPLANT
DRSG AQUACEL AG ADV 3.5X10 (GAUZE/BANDAGES/DRESSINGS) ×2 IMPLANT
ELECT REM PT RETURN 15FT ADLT (MISCELLANEOUS) ×2 IMPLANT
GAUZE SPONGE 4X4 12PLY STRL (GAUZE/BANDAGES/DRESSINGS) ×2 IMPLANT
GLOVE BIO SURGEON STRL SZ8.5 (GLOVE) ×4 IMPLANT
GLOVE SRG 8 PF TXTR STRL LF DI (GLOVE) ×1 IMPLANT
GLOVE SURG ENC TEXT LTX SZ7.5 (GLOVE) ×4 IMPLANT
GLOVE SURG UNDER POLY LF SZ8 (GLOVE) ×2
GLOVE SURG UNDER POLY LF SZ8.5 (GLOVE) ×2 IMPLANT
GOWN SPEC L3 XXLG W/TWL (GOWN DISPOSABLE) ×2 IMPLANT
GOWN STRL REUS W/ TWL LRG LVL3 (GOWN DISPOSABLE) ×1 IMPLANT
GOWN STRL REUS W/TWL LRG LVL3 (GOWN DISPOSABLE) ×2
HANDPIECE INTERPULSE COAX TIP (DISPOSABLE) ×2
HIP BALL CERAMIC (Hips) ×2 IMPLANT
HOLDER FOLEY CATH W/STRAP (MISCELLANEOUS) ×2 IMPLANT
HOOD PEEL AWAY FLYTE STAYCOOL (MISCELLANEOUS) ×8 IMPLANT
JET LAVAGE IRRISEPT WOUND (IRRIGATION / IRRIGATOR) ×2
KIT TURNOVER KIT A (KITS) ×2 IMPLANT
LAVAGE JET IRRISEPT WOUND (IRRIGATION / IRRIGATOR) ×1 IMPLANT
LINER ACETABULAR 32X50 (Liner) ×2 IMPLANT
MANIFOLD NEPTUNE II (INSTRUMENTS) ×2 IMPLANT
MARKER SKIN DUAL TIP RULER LAB (MISCELLANEOUS) ×2 IMPLANT
NDL SAFETY ECLIPSE 18X1.5 (NEEDLE) ×1 IMPLANT
NEEDLE HYPO 18GX1.5 SHARP (NEEDLE) ×2
NEEDLE SPNL 18GX3.5 QUINCKE PK (NEEDLE) ×2 IMPLANT
PACK ANTERIOR HIP CUSTOM (KITS) ×2 IMPLANT
PENCIL SMOKE EVACUATOR (MISCELLANEOUS) ×2 IMPLANT
SAW OSC TIP CART 19.5X105X1.3 (SAW) ×2 IMPLANT
SEALER BIPOLAR AQUA 6.0 (INSTRUMENTS) ×2 IMPLANT
SET HNDPC FAN SPRY TIP SCT (DISPOSABLE) ×1 IMPLANT
STEM TRI LOC BPS SZ7 W GRIPTON (Hips) ×1 IMPLANT
SUT ETHIBOND NAB CT1 #1 30IN (SUTURE) ×4 IMPLANT
SUT MNCRL AB 3-0 PS2 18 (SUTURE) ×2 IMPLANT
SUT MNCRL AB 4-0 PS2 18 (SUTURE) ×2 IMPLANT
SUT MON AB 2-0 CT1 36 (SUTURE) ×6 IMPLANT
SUT STRATAFIX PDO 1 14 VIOLET (SUTURE) ×2
SUT STRATFX PDO 1 14 VIOLET (SUTURE) ×1
SUT VIC AB 1 CTX 36 (SUTURE) ×2
SUT VIC AB 1 CTX36XBRD ANBCTR (SUTURE) ×1 IMPLANT
SUT VIC AB 2-0 CT1 27 (SUTURE) ×2
SUT VIC AB 2-0 CT1 TAPERPNT 27 (SUTURE) ×1 IMPLANT
SUT VLOC 3-0 9IN GRN (SUTURE) ×2 IMPLANT
SUTURE STRATFX PDO 1 14 VIOLET (SUTURE) ×1 IMPLANT
SYR 3ML LL SCALE MARK (SYRINGE) ×2 IMPLANT
TRAY FOLEY MTR SLVR 16FR STAT (SET/KITS/TRAYS/PACK) IMPLANT
TRI LOC BPS SZ 7 W GRIPTON (Hips) ×2 IMPLANT
TUBE SUCTION HIGH CAP CLEAR NV (SUCTIONS) ×2 IMPLANT
WATER STERILE IRR 1000ML POUR (IV SOLUTION) ×2 IMPLANT

## 2020-04-28 NOTE — Interval H&P Note (Signed)
History and Physical Interval Note:  04/28/2020 10:31 AM  Misty [name]  has presented today for surgery, with the diagnosis of Left hip avascular necrosis.  The various methods of treatment have been discussed with the patient and family. After consideration of risks, benefits and other options for treatment, the patient has consented to  Procedure(s) with comments: [place] (Left) - 3E bed as a surgical intervention.  The patient's history has been reviewed, patient examined, no change in status, stable for surgery.  I have reviewed the patient's chart and labs.  Questions were answered to the patient's satisfaction.     Hilton Cork Loni Abdon

## 2020-04-28 NOTE — Discharge Instructions (Signed)
°Dr. Ameah Chanda °Joint Replacement Specialist °[facility] °[address] °[place], [place] [address] °[phone] ° ° °TOTAL HIP REPLACEMENT POSTOPERATIVE DIRECTIONS ° ° ° °Hip Rehabilitation, Guidelines Following Surgery  ° °WEIGHT BEARING °Weight bearing as tolerated with assist device (walker, cane, etc) as directed, use it as long as suggested by your surgeon or therapist, typically at least 4-6 weeks. ° °The results of a hip operation are greatly improved after range of motion and muscle strengthening exercises. Follow all safety measures which are given to protect your hip. If any of these exercises cause increased pain or swelling in your joint, decrease the amount until you are comfortable again. Then slowly increase the exercises. Call your caregiver if you have problems or questions.  ° °HOME CARE INSTRUCTIONS  °Most of the following instructions are designed to prevent the dislocation of your new hip.  °Remove items at home which could result in a fall. This includes throw rugs or furniture in walking pathways.  °Continue medications as instructed at time of discharge. °· You may have some home medications which will be placed on hold until you complete the course of blood thinner medication. °· You may start showering once you are discharged home. Do not remove your dressing. °Do not put on socks or shoes without following the instructions of your caregivers.   °Sit on chairs with arms. Use the chair arms to help push yourself up when arising.  °Arrange for the use of a toilet seat elevator so you are not sitting low.  °· Walk with walker as instructed.  °You may resume a sexual relationship in one month or when given the OK by your caregiver.  °Use walker as long as suggested by your caregivers.  °You may put full weight on your legs and walk as much as is comfortable. °Avoid periods of inactivity such as sitting longer than an hour when not asleep. This helps prevent  blood clots.  °You may return to work once you are cleared by your surgeon.  °Do not drive a car for 6 weeks or until released by your surgeon.  °Do not drive while taking narcotics.  °Wear elastic stockings for two weeks following surgery during the day but you may remove then at night.  °Make sure you keep all of your appointments after your operation with all of your doctors and caregivers. You should call the office at the above phone number and make an appointment for approximately two weeks after the date of your surgery. °Please pick up a stool softener and laxative for home use as long as you are requiring pain medications. °· ICE to the affected hip every three hours for 30 minutes at a time and then as needed for pain and swelling. Continue to use ice on the hip for pain and swelling from surgery. You may notice swelling that will progress down to the foot and ankle.  This is normal after surgery.  Elevate the leg when you are not up walking on it.   °It is important for you to complete the blood thinner medication as prescribed by your doctor. °· Continue to use the breathing machine which will help keep your temperature down.  It is common for your temperature to cycle up and down following surgery, especially at night when you are not up moving around and exerting yourself.  The breathing machine keeps your lungs expanded and your temperature down. ° °RANGE OF MOTION AND STRENGTHENING EXERCISES  °These exercises are   designed to help you keep full movement of your hip joint. Follow your caregiver's or physical therapist's instructions. Perform all exercises about fifteen times, three times per day or as directed. Exercise both hips, even if you have had only one joint replacement. These exercises can be done on a training (exercise) mat, on the floor, on a table or on a bed. Use whatever works the best and is most comfortable for you. Use music or television while you are exercising so that the exercises  are a pleasant break in your day. This will make your life better with the exercises acting as a break in routine you can look forward to.  °Lying on your back, slowly slide your foot toward your buttocks, raising your knee up off the floor. Then slowly slide your foot back down until your leg is straight again.  °Lying on your back spread your legs as far apart as you can without causing discomfort.  °Lying on your side, raise your upper leg and foot straight up from the floor as far as is comfortable. Slowly lower the leg and repeat.  °Lying on your back, tighten up the muscle in the front of your thigh (quadriceps muscles). You can do this by keeping your leg straight and trying to raise your heel off the floor. This helps strengthen the largest muscle supporting your knee.  °Lying on your back, tighten up the muscles of your buttocks both with the legs straight and with the knee bent at a comfortable angle while keeping your heel on the floor.  ° °SKILLED REHAB INSTRUCTIONS: °If the patient is transferred to a skilled rehab facility following release from the hospital, a list of the current medications will be sent to the facility for the patient to continue.  When discharged from the skilled rehab facility, please have the facility set up the patient's Home Health Physical Therapy prior to being released. Also, the skilled facility will be responsible for providing the patient with their medications at time of release from the facility to include their pain medication and their blood thinner medication. If the patient is still at the rehab facility at time of the two week follow up appointment, the skilled rehab facility will also need to assist the patient in arranging follow up appointment in our office and any transportation needs. ° °MAKE SURE YOU:  °Understand these instructions.  °Will watch your condition.  °Will get help right away if you are not doing well or get worse. ° °Pick up stool softner and  laxative for home use following surgery while on pain medications. °Do not remove your dressing. °The dressing is waterproof--it is OK to take showers. °Continue to use ice for pain and swelling after surgery. °Do not use any lotions or creams on the incision until instructed by your surgeon. °Total Hip Protocol. ° ° °

## 2020-04-28 NOTE — Op Note (Signed)
OPERATIVE REPORT  SURGEON: Rod Can, MD   ASSISTANT: Nehemiah Massed, PA-C.  PREOPERATIVE DIAGNOSIS: Left hip avascular necrosis.   POSTOPERATIVE DIAGNOSIS: Left hip  avascular necrosis.   PROCEDURE: Left total hip arthroplasty, anterior approach.   IMPLANTS: DePuy Tri Lock stem, size 7, hi offset. DePuy Pinnacle Cup, size 50 mm. DePuy Altrx liner, size 32 by 50 mm, neutral. DePuy Biolox ceramic head ball, size 32 + 1 mm.  ANESTHESIA:  MAC and Spinal  ESTIMATED BLOOD LOSS:-150 mL    ANTIBIOTICS: 2g Ancef.  DRAINS: None.  COMPLICATIONS: None.   CONDITION: PACU - hemodynamically stable.   BRIEF CLINICAL NOTE: Misty [name] is a 60 y.o. female with a long-standing history of Left hip  avascular necrosis. After failing conservative management, the patient was indicated for total hip arthroplasty. The risks, benefits, and alternatives to the procedure were explained, and the patient elected to proceed.  PROCEDURE IN DETAIL: Surgical site was marked by myself in the pre-op holding area. Once inside the operating room, spinal anesthesia was obtained, and a foley catheter was inserted. The patient was then positioned on the Hana table.  All bony prominences were well padded.  The hip was prepped and draped in the normal sterile surgical fashion.  A time-out was called verifying side and site of surgery. The patient received IV antibiotics within 60 minutes of beginning the procedure.   The direct anterior approach to the hip was performed through the Hueter interval.  Lateral femoral circumflex vessels were treated with the Auqumantys. The anterior capsule was exposed and an inverted T capsulotomy was made. The femoral neck cut was made to the level of the templated cut.  A corkscrew was placed into the head and the head was removed.  The femoral head was found to be nearly completely collapsed. The head was passed to the back table and was measured.   Acetabular exposure was  achieved, and the pulvinar and labrum were excised. Sequential reaming of the acetabulum was then performed up to a size 49 mm reamer. A 50 mm cup was then opened and impacted into place at approximately 40 degrees of abduction and 20 degrees of anteversion. The final polyethylene liner was impacted into place and acetabular osteophytes were removed.    I then gained femoral exposure taking care to protect the abductors and greater trochanter.  This was performed using standard external rotation, extension, and adduction.  The capsule was peeled off the inner aspect of the greater trochanter, taking care to preserve the short external rotators. A cookie cutter was used to enter the femoral canal, and then the femoral canal finder was placed.  Sequential broaching was performed up to a size 7.  Calcar planer was used on the femoral neck remnant.  I placed a hi offset neck and a trial head ball.  The hip was reduced.  Leg lengths and offset were checked fluoroscopically.  The hip was dislocated and trial components were removed.  The final implants were placed, and the hip was reduced.  Fluoroscopy was used to confirm component position and leg lengths.  At 90 degrees of external rotation and full extension, the hip was stable to an anterior directed force.   The wound was copiously irrigated with Irrisept solution and normal saline using pule lavage.  Marcaine solution was injected into the periarticular soft tissue.  The wound was closed in layers using #1 Stratafix for the fascia, 2-0 Vicryl for the subcutaneous fat, 2-0 Monocryl for the deep dermal  layer, 3-0 running Monocryl subcuticular stitch, and Dermabond for the skin.  Once the glue was fully dried, an Aquacell Ag dressing was applied.  The patient was transported to the recovery room in stable condition.  Sponge, needle, and instrument counts were correct at the end of the case x2.  The patient tolerated the procedure well and there were no known  complications.  Please note that a surgical assistant was a medical necessity for this procedure to perform it in a safe and expeditious manner. Assistant was necessary to provide appropriate retraction of vital neurovascular structures, to prevent femoral fracture, and to allow for anatomic placement of the prosthesis.

## 2020-04-28 NOTE — Telephone Encounter (Signed)
LVM for pt to rtn my call to schedule awv with nha. Pease schedule this appt if pt calls the office.

## 2020-04-28 NOTE — Care Plan (Addendum)
Ortho Bundle Case Management Note  Patient Details  Name: Misty [name] MRN: [id number] Date of Birth: [birth year]                  L THA on 04/28/20. DCP: Home with daughter. 2 story home with 3 steps. DME: RW ordered through [facility]. Has 3in1. PT: HEP   DME Arranged:  Walker rolling DME Agency:  [employer]  HH Arranged:    [place] Agency:     Additional Comments: Please contact me with any questions of if this plan should need to change.  Marianne Sofia, RN,CCM [facility]  [phone] 04/28/2020, 12:42 PM

## 2020-04-28 NOTE — Anesthesia Postprocedure Evaluation (Signed)
Anesthesia Post Note  Patient: Misty [name]  Procedure(s) Performed: TOTAL HIP ARTHROPLASTY ANTERIOR APPROACH (Left Hip)     Patient location during evaluation: PACU Anesthesia Type: Spinal Level of consciousness: oriented and awake and alert Pain management: pain level controlled Vital Signs Assessment: post-procedure vital signs reviewed and stable Respiratory status: spontaneous breathing, respiratory function stable and nonlabored ventilation Cardiovascular status: blood pressure returned to baseline and stable Postop Assessment: no headache, no backache, no apparent nausea or vomiting and spinal receding Anesthetic complications: no   No complications documented.  Last Vitals:  Vitals:   04/28/20 1415 04/28/20 1430  BP:  111/86  Pulse: 66 92  Resp: 10 11  Temp:    SpO2: 100% 100%    Last Pain:  Vitals:   04/28/20 1430  TempSrc:   PainSc: [name]

## 2020-04-28 NOTE — Transfer of Care (Signed)
Immediate Anesthesia Transfer of Care Note  Patient: Misty [name]  Procedure(s) Performed: TOTAL HIP ARTHROPLASTY ANTERIOR APPROACH (Left Hip)  Patient Location: PACU  Anesthesia Type:Spinal  Level of Consciousness: awake, alert  and oriented  Airway & Oxygen Therapy: Patient Spontanous Breathing and Patient connected to face mask oxygen  Post-op Assessment: Report given to RN and Post -op Vital signs reviewed and stable  Post vital signs: Reviewed and stable  Last Vitals:  Vitals Value Taken Time  BP    Temp    Pulse    Resp    SpO2      Last Pain:  Vitals:   04/28/20 0943  TempSrc:   PainSc: 4       Patients Stated Pain Goal: 2 (18/36/72 5500)  Complications: No complications documented.

## 2020-04-28 NOTE — Evaluation (Signed)
Physical Therapy Evaluation Patient Details Name: Misty [name] MRN: [id number] DOB: 1960-09-02 Today's Date: 04/28/2020   History of Present Illness  Patient is 60 y.o. female s/p Lt THA anterior approach on 04/28/20 with PMH significant for HTN, GERD, MS, Colon Cancer, OA, depression, ACDF (1 level), gastric bypass.    Clinical Impression  Misty [name] is a 60 y.o. female POD 0 s/p Lt THA. Patient reports independence with mobility at baseline. Patient is now limited by functional impairments (see PT problem list below) and requires min guard/assist for transfers and gait with RW. Patient was able to ambulate ~50 feet with RW and min guard/assist. Patient instructed in exercise to facilitate ROM and circulation. Patient will benefit from continued skilled PT interventions to address impairments and progress towards PLOF. Acute PT will follow to progress mobility and stair training in preparation for safe discharge home.     Follow Up Recommendations Follow surgeon's recommendation for DC plan and follow-up therapies    Equipment Recommendations  Rolling walker with 5" wheels    Recommendations for Other Services       Precautions / Restrictions Precautions Precautions: Fall Restrictions Weight Bearing Restrictions: No Other Position/Activity Restrictions: WBAT      Mobility  Bed Mobility Overal bed mobility: Needs Assistance Bed Mobility: Supine to Sit     Supine to sit: Min assist;HOB elevated     General bed mobility comments: cues for sequencing and use of bed rail    Transfers Overall transfer level: Needs assistance Equipment used: Rolling walker (2 wheeled) Transfers: Sit to/from Stand Sit to Stand: Min assist         General transfer comment: cues for hand placement/technique with RW, assist needed to complete power up.  Ambulation/Gait Ambulation/Gait assistance: Min assist;Min guard Gait Distance (Feet): 50 Feet Assistive device: Rolling walker  (2 wheeled) Gait Pattern/deviations: Step-to pattern;Decreased weight shift to left Gait velocity: decr   General Gait Details: cues for step pattern and safe proximity, cues needed intermittently and assist for walker position. no overt LOB noted.  Stairs            Wheelchair Mobility    Modified Rankin (Stroke Patients Only)       Balance Overall balance assessment: Needs assistance Sitting-balance support: Feet supported Sitting balance-Leahy Scale: Good     Standing balance support: During functional activity;Bilateral upper extremity supported Standing balance-Leahy Scale: Fair                               Pertinent Vitals/Pain Pain Assessment: Faces Faces Pain Scale: Hurts little more Pain Location: back hurts more than hip Pain Descriptors / Indicators: Aching;Discomfort Pain Intervention(s): Limited activity within patient's tolerance;Monitored during session;Repositioned;Ice applied    Home Living Family/patient expects to be discharged to:: Private residence Living Arrangements: Alone Available Help at Discharge: Family Type of Home: House Home Access: Stairs to enter Entrance Stairs-Rails: Right Entrance Stairs-Number of Steps: 3 Home Layout: Two level;Able to live on main level with bedroom/bathroom Home Equipment: Walker - 4 wheels;Shower seat Additional Comments: pt will stay with her daughter for 1 week.    Prior Function Level of Independence: Independent               Hand Dominance   Dominant Hand: Right    Extremity/Trunk Assessment   Upper Extremity Assessment Upper Extremity Assessment: Overall WFL for tasks assessed    Lower Extremity Assessment Lower Extremity Assessment: Overall  WFL for tasks assessed    Cervical / Trunk Assessment Cervical / Trunk Assessment: Normal  Communication   Communication: No difficulties  Cognition Arousal/Alertness: Awake/alert Behavior During Therapy: WFL for tasks  assessed/performed Overall Cognitive Status: Within Functional Limits for tasks assessed                                        General Comments      Exercises Total Joint Exercises Ankle Circles/Pumps: AROM;Both;20 reps;Seated Quad Sets: AROM;Both;10 reps;Seated   Assessment/Plan    PT Assessment Patient needs continued PT services  PT Problem List Decreased strength;Decreased range of motion;Decreased activity tolerance;Decreased balance;Decreased mobility;Decreased knowledge of use of DME;Decreased knowledge of precautions       PT Treatment Interventions DME instruction;Gait training;Stair training;Functional mobility training;Therapeutic activities;Therapeutic exercise;Balance training;Patient/family education    PT Goals (Current goals can be found in the Care Plan section)  Acute Rehab PT Goals Patient Stated Goal: get back to independence PT Goal Formulation: With patient Time For Goal Achievement: 05/05/20 Potential to Achieve Goals: Good    Frequency 7X/week   Barriers to discharge        Co-evaluation               AM-PAC PT "6 Clicks" Mobility  Outcome Measure Help needed turning from your back to your side while in a flat bed without using bedrails?: None Help needed moving from lying on your back to sitting on the side of a flat bed without using bedrails?: A Little Help needed moving to and from a bed to a chair (including a wheelchair)?: A Little Help needed standing up from a chair using your arms (e.g., wheelchair or bedside chair)?: A Little Help needed to walk in hospital room?: A Little Help needed climbing 3-5 steps with a railing? : A Little 6 Click Score: 19    End of Session Equipment Utilized During Treatment: Gait belt Activity Tolerance: Patient tolerated treatment well Patient left: in chair;with call bell/phone within reach;with family/visitor present Nurse Communication: Mobility status PT Visit Diagnosis: Muscle  weakness (generalized) (M62.81);Difficulty in walking, not elsewhere classified (R26.2)    Time: 8453-6468 PT Time Calculation (min) (ACUTE ONLY): 32 min   Charges:   PT Evaluation $PT Eval Low Complexity: 1 Low PT Treatments $Gait Training: 8-22 mins        Verner Mould, DPT Acute Rehabilitation Services Office [phone] Pager [phone]    Jacques Navy 04/28/2020, 7:08 PM

## 2020-04-28 NOTE — Anesthesia Preprocedure Evaluation (Addendum)
Anesthesia Evaluation  Patient identified by MRN, date of birth, ID band Patient awake    Reviewed: Allergy & Precautions, NPO status , Patient's Chart, lab work & pertinent test results  History of Anesthesia Complications Negative for: history of anesthetic complications  Airway Mallampati: II  TM Distance: >3 FB Neck ROM: Full    Dental   Pulmonary neg pulmonary ROS, Patient abstained from smoking., former smoker,    Pulmonary exam normal        Cardiovascular hypertension, Pt. on medications Normal cardiovascular exam     Neuro/Psych Depression    GI/Hepatic GERD  ,(+)     substance abuse (oxycodone ER 18 mg BID)  ,   Endo/Other  diabetes (pre-diabetes, no meds)Morbid obesity  Renal/GU negative Renal ROS  negative genitourinary   Musculoskeletal  (+) Arthritis , Osteoarthritis,  narcotic dependent  Abdominal   Peds  Hematology  (+) anemia ,   Anesthesia Other Findings   Reproductive/Obstetrics                           Anesthesia Physical Anesthesia Plan  ASA: III  Anesthesia Plan: Spinal   Post-op Pain Management:    Induction:   PONV Risk Score and Plan: 2 and Propofol infusion, Treatment may vary due to age or medical condition, Ondansetron and TIVA  Airway Management Planned: Nasal Cannula and Simple Face Mask  Additional Equipment: None  Intra-op Plan:   Post-operative Plan:   Informed Consent: I have reviewed the patients History and Physical, chart, labs and discussed the procedure including the risks, benefits and alternatives for the proposed anesthesia with the patient or authorized representative who has indicated his/her understanding and acceptance.       Plan Discussed with:   Anesthesia Plan Comments:         Anesthesia Quick Evaluation

## 2020-04-29 ENCOUNTER — Encounter (HOSPITAL_COMMUNITY): Payer: Self-pay | Admitting: Orthopedic Surgery

## 2020-04-29 DIAGNOSIS — I10 Essential (primary) hypertension: Secondary | ICD-10-CM | POA: Diagnosis not present

## 2020-04-29 DIAGNOSIS — M1612 Unilateral primary osteoarthritis, left hip: Secondary | ICD-10-CM | POA: Diagnosis not present

## 2020-04-29 DIAGNOSIS — Z87891 Personal history of nicotine dependence: Secondary | ICD-10-CM | POA: Diagnosis not present

## 2020-04-29 DIAGNOSIS — Z791 Long term (current) use of non-steroidal anti-inflammatories (NSAID): Secondary | ICD-10-CM | POA: Diagnosis not present

## 2020-04-29 DIAGNOSIS — E119 Type 2 diabetes mellitus without complications: Secondary | ICD-10-CM | POA: Diagnosis not present

## 2020-04-29 DIAGNOSIS — Z79899 Other long term (current) drug therapy: Secondary | ICD-10-CM | POA: Diagnosis not present

## 2020-04-29 DIAGNOSIS — Z7984 Long term (current) use of oral hypoglycemic drugs: Secondary | ICD-10-CM | POA: Diagnosis not present

## 2020-04-29 DIAGNOSIS — M8568 Other cyst of bone, other site: Secondary | ICD-10-CM | POA: Diagnosis not present

## 2020-04-29 DIAGNOSIS — Z9489 Other transplanted organ and tissue status: Secondary | ICD-10-CM | POA: Diagnosis not present

## 2020-04-29 DIAGNOSIS — D849 Immunodeficiency, unspecified: Secondary | ICD-10-CM | POA: Diagnosis not present

## 2020-04-29 LAB — BASIC METABOLIC PANEL
Anion gap: 7 (ref 5–15)
BUN: 24 mg/dL — ABNORMAL HIGH (ref 6–20)
CO2: 22 mmol/L (ref 22–32)
Calcium: 8.4 mg/dL — ABNORMAL LOW (ref 8.9–10.3)
Chloride: 107 mmol/L (ref 98–111)
Creatinine, Ser: 0.95 mg/dL (ref 0.44–1.00)
GFR, Estimated: >60 mL/min (ref >60)
Glucose, Bld: 115 mg/dL — ABNORMAL HIGH (ref 70–99)
Potassium: 4.5 mmol/L (ref 3.5–5.1)
Sodium: 136 mmol/L (ref 135–145)

## 2020-04-29 LAB — CBC
HCT: 28.2 % — ABNORMAL LOW (ref 36.0–46.0)
Hemoglobin: 8.3 g/dL — ABNORMAL LOW (ref 12.0–15.0)
MCH: 30.2 pg (ref 26.0–34.0)
MCHC: 29.4 g/dL — ABNORMAL LOW (ref 30.0–36.0)
MCV: 102.5 fL — ABNORMAL HIGH (ref 80.0–100.0)
Platelets: 164 10*3/uL (ref 150–400)
RBC: 2.75 MIL/uL — ABNORMAL LOW (ref 3.87–5.11)
RDW: 14.2 % (ref 11.5–15.5)
WBC: 5.1 10*3/uL (ref 4.0–10.5)
nRBC: 0 % (ref 0.0–0.2)

## 2020-04-29 MED ORDER — SENNA 8.6 MG PO TABS: 2.0000 | ORAL_TABLET | Freq: Every day | ORAL | 1 refills | Status: DC

## 2020-04-29 MED ORDER — ASPIRIN 81 MG PO CHEW: 81.0000 mg | CHEWABLE_TABLET | Freq: Two times a day (BID) | ORAL | 0 refills | Status: AC

## 2020-04-29 MED ORDER — HYDROMORPHONE HCL 4 MG PO TABS: 2.0000 mg | ORAL_TABLET | ORAL | 0 refills | Status: DC | PRN

## 2020-04-29 NOTE — Plan of Care (Signed)
Plan of care reviewed and discussed with the patient. 

## 2020-04-29 NOTE — Plan of Care (Signed)
  Problem: Pain Management: Goal: Pain level will decrease with appropriate interventions Outcome: Progressing   Problem: Elimination: Goal: Will not experience complications related to urinary retention Outcome: Progressing   

## 2020-04-29 NOTE — Progress Notes (Signed)
Physical Therapy Treatment Patient Details Name: Misty [name] MRN: [id number] DOB: [birth year] Today's Date: 04/29/2020    History of Present Illness Patient is 60 y.o. female s/p Lt THA anterior approach on 04/28/20 with PMH significant for HTN, GERD, MS, Colon Cancer, OA, depression, ACDF (1 level), gastric bypass.    PT Comments    Noted improvement in stability and ambulation toleration.  Pt hopeful for dc home tomorrow am.   Follow Up Recommendations  Follow surgeon's recommendation for DC plan and follow-up therapies     Equipment Recommendations  Rolling walker with 5" wheels    Recommendations for Other Services       Precautions / Restrictions Precautions Precautions: Fall Restrictions Weight Bearing Restrictions: No Other Position/Activity Restrictions: WBAT    Mobility  Bed Mobility Overal bed mobility: Needs Assistance Bed Mobility: Sit to Supine       Sit to supine: Min assist   General bed mobility comments: Increased time with cues for sequence and use of R LE to self assist    Transfers Overall transfer level: Needs assistance Equipment used: Rolling walker (2 wheeled) Transfers: Sit to/from Stand Sit to Stand: Min assist;Min guard         General transfer comment: cues for hand placement/technique with RW, assist needed to complete power up.  Ambulation/Gait Ambulation/Gait assistance: Min guard Gait Distance (Feet): 128 Feet Assistive device: Rolling walker (2 wheeled) Gait Pattern/deviations: Step-to pattern;Decreased weight shift to left;Decreased step length - right;Decreased step length - left;Shuffle;Trunk flexed Gait velocity: decr   General Gait Details: Increased time with cues for sequence, posture and position from [employer]             Wheelchair Mobility    Modified Rankin (Stroke Patients Only)       Balance Overall balance assessment: Needs assistance Sitting-balance support: Feet supported Sitting  balance-Leahy Scale: Good     Standing balance support: During functional activity;Bilateral upper extremity supported Standing balance-Leahy Scale: Fair                              Cognition Arousal/Alertness: Awake/alert Behavior During Therapy: WFL for tasks assessed/performed Overall Cognitive Status: Within Functional Limits for tasks assessed                                        Exercises      General Comments        Pertinent Vitals/Pain Pain Assessment: 0-10 Pain Score: 5  Pain Location: L hip/thigh Pain Descriptors / Indicators: Aching;Sore Pain Intervention(s): Limited activity within patient's tolerance    Home Living                      Prior Function            PT Goals (current goals can now be found in the care plan section) Acute Rehab PT Goals Patient Stated Goal: get back to independence PT Goal Formulation: With patient Time For Goal Achievement: 05/05/20 Potential to Achieve Goals: Good Progress towards PT goals: Progressing toward goals    Frequency    7X/week      PT Plan Current plan remains appropriate    Co-evaluation              AM-PAC PT "6 Clicks" Mobility   Outcome Measure  Help  needed turning from your back to your side while in a flat bed without using bedrails?: None Help needed moving from lying on your back to sitting on the side of a flat bed without using bedrails?: A Little Help needed moving to and from a bed to a chair (including a wheelchair)?: A Little Help needed standing up from a chair using your arms (e.g., wheelchair or bedside chair)?: A Little Help needed to walk in hospital room?: A Little Help needed climbing 3-5 steps with a railing? : A Lot 6 Click Score: 18    End of Session Equipment Utilized During Treatment: Gait belt Activity Tolerance: Patient tolerated treatment well Patient left: in bed;with call bell/phone within reach;with bed alarm  set Nurse Communication: Mobility status PT Visit Diagnosis: Muscle weakness (generalized) (M62.81);Difficulty in walking, not elsewhere classified (R26.2)     Time: 8657-8469 PT Time Calculation (min) (ACUTE ONLY): 24 min  Charges:  $Gait Training: 23-37 mins                     [place] Pager [phone] Office [phone]    Blayke Pinera 04/29/2020, 4:30 PM

## 2020-04-29 NOTE — Progress Notes (Signed)
    Subjective:  Patient reports pain as moderate to severe.  Denies N/V/CP/SOB. Slow progress with PT  Objective:   VITALS:   Vitals:   04/28/20 2225 04/29/20 0203 04/29/20 0646 04/29/20 1307  BP: (!) 115/59 124/85 126/69 134/80  Pulse: 96 (!) 101 84 84  Resp: 17 18 18 16   Temp: 97.9 F (36.6 C) 99 F (37.2 C) 98.7 F (37.1 C) 98.4 F (36.9 C)  TempSrc: Oral Oral Oral Oral  SpO2: 100% 97% 99% 96%  Weight:      Height:        NAD Neurovascular intact Sensation intact distally Intact pulses distally Dorsiflexion/Plantar flexion intact Incision: dressing C/D/I Compartment soft   Lab Results  Component Value Date   WBC 5.1 04/29/2020   HGB 8.3 (L) 04/29/2020   HCT 28.2 (L) 04/29/2020   MCV 102.5 (H) 04/29/2020   PLT 164 04/29/2020   BMET    Component Value Date/Time   NA 136 04/29/2020 0321   K 4.5 04/29/2020 0321   CL 107 04/29/2020 0321   CO2 22 04/29/2020 0321   GLUCOSE 115 (H) 04/29/2020 0321   BUN 24 (H) 04/29/2020 0321   CREATININE 0.95 04/29/2020 0321   CREATININE 0.51 08/21/2013 1337   CALCIUM 8.4 (L) 04/29/2020 0321   GFRNONAA >60 04/29/2020 0321   GFRNONAA >89 08/03/2013 1127   GFRAA >60 02/26/2018 0525   GFRAA >89 08/03/2013 1127     Assessment/Plan: 1 Day Post-Op   Principal Problem:   Osteoarthritis of left hip Active Problems:   Avascular necrosis of hip, left (HCC)   WBAT with walker DVT ppx: Aspirin, SCDs, TEDS PO pain control: dilaudid PRN PT/OT Dispo: D/C home with HEP once clears PT, likely tomorrow   Hilton Cork Daylyn Azbill 04/29/2020, 1:50 PM   Rod Can, MD [phone] [place] is now [facility]  [facility] [place] [address], [place], [place] [address] Phone: [phone] [email or web address] Facebook  [employer]

## 2020-04-29 NOTE — Progress Notes (Signed)
Physical Therapy Treatment Patient Details Name: Misty [name] MRN: [id number] DOB: May 04, 1960 Today's Date: 04/29/2020    History of Present Illness Patient is 60 y.o. female s/p Lt THA anterior approach on 04/28/20 with PMH significant for HTN, GERD, MS, Colon Cancer, OA, depression, ACDF (1 level), gastric bypass.    PT Comments    Pt cooperative and progressing slowly but steadily with mobility.  HEP introduced and pt up to ambulate increased distance in hall but with c/o increased pain.  Pt expressing apprehension regarding return home.  Follow Up Recommendations  Follow surgeon's recommendation for DC plan and follow-up therapies     Equipment Recommendations  Rolling walker with 5" wheels    Recommendations for Other Services       Precautions / Restrictions Precautions Precautions: Fall Restrictions Weight Bearing Restrictions: No Other Position/Activity Restrictions: WBAT    Mobility  Bed Mobility Overal bed mobility: Needs Assistance Bed Mobility: Supine to Sit     Supine to sit: Min assist;HOB elevated     General bed mobility comments: Increased time with cues for sequence and use of R LE to self assist    Transfers Overall transfer level: Needs assistance Equipment used: Rolling walker (2 wheeled) Transfers: Sit to/from Stand Sit to Stand: Min assist         General transfer comment: cues for hand placement/technique with RW, assist needed to complete power up.  Ambulation/Gait Ambulation/Gait assistance: Min assist;Min guard Gait Distance (Feet): 65 Feet Assistive device: Rolling walker (2 wheeled) Gait Pattern/deviations: Step-to pattern;Decreased weight shift to left;Decreased step length - right;Decreased step length - left;Shuffle;Trunk flexed Gait velocity: decr   General Gait Details: Increased time with cues for sequence, posture and position from [employer]             Wheelchair Mobility    Modified Rankin (Stroke  Patients Only)       Balance Overall balance assessment: Needs assistance Sitting-balance support: Feet supported Sitting balance-Leahy Scale: Good     Standing balance support: During functional activity;Bilateral upper extremity supported Standing balance-Leahy Scale: Fair                              Cognition Arousal/Alertness: Awake/alert Behavior During Therapy: WFL for tasks assessed/performed Overall Cognitive Status: Within Functional Limits for tasks assessed                                        Exercises Total Joint Exercises Ankle Circles/Pumps: AROM;Both;20 reps;Seated Quad Sets: AROM;Both;10 reps;Seated Heel Slides: AAROM;Left;20 reps;Supine Hip ABduction/ADduction: AAROM;Left;15 reps;Supine    General Comments        Pertinent Vitals/Pain Pain Assessment: 0-10 Pain Score: 7  Pain Location: L hip/thigh Pain Descriptors / Indicators: Aching;Sore Pain Intervention(s): Limited activity within patient's tolerance;Monitored during session;Premedicated before session;Ice applied    Home Living                      Prior Function            PT Goals (current goals can now be found in the care plan section) Acute Rehab PT Goals Patient Stated Goal: get back to independence PT Goal Formulation: With patient Time For Goal Achievement: 05/05/20 Potential to Achieve Goals: Good Progress towards PT goals: Progressing toward goals    Frequency  7X/week      PT Plan Current plan remains appropriate    Co-evaluation              AM-PAC PT "6 Clicks" Mobility   Outcome Measure  Help needed turning from your back to your side while in a flat bed without using bedrails?: None Help needed moving from lying on your back to sitting on the side of a flat bed without using bedrails?: A Little Help needed moving to and from a bed to a chair (including a wheelchair)?: A Little Help needed standing up from a  chair using your arms (e.g., wheelchair or bedside chair)?: A Little Help needed to walk in hospital room?: A Little Help needed climbing 3-5 steps with a railing? : A Lot 6 Click Score: 18    End of Session Equipment Utilized During Treatment: Gait belt Activity Tolerance: Patient tolerated treatment well Patient left: in chair;with call bell/phone within reach;with family/visitor present Nurse Communication: Mobility status PT Visit Diagnosis: Muscle weakness (generalized) (M62.81);Difficulty in walking, not elsewhere classified (R26.2)     Time: 9169-4503 PT Time Calculation (min) (ACUTE ONLY): 46 min  Charges:  $Gait Training: 8-22 mins $Therapeutic Exercise: 8-22 mins $Therapeutic Activity: 8-22 mins                     [place] Pager [phone] Office [phone]    Jerrell Mangel 04/29/2020, 12:16 PM

## 2020-04-29 NOTE — TOC Progression Note (Addendum)
Transition of Care [facility]) - Progression Note    Patient Details  Name: ORAH SONNEN MRN: [id number] Date of Birth: 1961/02/27  [facility]) CM/SW [place], LCSW Phone Number: 04/29/2020, 2:09 PM  Clinical Narrative:   Re: DME-RW  [place] notified the patient and hospital staff the patient her insurance will not cover RW because she received a walker less than five ago. CSW explain the need for RW to be ordered before discharge.CSW offered to order through [place] private pay. Patient declines. She reports her family will purchase a RW and bring it to the hospital at discharge.  Nursing staff please confirm the patient has a RW before she discharges.    3:32PM Patient notified nursing staff her family cannot provide the walker.  CSW ordered through a rolling walker  [employer]. RW will be delivered to the patient bedside before discharge.       Expected Discharge Plan and Services                           DME Arranged: Walker rolling DME Agency: [place]                   Social Determinants of Health (SDOH) Interventions    Readmission Risk Interventions No flowsheet data found.

## 2020-04-30 DIAGNOSIS — E119 Type 2 diabetes mellitus without complications: Secondary | ICD-10-CM | POA: Diagnosis not present

## 2020-04-30 DIAGNOSIS — D849 Immunodeficiency, unspecified: Secondary | ICD-10-CM | POA: Diagnosis not present

## 2020-04-30 DIAGNOSIS — Z87891 Personal history of nicotine dependence: Secondary | ICD-10-CM | POA: Diagnosis not present

## 2020-04-30 DIAGNOSIS — M1612 Unilateral primary osteoarthritis, left hip: Secondary | ICD-10-CM | POA: Diagnosis not present

## 2020-04-30 DIAGNOSIS — Z9489 Other transplanted organ and tissue status: Secondary | ICD-10-CM | POA: Diagnosis not present

## 2020-04-30 DIAGNOSIS — I10 Essential (primary) hypertension: Secondary | ICD-10-CM | POA: Diagnosis not present

## 2020-04-30 DIAGNOSIS — Z7984 Long term (current) use of oral hypoglycemic drugs: Secondary | ICD-10-CM | POA: Diagnosis not present

## 2020-04-30 DIAGNOSIS — Z791 Long term (current) use of non-steroidal anti-inflammatories (NSAID): Secondary | ICD-10-CM | POA: Diagnosis not present

## 2020-04-30 DIAGNOSIS — M8568 Other cyst of bone, other site: Secondary | ICD-10-CM | POA: Diagnosis not present

## 2020-04-30 DIAGNOSIS — Z79899 Other long term (current) drug therapy: Secondary | ICD-10-CM | POA: Diagnosis not present

## 2020-04-30 LAB — CBC
HCT: 29.6 % — ABNORMAL LOW (ref 36.0–46.0)
Hemoglobin: 8.9 g/dL — ABNORMAL LOW (ref 12.0–15.0)
MCH: 30.3 pg (ref 26.0–34.0)
MCHC: 30.1 g/dL (ref 30.0–36.0)
MCV: 100.7 fL — ABNORMAL HIGH (ref 80.0–100.0)
Platelets: 183 10*3/uL (ref 150–400)
RBC: 2.94 MIL/uL — ABNORMAL LOW (ref 3.87–5.11)
RDW: 13.9 % (ref 11.5–15.5)
WBC: 7.7 10*3/uL (ref 4.0–10.5)
nRBC: 0 % (ref 0.0–0.2)

## 2020-04-30 MED ORDER — METHOCARBAMOL 500 MG PO TABS: 500.0000 mg | ORAL_TABLET | Freq: Four times a day (QID) | ORAL | 1 refills | Status: DC | PRN

## 2020-04-30 NOTE — Discharge Summary (Signed)
In most cases prophylactic antibiotics for Dental procdeures after total joint surgery are not necessary.  Exceptions are as follows:  1. History of prior total joint infection  2. Severely immunocompromised (Organ Transplant, cancer chemotherapy, Rheumatoid biologic meds such as [place])  3. Poorly controlled diabetes (A1C &gt; 8.0, blood glucose over 200)  If you have one of these conditions, contact your surgeon for an antibiotic prescription, prior to your dental procedure. Orthopedic Discharge Summary        Physician Discharge Summary  Patient ID: Misty [name] MRN: [id number] DOB/AGE: 1960/12/25 60 y.o.  Admit date: 04/28/2020 Discharge date: 04/30/2020   Procedures:  Procedure(s) (LRB): TOTAL HIP ARTHROPLASTY ANTERIOR APPROACH (Left)  Attending Physician:  Dr. Rod Can  Admission Diagnoses:   Left hip osteoarthritis  Discharge Diagnoses:  Left hip osteoarthritis   Past Medical History:  Diagnosis Date  . Allergy   . Arthritis   . Back pain   . Colon cancer ([place])   . Depression   . Diarrhea   . GERD (gastroesophageal reflux disease)   . Hypertension   . Insomnia   . MS (multiple sclerosis) ([place]) 2000   pt says that she doesn't have it  . Other hammer toe (acquired) 03/31/2013  . Pneumonia    hx of  . Umbilical hernia     PCP: [name], MD   Discharged Condition: good  Hospital Course:  Patient underwent the above stated procedure on 04/28/2020. Patient tolerated the procedure well and brought to the recovery room in good condition and subsequently to the floor. Patient had an uncomplicated hospital course and was stable for discharge.   Disposition: Discharge disposition: 01-Home or Self Care      with follow up in 2 weeks    Follow-up Information    [place], Aaron Edelman, MD. Schedule an appointment as soon as possible for a visit in 2 week(s).   Specialty: Orthopedic Surgery Why: For wound re-check Contact information: [address] [place] [address] [phone]               Dental Antibiotics:  In most cases prophylactic antibiotics for Dental procdeures after total joint surgery are not necessary.  Exceptions are as follows:  1. History of prior total joint infection  2. Severely immunocompromised (Organ Transplant, cancer chemotherapy, Rheumatoid biologic meds such as [place])  3. Poorly controlled diabetes (A1C &gt; 8.0, blood glucose over 200)  If you have one of these conditions, contact your surgeon for an antibiotic prescription, prior to your dental procedure.  Discharge Instructions    Call MD / Call 911   Complete by: As directed    If you experience chest pain or shortness of breath, CALL 911 and be transported to the hospital emergency room.  If you develope a fever above 101 F, pus (white drainage) or increased drainage or redness at the wound, or calf pain, call your surgeon's office.   Constipation Prevention   Complete by: As directed    Drink plenty of fluids.  Prune juice may be helpful.  You may use a stool softener, such as Colace (over the counter) 100 mg twice a day.  Use MiraLax (over the counter) for constipation as needed.   Diet - low sodium heart healthy   Complete by: As directed    Increase activity slowly as tolerated   Complete by: As directed       Allergies as of 04/30/2020      Reactions  Phenergan [promethazine Hcl] Anxiety   Trazodone And Nefazodone Rash      Medication List    STOP taking these medications   acetaminophen 650 MG CR tablet Commonly known as: TYLENOL   aspirin EC 81 MG tablet Replaced by: aspirin 81 MG chewable tablet   Oxycodone HCl 10 MG Tabs     TAKE these medications   albuterol 108 (90 Base) MCG/ACT inhaler Commonly known as: ProAir HFA Inhale 1-2 puffs into the lungs every 6 (six) hours as needed for wheezing or shortness of breath.   aspirin 81 MG chewable tablet Chew 1 tablet (81 mg total)  by mouth 2 (two) times daily. Replaces: aspirin EC 81 MG tablet   atorvastatin 20 MG tablet Commonly known as: LIPITOR Take 1 tablet by mouth once daily   cyanocobalamin 1000 MCG/ML injection Commonly known as: (VITAMIN B-12) Inject 1,000 mcg into the muscle every 30 (thirty) days. Every 30 days, first dose on Thursday   cyclobenzaprine 5 MG tablet Commonly known as: FLEXERIL Take 5 mg by mouth 3 (three) times daily as needed for muscle spasms.   diclofenac Sodium 1 % Gel Commonly known as: VOLTAREN APPLY 2 GRAMS TOPICALLY 4 TIMES DAILY What changed: See the new instructions.   docusate sodium 100 MG capsule Commonly known as: Colace Take 1 capsule (100 mg total) by mouth 2 (two) times daily as needed for mild constipation.   gabapentin 300 MG capsule Commonly known as: NEURONTIN TAKE 2 CAPSULES BY MOUTH IN THE MORNING AND  2  CAPSULES  IN  THE  AFTERNOON  AND  3  CAPSULES  EVERY  DAY  AT  BEDTIME   hydrochlorothiazide 12.5 MG capsule Commonly known as: MICROZIDE Take 12.5 mg by mouth daily as needed (swelling/high bp).   HYDROmorphone 4 MG tablet Commonly known as: DILAUDID Take 0.5 tablets (2 mg total) by mouth every 4 (four) hours as needed for severe pain.   lisinopril 20 MG tablet Commonly known as: ZESTRIL Take 1 tablet by mouth once daily   meloxicam 15 MG tablet Commonly known as: MOBIC Take 1 tablet by mouth once daily with food   methocarbamol 500 MG tablet Commonly known as: ROBAXIN Take 1 tablet (500 mg total) by mouth every 6 (six) hours as needed for muscle spasms.   multivitamin Liqd Take 30 mLs by mouth daily.   naloxone 4 MG/0.1ML Liqd nasal spray kit Commonly known as: NARCAN Place 1 spray into the nose as needed (opioid overdose).   ondansetron 4 MG tablet Commonly known as: ZOFRAN TAKE 1 TABLET BY MOUTH EVERY 6 HOURS AS NEEDED FOR NAUSEA   senna 8.6 MG Tabs tablet Commonly known as: SENOKOT Take 2 tablets (17.2 mg total) by mouth at  bedtime.   SYRINGE 3CC/25GX1" 25G X 1" 3 ML Misc Use for monthly B12 injections   tizanidine 2 MG capsule Commonly known as: ZANAFLEX Take 1 capsule (2 mg total) by mouth at bedtime as needed for muscle spasms.   triamcinolone cream 0.5 % Commonly known as: KENALOG Apply 1 application topically daily as needed (rash/dry skin).   Xtampza ER 18 MG C12a Generic drug: oxyCODONE ER Take 18 mg by mouth every 12 (twelve) hours.            Durable Medical Equipment  (From admission, onward)         Start     Ordered   04/29/20 1605  For home use only DME Walker rolling  Once  Question Answer Comment  Walker: With [place] Wheels   Patient needs a walker to treat with the following condition S/P total hip arthroplasty      04/29/20 1605            Signed: Ventura Bruns 04/30/2020, 8:16 AM  [facility] is now [facility] [address] [place], [place], [place] [address] Phone: [place]

## 2020-04-30 NOTE — Plan of Care (Signed)
Patient dc'd all careplans met 

## 2020-04-30 NOTE — Progress Notes (Signed)
   Subjective: 2 Days Post-Op Procedure(s) (LRB): TOTAL HIP ARTHROPLASTY ANTERIOR APPROACH (Left)  Pt doing well Minimal pain/soreness in the left hip Denies any new symptoms or issues Ready for d/c home Patient reports pain as mild.  Objective:   VITALS:   Vitals:   04/29/20 2131 04/30/20 0550  BP: 139/78 127/74  Pulse: 71 73  Resp: 17 18  Temp: 98.5 F (36.9 C) 98.1 F (36.7 C)  SpO2: 98% 98%    Left hip incision healing well nv intact distally No rashes or edema distally No pain with gentle rom  LABS Recent Labs    04/29/20 0321 04/30/20 0314  HGB 8.3* 8.9*  HCT 28.2* 29.6*  WBC 5.1 7.7  PLT 164 183    Recent Labs    04/29/20 0321  NA 136  K 4.5  BUN 24*  CREATININE 0.95  GLUCOSE 115*     Assessment/Plan: 2 Days Post-Op Procedure(s) (LRB): TOTAL HIP ARTHROPLASTY ANTERIOR APPROACH (Left) D/c home today after therapy F/u in 2 weeks in the office Pain management   [facility] [place], [place] is now [facility]  [facility] [place] [address] [place], [place], [place] [address] Phone: [phone] [email or web address] Facebook  [employer]

## 2020-04-30 NOTE — Progress Notes (Signed)
Physical Therapy Treatment Patient Details Name: Misty [name] MRN: [id number] DOB: 06-Jul-1960 Today's Date: 04/30/2020    History of Present Illness Patient is 60 y.o. female s/p Lt THA anterior approach on 04/28/20 with PMH significant for HTN, GERD, MS, Colon Cancer, OA, depression, ACDF (1 level), gastric bypass.    PT Comments    Pt performed HEP with assist.  Written program provided and reviewed - including progression to standing. therex    Follow Up Recommendations  Follow surgeon's recommendation for DC plan and follow-up therapies     Equipment Recommendations  Rolling walker with 5" wheels    Recommendations for Other Services       Precautions / Restrictions Precautions Precautions: Fall Restrictions Weight Bearing Restrictions: No Other Position/Activity Restrictions: WBAT    Mobility  Bed Mobility Overal bed mobility: Needs Assistance Bed Mobility: Supine to Sit     Supine to sit: Min guard     General bed mobility comments: Increased time with cues for sequence and use of R LE to self assist    Transfers                    Ambulation/Gait                 Stairs             Wheelchair Mobility    Modified Rankin (Stroke Patients Only)       Balance Overall balance assessment: Needs assistance Sitting-balance support: Feet supported Sitting balance-Leahy Scale: Good                                      Cognition Arousal/Alertness: Awake/alert Behavior During Therapy: WFL for tasks assessed/performed Overall Cognitive Status: Within Functional Limits for tasks assessed                                        Exercises Total Joint Exercises Ankle Circles/Pumps: AROM;Both;20 reps;Seated Quad Sets: AROM;Both;10 reps;Seated Heel Slides: AAROM;Left;20 reps;Supine Hip ABduction/ADduction: AAROM;Left;15 reps;Supine Long Arc Quad: AROM;Left;10 reps;Seated    General Comments         Pertinent Vitals/Pain Pain Assessment: 0-10 Pain Score: 4  Pain Location: L hip/thigh Pain Descriptors / Indicators: Aching;Sore Pain Intervention(s): Limited activity within patient's tolerance;Monitored during session;Premedicated before session    Home Living                      Prior Function            PT Goals (current goals can now be found in the care plan section) Acute Rehab PT Goals Patient Stated Goal: get back to independence PT Goal Formulation: With patient Time For Goal Achievement: 05/05/20 Potential to Achieve Goals: Good Progress towards PT goals: Progressing toward goals    Frequency    7X/week      PT Plan Current plan remains appropriate    Co-evaluation              AM-PAC PT "6 Clicks" Mobility   Outcome Measure  Help needed turning from your back to your side while in a flat bed without using bedrails?: None Help needed moving from lying on your back to sitting on the side of a flat bed without using bedrails?: A Little Help needed moving to  and from a bed to a chair (including a wheelchair)?: A Little Help needed standing up from a chair using your arms (e.g., wheelchair or bedside chair)?: A Little Help needed to walk in hospital room?: A Little Help needed climbing 3-5 steps with a railing? : A Lot 6 Click Score: 18    End of Session Equipment Utilized During Treatment: Gait belt Activity Tolerance: Patient tolerated treatment well Patient left: Other (comment) (sitting EOB) Nurse Communication: Mobility status PT Visit Diagnosis: Muscle weakness (generalized) (M62.81);Difficulty in walking, not elsewhere classified (R26.2)     Time: 6754-4920 PT Time Calculation (min) (ACUTE ONLY): 25 min  Charges:  $Therapeutic Exercise: 23-37 mins                     [place] Pager [phone] Office [phone]    Sherell Christoffel 04/30/2020, 12:21 PM

## 2020-04-30 NOTE — Progress Notes (Signed)
Lab called regarding pre-op mrsa pcr that is showing in orders. Patient slated for discharge today, will defer to dayshift to see if this is something that needs collected at this time.

## 2020-04-30 NOTE — Progress Notes (Signed)
Physical Therapy Treatment Patient Details Name: Misty [name] MRN: [id number] DOB: 1960/08/02 Today's Date: 04/30/2020    History of Present Illness Patient is 60 y.o. female s/p Lt THA anterior approach on 04/28/20 with PMH significant for HTN, GERD, MS, Colon Cancer, OA, depression, ACDF (1 level), gastric bypass.    PT Comments    Pt progressing well with mobility and up to ambulate increased distance in hall, negotiate stairs and review car transfers.  Pt eager for dc home this date.   Follow Up Recommendations  Follow surgeon's recommendation for DC plan and follow-up therapies     Equipment Recommendations  Rolling walker with 5" wheels    Recommendations for Other Services       Precautions / Restrictions Precautions Precautions: Fall Restrictions Weight Bearing Restrictions: No Other Position/Activity Restrictions: WBAT    Mobility  Bed Mobility Overal bed mobility: Needs Assistance Bed Mobility: Sit to Supine;Supine to Sit     Supine to sit: Min guard Sit to supine: Min guard   General bed mobility comments: Increased time with cues for sequence and use of R LE and gait belt to self assist    Transfers Overall transfer level: Needs assistance Equipment used: Rolling walker (2 wheeled) Transfers: Sit to/from Stand Sit to Stand: Supervision         General transfer comment: cues for LE management and use of UEs to self assist  Ambulation/Gait Ambulation/Gait assistance: Min guard;Supervision Gait Distance (Feet): 150 Feet Assistive device: Rolling walker (2 wheeled) Gait Pattern/deviations: Decreased step length - right;Decreased step length - left;Shuffle;Trunk flexed;Step-to pattern;Step-through pattern Gait velocity: decr   General Gait Details: Increased time with cues for sequence, posture and position from RW   Stairs Stairs: Yes Stairs assistance: Min assist Stair Management: One rail Left;Step to pattern;Forwards;With cane;No  rails;Backwards;With walker Number of Stairs: 7 General stair comments: single step bkwd twice with RW; 5 steps fwd with rail and cane.  Cues for sequence and foot/AD placement   Wheelchair Mobility    Modified Rankin (Stroke Patients Only)       Balance Overall balance assessment: Mild deficits observed, not formally tested Sitting-balance support: Feet supported Sitting balance-Leahy Scale: Good                                      Cognition Arousal/Alertness: Awake/alert Behavior During Therapy: WFL for tasks assessed/performed Overall Cognitive Status: Within Functional Limits for tasks assessed                                        Exercises Total Joint Exercises Ankle Circles/Pumps: AROM;Both;20 reps;Seated Quad Sets: AROM;Both;10 reps;Seated Heel Slides: AAROM;Left;20 reps;Supine Hip ABduction/ADduction: AAROM;Left;15 reps;Supine Long Arc Quad: AROM;Left;10 reps;Seated    General Comments        Pertinent Vitals/Pain Pain Assessment: 0-10 Pain Score: 4  Pain Location: L hip/thigh Pain Descriptors / Indicators: Aching;Sore Pain Intervention(s): Limited activity within patient's tolerance;Monitored during session;Premedicated before session    Home Living                      Prior Function            PT Goals (current goals can now be found in the care plan section) Acute Rehab PT Goals Patient Stated Goal: get back to independence PT  Goal Formulation: With patient Time For Goal Achievement: 05/05/20 Potential to Achieve Goals: Good Progress towards PT goals: Progressing toward goals    Frequency    7X/week      PT Plan Current plan remains appropriate    Co-evaluation              AM-PAC PT "6 Clicks" Mobility   Outcome Measure  Help needed turning from your back to your side while in a flat bed without using bedrails?: None Help needed moving from lying on your back to sitting on the side  of a flat bed without using bedrails?: A Little Help needed moving to and from a bed to a chair (including a wheelchair)?: A Little Help needed standing up from a chair using your arms (e.g., wheelchair or bedside chair)?: A Little Help needed to walk in hospital room?: A Little Help needed climbing 3-5 steps with a railing? : A Little 6 Click Score: 19    End of Session Equipment Utilized During Treatment: Gait belt Activity Tolerance: Patient tolerated treatment well Patient left: Other (comment) (in bathroom - RN aware) Nurse Communication: Mobility status PT Visit Diagnosis: Muscle weakness (generalized) (M62.81);Difficulty in walking, not elsewhere classified (R26.2)     Time: 9381-8299 PT Time Calculation (min) (ACUTE ONLY): 27 min  Charges:  $Gait Training: 8-22 mins $Therapeutic Exercise: 23-37 mins $Therapeutic Activity: 8-22 mins                     Debe Coder PT Acute Rehabilitation Services Pager [phone] Office [phone]    [facility] 04/30/2020, 12:37 PM

## 2020-04-30 NOTE — Plan of Care (Signed)
  Problem: Activity: Goal: Risk for activity intolerance will decrease Outcome: Progressing   Problem: Pain Managment: Goal: General experience of comfort will improve Outcome: Progressing   Problem: Pain Management: Goal: Pain level will decrease with appropriate interventions Outcome: Progressing

## 2020-05-10 ENCOUNTER — Ambulatory Visit (INDEPENDENT_AMBULATORY_CARE_PROVIDER_SITE_OTHER): Payer: Medicare Other | Admitting: Pharmacist

## 2020-05-10 ENCOUNTER — Other Ambulatory Visit: Payer: Self-pay

## 2020-05-10 DIAGNOSIS — E7849 Other hyperlipidemia: Secondary | ICD-10-CM

## 2020-05-10 DIAGNOSIS — I10 Essential (primary) hypertension: Secondary | ICD-10-CM | POA: Diagnosis not present

## 2020-05-10 DIAGNOSIS — Z96642 Presence of left artificial hip joint: Secondary | ICD-10-CM | POA: Diagnosis not present

## 2020-05-10 DIAGNOSIS — Z471 Aftercare following joint replacement surgery: Secondary | ICD-10-CM | POA: Diagnosis not present

## 2020-05-10 DIAGNOSIS — G8929 Other chronic pain: Secondary | ICD-10-CM
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
## 2020-05-10 NOTE — Patient Instructions (Signed)
Visit Information  Phone number for Pharmacist: [phone]  Goals Addressed   None    Patient Care Plan: CCM Pharmacy Care Plan    Problem Identified: Hypertension, Hyperlipidemia and Chronic pain   Priority: High    Long-Range Goal: Disease management   Start Date: 05/10/2020  Expected End Date: 05/10/2021  This Visit's Progress: On track  Priority: High  Note:   Current Barriers:  . Unable to independently monitor therapeutic efficacy  Pharmacist Clinical Goal(s):  Marland Kitchen Over the next 180 days, patient will achieve adherence to monitoring guidelines and medication adherence to achieve therapeutic efficacy through collaboration with PharmD and provider.   Interventions: . 1:1 collaboration with [name], MD regarding development and update of comprehensive plan of care as evidenced by provider attestation and co-signature . Inter-disciplinary care team collaboration (see longitudinal plan of care) . Comprehensive medication review performed; medication list updated in electronic medical record  Hypertension (BP goal < 130/80) Controlled Current regimen:  ? Lisinopril 20 mg daily ? HCTZ 12.5 mg daily as needed for swelling or BP Interventions: ? Discussed blood pressure goals and importance of maintaining BP in optimal range 100/60 to 130/80 Patient self care activities ? Check BP daily, document, and provide at future appointments ? Ensure daily salt intake < 2300 mg/day   Hyperlipidemia (LDL goal < 100) Not ideally controlled - but improving, LDL down from 144 to 118 from July 2021 to Jan 2022 after statin added Current regimen:  ? Atorvastatin 20 mg daily ? Aspirin 61 m gdaily Interventions: ? Discussed cholesterol goals and benefits of medications for prevention of heart attack / stroke Patient self care activities ? Continue to exercise 3-5 days weekly, both weights and cardio ? Limit high-cholesterol foods like fried foods, red meat, and eggs   Chronic Pain  (hip, back) Controlled - managed per Dr Vira Blanco (pain mgmt) Current regimen:  ? Hydromorphone 4 mg - 1/2 tablet every 4 hrs PRN ? Xtampza (oxycodone) ER 13.5 mg twice a day ? Gabapentin 300 mg - 1 AM, 2 PM and 2 HS ? Diclofenac 1% gel: 2-3 times daily  ? Meloxicam 15 mg daily ? Methocarbamol 500 mg every 6 hrs PRN Interventions: ? Discussed importance of following up with pain managements and adhering to physical therapy recommendations ? Discussed importance of medication organization to prevent overdose ? Discussed benefits of weight loss on joint pain ? Advised to use Tylenol up to 3000 mg daily in addition to prescription medications Patient self care activities ? Use a pill box to organize oxycodone  ? Keep Narcan on hand in case of accidental overdose ? Follow with pain management and adhere to recommendations ? Continue exercising 3-5 days per week to lose weight for hip surgery  Patient Goals/Self-Care Activities . Over the next 90 days, patient will:  - take medications as prescribed focus on medication adherence by pill box check blood pressure daily, document, and provide at future appointments  Follow Up Plan: Telephone follow up appointment with care management team member scheduled for: 6 months     The patient verbalized understanding of instructions, educational materials, and care plan provided today and declined offer to receive copy of patient instructions, educational materials, and care plan.  Telephone follow up appointment with pharmacy team member scheduled for: 6 months  Charlene Brooke, PharmD, [facility] Clinical Pharmacist [facility] [phone]

## 2020-05-11 ENCOUNTER — Other Ambulatory Visit (HOSPITAL_COMMUNITY): Payer: Self-pay | Admitting: Orthopedic Surgery

## 2020-05-11 DIAGNOSIS — M79662 Pain in left lower leg: Secondary | ICD-10-CM

## 2020-05-11 DIAGNOSIS — G8918 Other acute postprocedural pain: Secondary | ICD-10-CM

## 2020-05-11 DIAGNOSIS — M7989 Other specified soft tissue disorders: Secondary | ICD-10-CM

## 2020-05-12 ENCOUNTER — Other Ambulatory Visit: Payer: Self-pay

## 2020-05-12 ENCOUNTER — Ambulatory Visit (HOSPITAL_COMMUNITY)
Admission: RE | Admit: 2020-05-12 | Discharge: 2020-05-12 | Disposition: A | Discharge status: Home / Self Care | Payer: Medicare Other | Source: Ambulatory Visit | Attending: Cardiovascular Disease | Admitting: Cardiovascular Disease

## 2020-05-12 DIAGNOSIS — G8918 Other acute postprocedural pain: Secondary | ICD-10-CM | POA: Diagnosis not present

## 2020-05-12 DIAGNOSIS — M79662 Pain in left lower leg: Secondary | ICD-10-CM

## 2020-05-12 DIAGNOSIS — M7989 Other specified soft tissue disorders: Secondary | ICD-10-CM | POA: Insufficient documentation

## 2020-05-12 DIAGNOSIS — Z5189 Encounter for other specified aftercare: Secondary | ICD-10-CM | POA: Insufficient documentation

## 2020-05-13 DIAGNOSIS — Z96642 Presence of left artificial hip joint: Secondary | ICD-10-CM | POA: Diagnosis not present

## 2020-05-13 DIAGNOSIS — Z471 Aftercare following joint replacement surgery: Secondary | ICD-10-CM | POA: Diagnosis not present

## 2020-05-16 ENCOUNTER — Other Ambulatory Visit: Payer: Self-pay | Admitting: Internal Medicine

## 2020-05-21 DIAGNOSIS — G8929 Other chronic pain: Secondary | ICD-10-CM | POA: Diagnosis not present

## 2020-05-21 DIAGNOSIS — M25551 Pain in right hip: Secondary | ICD-10-CM | POA: Diagnosis not present

## 2020-05-25 ENCOUNTER — Ambulatory Visit: Payer: Medicare Other | Admitting: Podiatry

## 2020-05-30 DIAGNOSIS — M47817 Spondylosis without myelopathy or radiculopathy, lumbosacral region: Secondary | ICD-10-CM | POA: Diagnosis not present

## 2020-05-30 DIAGNOSIS — G894 Chronic pain syndrome: Secondary | ICD-10-CM | POA: Diagnosis not present

## 2020-05-30 DIAGNOSIS — M5136 Other intervertebral disc degeneration, lumbar region: Secondary | ICD-10-CM | POA: Diagnosis not present

## 2020-05-30 DIAGNOSIS — M160 Bilateral primary osteoarthritis of hip: Secondary | ICD-10-CM | POA: Diagnosis not present

## 2020-05-31 ENCOUNTER — Ambulatory Visit: Payer: Self-pay | Admitting: Student

## 2020-05-31 ENCOUNTER — Ambulatory Visit: Payer: Self-pay | Admitting: Orthopedic Surgery

## 2020-05-31 NOTE — Progress Notes (Addendum)
COVID Vaccine Completed: x3 Date COVID Vaccine completed:  Unsure of dates Has received booster:  02-12-20 COVID vaccine manufacturer: Pfizer      Date of COVID positive in last 83 days:N/A  PCP - Billey Gosling, MD Cardiologist -   Chest x-ray - N/A EKG - 03-21-20 Epic Stress Test -  ECHO -  Cardiac Cath -  Pacemaker/ICD device last checked: Spinal Cord Stimulator:  Sleep Study - N/A CPAP -   Fasting Blood Sugar - N/A Checks Blood Sugar _____ times a day  Blood Thinner Instructions: Aspirin Instructions:  ASA 81 mg Last Dose:  05-30-20  Activity level:  Can go up a flight of stairs and perform activities of daily living without stopping and without symptoms of chest pain or shortness of breath.  Has some limitations due to hip pain.  Patient lives alone     Anesthesia review: N/A  Patient denies shortness of breath, fever, cough and chest pain at PAT appointment   Patient verbalized understanding of instructions that were given to them at the PAT appointment. Patient was also instructed that they will need to review over the PAT instructions again at home before surgery.

## 2020-05-31 NOTE — Patient Instructions (Addendum)
DUE TO COVID-19 ONLY ONE VISITOR IS ALLOWED TO COME WITH YOU AND STAY IN THE WAITING ROOM ONLY DURING PRE OP AND PROCEDURE.   IF YOU WILL BE ADMITTED INTO THE HOSPITAL YOU ARE ALLOWED ONLY TWO SUPPORT PEOPLE DURING VISITATION HOURS ONLY (10AM -8PM)   . The support person(s) may change daily. . The support person(s) must pass our screening, gel in and out, and wear a mask at all times, including in the patient's room. . Patients must also wear a mask when staff or their support person are in the room.  No visitors under the age of 54. Any visitor under the age of 35 must be accompanied by an adult.    COVID SWAB TESTING MUST BE COMPLETED ON:  06-01-20 @ 9:30 AM   [address] [place],  [address]  (Must self quarantine after testing. Follow instructions on handout.)    Your procedure is scheduled on: Thursday, 06-02-20   Report to [facility] Main  Entrance   Report to admitting at 11:00 AM   Call this number if you have problems the morning of surgery [phone]   Do not eat food :After Midnight.   May have liquids until 10:00 AM day of surgery  CLEAR LIQUID DIET  Foods Allowed                                                                     Foods Excluded  Water, Black Coffee and tea, regular and decaf               liquids that you cannot  Plain Jell-O in any flavor  (No red)                                     see through such as: Fruit ices (not with fruit pulp)                                      milk, soups, orange juice              Iced Popsicles (No red)                                      All solid food                                   Apple juices Sports drinks like Gatorade (No red) Lightly seasoned clear broth or consume(fat free) Sugar, honey syrup       Oral Hygiene is also important to reduce your risk of infection.                                    Remember - BRUSH YOUR TEETH THE MORNING OF SURGERY WITH YOUR REGULAR TOOTHPASTE   Do  NOT smoke after Midnight   Take these medicines  the morning of surgery with A SIP OF WATER:  Atorvastatin, Gabapentin, Xtampza                              You may not have any metal on your body including hair pins, jewelry, and body piercings             Do not wear make-up, lotions, powders, perfumes/cologne, or deodorant             Do not wear nail polish.  Do not shave  48 hours prior to surgery.    Do not bring valuables to the hospital. [place].   Contacts, dentures or bridgework may not be worn into surgery.   Bring small overnight bag day of surgery.               Please read over the following fact sheets you were given: IF YOU HAVE QUESTIONS ABOUT YOUR PRE OP INSTRUCTIONS PLEASE CALL  [facility] - Preparing for Surgery Before surgery, you can play an important role.  Because skin is not sterile, your skin needs to be as free of germs as possible.  You can reduce the number of germs on your skin by washing with CHG (chlorahexidine gluconate) soap before surgery.  CHG is an antiseptic cleaner which kills germs and bonds with the skin to continue killing germs even after washing. Please DO NOT use if you have an allergy to CHG or antibacterial soaps.  If your skin becomes reddened/irritated stop using the CHG and inform your nurse when you arrive at Short Stay. Do not shave (including legs and underarms) for at least 48 hours prior to the first CHG shower.  You may shave your face/neck.  Please follow these instructions carefully:  1.  Shower with CHG Soap the night before surgery and the  morning of surgery.  2.  If you choose to wash your hair, wash your hair first as usual with your normal  shampoo.  3.  After you shampoo, rinse your hair and body thoroughly to remove the shampoo.                             4.  Use CHG as you would any other liquid soap.  You can apply chg directly to the skin and wash.   Gently with a scrungie or clean washcloth.  5.  Apply the CHG Soap to your body ONLY FROM THE NECK DOWN.   Do   not use on face/ open                           Wound or open sores. Avoid contact with eyes, ears mouth and   genitals (private parts).                       Wash face,  Genitals (private parts) with your normal soap.             6.  Wash thoroughly, paying special attention to the area where your    surgery  will be performed.  7.  Thoroughly rinse your body with warm water from the neck down.  8.  DO NOT shower/wash with your  normal soap after using and rinsing off the CHG Soap.                9.  Pat yourself dry with a clean towel.            10.  Wear clean pajamas.            11.  Place clean sheets on your bed the night of your first shower and do not  sleep with pets. Day of Surgery : Do not apply any lotions/deodorants the morning of surgery.  Please wear clean clothes to the hospital/surgery center.  FAILURE TO FOLLOW THESE INSTRUCTIONS MAY RESULT IN THE CANCELLATION OF YOUR SURGERY  PATIENT SIGNATURE_________________________________  NURSE SIGNATURE__________________________________  ________________________________________________________________________    Misty [name]  An incentive spirometer is a tool that can help keep your lungs clear and active. This tool measures how well you are filling your lungs with each breath. Taking long deep breaths may help reverse or decrease the chance of developing breathing (pulmonary) problems (especially infection) following:  A long period of time when you are unable to move or be active. BEFORE THE PROCEDURE   If the spirometer includes an indicator to show your best effort, your nurse or respiratory therapist will set it to a desired goal.  If possible, sit up straight or lean slightly forward. Try not to slouch.  Hold the incentive spirometer in an upright position. INSTRUCTIONS FOR USE  1. Sit on the edge  of your bed if possible, or sit up as far as you can in bed or on a chair. 2. Hold the incentive spirometer in an upright position. 3. Breathe out normally. 4. Place the mouthpiece in your mouth and seal your lips tightly around it. 5. Breathe in slowly and as deeply as possible, raising the piston or the ball toward the top of the column. 6. Hold your breath for 3-5 seconds or for as long as possible. Allow the piston or ball to fall to the bottom of the column. 7. Remove the mouthpiece from your mouth and breathe out normally. 8. Rest for a few seconds and repeat Steps 1 through 7 at least 10 times every 1-2 hours when you are awake. Take your time and take a few normal breaths between deep breaths. 9. The spirometer may include an indicator to show your best effort. Use the indicator as a goal to work toward during each repetition. 10. After each set of 10 deep breaths, practice coughing to be sure your lungs are clear. If you have an incision (the cut made at the time of surgery), support your incision when coughing by placing a pillow or rolled up towels firmly against it. Once you are able to get out of bed, walk around indoors and cough well. You may stop using the incentive spirometer when instructed by your caregiver.  RISKS AND COMPLICATIONS  Take your time so you do not get dizzy or light-headed.  If you are in pain, you may need to take or ask for pain medication before doing incentive spirometry. It is harder to take a deep breath if you are having pain. AFTER USE  Rest and breathe slowly and easily.  It can be helpful to keep track of a log of your progress. Your caregiver can provide you with a simple table to help with this. If you are using the spirometer at home, follow these instructions: [place] IF:   You are having difficultly using the spirometer.  You have trouble using the spirometer as often as instructed.  Your pain medication is not giving enough  relief while using the spirometer.  You develop fever of 100.5 F (38.1 C) or higher. SEEK IMMEDIATE MEDICAL CARE IF:   You cough up bloody sputum that had not been present before.  You develop fever of 102 F (38.9 C) or greater.  You develop worsening pain at or near the incision site. MAKE SURE YOU:   Understand these instructions.  Will watch your condition.  Will get help right away if you are not doing well or get worse. Document Released: 07/16/2006 Document Revised: 05/28/2011 Document Reviewed: 09/16/2006 Exit[employer] Patient Information 2014 Exit[employer], [place].   ________________________________________________________________________  WHAT IS A BLOOD TRANSFUSION? Blood Transfusion Information  A transfusion is the replacement of blood or some of its parts. Blood is made up of multiple cells which provide different functions.  Red blood cells carry oxygen and are used for blood loss replacement.  White blood cells fight against infection.  Platelets control bleeding.  Plasma helps clot blood.  Other blood products are available for specialized needs, such as hemophilia or other clotting disorders. BEFORE THE TRANSFUSION  Who gives blood for transfusions?   Healthy volunteers who are fully evaluated to make sure their blood is safe. This is blood bank blood. Transfusion therapy is the safest it has ever been in the practice of medicine. Before blood is taken from a donor, a complete history is taken to make sure that person has no history of diseases nor engages in risky social behavior (examples are intravenous drug use or sexual activity with multiple partners). The donor's travel history is screened to minimize risk of transmitting infections, such as malaria. The donated blood is tested for signs of infectious diseases, such as HIV and hepatitis. The blood is then tested to be sure it is compatible with you in order to minimize the chance of a transfusion reaction. If  you or a relative donates blood, this is often done in anticipation of surgery and is not appropriate for emergency situations. It takes many days to process the donated blood. RISKS AND COMPLICATIONS Although transfusion therapy is very safe and saves many lives, the main dangers of transfusion include:   Getting an infectious disease.  Developing a transfusion reaction. This is an allergic reaction to something in the blood you were given. Every precaution is taken to prevent this. The decision to have a blood transfusion has been considered carefully by your caregiver before blood is given. Blood is not given unless the benefits outweigh the risks. AFTER THE TRANSFUSION  Right after receiving a blood transfusion, you will usually feel much better and more energetic. This is especially true if your red blood cells have gotten low (anemic). The transfusion raises the level of the red blood cells which carry oxygen, and this usually causes an energy increase.  The nurse administering the transfusion will monitor you carefully for complications. HOME CARE INSTRUCTIONS  No special instructions are needed after a transfusion. You may find your energy is better. Speak with your caregiver about any limitations on activity for underlying diseases you may have. SEEK MEDICAL CARE IF:   Your condition is not improving after your transfusion.  You develop redness or irritation at the intravenous (IV) site. SEEK IMMEDIATE MEDICAL CARE IF:  Any of the following symptoms occur over the next 12 hours:  Shaking chills.  You have a temperature by mouth above 102 F (38.9 C), not  controlled by medicine.  Chest, back, or muscle pain.  People around you feel you are not acting correctly or are confused.  Shortness of breath or difficulty breathing.  Dizziness and fainting.  You get a rash or develop hives.  You have a decrease in urine output.  Your urine turns a dark color or changes to pink,  red, or brown. Any of the following symptoms occur over the next 10 days:  You have a temperature by mouth above 102 F (38.9 C), not controlled by medicine.  Shortness of breath.  Weakness after normal activity.  The white part of the eye turns yellow (jaundice).  You have a decrease in the amount of urine or are urinating less often.  Your urine turns a dark color or changes to pink, red, or brown. Document Released: 03/02/2000 Document Revised: 05/28/2011 Document Reviewed: 10/20/2007 [facility] Patient Information 2014 [place], [place].  _______________________________________________________________________

## 2020-06-01 ENCOUNTER — Encounter (HOSPITAL_COMMUNITY): Payer: Self-pay

## 2020-06-01 ENCOUNTER — Encounter (HOSPITAL_COMMUNITY): Payer: Self-pay | Admitting: Orthopedic Surgery

## 2020-06-01 ENCOUNTER — Other Ambulatory Visit (HOSPITAL_COMMUNITY)
Admission: RE | Admit: 2020-06-01 | Discharge: 2020-06-01 | Disposition: A | Discharge status: Home / Self Care | Payer: Medicare Other | Source: Ambulatory Visit | Attending: Orthopedic Surgery | Admitting: Orthopedic Surgery

## 2020-06-01 ENCOUNTER — Encounter (HOSPITAL_COMMUNITY)
Admission: RE | Admit: 2020-06-01 | Discharge: 2020-06-01 | Disposition: A | Discharge status: Home / Self Care | Payer: Medicare Other | Source: Ambulatory Visit | Attending: Orthopedic Surgery | Admitting: Orthopedic Surgery

## 2020-06-01 ENCOUNTER — Other Ambulatory Visit: Payer: Self-pay

## 2020-06-01 DIAGNOSIS — Z01812 Encounter for preprocedural laboratory examination: Secondary | ICD-10-CM | POA: Insufficient documentation

## 2020-06-01 DIAGNOSIS — Z20822 Contact with and (suspected) exposure to covid-19: Secondary | ICD-10-CM | POA: Insufficient documentation

## 2020-06-01 HISTORY — DX: Prediabetes: R73.03

## 2020-06-01 LAB — URINALYSIS, ROUTINE W REFLEX MICROSCOPIC
Bilirubin Urine: NEGATIVE
Glucose, UA: NEGATIVE mg/dL
Hgb urine dipstick: NEGATIVE
Ketones, ur: NEGATIVE mg/dL
Leukocytes,Ua: NEGATIVE
Nitrite: NEGATIVE
Protein, ur: NEGATIVE mg/dL
Specific Gravity, Urine: 1.023 (ref 1.005–1.030)
pH: 5 (ref 5.0–8.0)

## 2020-06-01 LAB — COMPREHENSIVE METABOLIC PANEL
ALT: 13 U/L (ref 0–44)
AST: 18 U/L (ref 15–41)
Albumin: 3.4 g/dL — ABNORMAL LOW (ref 3.5–5.0)
Alkaline Phosphatase: 136 U/L — ABNORMAL HIGH (ref 38–126)
Anion gap: 8 (ref 5–15)
BUN: 15 mg/dL (ref 6–20)
CO2: 25 mmol/L (ref 22–32)
Calcium: 9.2 mg/dL (ref 8.9–10.3)
Chloride: 107 mmol/L (ref 98–111)
Creatinine, Ser: 0.81 mg/dL (ref 0.44–1.00)
GFR, Estimated: >60 mL/min (ref >60)
Glucose, Bld: 75 mg/dL (ref 70–99)
Potassium: 3.8 mmol/L (ref 3.5–5.1)
Sodium: 140 mmol/L (ref 135–145)
Total Bilirubin: 0.6 mg/dL (ref 0.3–1.2)
Total Protein: 7 g/dL (ref 6.5–8.1)

## 2020-06-01 LAB — PROTIME-INR
INR: 1.1 (ref 0.8–1.2)
Prothrombin Time: 13.7 seconds (ref 11.4–15.2)

## 2020-06-01 LAB — CBC
HCT: 38 % (ref 36.0–46.0)
Hemoglobin: 11.3 g/dL — ABNORMAL LOW (ref 12.0–15.0)
MCH: 29.1 pg (ref 26.0–34.0)
MCHC: 29.7 g/dL — ABNORMAL LOW (ref 30.0–36.0)
MCV: 97.9 fL (ref 80.0–100.0)
Platelets: 208 10*3/uL (ref 150–400)
RBC: 3.88 MIL/uL (ref 3.87–5.11)
RDW: 14 % (ref 11.5–15.5)
WBC: 5.7 10*3/uL (ref 4.0–10.5)
nRBC: 0 % (ref 0.0–0.2)

## 2020-06-01 LAB — SURGICAL PCR SCREEN
MRSA, PCR: NEGATIVE
Staphylococcus aureus: NEGATIVE

## 2020-06-01 LAB — HEMOGLOBIN A1C
Hgb A1c MFr Bld: 5.3 % (ref 4.8–5.6)
Mean Plasma Glucose: 105.41 mg/dL

## 2020-06-01 LAB — SARS CORONAVIRUS 2 (TAT 6-24 HRS): SARS Coronavirus 2: NEGATIVE

## 2020-06-02 ENCOUNTER — Observation Stay (HOSPITAL_COMMUNITY)
Admission: RE | Admit: 2020-06-02 | Discharge: 2020-06-03 | Disposition: A | Discharge status: Home / Self Care | Payer: Medicare Other | Attending: Orthopedic Surgery | Admitting: Orthopedic Surgery

## 2020-06-02 ENCOUNTER — Other Ambulatory Visit: Payer: Self-pay

## 2020-06-02 ENCOUNTER — Inpatient Hospital Stay (HOSPITAL_COMMUNITY): Payer: Medicare Other | Admitting: Anesthesiology

## 2020-06-02 ENCOUNTER — Encounter (HOSPITAL_COMMUNITY): Payer: Self-pay | Admitting: Orthopedic Surgery

## 2020-06-02 ENCOUNTER — Encounter (HOSPITAL_COMMUNITY)
Admission: RE | Disposition: A | Discharge status: Home / Self Care | Payer: Self-pay | Source: Home / Self Care | Attending: Orthopedic Surgery

## 2020-06-02 DIAGNOSIS — Y839 Surgical procedure, unspecified as the cause of abnormal reaction of the patient, or of later complication, without mention of misadventure at the time of the procedure: Secondary | ICD-10-CM | POA: Diagnosis not present

## 2020-06-02 DIAGNOSIS — T8131XA Disruption of external operation (surgical) wound, not elsewhere classified, initial encounter: Principal | ICD-10-CM | POA: Diagnosis present

## 2020-06-02 DIAGNOSIS — I10 Essential (primary) hypertension: Secondary | ICD-10-CM | POA: Insufficient documentation

## 2020-06-02 DIAGNOSIS — E559 Vitamin D deficiency, unspecified: Secondary | ICD-10-CM | POA: Diagnosis not present

## 2020-06-02 DIAGNOSIS — Z85038 Personal history of other malignant neoplasm of large intestine: Secondary | ICD-10-CM | POA: Insufficient documentation

## 2020-06-02 DIAGNOSIS — Z87891 Personal history of nicotine dependence: Secondary | ICD-10-CM | POA: Insufficient documentation

## 2020-06-02 DIAGNOSIS — Z7982 Long term (current) use of aspirin: Secondary | ICD-10-CM | POA: Insufficient documentation

## 2020-06-02 DIAGNOSIS — Z79899 Other long term (current) drug therapy: Secondary | ICD-10-CM | POA: Diagnosis not present

## 2020-06-02 DIAGNOSIS — R7303 Prediabetes: Secondary | ICD-10-CM | POA: Insufficient documentation

## 2020-06-02 DIAGNOSIS — K219 Gastro-esophageal reflux disease without esophagitis: Secondary | ICD-10-CM | POA: Diagnosis not present

## 2020-06-02 DIAGNOSIS — Z96642 Presence of left artificial hip joint: Secondary | ICD-10-CM | POA: Diagnosis not present

## 2020-06-02 DIAGNOSIS — T84091A Other mechanical complication of internal left hip prosthesis, initial encounter: Secondary | ICD-10-CM | POA: Diagnosis not present

## 2020-06-02 DIAGNOSIS — E119 Type 2 diabetes mellitus without complications: Secondary | ICD-10-CM | POA: Diagnosis not present

## 2020-06-02 HISTORY — PX: INCISION AND DRAINAGE HIP: SHX1801

## 2020-06-02 LAB — TYPE AND SCREEN
ABO/RH(D): O POS
Antibody Screen: NEGATIVE

## 2020-06-02 SURGERY — IRRIGATION AND DEBRIDEMENT HIP
Anesthesia: General | Site: Hip | Laterality: Left

## 2020-06-02 MED ORDER — METHOCARBAMOL 1000 MG/10ML IJ SOLN
500.0000 mg | Freq: Four times a day (QID) | INTRAVENOUS | Status: DC | PRN
  Filled 2020-06-02: qty 5

## 2020-06-02 MED ORDER — HYDROMORPHONE HCL 1 MG/ML IJ SOLN
0.2500 mg | INTRAMUSCULAR | Status: DC | PRN
  Administered 2020-06-02 (×2): 0.5 mg via INTRAVENOUS
  Administered 2020-06-02: 0.25 mg via INTRAVENOUS

## 2020-06-02 MED ORDER — CHLORHEXIDINE GLUCONATE 0.12 % MT SOLN
15.0000 mL | Freq: Once | OROMUCOSAL | Status: AC
  Administered 2020-06-02: 15 mL via OROMUCOSAL

## 2020-06-02 MED ORDER — OXYCODONE ER 18 MG PO C12A: 18.0000 mg | EXTENDED_RELEASE_CAPSULE | Freq: Two times a day (BID) | ORAL | Status: DC

## 2020-06-02 MED ORDER — ACETAMINOPHEN 325 MG PO TABS: 325.0000 mg | ORAL_TABLET | Freq: Four times a day (QID) | ORAL | Status: DC | PRN

## 2020-06-02 MED ORDER — OXYCODONE HCL 5 MG PO TABS
5.0000 mg | ORAL_TABLET | ORAL | Status: DC | PRN
Start: 2020-06-02 — End: 2020-06-03
  Administered 2020-06-03: 10 mg via ORAL
  Filled 2020-06-02: qty 2

## 2020-06-02 MED ORDER — PROPOFOL 10 MG/ML IV BOLUS
INTRAVENOUS | Status: DC | PRN
  Administered 2020-06-02 (×2): 50 mg via INTRAVENOUS
  Administered 2020-06-02: 200 mg via INTRAVENOUS
  Administered 2020-06-02: 70 mg via INTRAVENOUS
  Administered 2020-06-02: 50 mg via INTRAVENOUS

## 2020-06-02 MED ORDER — ENOXAPARIN SODIUM 40 MG/0.4ML ~~LOC~~ SOLN
40.0000 mg | SUBCUTANEOUS | Status: DC
  Administered 2020-06-03: 40 mg via SUBCUTANEOUS
  Filled 2020-06-02: qty 0.4

## 2020-06-02 MED ORDER — ORAL CARE MOUTH RINSE: 15.0000 mL | Freq: Once | OROMUCOSAL | Status: AC

## 2020-06-02 MED ORDER — CEFAZOLIN SODIUM-DEXTROSE 2-4 GM/100ML-% IV SOLN
2.0000 g | INTRAVENOUS | Status: AC
  Administered 2020-06-02: 2 g via INTRAVENOUS
  Filled 2020-06-02: qty 100

## 2020-06-02 MED ORDER — ACETAMINOPHEN 10 MG/ML IV SOLN
1000.0000 mg | Freq: Once | INTRAVENOUS | Status: DC | PRN
  Administered 2020-06-02: 1000 mg via INTRAVENOUS

## 2020-06-02 MED ORDER — DEXAMETHASONE SODIUM PHOSPHATE 4 MG/ML IJ SOLN
INTRAMUSCULAR | Status: DC | PRN
  Administered 2020-06-02: 5 mg via INTRAVENOUS

## 2020-06-02 MED ORDER — HYDROCHLOROTHIAZIDE 12.5 MG PO CAPS
12.5000 mg | ORAL_CAPSULE | Freq: Every day | ORAL | Status: DC | PRN
Start: 2020-06-02 — End: 2020-06-03

## 2020-06-02 MED ORDER — TRANEXAMIC ACID-NACL 1000-0.7 MG/100ML-% IV SOLN
1000.0000 mg | INTRAVENOUS | Status: AC
  Administered 2020-06-02: 1000 mg via INTRAVENOUS
  Filled 2020-06-02: qty 100

## 2020-06-02 MED ORDER — DEXMEDETOMIDINE (PRECEDEX) IN NS 20 MCG/5ML (4 MCG/ML) IV SYRINGE
PREFILLED_SYRINGE | INTRAVENOUS | Status: AC
  Filled 2020-06-02: qty 5

## 2020-06-02 MED ORDER — MIDAZOLAM HCL 5 MG/5ML IJ SOLN
INTRAMUSCULAR | Status: DC | PRN
  Administered 2020-06-02: 2 mg via INTRAVENOUS

## 2020-06-02 MED ORDER — ONDANSETRON HCL 4 MG/2ML IJ SOLN
INTRAMUSCULAR | Status: DC | PRN
  Administered 2020-06-02: 4 mg via INTRAVENOUS

## 2020-06-02 MED ORDER — ONDANSETRON HCL 4 MG PO TABS: 4.0000 mg | ORAL_TABLET | Freq: Four times a day (QID) | ORAL | Status: DC | PRN

## 2020-06-02 MED ORDER — LIDOCAINE 2% (20 MG/ML) 5 ML SYRINGE
INTRAMUSCULAR | Status: DC | PRN
  Administered 2020-06-02: 80 mg via INTRAVENOUS

## 2020-06-02 MED ORDER — ONDANSETRON HCL 4 MG/2ML IJ SOLN: 4.0000 mg | Freq: Once | INTRAMUSCULAR | Status: DC | PRN

## 2020-06-02 MED ORDER — FENTANYL CITRATE (PF) 100 MCG/2ML IJ SOLN
INTRAMUSCULAR | Status: DC | PRN
  Administered 2020-06-02 (×2): 100 ug via INTRAVENOUS
  Administered 2020-06-02: 50 ug via INTRAVENOUS

## 2020-06-02 MED ORDER — NALOXONE HCL 4 MG/0.1ML NA LIQD: 1.0000 | NASAL | Status: DC | PRN

## 2020-06-02 MED ORDER — ASPIRIN 81 MG PO CHEW
81.0000 mg | CHEWABLE_TABLET | Freq: Every day | ORAL | Status: DC
  Administered 2020-06-02 – 2020-06-03 (×2): 81 mg via ORAL
  Filled 2020-06-02 (×2): qty 1

## 2020-06-02 MED ORDER — VANCOMYCIN HCL 2000 MG/400ML IV SOLN
2000.0000 mg | Freq: Once | INTRAVENOUS | Status: AC
  Administered 2020-06-02: 2000 mg via INTRAVENOUS
  Filled 2020-06-02: qty 400

## 2020-06-02 MED ORDER — ISOPROPYL ALCOHOL 70 % SOLN
Status: DC | PRN
  Administered 2020-06-02: 1 via TOPICAL

## 2020-06-02 MED ORDER — OXYCODONE HCL 5 MG PO TABS
10.0000 mg | ORAL_TABLET | ORAL | Status: DC | PRN
  Administered 2020-06-02 – 2020-06-03 (×5): 15 mg via ORAL
  Filled 2020-06-02 (×5): qty 3

## 2020-06-02 MED ORDER — LACTATED RINGERS IV SOLN: INTRAVENOUS | Status: DC

## 2020-06-02 MED ORDER — ACETAMINOPHEN 500 MG PO TABS
1000.0000 mg | ORAL_TABLET | Freq: Four times a day (QID) | ORAL | Status: DC
  Administered 2020-06-02 – 2020-06-03 (×3): 1000 mg via ORAL
  Filled 2020-06-02 (×3): qty 2

## 2020-06-02 MED ORDER — LABETALOL HCL 5 MG/ML IV SOLN
INTRAVENOUS | Status: AC
  Filled 2020-06-02: qty 4

## 2020-06-02 MED ORDER — METHOCARBAMOL 500 MG PO TABS
500.0000 mg | ORAL_TABLET | Freq: Four times a day (QID) | ORAL | Status: DC | PRN
  Administered 2020-06-02: 500 mg via ORAL
  Filled 2020-06-02: qty 1

## 2020-06-02 MED ORDER — SODIUM CHLORIDE 0.9 % IR SOLN
Status: DC | PRN
  Administered 2020-06-02 (×2): 3000 mL

## 2020-06-02 MED ORDER — LIDOCAINE 2% (20 MG/ML) 5 ML SYRINGE
INTRAMUSCULAR | Status: AC
  Filled 2020-06-02: qty 5

## 2020-06-02 MED ORDER — METOCLOPRAMIDE HCL 5 MG PO TABS: 5.0000 mg | ORAL_TABLET | Freq: Three times a day (TID) | ORAL | Status: DC | PRN

## 2020-06-02 MED ORDER — ROCURONIUM BROMIDE 10 MG/ML (PF) SYRINGE
PREFILLED_SYRINGE | INTRAVENOUS | Status: DC | PRN
  Administered 2020-06-02: 60 mg via INTRAVENOUS

## 2020-06-02 MED ORDER — PROPOFOL 500 MG/50ML IV EMUL
INTRAVENOUS | Status: DC | PRN
  Administered 2020-06-02: 25 ug/kg/min via INTRAVENOUS

## 2020-06-02 MED ORDER — ACETAMINOPHEN 10 MG/ML IV SOLN
INTRAVENOUS | Status: AC
  Filled 2020-06-02: qty 100

## 2020-06-02 MED ORDER — POVIDONE-IODINE 10 % EX SWAB
2.0000 "application " | Freq: Once | CUTANEOUS | Status: AC
  Administered 2020-06-02: 2 via TOPICAL

## 2020-06-02 MED ORDER — DIPHENHYDRAMINE HCL 12.5 MG/5ML PO ELIX: 12.5000 mg | ORAL_SOLUTION | ORAL | Status: DC | PRN

## 2020-06-02 MED ORDER — SODIUM CHLORIDE 0.9 % IV SOLN: INTRAVENOUS | Status: DC

## 2020-06-02 MED ORDER — VANCOMYCIN HCL 1500 MG/300ML IV SOLN
1500.0000 mg | INTRAVENOUS | Status: DC
  Filled 2020-06-02: qty 300

## 2020-06-02 MED ORDER — DEXMEDETOMIDINE (PRECEDEX) IN NS 20 MCG/5ML (4 MCG/ML) IV SYRINGE
PREFILLED_SYRINGE | INTRAVENOUS | Status: DC | PRN
  Administered 2020-06-02: 8 ug via INTRAVENOUS
  Administered 2020-06-02: 4 ug via INTRAVENOUS
  Administered 2020-06-02: 8 ug via INTRAVENOUS

## 2020-06-02 MED ORDER — ALBUTEROL SULFATE HFA 108 (90 BASE) MCG/ACT IN AERS: 1.0000 | INHALATION_SPRAY | Freq: Four times a day (QID) | RESPIRATORY_TRACT | Status: DC | PRN

## 2020-06-02 MED ORDER — HYDROMORPHONE HCL 1 MG/ML IJ SOLN
INTRAMUSCULAR | Status: AC
  Administered 2020-06-02: 0.25 mg via INTRAVENOUS
  Filled 2020-06-02: qty 1

## 2020-06-02 MED ORDER — GABAPENTIN 300 MG PO CAPS
900.0000 mg | ORAL_CAPSULE | Freq: Every day | ORAL | Status: DC
  Administered 2020-06-02: 900 mg via ORAL
  Filled 2020-06-02: qty 3

## 2020-06-02 MED ORDER — ONDANSETRON HCL 4 MG/2ML IJ SOLN: 4.0000 mg | Freq: Four times a day (QID) | INTRAMUSCULAR | Status: DC | PRN

## 2020-06-02 MED ORDER — HYDROMORPHONE HCL 1 MG/ML IJ SOLN
INTRAMUSCULAR | Status: AC
  Administered 2020-06-02: 0.5 mg via INTRAVENOUS
  Filled 2020-06-02: qty 1

## 2020-06-02 MED ORDER — HYDROMORPHONE HCL 1 MG/ML IJ SOLN
0.5000 mg | INTRAMUSCULAR | Status: DC | PRN
Start: 2020-06-02 — End: 2020-06-03

## 2020-06-02 MED ORDER — CEFAZOLIN SODIUM-DEXTROSE 2-4 GM/100ML-% IV SOLN
2.0000 g | Freq: Three times a day (TID) | INTRAVENOUS | Status: DC
  Administered 2020-06-02 – 2020-06-03 (×2): 2 g via INTRAVENOUS
  Filled 2020-06-02 (×2): qty 100

## 2020-06-02 MED ORDER — GABAPENTIN 300 MG PO CAPS: 600.0000 mg | ORAL_CAPSULE | ORAL | Status: DC

## 2020-06-02 MED ORDER — ATORVASTATIN CALCIUM 20 MG PO TABS
20.0000 mg | ORAL_TABLET | Freq: Every day | ORAL | Status: DC
  Administered 2020-06-03: 20 mg via ORAL
  Filled 2020-06-02: qty 1

## 2020-06-02 MED ORDER — DOCUSATE SODIUM 100 MG PO CAPS
100.0000 mg | ORAL_CAPSULE | Freq: Two times a day (BID) | ORAL | Status: DC
  Administered 2020-06-02 – 2020-06-03 (×2): 100 mg via ORAL
  Filled 2020-06-02 (×2): qty 1

## 2020-06-02 MED ORDER — MIDAZOLAM HCL 2 MG/2ML IJ SOLN
INTRAMUSCULAR | Status: AC
  Filled 2020-06-02: qty 2

## 2020-06-02 MED ORDER — POVIDONE-IODINE 10 % EX SWAB: 2.0000 "application " | Freq: Once | CUTANEOUS | Status: DC

## 2020-06-02 MED ORDER — FENTANYL CITRATE (PF) 250 MCG/5ML IJ SOLN
INTRAMUSCULAR | Status: AC
  Filled 2020-06-02: qty 5

## 2020-06-02 MED ORDER — OXYCODONE HCL ER 20 MG PO T12A
20.0000 mg | EXTENDED_RELEASE_TABLET | Freq: Two times a day (BID) | ORAL | Status: DC
  Administered 2020-06-02 – 2020-06-03 (×2): 20 mg via ORAL
  Filled 2020-06-02 (×2): qty 1

## 2020-06-02 MED ORDER — ROCURONIUM BROMIDE 10 MG/ML (PF) SYRINGE
PREFILLED_SYRINGE | INTRAVENOUS | Status: AC
  Filled 2020-06-02: qty 10

## 2020-06-02 MED ORDER — SENNA 8.6 MG PO TABS
1.0000 | ORAL_TABLET | Freq: Two times a day (BID) | ORAL | Status: DC
  Administered 2020-06-02 – 2020-06-03 (×2): 8.6 mg via ORAL
  Filled 2020-06-02 (×2): qty 1

## 2020-06-02 MED ORDER — LISINOPRIL 20 MG PO TABS
20.0000 mg | ORAL_TABLET | Freq: Every day | ORAL | Status: DC
  Administered 2020-06-03: 20 mg via ORAL
  Filled 2020-06-02: qty 1

## 2020-06-02 MED ORDER — METOCLOPRAMIDE HCL 5 MG/ML IJ SOLN: 5.0000 mg | Freq: Three times a day (TID) | INTRAMUSCULAR | Status: DC | PRN

## 2020-06-02 MED ORDER — SUGAMMADEX SODIUM 200 MG/2ML IV SOLN
INTRAVENOUS | Status: DC | PRN
  Administered 2020-06-02: 200 mg via INTRAVENOUS

## 2020-06-02 MED ORDER — GABAPENTIN 300 MG PO CAPS
600.0000 mg | ORAL_CAPSULE | Freq: Two times a day (BID) | ORAL | Status: DC
  Administered 2020-06-03: 600 mg via ORAL
  Filled 2020-06-02: qty 2

## 2020-06-02 SURGICAL SUPPLY — 57 items
BAG ZIPLOCK 12X15 (MISCELLANEOUS) ×2 IMPLANT
CANISTER WOUNDNEG PRESSURE 500 (CANNISTER) ×2 IMPLANT
CHLORAPREP W/TINT 26 (MISCELLANEOUS) IMPLANT
COVER SURGICAL LIGHT HANDLE (MISCELLANEOUS) ×2 IMPLANT
DRAPE IMP U-DRAPE 54X76 (DRAPES) ×2 IMPLANT
DRAPE ORTHO SPLIT 77X108 STRL (DRAPES)
DRAPE SHEET LG 3/4 BI-LAMINATE (DRAPES) ×6 IMPLANT
DRAPE STERI IOBAN 125X83 (DRAPES) ×2 IMPLANT
DRAPE SURG ORHT 6 SPLT 77X108 (DRAPES) IMPLANT
DRAPE U-SHAPE 47X51 STRL (DRAPES) ×4 IMPLANT
DRESSING PEEL AND PLAC PRVNA20 (GAUZE/BANDAGES/DRESSINGS) IMPLANT
DRESSING PEEL AND PLC PRVNA 13 (GAUZE/BANDAGES/DRESSINGS) IMPLANT
DRESSING PREVENA PLUS CUSTOM (GAUZE/BANDAGES/DRESSINGS) ×1 IMPLANT
DRSG AQUACEL AG ADV 3.5X10 (GAUZE/BANDAGES/DRESSINGS) IMPLANT
DRSG PEEL AND PLACE PREVENA 13 (GAUZE/BANDAGES/DRESSINGS)
DRSG PEEL AND PLACE PREVENA 20 (GAUZE/BANDAGES/DRESSINGS)
DRSG PREVENA PLUS CUSTOM (GAUZE/BANDAGES/DRESSINGS) ×2
ELECT BLADE TIP CTD 4 INCH (ELECTRODE) ×2 IMPLANT
ELECT REM PT RETURN 15FT ADLT (MISCELLANEOUS) ×2 IMPLANT
EVACUATOR DRAINAGE 10X20 100CC (DRAIN) ×1 IMPLANT
EVACUATOR SILICONE 100CC (DRAIN) ×2
GLOVE SRG 8 PF TXTR STRL LF DI (GLOVE) ×1 IMPLANT
GLOVE SURG ENC MOIS LTX SZ8.5 (GLOVE) ×6 IMPLANT
GLOVE SURG ENC TEXT LTX SZ7.5 (GLOVE) ×4 IMPLANT
GLOVE SURG UNDER POLY LF SZ8 (GLOVE) ×2
GLOVE SURG UNDER POLY LF SZ8.5 (GLOVE) ×2 IMPLANT
GOWN SPEC L3 XXLG W/TWL (GOWN DISPOSABLE) ×2 IMPLANT
HANDPIECE INTERPULSE COAX TIP (DISPOSABLE) ×2
HOOD PEEL AWAY FLYTE STAYCOOL (MISCELLANEOUS) ×6 IMPLANT
IRRIGATION SURGIPHOR STRL (IV SOLUTION) IMPLANT
KIT DRSG PREVENA PLUS 7DAY 125 (MISCELLANEOUS) IMPLANT
KIT TURNOVER KIT A (KITS) ×2 IMPLANT
MANIFOLD NEPTUNE II (INSTRUMENTS) ×2 IMPLANT
MARKER SKIN DUAL TIP RULER LAB (MISCELLANEOUS) ×2 IMPLANT
NDL SAFETY ECLIPSE 18X1.5 (NEEDLE) IMPLANT
NEEDLE HYPO 18GX1.5 SHARP (NEEDLE)
NEEDLE SPNL 18GX3.5 QUINCKE PK (NEEDLE) IMPLANT
NS IRRIG 1000ML POUR BTL (IV SOLUTION) ×2 IMPLANT
PACK TOTAL JOINT (CUSTOM PROCEDURE TRAY) IMPLANT
PENCIL SMOKE EVACUATOR (MISCELLANEOUS) IMPLANT
SET HNDPC FAN SPRY TIP SCT (DISPOSABLE) ×1 IMPLANT
STAPLER VISISTAT 35W (STAPLE) IMPLANT
SUT ETHILON 2 0 PSLX (SUTURE) ×4 IMPLANT
SUT ETHILON 3 0 PS 1 (SUTURE) ×2 IMPLANT
SUT MNCRL AB 3-0 PS2 18 (SUTURE) IMPLANT
SUT MNCRL AB 4-0 PS2 18 (SUTURE) IMPLANT
SUT MON AB 2-0 CT1 36 (SUTURE) ×2 IMPLANT
SUT STRATAFIX PDO 1 14 VIOLET (SUTURE)
SUT STRATFX PDO 1 14 VIOLET (SUTURE)
SUT VIC AB 2-0 CT1 27 (SUTURE)
SUT VIC AB 2-0 CT1 TAPERPNT 27 (SUTURE) IMPLANT
SUTURE STRATFX PDO 1 14 VIOLET (SUTURE) IMPLANT
SWAB COLLECTION DEVICE MRSA (MISCELLANEOUS) ×2 IMPLANT
SWAB CULTURE ESWAB REG 1ML (MISCELLANEOUS) ×2 IMPLANT
SYR 3ML LL SCALE MARK (SYRINGE) IMPLANT
TRAY FOLEY MTR SLVR 16FR STAT (SET/KITS/TRAYS/PACK) IMPLANT
TRAY PREP A LATEX SAFE STRL (SET/KITS/TRAYS/PACK) ×2 IMPLANT

## 2020-06-02 NOTE — Anesthesia Procedure Notes (Signed)
Procedure Name: Intubation Date/Time: 06/02/2020 1:27 PM Performed by: Claudia Desanctis, CRNA Pre-anesthesia Checklist: Patient identified, Emergency Drugs available, Suction available and Patient being monitored Patient Re-evaluated:Patient Re-evaluated prior to induction Oxygen Delivery Method: Circle system utilized Preoxygenation: Pre-oxygenation with 100% oxygen Induction Type: IV induction Ventilation: Mask ventilation without difficulty Laryngoscope Size: 2 and Miller Grade View: Grade I Tube type: Oral Tube size: 7.0 mm Number of attempts: 1 Airway Equipment and Method: Stylet Placement Confirmation: ETT inserted through vocal cords under direct vision,  positive ETCO2 and breath sounds checked- equal and bilateral Secured at: 20 cm Tube secured with: Tape Dental Injury: Teeth and Oropharynx as per pre-operative assessment

## 2020-06-02 NOTE — Anesthesia Preprocedure Evaluation (Addendum)
Anesthesia Evaluation  Patient identified by MRN, date of birth, ID band Patient awake    Reviewed: Allergy & Precautions, NPO status , Patient's Chart, lab work & pertinent test results  History of Anesthesia Complications Negative for: history of anesthetic complications  Airway Mallampati: II  TM Distance: >3 FB Neck ROM: Full    Dental  (+) Partial Lower, Poor Dentition   Pulmonary neg pulmonary ROS, Patient abstained from smoking., former smoker,    Pulmonary exam normal        Cardiovascular hypertension, Pt. on medications  Rhythm:Regular Rate:Normal     Neuro/Psych  Headaches, Depression    GI/Hepatic GERD  ,(+)     Substance abuse: oxycodone ER 18 mg BID.  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 Bowel sounds: normal.  Peds  Hematology  (+) anemia ,   Anesthesia Other Findings   Reproductive/Obstetrics                         Anesthesia Physical  Anesthesia Plan  ASA: III  Anesthesia Plan: General   Post-op Pain Management:    Induction: Intravenous  PONV Risk Score and Plan: 3 and Treatment may vary due to age or medical condition, Ondansetron, Dexamethasone and Midazolam  Airway Management Planned: Mask and Oral ETT  Additional Equipment: None  Intra-op Plan:   Post-operative Plan: Extubation in OR  Informed Consent: I have reviewed the patients History and Physical, chart, labs and discussed the procedure including the risks, benefits and alternatives for the proposed anesthesia with the patient or authorized representative who has indicated his/her understanding and acceptance.     Dental advisory given  Plan Discussed with: CRNA  Anesthesia Plan Comments: (Lab  Results      Component                Value               Date                      WBC                      5.7                 06/01/2020                HGB                      11.3 (L)            06/01/2020                HCT                      38.0                06/01/2020                MCV                      97.9                06/01/2020  PLT                      208                 06/01/2020           Lab Results      Component                Value               Date                      NA                       140                 06/01/2020                K                        3.8                 06/01/2020                CO2                      25                  06/01/2020                GLUCOSE                  75                  06/01/2020                BUN                      15                  06/01/2020                CREATININE               0.81                06/01/2020                CALCIUM                  9.2                 06/01/2020                GFRNONAA                 >60                 06/01/2020                GFRAA                    >60                 02/26/2018          )        Anesthesia Quick Evaluation

## 2020-06-02 NOTE — Op Note (Signed)
OPERATIVE REPORT   06/02/2020  3:31 PM  PATIENT:  Misty [name]   SURGEON:  Bertram Savin, MD  ASSISTANT: Cherlynn June, PA-C.   PREOPERATIVE DIAGNOSIS: Surgical wound dehiscence left hip.  POSTOPERATIVE DIAGNOSIS:  Same.  PROCEDURE:  1.  Excisional debridement of skin and subcutaneous tissue, left hip. 2.  Application of negative pressure incisional dressing.  ANESTHESIA:   GETA.  ANTIBIOTICS: 2 g Ancef.  TUBES AND DRAINS: 1.  10 mm JP drain x1. 2.  Negative pressure incisional dressing.  IMPLANTS: None.  SPECIMENS: Superficial wound culture left hip for aerobic and anaerobic culture.  COMPLICATIONS: None.  DISPOSITION: Stable to PACU.  SURGICAL INDICATIONS:  Misty [name] is a 60 y.o. female who underwent primary left total hip arthroplasty on 04/28/2020.  Her hip has been feeling well and she has been functioning quite well.  She developed skin edge necrosis of the surgical incision and subsequent skin dehiscence. She was indicated for debridement and closure.  We discussed the risk, benefits, and alternatives, and she elected to proceed.  PROCEDURE IN DETAIL: The patient was identified in the preop holding area using 2 identifiers.  The surgical site was marked by myself.  She was taken to the operating room, and general anesthesia was induced on the stretcher.  She was then transferred to the [facility] table.  All bony prominences were well-padded.  The left hip was prepped and draped in the normal sterile surgical fashion.  Timeout was called, verifying site and site of surgery.  She did receive IV antibiotics within 60 minutes of beginning the procedure.  I began by examining the left hip wound.  At the superior aspect of the wound she had 4 cm open wound with full thickness skin dehiscence.  She had 2 similar appearing wounds distal to this area.  Necrotic adipose tissue was present within the wound bed.  Using a #10 blade, I sharply excised her previous  incision, taking care to ellipse out the dehisced areas.  In this step, I had excisionally debrided skin and subcutaneous tissue using a #10 blade.  The fatty tissue underneath was completely intact.  I obtained a wound culture swab from the wound bed.  The wound was copiously irrigated with irrisept solution followed by normal saline with pulse lavage.  I then performed a sharp debridement down to the level of the fascia underneath.  The fascial suture line was completely intact.  Therefore, I did not go deep to the fascia within the hip joint itself.  A 10 mm JP drain was brought out through a separate stab incision distally.  The drain was sewn into place with 3-0 nylon suture.  Deep dermal layer was closed with 2-0 Monocryl suture.  Skin was closed with 2-0 nylon using vertical mattress technique.  Customizable Prevena negative pressure dressing was placed according to manufacturer's instructions.  Suction was hooked up to 125 mmHg, and there was no leak.  The patient was then extubated and taken to the PACU in stable condition.  Sponge, needle, and instrument counts were correct at the end of the case x2.  There were no known complications.  POSTOPERATIVE PLAN: Postoperatively, the patient will be admitted to the hospital.  We will place her on IV Ancef and vancomycin for now.  We will await cultures.  When culture data is available, we will narrow her antibiotics.  We will plan to discontinue her JP drain once the drainage is less than 30 cc/day.  Continue negative pressure  dressing, and transition from house VAC suction unit to portable Prevena suction unit upon discharge.  She will follow-up 7 days after discharge for removal of her negative pressure dressing.

## 2020-06-02 NOTE — H&P (Signed)
PREOPERATIVE H&P  Chief Complaint: s/p left total hip arthroplasty with delayed wound healing and skin edge necrosis  HPI: Misty [name] is a 60 y.o. female who presents for preoperative history and physical with a diagnosis of s/p left total hip arthroplasty with delayed wound healing and skin edge necrosis.  Her original date of surgery was 04/28/2020.  I recommended operative debridement, possible head ball and liner exchange to protect the underlying implants.   Past Medical History:  Diagnosis Date  . Allergy   . Arthritis   . Back pain   . Colon cancer ([place])   . Depression   . Diarrhea   . GERD (gastroesophageal reflux disease)   . Hypertension   . Insomnia   . MS (multiple sclerosis) ([place]) 2000   pt says that she doesn't have it  . Other hammer toe (acquired) 03/31/2013  . Pneumonia    hx of  . Pre-diabetes   . Umbilical hernia    Past Surgical History:  Procedure Laterality Date  . ABDOMINAL HYSTERECTOMY    . ANTERIOR CERVICAL DECOMP/DISCECTOMY FUSION N/A 03/01/2014   Procedure: ANTERIOR CERVICAL DECOMPRESSION/DISCECTOMY FUSION 1 LEVEL;  Surgeon: Charlie Pitter, MD;  Location: [facility];  Service: Neurosurgery;  Laterality: N/A;  ANTERIOR CERVICAL DECOMPRESSION/DISCECTOMY FUSION 1 LEVEL  . BUNIONECTOMY Right 10/14/2014   @[facility]   . CESAREAN SECTION    . COLON SURGERY    . COLONOSCOPY    . FOOT SURGERY Right   . GASTRIC BYPASS    . Hammer Toe Repair Right 10/14/2014   RT #2, @[facility]   . TENOTOMY Right 10/14/2014   RT #3, @[facility]   . TOTAL HIP ARTHROPLASTY Left 04/28/2020   Procedure: TOTAL HIP ARTHROPLASTY ANTERIOR APPROACH;  Surgeon: Rod Can, MD;  Location: [facility];  Service: Orthopedics;  Laterality: Left;  3E bed   Social History   Socioeconomic History  . Marital status: Single    Spouse name: Not on file  . Number of children: 2  . Years of education: Not on file  . Highest education level: Not on file  Occupational History  . Occupation: Disability   Tobacco Use  . Smoking status: Former Smoker    Packs/day: 0.25    Years: 20.00    Pack years: 5.00    Types: Cigarettes    Quit date: 09/22/2018    Years since quitting: 1.6  . Smokeless tobacco: Never Used  . Tobacco comment: states she quit 3 months ago  Vaping Use  . Vaping Use: Never used  Substance and Sexual Activity  . Alcohol use: Yes    Alcohol/week: 0.0 standard drinks    Comment: social  . Drug use: No  . Sexual activity: Yes    Partners: Male  Other Topics Concern  . Not on file  Social History Narrative  . Not on file   Social Determinants of Health   Financial Resource Strain: Low Risk   . Difficulty of Paying Living Expenses: Not hard at all  Food Insecurity: Not on file  Transportation Needs: Not on file  Physical Activity: Not on file  Stress: Not on file  Social Connections: Not on file   Family History  Problem Relation Age of Onset  . Stroke Mother   . Hypertension Mother   . Hyperlipidemia Mother   . Diabetes Mother   . Diabetes Brother   . Hypertension Brother   . Hypertension Brother   . Hypertension Brother   . Hypertension Brother   . Hypertension  Brother   . Alcoholism Brother    Allergies  Allergen Reactions  . Phenergan [Promethazine Hcl] Anxiety  . Trazodone And Nefazodone Rash   Prior to Admission medications   Medication Sig Start Date End Date Taking? Authorizing Provider  albuterol (PROAIR HFA) 108 (90 Base) MCG/ACT inhaler Inhale 1-2 puffs into the lungs every 6 (six) hours as needed for wheezing or shortness of breath. 04/27/20  Yes Burns, Claudina Lick, MD  aspirin 81 MG chewable tablet Chew 1 tablet (81 mg total) by mouth 2 (two) times daily. Patient taking differently: Chew 81 mg by mouth daily. 04/29/20 06/13/20 Yes Caliber Landess, Aaron Edelman, MD  atorvastatin (LIPITOR) 20 MG tablet Take 1 tablet by mouth once daily Patient taking differently: Take 20 mg by mouth daily. 04/25/20  Yes Burns, Claudina Lick, MD  cyanocobalamin (,VITAMIN B-12,) 1000  MCG/ML injection Inject 1,000 mcg into the muscle every 30 (thirty) days. 04/16/19  Yes [provider]  diclofenac Sodium (VOLTAREN) 1 % GEL APPLY 2 GRAMS TOPICALLY 4 TIMES DAILY Patient taking differently: Apply 2 g topically 4 (four) times daily as needed (pain). 12/08/19  Yes Burns, Claudina Lick, MD  docusate sodium (COLACE) 100 MG capsule Take 1 capsule (100 mg total) by mouth 2 (two) times daily as needed for mild constipation. Patient taking differently: Take 200 mg by mouth daily. 06/12/19  Yes Burns, Claudina Lick, MD  gabapentin (NEURONTIN) 300 MG capsule TAKE 2 CAPSULES BY MOUTH IN THE MORNING AND 2 CAPSULES IN THE AFTERNOON AND 3 CAPSULES AT BEDTIME Patient taking differently: Take 600-900 mg by mouth See admin instructions. Take 600 mg by mouth in the morning, 600 mg in the afternoon and 900 mg in the afternoon. 05/17/20  Yes Burns, Claudina Lick, MD  hydrochlorothiazide (MICROZIDE) 12.5 MG capsule Take 12.5 mg by mouth daily as needed (swelling/high bp).   Yes [provider]  lisinopril (ZESTRIL) 20 MG tablet Take 1 tablet by mouth once daily Patient taking differently: Take 20 mg by mouth daily. 03/17/20  Yes Burns, Claudina Lick, MD  meloxicam (MOBIC) 15 MG tablet Take 1 tablet by mouth once daily with food Patient taking differently: Take 15 mg by mouth daily. 04/18/20  Yes Burns, Claudina Lick, MD  methocarbamol (ROBAXIN) 500 MG tablet Take 1 tablet (500 mg total) by mouth every 6 (six) hours as needed for muscle spasms. 04/30/20  Yes Dixon, Robb Matar, PA-C  ondansetron (ZOFRAN) 4 MG tablet TAKE 1 TABLET BY MOUTH EVERY 6 HOURS AS NEEDED FOR NAUSEA Patient taking differently: Take 4 mg by mouth every 6 (six) hours as needed for nausea. 04/18/20  Yes Burns, Claudina Lick, MD  Oxycodone HCl 10 MG TABS Take 10 mg by mouth 4 (four) times daily as needed for pain. 05/16/20  Yes [provider]  tizanidine (ZANAFLEX) 2 MG capsule Take 1 capsule (2 mg total) by mouth at bedtime as needed for muscle  spasms. 04/01/20  Yes Burns, Claudina Lick, MD  XTAMPZA ER 18 MG C12A Take 18 mg by mouth every 12 (twelve) hours. 03/10/20  Yes [provider]  HYDROmorphone (DILAUDID) 4 MG tablet Take 0.5 tablets (2 mg total) by mouth every 4 (four) hours as needed for severe pain. Patient not taking: No sig reported 04/29/20   Rod Can, MD  naloxone [facility]) nasal spray 4 mg/0.1 mL Place 1 spray into the nose as needed (opioid overdose).    [provider]  senna (SENOKOT) 8.6 MG TABS tablet Take 2 tablets (17.2 mg total) by  mouth at bedtime. Patient not taking: No sig reported 04/29/20   Rod Can, MD  Syringe/Needle, Disp, (SYRINGE 3CC/25GX1") 25G X 1" 3 ML MISC Use for monthly B12 injections 07/25/18   Burns, Claudina Lick, MD     Positive ROS: All other systems have been reviewed and were otherwise negative with the exception of those mentioned in the HPI and as above.  Physical Exam: General: Alert, no acute distress Cardiovascular: No pedal edema Respiratory: No cyanosis, no use of accessory musculature GI: No organomegaly, abdomen is soft and non-tender Skin: No lesions in the area of chief complaint Neurologic: Sensation intact distally Psychiatric: Patient is competent for consent with normal mood and affect Lymphatic: No axillary or cervical lymphadenopathy  MUSCULOSKELETAL: Examination of the left hip reveals 3 specific areas of skin edge and underlying fatty tissue necrosis with dehiscence.  Painless logrolling of the hip.  Assessment: s/p left total hip arthroplasty with delayed wound healing and skin edge necrosis  Plan: Plan for Procedure(s): IRRIGATION AND DEBRIDEMENT LEFT HIP, POSSIBLE HEAD BALL AND LINER EXCHANGE  The risks benefits and alternatives were discussed with the patient including but not limited to the risks of nonoperative treatment, versus surgical intervention including infection, bleeding, nerve injury, blood clots, cardiopulmonary complications,  morbidity, mortality, among others, and they were willing to proceed.   Bertram Savin, MD [phone]   06/02/2020 1:05 PM

## 2020-06-02 NOTE — Progress Notes (Signed)
Pharmacy Antibiotic Note  Misty [name] is a 60 y.o. female presented to [facility] on 06/02/2020 with hip wound infection s/p left THA on 04/28/20. She underwent I&D on 3/17.  Pharmacy has been consulted to dose vancomycin for infection.  Plan: - vancomycin 2000 mg IV x1, then 1500mg  q24h for est AUC 470, Vd 0.5, scr 0.81 - ancef 2gm q8h per MD  _____________________________________________ Height: 5\' 4"  (162.6 cm) Weight: 110.2 kg (243 lb) IBW/kg (Calculated) : 54.7  Temp (24hrs), Avg:98.4 F (36.9 C), Min:98 F (36.7 C), Max:98.7 F (37.1 C)  Recent Labs  Lab 06/01/20 0832  WBC 5.7  CREATININE 0.81    Estimated Creatinine Clearance: 90.8 mL/min (by C-G formula based on SCr of 0.81 mg/dL).    Allergies  Allergen Reactions  . Phenergan [Promethazine Hcl] Anxiety  . Trazodone And Nefazodone Rash    Thank you for allowing pharmacy to be a part of this patient's care.  Lynelle Doctor 06/02/2020 5:32 PM

## 2020-06-02 NOTE — Transfer of Care (Signed)
Immediate Anesthesia Transfer of Care Note  Patient: Misty [name]  Procedure(s) Performed: IRRIGATION AND DEBRIDEMENT LEFT HIP, POSSIBLE HEAD BALL AND LINER EXCHANGE (Left Hip)  Patient Location: PACU  Anesthesia Type:General  Level of Consciousness: awake  Airway & Oxygen Therapy: Patient Spontanous Breathing and Patient connected to face mask oxygen  Post-op Assessment: Report given to RN and Post -op Vital signs reviewed and stable  Post vital signs: Reviewed and stable  Last Vitals:  Vitals Value Taken Time  BP 114/77 06/02/20 1530  Temp    Pulse 85 06/02/20 1532  Resp 26 06/02/20 1532  SpO2 98 % 06/02/20 1532  Vitals shown include unvalidated device data.  Last Pain:  Vitals:   06/02/20 1135  PainSc: 4       Patients Stated Pain Goal: 3 (01/18/10 1735)  Complications: No complications documented.

## 2020-06-03 ENCOUNTER — Encounter (HOSPITAL_COMMUNITY): Payer: Self-pay | Admitting: Orthopedic Surgery

## 2020-06-03 DIAGNOSIS — Z85038 Personal history of other malignant neoplasm of large intestine: Secondary | ICD-10-CM | POA: Diagnosis not present

## 2020-06-03 DIAGNOSIS — Z7982 Long term (current) use of aspirin: Secondary | ICD-10-CM | POA: Diagnosis not present

## 2020-06-03 DIAGNOSIS — Z79899 Other long term (current) drug therapy: Secondary | ICD-10-CM | POA: Diagnosis not present

## 2020-06-03 DIAGNOSIS — T8131XA Disruption of external operation (surgical) wound, not elsewhere classified, initial encounter: Secondary | ICD-10-CM | POA: Diagnosis not present

## 2020-06-03 DIAGNOSIS — R7303 Prediabetes: Secondary | ICD-10-CM | POA: Diagnosis not present

## 2020-06-03 DIAGNOSIS — I10 Essential (primary) hypertension: Secondary | ICD-10-CM | POA: Diagnosis not present

## 2020-06-03 DIAGNOSIS — Z87891 Personal history of nicotine dependence: Secondary | ICD-10-CM | POA: Diagnosis not present

## 2020-06-03 LAB — CBC
HCT: 30.3 % — ABNORMAL LOW (ref 36.0–46.0)
Hemoglobin: 9.1 g/dL — ABNORMAL LOW (ref 12.0–15.0)
MCH: 29.6 pg (ref 26.0–34.0)
MCHC: 30 g/dL (ref 30.0–36.0)
MCV: 98.7 fL (ref 80.0–100.0)
Platelets: 172 10*3/uL (ref 150–400)
RBC: 3.07 MIL/uL — ABNORMAL LOW (ref 3.87–5.11)
RDW: 13.5 % (ref 11.5–15.5)
WBC: 5.7 10*3/uL (ref 4.0–10.5)
nRBC: 0 % (ref 0.0–0.2)

## 2020-06-03 LAB — BASIC METABOLIC PANEL
Anion gap: 9 (ref 5–15)
BUN: 21 mg/dL — ABNORMAL HIGH (ref 6–20)
CO2: 22 mmol/L (ref 22–32)
Calcium: 8.7 mg/dL — ABNORMAL LOW (ref 8.9–10.3)
Chloride: 106 mmol/L (ref 98–111)
Creatinine, Ser: 0.93 mg/dL (ref 0.44–1.00)
GFR, Estimated: >60 mL/min (ref >60)
Glucose, Bld: 168 mg/dL — ABNORMAL HIGH (ref 70–99)
Potassium: 4.6 mmol/L (ref 3.5–5.1)
Sodium: 137 mmol/L (ref 135–145)

## 2020-06-03 MED ORDER — DOXYCYCLINE HYCLATE 100 MG PO CAPS: 100.0000 mg | ORAL_CAPSULE | Freq: Two times a day (BID) | ORAL | Status: AC

## 2020-06-03 NOTE — Evaluation (Signed)
Physical Therapy Evaluation Patient Details Name: Misty [name] MRN: [id number] DOB: 1960/08/07 Today's Date: 06/03/2020   History of Present Illness  Pt s/p L hip excisional debridement of skin and subcutaneous tissue with application of negative pressure incisional dressing.  Pt s/p L THR 2/22 and with hx of colon CA and anterior cervical fusion  Clinical Impression  Pt admitted as above and presenting with functional mobility limitations 2* post op pain and decreased L LE strength/ROM.  Pt currently mobilizing at sup to MOD I level with RW and eager for dc home.    Follow Up Recommendations Follow surgeon's recommendation for DC plan and follow-up therapies    Equipment Recommendations  None recommended by PT    Recommendations for Other Services       Precautions / Restrictions Precautions Precautions: Fall Restrictions Weight Bearing Restrictions: No Other Position/Activity Restrictions: WBAT      Mobility  Bed Mobility               General bed mobility comments: Pt up in chair and requests back to same.  Pt states min difficulty exiting bed this am    Transfers Overall transfer level: Modified independent Equipment used: Rolling walker (2 wheeled) Transfers: Sit to/from Stand           General transfer comment: pt self cueing for use of UEs to self assist  Ambulation/Gait Ambulation/Gait assistance: Supervision;Modified independent (Device/Increase time) Gait Distance (Feet): 450 Feet Assistive device: Rolling walker (2 wheeled) Gait Pattern/deviations: Step-through pattern;Shuffle;Antalgic Gait velocity: mod pace   General Gait Details: Min cues for posture and position from [employer] Mobility    Modified Rankin (Stroke Patients Only)       Balance Overall balance assessment: Mild deficits observed, not formally tested Sitting-balance support: Feet supported Sitting balance-Leahy Scale: Good     Standing  balance support: During functional activity Standing balance-Leahy Scale: Fair                               Pertinent Vitals/Pain Pain Assessment: Faces Faces Pain Scale: Hurts little more Pain Location: L hip incision site Pain Descriptors / Indicators: Sore Pain Intervention(s): Limited activity within patient's tolerance;Monitored during session;Premedicated before session;Ice applied    Home Living Family/patient expects to be discharged to:: Private residence Living Arrangements: Alone Available Help at Discharge: Family;Available PRN/intermittently Type of Home: House Home Access: Stairs to enter;Level entry     Home Layout: One level Home Equipment: Walker - 2 wheels;Walker - 4 wheels;Shower seat      Prior Function Level of Independence: Independent;Independent with assistive device(s)         Comments: Pt had progressed to ambulation sans AD at home following THR but returned to use of RW with onset of wound dehis     Hand Dominance   Dominant Hand: Right    Extremity/Trunk Assessment   Upper Extremity Assessment Upper Extremity Assessment: Overall WFL for tasks assessed    Lower Extremity Assessment Lower Extremity Assessment: LLE deficits/detail LLE Deficits / Details: AROM/strength limited by discomfort at wound site.  Pt states she had not built up to being able to SLR on L since THR    Cervical / Trunk Assessment Cervical / Trunk Assessment: Normal  Communication   Communication: No difficulties  Cognition Arousal/Alertness: Awake/alert Behavior During Therapy: WFL for tasks assessed/performed Overall Cognitive Status: Within  Functional Limits for tasks assessed                                        General Comments      Exercises     Assessment/Plan    PT Assessment Patient needs continued PT services  PT Problem List Decreased strength;Decreased range of motion;Decreased activity tolerance;Decreased  balance;Decreased mobility;Decreased knowledge of use of DME;Decreased knowledge of precautions       PT Treatment Interventions DME instruction;Gait training;Stair training;Functional mobility training;Therapeutic activities;Therapeutic exercise;Balance training;Patient/family education    PT Goals (Current goals can be found in the Care Plan section)  Acute Rehab PT Goals Patient Stated Goal: get back to independence PT Goal Formulation: All assessment and education complete, DC therapy    Frequency 7X/week   Barriers to discharge        Co-evaluation               AM-PAC PT "6 Clicks" Mobility  Outcome Measure Help needed turning from your back to your side while in a flat bed without using bedrails?: None Help needed moving from lying on your back to sitting on the side of a flat bed without using bedrails?: None Help needed moving to and from a bed to a chair (including a wheelchair)?: None Help needed standing up from a chair using your arms (e.g., wheelchair or bedside chair)?: None Help needed to walk in hospital room?: None Help needed climbing 3-5 steps with a railing? : A Little 6 Click Score: 23    End of Session Equipment Utilized During Treatment: Gait belt Activity Tolerance: Patient tolerated treatment well Patient left: in chair;with call bell/phone within reach Nurse Communication: Mobility status PT Visit Diagnosis: Muscle weakness (generalized) (M62.81);Difficulty in walking, not elsewhere classified (R26.2)    Time: 6283-1517 PT Time Calculation (min) (ACUTE ONLY): 18 min   Charges:   PT Evaluation $PT Eval Low Complexity: 1 Low          [place] Pager [phone] Office [phone]   Elber Galyean 06/03/2020, 12:36 PM

## 2020-06-03 NOTE — Care Management Obs Status (Signed)
[place] NOTIFICATION   Patient Details  Name: Misty [name] MRN: [id number] Date of Birth: Jul 20, 1960   Medicare Observation Status Notification Given:  Yes    Sherie Don, LCSW 06/03/2020, 12:19 PM

## 2020-06-03 NOTE — Care Management CC44 (Signed)
Condition Code 44 Documentation Completed  Patient Details  Name: Misty [name] MRN: [id number] Date of Birth: 20-Dec-1960   Condition Code 44 given:  Yes Patient signature on Condition Code 44 notice:  Yes Documentation of 2 MD's agreement:  Yes Code 44 added to claim:  Yes    Sherie Don, LCSW 06/03/2020, 12:19 PM

## 2020-06-03 NOTE — Progress Notes (Signed)
Patient verbalized understanding of dc instructions, as well as instructions r/t JP drain and wound vac. Patient switched over to portable wound vac and given instruction packet/charger.

## 2020-06-03 NOTE — Discharge Instructions (Signed)
Call [phone] ASAP to schedule a follow up appointment on Friday 06/10/2020 Keep VAC dressing clean and dry. Do not remove. Charge VAC unit nightly.  Drain care: every 8 hours empty JP drain, record drainage in a journal (bring to your follow up appointment), and recharge.

## 2020-06-03 NOTE — Progress Notes (Signed)
Subjective:  Patient reports pain as mild to moderate.  Denies N/V/CP/SOB. C/o burning at incision  Objective:   VITALS:   Vitals:   06/02/20 2043 06/03/20 0140 06/03/20 0456 06/03/20 0956  BP: 136/69 140/72 140/88 128/73  Pulse: 89 86 64 87  Resp: 16 16 17 17   Temp: 97.8 F (36.6 C) 97.6 F (36.4 C) (!) 97.5 F (36.4 C) 97.9 F (36.6 C)  TempSrc: Oral Oral Oral Oral  SpO2: 100% 100% 100% 100%  Weight:      Height:      JP 60 cc o/n  NAD ABD soft Sensation intact distally Intact pulses distally Dorsiflexion/Plantar flexion intact Incision: dressing C/D/I Compartment soft JP ss iVAC intact without leak  Lab Results  Component Value Date   WBC 5.7 06/03/2020   HGB 9.1 (L) 06/03/2020   HCT 30.3 (L) 06/03/2020   MCV 98.7 06/03/2020   PLT 172 06/03/2020   BMET    Component Value Date/Time   NA 137 06/03/2020 0318   K 4.6 06/03/2020 0318   CL 106 06/03/2020 0318   CO2 22 06/03/2020 0318   GLUCOSE 168 (H) 06/03/2020 0318   BUN 21 (H) 06/03/2020 0318   CREATININE 0.93 06/03/2020 0318   CREATININE 0.51 08/21/2013 1337   CALCIUM 8.7 (L) 06/03/2020 0318   GFRNONAA >60 06/03/2020 0318   GFRNONAA >89 08/03/2013 1127   GFRAA >60 02/26/2018 0525   GFRAA >89 08/03/2013 1127     Recent Results (from the past 240 hour(s))  Surgical pcr screen     Status: None   Collection Time: 06/01/20  8:32 AM   Specimen: Nasal Mucosa; Nasal Swab  Result Value Ref Range Status   MRSA, PCR NEGATIVE NEGATIVE Final   Staphylococcus aureus NEGATIVE NEGATIVE Final    Comment: (NOTE) The Xpert SA Assay (FDA approved for NASAL specimens in patients 8 years of age and older), is one component of a comprehensive surveillance program. It is not intended to diagnose infection nor to guide or monitor treatment. Performed at [facility], [place] [address], [place], [place] [address]   SARS CORONAVIRUS 2 (TAT 6-24 HRS) Nasopharyngeal Nasopharyngeal Swab     Status:  None   Collection Time: 06/01/20  9:02 AM   Specimen: Nasopharyngeal Swab  Result Value Ref Range Status   SARS Coronavirus 2 NEGATIVE NEGATIVE Final    Comment: (NOTE) SARS-CoV-2 target nucleic acids are NOT DETECTED.  The SARS-CoV-2 RNA is generally detectable in upper and lower respiratory specimens during the acute phase of infection. Negative results do not preclude SARS-CoV-2 infection, do not rule out co-infections with other pathogens, and should not be used as the sole basis for treatment or other patient management decisions. Negative results must be combined with clinical observations, patient history, and epidemiological information. The expected result is Negative.  Fact Sheet for Patients: SugarRoll.be  Fact Sheet for Healthcare Providers: https://www.woods-mathews.com/  This test is not yet approved or cleared by the [place] FDA and  has been authorized for detection and/or diagnosis of SARS-CoV-2 by FDA under an Emergency Use Authorization (EUA). This EUA will remain  in effect (meaning this test can be used) for the duration of the COVID-19 declaration under Se ction 564(b)(1) of the Act, 21 U.S.C. section 360bbb-3(b)(1), unless the authorization is terminated or revoked sooner.  Performed at [facility], [place] [address], [place], [place] [address]   Aerobic/Anaerobic Culture w Gram Stain (surgical/deep wound)     Status:  None (Preliminary result)   Collection Time: 06/02/20  1:59 PM   Specimen: Wound  Result Value Ref Range Status   Specimen Description   Final    WOUND  LEFT HIP Performed at [place] [address], [place], [place] [address]    Special Requests   Final    NONE Performed at [facility], [place] [address] [place], [place] [address]    Gram Stain NO WBC SEEN NO ORGANISMS SEEN   Final   Culture   Final    NO GROWTH < 24 HOURS Performed at  [facility], [place] [address] [place], [place] [address]    Report Status PENDING  Incomplete     Assessment/Plan: 1 Day Post-Op   Active Problems:   Surgical wound dehiscence   WBAT with walker DVT ppx: Lovenox, SCDs, TEDS PO pain control PT/OT intraop cx pending, gram stain (-) Dispo: cultures will take 5 days to finalize --> d/c home on PO abx, convert VAC to portable prevena, d/c home with JP drain   Bertram Savin 06/03/2020, 11:47 AM   Rod Can, MD [phone] [place] [facility] now [facility]  [place] [address], [place], [place] [address] Phone: [phone] [email or web address] Facebook  [employer]

## 2020-06-03 NOTE — Plan of Care (Signed)
Plan of care reviewed and discussed with the patient. 

## 2020-06-03 NOTE — Plan of Care (Signed)
Patient dc'd all care plans met  

## 2020-06-03 NOTE — Discharge Summary (Signed)
Physician Discharge Summary  Patient ID: Misty [name] MRN: [id number] DOB/AGE: [birth year] 59 y.o.  Admit date: 06/02/2020 Discharge date: 06/03/2020  Admission Diagnoses:  Surgical wound dehiscence  Discharge Diagnoses:  Principal Problem:   Surgical wound dehiscence   Past Medical History:  Diagnosis Date  . Allergy   . Arthritis   . Back pain   . Colon cancer ([place])   . Depression   . Diarrhea   . GERD (gastroesophageal reflux disease)   . Hypertension   . Insomnia   . MS (multiple sclerosis) ([place]) 2000   pt says that she doesn't have it  . Other hammer toe (acquired) 03/31/2013  . Pneumonia    hx of  . Pre-diabetes   . Umbilical hernia     Surgeries: Procedure(s): IRRIGATION AND DEBRIDEMENT LEFT HIP on 06/02/2020   Consultants (if any):   Discharged Condition: Improved  Hospital Course: Misty [name] is an 60 y.o. female who was admitted 06/02/2020 with a diagnosis of Surgical wound dehiscence and went to the operating room on 06/02/2020 and underwent the above named procedures.    She was given perioperative antibiotics:  Anti-infectives (From admission, onward)   Start     Dose/Rate Route Frequency Ordered Stop   06/03/20 1800  vancomycin (VANCOREADY) IVPB 1500 mg/300 mL        1,500 mg 150 mL/hr over 120 Minutes Intravenous Every 24 hours 06/02/20 1740     06/03/20 0000  doxycycline (VIBRAMYCIN) 100 MG capsule        100 mg Oral 2 times daily 06/03/20 1154 06/17/20 2359   06/02/20 2200  ceFAZolin (ANCEF) IVPB 2g/100 mL premix        2 g 200 mL/hr over 30 Minutes Intravenous Every 8 hours 06/02/20 1704     06/02/20 1830  vancomycin (VANCOREADY) IVPB 2000 mg/400 mL        2,000 mg 200 mL/hr over 120 Minutes Intravenous  Once 06/02/20 1740 06/02/20 2058   06/02/20 1115  ceFAZolin (ANCEF) IVPB 2g/100 mL premix        2 g 200 mL/hr over 30 Minutes Intravenous On call to O.R. 06/02/20 1103 06/02/20 1327    .  She was given sequential compression  devices, early ambulation, and Lovenox for DVT prophylaxis.  She benefited maximally from the hospital stay and there were no complications.    She was discharge with portable incisional VAC dressing and JP drain.  Recent vital signs:  Vitals:   06/03/20 0456 06/03/20 0956  BP: 140/88 128/73  Pulse: 64 87  Resp: 17 17  Temp: (!) 97.5 F (36.4 C) 97.9 F (36.6 C)  SpO2: 100% 100%    Recent laboratory studies:  Lab Results  Component Value Date   HGB 9.1 (L) 06/03/2020   HGB 11.3 (L) 06/01/2020   HGB 8.9 (L) 04/30/2020   Lab Results  Component Value Date   WBC 5.7 06/03/2020   PLT 172 06/03/2020   Lab Results  Component Value Date   INR 1.1 06/01/2020   Lab Results  Component Value Date   NA 137 06/03/2020   K 4.6 06/03/2020   CL 106 06/03/2020   CO2 22 06/03/2020   BUN 21 (H) 06/03/2020   CREATININE 0.93 06/03/2020   GLUCOSE 168 (H) 06/03/2020    Discharge Medications:   Allergies as of 06/03/2020      Reactions   Phenergan [promethazine Hcl] Anxiety   Trazodone And Nefazodone Rash      Medication List  TAKE these medications   albuterol 108 (90 Base) MCG/ACT inhaler Commonly known as: ProAir HFA Inhale 1-2 puffs into the lungs every 6 (six) hours as needed for wheezing or shortness of breath.   aspirin 81 MG chewable tablet Chew 1 tablet (81 mg total) by mouth 2 (two) times daily. What changed: when to take this   atorvastatin 20 MG tablet Commonly known as: LIPITOR Take 1 tablet by mouth once daily   cyanocobalamin 1000 MCG/ML injection Commonly known as: (VITAMIN B-12) Inject 1,000 mcg into the muscle every 30 (thirty) days.   diclofenac Sodium 1 % Gel Commonly known as: VOLTAREN APPLY 2 GRAMS TOPICALLY 4 TIMES DAILY What changed: See the new instructions.   docusate sodium 100 MG capsule Commonly known as: Colace Take 1 capsule (100 mg total) by mouth 2 (two) times daily as needed for mild constipation. What changed:   how much to  take  when to take this   doxycycline 100 MG capsule Commonly known as: VIBRAMYCIN Take 1 capsule (100 mg total) by mouth 2 (two) times daily for 14 days.   gabapentin 300 MG capsule Commonly known as: NEURONTIN TAKE 2 CAPSULES BY MOUTH IN THE MORNING AND 2 CAPSULES IN THE AFTERNOON AND 3 CAPSULES AT BEDTIME What changed:   how much to take  how to take this  when to take this  additional instructions   hydrochlorothiazide 12.5 MG capsule Commonly known as: MICROZIDE Take 12.5 mg by mouth daily as needed (swelling/high bp).   HYDROmorphone 4 MG tablet Commonly known as: DILAUDID Take 0.5 tablets (2 mg total) by mouth every 4 (four) hours as needed for severe pain.   lisinopril 20 MG tablet Commonly known as: ZESTRIL Take 1 tablet by mouth once daily   meloxicam 15 MG tablet Commonly known as: MOBIC Take 1 tablet by mouth once daily with food What changed: additional instructions   methocarbamol 500 MG tablet Commonly known as: ROBAXIN Take 1 tablet (500 mg total) by mouth every 6 (six) hours as needed for muscle spasms.   naloxone 4 MG/0.1ML Liqd nasal spray kit Commonly known as: NARCAN Place 1 spray into the nose as needed (opioid overdose).   ondansetron 4 MG tablet Commonly known as: ZOFRAN TAKE 1 TABLET BY MOUTH EVERY 6 HOURS AS NEEDED FOR NAUSEA What changed:   reasons to take this  additional instructions   Oxycodone HCl 10 MG Tabs Take 10 mg by mouth 4 (four) times daily as needed for pain.   senna 8.6 MG Tabs tablet Commonly known as: SENOKOT Take 2 tablets (17.2 mg total) by mouth at bedtime.   SYRINGE 3CC/25GX1" 25G X 1" 3 ML Misc Use for monthly B12 injections   tizanidine 2 MG capsule Commonly known as: ZANAFLEX Take 1 capsule (2 mg total) by mouth at bedtime as needed for muscle spasms.   Xtampza ER 18 MG C12a Generic drug: oxyCODONE ER Take 18 mg by mouth every 12 (twelve) hours.       Diagnostic Studies: VAS [name] LOWER  EXTREMITY VENOUS (DVT)  Result Date: 05/13/2020  Lower Venous DVT Study Indications: Left lower extremity pain and swelling. Patient denies SOB.  Risk Factors: Surgery to the left hip. Left total hip arthroplasty on 04/28/2020. Comparison Study: NA Performing Technologist: Sharlett Iles RVT  Examination Guidelines: A complete evaluation includes B-mode imaging, spectral Doppler, color Doppler, and power Doppler as needed of all accessible portions of each vessel. Bilateral testing is considered an integral part of a complete  examination. Limited examinations for reoccurring indications may be performed as noted. The reflux portion of the exam is performed with the patient in reverse Trendelenburg.  +-----+---------------+---------+-----------+----------+--------------+ RIGHTCompressibilityPhasicitySpontaneityPropertiesThrombus Aging +-----+---------------+---------+-----------+----------+--------------+ CFV  Full           Yes      Yes                                 +-----+---------------+---------+-----------+----------+--------------+   +---------+---------------+---------+-----------+----------+--------------+ LEFT     CompressibilityPhasicitySpontaneityPropertiesThrombus Aging +---------+---------------+---------+-----------+----------+--------------+ CFV      Full           Yes      Yes                                 +---------+---------------+---------+-----------+----------+--------------+ SFJ      Full           Yes      Yes                                 +---------+---------------+---------+-----------+----------+--------------+ FV Prox  Full           Yes      Yes                                 +---------+---------------+---------+-----------+----------+--------------+ FV Mid   Full                                                        +---------+---------------+---------+-----------+----------+--------------+ FV DistalFull           Yes      Yes                                  +---------+---------------+---------+-----------+----------+--------------+ PFV      Full           Yes      Yes                                 +---------+---------------+---------+-----------+----------+--------------+ POP      Full           Yes      Yes                                 +---------+---------------+---------+-----------+----------+--------------+ PTV      Full           Yes      Yes                                 +---------+---------------+---------+-----------+----------+--------------+ PERO     Full                    No                                  +---------+---------------+---------+-----------+----------+--------------+  Gastroc  Full                                                        +---------+---------------+---------+-----------+----------+--------------+ GSV      Full           Yes      Yes                                 +---------+---------------+---------+-----------+----------+--------------+   Left Technical Findings: Limited visualization of the peroneal veins due to increase lower leg swelling. Specifically, the tibioperoneal confluence and popliteal vein are without thrombus.   Summary: RIGHT: - No evidence of common femoral vein obstruction.  LEFT: - No evidence of deep vein thrombosis in the lower extremity. No indirect evidence of obstruction proximal to the inguinal ligament. - No cystic structure found in the popliteal fossa.  *See table(s) above for measurements and observations. Electronically signed by Larae Grooms MD on 05/13/2020 at 1:00:54 PM.    Final    Recent Results (from the past 240 hour(s))  Surgical pcr screen     Status: None   Collection Time: 06/01/20  8:32 AM   Specimen: Nasal Mucosa; Nasal Swab  Result Value Ref Range Status   MRSA, PCR NEGATIVE NEGATIVE Final   Staphylococcus aureus NEGATIVE NEGATIVE Final    Comment: (NOTE) The Xpert SA Assay (FDA approved  for NASAL specimens in patients 67 years of age and older), is one component of a comprehensive surveillance program. It is not intended to diagnose infection nor to guide or monitor treatment. Performed at [facility], [place] [address], [place], [place] [address]   SARS CORONAVIRUS 2 (TAT 6-24 HRS) Nasopharyngeal Nasopharyngeal Swab     Status: None   Collection Time: 06/01/20  9:02 AM   Specimen: Nasopharyngeal Swab  Result Value Ref Range Status   SARS Coronavirus 2 NEGATIVE NEGATIVE Final    Comment: (NOTE) SARS-CoV-2 target nucleic acids are NOT DETECTED.  The SARS-CoV-2 RNA is generally detectable in upper and lower respiratory specimens during the acute phase of infection. Negative results do not preclude SARS-CoV-2 infection, do not rule out co-infections with other pathogens, and should not be used as the sole basis for treatment or other patient management decisions. Negative results must be combined with clinical observations, patient history, and epidemiological information. The expected result is Negative.  Fact Sheet for Patients: SugarRoll.be  Fact Sheet for Healthcare Providers: https://www.woods-mathews.com/  This test is not yet approved or cleared by the [place] FDA and  has been authorized for detection and/or diagnosis of SARS-CoV-2 by FDA under an Emergency Use Authorization (EUA). This EUA will remain  in effect (meaning this test can be used) for the duration of the COVID-19 declaration under Se ction 564(b)(1) of the Act, 21 U.S.C. section 360bbb-3(b)(1), unless the authorization is terminated or revoked sooner.  Performed at [facility], [place] [address], [place], [place] [address]   Aerobic/Anaerobic Culture w Gram Stain (surgical/deep wound)     Status: None (Preliminary result)   Collection Time: 06/02/20  1:59 PM   Specimen: Wound  Result Value Ref Range Status   Specimen  Description   Final    WOUND  LEFT HIP Performed at [facility], [place] [address], [place], [place] [address]    Special Requests   Final    NONE Performed at [facility], [place] [address] [place], [place] [address]    Gram Stain NO WBC SEEN NO ORGANISMS SEEN   Final   Culture   Final    NO GROWTH < 24 HOURS Performed at [facility], [place] [address], [place], [place] [address]    Report Status PENDING  Incomplete      Disposition: Discharge disposition: 01-Home or Self Care       Discharge Instructions    Call MD / Call 911   Complete by: As directed    If you experience chest pain or shortness of breath, CALL 911 and be transported to the hospital emergency room.  If you develope a fever above 101 F, pus (white drainage) or increased drainage or redness at the wound, or calf pain, call your surgeon's office.   Constipation Prevention   Complete by: As directed    Drink plenty of fluids.  Prune juice may be helpful.  You may use a stool softener, such as Colace (over the counter) 100 mg twice a day.  Use MiraLax (over the counter) for constipation as needed.   Diet - low sodium heart healthy   Complete by: As directed    Discharge instructions   Complete by: As directed    Keep VAC dressing clean and dry. Do not remove. Charge VAC unit nightly. Drain care: every 8 hours empty JP drain, record drainage (bring to your appointment), and recharge.   Increase activity slowly as tolerated   Complete by: As directed    Lifting restrictions   Complete by: As directed    No lifting for 6 weeks       Follow-up Information    Perrin Eddleman, Aaron Edelman, MD. Schedule an appointment as soon as possible for a visit on 06/10/2020.   Specialty: Orthopedic Surgery Contact information: [address] [place] [address] [phone]                Signed: Hilton Cork [facility] 06/03/2020, 11:57 AM

## 2020-06-03 NOTE — Plan of Care (Signed)
  Problem: Activity: Goal: Risk for activity intolerance will decrease Outcome: Progressing   Problem: Pain Managment: Goal: General experience of comfort will improve Outcome: Progressing   Problem: Safety: Goal: Ability to remain free from injury will improve Outcome: Progressing   

## 2020-06-07 LAB — AEROBIC/ANAEROBIC CULTURE W GRAM STAIN (SURGICAL/DEEP WOUND): Gram Stain: NONE SEEN

## 2020-06-07 NOTE — Anesthesia Postprocedure Evaluation (Signed)
Anesthesia Post Note  Patient: Misty [name]  Procedure(s) Performed: IRRIGATION AND DEBRIDEMENT LEFT HIP, POSSIBLE HEAD BALL AND LINER EXCHANGE (Left Hip)     Patient location during evaluation: PACU Anesthesia Type: General Level of consciousness: awake and patient cooperative Pain management: pain level controlled Vital Signs Assessment: post-procedure vital signs reviewed and stable Respiratory status: spontaneous breathing, nonlabored ventilation, respiratory function stable and patient connected to nasal cannula oxygen Cardiovascular status: blood pressure returned to baseline and stable Postop Assessment: no apparent nausea or vomiting Anesthetic complications: no   No complications documented.  Last Vitals:  Vitals:   06/03/20 0956 06/03/20 1328  BP: 128/73 129/75  Pulse: 87 96  Resp: 17 16  Temp: 36.6 C 36.8 C  SpO2: 100% 97%    Last Pain:  Vitals:   06/03/20 1526  TempSrc:   PainSc: 8                  [name]

## 2020-06-16 ENCOUNTER — Other Ambulatory Visit: Payer: Self-pay | Admitting: Internal Medicine

## 2020-06-21 DIAGNOSIS — G8929 Other chronic pain: Secondary | ICD-10-CM | POA: Diagnosis not present

## 2020-06-21 DIAGNOSIS — M25551 Pain in right hip: Secondary | ICD-10-CM | POA: Diagnosis not present

## 2020-06-29 ENCOUNTER — Encounter: Payer: Self-pay | Admitting: Internal Medicine

## 2020-06-29 ENCOUNTER — Other Ambulatory Visit: Payer: Self-pay

## 2020-06-29 ENCOUNTER — Ambulatory Visit (INDEPENDENT_AMBULATORY_CARE_PROVIDER_SITE_OTHER): Payer: Medicare Other | Admitting: Internal Medicine

## 2020-06-29 ENCOUNTER — Other Ambulatory Visit: Payer: Self-pay | Admitting: Internal Medicine

## 2020-06-29 VITALS — BP 130/82 | HR 94 | Temp 98.4°F | Ht 64.0 in | Wt 257.0 lb

## 2020-06-29 DIAGNOSIS — R600 Localized edema: Secondary | ICD-10-CM | POA: Insufficient documentation

## 2020-06-29 DIAGNOSIS — E538 Deficiency of other specified B group vitamins: Secondary | ICD-10-CM | POA: Diagnosis not present

## 2020-06-29 DIAGNOSIS — G894 Chronic pain syndrome: Secondary | ICD-10-CM | POA: Diagnosis not present

## 2020-06-29 DIAGNOSIS — M5136 Other intervertebral disc degeneration, lumbar region: Secondary | ICD-10-CM | POA: Diagnosis not present

## 2020-06-29 DIAGNOSIS — M62830 Muscle spasm of back: Secondary | ICD-10-CM

## 2020-06-29 DIAGNOSIS — M791 Myalgia, unspecified site: Secondary | ICD-10-CM | POA: Insufficient documentation

## 2020-06-29 DIAGNOSIS — M47817 Spondylosis without myelopathy or radiculopathy, lumbosacral region: Secondary | ICD-10-CM | POA: Diagnosis not present

## 2020-06-29 DIAGNOSIS — M160 Bilateral primary osteoarthritis of hip: Secondary | ICD-10-CM | POA: Diagnosis not present

## 2020-06-29 MED ORDER — POTASSIUM CHLORIDE ER 10 MEQ PO TBCR: 20.0000 meq | EXTENDED_RELEASE_TABLET | Freq: Every day | ORAL | 0 refills | Status: DC

## 2020-06-29 MED ORDER — BACLOFEN 20 MG PO TABS: 20.0000 mg | ORAL_TABLET | Freq: Every evening | ORAL | 5 refills | Status: DC

## 2020-06-29 MED ORDER — CYANOCOBALAMIN 1000 MCG/ML IJ SOLN
1000.0000 ug | Freq: Once | INTRAMUSCULAR | Status: AC
  Administered 2020-06-29: 1000 ug via INTRAMUSCULAR

## 2020-06-29 MED ORDER — TIZANIDINE HCL 2 MG PO CAPS: 2.0000 mg | ORAL_CAPSULE | Freq: Every evening | ORAL | 0 refills | Status: DC | PRN

## 2020-06-29 MED ORDER — GABAPENTIN 300 MG PO CAPS: 900.0000 mg | ORAL_CAPSULE | Freq: Three times a day (TID) | ORAL | 0 refills | Status: DC

## 2020-06-29 MED ORDER — FUROSEMIDE 40 MG PO TABS: 40.0000 mg | ORAL_TABLET | Freq: Every day | ORAL | 0 refills | Status: DC

## 2020-06-29 NOTE — Assessment & Plan Note (Signed)
Chronic Intermittent Taking gabapentin three times a day - will increase for increase neuropathy to 900 mg TID Tizanidine effective but not covered  - trial of baclofen 20 mg HS

## 2020-06-29 NOTE — Progress Notes (Signed)
Subjective:    Patient ID: Misty [name], female    DOB: [birth year], 60 y.o.   MRN: [id number]  HPI The patient is here for an acute visit.  She had her left hip total arthroplasty on 2/10.  She did develop an infection and wound dehiscence and had to have another surgery.  She has been on 2 different antibiotics for 21 days and finished them today.  She knows she was on Levaquin, but does not know the other antibiotic.  Swollen legs and ankles - she has swelling in both legs and feet.  She has pain from the swelling.  Her legs ache and sting.    She is elevating her legs but it is not helping.  She thinks the swelling started with the antibiotic.  All her muscles ache.  Her arms have a stinging sensation and are heavy.  She has numbness/tingling in her hands.      Had left hip surgery 04/2020 - has had LLE edema.  US done 2/24 and was neg for dvt     Medications and allergies reviewed with patient and updated if appropriate.  Patient Active Problem List   Diagnosis Date Noted  . Surgical wound dehiscence 06/02/2020  . Avascular necrosis of hip, left ([place]) 04/28/2020  . Preoperative clearance 03/21/2020  . Osteoarthritis of left hip 02/19/2020  . COVID-19 virus infection 04/18/2019  . Urinary frequency 03/10/2019  . Restless leg syndrome 03/10/2019  . Hyperlipidemia 11/27/2018  . Anemia 11/25/2018  . Nausea 09/17/2018  . Right groin pain 07/16/2018  . Depression 03/03/2018  . Right shoulder pain 03/03/2018  . Pancreatitis 02/22/2018  . Alcohol abuse 02/22/2018  . Cholelithiasis 02/22/2018  . Adhesive capsulitis of right shoulder 01/20/2018  . Ventral hernia 09/16/2017  . Greater trochanteric bursitis of right hip 04/30/2017  . Prediabetes 04/16/2017  . Numbness 01/29/2017  . Chronic low back pain 07/20/2016  . Unilateral primary osteoarthritis, right hip 07/20/2016  . Spinal stenosis of lumbar region 11/29/2015  . Positive urine drug screen 11/16/2015  .  Chronic pain syndrome 10/12/2015  . Umbilical hernia 03/50/0938  . Sleeping difficulties 07/15/2015  . Bunion, right 06/20/2015  . Muscle spasm 04/16/2015  . B12 deficiency 03/30/2015  . Hammer toe of left foot 02/16/2015  . Fibrosis of skin of lower extremity 02/16/2015  . Cervical spondylosis with radiculopathy 03/01/2014  . Hammer toe of right foot 01/26/2014  . Benign intracranial hypertension 06/18/2013  . Cephalalgia 04/21/2013  . Porokeratosis 03/31/2013  . Vitamin D deficiency 04/18/2010  . MEDIAL MENISCUS TEAR, LEFT 06/30/2008  . BARRETTS ESOPHAGUS 06/28/2008  . Knee osteoarthritis 06/28/2008  . OBESITY 05/24/2008  . HYPERTENSION, BENIGN ESSENTIAL 05/24/2008  . Irritable bowel syndrome 05/24/2008  . BARIATRIC SURGERY STATUS 03/19/2006  . History of malignant neoplasm of large intestine 03/19/1997    Current Outpatient Medications on File Prior to Visit  Medication Sig Dispense Refill  . albuterol (PROAIR HFA) 108 (90 Base) MCG/ACT inhaler Inhale 1-2 puffs into the lungs every 6 (six) hours as needed for wheezing or shortness of breath. 1 each 5  . atorvastatin (LIPITOR) 20 MG tablet Take 1 tablet by mouth once daily (Patient taking differently: Take 20 mg by mouth daily.) 90 tablet 0  . cyanocobalamin (,VITAMIN B-12,) 1000 MCG/ML injection Inject 1,000 mcg into the muscle every 30 (thirty) days.    . diclofenac Sodium (VOLTAREN) 1 % GEL APPLY 2 GRAMS TOPICALLY 4 TIMES DAILY (Patient taking differently: Apply 2 g topically 4 (  four) times daily as needed (pain).) 100 g 0  . docusate sodium (COLACE) 100 MG capsule Take 1 capsule (100 mg total) by mouth 2 (two) times daily as needed for mild constipation. (Patient taking differently: Take 200 mg by mouth daily.) 60 capsule 5  . gabapentin (NEURONTIN) 300 MG capsule TAKE 2 CAPSULES BY MOUTH  DAILY IN THE MORNING AND 2 CAPSULES DAILY IN THE AFTERNOON AND 3 CAPSULES DAILY AT BEDTIME 210 capsule 0  . hydrochlorothiazide (MICROZIDE)  12.5 MG capsule TAKE 1 CAPSULE BY MOUTH ONCE DAILY AS NEEDED FOR  SWELLING  IN  LEGS  OR  ELEVATED  BLOOD  PRESSURE 90 capsule 0  . lisinopril (ZESTRIL) 20 MG tablet Take 1 tablet by mouth once daily 90 tablet 0  . meloxicam (MOBIC) 15 MG tablet Take 1 tablet by mouth once daily with food (Patient taking differently: Take 15 mg by mouth daily.) 90 tablet 0  . methocarbamol (ROBAXIN) 500 MG tablet Take 1 tablet (500 mg total) by mouth every 6 (six) hours as needed for muscle spasms. 60 tablet 1  . naloxone (NARCAN) nasal spray 4 mg/0.1 mL Place 1 spray into the nose as needed (opioid overdose).    . ondansetron (ZOFRAN) 4 MG tablet TAKE 1 TABLET BY MOUTH EVERY 6 HOURS AS NEEDED FOR NAUSEA (Patient taking differently: Take 4 mg by mouth every 6 (six) hours as needed for nausea.) 20 tablet 0  . Oxycodone HCl 10 MG TABS Take 10 mg by mouth 4 (four) times daily as needed for pain.    . Syringe/Needle, Disp, (SYRINGE 3CC/25GX1") 25G X 1" 3 ML MISC Use for monthly B12 injections 3 each 3  . tizanidine (ZANAFLEX) 2 MG capsule Take 1 capsule (2 mg total) by mouth at bedtime as needed for muscle spasms. 30 capsule 0  . XTAMPZA ER 18 MG C12A Take 18 mg by mouth every 12 (twelve) hours.     No current facility-administered medications on file prior to visit.    Past Medical History:  Diagnosis Date  . Allergy   . Arthritis   . Back pain   . Colon cancer ([place])   . Depression   . Diarrhea   . GERD (gastroesophageal reflux disease)   . Hypertension   . Insomnia   . MS (multiple sclerosis) ([place]) 2000   pt says that she doesn't have it  . Other hammer toe (acquired) 03/31/2013  . Pneumonia    hx of  . Pre-diabetes   . Umbilical hernia     Past Surgical History:  Procedure Laterality Date  . ABDOMINAL HYSTERECTOMY    . ANTERIOR CERVICAL DECOMP/DISCECTOMY FUSION N/A 03/01/2014   Procedure: ANTERIOR CERVICAL DECOMPRESSION/DISCECTOMY FUSION 1 LEVEL;  Surgeon: Charlie Pitter, MD;  Location: [facility];  Service: Neurosurgery;  Laterality: N/A;  ANTERIOR CERVICAL DECOMPRESSION/DISCECTOMY FUSION 1 LEVEL  . BUNIONECTOMY Right 10/14/2014   @[facility]   . CESAREAN SECTION    . COLON SURGERY    . COLONOSCOPY    . FOOT SURGERY Right   . GASTRIC BYPASS    . Hammer Toe Repair Right 10/14/2014   RT #2, @[facility]   . INCISION AND DRAINAGE HIP Left 06/02/2020   Procedure: IRRIGATION AND DEBRIDEMENT LEFT HIP, POSSIBLE HEAD BALL AND LINER EXCHANGE;  Surgeon: Rod Can, MD;  Location: [facility];  Service: Orthopedics;  Laterality: Left;  51min  . TENOTOMY Right 10/14/2014   RT #3, @[facility]   . TOTAL HIP ARTHROPLASTY Left 04/28/2020   Procedure: TOTAL HIP  ARTHROPLASTY ANTERIOR APPROACH;  Surgeon: Rod Can, MD;  Location: [facility];  Service: Orthopedics;  Laterality: Left;  3E bed    Social History   Socioeconomic History  . Marital status: Single    Spouse name: Not on file  . Number of children: 2  . Years of education: Not on file  . Highest education level: Not on file  Occupational History  . Occupation: Disability  Tobacco Use  . Smoking status: Former Smoker    Packs/day: 0.25    Years: 20.00    Pack years: 5.00    Types: Cigarettes    Quit date: 09/22/2018    Years since quitting: 1.7  . Smokeless tobacco: Never Used  . Tobacco comment: states she quit 3 months ago  Vaping Use  . Vaping Use: Never used  Substance and Sexual Activity  . Alcohol use: Yes    Alcohol/week: 0.0 standard drinks    Comment: social  . Drug use: No  . Sexual activity: Yes    Partners: Male  Other Topics Concern  . Not on file  Social History Narrative  . Not on file   Social Determinants of Health   Financial Resource Strain: Low Risk   . Difficulty of Paying Living Expenses: Not hard at all  Food Insecurity: Not on file  Transportation Needs: Not on file  Physical Activity: Not on file  Stress: Not on file  Social Connections: Not on file    Family History  Problem Relation Age of Onset  .  Stroke Mother   . Hypertension Mother   . Hyperlipidemia Mother   . Diabetes Mother   . Diabetes Brother   . Hypertension Brother   . Hypertension Brother   . Hypertension Brother   . Hypertension Brother   . Hypertension Brother   . Alcoholism Brother     Review of Systems  Constitutional: Negative for fever.  Respiratory: Negative for cough, shortness of breath and wheezing.   Cardiovascular: Positive for leg swelling. Negative for chest pain and palpitations.  Neurological: Negative for light-headedness and headaches.       Objective:   Vitals:   06/29/20 1522  BP: 130/82  Pulse: 94  Temp: 98.4 F (36.9 C)  SpO2: 99%   BP Readings from Last 3 Encounters:  06/29/20 130/82  06/03/20 129/75  06/01/20 (!) 152/93   Wt Readings from Last 3 Encounters:  06/29/20 257 lb (116.6 kg)  06/02/20 243 lb (110.2 kg)  06/01/20 243 lb (110.2 kg)   Body mass index is 44.11 kg/m.   Physical Exam Constitutional:      General: She is not in acute distress.    Appearance: Normal appearance. She is not ill-appearing.  HENT:     Head: Normocephalic and atraumatic.  Cardiovascular:     Rate and Rhythm: Normal rate and regular rhythm.     Heart sounds: No murmur heard.   Pulmonary:     Effort: Pulmonary effort is normal. No respiratory distress.     Breath sounds: No wheezing or rales.  Musculoskeletal:        General: Tenderness (muscle tenderness) present.     Right lower leg: Edema (1 + pitting to knee) present.     Left lower leg: Edema (1 + pitting up to hip -worse than RLE) present.  Skin:    General: Skin is warm and dry.  Neurological:     Mental Status: She is alert.  VAS [name] LOWER EXTREMITY VENOUS (DVT)  Lower Venous DVT Study  Indications: Left lower extremity pain and swelling. Patient denies SOB.    Risk Factors: Surgery to the left hip. Left total hip arthroplasty on 04/28/2020.  Comparison Study: NA  Performing Technologist: Sharlett Iles RVT    Examination Guidelines: A complete evaluation includes B-mode imaging, spectral Doppler, color Doppler, and power Doppler as needed of all accessible portions of each vessel. Bilateral testing is considered an integral part of a complete examination. Limited examinations for reoccurring indications may be performed as noted. The reflux portion of the exam is performed with the patient in reverse Trendelenburg.     +-----+---------------+---------+-----------+----------+--------------+ RIGHTCompressibilityPhasicitySpontaneityPropertiesThrombus Aging +-----+---------------+---------+-----------+----------+--------------+ CFV  Full           Yes      Yes                                 +-----+---------------+---------+-----------+----------+--------------+        +---------+---------------+---------+-----------+----------+--------------+ LEFT     CompressibilityPhasicitySpontaneityPropertiesThrombus Aging +---------+---------------+---------+-----------+----------+--------------+ CFV      Full           Yes      Yes                                 +---------+---------------+---------+-----------+----------+--------------+ SFJ      Full           Yes      Yes                                 +---------+---------------+---------+-----------+----------+--------------+ FV Prox  Full           Yes      Yes                                 +---------+---------------+---------+-----------+----------+--------------+ FV Mid   Full                                                        +---------+---------------+---------+-----------+----------+--------------+ FV DistalFull           Yes      Yes                                 +---------+---------------+---------+-----------+----------+--------------+ PFV      Full           Yes      Yes                                  +---------+---------------+---------+-----------+----------+--------------+ POP      Full           Yes      Yes                                 +---------+---------------+---------+-----------+----------+--------------+ PTV      Full  Yes      Yes                                 +---------+---------------+---------+-----------+----------+--------------+ PERO     Full                    No                                  +---------+---------------+---------+-----------+----------+--------------+ Gastroc  Full                                                        +---------+---------------+---------+-----------+----------+--------------+ GSV      Full           Yes      Yes                                 +---------+---------------+---------+-----------+----------+--------------+       Left Technical Findings: Limited visualization of the peroneal veins due to increase lower leg swelling. Specifically, the tibioperoneal confluence and popliteal vein are without thrombus.      Summary: RIGHT: - No evidence of common femoral vein obstruction.   LEFT: - No evidence of deep vein thrombosis in the lower extremity. No indirect evidence of obstruction proximal to the inguinal ligament. - No cystic structure found in the popliteal fossa.    *See table(s) above for measurements and observations.  Electronically signed by Larae Grooms MD on 05/13/2020 at 1:00:54 PM.      Final      Assessment & Plan:    See Problem List for Assessment and Plan of chronic medical problems.    This visit occurred during the SARS-CoV-2 public health emergency.  Safety protocols were in place, including screening questions prior to the visit, additional usage of staff PPE, and extensive cleaning of exam room while observing appropriate contact time as indicated for disinfecting solutions.

## 2020-06-29 NOTE — Assessment & Plan Note (Signed)
New Started with the antibiotics that she has been taking for 21 days Whole body muscle aches along with stinging and heaviness ?  Side effect from Levaquin for 21 days-myopathy, neuropathy We will check CK, ESR, CBC and CMP Taking gabapentin 600 mg, 600 mg and 900 mg-we will increase to gabapentin 900 mg 3 times daily Baclofen 20 mg at bedtime

## 2020-06-29 NOTE — Assessment & Plan Note (Signed)
Acute She thinks 3 bilateral lower extremity edema-LLE >RLE started when she started antibiotics She has 1+ bilateral lower extremity edema-left side is much worse and extends up to the upper leg No shortness of breath or other symptoms consistent with heart failure Check CMP, CBC to rule out decreased kidney function Hold hydrochlorothiazide, start Lasix 40 mg daily, start potassium 20 mEq daily Follow-up in 7 days, sooner if needed Continue to elevate legs, continue low-sodium diet Given low suspicion for CHF will hold off on echo

## 2020-06-29 NOTE — Assessment & Plan Note (Signed)
Chronic Monthly B12 injections secondary to malabsorption from gastric bypass B12 injection today

## 2020-06-29 NOTE — Patient Instructions (Addendum)
  Blood work was ordered.     Medications changes include :   Hold hydrochlorothiazide while taking lasix.  Start lasix 40 mg daily and potassium x  10 days. Restart the hydrochlorothiazide after you complete the lasix.    Your prescription(s) have been submitted to your pharmacy. Please take as directed and contact our office if you believe you are having problem(s) with the medication(s).    Follow up in 7 days.

## 2020-06-30 LAB — CBC WITH DIFFERENTIAL/PLATELET
Basophils Absolute: 0 10*3/uL (ref 0.0–0.1)
Basophils Relative: 0.9 % (ref 0.0–3.0)
Eosinophils Absolute: 0.2 10*3/uL (ref 0.0–0.7)
Eosinophils Relative: 4.3 % (ref 0.0–5.0)
HCT: 31.8 % — ABNORMAL LOW (ref 36.0–46.0)
Hemoglobin: 10 g/dL — ABNORMAL LOW (ref 12.0–15.0)
Lymphocytes Relative: 27.2 % (ref 12.0–46.0)
Lymphs Abs: 1.6 10*3/uL (ref 0.7–4.0)
MCHC: 31.4 g/dL (ref 30.0–36.0)
MCV: 90.9 fl (ref 78.0–100.0)
Monocytes Absolute: 0.6 10*3/uL (ref 0.1–1.0)
Monocytes Relative: 9.6 % (ref 3.0–12.0)
Neutro Abs: 3.4 10*3/uL (ref 1.4–7.7)
Neutrophils Relative %: 58 % (ref 43.0–77.0)
Platelets: 215 10*3/uL (ref 150.0–400.0)
RBC: 3.5 Mil/uL — ABNORMAL LOW (ref 3.87–5.11)
RDW: 14.9 % (ref 11.5–15.5)
WBC: 5.8 10*3/uL (ref 4.0–10.5)

## 2020-06-30 LAB — COMPREHENSIVE METABOLIC PANEL
ALT: 7 U/L (ref 0–35)
AST: 15 U/L (ref 0–37)
Albumin: 3.5 g/dL (ref 3.5–5.2)
Alkaline Phosphatase: 108 U/L (ref 39–117)
BUN: 19 mg/dL (ref 6–23)
CO2: 26 mEq/L (ref 19–32)
Calcium: 9.4 mg/dL (ref 8.4–10.5)
Chloride: 105 mEq/L (ref 96–112)
Creatinine, Ser: 0.96 mg/dL (ref 0.40–1.20)
GFR: 64.89 mL/min (ref >60.00)
Glucose, Bld: 81 mg/dL (ref 70–99)
Potassium: 4.3 mEq/L (ref 3.5–5.1)
Sodium: 139 mEq/L (ref 135–145)
Total Bilirubin: 0.4 mg/dL (ref 0.2–1.2)
Total Protein: 6.8 g/dL (ref 6.0–8.3)

## 2020-06-30 LAB — CK: Total CK: 36 U/L (ref 7–177)

## 2020-06-30 LAB — SEDIMENTATION RATE: Sed Rate: 34 mm/hr — ABNORMAL HIGH (ref 0–30)

## 2020-06-30 NOTE — Progress Notes (Signed)
Spoke to patient gave her the lab results. Per patient she is using the bathroom a lot at night, the sting in her hands, feet and legs are better. Also, she is walking better with some tightness in her calves. Overall, she is feeling much better.

## 2020-07-06 DIAGNOSIS — Z96642 Presence of left artificial hip joint: Secondary | ICD-10-CM | POA: Diagnosis not present

## 2020-07-06 DIAGNOSIS — Z471 Aftercare following joint replacement surgery: Secondary | ICD-10-CM | POA: Diagnosis not present

## 2020-07-07 ENCOUNTER — Encounter: Payer: Self-pay | Admitting: Internal Medicine

## 2020-07-07 NOTE — Progress Notes (Deleted)
Subjective:    Patient ID: Misty [name], female    DOB: 08/02/1960, 60 y.o.   MRN: [id number]  HPI The patient is here for follow up of her b/l leg swelling.   One week ago started lasix 40 mg daily and stopped hctz.   Muscle aches, stinging sensation and heaviness in legs.     Medications and allergies reviewed with patient and updated if appropriate.  Patient Active Problem List   Diagnosis Date Noted  . Myalgia 06/29/2020  . Bilateral leg edema 06/29/2020  . Surgical wound dehiscence 06/02/2020  . Avascular necrosis of hip, left ([place]) 04/28/2020  . Preoperative clearance 03/21/2020  . Osteoarthritis of left hip 02/19/2020  . COVID-19 virus infection 04/18/2019  . Urinary frequency 03/10/2019  . Restless leg syndrome 03/10/2019  . Hyperlipidemia 11/27/2018  . Anemia 11/25/2018  . Nausea 09/17/2018  . Right groin pain 07/16/2018  . Depression 03/03/2018  . Right shoulder pain 03/03/2018  . Pancreatitis 02/22/2018  . Alcohol abuse 02/22/2018  . Cholelithiasis 02/22/2018  . Adhesive capsulitis of right shoulder 01/20/2018  . Ventral hernia 09/16/2017  . Greater trochanteric bursitis of right hip 04/30/2017  . Prediabetes 04/16/2017  . Numbness 01/29/2017  . Chronic low back pain 07/20/2016  . Unilateral primary osteoarthritis, right hip 07/20/2016  . Spinal stenosis of lumbar region 11/29/2015  . Positive urine drug screen 11/16/2015  . Umbilical hernia 50/93/2671  . Sleeping difficulties 07/15/2015  . Bunion, right 06/20/2015  . Muscle spasm of back 04/16/2015  . B12 deficiency 03/30/2015  . Hammer toe of left foot 02/16/2015  . Fibrosis of skin of lower extremity 02/16/2015  . Cervical spondylosis with radiculopathy 03/01/2014  . Hammer toe of right foot 01/26/2014  . Benign intracranial hypertension 06/18/2013  . Cephalalgia 04/21/2013  . Porokeratosis 03/31/2013  . Vitamin D deficiency 04/18/2010  . MEDIAL MENISCUS TEAR, LEFT 06/30/2008  .  BARRETTS ESOPHAGUS 06/28/2008  . Knee osteoarthritis 06/28/2008  . OBESITY 05/24/2008  . HYPERTENSION, BENIGN ESSENTIAL 05/24/2008  . Irritable bowel syndrome 05/24/2008  . BARIATRIC SURGERY STATUS 03/19/2006  . History of malignant neoplasm of large intestine 03/19/1997    Current Outpatient Medications on File Prior to Visit  Medication Sig Dispense Refill  . albuterol (PROAIR HFA) 108 (90 Base) MCG/ACT inhaler Inhale 1-2 puffs into the lungs every 6 (six) hours as needed for wheezing or shortness of breath. 1 each 5  . atorvastatin (LIPITOR) 20 MG tablet Take 1 tablet by mouth once daily (Patient taking differently: Take 20 mg by mouth daily.) 90 tablet 0  . baclofen (LIORESAL) 20 MG tablet Take 1 tablet (20 mg total) by mouth at bedtime. 30 each 5  . cyanocobalamin (,VITAMIN B-12,) 1000 MCG/ML injection Inject 1,000 mcg into the muscle every 30 (thirty) days.    . diclofenac Sodium (VOLTAREN) 1 % GEL APPLY 2 GRAMS TOPICALLY 4 TIMES DAILY (Patient taking differently: Apply 2 g topically 4 (four) times daily as needed (pain).) 100 g 0  . docusate sodium (COLACE) 100 MG capsule Take 1 capsule (100 mg total) by mouth 2 (two) times daily as needed for mild constipation. (Patient taking differently: Take 200 mg by mouth daily.) 60 capsule 5  . furosemide (LASIX) 40 MG tablet Take 1 tablet (40 mg total) by mouth daily. 10 tablet 0  . gabapentin (NEURONTIN) 300 MG capsule Take 3 capsules (900 mg total) by mouth 3 (three) times daily.  0  . hydrochlorothiazide (MICROZIDE) 12.5 MG capsule TAKE 1 CAPSULE  BY MOUTH ONCE DAILY AS NEEDED FOR  SWELLING  IN  LEGS  OR  ELEVATED  BLOOD  PRESSURE 90 capsule 0  . lisinopril (ZESTRIL) 20 MG tablet Take 1 tablet by mouth once daily 90 tablet 0  . meloxicam (MOBIC) 15 MG tablet Take 1 tablet by mouth once daily with food (Patient taking differently: Take 15 mg by mouth daily.) 90 tablet 0  . naloxone (NARCAN) nasal spray 4 mg/0.1 mL Place 1 spray into the nose as  needed (opioid overdose).    . ondansetron (ZOFRAN) 4 MG tablet TAKE 1 TABLET BY MOUTH EVERY 6 HOURS AS NEEDED FOR NAUSEA (Patient taking differently: Take 4 mg by mouth every 6 (six) hours as needed for nausea.) 20 tablet 0  . Oxycodone HCl 10 MG TABS Take 10 mg by mouth 4 (four) times daily as needed for pain.    . potassium chloride (KLOR-CON) 10 MEQ tablet Take 2 tablets (20 mEq total) by mouth daily. 10 tablet 0  . Syringe/Needle, Disp, (SYRINGE 3CC/25GX1") 25G X 1" 3 ML MISC Use for monthly B12 injections 3 each 3  . XTAMPZA ER 18 MG C12A Take 18 mg by mouth every 12 (twelve) hours.     No current facility-administered medications on file prior to visit.    Past Medical History:  Diagnosis Date  . Allergy   . Arthritis   . Back pain   . Colon cancer ([place])   . Depression   . Diarrhea   . GERD (gastroesophageal reflux disease)   . Hypertension   . Insomnia   . MS (multiple sclerosis) ([place]) 2000   pt says that she doesn't have it  . Other hammer toe (acquired) 03/31/2013  . Pneumonia    hx of  . Pre-diabetes   . Umbilical hernia     Past Surgical History:  Procedure Laterality Date  . ABDOMINAL HYSTERECTOMY    . ANTERIOR CERVICAL DECOMP/DISCECTOMY FUSION N/A 03/01/2014   Procedure: ANTERIOR CERVICAL DECOMPRESSION/DISCECTOMY FUSION 1 LEVEL;  Surgeon: Charlie Pitter, MD;  Location: [facility];  Service: Neurosurgery;  Laterality: N/A;  ANTERIOR CERVICAL DECOMPRESSION/DISCECTOMY FUSION 1 LEVEL  . BUNIONECTOMY Right 10/14/2014   @[facility]   . CESAREAN SECTION    . COLON SURGERY    . COLONOSCOPY    . FOOT SURGERY Right   . GASTRIC BYPASS    . Hammer Toe Repair Right 10/14/2014   RT #2, @[facility]   . INCISION AND DRAINAGE HIP Left 06/02/2020   Procedure: IRRIGATION AND DEBRIDEMENT LEFT HIP, POSSIBLE HEAD BALL AND LINER EXCHANGE;  Surgeon: Rod Can, MD;  Location: [facility];  Service: Orthopedics;  Laterality: Left;  39min  . TENOTOMY Right 10/14/2014   RT #3, @[facility]   . TOTAL HIP  ARTHROPLASTY Left 04/28/2020   Procedure: TOTAL HIP ARTHROPLASTY ANTERIOR APPROACH;  Surgeon: Rod Can, MD;  Location: [facility];  Service: Orthopedics;  Laterality: Left;  3E bed    Social History   Socioeconomic History  . Marital status: Single    Spouse name: Not on file  . Number of children: 2  . Years of education: Not on file  . Highest education level: Not on file  Occupational History  . Occupation: Disability  Tobacco Use  . Smoking status: Former Smoker    Packs/day: 0.25    Years: 20.00    Pack years: 5.00    Types: Cigarettes    Quit date: 09/22/2018    Years since quitting: 1.7  . Smokeless tobacco: Never Used  .  Tobacco comment: states she quit 3 months ago  Vaping Use  . Vaping Use: Never used  Substance and Sexual Activity  . Alcohol use: Yes    Alcohol/week: 0.0 standard drinks    Comment: social  . Drug use: No  . Sexual activity: Yes    Partners: Male  Other Topics Concern  . Not on file  Social History Narrative  . Not on file   Social Determinants of Health   Financial Resource Strain: Low Risk   . Difficulty of Paying Living Expenses: Not hard at all  Food Insecurity: Not on file  Transportation Needs: Not on file  Physical Activity: Not on file  Stress: Not on file  Social Connections: Not on file    Family History  Problem Relation Age of Onset  . Stroke Mother   . Hypertension Mother   . Hyperlipidemia Mother   . Diabetes Mother   . Diabetes Brother   . Hypertension Brother   . Hypertension Brother   . Hypertension Brother   . Hypertension Brother   . Hypertension Brother   . Alcoholism Brother     Review of Systems     Objective:  There were no vitals filed for this visit. BP Readings from Last 3 Encounters:  06/29/20 130/82  06/03/20 129/75  06/01/20 (!) 152/93   Wt Readings from Last 3 Encounters:  06/29/20 257 lb (116.6 kg)  06/02/20 243 lb (110.2 kg)  06/01/20 243 lb (110.2 kg)   There is no height or  weight on file to calculate BMI.   Physical Exam    Constitutional: Appears well-developed and well-nourished. No distress.  HENT:  Head: Normocephalic and atraumatic.  Neck: Neck supple. No tracheal deviation present. No thyromegaly present.  No cervical lymphadenopathy Cardiovascular: Normal rate, regular rhythm and normal heart sounds.   No murmur heard. No carotid bruit .  No edema Pulmonary/Chest: Effort normal and breath sounds normal. No respiratory distress. No has no wheezes. No rales.  Skin: Skin is warm and dry. Not diaphoretic.  Psychiatric: Normal mood and affect. Behavior is normal.      Assessment & Plan:    See Problem List for Assessment and Plan of chronic medical problems.    This visit occurred during the SARS-CoV-2 public health emergency.  Safety protocols were in place, including screening questions prior to the visit, additional usage of staff PPE, and extensive cleaning of exam room while observing appropriate contact time as indicated for disinfecting solutions.

## 2020-07-08 ENCOUNTER — Telehealth: Payer: Self-pay | Admitting: Emergency Medicine

## 2020-07-08 ENCOUNTER — Telehealth (INDEPENDENT_AMBULATORY_CARE_PROVIDER_SITE_OTHER): Payer: Medicare Other | Admitting: Internal Medicine

## 2020-07-08 DIAGNOSIS — G622 Polyneuropathy due to other toxic agents: Secondary | ICD-10-CM

## 2020-07-08 DIAGNOSIS — R600 Localized edema: Secondary | ICD-10-CM

## 2020-07-08 MED ORDER — FUROSEMIDE 40 MG PO TABS: ORAL_TABLET | ORAL | 0 refills | Status: DC

## 2020-07-08 MED ORDER — POTASSIUM CHLORIDE ER 10 MEQ PO TBCR: 20.0000 meq | EXTENDED_RELEASE_TABLET | Freq: Every day | ORAL | 0 refills | Status: DC

## 2020-07-08 NOTE — Progress Notes (Signed)
Virtual Visit via telephone Note, due to failed video  I connected with Misty [name] on 07/08/20 at  3:20 PM EDT by telephone and verified that I am speaking with the correct person using two identifiers.   I discussed the limitations of evaluation and management by telemedicine and the availability of in person appointments. The patient expressed understanding and agreed to proceed.  Present for the visit:  Myself, Dr Billey Gosling, Bari Mantis.  The patient is currently at home and I am in the office.    No referring provider.    History of Present Illness: This visit is for follow up of her leg edema.  One week ago she was here for bilateral lower extremity edema and I started lasix 40 mg daily and stopped hctz.  She was also started on potassium.  She is also complaining of muscle aches, stinging sensation and heaviness in legs. This is better.  She is still having the sensation, which is concerning.  She was on a prolonged course of Levaquin and my thoughts were possibility of neuropathy.  She is still experiencing bilateral leg swelling.  She states the right leg swelling has improved and more than her baseline, but is better.  The left leg is still very swollen and she cannot even get her shoe on her foot.  She has been compliant with a low-sodium diet.  She has been elevating her legs.  She tried to put the TED hose on the leg, but it is too uncomfortable.  She has not had any shortness of breath or chest pain.  She did follow-up with her orthopedic surgeon and he was unsure why she was having swelling.  She has been taking the baclofen nightly.  She also takes meloxicam daily and gabapentin-both of these are not new and she never had swelling with these in the past.  With taking the furosemide daily she did not notice much increase in urination.  She still feels like she is retaining a lot of fluid and again is not sure why.   Review of Systems  Constitutional: Negative  for chills, diaphoresis and fever.  Respiratory: Negative for cough, shortness of breath and wheezing.   Cardiovascular: Positive for leg swelling. Negative for chest pain.  Skin: Negative for rash.       No redness lower extremities to suggest infection  Neurological: Positive for tingling (Tingling/stinging sensation throughout body). Negative for headaches.       Social History   Socioeconomic History  . Marital status: Single    Spouse name: Not on file  . Number of children: 2  . Years of education: Not on file  . Highest education level: Not on file  Occupational History  . Occupation: Disability  Tobacco Use  . Smoking status: Former Smoker    Packs/day: 0.25    Years: 20.00    Pack years: 5.00    Types: Cigarettes    Quit date: 09/22/2018    Years since quitting: 1.7  . Smokeless tobacco: Never Used  . Tobacco comment: states she quit 3 months ago  Vaping Use  . Vaping Use: Never used  Substance and Sexual Activity  . Alcohol use: Yes    Alcohol/week: 0.0 standard drinks    Comment: social  . Drug use: No  . Sexual activity: Yes    Partners: Male  Other Topics Concern  . Not on file  Social History Narrative  . Not on file   Social Determinants of Health  Financial Resource Strain: Low Risk   . Difficulty of Paying Living Expenses: Not hard at all  Food Insecurity: Not on file  Transportation Needs: Not on file  Physical Activity: Not on file  Stress: Not on file  Social Connections: Not on file     Observations/Objective: Appears well in NAD-I was able to see her briefly on video, but audio failed, which is why we had to switch it to a telephone only phone call.  She was breathing normally and looks to be in no acute distress.   Assessment and Plan:  See Problem List for Assessment and Plan of chronic medical problems.   Follow Up Instructions:    I discussed the assessment and treatment plan with the patient. The patient was provided an  opportunity to ask questions and all were answered. The patient agreed with the plan and demonstrated an understanding of the instructions.   The patient was advised to call back or seek an in-person evaluation if the symptoms worsen or if the condition fails to improve as anticipated.  Time spent telephone call: 25 minutes.  [name], MD

## 2020-07-08 NOTE — Assessment & Plan Note (Signed)
Subacute Slight improvement with furosemide 40 mg daily.  Unfortunately we are limited since this is a telephone call and I am not able to evaluate her legs.  She states her right lower extremity is improved, but still not at baseline.  Her left lower extremity still has significant swelling. Continue elevation, low-sodium diet and walking throughout the day We reviewed her medications in detail and try to figure out why she is having the swelling.  Some of the swelling could be related to prolonged antibiotics and side effects-it sounds like she is experiencing some neuropathy related to Levaquin She was on meloxicam and gabapentin in the past and it never caused swelling like this so she will continue them Advised her to try holding the baclofen-this is newer and this could be contributing.  She is also taking a supplement and she will try holding that, but I doubt that is contributing Will continue furosemide-we will do 80 mg x 2 days and then 40 mg daily.  Continue potassium 20 mEq daily She will follow-up with me in the office next week so we can recheck blood work and reevaluate her legs at that time

## 2020-07-08 NOTE — Assessment & Plan Note (Signed)
Acute Concerned that some of her stinging and tingling sensation throughout her body is related to neuropathy secondary to Levaquin-she was on it for 21 days or so Continue gabapentin 900 mg 3 times daily Will follow-up next week in the office

## 2020-07-08 NOTE — Telephone Encounter (Signed)
Called pt and left message to call office back and schedule follow up visit for next week (mid-week) per Dr Quay Burow.

## 2020-07-08 NOTE — Telephone Encounter (Signed)
-----   Message from [name], MD sent at 07/08/2020  3:57 PM EDT ----- She needs an appt with me early- mid next week for follow up - please call to schedule

## 2020-07-14 ENCOUNTER — Ambulatory Visit: Payer: Medicare Other | Admitting: Internal Medicine

## 2020-07-14 ENCOUNTER — Encounter: Payer: Self-pay | Admitting: Internal Medicine

## 2020-07-14 NOTE — Patient Instructions (Signed)
  Blood work was ordered.     Medications changes include :     Your prescription(s) have been submitted to your pharmacy. Please take as directed and contact our office if you believe you are having problem(s) with the medication(s).   A referral was ordered for        Someone from their office will call you to schedule an appointment.    Please followup in

## 2020-07-14 NOTE — Progress Notes (Signed)
Subjective:    Patient ID: Misty [name], female    DOB: [birth year], 60 y.o.   MRN: [id number]  HPI The patient is here for follow up of her leg edema.    After our last visit she took 80 mg daily x 2 days and then started 40 mg daily.  She held the baclofen.     Medications and allergies reviewed with patient and updated if appropriate.  Patient Active Problem List   Diagnosis Date Noted  . 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 Myalgia 06/29/2020  . Bilateral leg edema 06/29/2020  . Surgical wound dehiscence 06/02/2020  . Avascular necrosis of hip, left ([place]) 04/28/2020  . Preoperative clearance 03/21/2020  . Osteoarthritis of left hip 02/19/2020  . COVID-19 virus infection 04/18/2019  . Urinary frequency 03/10/2019  . Restless leg syndrome 03/10/2019  . Hyperlipidemia 11/27/2018  . Anemia 11/25/2018  . Nausea 09/17/2018  . Right groin pain 07/16/2018  . Depression 03/03/2018  . Right shoulder pain 03/03/2018  . Pancreatitis 02/22/2018  . Alcohol abuse 02/22/2018  . Cholelithiasis 02/22/2018  . Adhesive capsulitis of right shoulder 01/20/2018  . Ventral hernia 09/16/2017  . Greater trochanteric bursitis of right hip 04/30/2017  . Prediabetes 04/16/2017  . Numbness 01/29/2017  . Chronic low back pain 07/20/2016  . Unilateral primary osteoarthritis, right hip 07/20/2016  . Spinal stenosis of lumbar region 11/29/2015  . Positive urine drug screen 11/16/2015  . Umbilical hernia 23/53/6144  . Sleeping difficulties 07/15/2015  . Bunion, right 06/20/2015  . Muscle spasm of back 04/16/2015  . B12 deficiency 03/30/2015  . Hammer toe of left foot 02/16/2015  . Fibrosis of skin of lower extremity 02/16/2015  . Cervical spondylosis with radiculopathy 03/01/2014  . Hammer toe of right foot 01/26/2014  . Benign intracranial hypertension 06/18/2013  . Cephalalgia 04/21/2013  . Porokeratosis 03/31/2013  . Vitamin D deficiency 04/18/2010  . MEDIAL MENISCUS TEAR, LEFT  06/30/2008  . BARRETTS ESOPHAGUS 06/28/2008  . Knee osteoarthritis 06/28/2008  . OBESITY 05/24/2008  . HYPERTENSION, BENIGN ESSENTIAL 05/24/2008  . Irritable bowel syndrome 05/24/2008  . BARIATRIC SURGERY STATUS 03/19/2006  . History of malignant neoplasm of large intestine 03/19/1997    Current Outpatient Medications on File Prior to Visit  Medication Sig Dispense Refill  . albuterol (PROAIR HFA) 108 (90 Base) MCG/ACT inhaler Inhale 1-2 puffs into the lungs every 6 (six) hours as needed for wheezing or shortness of breath. 1 each 5  . atorvastatin (LIPITOR) 20 MG tablet Take 1 tablet by mouth once daily (Patient taking differently: Take 20 mg by mouth daily.) 90 tablet 0  . baclofen (LIORESAL) 20 MG tablet Take 1 tablet (20 mg total) by mouth at bedtime. 30 each 5  . cyanocobalamin (,VITAMIN B-12,) 1000 MCG/ML injection Inject 1,000 mcg into the muscle every 30 (thirty) days.    . diclofenac Sodium (VOLTAREN) 1 % GEL APPLY 2 GRAMS TOPICALLY 4 TIMES DAILY (Patient taking differently: Apply 2 g topically 4 (four) times daily as needed (pain).) 100 g 0  . docusate sodium (COLACE) 100 MG capsule Take 1 capsule (100 mg total) by mouth 2 (two) times daily as needed for mild constipation. (Patient taking differently: Take 200 mg by mouth daily.) 60 capsule 5  . furosemide (LASIX) 40 MG tablet Take 80 mg daily x 2 days and then 40 mg daily in morning 20 tablet 0  . gabapentin (NEURONTIN) 300 MG capsule Take 3 capsules (900 mg total) by mouth 3 (three) times  daily.  0  . lisinopril (ZESTRIL) 20 MG tablet Take 1 tablet by mouth once daily 90 tablet 0  . meloxicam (MOBIC) 15 MG tablet Take 1 tablet by mouth once daily with food (Patient taking differently: Take 15 mg by mouth daily.) 90 tablet 0  . naloxone (NARCAN) nasal spray 4 mg/0.1 mL Place 1 spray into the nose as needed (opioid overdose).    . ondansetron (ZOFRAN) 4 MG tablet TAKE 1 TABLET BY MOUTH EVERY 6 HOURS AS NEEDED FOR NAUSEA (Patient  taking differently: Take 4 mg by mouth every 6 (six) hours as needed for nausea.) 20 tablet 0  . Oxycodone HCl 10 MG TABS Take 10 mg by mouth 4 (four) times daily as needed for pain.    . potassium chloride (KLOR-CON) 10 MEQ tablet Take 2 tablets (20 mEq total) by mouth daily. 20 tablet 0  . Syringe/Needle, Disp, (SYRINGE 3CC/25GX1") 25G X 1" 3 ML MISC Use for monthly B12 injections 3 each 3  . XTAMPZA ER 18 MG C12A Take 18 mg by mouth every 12 (twelve) hours.     No current facility-administered medications on file prior to visit.    Past Medical History:  Diagnosis Date  . Allergy   . Arthritis   . Back pain   . Colon cancer ([place])   . Depression   . Diarrhea   . GERD (gastroesophageal reflux disease)   . Hypertension   . Insomnia   . MS (multiple sclerosis) ([place]) 2000   pt says that she doesn't have it  . Other hammer toe (acquired) 03/31/2013  . Pneumonia    hx of  . Pre-diabetes   . Umbilical hernia     Past Surgical History:  Procedure Laterality Date  . ABDOMINAL HYSTERECTOMY    . ANTERIOR CERVICAL DECOMP/DISCECTOMY FUSION N/A 03/01/2014   Procedure: ANTERIOR CERVICAL DECOMPRESSION/DISCECTOMY FUSION 1 LEVEL;  Surgeon: Charlie Pitter, MD;  Location: [facility];  Service: Neurosurgery;  Laterality: N/A;  ANTERIOR CERVICAL DECOMPRESSION/DISCECTOMY FUSION 1 LEVEL  . BUNIONECTOMY Right 10/14/2014   @[facility]   . CESAREAN SECTION    . COLON SURGERY    . COLONOSCOPY    . FOOT SURGERY Right   . GASTRIC BYPASS    . Hammer Toe Repair Right 10/14/2014   RT #2, @[facility]   . INCISION AND DRAINAGE HIP Left 06/02/2020   Procedure: IRRIGATION AND DEBRIDEMENT LEFT HIP, POSSIBLE HEAD BALL AND LINER EXCHANGE;  Surgeon: Rod Can, MD;  Location: [facility];  Service: Orthopedics;  Laterality: Left;  23min  . TENOTOMY Right 10/14/2014   RT #3, @[facility]   . TOTAL HIP ARTHROPLASTY Left 04/28/2020   Procedure: TOTAL HIP ARTHROPLASTY ANTERIOR APPROACH;  Surgeon: Rod Can, MD;  Location: [facility];   Service: Orthopedics;  Laterality: Left;  3E bed    Social History   Socioeconomic History  . Marital status: Single    Spouse name: Not on file  . Number of children: 2  . Years of education: Not on file  . Highest education level: Not on file  Occupational History  . Occupation: Disability  Tobacco Use  . Smoking status: Former Smoker    Packs/day: 0.25    Years: 20.00    Pack years: 5.00    Types: Cigarettes    Quit date: 09/22/2018    Years since quitting: 1.8  . Smokeless tobacco: Never Used  . Tobacco comment: states she quit 3 months ago  Vaping Use  . Vaping Use: Never used  Substance  and Sexual Activity  . Alcohol use: Yes    Alcohol/week: 0.0 standard drinks    Comment: social  . Drug use: No  . Sexual activity: Yes    Partners: Male  Other Topics Concern  . Not on file  Social History Narrative  . Not on file   Social Determinants of Health   Financial Resource Strain: Low Risk   . Difficulty of Paying Living Expenses: Not hard at all  Food Insecurity: Not on file  Transportation Needs: Not on file  Physical Activity: Not on file  Stress: Not on file  Social Connections: Not on file    Family History  Problem Relation Age of Onset  . Stroke Mother   . Hypertension Mother   . Hyperlipidemia Mother   . Diabetes Mother   . Diabetes Brother   . Hypertension Brother   . Hypertension Brother   . Hypertension Brother   . Hypertension Brother   . Hypertension Brother   . Alcoholism Brother     Review of Systems     Objective:  There were no vitals filed for this visit. BP Readings from Last 3 Encounters:  06/29/20 130/82  06/03/20 129/75  06/01/20 (!) 152/93   Wt Readings from Last 3 Encounters:  06/29/20 257 lb (116.6 kg)  06/02/20 243 lb (110.2 kg)  06/01/20 243 lb (110.2 kg)   There is no height or weight on file to calculate BMI.   Physical Exam    Constitutional: Appears well-developed and well-nourished. No distress.  HENT:   Head: Normocephalic and atraumatic.  Neck: Neck supple. No tracheal deviation present. No thyromegaly present.  No cervical lymphadenopathy Cardiovascular: Normal rate, regular rhythm and normal heart sounds.   No murmur heard. No carotid bruit .  No edema Pulmonary/Chest: Effort normal and breath sounds normal. No respiratory distress. No has no wheezes. No rales.  Skin: Skin is warm and dry. Not diaphoretic.  Psychiatric: Normal mood and affect. Behavior is normal.      Assessment & Plan:    See Problem List for Assessment and Plan of chronic medical problems.    This visit occurred during the SARS-CoV-2 public health emergency.  Safety protocols were in place, including screening questions prior to the visit, additional usage of staff PPE, and extensive cleaning of exam room while observing appropriate contact time as indicated for disinfecting solutions.    This encounter was created in error - please disregard.

## 2020-07-15 ENCOUNTER — Encounter: Payer: Medicare Other | Admitting: Internal Medicine

## 2020-07-15 DIAGNOSIS — R600 Localized edema: Secondary | ICD-10-CM

## 2020-07-17 NOTE — Progress Notes (Signed)
Subjective:    Patient ID: Misty [name], female    DOB: Apr 18, 1960, 60 y.o.   MRN: [id number]  HPI The patient is here for follow up of her leg edema.    After our last visit she took 80 mg daily x 2 days and then started 40 mg daily.  She held the baclofen.   The swelling is all gone.  She has stopped the lasix and potassium.  She no longer has the burning/tingling sensation.   Her BP has been controlled at home.    Her left hip is still painful and she has decreased ROM and is not improving with doing what she is doing at home.  She has f/u with ortho.    Medications and allergies reviewed with patient and updated if appropriate.  Patient Active Problem List   Diagnosis Date Noted  . 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 Myalgia 06/29/2020  . Bilateral leg edema 06/29/2020  . Surgical wound dehiscence 06/02/2020  . Avascular necrosis of hip, left ([place]) 04/28/2020  . Preoperative clearance 03/21/2020  . Osteoarthritis of left hip 02/19/2020  . COVID-19 virus infection 04/18/2019  . Urinary frequency 03/10/2019  . Restless leg syndrome 03/10/2019  . Hyperlipidemia 11/27/2018  . Anemia 11/25/2018  . Nausea 09/17/2018  . Right groin pain 07/16/2018  . Depression 03/03/2018  . Right shoulder pain 03/03/2018  . Pancreatitis 02/22/2018  . Alcohol abuse 02/22/2018  . Cholelithiasis 02/22/2018  . Adhesive capsulitis of right shoulder 01/20/2018  . Ventral hernia 09/16/2017  . Greater trochanteric bursitis of right hip 04/30/2017  . Prediabetes 04/16/2017  . Numbness 01/29/2017  . Chronic low back pain 07/20/2016  . Unilateral primary osteoarthritis, right hip 07/20/2016  . Spinal stenosis of lumbar region 11/29/2015  . Positive urine drug screen 11/16/2015  . Umbilical hernia 40/98/1191  . Sleeping difficulties 07/15/2015  . Bunion, right 06/20/2015  . Muscle spasm of back 04/16/2015  . B12 deficiency 03/30/2015  . Hammer toe of left foot 02/16/2015  . Fibrosis  of skin of lower extremity 02/16/2015  . Cervical spondylosis with radiculopathy 03/01/2014  . Hammer toe of right foot 01/26/2014  . Benign intracranial hypertension 06/18/2013  . Cephalalgia 04/21/2013  . Porokeratosis 03/31/2013  . Vitamin D deficiency 04/18/2010  . MEDIAL MENISCUS TEAR, LEFT 06/30/2008  . BARRETTS ESOPHAGUS 06/28/2008  . Knee osteoarthritis 06/28/2008  . OBESITY 05/24/2008  . HYPERTENSION, BENIGN ESSENTIAL 05/24/2008  . Irritable bowel syndrome 05/24/2008  . BARIATRIC SURGERY STATUS 03/19/2006  . History of malignant neoplasm of large intestine 03/19/1997    Current Outpatient Medications on File Prior to Visit  Medication Sig Dispense Refill  . albuterol (PROAIR HFA) 108 (90 Base) MCG/ACT inhaler Inhale 1-2 puffs into the lungs every 6 (six) hours as needed for wheezing or shortness of breath. 1 each 5  . atorvastatin (LIPITOR) 20 MG tablet Take 1 tablet by mouth once daily (Patient taking differently: Take 20 mg by mouth daily.) 90 tablet 0  . baclofen (LIORESAL) 20 MG tablet Take 1 tablet (20 mg total) by mouth at bedtime. 30 each 5  . cyanocobalamin (,VITAMIN B-12,) 1000 MCG/ML injection Inject 1,000 mcg into the muscle every 30 (thirty) days.    . diclofenac Sodium (VOLTAREN) 1 % GEL APPLY 2 GRAMS TOPICALLY 4 TIMES DAILY (Patient taking differently: Apply 2 g topically 4 (four) times daily as needed (pain).) 100 g 0  . docusate sodium (COLACE) 100 MG capsule Take 1 capsule (100 mg total) by mouth  2 (two) times daily as needed for mild constipation. (Patient taking differently: Take 200 mg by mouth daily.) 60 capsule 5  . furosemide (LASIX) 40 MG tablet Take 80 mg daily x 2 days and then 40 mg daily in morning 20 tablet 0  . gabapentin (NEURONTIN) 300 MG capsule Take 3 capsules (900 mg total) by mouth 3 (three) times daily.  0  . lisinopril (ZESTRIL) 20 MG tablet Take 1 tablet by mouth once daily 90 tablet 0  . meloxicam (MOBIC) 15 MG tablet Take 1 tablet by mouth  once daily with food (Patient taking differently: Take 15 mg by mouth daily.) 90 tablet 0  . naloxone (NARCAN) nasal spray 4 mg/0.1 mL Place 1 spray into the nose as needed (opioid overdose).    . ondansetron (ZOFRAN) 4 MG tablet TAKE 1 TABLET BY MOUTH EVERY 6 HOURS AS NEEDED FOR NAUSEA (Patient taking differently: Take 4 mg by mouth every 6 (six) hours as needed for nausea.) 20 tablet 0  . Oxycodone HCl 10 MG TABS Take 10 mg by mouth 4 (four) times daily as needed for pain.    . potassium chloride (KLOR-CON) 10 MEQ tablet Take 2 tablets (20 mEq total) by mouth daily. 20 tablet 0  . Syringe/Needle, Disp, (SYRINGE 3CC/25GX1") 25G X 1" 3 ML MISC Use for monthly B12 injections 3 each 3  . XTAMPZA ER 18 MG C12A Take 18 mg by mouth every 12 (twelve) hours.     No current facility-administered medications on file prior to visit.    Past Medical History:  Diagnosis Date  . Allergy   . Arthritis   . Back pain   . Colon cancer ([employer])   . Depression   . Diarrhea   . GERD (gastroesophageal reflux disease)   . Hypertension   . Insomnia   . MS (multiple sclerosis) ([place]) 2000   pt says that she doesn't have it  . Other hammer toe (acquired) 03/31/2013  . Pneumonia    hx of  . Pre-diabetes   . Umbilical hernia     Past Surgical History:  Procedure Laterality Date  . ABDOMINAL HYSTERECTOMY    . ANTERIOR CERVICAL DECOMP/DISCECTOMY FUSION N/A 03/01/2014   Procedure: ANTERIOR CERVICAL DECOMPRESSION/DISCECTOMY FUSION 1 LEVEL;  Surgeon: Charlie Pitter, MD;  Location: [facility];  Service: Neurosurgery;  Laterality: N/A;  ANTERIOR CERVICAL DECOMPRESSION/DISCECTOMY FUSION 1 LEVEL  . BUNIONECTOMY Right 10/14/2014   @[facility]   . CESAREAN SECTION    . COLON SURGERY    . COLONOSCOPY    . FOOT SURGERY Right   . GASTRIC BYPASS    . Hammer Toe Repair Right 10/14/2014   RT #2, @[facility]   . INCISION AND DRAINAGE HIP Left 06/02/2020   Procedure: IRRIGATION AND DEBRIDEMENT LEFT HIP, POSSIBLE HEAD BALL AND LINER  EXCHANGE;  Surgeon: Rod Can, MD;  Location: [facility];  Service: Orthopedics;  Laterality: Left;  56min  . TENOTOMY Right 10/14/2014   RT #3, @[facility]   . TOTAL HIP ARTHROPLASTY Left 04/28/2020   Procedure: TOTAL HIP ARTHROPLASTY ANTERIOR APPROACH;  Surgeon: Rod Can, MD;  Location: [facility];  Service: Orthopedics;  Laterality: Left;  3E bed    Social History   Socioeconomic History  . Marital status: Single    Spouse name: Not on file  . Number of children: 2  . Years of education: Not on file  . Highest education level: Not on file  Occupational History  . Occupation: Disability  Tobacco Use  . Smoking status:  Former Smoker    Packs/day: 0.25    Years: 20.00    Pack years: 5.00    Types: Cigarettes    Quit date: 09/22/2018    Years since quitting: 1.8  . Smokeless tobacco: Never Used  . Tobacco comment: states she quit 3 months ago  Vaping Use  . Vaping Use: Never used  Substance and Sexual Activity  . Alcohol use: Yes    Alcohol/week: 0.0 standard drinks    Comment: social  . Drug use: No  . Sexual activity: Yes    Partners: Male  Other Topics Concern  . Not on file  Social History Narrative  . Not on file   Social Determinants of Health   Financial Resource Strain: Low Risk   . Difficulty of Paying Living Expenses: Not hard at all  Food Insecurity: Not on file  Transportation Needs: Not on file  Physical Activity: Not on file  Stress: Not on file  Social Connections: Not on file    Family History  Problem Relation Age of Onset  . Stroke Mother   . Hypertension Mother   . Hyperlipidemia Mother   . Diabetes Mother   . Diabetes Brother   . Hypertension Brother   . Hypertension Brother   . Hypertension Brother   . Hypertension Brother   . Hypertension Brother   . Alcoholism Brother     Review of Systems  Constitutional: Negative for fever.  Cardiovascular: Negative for leg swelling (resolved).  Musculoskeletal: Positive for arthralgias and back  pain.       Objective:   Vitals:   07/18/20 1428  BP: 128/72  Pulse: 98  Temp: 98.6 F (37 C)  SpO2: 98%   BP Readings from Last 3 Encounters:  07/18/20 128/72  06/29/20 130/82  06/03/20 129/75   Wt Readings from Last 3 Encounters:  07/18/20 251 lb (113.9 kg)  06/29/20 257 lb (116.6 kg)  06/02/20 243 lb (110.2 kg)   Body mass index is 43.08 kg/m.   Physical Exam    Constitutional: Appears well-developed and well-nourished. No distress.  HENT:  Head: Normocephalic and atraumatic.  Cardiovascular: Normal rate, regular rhythm and normal heart sounds.   No murmur heard.   No edema Pulmonary/Chest: Effort normal and breath sounds normal. No respiratory distress. No has no wheezes. No rales.  Skin: Skin is warm and dry. Not diaphoretic.  Psychiatric: Normal mood and affect. Behavior is normal.      Assessment & Plan:    See Problem List for Assessment and Plan of chronic medical problems.    This visit occurred during the SARS-CoV-2 public health emergency.  Safety protocols were in place, including screening questions prior to the visit, additional usage of staff PPE, and extensive cleaning of exam room while observing appropriate contact time as indicated for disinfecting solutions.

## 2020-07-18 ENCOUNTER — Other Ambulatory Visit: Payer: Self-pay | Admitting: Internal Medicine

## 2020-07-18 ENCOUNTER — Encounter: Payer: Self-pay | Admitting: Internal Medicine

## 2020-07-18 ENCOUNTER — Other Ambulatory Visit: Payer: Self-pay

## 2020-07-18 ENCOUNTER — Ambulatory Visit (INDEPENDENT_AMBULATORY_CARE_PROVIDER_SITE_OTHER): Payer: Medicare Other | Admitting: Internal Medicine

## 2020-07-18 VITALS — BP 128/72 | HR 98 | Temp 98.6°F | Ht 64.0 in | Wt 251.0 lb

## 2020-07-18 DIAGNOSIS — M545 Low back pain, unspecified: Secondary | ICD-10-CM | POA: Diagnosis not present

## 2020-07-18 DIAGNOSIS — I10 Essential (primary) hypertension: Secondary | ICD-10-CM | POA: Diagnosis not present

## 2020-07-18 DIAGNOSIS — R600 Localized edema: Secondary | ICD-10-CM | POA: Diagnosis not present

## 2020-07-18 DIAGNOSIS — M62830 Muscle spasm of back: Secondary | ICD-10-CM | POA: Diagnosis not present

## 2020-07-18 DIAGNOSIS — G8929 Other chronic pain: Secondary | ICD-10-CM

## 2020-07-18 MED ORDER — GABAPENTIN 300 MG PO CAPS: 900.0000 mg | ORAL_CAPSULE | Freq: Three times a day (TID) | ORAL | 5 refills | Status: DC

## 2020-07-18 MED ORDER — CYCLOBENZAPRINE HCL 5 MG PO TABS: 5.0000 mg | ORAL_TABLET | Freq: Every day | ORAL | 5 refills | Status: DC

## 2020-07-18 NOTE — Patient Instructions (Addendum)
     Medications changes include :   Go back to flexeril at night.     Your prescription(s) have been submitted to your pharmacy. Please take as directed and contact our office if you believe you are having problem(s) with the medication(s).     Please followup in 6 months

## 2020-07-18 NOTE — Assessment & Plan Note (Signed)
Subacute Resolved after taking lasix Lasix and potassium d/c'd

## 2020-07-18 NOTE — Assessment & Plan Note (Signed)
Chronic Intermittent Continue gabapentin 900 mg TID and flexeril 5 mg HS

## 2020-07-18 NOTE — Assessment & Plan Note (Addendum)
Chronic Sees pain management and ortho Continue flexeril 5 mg HS ( did not tolerate baclofen - too sedating) Continue gabapentin 900 mg TID

## 2020-07-18 NOTE — Assessment & Plan Note (Signed)
Chronic BP well controlled Continue lisinopril 20 mg daily   

## 2020-07-21 DIAGNOSIS — M25551 Pain in right hip: Secondary | ICD-10-CM | POA: Diagnosis not present

## 2020-07-21 DIAGNOSIS — G8929 Other chronic pain: Secondary | ICD-10-CM | POA: Diagnosis not present

## 2020-07-27 DIAGNOSIS — Z79891 Long term (current) use of opiate analgesic: Secondary | ICD-10-CM | POA: Diagnosis not present

## 2020-07-27 DIAGNOSIS — M160 Bilateral primary osteoarthritis of hip: Secondary | ICD-10-CM | POA: Diagnosis not present

## 2020-07-27 DIAGNOSIS — M87059 Idiopathic aseptic necrosis of unspecified femur: Secondary | ICD-10-CM | POA: Diagnosis not present

## 2020-07-27 DIAGNOSIS — G894 Chronic pain syndrome: Secondary | ICD-10-CM | POA: Diagnosis not present

## 2020-07-27 DIAGNOSIS — M5136 Other intervertebral disc degeneration, lumbar region: Secondary | ICD-10-CM | POA: Diagnosis not present

## 2020-07-27 DIAGNOSIS — Z79899 Other long term (current) drug therapy: Secondary | ICD-10-CM | POA: Diagnosis not present

## 2020-08-02 ENCOUNTER — Telehealth: Payer: Self-pay | Admitting: Internal Medicine

## 2020-08-02 MED ORDER — FUROSEMIDE 40 MG PO TABS: 40.0000 mg | ORAL_TABLET | Freq: Every day | ORAL | 0 refills | Status: DC | PRN

## 2020-08-02 MED ORDER — POTASSIUM CHLORIDE ER 10 MEQ PO TBCR: 20.0000 meq | EXTENDED_RELEASE_TABLET | Freq: Every day | ORAL | 0 refills | Status: DC | PRN

## 2020-08-02 NOTE — Telephone Encounter (Signed)
How back is the swelling and what caused it? ( standing long periods, sitting too much, excessive salt intake? )   She can take the lasix one pill once a day for a couple of days and that should hopefully get rid of the fluid.  She needs to take the potassium with it.  If it does not go away she should come in to be evaluated.

## 2020-08-02 NOTE — Telephone Encounter (Signed)
Patient has called the office wondering if she should take her Lasix again to get rid of the swelling in her legs or should she be seen?  Please advise. [phone]

## 2020-08-02 NOTE — Telephone Encounter (Signed)
Spoke with patient today and info given. 

## 2020-08-04 ENCOUNTER — Other Ambulatory Visit: Payer: Self-pay

## 2020-08-04 MED ORDER — FUROSEMIDE 40 MG PO TABS: 40.0000 mg | ORAL_TABLET | Freq: Every day | ORAL | 0 refills | Status: DC | PRN

## 2020-08-04 NOTE — Telephone Encounter (Signed)
Pleas send furosemide (LASIX) 40 MG tablet to [id number] [place] [place] [facility] [address]

## 2020-08-04 NOTE — Telephone Encounter (Signed)
Faxed today

## 2020-08-21 DIAGNOSIS — M25551 Pain in right hip: Secondary | ICD-10-CM | POA: Diagnosis not present

## 2020-08-21 DIAGNOSIS — G8929 Other chronic pain: Secondary | ICD-10-CM | POA: Diagnosis not present

## 2020-08-26 DIAGNOSIS — M47817 Spondylosis without myelopathy or radiculopathy, lumbosacral region: Secondary | ICD-10-CM | POA: Diagnosis not present

## 2020-08-26 DIAGNOSIS — M160 Bilateral primary osteoarthritis of hip: Secondary | ICD-10-CM | POA: Diagnosis not present

## 2020-08-26 DIAGNOSIS — M5136 Other intervertebral disc degeneration, lumbar region: Secondary | ICD-10-CM | POA: Diagnosis not present

## 2020-08-26 DIAGNOSIS — G894 Chronic pain syndrome: Secondary | ICD-10-CM | POA: Diagnosis not present

## 2020-08-31 ENCOUNTER — Telehealth: Payer: Self-pay | Admitting: Internal Medicine

## 2020-08-31 NOTE — Telephone Encounter (Signed)
Patient is feeling very fatigue. Recently, she has been eating more ice and feeling fatigue quicker. Requesting lab work and a b-12 injection. She is scheduled for a nurse visit tomorrow in hopes of receiving the b-12.   Please advise.

## 2020-08-31 NOTE — Telephone Encounter (Signed)
Spoke with patient. She is scheduled to see Dr. Volanda Napoleon tomorrow.

## 2020-08-31 NOTE — Telephone Encounter (Signed)
Needs appt - there are other possibilities for her fatigue.  Needs B12 level before she is eligible for injection

## 2020-09-01 ENCOUNTER — Ambulatory Visit: Payer: Medicare Other

## 2020-09-01 ENCOUNTER — Other Ambulatory Visit: Payer: Self-pay | Admitting: Family Medicine

## 2020-09-01 ENCOUNTER — Other Ambulatory Visit: Payer: Self-pay

## 2020-09-01 ENCOUNTER — Encounter: Payer: Self-pay | Admitting: Family Medicine

## 2020-09-01 ENCOUNTER — Ambulatory Visit (INDEPENDENT_AMBULATORY_CARE_PROVIDER_SITE_OTHER): Payer: Medicare Other | Admitting: Family Medicine

## 2020-09-01 VITALS — BP 128/84 | HR 98 | Temp 98.1°F | Wt 261.8 lb

## 2020-09-01 DIAGNOSIS — D649 Anemia, unspecified: Secondary | ICD-10-CM

## 2020-09-01 DIAGNOSIS — R5383 Other fatigue: Secondary | ICD-10-CM | POA: Diagnosis not present

## 2020-09-01 NOTE — Progress Notes (Signed)
Subjective:    Patient ID: Misty [name], female    DOB: Sep 26, 1960, 60 y.o.   MRN: [id number]  Chief Complaint  Patient presents with   Fatigue    States started 1 month ago and is getting worse. Has a desire to eat ice.     HPI Patient was seen today for ongoing concern.  Pt notes feeling tired and weak.  Symptoms started over 1 month ago.  Notes craving ice.  Started the [employer].  Endorses h/o anemia, not currently on iron.  Past Medical History:  Diagnosis Date   Allergy    Arthritis    Back pain    Colon cancer ([place])    Depression    Diarrhea    GERD (gastroesophageal reflux disease)    Hypertension    Insomnia    MS (multiple sclerosis) ([place]) 2000   pt says that she doesn't have it   Other hammer toe (acquired) 03/31/2013   Pneumonia    hx of   Pre-diabetes    Umbilical hernia     Allergies  Allergen Reactions   Phenergan [Promethazine Hcl] Anxiety   Trazodone And Nefazodone Rash    ROS General: Denies fever, chills, night sweats, changes in weight, changes in appetite +fatigue HEENT: Denies headaches, ear pain, changes in vision, rhinorrhea, sore throat CV: Denies CP, palpitations, SOB, orthopnea Pulm: Denies SOB, cough, wheezing GI: Denies abdominal pain, nausea, vomiting, diarrhea, constipation GU: Denies dysuria, hematuria, frequency, vaginal discharge Msk: Denies muscle cramps, joint pains Neuro: Denies weakness, numbness, tingling Skin: Denies rashes, bruising Psych: Denies depression, anxiety, hallucinations    Objective:    Blood pressure 128/84, pulse 98, temperature 98.1 F (36.7 C), temperature source Oral, weight 261 lb 12.8 oz (118.8 kg), SpO2 97 %.  Gen. Pleasant, well-nourished, in no distress, normal affect   HEENT: [place]/AT, face symmetric, conjunctiva clear, no scleral icterus, PERRLA, EOMI, nares patent without drainage Lungs: no accessory muscle use, CTAB, no wheezes or rales Cardiovascular: RRR, no m/r/g, no peripheral  edema Musculoskeletal: No deformities, no cyanosis or clubbing, normal tone Neuro:  A&Ox3, CN II-XII intact, normal gait Skin:  Warm, no lesions/ rash  Wt Readings from Last 3 Encounters:  09/01/20 261 lb 12.8 oz (118.8 kg)  07/18/20 251 lb (113.9 kg)  06/29/20 257 lb (116.6 kg)    Lab Results  Component Value Date   WBC 5.8 06/29/2020   HGB 10.0 (L) 06/29/2020   HCT 31.8 (L) 06/29/2020   PLT 215.0 06/29/2020   GLUCOSE 81 06/29/2020   CHOL 213 (H) 03/21/2020   TRIG 101.0 03/21/2020   HDL 74.90 03/21/2020   LDLCALC 118 (H) 03/21/2020   ALT 7 06/29/2020   AST 15 06/29/2020   NA 139 06/29/2020   K 4.3 06/29/2020   CL 105 06/29/2020   CREATININE 0.96 06/29/2020   BUN 19 06/29/2020   CO2 26 06/29/2020   TSH 0.45 09/23/2019   INR 1.1 06/01/2020   HGBA1C 5.3 06/01/2020    Assessment/Plan:  Fatigue, unspecified type  -chronic -discussed possible causes including vitamin deficiency, anemia, depression, etc -will obtain labs -exercise and other lifestyle modifications encouraged. - Plan: CBC with Differential/Platelet, Ferritin, CMP, TSH, T4, Free, Vitamin B12, Vitamin D, 25-hydroxy, Iron and TIBC  Anemia, unspecified type  -not currently on iron supplement -obtain labs - Plan: CBC with Differential/Platelet, Ferritin, Iron and TIBC, CANCELED: Iron and TIBC  F/u prn with pcp  Grier Mitts, MD

## 2020-09-02 LAB — T4, FREE: Free T4: 0.85 ng/dL (ref 0.60–1.60)

## 2020-09-02 LAB — CBC WITH DIFFERENTIAL/PLATELET
Basophils Absolute: 0.1 10*3/uL (ref 0.0–0.1)
Basophils Relative: 1.3 % (ref 0.0–3.0)
Eosinophils Absolute: 0.1 10*3/uL (ref 0.0–0.7)
Eosinophils Relative: 2.2 % (ref 0.0–5.0)
HCT: 34.2 % — ABNORMAL LOW (ref 36.0–46.0)
Hemoglobin: 10.9 g/dL — ABNORMAL LOW (ref 12.0–15.0)
Lymphocytes Relative: 24.1 % (ref 12.0–46.0)
Lymphs Abs: 1.5 10*3/uL (ref 0.7–4.0)
MCHC: 32 g/dL (ref 30.0–36.0)
MCV: 88.3 fl (ref 78.0–100.0)
Monocytes Absolute: 0.7 10*3/uL (ref 0.1–1.0)
Monocytes Relative: 10.9 % (ref 3.0–12.0)
Neutro Abs: 3.9 10*3/uL (ref 1.4–7.7)
Neutrophils Relative %: 61.5 % (ref 43.0–77.0)
Platelets: 203 10*3/uL (ref 150.0–400.0)
RBC: 3.87 Mil/uL (ref 3.87–5.11)
RDW: 15.3 % (ref 11.5–15.5)
WBC: 6.3 10*3/uL (ref 4.0–10.5)

## 2020-09-02 LAB — IRON AND TIBC
Iron Saturation: 23 % (ref 15–55)
Iron: 79 ug/dL (ref 27–159)
Total Iron Binding Capacity: 337 ug/dL (ref 250–450)
UIBC: 258 ug/dL (ref 131–425)

## 2020-09-02 LAB — FERRITIN: Ferritin: 21.4 ng/mL (ref 10.0–291.0)

## 2020-09-02 LAB — COMPREHENSIVE METABOLIC PANEL
ALT: 10 U/L (ref 0–35)
AST: 16 U/L (ref 0–37)
Albumin: 4 g/dL (ref 3.5–5.2)
Alkaline Phosphatase: 144 U/L — ABNORMAL HIGH (ref 39–117)
BUN: 24 mg/dL — ABNORMAL HIGH (ref 6–23)
CO2: 27 mEq/L (ref 19–32)
Calcium: 9.8 mg/dL (ref 8.4–10.5)
Chloride: 103 mEq/L (ref 96–112)
Creatinine, Ser: 0.88 mg/dL (ref 0.40–1.20)
GFR: 71.95 mL/min (ref >60.00)
Glucose, Bld: 90 mg/dL (ref 70–99)
Potassium: 4.8 mEq/L (ref 3.5–5.1)
Sodium: 138 mEq/L (ref 135–145)
Total Bilirubin: 0.6 mg/dL (ref 0.2–1.2)
Total Protein: 7.1 g/dL (ref 6.0–8.3)

## 2020-09-02 LAB — TSH: TSH: 0.32 u[IU]/mL — ABNORMAL LOW (ref 0.35–4.50)

## 2020-09-02 LAB — VITAMIN B12: Vitamin B-12: 229 pg/mL (ref 211–911)

## 2020-09-02 LAB — VITAMIN D 25 HYDROXY (VIT D DEFICIENCY, FRACTURES): VITD: 9.14 ng/mL — ABNORMAL LOW (ref 30.00–100.00)

## 2020-09-05 ENCOUNTER — Other Ambulatory Visit: Payer: Self-pay | Admitting: Family Medicine

## 2020-09-05 DIAGNOSIS — Z96642 Presence of left artificial hip joint: Secondary | ICD-10-CM | POA: Diagnosis not present

## 2020-09-05 DIAGNOSIS — M25551 Pain in right hip: Secondary | ICD-10-CM | POA: Diagnosis not present

## 2020-09-05 DIAGNOSIS — E559 Vitamin D deficiency, unspecified: Secondary | ICD-10-CM

## 2020-09-05 MED ORDER — VITAMIN D (ERGOCALCIFEROL) 1.25 MG (50000 UNIT) PO CAPS: 50000.0000 [IU] | ORAL_CAPSULE | ORAL | 0 refills | Status: DC

## 2020-09-07 DIAGNOSIS — M5136 Other intervertebral disc degeneration, lumbar region: Secondary | ICD-10-CM | POA: Diagnosis not present

## 2020-09-07 DIAGNOSIS — M47817 Spondylosis without myelopathy or radiculopathy, lumbosacral region: Secondary | ICD-10-CM | POA: Diagnosis not present

## 2020-09-08 DIAGNOSIS — R262 Difficulty in walking, not elsewhere classified: Secondary | ICD-10-CM | POA: Diagnosis not present

## 2020-09-08 DIAGNOSIS — M25552 Pain in left hip: Secondary | ICD-10-CM | POA: Diagnosis not present

## 2020-09-08 DIAGNOSIS — M545 Low back pain, unspecified: Secondary | ICD-10-CM | POA: Diagnosis not present

## 2020-09-13 DIAGNOSIS — M545 Low back pain, unspecified: Secondary | ICD-10-CM | POA: Diagnosis not present

## 2020-09-13 DIAGNOSIS — R262 Difficulty in walking, not elsewhere classified: Secondary | ICD-10-CM | POA: Diagnosis not present

## 2020-09-13 DIAGNOSIS — M25552 Pain in left hip: Secondary | ICD-10-CM | POA: Diagnosis not present

## 2020-09-16 DIAGNOSIS — M545 Low back pain, unspecified: Secondary | ICD-10-CM | POA: Diagnosis not present

## 2020-09-16 DIAGNOSIS — R262 Difficulty in walking, not elsewhere classified: Secondary | ICD-10-CM | POA: Diagnosis not present

## 2020-09-16 DIAGNOSIS — M25552 Pain in left hip: Secondary | ICD-10-CM | POA: Diagnosis not present

## 2020-09-20 ENCOUNTER — Other Ambulatory Visit: Payer: Self-pay | Admitting: Internal Medicine

## 2020-09-20 DIAGNOSIS — R262 Difficulty in walking, not elsewhere classified: Secondary | ICD-10-CM | POA: Diagnosis not present

## 2020-09-20 DIAGNOSIS — G8929 Other chronic pain: Secondary | ICD-10-CM | POA: Diagnosis not present

## 2020-09-20 DIAGNOSIS — M25551 Pain in right hip: Secondary | ICD-10-CM | POA: Diagnosis not present

## 2020-09-20 DIAGNOSIS — M545 Low back pain, unspecified: Secondary | ICD-10-CM | POA: Diagnosis not present

## 2020-09-20 DIAGNOSIS — M25552 Pain in left hip: Secondary | ICD-10-CM | POA: Diagnosis not present

## 2020-09-22 DIAGNOSIS — M25552 Pain in left hip: Secondary | ICD-10-CM | POA: Diagnosis not present

## 2020-09-22 DIAGNOSIS — M545 Low back pain, unspecified: Secondary | ICD-10-CM | POA: Diagnosis not present

## 2020-09-22 DIAGNOSIS — R262 Difficulty in walking, not elsewhere classified: Secondary | ICD-10-CM | POA: Diagnosis not present

## 2020-09-26 ENCOUNTER — Encounter: Payer: Self-pay | Admitting: Podiatry

## 2020-09-26 ENCOUNTER — Other Ambulatory Visit: Payer: Self-pay

## 2020-09-26 ENCOUNTER — Ambulatory Visit (INDEPENDENT_AMBULATORY_CARE_PROVIDER_SITE_OTHER): Payer: Medicare Other | Admitting: Podiatry

## 2020-09-26 DIAGNOSIS — Q828 Other specified congenital malformations of skin: Secondary | ICD-10-CM

## 2020-09-26 DIAGNOSIS — Z79891 Long term (current) use of opiate analgesic: Secondary | ICD-10-CM | POA: Diagnosis not present

## 2020-09-26 DIAGNOSIS — G894 Chronic pain syndrome: Secondary | ICD-10-CM | POA: Diagnosis not present

## 2020-09-26 DIAGNOSIS — M5136 Other intervertebral disc degeneration, lumbar region: Secondary | ICD-10-CM | POA: Diagnosis not present

## 2020-09-26 DIAGNOSIS — M79671 Pain in right foot: Secondary | ICD-10-CM | POA: Diagnosis not present

## 2020-09-26 DIAGNOSIS — M79605 Pain in left leg: Secondary | ICD-10-CM | POA: Diagnosis not present

## 2020-09-26 DIAGNOSIS — M160 Bilateral primary osteoarthritis of hip: Secondary | ICD-10-CM | POA: Diagnosis not present

## 2020-09-26 DIAGNOSIS — Z79899 Other long term (current) drug therapy: Secondary | ICD-10-CM | POA: Diagnosis not present

## 2020-09-26 NOTE — Progress Notes (Signed)
This patient presents to the office to have her callus trimmed on her right foot..    She says the callus is painful walking.  .  She presents for preventative foot care services.   Vascular  Dorsalis pedis and posterior tibial pulses are palpable  B/L.  Capillary return  WNL.  Temperature gradient is  WNL.  Skin turgor  WNL  Sensorium  Senn Weinstein monofilament wire  WNL. Normal tactile sensation.  Nail Exam  Patient has normal nails with no evidence of bacterial or fungal infection.  Orthopedic  Exam  Muscle tone and muscle strength  WNL.  No limitations of motion feet  B/L.  No crepitus or joint effusion noted.  Foot type is unremarkable and digits show no abnormalities.  HAV 1st MPJ right foot.  Skin  No open lesions.  Normal skin texture and turgor. Porokeratosis sub 3,5 right foot.  Porokeratosis  Right foot.  Debride porokeratosis using a # 15 blade.  RTC 10 weeks .     Gardiner Barefoot DPM

## 2020-09-27 DIAGNOSIS — M545 Low back pain, unspecified: Secondary | ICD-10-CM | POA: Diagnosis not present

## 2020-09-27 DIAGNOSIS — M25552 Pain in left hip: Secondary | ICD-10-CM | POA: Diagnosis not present

## 2020-09-27 DIAGNOSIS — R262 Difficulty in walking, not elsewhere classified: Secondary | ICD-10-CM | POA: Diagnosis not present

## 2020-09-28 DIAGNOSIS — M25551 Pain in right hip: Secondary | ICD-10-CM | POA: Diagnosis not present

## 2020-10-05 ENCOUNTER — Telehealth: Payer: Self-pay

## 2020-10-05 ENCOUNTER — Telehealth: Payer: Self-pay | Admitting: Pharmacist

## 2020-10-05 DIAGNOSIS — M25552 Pain in left hip: Secondary | ICD-10-CM | POA: Diagnosis not present

## 2020-10-05 DIAGNOSIS — M545 Low back pain, unspecified: Secondary | ICD-10-CM | POA: Diagnosis not present

## 2020-10-05 DIAGNOSIS — R262 Difficulty in walking, not elsewhere classified: Secondary | ICD-10-CM | POA: Diagnosis not present

## 2020-10-05 NOTE — Telephone Encounter (Signed)
Per phone encounter on 6/15, PCP requested pt have appt due to "her fatigue. Needs B12 level before she is eligible for injection."  Pt had OV with DR Volanda Napoleon on 6/16, "b12 low normal at 229" per [facility] result note with recommendation to schedule appt with pcp to further review labs.  Pt notified of result note on 6/20 & states she will f/u with pcp.  No f/u appt made; no order for b12 injections.    LVM instructing pt to call before coming to appt on 7/22 as PCP is out of office & unable to provide order.

## 2020-10-05 NOTE — Telephone Encounter (Addendum)
I have spoken with the pt and was able to inform her of your note about her b12. Pt states she was told by Dr. Quay Burow to call to have her b12 injection done before pt leaves on her cruise and pt sates she needs her B12.  *Sent to [place] to advise further.

## 2020-10-05 NOTE — Chronic Care Management (AMB) (Addendum)
Chronic Care Management Pharmacy Assistant   Name: Misty [name]  MRN: [id number] DOB: 1960-12-21   Reason for Encounter: Disease State - General Adherence     Recent office visits:  07/18/20 Billey Gosling, MD (PCP) - Chronic Low Back Pain - Flexeril 5 mg at bedtime prescription sent, follow up in 6 months.  07/08/20 Billey Gosling, MD (PCP) (Video visit) - Bilateral leg edema - Furosemide 40 mg (take 80 mg daily x 2 days then 40 mg daily in morning) prescription sent, Follow up next week as scheduled.  06/29/20 Billey Gosling, MD (PCP) - Bilateral Leg Edema - labs ordered, Started Lasix 40 mg daily and Potassium 20 meq daily, Follow up in 1 week.   Recent consult visits:  09/26/20 Gardiner Barefoot, DPM (Podiatry) - Porokeratosis - no medication changes, Follow up in 3 months.  09/01/20 Grier Mitts, MD- Family Medicine - Fatigue - labs ordered, no medication changes, follow up as needed.   05/30/20 Gwenlyn Found, - Chronic Pain Syndrome - no notes available.    Hospital visits:  Medication Reconciliation was completed by comparing discharge summary, patient's EMR and Pharmacy list, and upon discussion with patient.  Admitted to the hospital on 06/02/20 due to Surgical Wound Dehiscence. Discharge date was 06/03/20. Discharged from [place]?Medications Started at [facility] Discharge:?? -started Doxycyline 100 mg take 1 capsule by mouth twice daily for 14 days due to surgical wound.   Medication Changes at Hospital Discharge: -Changed Ondansetron 4 mg 1 tablet every 6 hours as needed for nausea, Gabapentin 300 mg take 2 capsules by mouth in am and 2 capsules by mouth in afternoon and 3 capsules at bedtime, Oxycodone 18 mg every 12 hours, and Oxycodone 10 mg 4 times daily as needed for pain.   Medications Discontinued at Hospital Discharge: None noted.   Medications that remain the same after Hospital Discharge:??  -All other medications will remain the same.     Medications: Outpatient Encounter Medications as of 10/05/2020  Medication Sig   albuterol (PROAIR HFA) 108 (90 Base) MCG/ACT inhaler Inhale 1-2 puffs into the lungs every 6 (six) hours as needed for wheezing or shortness of breath.   atorvastatin (LIPITOR) 20 MG tablet Take 1 tablet by mouth once daily (Patient taking differently: Take 20 mg by mouth daily.)   cyanocobalamin (,VITAMIN B-12,) 1000 MCG/ML injection Inject 1,000 mcg into the muscle every 30 (thirty) days.   cyclobenzaprine (FLEXERIL) 5 MG tablet Take 1 tablet (5 mg total) by mouth at bedtime.   diclofenac Sodium (VOLTAREN) 1 % GEL APPLY 2 GRAMS TOPICALLY 4 TIMES DAILY (Patient taking differently: Apply 2 g topically 4 (four) times daily as needed (pain).)   docusate sodium (COLACE) 100 MG capsule Take 1 capsule (100 mg total) by mouth 2 (two) times daily as needed for mild constipation. (Patient taking differently: Take 200 mg by mouth daily.)   furosemide (LASIX) 40 MG tablet Take 1 tablet (40 mg total) by mouth daily as needed for fluid.   gabapentin (NEURONTIN) 300 MG capsule Take 3 capsules (900 mg total) by mouth 3 (three) times daily.   lisinopril (ZESTRIL) 20 MG tablet Take 1 tablet by mouth once daily   meloxicam (MOBIC) 15 MG tablet Take 1 tablet by mouth once daily with food (Patient taking differently: Take 15 mg by mouth daily.)   naloxone (NARCAN) nasal spray 4 mg/0.1 mL Place 1 spray into the nose as needed (opioid overdose).   ondansetron (ZOFRAN)  4 MG tablet TAKE 1 TABLET BY MOUTH EVERY 6 HOURS AS NEEDED FOR NAUSEA (Patient taking differently: Take 4 mg by mouth every 6 (six) hours as needed for nausea.)   Oxycodone HCl 10 MG TABS Take 10 mg by mouth 4 (four) times daily as needed for pain.   potassium chloride (KLOR-CON) 10 MEQ tablet Take 2 tablets (20 mEq total) by mouth daily as needed (lasix with lasix).   Syringe/Needle, Disp, (SYRINGE 3CC/25GX1") 25G X 1" 3 ML MISC Use for monthly B12 injections   Vitamin D,  Ergocalciferol, (DRISDOL) 1.25 MG (50000 UNIT) CAPS capsule Take 1 capsule (50,000 Units total) by mouth every 7 (seven) days.   XTAMPZA ER 18 MG C12A Take 18 mg by mouth every 12 (twelve) hours.   No facility-administered encounter medications on file as of 10/05/2020.   Have you had any problems recently with your health? Patient states she just came back from a cruise with her daughter, her daughter had concerns about her being drowsy a lot after taking her meds.   Have you had any problems with your pharmacy? Patient states no problems with pharmacy but she has moved and would like to try [facility] if its compatible [facility].  What issues or side effects are you having with your medications? Patient states she has been taking only 2 of her Gabapentin instead of 3 a day.  What would you like me to pass along to [facility], CPP for  them to help you with?  Patient would like to know if some of her meds are causing her to be drowsy, she states she almost fell asleep driving home the other day from her daughter's house.  What can we do to take care of you better? Patient states she wants to know if she needs an appointment to have her meds re-evaluated.  Star Rating Drugs: Lisinopril 20 mg - last filled 09/20/20 90 days  Atorvastatin 20 MG tablet - last filled 08/20/20 90 days    Orinda Kenner, RMA Clinical Pharmacists Assistant [phone]  I have reviewed the care management and care coordination activities outlined in this encounter and I am certifying that I agree with the content of this note. No further action required. Pt has appt with Mendel Ryder scheduled end of August.  Debbora Dus, PharmD Clinical Pharmacist [facility] [phone]

## 2020-10-06 NOTE — Telephone Encounter (Signed)
Patient made aware order to be done for B12

## 2020-10-07 ENCOUNTER — Ambulatory Visit (INDEPENDENT_AMBULATORY_CARE_PROVIDER_SITE_OTHER): Payer: Medicare Other

## 2020-10-07 ENCOUNTER — Other Ambulatory Visit: Payer: Self-pay

## 2020-10-07 DIAGNOSIS — E538 Deficiency of other specified B group vitamins: Secondary | ICD-10-CM | POA: Diagnosis not present

## 2020-10-07 MED ORDER — CYANOCOBALAMIN 1000 MCG/ML IJ SOLN
1000.0000 ug | Freq: Once | INTRAMUSCULAR | Status: AC
  Administered 2020-10-07: 1000 ug via INTRAMUSCULAR

## 2020-10-07 NOTE — Progress Notes (Signed)
Pt here for monthly B12 injection per Dr. Quay Burow  B12 1057mg given IM, and pt tolerated injection well.  Next B12 injection scheduled for 11/07/20

## 2020-10-19 DIAGNOSIS — M25552 Pain in left hip: Secondary | ICD-10-CM | POA: Diagnosis not present

## 2020-10-19 DIAGNOSIS — R262 Difficulty in walking, not elsewhere classified: Secondary | ICD-10-CM | POA: Diagnosis not present

## 2020-10-19 DIAGNOSIS — M545 Low back pain, unspecified: Secondary | ICD-10-CM | POA: Diagnosis not present

## 2020-10-21 DIAGNOSIS — M25551 Pain in right hip: Secondary | ICD-10-CM | POA: Diagnosis not present

## 2020-10-21 DIAGNOSIS — G894 Chronic pain syndrome: Secondary | ICD-10-CM | POA: Diagnosis not present

## 2020-10-25 DIAGNOSIS — R262 Difficulty in walking, not elsewhere classified: Secondary | ICD-10-CM | POA: Diagnosis not present

## 2020-10-25 DIAGNOSIS — M545 Low back pain, unspecified: Secondary | ICD-10-CM | POA: Diagnosis not present

## 2020-10-25 DIAGNOSIS — M25552 Pain in left hip: Secondary | ICD-10-CM | POA: Diagnosis not present

## 2020-10-26 DIAGNOSIS — G894 Chronic pain syndrome: Secondary | ICD-10-CM | POA: Diagnosis not present

## 2020-10-26 DIAGNOSIS — M160 Bilateral primary osteoarthritis of hip: Secondary | ICD-10-CM | POA: Diagnosis not present

## 2020-10-26 DIAGNOSIS — M5136 Other intervertebral disc degeneration, lumbar region: Secondary | ICD-10-CM | POA: Diagnosis not present

## 2020-10-26 DIAGNOSIS — M4726 Other spondylosis with radiculopathy, lumbar region: Secondary | ICD-10-CM | POA: Diagnosis not present

## 2020-10-27 DIAGNOSIS — R262 Difficulty in walking, not elsewhere classified: Secondary | ICD-10-CM | POA: Diagnosis not present

## 2020-10-27 DIAGNOSIS — M545 Low back pain, unspecified: Secondary | ICD-10-CM | POA: Diagnosis not present

## 2020-10-27 DIAGNOSIS — M25552 Pain in left hip: Secondary | ICD-10-CM | POA: Diagnosis not present

## 2020-11-07 ENCOUNTER — Ambulatory Visit: Payer: Medicare Other

## 2020-11-07 ENCOUNTER — Telehealth: Payer: Medicare Other

## 2020-11-07 DIAGNOSIS — E538 Deficiency of other specified B group vitamins: Secondary | ICD-10-CM
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
## 2020-11-14 ENCOUNTER — Other Ambulatory Visit: Payer: Self-pay

## 2020-11-14 ENCOUNTER — Ambulatory Visit (INDEPENDENT_AMBULATORY_CARE_PROVIDER_SITE_OTHER): Payer: Medicare Other | Admitting: Pharmacist

## 2020-11-14 DIAGNOSIS — G8929 Other chronic pain: Secondary | ICD-10-CM

## 2020-11-14 DIAGNOSIS — E7849 Other hyperlipidemia: Secondary | ICD-10-CM | POA: Diagnosis not present

## 2020-11-14 DIAGNOSIS — I10 Essential (primary) hypertension: Secondary | ICD-10-CM | POA: Diagnosis not present

## 2020-11-14 DIAGNOSIS — M545 Low back pain, unspecified: Secondary | ICD-10-CM

## 2020-11-14 NOTE — Patient Instructions (Signed)
Visit Information  Phone number for Pharmacist: [phone]   Goals Addressed             This Visit's Progress    Manage My Medicine       Timeframe:  Long-Range Goal Priority:  Medium Start Date:    11/14/20                         Expected End Date:   11/14/21                    Follow Up Date Feb 2023   - call for medicine refill 2 or 3 days before it runs out - call if I am sick and can't take my medicine - keep a list of all the medicines I take; vitamins and herbals too - use a pillbox to sort medicine    Why is this important?   These steps will help you keep on track with your medicines.   Notes:         Care Plan : [place]  Updates made by Charlton Haws, RPH since 11/14/2020 12:00 AM     Problem: Hypertension, Hyperlipidemia and Chronic pain   Priority: High     Long-Range Goal: Disease management   Start Date: 05/10/2020  Expected End Date: 05/10/2021  This Visit's Progress: On track  Recent Progress: On track  Priority: High  Note:   Current Barriers:  Unable to independently monitor therapeutic efficacy  Pharmacist Clinical Goal(s):  Patient will achieve adherence to monitoring guidelines and medication adherence to achieve therapeutic efficacy through collaboration with PharmD and provider.   Interventions: 1:1 collaboration with [name], MD regarding development and update of comprehensive plan of care as evidenced by provider attestation and co-signature Inter-disciplinary care team collaboration (see longitudinal plan of care) Comprehensive medication review performed; medication list updated in electronic medical record  Hypertension (BP goal < 130/80) Controlled - BP at goal in recent office visits; pt does not check frequently at home but denies s/sx of hypotension Current regimen:  Lisinopril 20 mg daily Furosemide 40 mg daily PRN Interventions: Discussed blood pressure goals and importance of maintaining  BP in optimal range 100/60 to 130/80 Recommend to continue current medication   Hyperlipidemia (LDL goal < 100) Not ideally controlled - but improving, LDL down from 144 to 118 from July 2021 to Jan 2022 after statin added Current regimen:  Atorvastatin 20 mg daily Aspirin 81 mg daily Interventions: Discussed cholesterol goals and benefits of medications for prevention of heart attack / stroke Recommend to continue current medication   Chronic Pain (hip, back) Not ideally controlled - pt reports she is in near constant pain, she has to walk stooped over; she reports she is compliant with PT, stretching and medication therapy and nothing is getting better; she is frustrated with her medical treatment and reports no one and nothing has really helped her; she is researching different specialists and may come to PCP for referral requests - managed per Dr Vira Blanco (pain mgmt) - History: pt was dianosed with MS 2004 and received copaxone injections for ~10 years; 2016-2017 she saw a new neurologist and found she never had MS and stopped injections; muscle spasms/pain has worsened since then; neurologist did not find a neurolgical cause for her symptoms - psychosomatic cause has been mentioned Current regimen:  Oxycodone HCL 10 mg QID - #120 per month Xtampza (oxycodone) ER 13.5 mg twice  a day - #60 per month Gabapentin 300 mg - 2 cap TID Diclofenac 1% gel: 2-3 times daily  Meloxicam 15 mg daily Cyclobenzaprine 5 mg HS Previously tried: duloxetine, pregabalin, tizanidine, baclofen, methocarbamol, celecoxib, tramadol Interventions: Discussed importance of following up with pain managements and adhering to physical therapy recommendations Discussed importance of medication organization to prevent overdose Recommended to speak to pain specialist about alternative treatments  Patient Goals/Self-Care Activities Patient will:  -take medications as prescribed -focus on medication adherence by pill  box -check blood pressure daily, document, and provide at future appointments -Use a pill box to organize oxycodone  -Keep Narcan on hand in case of accidental overdose -Follow with pain management and adhere to recommendations      The patient verbalized understanding of instructions, educational materials, and care plan provided today and declined offer to receive copy of patient instructions, educational materials, and care plan.  Telephone follow up appointment with pharmacy team member scheduled for: 6 months  Charlene Brooke, PharmD, [place], CPP Clinical Pharmacist [facility] at [facility] [phone]
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
## 2020-11-15 DIAGNOSIS — M545 Low back pain, unspecified: Secondary | ICD-10-CM | POA: Diagnosis not present

## 2020-11-15 DIAGNOSIS — M25552 Pain in left hip: Secondary | ICD-10-CM | POA: Diagnosis not present

## 2020-11-15 DIAGNOSIS — R262 Difficulty in walking, not elsewhere classified: Secondary | ICD-10-CM | POA: Diagnosis not present

## 2020-11-21 ENCOUNTER — Other Ambulatory Visit: Payer: Self-pay | Admitting: Internal Medicine

## 2020-11-21 DIAGNOSIS — G894 Chronic pain syndrome: Secondary | ICD-10-CM | POA: Diagnosis not present

## 2020-11-21 DIAGNOSIS — M25551 Pain in right hip: Secondary | ICD-10-CM | POA: Diagnosis not present

## 2020-11-23 DIAGNOSIS — Z79891 Long term (current) use of opiate analgesic: Secondary | ICD-10-CM | POA: Diagnosis not present

## 2020-11-23 DIAGNOSIS — G894 Chronic pain syndrome: Secondary | ICD-10-CM | POA: Diagnosis not present

## 2020-11-23 DIAGNOSIS — M160 Bilateral primary osteoarthritis of hip: Secondary | ICD-10-CM | POA: Diagnosis not present

## 2020-11-23 DIAGNOSIS — Z79899 Other long term (current) drug therapy: Secondary | ICD-10-CM | POA: Diagnosis not present

## 2020-11-23 DIAGNOSIS — M5136 Other intervertebral disc degeneration, lumbar region: Secondary | ICD-10-CM | POA: Diagnosis not present

## 2020-11-23 DIAGNOSIS — M4726 Other spondylosis with radiculopathy, lumbar region: Secondary | ICD-10-CM | POA: Diagnosis not present

## 2020-11-24 DIAGNOSIS — M25552 Pain in left hip: Secondary | ICD-10-CM | POA: Diagnosis not present

## 2020-11-24 DIAGNOSIS — R262 Difficulty in walking, not elsewhere classified: Secondary | ICD-10-CM | POA: Diagnosis not present

## 2020-11-24 DIAGNOSIS — M545 Low back pain, unspecified: Secondary | ICD-10-CM | POA: Diagnosis not present

## 2020-11-25 ENCOUNTER — Telehealth: Payer: Self-pay | Admitting: Pharmacist

## 2020-11-25 NOTE — Progress Notes (Signed)
    Chronic Care Management Pharmacy Assistant   Name: Misty [name] MRN: [id number]  DOB: August 26, 1960   Reason for Encounter: Disease State-General Adherence  Recent office visits:  None listed  Recent consult visits:  None listed  Hospital visits:  None in previous 6 months  Medications: Outpatient Encounter Medications as of 11/25/2020  Medication Sig   albuterol (PROAIR HFA) 108 (90 Base) MCG/ACT inhaler Inhale 1-2 puffs into the lungs every 6 (six) hours as needed for wheezing or shortness of breath.   atorvastatin (LIPITOR) 20 MG tablet Take 1 tablet by mouth once daily   cyanocobalamin (,VITAMIN B-12,) 1000 MCG/ML injection Inject 1,000 mcg into the muscle every 30 (thirty) days.   cyclobenzaprine (FLEXERIL) 5 MG tablet Take 1 tablet (5 mg total) by mouth at bedtime.   diclofenac Sodium (VOLTAREN) 1 % GEL APPLY 2 GRAMS TOPICALLY 4 TIMES DAILY (Patient taking differently: Apply 2 g topically 4 (four) times daily as needed (pain).)   docusate sodium (COLACE) 100 MG capsule Take 1 capsule (100 mg total) by mouth 2 (two) times daily as needed for mild constipation. (Patient taking differently: Take 200 mg by mouth daily.)   furosemide (LASIX) 40 MG tablet Take 1 tablet (40 mg total) by mouth daily as needed for fluid.   gabapentin (NEURONTIN) 300 MG capsule Take 3 capsules (900 mg total) by mouth 3 (three) times daily.   lisinopril (ZESTRIL) 20 MG tablet Take 1 tablet by mouth once daily   meloxicam (MOBIC) 15 MG tablet Take 1 tablet by mouth once daily with food (Patient taking differently: Take 15 mg by mouth daily.)   naloxone (NARCAN) nasal spray 4 mg/0.1 mL Place 1 spray into the nose as needed (opioid overdose).   ondansetron (ZOFRAN) 4 MG tablet TAKE 1 TABLET BY MOUTH EVERY 6 HOURS AS NEEDED FOR NAUSEA (Patient taking differently: Take 4 mg by mouth every 6 (six) hours as needed for nausea.)   Oxycodone HCl 10 MG TABS Take 10 mg by mouth 4 (four) times daily as needed for  pain.   potassium chloride (KLOR-CON) 10 MEQ tablet Take 2 tablets (20 mEq total) by mouth daily as needed (lasix with lasix).   Syringe/Needle, Disp, (SYRINGE 3CC/25GX1") 25G X 1" 3 ML MISC Use for monthly B12 injections   Vitamin D, Ergocalciferol, (DRISDOL) 1.25 MG (50000 UNIT) CAPS capsule Take 1 capsule (50,000 Units total) by mouth every 7 (seven) days.   XTAMPZA ER 18 MG C12A Take 18 mg by mouth every 12 (twelve) hours.   No facility-administered encounter medications on file as of 11/25/2020.   Have you had any problems recently with your health? Patient states no problems at this time.  Have you had any problems with your pharmacy? Patient states no problems with pharmacy.   What issues or side effects are you having with your medications? Patient states no issues or side effects..   What would you like me to pass along to [facility] for them to help you with?  Patient states she needs to make an appointment to get her B12 injection she missed her August injection.   What can we do to take care of you better? Patient states nothing at this time, she is currently waiting on her surgery date for hip surgery.    Star Rating Drugs: Atorvastatin - filled last 11/22/20 [place], RMA Clinical Pharmacists Assistant [phone]  Time Spent: 2

## 2020-11-29 DIAGNOSIS — M545 Low back pain, unspecified: Secondary | ICD-10-CM | POA: Diagnosis not present

## 2020-11-29 DIAGNOSIS — M25552 Pain in left hip: Secondary | ICD-10-CM | POA: Diagnosis not present

## 2020-11-29 DIAGNOSIS — R262 Difficulty in walking, not elsewhere classified: Secondary | ICD-10-CM | POA: Diagnosis not present

## 2020-12-05 ENCOUNTER — Other Ambulatory Visit: Payer: Self-pay

## 2020-12-05 ENCOUNTER — Other Ambulatory Visit: Payer: Self-pay | Admitting: Internal Medicine

## 2020-12-05 ENCOUNTER — Encounter: Payer: Self-pay | Admitting: Podiatry

## 2020-12-05 ENCOUNTER — Ambulatory Visit (INDEPENDENT_AMBULATORY_CARE_PROVIDER_SITE_OTHER): Payer: Medicare Other | Admitting: Podiatry

## 2020-12-05 DIAGNOSIS — Q828 Other specified congenital malformations of skin: Secondary | ICD-10-CM

## 2020-12-05 DIAGNOSIS — M79671 Pain in right foot: Secondary | ICD-10-CM | POA: Diagnosis not present

## 2020-12-05 NOTE — Progress Notes (Signed)
This patient presents to the office to have her callus trimmed on her trght foot..    She says the callus is painful walking.  .  She presents for preventative foot care services. She says she has lost over 20 pounds and is going to have hip surgery.  Therefore she is walking less and less.   Vascular  Dorsalis pedis and posterior tibial pulses are palpable  B/L.  Capillary return  WNL.  Temperature gradient is  WNL.  Skin turgor  WNL  Sensorium  Senn Weinstein monofilament wire  WNL. Normal tactile sensation.  Nail Exam  Patient has normal nails with no evidence of bacterial or fungal infection.  Orthopedic  Exam  Muscle tone and muscle strength  WNL.  No limitations of motion feet  B/L.  No crepitus or joint effusion noted.  Foot type is unremarkable and digits show no abnormalities.  HAV 1st MPJ right foot.  Skin  No open lesions.  Normal skin texture and turgor. Porokeratosis sub 3 right foot.  Porokeratosis  Right foot.  Debride porokeratosis using a # 15 blade.  RTC 10 weeks .     Gardiner Barefoot DPM

## 2020-12-08 DIAGNOSIS — R262 Difficulty in walking, not elsewhere classified: Secondary | ICD-10-CM | POA: Diagnosis not present

## 2020-12-08 DIAGNOSIS — M25552 Pain in left hip: Secondary | ICD-10-CM | POA: Diagnosis not present

## 2020-12-08 DIAGNOSIS — M545 Low back pain, unspecified: Secondary | ICD-10-CM | POA: Diagnosis not present

## 2020-12-21 DIAGNOSIS — M160 Bilateral primary osteoarthritis of hip: Secondary | ICD-10-CM | POA: Diagnosis not present

## 2020-12-21 DIAGNOSIS — M25551 Pain in right hip: Secondary | ICD-10-CM | POA: Diagnosis not present

## 2020-12-21 DIAGNOSIS — G894 Chronic pain syndrome: Secondary | ICD-10-CM | POA: Diagnosis not present

## 2020-12-21 DIAGNOSIS — M4726 Other spondylosis with radiculopathy, lumbar region: Secondary | ICD-10-CM | POA: Diagnosis not present

## 2020-12-21 DIAGNOSIS — M5136 Other intervertebral disc degeneration, lumbar region: Secondary | ICD-10-CM | POA: Diagnosis not present

## 2020-12-25 NOTE — Progress Notes (Signed)
Subjective:    Patient ID: Misty [name], female    DOB: 02/24/1961, 60 y.o.   MRN: [id number]  This visit occurred during the SARS-CoV-2 public health emergency.  Safety protocols were in place, including screening questions prior to the visit, additional usage of staff PPE, and extensive cleaning of exam room while observing appropriate contact time as indicated for disinfecting solutions.    HPI The patient is here for an acute visit.   Here to discuss weight loss - she wants to lose weight - she needs to have hip surgery.  She will lose a little weight and then regains it.  She can not exercise much due to her hip pain.  Her pain doctor recommended that she take Ozempic.   Low  B12  - blood work in June showed B12 level of 229.   Vitamin d def -she is not taking any vitamin D on a regular basis-she states she ran out of the high-dose vitamin D.   Medications and allergies reviewed with patient and updated if appropriate.  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[place]) 07/08/2020   Myalgia 06/29/2020   Bilateral leg edema 06/29/2020   Surgical wound dehiscence 06/02/2020   Avascular necrosis of hip, left ([place]) 04/28/2020   Preoperative clearance 03/21/2020   Osteoarthritis of left hip 02/19/2020   COVID-19 virus infection 04/18/2019   Urinary frequency 03/10/2019   Restless leg syndrome 03/10/2019   Hyperlipidemia 11/27/2018   Anemia 11/25/2018   Nausea 09/17/2018   Right groin pain 07/16/2018   Depression 03/03/2018   Right shoulder pain 03/03/2018   Pancreatitis 02/22/2018   Alcohol abuse 02/22/2018   Cholelithiasis 02/22/2018   Adhesive capsulitis of right shoulder 01/20/2018   Ventral hernia 09/16/2017   Greater trochanteric bursitis of right hip 04/30/2017   Prediabetes 04/16/2017   Numbness 01/29/2017   Chronic low back pain 07/20/2016   Unilateral primary osteoarthritis, right hip 07/20/2016   Spinal stenosis of lumbar  region 11/29/2015   Positive urine drug screen 41/93/7902   Umbilical hernia 40/97/3532   Sleeping difficulties 07/15/2015   Bunion, right 06/20/2015   Muscle spasm of back 04/16/2015   B12 deficiency 03/30/2015   Hammer toe of left foot 02/16/2015   Fibrosis of skin of lower extremity 02/16/2015   Cervical spondylosis with radiculopathy 03/01/2014   Hammer toe of right foot 01/26/2014   Benign intracranial hypertension 06/18/2013   Cephalalgia 04/21/2013   Porokeratosis 03/31/2013   Vitamin D deficiency 04/18/2010   MEDIAL MENISCUS TEAR, LEFT 06/30/2008   BARRETTS ESOPHAGUS 06/28/2008   Knee osteoarthritis 06/28/2008   OBESITY 05/24/2008   HYPERTENSION, BENIGN ESSENTIAL 05/24/2008   Irritable bowel syndrome 05/24/2008   BARIATRIC SURGERY STATUS 03/19/2006   History of malignant neoplasm of large intestine 03/19/1997    Current Outpatient Medications on File Prior to Visit  Medication Sig Dispense Refill   albuterol (PROAIR HFA) 108 (90 Base) MCG/ACT inhaler Inhale 1-2 puffs into the lungs every 6 (six) hours as needed for wheezing or shortness of breath. 1 each 5   atorvastatin (LIPITOR) 20 MG tablet Take 1 tablet by mouth once daily 90 tablet 0   baclofen (LIORESAL) 20 MG tablet Take 20 mg by mouth daily as needed.     cyanocobalamin (,VITAMIN B-12,) 1000 MCG/ML injection Inject 1,000 mcg into the muscle every 30 (thirty) days.     diclofenac Sodium (VOLTAREN) 1 % GEL APPLY 2 GRAMS TOPICALLY 4 TIMES DAILY (Patient  taking differently: Apply 2 g topically 4 (four) times daily as needed (pain).) 100 g 0   docusate sodium (COLACE) 100 MG capsule Take 1 capsule (100 mg total) by mouth 2 (two) times daily as needed for mild constipation. (Patient taking differently: Take 200 mg by mouth daily.) 60 capsule 5   furosemide (LASIX) 40 MG tablet Take 1 tablet (40 mg total) by mouth daily as needed for fluid. 20 tablet 0   gabapentin (NEURONTIN) 300 MG capsule Take 3 capsules (900 mg total)  by mouth 3 (three) times daily. 270 capsule 5   lisinopril (ZESTRIL) 20 MG tablet Take 1 tablet by mouth once daily 90 tablet 0   meloxicam (MOBIC) 15 MG tablet Take 1 tablet by mouth once daily with food 90 tablet 0   naloxone (NARCAN) nasal spray 4 mg/0.1 mL Place 1 spray into the nose as needed (opioid overdose).     Oxycodone HCl 10 MG TABS Take 10 mg by mouth 4 (four) times daily as needed for pain.     potassium chloride (KLOR-CON) 10 MEQ tablet Take 2 tablets (20 mEq total) by mouth daily as needed (lasix with lasix). 30 tablet 0   Syringe/Needle, Disp, (SYRINGE 3CC/25GX1") 25G X 1" 3 ML MISC Use for monthly B12 injections 3 each 3   XTAMPZA ER 18 MG C12A Take 18 mg by mouth every 12 (twelve) hours.     cyclobenzaprine (FLEXERIL) 5 MG tablet Take 1 tablet (5 mg total) by mouth at bedtime. (Patient not taking: Reported on 12/26/2020) 30 tablet 5   No current facility-administered medications on file prior to visit.    Past Medical History:  Diagnosis Date   Allergy    Arthritis    Back pain    Colon cancer ([place])    Depression    Diarrhea    GERD (gastroesophageal reflux disease)    Hypertension    Insomnia    MS (multiple sclerosis) ([place]) 2000   pt says that she doesn't have it   Other hammer toe (acquired) 03/31/2013   Pneumonia    hx of   Pre-diabetes    Umbilical hernia     Past Surgical History:  Procedure Laterality Date   ABDOMINAL HYSTERECTOMY     ANTERIOR CERVICAL DECOMP/DISCECTOMY FUSION N/A 03/01/2014   Procedure: ANTERIOR CERVICAL DECOMPRESSION/DISCECTOMY FUSION 1 LEVEL;  Surgeon: Charlie Pitter, MD;  Location: [facility];  Service: Neurosurgery;  Laterality: N/A;  ANTERIOR CERVICAL DECOMPRESSION/DISCECTOMY FUSION 1 LEVEL   BUNIONECTOMY Right 10/14/2014   @[facility]    CESAREAN SECTION     COLON SURGERY     COLONOSCOPY     FOOT SURGERY Right    GASTRIC BYPASS     Hammer Toe Repair Right 10/14/2014   RT #2, @[facility]    INCISION AND DRAINAGE HIP Left 06/02/2020    Procedure: IRRIGATION AND DEBRIDEMENT LEFT HIP, POSSIBLE HEAD BALL AND LINER EXCHANGE;  Surgeon: Rod Can, MD;  Location: [facility];  Service: Orthopedics;  Laterality: Left;  72min   TENOTOMY Right 10/14/2014   RT #3, @[facility]    TOTAL HIP ARTHROPLASTY Left 04/28/2020   Procedure: TOTAL HIP ARTHROPLASTY ANTERIOR APPROACH;  Surgeon: Rod Can, MD;  Location: [facility];  Service: Orthopedics;  Laterality: Left;  3E bed    Social History   Socioeconomic History   Marital status: Single    Spouse name: Not on file   Number of children: 2   Years of education: Not on file   Highest education level: Not  on file  Occupational History   Occupation: Disability  Tobacco Use   Smoking status: Former    Packs/day: 0.25    Years: 20.00    Pack years: 5.00    Types: Cigarettes    Quit date: 09/22/2018    Years since quitting: 2.2   Smokeless tobacco: Never   Tobacco comments:    states she quit 3 months ago  Vaping Use   Vaping Use: Never used  Substance and Sexual Activity   Alcohol use: Yes    Alcohol/week: 0.0 standard drinks    Comment: social   Drug use: No   Sexual activity: Yes    Partners: Male  Other Topics Concern   Not on file  Social History Narrative   Not on file   Social Determinants of Health   Financial Resource Strain: Not on file  Food Insecurity: Not on file  Transportation Needs: Not on file  Physical Activity: Not on file  Stress: Not on file  Social Connections: Not on file    Family History  Problem Relation Age of Onset   Stroke Mother    Hypertension Mother    Hyperlipidemia Mother    Diabetes Mother    Diabetes Brother    Hypertension Brother    Hypertension Brother    Hypertension Brother    Hypertension Brother    Hypertension Brother    Alcoholism Brother     Review of Systems     Objective:   Vitals:   12/26/20 1029  BP: 126/74  Pulse: 87  Temp: 97.9 F (36.6 C)  SpO2: 99%   BP Readings from Last 3 Encounters:  12/26/20  126/74  09/01/20 128/84  07/18/20 128/72   Wt Readings from Last 3 Encounters:  12/26/20 257 lb (116.6 kg)  09/01/20 261 lb 12.8 oz (118.8 kg)  07/18/20 251 lb (113.9 kg)   Body mass index is 44.11 kg/m.   Physical Exam         Assessment & Plan:    See Problem List for Assessment and Plan of chronic medical problems.

## 2020-12-26 ENCOUNTER — Telehealth: Payer: Self-pay

## 2020-12-26 ENCOUNTER — Ambulatory Visit (INDEPENDENT_AMBULATORY_CARE_PROVIDER_SITE_OTHER): Payer: Medicare Other | Admitting: Internal Medicine

## 2020-12-26 ENCOUNTER — Other Ambulatory Visit: Payer: Self-pay

## 2020-12-26 ENCOUNTER — Encounter: Payer: Self-pay | Admitting: Internal Medicine

## 2020-12-26 VITALS — BP 126/74 | HR 87 | Temp 97.9°F | Ht 64.0 in | Wt 257.0 lb

## 2020-12-26 DIAGNOSIS — E538 Deficiency of other specified B group vitamins: Secondary | ICD-10-CM | POA: Diagnosis not present

## 2020-12-26 DIAGNOSIS — E559 Vitamin D deficiency, unspecified: Secondary | ICD-10-CM

## 2020-12-26 DIAGNOSIS — Z6841 Body Mass Index (BMI) 40.0 and over, adult: Secondary | ICD-10-CM | POA: Diagnosis not present

## 2020-12-26 DIAGNOSIS — Z23 Encounter for immunization: Secondary | ICD-10-CM | POA: Diagnosis not present

## 2020-12-26 MED ORDER — OZEMPIC (0.25 OR 0.5 MG/DOSE) 2 MG/1.5ML ~~LOC~~ SOPN: PEN_INJECTOR | SUBCUTANEOUS | 0 refills | Status: DC

## 2020-12-26 MED ORDER — ONDANSETRON HCL 4 MG PO TABS: 4.0000 mg | ORAL_TABLET | Freq: Four times a day (QID) | ORAL | 0 refills | Status: DC | PRN

## 2020-12-26 MED ORDER — VITAMIN D3 50 MCG (2000 UT) PO CAPS: 2000.0000 [IU] | ORAL_CAPSULE | Freq: Every day | ORAL | 3 refills | Status: DC

## 2020-12-26 NOTE — Patient Instructions (Addendum)
  Flu immunization administered today.   B12 injection today.    Medications changes include :   ozempic 0.25 mg weekly for 1 month then increase to 0.5 mg weekly   Your prescription(s) have been submitted to your pharmacy. Please take as directed and contact our office if you believe you are having problem(s) with the medication(s).

## 2020-12-26 NOTE — Assessment & Plan Note (Signed)
Chronic She is here today because she desperately needs to lose weight so that she can have her hip replaced-the pain is limiting her significantly physically and the pain is not controlled She has been eating very well recently.  She states at 1 point she was very close to being at her goal weight, but then gained weight Discussed options We can see if Ozempic is covered-she is a prediabetic.  Discussed side effects.  Discussed increased risk of nausea and heartburn and she may not tolerate this Start Ozempic 0.25 mg weekly x4 weeks, then 0.5 mg weekly and eventually 1 mg weekly if tolerated

## 2020-12-26 NOTE — Assessment & Plan Note (Signed)
Chronic Recently took high-dose vitamin D, but did not start daily vitamin D-advised she needs to start taking a daily vitamin D Will send vitamin D 2000 units to pharmacy-take 1 pill daily.  Discussed this may not be covered and she may need to get this OTC

## 2020-12-26 NOTE — Assessment & Plan Note (Signed)
Chronic Likely secondary to gastric bypass surgery Continue monthly B12 injections B12 injection today

## 2020-12-26 NOTE — Progress Notes (Signed)
Chronic Care Management Pharmacy Assistant   Name: DELOYCE WALTHERS  MRN: [id number] DOB: [birth year]  Reason for Encounter: Disease State - Hypertension  Recent office visits:  12/26/20 Burns (PCP) - B12 deficiency. Start Vit. D3 2,000u & Ozempic 0.25 mg weekly for 1 month then increase to 0.5 mg weekly. D/c Vit.d 50,000.  Flu injection.  Recent consult visits:  12/05/20 Rosana Hoes (PCP) - Porokeratosis. No med changes.  Hospital visits:  None in previous 6 months  Medications: Outpatient Encounter Medications as of 12/26/2020  Medication Sig   albuterol (PROAIR HFA) 108 (90 Base) MCG/ACT inhaler Inhale 1-2 puffs into the lungs every 6 (six) hours as needed for wheezing or shortness of breath.   atorvastatin (LIPITOR) 20 MG tablet Take 1 tablet by mouth once daily   baclofen (LIORESAL) 20 MG tablet Take 20 mg by mouth daily as needed.   Cholecalciferol (VITAMIN D3) 50 MCG (2000 UT) capsule Take 1 capsule (2,000 Units total) by mouth daily.   cyanocobalamin (,VITAMIN B-12,) 1000 MCG/ML injection Inject 1,000 mcg into the muscle every 30 (thirty) days.   cyclobenzaprine (FLEXERIL) 5 MG tablet Take 1 tablet (5 mg total) by mouth at bedtime. (Patient not taking: Reported on 12/26/2020)   diclofenac Sodium (VOLTAREN) 1 % GEL APPLY 2 GRAMS TOPICALLY 4 TIMES DAILY (Patient taking differently: Apply 2 g topically 4 (four) times daily as needed (pain).)   docusate sodium (COLACE) 100 MG capsule Take 1 capsule (100 mg total) by mouth 2 (two) times daily as needed for mild constipation. (Patient taking differently: Take 200 mg by mouth daily.)   furosemide (LASIX) 40 MG tablet Take 1 tablet (40 mg total) by mouth daily as needed for fluid.   gabapentin (NEURONTIN) 300 MG capsule Take 3 capsules (900 mg total) by mouth 3 (three) times daily.   lisinopril (ZESTRIL) 20 MG tablet Take 1 tablet by mouth once daily   meloxicam (MOBIC) 15 MG tablet Take 1 tablet by mouth once daily with food   naloxone  (NARCAN) nasal spray 4 mg/0.1 mL Place 1 spray into the nose as needed (opioid overdose).   ondansetron (ZOFRAN) 4 MG tablet Take 1 tablet (4 mg total) by mouth every 6 (six) hours as needed. for nausea   Oxycodone HCl 10 MG TABS Take 10 mg by mouth 4 (four) times daily as needed for pain.   potassium chloride (KLOR-CON) 10 MEQ tablet Take 2 tablets (20 mEq total) by mouth daily as needed (lasix with lasix).   Semaglutide,0.25 or 0.5MG /DOS, (OZEMPIC, 0.25 OR 0.5 MG/DOSE,) 2 MG/1.5ML SOPN Inject 0.25 mg into the skin once a week for 30 days, THEN 0.5 mg once a week.   Syringe/Needle, Disp, (SYRINGE 3CC/25GX1") 25G X 1" 3 ML MISC Use for monthly B12 injections   XTAMPZA ER 18 MG C12A Take 18 mg by mouth every 12 (twelve) hours.   No facility-administered encounter medications on file as of 12/26/2020.    Reviewed chart prior to disease state call. Spoke with patient regarding BP  Recent Office Vitals: BP Readings from Last 3 Encounters:  12/26/20 126/74  09/01/20 128/84  07/18/20 128/72   Pulse Readings from Last 3 Encounters:  12/26/20 87  09/01/20 98  07/18/20 98    Wt Readings from Last 3 Encounters:  12/26/20 257 lb (116.6 kg)  09/01/20 261 lb 12.8 oz (118.8 kg)  07/18/20 251 lb (113.9 kg)     Kidney Function Lab Results  Component Value Date/Time   CREATININE 0.88 09/01/2020  03:45 PM   CREATININE 0.96 06/29/2020 04:25 PM   CREATININE 0.51 08/21/2013 01:37 PM   CREATININE 0.49 (L) 08/03/2013 11:27 AM   CREATININE 0.49 (L) 08/03/2013 11:27 AM   GFR 71.95 09/01/2020 03:45 PM   GFRNONAA >60 06/03/2020 03:18 AM   GFRNONAA >89 08/03/2013 11:27 AM   GFRAA >60 02/26/2018 05:25 AM   GFRAA >89 08/03/2013 11:27 AM    BMP Latest Ref Rng & Units 09/01/2020 06/29/2020 06/03/2020  Glucose 70 - 99 mg/dL 90 81 168(H)  BUN 6 - 23 mg/dL 24(H) 19 21(H)  Creatinine 0.40 - 1.20 mg/dL 0.88 0.96 0.93  Sodium 135 - 145 mEq/L 138 139 137  Potassium 3.5 - 5.1 mEq/L 4.8 4.3 4.6  Chloride 96 -  112 mEq/L 103 105 106  CO2 19 - 32 mEq/L 27 26 22   Calcium 8.4 - 10.5 mg/dL 9.8 9.4 8.7(L)    Current antihypertensive regimen:  Lisinopril 20 mg daily HCTZ 12.5 mg daily as needed for swelling or BP  How often are you checking your Blood Pressure?  daily  Current home BP readings:  Patient states her readings has been within normal limits.  What recent interventions/DTPs have been made by any provider to improve Blood Pressure control since last CPP Visit:   None listed  Any recent hospitalizations or ED visits since last visit with CPP?  No  What diet changes have been made to improve Blood Pressure Control?  Patient states no new diet changes.  What exercise is being done to improve your Blood Pressure Control?  Patient states she's in pain a lot and tries her best to attempt PT.  Adherence Review: Is the patient currently on ACE/ARB medication? No Does the patient have >5 day gap between last estimated fill dates? No   Star Rating Drugs: Atorvastatin - filled 11/22/20 90D Ozempic - filled 12/26/20 30D Lisinopril - filled 09/20/20 90D  Patient states Lisinopril has been refilled on 01/02/21.  Care Gaps Colonoscopy - last 09/09/14 Diabetic Foot Exam - NA Mammogram - last 01/16/18 Ophthalmology - NA Dexa Scan - NA Annual Well Visit - NA Micro albumin - NA Hemoglobin A1c -  5.3 - 06/01/20  Orinda Kenner, [place] Clinical Pharmacists Assistant [phone]

## 2020-12-30 NOTE — Addendum Note (Signed)
Addended by: Marcina Millard on: 12/30/2020 02:22 PM   Modules accepted: Orders

## 2021-01-02 ENCOUNTER — Other Ambulatory Visit: Payer: Self-pay | Admitting: Internal Medicine

## 2021-01-05 DIAGNOSIS — H25013 Cortical age-related cataract, bilateral: Secondary | ICD-10-CM | POA: Diagnosis not present

## 2021-01-05 DIAGNOSIS — H35363 Drusen (degenerative) of macula, bilateral: Secondary | ICD-10-CM | POA: Diagnosis not present

## 2021-01-05 DIAGNOSIS — H2513 Age-related nuclear cataract, bilateral: Secondary | ICD-10-CM | POA: Diagnosis not present

## 2021-01-05 DIAGNOSIS — H35033 Hypertensive retinopathy, bilateral: Secondary | ICD-10-CM | POA: Diagnosis not present

## 2021-01-09 DIAGNOSIS — M1611 Unilateral primary osteoarthritis, right hip: Secondary | ICD-10-CM | POA: Diagnosis not present

## 2021-01-11 ENCOUNTER — Telehealth: Payer: Self-pay | Admitting: Internal Medicine

## 2021-01-11 NOTE — Telephone Encounter (Signed)
Patient calling to follow-up on clearance form for [facility]  Form is in e-fax folder  Patient would like a call when the form has been faxed to [facility] so that she can follow-up with them  Please call her at [phone]

## 2021-01-11 NOTE — Telephone Encounter (Signed)
Spoke with patient today. 

## 2021-01-12 NOTE — Progress Notes (Signed)
Please enter orders for surgery 01-25-21

## 2021-01-13 ENCOUNTER — Ambulatory Visit: Payer: Self-pay | Admitting: Student

## 2021-01-13 NOTE — Patient Instructions (Addendum)
DUE TO COVID-19 ONLY ONE VISITOR IS ALLOWED TO COME WITH YOU AND STAY IN THE WAITING ROOM ONLY DURING PRE OP AND PROCEDURE.   **NO VISITORS ARE ALLOWED IN THE SHORT STAY AREA OR RECOVERY ROOM!!**  IF YOU WILL BE ADMITTED INTO THE HOSPITAL YOU ARE ALLOWED ONLY TWO SUPPORT PEOPLE DURING VISITATION HOURS ONLY (7 AM -8PM)    Up to two visitors ages 72+ are allowed at one time in a patient's room.  The visitors may rotate out with other people throughout the day.  Additionally, up to two children between the ages of 71 and 15 are allowed and do not count toward the number of allowed visitors.  Children within this age range must be accompanied by an adult visitor.  One adult visitor may remain with the patient overnight and must be in the room by 8 PM.  COVID SWAB TESTING MUST BE COMPLETED ON:  Monday, 01-23-21,  Between the hours of 8 and 3  **MUST PRESENT COMPLETED FORM AT TESTING SITE**    [place] (backside of the building)  You are not required to quarantine, however you are required to wear a well-fitted mask when you are out and around people not in your household.  Hand Hygiene often Do NOT share personal items Notify your provider if you are in close contact with someone who has COVID or you develop fever 100.4 or greater, new onset of sneezing, cough, sore throat, shortness of breath or body aches.        Your procedure is scheduled on:  Wednesday, 01-25-21   Report to [facility] Main  Entrance     Report to admitting at 5:45 AM   Call this number if you have problems the morning of surgery [phone]   Do not eat food :After Midnight.   May have liquids until 5:30 AM  day of surgery  CLEAR LIQUID DIET  Foods Allowed                                                                     Foods Excluded  Water, Black Coffee (no milk/no creamer) and tea, regular and decaf                              liquids that you cannot  Plain Jell-O in any  flavor  (No red)                         see through such as: Fruit ices (not with fruit pulp)                                 milk, soups, orange juice  Iced Popsicles (No red)                                    All solid food  Apple juices Sports drinks like Gatorade (No red) Lightly seasoned clear broth or consume(fat free) Sugar   Complete one G2 drink the morning of surgery at  5:30 AM  the day of surgery.       The day of surgery:  Drink ONE (1) Pre-Surgery Clear G2 by am the morning of surgery. Drink in one sitting. Do not sip.  This drink was given to you during your hospital  pre-op appointment visit. Nothing else to drink after completing the Pre-Surgery G2.          If you have questions, please contact your surgeon's office.     Oral Hygiene is also important to reduce your risk of infection.                                    Remember - BRUSH YOUR TEETH THE MORNING OF SURGERY WITH YOUR REGULAR TOOTHPASTE   Do NOT smoke after Midnight   Take these medicines the morning of surgery with A SIP OF WATER:  Atorvastatin, Gabapentin.  Oxycodone and Ondansetron if needed  DO NOT TAKE ANY ORAL DIABETIC MEDICATIONS DAY OF YOUR SURGERY                    Stop all vitamins and herbal supplements a week before surgery             You may not have any metal on your body including hair pins, jewelry, and body piercing             Do not wear make-up, lotions, powders, perfumes or deodorant  Do not wear nail polish including gel and S&S, artificial/acrylic nails, or any other type of covering on natural nails including finger and toenails. If you have artificial nails, gel coating, etc. that needs to be removed by a nail salon please have this removed prior to surgery or surgery may need to be canceled/ delayed if the surgeon/ anesthesia feels like they are unable to be safely monitored.   Do not shave  48 hours prior to surgery.          Do not bring  valuables to the hospital. [place].   Contacts, dentures or bridgework may not be worn into surgery.   Bring small overnight bag day of surgery.   Please read over the following fact sheets you were given: IF YOU HAVE QUESTIONS ABOUT YOUR PRE OP INSTRUCTIONS PLEASE CALL [facility] - Preparing for Surgery Before surgery, you can play an important role.  Because skin is not sterile, your skin needs to be as free of germs as possible.  You can reduce the number of germs on your skin by washing with CHG (chlorahexidine gluconate) soap before surgery.  CHG is an antiseptic cleaner which kills germs and bonds with the skin to continue killing germs even after washing. Please DO NOT use if you have an allergy to CHG or antibacterial soaps.  If your skin becomes reddened/irritated stop using the CHG and inform your nurse when you arrive at Short Stay. Do not shave (including legs and underarms) for at least 48 hours prior to the first CHG shower.  You may shave your face/neck.  Please follow these instructions carefully:  1.  Shower with CHG Soap the night before surgery and the  morning of surgery.  2.  If  you choose to wash your hair, wash your hair first as usual with your normal  shampoo.  3.  After you shampoo, rinse your hair and body thoroughly to remove the shampoo.                             4.  Use CHG as you would any other liquid soap.  You can apply chg directly to the skin and wash.  Gently with a scrungie or clean washcloth.  5.  Apply the CHG Soap to your body ONLY FROM THE NECK DOWN.   Do   not use on face/ open                           Wound or open sores. Avoid contact with eyes, ears mouth and   genitals (private parts).                       Wash face,  Genitals (private parts) with your normal soap.             6.  Wash thoroughly, paying special attention to the area where your    surgery  will be performed.  7.   Thoroughly rinse your body with warm water from the neck down.  8.  DO NOT shower/wash with your normal soap after using and rinsing off the CHG Soap.                9.  Pat yourself dry with a clean towel.            10.  Wear clean pajamas.            11.  Place clean sheets on your bed the night of your first shower and do not  sleep with pets. Day of Surgery : Do not apply any lotions/deodorants the morning of surgery.  Please wear clean clothes to the hospital/surgery center.  FAILURE TO FOLLOW THESE INSTRUCTIONS MAY RESULT IN THE CANCELLATION OF YOUR SURGERY  PATIENT SIGNATURE_________________________________  NURSE SIGNATURE__________________________________  ________________________________________________________________________   Misty [name]  An incentive spirometer is a tool that can help keep your lungs clear and active. This tool measures how well you are filling your lungs with each breath. Taking long deep breaths may help reverse or decrease the chance of developing breathing (pulmonary) problems (especially infection) following: A long period of time when you are unable to move or be active. BEFORE THE PROCEDURE  If the spirometer includes an indicator to show your best effort, your nurse or respiratory therapist will set it to a desired goal. If possible, sit up straight or lean slightly forward. Try not to slouch. Hold the incentive spirometer in an upright position. INSTRUCTIONS FOR USE  Sit on the edge of your bed if possible, or sit up as far as you can in bed or on a chair. Hold the incentive spirometer in an upright position. Breathe out normally. Place the mouthpiece in your mouth and seal your lips tightly around it. Breathe in slowly and as deeply as possible, raising the piston or the ball toward the top of the column. Hold your breath for 3-5 seconds or for as long as possible. Allow the piston or ball to fall to the bottom of the column. Remove  the mouthpiece from your mouth and breathe out normally. Rest for a few seconds and repeat Steps  1 through 7 at least 10 times every 1-2 hours when you are awake. Take your time and take a few normal breaths between deep breaths. The spirometer may include an indicator to show your best effort. Use the indicator as a goal to work toward during each repetition. After each set of 10 deep breaths, practice coughing to be sure your lungs are clear. If you have an incision (the cut made at the time of surgery), support your incision when coughing by placing a pillow or rolled up towels firmly against it. Once you are able to get out of bed, walk around indoors and cough well. You may stop using the incentive spirometer when instructed by your caregiver.  RISKS AND COMPLICATIONS Take your time so you do not get dizzy or light-headed. If you are in pain, you may need to take or ask for pain medication before doing incentive spirometry. It is harder to take a deep breath if you are having pain. AFTER USE Rest and breathe slowly and easily. It can be helpful to keep track of a log of your progress. Your caregiver can provide you with a simple table to help with this. If you are using the spirometer at home, follow these instructions: [place] IF:  You are having difficultly using the spirometer. You have trouble using the spirometer as often as instructed. Your pain medication is not giving enough relief while using the spirometer. You develop fever of 100.5 F (38.1 C) or higher. SEEK IMMEDIATE MEDICAL CARE IF:  You cough up bloody sputum that had not been present before. You develop fever of 102 F (38.9 C) or greater. You develop worsening pain at or near the incision site. MAKE SURE YOU:  Understand these instructions. Will watch your condition. Will get help right away if you are not doing well or get worse. Document Released: 07/16/2006 Document Revised: 05/28/2011 Document Reviewed:  09/16/2006 Exit[employer] Patient Information 2014 Exit[employer], [place].   ________________________________________________________________________  WHAT IS A BLOOD TRANSFUSION? Blood Transfusion Information  A transfusion is the replacement of blood or some of its parts. Blood is made up of multiple cells which provide different functions. Red blood cells carry oxygen and are used for blood loss replacement. White blood cells fight against infection. Platelets control bleeding. Plasma helps clot blood. Other blood products are available for specialized needs, such as hemophilia or other clotting disorders. BEFORE THE TRANSFUSION  Who gives blood for transfusions?  Healthy volunteers who are fully evaluated to make sure their blood is safe. This is blood bank blood. Transfusion therapy is the safest it has ever been in the practice of medicine. Before blood is taken from a donor, a complete history is taken to make sure that person has no history of diseases nor engages in risky social behavior (examples are intravenous drug use or sexual activity with multiple partners). The donor's travel history is screened to minimize risk of transmitting infections, such as malaria. The donated blood is tested for signs of infectious diseases, such as HIV and hepatitis. The blood is then tested to be sure it is compatible with you in order to minimize the chance of a transfusion reaction. If you or a relative donates blood, this is often done in anticipation of surgery and is not appropriate for emergency situations. It takes many days to process the donated blood. RISKS AND COMPLICATIONS Although transfusion therapy is very safe and saves many lives, the main dangers of transfusion include:  Getting an infectious disease.  Developing a transfusion reaction. This is an allergic reaction to something in the blood you were given. Every precaution is taken to prevent this. The decision to have a blood transfusion has been  considered carefully by your caregiver before blood is given. Blood is not given unless the benefits outweigh the risks. AFTER THE TRANSFUSION Right after receiving a blood transfusion, you will usually feel much better and more energetic. This is especially true if your red blood cells have gotten low (anemic). The transfusion raises the level of the red blood cells which carry oxygen, and this usually causes an energy increase. The nurse administering the transfusion will monitor you carefully for complications. HOME CARE INSTRUCTIONS  No special instructions are needed after a transfusion. You may find your energy is better. Speak with your caregiver about any limitations on activity for underlying diseases you may have. SEEK MEDICAL CARE IF:  Your condition is not improving after your transfusion. You develop redness or irritation at the intravenous (IV) site. SEEK IMMEDIATE MEDICAL CARE IF:  Any of the following symptoms occur over the next 12 hours: Shaking chills. You have a temperature by mouth above 102 F (38.9 C), not controlled by medicine. Chest, back, or muscle pain. People around you feel you are not acting correctly or are confused. Shortness of breath or difficulty breathing. Dizziness and fainting. You get a rash or develop hives. You have a decrease in urine output. Your urine turns a dark color or changes to pink, red, or brown. Any of the following symptoms occur over the next 10 days: You have a temperature by mouth above 102 F (38.9 C), not controlled by medicine. Shortness of breath. Weakness after normal activity. The white part of the eye turns yellow (jaundice). You have a decrease in the amount of urine or are urinating less often. Your urine turns a dark color or changes to pink, red, or brown. Document Released: 03/02/2000 Document Revised: 05/28/2011 Document Reviewed: 10/20/2007 [facility] Patient Information 2014 [place],  [place].  _______________________________________________________________________

## 2021-01-13 NOTE — Progress Notes (Addendum)
COVID swab appointment:  01-23-21  COVID Vaccine Completed:  Yes x2 Date COVID Vaccine completed: Has received booster:  Yes x2 COVID vaccine manufacturer: [employer]   Date of COVID positive in last 90 days: No  PCP - Billey Gosling, MD Cardiologist - N/A  Chest x-ray - No EKG - 03-21-20 Epic Stress Test - N/A ECHO - N/A Cardiac Cath - N/A Pacemaker/ICD device last checked: Spinal Cord Stimulator:  Sleep Study - N/A CPAP -   Fasting Blood Sugar - Prediabetic, does not check Checks Blood Sugar _____ times a day  Blood Thinner Instructions: Aspirin Instructions: ASA 81 mg.    Last Dose:  Stopped last 01-13-21  Activity level:  Unable to go up steps due to hip pain.  Able to pererform activities of daily living without stopping and without symptoms of chest pain or shortness of breath.    Anesthesia review:  N/A  Patient denies shortness of breath, fever, cough and chest pain at PAT appointment   Patient verbalized understanding of instructions that were given to them at the PAT appointment. Patient was also instructed that they will need to review over the PAT instructions again at home before surgery.

## 2021-01-13 NOTE — H&P (View-Only) (Signed)
TOTAL HIP ADMISSION H&P  Patient is admitted for right total hip arthroplasty.  Subjective:  Chief Complaint: right hip pain  HPI: Misty [name], 60 y.o. female, has a history of pain and functional disability in the right hip(s) due to arthritis and patient has failed non-surgical conservative treatments for greater than 12 weeks to include NSAID's and/or analgesics and activity modification.  Onset of symptoms was gradual starting 3 years ago with gradually worsening course since that time.The patient noted no past surgery on the right hip(s).  Patient currently rates pain in the right hip at 9 out of 10 with activity. Patient has worsening of pain with activity and weight bearing, pain that interfers with activities of daily living, and pain with passive range of motion. Patient has evidence of subchondral cysts, subchondral sclerosis, and joint space narrowing by imaging studies. This condition presents safety issues increasing the risk of falls. There is no current active infection.  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   Myalgia 06/29/2020   Bilateral leg edema 06/29/2020   Surgical wound dehiscence 06/02/2020   Avascular necrosis of hip, left ([place]) 04/28/2020   Preoperative clearance 03/21/2020   Osteoarthritis of left hip 02/19/2020   COVID-19 virus infection 04/18/2019   Urinary frequency 03/10/2019   Restless leg syndrome 03/10/2019   Hyperlipidemia 11/27/2018   Anemia 11/25/2018   Nausea 09/17/2018   Right groin pain 07/16/2018   Depression 03/03/2018   Right shoulder pain 03/03/2018   Pancreatitis 02/22/2018   Alcohol abuse 02/22/2018   Cholelithiasis 02/22/2018   Adhesive capsulitis of right shoulder 01/20/2018   Ventral hernia 09/16/2017   Greater trochanteric bursitis of right hip 04/30/2017   Prediabetes 04/16/2017   Numbness 01/29/2017   Chronic low back pain 07/20/2016   Unilateral primary osteoarthritis,  right hip 07/20/2016   Spinal stenosis of lumbar region 11/29/2015   Positive urine drug screen 99/35/7017   Umbilical hernia 79/39/0300   Sleeping difficulties 07/15/2015   Bunion, right 06/20/2015   Muscle spasm of back 04/16/2015   B12 deficiency 03/30/2015   Hammer toe of left foot 02/16/2015   Fibrosis of skin of lower extremity 02/16/2015   Cervical spondylosis with radiculopathy 03/01/2014   Hammer toe of right foot 01/26/2014   Benign intracranial hypertension 06/18/2013   Cephalalgia 04/21/2013   Porokeratosis 03/31/2013   Vitamin D deficiency 04/18/2010   MEDIAL MENISCUS TEAR, LEFT 06/30/2008   BARRETTS ESOPHAGUS 06/28/2008   Knee osteoarthritis 06/28/2008   Morbid obesity with BMI of 40.0-44.9, adult ([place]) 05/24/2008   HYPERTENSION, BENIGN ESSENTIAL 05/24/2008   Irritable bowel syndrome 05/24/2008   BARIATRIC SURGERY STATUS 03/19/2006   History of malignant neoplasm of large intestine 03/19/1997   Past Medical History:  Diagnosis Date   Allergy    Arthritis    Back pain    Colon cancer ([place])    Depression    Diarrhea    GERD (gastroesophageal reflux disease)    Hypertension    Insomnia    MS (multiple sclerosis) ([place]) 2000   pt says that she doesn't have it   Other hammer toe (acquired) 03/31/2013   Pneumonia    hx of   Pre-diabetes    Umbilical hernia     Past Surgical History:  Procedure Laterality Date   ABDOMINAL HYSTERECTOMY     ANTERIOR CERVICAL DECOMP/DISCECTOMY FUSION N/A 03/01/2014   Procedure: ANTERIOR CERVICAL DECOMPRESSION/DISCECTOMY FUSION 1 LEVEL;  Surgeon: Charlie Pitter, MD;  Location:  [facility];  Service: Neurosurgery;  Laterality: N/A;  ANTERIOR CERVICAL DECOMPRESSION/DISCECTOMY FUSION 1 LEVEL   BUNIONECTOMY Right 10/14/2014   @[facility]    CESAREAN SECTION     COLON SURGERY     COLONOSCOPY     FOOT SURGERY Right    GASTRIC BYPASS     Hammer Toe Repair Right 10/14/2014   RT #2, @[facility]    INCISION AND DRAINAGE HIP Left 06/02/2020   Procedure:  IRRIGATION AND DEBRIDEMENT LEFT HIP, POSSIBLE HEAD BALL AND LINER EXCHANGE;  Surgeon: Rod Can, MD;  Location: [facility];  Service: Orthopedics;  Laterality: Left;  84min   TENOTOMY Right 10/14/2014   RT #3, @[facility]    TOTAL HIP ARTHROPLASTY Left 04/28/2020   Procedure: TOTAL HIP ARTHROPLASTY ANTERIOR APPROACH;  Surgeon: Rod Can, MD;  Location: [facility];  Service: Orthopedics;  Laterality: Left;  3E bed    Current Outpatient Medications  Medication Sig Dispense Refill Last Dose   acetaminophen (TYLENOL) 500 MG tablet Take 500 mg by mouth every 6 (six) hours as needed for mild pain.      albuterol (PROAIR HFA) 108 (90 Base) MCG/ACT inhaler Inhale 1-2 puffs into the lungs every 6 (six) hours as needed for wheezing or shortness of breath. 1 each 5    aspirin EC 81 MG tablet Take 81 mg by mouth daily. Swallow whole.      atorvastatin (LIPITOR) 20 MG tablet Take 1 tablet by mouth once daily 90 tablet 0    Cholecalciferol (VITAMIN D3) 50 MCG (2000 UT) capsule Take 1 capsule (2,000 Units total) by mouth daily. 90 capsule 3    cyanocobalamin (,VITAMIN B-12,) 1000 MCG/ML injection Inject 1,000 mcg into the muscle every 30 (thirty) days.      diclofenac Sodium (VOLTAREN) 1 % GEL APPLY 2 GRAMS TOPICALLY 4 TIMES DAILY (Patient taking differently: Apply 2 g topically 4 (four) times daily as needed (pain).) 100 g 0    docusate sodium (COLACE) 100 MG capsule Take 1 capsule (100 mg total) by mouth 2 (two) times daily as needed for mild constipation. (Patient taking differently: Take 200 mg by mouth daily.) 60 capsule 5    furosemide (LASIX) 40 MG tablet Take 1 tablet (40 mg total) by mouth daily as needed for fluid. 20 tablet 0    gabapentin (NEURONTIN) 300 MG capsule TAKE 3 CAPSULES BY MOUTH THREE TIMES DAILY 270 capsule 0    lisinopril (ZESTRIL) 20 MG tablet Take 1 tablet by mouth once daily 90 tablet 0    meloxicam (MOBIC) 15 MG tablet Take 1 tablet by mouth once daily with food 90 tablet 0    naloxone  (NARCAN) nasal spray 4 mg/0.1 mL Place 1 spray into the nose as needed (opioid overdose).      ondansetron (ZOFRAN) 4 MG tablet Take 1 tablet (4 mg total) by mouth every 6 (six) hours as needed. for nausea 20 tablet 0    Oxycodone HCl 10 MG TABS Take 10 mg by mouth 4 (four) times daily as needed (severe pain).      potassium chloride (KLOR-CON) 10 MEQ tablet Take 2 tablets (20 mEq total) by mouth daily as needed (lasix with lasix). 30 tablet 0    Semaglutide,0.25 or 0.5MG /DOS, (OZEMPIC, 0.25 OR 0.5 MG/DOSE,) 2 MG/1.5ML SOPN Inject 0.25 mg into the skin once a week for 30 days, THEN 0.5 mg once a week. 3 mL 0    Syringe/Needle, Disp, (SYRINGE 3CC/25GX1") 25G X 1" 3 ML MISC Use for monthly B12 injections  3 each 3    XTAMPZA ER 18 MG C12A Take 18 mg by mouth every 12 (twelve) hours.      No current facility-administered medications for this visit.   Allergies  Allergen Reactions   Phenergan [Promethazine Hcl] Anxiety   Trazodone And Nefazodone Rash    Social History   Tobacco Use   Smoking status: Former    Packs/day: 0.25    Years: 20.00    Pack years: 5.00    Types: Cigarettes    Quit date: 09/22/2018    Years since quitting: 2.3   Smokeless tobacco: Never   Tobacco comments:    states she quit 3 months ago  Substance Use Topics   Alcohol use: Yes    Alcohol/week: 0.0 standard drinks    Comment: social    Family History  Problem Relation Age of Onset   Stroke Mother    Hypertension Mother    Hyperlipidemia Mother    Diabetes Mother    Diabetes Brother    Hypertension Brother    Hypertension Brother    Hypertension Brother    Hypertension Brother    Hypertension Brother    Alcoholism Brother      Review of Systems  Constitutional: Negative.   HENT: Negative.    Eyes: Negative.   Respiratory: Negative.    Cardiovascular: Negative.   Gastrointestinal: Negative.   Endocrine: Negative.   Genitourinary: Negative.   Musculoskeletal:  Positive for arthralgias, back pain  and gait problem.  Skin: Negative.   Allergic/Immunologic: Negative.   Neurological:  Negative for dizziness.  Hematological: Negative.   Psychiatric/Behavioral: Negative.     Objective:  Physical Exam Constitutional:      Appearance: She is obese.  HENT:     Head: Normocephalic.  Eyes:     Pupils: Pupils are equal, round, and reactive to light.  Cardiovascular:     Rate and Rhythm: Normal rate.  Pulmonary:     Effort: Pulmonary effort is normal.  Abdominal:     Palpations: Abdomen is soft.  Genitourinary:    Comments: Deferred Musculoskeletal:        General: Tenderness present.     Cervical back: Normal range of motion.  Skin:    General: Skin is warm and dry.  Neurological:     Mental Status: She is alert and oriented to person, place, and time.  Psychiatric:        Behavior: Behavior normal.    Vital signs in last 24 hours: @VSRANGES @  Labs:   Estimated body mass index is 44.11 kg/m as calculated from the following:   Height as of 12/26/20: 5\' 4"  (1.626 m).   Weight as of 12/26/20: 116.6 kg.   Imaging Review Plain radiographs demonstrate severe degenerative joint disease of the right hip(s). The bone quality appears to be adequate for age and reported activity level.      Assessment/Plan:  End stage arthritis, right hip(s)  The patient history, physical examination, clinical judgement of the provider and imaging studies are consistent with end stage degenerative joint disease of the right hip(s) and total hip arthroplasty is deemed medically necessary. The treatment options including medical management, injection therapy, arthroscopy and arthroplasty were discussed at length. The risks and benefits of total hip arthroplasty were presented and reviewed. The risks due to aseptic loosening, infection, stiffness, dislocation/subluxation,  thromboembolic complications and other imponderables were discussed.  The patient acknowledged the explanation, agreed to  proceed with the plan and consent was signed. Patient is  being admitted for inpatient treatment for surgery, pain control, PT, OT, prophylactic antibiotics, VTE prophylaxis, progressive ambulation and ADL's and discharge planning.The patient is planning to be discharged  home after observation

## 2021-01-13 NOTE — H&P (Signed)
TOTAL HIP ADMISSION H&P  Patient is admitted for right total hip arthroplasty.  Subjective:  Chief Complaint: right hip pain  HPI: Misty [name], 60 y.o. female, has a history of pain and functional disability in the right hip(s) due to arthritis and patient has failed non-surgical conservative treatments for greater than 12 weeks to include NSAID's and/or analgesics and activity modification.  Onset of symptoms was gradual starting 3 years ago with gradually worsening course since that time.The patient noted no past surgery on the right hip(s).  Patient currently rates pain in the right hip at 9 out of 10 with activity. Patient has worsening of pain with activity and weight bearing, pain that interfers with activities of daily living, and pain with passive range of motion. Patient has evidence of subchondral cysts, subchondral sclerosis, and joint space narrowing by imaging studies. This condition presents safety issues increasing the risk of falls. There is no current active infection.  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/08/2020   Myalgia 06/29/2020   Bilateral leg edema 06/29/2020   Surgical wound dehiscence 06/02/2020   Avascular necrosis of hip, left ([place]) 04/28/2020   Preoperative clearance 03/21/2020   Osteoarthritis of left hip 02/19/2020   COVID-19 virus infection 04/18/2019   Urinary frequency 03/10/2019   Restless leg syndrome 03/10/2019   Hyperlipidemia 11/27/2018   Anemia 11/25/2018   Nausea 09/17/2018   Right groin pain 07/16/2018   Depression 03/03/2018   Right shoulder pain 03/03/2018   Pancreatitis 02/22/2018   Alcohol abuse 02/22/2018   Cholelithiasis 02/22/2018   Adhesive capsulitis of right shoulder 01/20/2018   Ventral hernia 09/16/2017   Greater trochanteric bursitis of right hip 04/30/2017   Prediabetes 04/16/2017   Numbness 01/29/2017   Chronic low back pain 07/20/2016   Unilateral primary osteoarthritis,  right hip 07/20/2016   Spinal stenosis of lumbar region 11/29/2015   Positive urine drug screen 09/12/9483   Umbilical hernia 46/27/0350   Sleeping difficulties 07/15/2015   Bunion, right 06/20/2015   Muscle spasm of back 04/16/2015   B12 deficiency 03/30/2015   Hammer toe of left foot 02/16/2015   Fibrosis of skin of lower extremity 02/16/2015   Cervical spondylosis with radiculopathy 03/01/2014   Hammer toe of right foot 01/26/2014   Benign intracranial hypertension 06/18/2013   Cephalalgia 04/21/2013   Porokeratosis 03/31/2013   Vitamin D deficiency 04/18/2010   MEDIAL MENISCUS TEAR, LEFT 06/30/2008   BARRETTS ESOPHAGUS 06/28/2008   Knee osteoarthritis 06/28/2008   Morbid obesity with BMI of 40.0-44.9, adult ([place]) 05/24/2008   HYPERTENSION, BENIGN ESSENTIAL 05/24/2008   Irritable bowel syndrome 05/24/2008   BARIATRIC SURGERY STATUS 03/19/2006   History of malignant neoplasm of large intestine 03/19/1997   Past Medical History:  Diagnosis Date   Allergy    Arthritis    Back pain    Colon cancer ([place])    Depression    Diarrhea    GERD (gastroesophageal reflux disease)    Hypertension    Insomnia    MS (multiple sclerosis) ([place]) 2000   pt says that Misty [name] doesn't have it   Other hammer toe (acquired) 03/31/2013   Pneumonia    hx of   Pre-diabetes    Umbilical hernia     Past Surgical History:  Procedure Laterality Date   ABDOMINAL HYSTERECTOMY     ANTERIOR CERVICAL DECOMP/DISCECTOMY FUSION N/A 03/01/2014   Procedure: ANTERIOR CERVICAL DECOMPRESSION/DISCECTOMY FUSION 1 LEVEL;  Surgeon: Charlie Pitter, MD;  Location:  [facility];  Service: Neurosurgery;  Laterality: N/A;  ANTERIOR CERVICAL DECOMPRESSION/DISCECTOMY FUSION 1 LEVEL   BUNIONECTOMY Right 10/14/2014   @[facility]    CESAREAN SECTION     COLON SURGERY     COLONOSCOPY     FOOT SURGERY Right    GASTRIC BYPASS     Hammer Toe Repair Right 10/14/2014   RT #2, @[facility]    INCISION AND DRAINAGE HIP Left 06/02/2020   Procedure:  IRRIGATION AND DEBRIDEMENT LEFT HIP, POSSIBLE HEAD BALL AND LINER EXCHANGE;  Surgeon: Rod Can, MD;  Location: [facility];  Service: Orthopedics;  Laterality: Left;  83min   TENOTOMY Right 10/14/2014   RT #3, @[facility]    TOTAL HIP ARTHROPLASTY Left 04/28/2020   Procedure: TOTAL HIP ARTHROPLASTY ANTERIOR APPROACH;  Surgeon: Rod Can, MD;  Location: [facility];  Service: Orthopedics;  Laterality: Left;  3E bed    Current Outpatient Medications  Medication Sig Dispense Refill Last Dose   acetaminophen (TYLENOL) 500 MG tablet Take 500 mg by mouth every 6 (six) hours as needed for mild pain.      albuterol (PROAIR HFA) 108 (90 Base) MCG/ACT inhaler Inhale 1-2 puffs into the lungs every 6 (six) hours as needed for wheezing or shortness of breath. 1 each 5    aspirin EC 81 MG tablet Take 81 mg by mouth daily. Swallow whole.      atorvastatin (LIPITOR) 20 MG tablet Take 1 tablet by mouth once daily 90 tablet 0    Cholecalciferol (VITAMIN D3) 50 MCG (2000 UT) capsule Take 1 capsule (2,000 Units total) by mouth daily. 90 capsule 3    cyanocobalamin (,VITAMIN B-12,) 1000 MCG/ML injection Inject 1,000 mcg into the muscle every 30 (thirty) days.      diclofenac Sodium (VOLTAREN) 1 % GEL APPLY 2 GRAMS TOPICALLY 4 TIMES DAILY (Patient taking differently: Apply 2 g topically 4 (four) times daily as needed (pain).) 100 g 0    docusate sodium (COLACE) 100 MG capsule Take 1 capsule (100 mg total) by mouth 2 (two) times daily as needed for mild constipation. (Patient taking differently: Take 200 mg by mouth daily.) 60 capsule 5    furosemide (LASIX) 40 MG tablet Take 1 tablet (40 mg total) by mouth daily as needed for fluid. 20 tablet 0    gabapentin (NEURONTIN) 300 MG capsule TAKE 3 CAPSULES BY MOUTH THREE TIMES DAILY 270 capsule 0    lisinopril (ZESTRIL) 20 MG tablet Take 1 tablet by mouth once daily 90 tablet 0    meloxicam (MOBIC) 15 MG tablet Take 1 tablet by mouth once daily with food 90 tablet 0    naloxone  (NARCAN) nasal spray 4 mg/0.1 mL Place 1 spray into the nose as needed (opioid overdose).      ondansetron (ZOFRAN) 4 MG tablet Take 1 tablet (4 mg total) by mouth every 6 (six) hours as needed. for nausea 20 tablet 0    Oxycodone HCl 10 MG TABS Take 10 mg by mouth 4 (four) times daily as needed (severe pain).      potassium chloride (KLOR-CON) 10 MEQ tablet Take 2 tablets (20 mEq total) by mouth daily as needed (lasix with lasix). 30 tablet 0    Semaglutide,0.25 or 0.5MG /DOS, (OZEMPIC, 0.25 OR 0.5 MG/DOSE,) 2 MG/1.5ML SOPN Inject 0.25 mg into the skin once a week for 30 days, THEN 0.5 mg once a week. 3 mL 0    Syringe/Needle, Disp, (SYRINGE 3CC/25GX1") 25G X 1" 3 ML MISC Use for monthly B12 injections  3 each 3    XTAMPZA ER 18 MG C12A Take 18 mg by mouth every 12 (twelve) hours.      No current facility-administered medications for this visit.   Allergies  Allergen Reactions   Phenergan [Promethazine Hcl] Anxiety   Trazodone And Nefazodone Rash    Social History   Tobacco Use   Smoking status: Former    Packs/day: 0.25    Years: 20.00    Pack years: 5.00    Types: Cigarettes    Quit date: 09/22/2018    Years since quitting: 2.3   Smokeless tobacco: Never   Tobacco comments:    states Misty [name] quit 3 months ago  Substance Use Topics   Alcohol use: Yes    Alcohol/week: 0.0 standard drinks    Comment: social    Family History  Problem Relation Age of Onset   Stroke Mother    Hypertension Mother    Hyperlipidemia Mother    Diabetes Mother    Diabetes Brother    Hypertension Brother    Hypertension Brother    Hypertension Brother    Hypertension Brother    Hypertension Brother    Alcoholism Brother      Review of Systems  Constitutional: Negative.   HENT: Negative.    Eyes: Negative.   Respiratory: Negative.    Cardiovascular: Negative.   Gastrointestinal: Negative.   Endocrine: Negative.   Genitourinary: Negative.   Musculoskeletal:  Positive for arthralgias, back pain  and gait problem.  Skin: Negative.   Allergic/Immunologic: Negative.   Neurological:  Negative for dizziness.  Hematological: Negative.   Psychiatric/Behavioral: Negative.     Objective:  Physical Exam Constitutional:      Appearance: Misty [name] is obese.  HENT:     Head: Normocephalic.  Eyes:     Pupils: Pupils are equal, round, and reactive to light.  Cardiovascular:     Rate and Rhythm: Normal rate.  Pulmonary:     Effort: Pulmonary effort is normal.  Abdominal:     Palpations: Abdomen is soft.  Genitourinary:    Comments: Deferred Musculoskeletal:        General: Tenderness present.     Cervical back: Normal range of motion.  Skin:    General: Skin is warm and dry.  Neurological:     Mental Status: Misty [name] is alert and oriented to person, place, and time.  Psychiatric:        Behavior: Behavior normal.    Vital signs in last 24 hours: @VSRANGES @  Labs:   Estimated body mass index is 44.11 kg/m as calculated from the following:   Height as of 12/26/20: 5\' 4"  (1.626 m).   Weight as of 12/26/20: 116.6 kg.   Imaging Review Plain radiographs demonstrate severe degenerative joint disease of the right hip(s). The bone quality appears to be adequate for age and reported activity level.      Assessment/Plan:  End stage arthritis, right hip(s)  The patient history, physical examination, clinical judgement of the provider and imaging studies are consistent with end stage degenerative joint disease of the right hip(s) and total hip arthroplasty is deemed medically necessary. The treatment options including medical management, injection therapy, arthroscopy and arthroplasty were discussed at length. The risks and benefits of total hip arthroplasty were presented and reviewed. The risks due to aseptic loosening, infection, stiffness, dislocation/subluxation,  thromboembolic complications and other imponderables were discussed.  The patient acknowledged the explanation, agreed to  proceed with the plan and consent was signed. Patient is  being admitted for inpatient treatment for surgery, pain control, PT, OT, prophylactic antibiotics, VTE prophylaxis, progressive ambulation and ADL's and discharge planning.The patient is planning to be discharged  home after observation

## 2021-01-16 ENCOUNTER — Ambulatory Visit: Payer: Self-pay | Admitting: Student

## 2021-01-16 DIAGNOSIS — M1611 Unilateral primary osteoarthritis, right hip: Secondary | ICD-10-CM

## 2021-01-17 ENCOUNTER — Encounter: Payer: Medicare Other | Admitting: Internal Medicine

## 2021-01-17 ENCOUNTER — Encounter: Payer: Self-pay | Admitting: Internal Medicine

## 2021-01-17 NOTE — Patient Instructions (Signed)
  Blood work was ordered.     Medications changes include :     Your prescription(s) have been submitted to your pharmacy. Please take as directed and contact our office if you believe you are having problem(s) with the medication(s).   A referral was ordered for        Someone from their office will call you to schedule an appointment.    Please followup in 6 months  

## 2021-01-17 NOTE — Progress Notes (Signed)
Subjective:    Patient ID: Misty [name], female    DOB: [birth year], 60 y.o.   MRN: [id number]  This visit occurred during the SARS-CoV-2 public health emergency.  Safety protocols were in place, including screening questions prior to the visit, additional usage of staff PPE, and extensive cleaning of exam room while observing appropriate contact time as indicated for disinfecting solutions.     HPI The patient is here for follow up of their chronic medical problems, including htn, hld, prediabetes, b/l leg edema, B12 def, chronic low back pain  She is scheduled for R hip replacement 11/9.    Medications and allergies reviewed with patient and updated if appropriate.  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[place]) 07/08/2020   Myalgia 06/29/2020   Bilateral leg edema 06/29/2020   Surgical wound dehiscence 06/02/2020   Avascular necrosis of hip, left ([place]) 04/28/2020   Preoperative clearance 03/21/2020   Osteoarthritis of left hip 02/19/2020   COVID-19 virus infection 04/18/2019   Urinary frequency 03/10/2019   Restless leg syndrome 03/10/2019   Hyperlipidemia 11/27/2018   Anemia 11/25/2018   Nausea 09/17/2018   Right groin pain 07/16/2018   Depression 03/03/2018   Right shoulder pain 03/03/2018   Pancreatitis 02/22/2018   Alcohol abuse 02/22/2018   Cholelithiasis 02/22/2018   Adhesive capsulitis of right shoulder 01/20/2018   Ventral hernia 09/16/2017   Greater trochanteric bursitis of right hip 04/30/2017   Prediabetes 04/16/2017   Numbness 01/29/2017   Chronic low back pain 07/20/2016   Unilateral primary osteoarthritis, right hip 07/20/2016   Spinal stenosis of lumbar region 11/29/2015   Positive urine drug screen 52/84/1324   Umbilical hernia 40/12/2723   Sleeping difficulties 07/15/2015   Bunion, right 06/20/2015   Muscle spasm of back 04/16/2015   B12 deficiency 03/30/2015   Hammer toe of left foot 02/16/2015   Fibrosis  of skin of lower extremity 02/16/2015   Cervical spondylosis with radiculopathy 03/01/2014   Hammer toe of right foot 01/26/2014   Benign intracranial hypertension 06/18/2013   Cephalalgia 04/21/2013   Porokeratosis 03/31/2013   Vitamin D deficiency 04/18/2010   MEDIAL MENISCUS TEAR, LEFT 06/30/2008   BARRETTS ESOPHAGUS 06/28/2008   Knee osteoarthritis 06/28/2008   Morbid obesity with BMI of 40.0-44.9, adult ([place]) 05/24/2008   HYPERTENSION, BENIGN ESSENTIAL 05/24/2008   Irritable bowel syndrome 05/24/2008   BARIATRIC SURGERY STATUS 03/19/2006   History of malignant neoplasm of large intestine 03/19/1997    Current Outpatient Medications on File Prior to Visit  Medication Sig Dispense Refill   acetaminophen (TYLENOL) 500 MG tablet Take 500 mg by mouth every 6 (six) hours as needed for mild pain.     albuterol (PROAIR HFA) 108 (90 Base) MCG/ACT inhaler Inhale 1-2 puffs into the lungs every 6 (six) hours as needed for wheezing or shortness of breath. 1 each 5   aspirin EC 81 MG tablet Take 81 mg by mouth daily. Swallow whole.     atorvastatin (LIPITOR) 20 MG tablet Take 1 tablet by mouth once daily 90 tablet 0   Cholecalciferol (VITAMIN D3) 50 MCG (2000 UT) capsule Take 1 capsule (2,000 Units total) by mouth daily. 90 capsule 3   cyanocobalamin (,VITAMIN B-12,) 1000 MCG/ML injection Inject 1,000 mcg into the muscle every 30 (thirty) days.     diclofenac Sodium (VOLTAREN) 1 % GEL APPLY 2 GRAMS TOPICALLY 4 TIMES DAILY (Patient taking differently: Apply 2 g topically 4 (four) times daily  as needed (pain).) 100 g 0   docusate sodium (COLACE) 100 MG capsule Take 1 capsule (100 mg total) by mouth 2 (two) times daily as needed for mild constipation. (Patient taking differently: Take 200 mg by mouth daily.) 60 capsule 5   furosemide (LASIX) 40 MG tablet Take 1 tablet (40 mg total) by mouth daily as needed for fluid. 20 tablet 0   gabapentin (NEURONTIN) 300 MG capsule TAKE 3 CAPSULES BY MOUTH THREE  TIMES DAILY 270 capsule 0   lisinopril (ZESTRIL) 20 MG tablet Take 1 tablet by mouth once daily 90 tablet 0   meloxicam (MOBIC) 15 MG tablet Take 1 tablet by mouth once daily with food 90 tablet 0   naloxone (NARCAN) nasal spray 4 mg/0.1 mL Place 1 spray into the nose as needed (opioid overdose).     ondansetron (ZOFRAN) 4 MG tablet Take 1 tablet (4 mg total) by mouth every 6 (six) hours as needed. for nausea 20 tablet 0   Oxycodone HCl 10 MG TABS Take 10 mg by mouth 4 (four) times daily as needed (severe pain).     potassium chloride (KLOR-CON) 10 MEQ tablet Take 2 tablets (20 mEq total) by mouth daily as needed (lasix with lasix). 30 tablet 0   Semaglutide,0.25 or 0.5MG /DOS, (OZEMPIC, 0.25 OR 0.5 MG/DOSE,) 2 MG/1.5ML SOPN Inject 0.25 mg into the skin once a week for 30 days, THEN 0.5 mg once a week. 3 mL 0   Syringe/Needle, Disp, (SYRINGE 3CC/25GX1") 25G X 1" 3 ML MISC Use for monthly B12 injections 3 each 3   XTAMPZA ER 18 MG C12A Take 18 mg by mouth every 12 (twelve) hours.     No current facility-administered medications on file prior to visit.    Past Medical History:  Diagnosis Date   Allergy    Arthritis    Back pain    Colon cancer ([place])    Depression    Diarrhea    GERD (gastroesophageal reflux disease)    Hypertension    Insomnia    MS (multiple sclerosis) ([place]) 2000   pt says that she doesn't have it   Other hammer toe (acquired) 03/31/2013   Pneumonia    hx of   Pre-diabetes    Umbilical hernia     Past Surgical History:  Procedure Laterality Date   ABDOMINAL HYSTERECTOMY     ANTERIOR CERVICAL DECOMP/DISCECTOMY FUSION N/A 03/01/2014   Procedure: ANTERIOR CERVICAL DECOMPRESSION/DISCECTOMY FUSION 1 LEVEL;  Surgeon: Charlie Pitter, MD;  Location: [facility];  Service: Neurosurgery;  Laterality: N/A;  ANTERIOR CERVICAL DECOMPRESSION/DISCECTOMY FUSION 1 LEVEL   BUNIONECTOMY Right 10/14/2014   @[facility]    CESAREAN SECTION     COLON SURGERY     COLONOSCOPY     FOOT SURGERY  Right    GASTRIC BYPASS     Hammer Toe Repair Right 10/14/2014   RT #2, @[facility]    INCISION AND DRAINAGE HIP Left 06/02/2020   Procedure: IRRIGATION AND DEBRIDEMENT LEFT HIP, POSSIBLE HEAD BALL AND LINER EXCHANGE;  Surgeon: Rod Can, MD;  Location: [facility];  Service: Orthopedics;  Laterality: Left;  64min   TENOTOMY Right 10/14/2014   RT #3, @[facility]    TOTAL HIP ARTHROPLASTY Left 04/28/2020   Procedure: TOTAL HIP ARTHROPLASTY ANTERIOR APPROACH;  Surgeon: Rod Can, MD;  Location: [facility];  Service: Orthopedics;  Laterality: Left;  3E bed    Social History   Socioeconomic History   Marital status: Single    Spouse name: Not on file  Number of children: 2   Years of education: Not on file   Highest education level: Not on file  Occupational History   Occupation: Disability  Tobacco Use   Smoking status: Former    Packs/day: 0.25    Years: 20.00    Pack years: 5.00    Types: Cigarettes    Quit date: 09/22/2018    Years since quitting: 2.3   Smokeless tobacco: Never   Tobacco comments:    states she quit 3 months ago  Vaping Use   Vaping Use: Never used  Substance and Sexual Activity   Alcohol use: Yes    Alcohol/week: 0.0 standard drinks    Comment: social   Drug use: No   Sexual activity: Yes    Partners: Male  Other Topics Concern   Not on file  Social History Narrative   Not on file   Social Determinants of Health   Financial Resource Strain: Not on file  Food Insecurity: Not on file  Transportation Needs: Not on file  Physical Activity: Not on file  Stress: Not on file  Social Connections: Not on file    Family History  Problem Relation Age of Onset   Stroke Mother    Hypertension Mother    Hyperlipidemia Mother    Diabetes Mother    Diabetes Brother    Hypertension Brother    Hypertension Brother    Hypertension Brother    Hypertension Brother    Hypertension Brother    Alcoholism Brother     Review of Systems     Objective:  There were no  vitals filed for this visit. BP Readings from Last 3 Encounters:  12/26/20 126/74  09/01/20 128/84  07/18/20 128/72   Wt Readings from Last 3 Encounters:  12/26/20 257 lb (116.6 kg)  09/01/20 261 lb 12.8 oz (118.8 kg)  07/18/20 251 lb (113.9 kg)   There is no height or weight on file to calculate BMI.   Physical Exam    Constitutional: Appears well-developed and well-nourished. No distress.  HENT:  Head: Normocephalic and atraumatic.  Neck: Neck supple. No tracheal deviation present. No thyromegaly present.  No cervical lymphadenopathy Cardiovascular: Normal rate, regular rhythm and normal heart sounds.   No murmur heard. No carotid bruit .  No edema Pulmonary/Chest: Effort normal and breath sounds normal. No respiratory distress. No has no wheezes. No rales.  Skin: Skin is warm and dry. Not diaphoretic.  Psychiatric: Normal mood and affect. Behavior is normal.      Assessment & Plan:    See Problem List for Assessment and Plan of chronic medical problems.    This encounter was created in error - please disregard.

## 2021-01-19 ENCOUNTER — Other Ambulatory Visit: Payer: Self-pay

## 2021-01-19 ENCOUNTER — Encounter (HOSPITAL_COMMUNITY): Payer: Self-pay

## 2021-01-19 ENCOUNTER — Encounter (HOSPITAL_COMMUNITY)
Admission: RE | Admit: 2021-01-19 | Discharge: 2021-01-19 | Disposition: A | Discharge status: Home / Self Care | Payer: Medicare Other | Source: Ambulatory Visit | Attending: Orthopedic Surgery | Admitting: Orthopedic Surgery

## 2021-01-19 VITALS — BP 124/71 | HR 82 | Temp 98.2°F | Resp 18 | Ht 64.5 in | Wt 233.6 lb

## 2021-01-19 DIAGNOSIS — R7303 Prediabetes: Secondary | ICD-10-CM | POA: Insufficient documentation

## 2021-01-19 DIAGNOSIS — Z01812 Encounter for preprocedural laboratory examination: Secondary | ICD-10-CM | POA: Diagnosis not present

## 2021-01-19 DIAGNOSIS — M5136 Other intervertebral disc degeneration, lumbar region: Secondary | ICD-10-CM | POA: Diagnosis not present

## 2021-01-19 DIAGNOSIS — Z79899 Other long term (current) drug therapy: Secondary | ICD-10-CM | POA: Diagnosis not present

## 2021-01-19 DIAGNOSIS — M160 Bilateral primary osteoarthritis of hip: Secondary | ICD-10-CM | POA: Diagnosis not present

## 2021-01-19 DIAGNOSIS — M47817 Spondylosis without myelopathy or radiculopathy, lumbosacral region: Secondary | ICD-10-CM | POA: Diagnosis not present

## 2021-01-19 DIAGNOSIS — M1611 Unilateral primary osteoarthritis, right hip: Secondary | ICD-10-CM | POA: Insufficient documentation

## 2021-01-19 DIAGNOSIS — G894 Chronic pain syndrome: Secondary | ICD-10-CM | POA: Diagnosis not present

## 2021-01-19 DIAGNOSIS — Z01818 Encounter for other preprocedural examination: Secondary | ICD-10-CM

## 2021-01-19 LAB — CBC
HCT: 39.8 % (ref 36.0–46.0)
Hemoglobin: 12.2 g/dL (ref 12.0–15.0)
MCH: 29.9 pg (ref 26.0–34.0)
MCHC: 30.7 g/dL (ref 30.0–36.0)
MCV: 97.5 fL (ref 80.0–100.0)
Platelets: 218 10*3/uL (ref 150–400)
RBC: 4.08 MIL/uL (ref 3.87–5.11)
RDW: 14.1 % (ref 11.5–15.5)
WBC: 5.9 10*3/uL (ref 4.0–10.5)
nRBC: 0 % (ref 0.0–0.2)

## 2021-01-19 LAB — COMPREHENSIVE METABOLIC PANEL
ALT: 18 U/L (ref 0–44)
AST: 23 U/L (ref 15–41)
Albumin: 4.1 g/dL (ref 3.5–5.0)
Alkaline Phosphatase: 117 U/L (ref 38–126)
Anion gap: 7 (ref 5–15)
BUN: 23 mg/dL — ABNORMAL HIGH (ref 6–20)
CO2: 22 mmol/L (ref 22–32)
Calcium: 9.9 mg/dL (ref 8.9–10.3)
Chloride: 105 mmol/L (ref 98–111)
Creatinine, Ser: 1.26 mg/dL — ABNORMAL HIGH (ref 0.44–1.00)
GFR, Estimated: 49 mL/min — ABNORMAL LOW (ref >60)
Glucose, Bld: 78 mg/dL (ref 70–99)
Potassium: 5.1 mmol/L (ref 3.5–5.1)
Sodium: 134 mmol/L — ABNORMAL LOW (ref 135–145)
Total Bilirubin: 0.9 mg/dL (ref 0.3–1.2)
Total Protein: 7.5 g/dL (ref 6.5–8.1)

## 2021-01-19 LAB — GLUCOSE, CAPILLARY: Glucose-Capillary: 74 mg/dL (ref 70–99)

## 2021-01-19 LAB — PROTIME-INR
INR: 1 (ref 0.8–1.2)
Prothrombin Time: 13.6 seconds (ref 11.4–15.2)

## 2021-01-19 LAB — SURGICAL PCR SCREEN
MRSA, PCR: POSITIVE — AB
Staphylococcus aureus: POSITIVE — AB

## 2021-01-19 LAB — HEMOGLOBIN A1C
Hgb A1c MFr Bld: 5.3 % (ref 4.8–5.6)
Mean Plasma Glucose: 105.41 mg/dL

## 2021-01-19 NOTE — Progress Notes (Signed)
PCR results sent to Dr. Swinteck to review.   

## 2021-01-21 DIAGNOSIS — G894 Chronic pain syndrome: Secondary | ICD-10-CM | POA: Diagnosis not present

## 2021-01-21 DIAGNOSIS — M25551 Pain in right hip: Secondary | ICD-10-CM | POA: Diagnosis not present

## 2021-01-23 ENCOUNTER — Other Ambulatory Visit: Payer: Self-pay | Admitting: Orthopedic Surgery

## 2021-01-23 LAB — SARS CORONAVIRUS 2 (TAT 6-24 HRS): SARS Coronavirus 2: NEGATIVE

## 2021-01-24 ENCOUNTER — Encounter (HOSPITAL_COMMUNITY): Payer: Self-pay | Admitting: Orthopedic Surgery

## 2021-01-24 NOTE — Anesthesia Preprocedure Evaluation (Addendum)
Anesthesia Evaluation  Patient identified by MRN, date of birth, ID band Patient awake    Reviewed: Allergy & Precautions, NPO status , Patient's Chart, lab work & pertinent test results  History of Anesthesia Complications Negative for: history of anesthetic complications  Airway Mallampati: II  TM Distance: >3 FB Neck ROM: Full    Dental  (+) Poor Dentition, Partial Lower, Partial Upper   Pulmonary neg pulmonary ROS, Patient abstained from smoking., former smoker,    Pulmonary exam normal        Cardiovascular hypertension, Pt. on medications Normal cardiovascular exam     Neuro/Psych  Headaches, Depression    GI/Hepatic GERD  ,(+)     Substance abuse: oxycodone ER 18 mg BID.  ,   Endo/Other  diabetes (pre-diabetes, no meds)Morbid obesity  Renal/GU negative Renal ROS  negative genitourinary   Musculoskeletal  (+) Arthritis , Osteoarthritis,  S/p left THR 02/22   Abdominal   Peds  Hematology  (+) anemia ,   Anesthesia Other Findings   Reproductive/Obstetrics                            Anesthesia Physical  Anesthesia Plan  ASA: 3  Anesthesia Plan: Spinal and MAC   Post-op Pain Management:    Induction: Intravenous  PONV Risk Score and Plan: 2 and Ondansetron, Midazolam and Propofol infusion  Airway Management Planned: Natural Airway  Additional Equipment: None  Intra-op Plan:   Post-operative Plan:   Informed Consent: I have reviewed the patients History and Physical, chart, labs and discussed the procedure including the risks, benefits and alternatives for the proposed anesthesia with the patient or authorized representative who has indicated his/her understanding and acceptance.     Dental advisory given  Plan Discussed with: Anesthesiologist and CRNA  Anesthesia Plan Comments:        Anesthesia Quick Evaluation

## 2021-01-25 ENCOUNTER — Ambulatory Visit (HOSPITAL_COMMUNITY): Payer: Medicare Other

## 2021-01-25 ENCOUNTER — Encounter (HOSPITAL_COMMUNITY): Payer: Self-pay | Admitting: Orthopedic Surgery

## 2021-01-25 ENCOUNTER — Ambulatory Visit (HOSPITAL_COMMUNITY): Payer: Medicare Other | Admitting: Anesthesiology

## 2021-01-25 ENCOUNTER — Encounter (HOSPITAL_COMMUNITY)
Admission: RE | Disposition: A | Discharge status: Home / Self Care | Payer: Self-pay | Source: Ambulatory Visit | Attending: Orthopedic Surgery

## 2021-01-25 ENCOUNTER — Observation Stay (HOSPITAL_COMMUNITY)
Admission: RE | Admit: 2021-01-25 | Discharge: 2021-01-27 | Disposition: A | Discharge status: Home / Self Care | Payer: Medicare Other | Source: Ambulatory Visit | Attending: Orthopedic Surgery | Admitting: Orthopedic Surgery

## 2021-01-25 ENCOUNTER — Other Ambulatory Visit: Payer: Self-pay

## 2021-01-25 DIAGNOSIS — R7303 Prediabetes: Secondary | ICD-10-CM | POA: Diagnosis not present

## 2021-01-25 DIAGNOSIS — Z79899 Other long term (current) drug therapy: Secondary | ICD-10-CM | POA: Insufficient documentation

## 2021-01-25 DIAGNOSIS — M1611 Unilateral primary osteoarthritis, right hip: Secondary | ICD-10-CM | POA: Diagnosis not present

## 2021-01-25 DIAGNOSIS — G35 Multiple sclerosis: Secondary | ICD-10-CM | POA: Diagnosis not present

## 2021-01-25 DIAGNOSIS — M87059 Idiopathic aseptic necrosis of unspecified femur: Secondary | ICD-10-CM | POA: Insufficient documentation

## 2021-01-25 DIAGNOSIS — M87051 Idiopathic aseptic necrosis of right femur: Secondary | ICD-10-CM | POA: Diagnosis not present

## 2021-01-25 DIAGNOSIS — Z8616 Personal history of COVID-19: Secondary | ICD-10-CM | POA: Insufficient documentation

## 2021-01-25 DIAGNOSIS — Z09 Encounter for follow-up examination after completed treatment for conditions other than malignant neoplasm: Secondary | ICD-10-CM

## 2021-01-25 DIAGNOSIS — Z01818 Encounter for other preprocedural examination: Secondary | ICD-10-CM

## 2021-01-25 DIAGNOSIS — Z9889 Other specified postprocedural states: Secondary | ICD-10-CM | POA: Diagnosis not present

## 2021-01-25 DIAGNOSIS — Z87891 Personal history of nicotine dependence: Secondary | ICD-10-CM | POA: Insufficient documentation

## 2021-01-25 DIAGNOSIS — Z6841 Body Mass Index (BMI) 40.0 and over, adult: Secondary | ICD-10-CM | POA: Insufficient documentation

## 2021-01-25 DIAGNOSIS — M48061 Spinal stenosis, lumbar region without neurogenic claudication: Secondary | ICD-10-CM | POA: Diagnosis not present

## 2021-01-25 DIAGNOSIS — Z96642 Presence of left artificial hip joint: Secondary | ICD-10-CM | POA: Diagnosis not present

## 2021-01-25 DIAGNOSIS — Z419 Encounter for procedure for purposes other than remedying health state, unspecified: Secondary | ICD-10-CM

## 2021-01-25 DIAGNOSIS — I10 Essential (primary) hypertension: Secondary | ICD-10-CM | POA: Insufficient documentation

## 2021-01-25 DIAGNOSIS — Z7984 Long term (current) use of oral hypoglycemic drugs: Secondary | ICD-10-CM | POA: Insufficient documentation

## 2021-01-25 DIAGNOSIS — Z471 Aftercare following joint replacement surgery: Secondary | ICD-10-CM | POA: Diagnosis not present

## 2021-01-25 DIAGNOSIS — Z96643 Presence of artificial hip joint, bilateral: Secondary | ICD-10-CM | POA: Diagnosis not present

## 2021-01-25 DIAGNOSIS — E559 Vitamin D deficiency, unspecified: Secondary | ICD-10-CM | POA: Diagnosis not present

## 2021-01-25 DIAGNOSIS — Z85038 Personal history of other malignant neoplasm of large intestine: Secondary | ICD-10-CM | POA: Diagnosis not present

## 2021-01-25 DIAGNOSIS — M87851 Other osteonecrosis, right femur: Secondary | ICD-10-CM | POA: Diagnosis not present

## 2021-01-25 DIAGNOSIS — Z96641 Presence of right artificial hip joint: Secondary | ICD-10-CM | POA: Diagnosis not present

## 2021-01-25 HISTORY — PX: TOTAL HIP ARTHROPLASTY: SHX124

## 2021-01-25 LAB — TYPE AND SCREEN
ABO/RH(D): O POS
Antibody Screen: NEGATIVE

## 2021-01-25 LAB — GLUCOSE, CAPILLARY
Glucose-Capillary: 198 mg/dL — ABNORMAL HIGH (ref 70–99)
Glucose-Capillary: 172 mg/dL — ABNORMAL HIGH (ref 70–99)

## 2021-01-25 SURGERY — ARTHROPLASTY, HIP, TOTAL, ANTERIOR APPROACH
Anesthesia: Monitor Anesthesia Care | Site: Hip | Laterality: Right

## 2021-01-25 MED ORDER — TRANEXAMIC ACID-NACL 1000-0.7 MG/100ML-% IV SOLN
1000.0000 mg | INTRAVENOUS | Status: AC
  Administered 2021-01-25: 1000 mg via INTRAVENOUS
  Filled 2021-01-25: qty 100

## 2021-01-25 MED ORDER — DEXAMETHASONE SODIUM PHOSPHATE 10 MG/ML IJ SOLN
INTRAMUSCULAR | Status: DC | PRN
  Administered 2021-01-25: 8 mg via INTRAVENOUS

## 2021-01-25 MED ORDER — ACETAMINOPHEN 10 MG/ML IV SOLN
1000.0000 mg | Freq: Once | INTRAVENOUS | Status: AC
  Administered 2021-01-25: 1000 mg via INTRAVENOUS
  Filled 2021-01-25: qty 100

## 2021-01-25 MED ORDER — VANCOMYCIN HCL 1500 MG/300ML IV SOLN
1500.0000 mg | INTRAVENOUS | Status: AC
  Administered 2021-01-25: 1500 mg via INTRAVENOUS
  Filled 2021-01-25: qty 300

## 2021-01-25 MED ORDER — PHENYLEPHRINE 40 MCG/ML (10ML) SYRINGE FOR IV PUSH (FOR BLOOD PRESSURE SUPPORT)
PREFILLED_SYRINGE | INTRAVENOUS | Status: AC
  Filled 2021-01-25: qty 10

## 2021-01-25 MED ORDER — INSULIN ASPART 100 UNIT/ML IJ SOLN
0.0000 [IU] | Freq: Every day | INTRAMUSCULAR | Status: DC
  Administered 2021-01-26: 2 [IU] via SUBCUTANEOUS

## 2021-01-25 MED ORDER — ALBUMIN HUMAN 5 % IV SOLN
INTRAVENOUS | Status: AC
  Filled 2021-01-25: qty 250

## 2021-01-25 MED ORDER — POVIDONE-IODINE 10 % EX SWAB: 2.0000 "application " | Freq: Once | CUTANEOUS | Status: DC

## 2021-01-25 MED ORDER — INSULIN ASPART 100 UNIT/ML IJ SOLN
0.0000 [IU] | Freq: Three times a day (TID) | INTRAMUSCULAR | Status: DC
  Administered 2021-01-25: 2 [IU] via SUBCUTANEOUS
  Administered 2021-01-26 (×2): 1 [IU] via SUBCUTANEOUS

## 2021-01-25 MED ORDER — METOCLOPRAMIDE HCL 5 MG/ML IJ SOLN: 5.0000 mg | Freq: Three times a day (TID) | INTRAMUSCULAR | Status: DC | PRN

## 2021-01-25 MED ORDER — FENTANYL CITRATE PF 50 MCG/ML IJ SOSY: 25.0000 ug | PREFILLED_SYRINGE | INTRAMUSCULAR | Status: DC | PRN

## 2021-01-25 MED ORDER — EPHEDRINE 5 MG/ML INJ
INTRAVENOUS | Status: AC
  Filled 2021-01-25: qty 5

## 2021-01-25 MED ORDER — SODIUM CHLORIDE 0.9 % IV SOLN: INTRAVENOUS | Status: DC

## 2021-01-25 MED ORDER — SODIUM CHLORIDE 0.9 % IR SOLN
Status: DC | PRN
  Administered 2021-01-25: 1000 mL

## 2021-01-25 MED ORDER — FENTANYL CITRATE (PF) 100 MCG/2ML IJ SOLN
INTRAMUSCULAR | Status: DC | PRN
  Administered 2021-01-25: 50 ug via INTRAVENOUS

## 2021-01-25 MED ORDER — METOCLOPRAMIDE HCL 5 MG PO TABS: 5.0000 mg | ORAL_TABLET | Freq: Three times a day (TID) | ORAL | Status: DC | PRN

## 2021-01-25 MED ORDER — VITAMIN D 25 MCG (1000 UNIT) PO TABS
2000.0000 [IU] | ORAL_TABLET | Freq: Every day | ORAL | Status: DC
  Administered 2021-01-26 – 2021-01-27 (×2): 2000 [IU] via ORAL
  Filled 2021-01-25 (×3): qty 2

## 2021-01-25 MED ORDER — METHOCARBAMOL 500 MG PO TABS
500.0000 mg | ORAL_TABLET | Freq: Four times a day (QID) | ORAL | Status: DC | PRN
  Administered 2021-01-25 – 2021-01-27 (×5): 500 mg via ORAL
  Filled 2021-01-25 (×5): qty 1

## 2021-01-25 MED ORDER — ONDANSETRON HCL 4 MG/2ML IJ SOLN: 4.0000 mg | Freq: Four times a day (QID) | INTRAMUSCULAR | Status: DC | PRN

## 2021-01-25 MED ORDER — EPHEDRINE SULFATE-NACL 50-0.9 MG/10ML-% IV SOSY
PREFILLED_SYRINGE | INTRAVENOUS | Status: DC | PRN
  Administered 2021-01-25: 5 mg via INTRAVENOUS

## 2021-01-25 MED ORDER — ALBUMIN HUMAN 5 % IV SOLN: INTRAVENOUS | Status: DC | PRN

## 2021-01-25 MED ORDER — FUROSEMIDE 40 MG PO TABS
40.0000 mg | ORAL_TABLET | Freq: Every day | ORAL | Status: DC | PRN
  Administered 2021-01-26: 40 mg via ORAL
  Filled 2021-01-25: qty 1

## 2021-01-25 MED ORDER — DEXAMETHASONE SODIUM PHOSPHATE 10 MG/ML IJ SOLN
10.0000 mg | Freq: Once | INTRAMUSCULAR | Status: AC
  Administered 2021-01-26: 10 mg via INTRAVENOUS
  Filled 2021-01-25: qty 1

## 2021-01-25 MED ORDER — BUPIVACAINE-EPINEPHRINE (PF) 0.25% -1:200000 IJ SOLN
INTRAMUSCULAR | Status: AC
  Filled 2021-01-25: qty 30

## 2021-01-25 MED ORDER — HYDROMORPHONE HCL 1 MG/ML IJ SOLN
0.5000 mg | INTRAMUSCULAR | Status: DC | PRN
  Administered 2021-01-25 – 2021-01-26 (×3): 1 mg via INTRAVENOUS
  Filled 2021-01-25 (×3): qty 1

## 2021-01-25 MED ORDER — PROPOFOL 500 MG/50ML IV EMUL
INTRAVENOUS | Status: DC | PRN
  Administered 2021-01-25: 65 ug/kg/min via INTRAVENOUS

## 2021-01-25 MED ORDER — POLYETHYLENE GLYCOL 3350 17 G PO PACK: 17.0000 g | PACK | Freq: Every day | ORAL | Status: DC | PRN

## 2021-01-25 MED ORDER — KETOROLAC TROMETHAMINE 30 MG/ML IJ SOLN
INTRAMUSCULAR | Status: DC | PRN
  Administered 2021-01-25: 30 mg via INTRA_ARTICULAR

## 2021-01-25 MED ORDER — KETOROLAC TROMETHAMINE 30 MG/ML IJ SOLN
INTRAMUSCULAR | Status: AC
  Filled 2021-01-25: qty 1

## 2021-01-25 MED ORDER — GABAPENTIN 300 MG PO CAPS
900.0000 mg | ORAL_CAPSULE | Freq: Three times a day (TID) | ORAL | Status: DC
  Administered 2021-01-25 – 2021-01-27 (×6): 900 mg via ORAL
  Filled 2021-01-25 (×6): qty 3

## 2021-01-25 MED ORDER — FENTANYL CITRATE (PF) 100 MCG/2ML IJ SOLN
INTRAMUSCULAR | Status: AC
  Filled 2021-01-25: qty 2

## 2021-01-25 MED ORDER — ISOPROPYL ALCOHOL 70 % SOLN
Status: DC | PRN
  Administered 2021-01-25: 1 via TOPICAL

## 2021-01-25 MED ORDER — PHENYLEPHRINE 40 MCG/ML (10ML) SYRINGE FOR IV PUSH (FOR BLOOD PRESSURE SUPPORT)
PREFILLED_SYRINGE | INTRAVENOUS | Status: DC | PRN
  Administered 2021-01-25: 40 ug via INTRAVENOUS
  Administered 2021-01-25 (×4): 80 ug via INTRAVENOUS

## 2021-01-25 MED ORDER — PROPOFOL 10 MG/ML IV BOLUS
INTRAVENOUS | Status: AC
  Filled 2021-01-25: qty 40

## 2021-01-25 MED ORDER — ONDANSETRON HCL 4 MG/2ML IJ SOLN
INTRAMUSCULAR | Status: AC
  Filled 2021-01-25: qty 2

## 2021-01-25 MED ORDER — ORAL CARE MOUTH RINSE: 15.0000 mL | Freq: Once | OROMUCOSAL | Status: AC

## 2021-01-25 MED ORDER — DEXAMETHASONE SODIUM PHOSPHATE 10 MG/ML IJ SOLN
INTRAMUSCULAR | Status: AC
  Filled 2021-01-25: qty 1

## 2021-01-25 MED ORDER — NALOXONE HCL 4 MG/0.1ML NA LIQD: 1.0000 | NASAL | Status: DC | PRN

## 2021-01-25 MED ORDER — ACETAMINOPHEN 500 MG PO TABS
1000.0000 mg | ORAL_TABLET | Freq: Once | ORAL | Status: DC
  Filled 2021-01-25: qty 2

## 2021-01-25 MED ORDER — BUPIVACAINE IN DEXTROSE 0.75-8.25 % IT SOLN
INTRATHECAL | Status: DC | PRN
  Administered 2021-01-25: 1.8 mL via INTRATHECAL

## 2021-01-25 MED ORDER — MIDAZOLAM HCL 2 MG/2ML IJ SOLN
INTRAMUSCULAR | Status: AC
  Filled 2021-01-25: qty 2

## 2021-01-25 MED ORDER — OXYCODONE HCL 5 MG PO TABS
10.0000 mg | ORAL_TABLET | ORAL | Status: DC | PRN
  Administered 2021-01-26 (×2): 15 mg via ORAL
  Administered 2021-01-26: 10 mg via ORAL
  Administered 2021-01-26: 15 mg via ORAL
  Filled 2021-01-25: qty 2
  Filled 2021-01-25 (×3): qty 3
  Filled 2021-01-25: qty 2

## 2021-01-25 MED ORDER — ONDANSETRON HCL 4 MG PO TABS: 4.0000 mg | ORAL_TABLET | Freq: Four times a day (QID) | ORAL | Status: DC | PRN

## 2021-01-25 MED ORDER — ATORVASTATIN CALCIUM 20 MG PO TABS
20.0000 mg | ORAL_TABLET | Freq: Every day | ORAL | Status: DC
  Administered 2021-01-26 – 2021-01-27 (×2): 20 mg via ORAL
  Filled 2021-01-25 (×2): qty 1

## 2021-01-25 MED ORDER — PROPOFOL 1000 MG/100ML IV EMUL
INTRAVENOUS | Status: AC
  Filled 2021-01-25: qty 200

## 2021-01-25 MED ORDER — PHENYLEPHRINE HCL-NACL 20-0.9 MG/250ML-% IV SOLN
INTRAVENOUS | Status: DC | PRN
  Administered 2021-01-25: 25 ug/min via INTRAVENOUS

## 2021-01-25 MED ORDER — OXYCODONE HCL 5 MG PO TABS
5.0000 mg | ORAL_TABLET | ORAL | Status: DC | PRN
  Administered 2021-01-25 – 2021-01-27 (×2): 10 mg via ORAL
  Administered 2021-01-27: 5 mg via ORAL
  Filled 2021-01-25: qty 1
  Filled 2021-01-25 (×4): qty 2

## 2021-01-25 MED ORDER — CEFAZOLIN SODIUM-DEXTROSE 2-4 GM/100ML-% IV SOLN
2.0000 g | Freq: Four times a day (QID) | INTRAVENOUS | Status: AC
  Administered 2021-01-25 (×2): 2 g via INTRAVENOUS
  Filled 2021-01-25 (×2): qty 100

## 2021-01-25 MED ORDER — METHOCARBAMOL 500 MG IVPB - SIMPLE MED
500.0000 mg | Freq: Four times a day (QID) | INTRAVENOUS | Status: DC | PRN
  Filled 2021-01-25: qty 50

## 2021-01-25 MED ORDER — BUPIVACAINE-EPINEPHRINE 0.25% -1:200000 IJ SOLN
INTRAMUSCULAR | Status: DC | PRN
  Administered 2021-01-25: 30 mL

## 2021-01-25 MED ORDER — ASPIRIN 81 MG PO CHEW
81.0000 mg | CHEWABLE_TABLET | Freq: Two times a day (BID) | ORAL | Status: DC
  Administered 2021-01-25 – 2021-01-27 (×4): 81 mg via ORAL
  Filled 2021-01-25 (×4): qty 1

## 2021-01-25 MED ORDER — AMISULPRIDE (ANTIEMETIC) 5 MG/2ML IV SOLN: 10.0000 mg | Freq: Once | INTRAVENOUS | Status: DC | PRN

## 2021-01-25 MED ORDER — POVIDONE-IODINE 10 % EX SWAB
2.0000 "application " | Freq: Once | CUTANEOUS | Status: AC
  Administered 2021-01-25: 2 via TOPICAL

## 2021-01-25 MED ORDER — IRRISEPT - 450ML BOTTLE WITH 0.05% CHG IN STERILE WATER, USP 99.95% OPTIME
TOPICAL | Status: DC | PRN
  Administered 2021-01-25: 450 mL

## 2021-01-25 MED ORDER — CEFAZOLIN SODIUM-DEXTROSE 2-4 GM/100ML-% IV SOLN
2.0000 g | INTRAVENOUS | Status: AC
  Administered 2021-01-25: 2 g via INTRAVENOUS
  Filled 2021-01-25: qty 100

## 2021-01-25 MED ORDER — DIPHENHYDRAMINE HCL 12.5 MG/5ML PO ELIX
12.5000 mg | ORAL_SOLUTION | ORAL | Status: DC | PRN
  Administered 2021-01-26: 25 mg via ORAL
  Filled 2021-01-25: qty 10

## 2021-01-25 MED ORDER — OXYCODONE HCL ER 20 MG PO T12A
20.0000 mg | EXTENDED_RELEASE_TABLET | Freq: Two times a day (BID) | ORAL | Status: DC
  Administered 2021-01-25 – 2021-01-27 (×4): 20 mg via ORAL
  Filled 2021-01-25 (×4): qty 1

## 2021-01-25 MED ORDER — CHLORHEXIDINE GLUCONATE 0.12 % MT SOLN
15.0000 mL | Freq: Once | OROMUCOSAL | Status: AC
  Administered 2021-01-25: 15 mL via OROMUCOSAL

## 2021-01-25 MED ORDER — ALBUTEROL SULFATE (2.5 MG/3ML) 0.083% IN NEBU: 2.5000 mg | INHALATION_SOLUTION | Freq: Four times a day (QID) | RESPIRATORY_TRACT | Status: DC | PRN

## 2021-01-25 MED ORDER — MIDAZOLAM HCL 5 MG/5ML IJ SOLN
INTRAMUSCULAR | Status: DC | PRN
  Administered 2021-01-25: 2 mg via INTRAVENOUS

## 2021-01-25 MED ORDER — PROMETHAZINE HCL 25 MG/ML IJ SOLN: 6.2500 mg | INTRAMUSCULAR | Status: DC | PRN

## 2021-01-25 MED ORDER — MENTHOL 3 MG MT LOZG: 1.0000 | LOZENGE | OROMUCOSAL | Status: DC | PRN

## 2021-01-25 MED ORDER — DOCUSATE SODIUM 100 MG PO CAPS
100.0000 mg | ORAL_CAPSULE | Freq: Two times a day (BID) | ORAL | Status: DC
  Administered 2021-01-25 – 2021-01-27 (×4): 100 mg via ORAL
  Filled 2021-01-25 (×4): qty 1

## 2021-01-25 MED ORDER — ALUM & MAG HYDROXIDE-SIMETH 200-200-20 MG/5ML PO SUSP: 30.0000 mL | ORAL | Status: DC | PRN

## 2021-01-25 MED ORDER — ACETAMINOPHEN 325 MG PO TABS
325.0000 mg | ORAL_TABLET | Freq: Four times a day (QID) | ORAL | Status: DC | PRN
  Administered 2021-01-26 – 2021-01-27 (×4): 650 mg via ORAL
  Filled 2021-01-25 (×4): qty 2

## 2021-01-25 MED ORDER — ONDANSETRON HCL 4 MG/2ML IJ SOLN
INTRAMUSCULAR | Status: DC | PRN
  Administered 2021-01-25: 4 mg via INTRAVENOUS

## 2021-01-25 MED ORDER — WATER FOR IRRIGATION, STERILE IR SOLN
Status: DC | PRN
  Administered 2021-01-25: 2000 mL

## 2021-01-25 MED ORDER — SENNA 8.6 MG PO TABS
1.0000 | ORAL_TABLET | Freq: Two times a day (BID) | ORAL | Status: DC
  Administered 2021-01-25 – 2021-01-27 (×4): 8.6 mg via ORAL
  Filled 2021-01-25 (×4): qty 1

## 2021-01-25 MED ORDER — PHENOL 1.4 % MT LIQD: 1.0000 | OROMUCOSAL | Status: DC | PRN

## 2021-01-25 MED ORDER — LACTATED RINGERS IV SOLN: INTRAVENOUS | Status: DC

## 2021-01-25 MED ORDER — PROPOFOL 10 MG/ML IV BOLUS
INTRAVENOUS | Status: AC
  Filled 2021-01-25: qty 20

## 2021-01-25 MED ORDER — SODIUM CHLORIDE (PF) 0.9 % IJ SOLN
INTRAMUSCULAR | Status: DC | PRN
  Administered 2021-01-25: 30 mL

## 2021-01-25 SURGICAL SUPPLY — 63 items
BAG COUNTER SPONGE SURGICOUNT (BAG) IMPLANT
BAG DECANTER FOR FLEXI CONT (MISCELLANEOUS) IMPLANT
BAG ZIPLOCK 12X15 (MISCELLANEOUS) IMPLANT
CHLORAPREP W/TINT 26 (MISCELLANEOUS) ×2 IMPLANT
COVER PERINEAL POST (MISCELLANEOUS) ×2 IMPLANT
COVER SURGICAL LIGHT HANDLE (MISCELLANEOUS) ×2 IMPLANT
DECANTER SPIKE VIAL GLASS SM (MISCELLANEOUS) ×2 IMPLANT
DERMABOND ADVANCED (GAUZE/BANDAGES/DRESSINGS) ×1
DERMABOND ADVANCED .7 DNX12 (GAUZE/BANDAGES/DRESSINGS) ×1 IMPLANT
DRAPE IMP U-DRAPE 54X76 (DRAPES) ×2 IMPLANT
DRAPE SHEET LG 3/4 BI-LAMINATE (DRAPES) ×6 IMPLANT
DRAPE STERI IOBAN 125X83 (DRAPES) ×2 IMPLANT
DRAPE U-SHAPE 47X51 STRL (DRAPES) ×4 IMPLANT
DRESSING PREVENA PLUS CUSTOM (GAUZE/BANDAGES/DRESSINGS) ×1 IMPLANT
DRSG AQUACEL AG ADV 3.5X10 (GAUZE/BANDAGES/DRESSINGS) ×2 IMPLANT
DRSG PREVENA PLUS CUSTOM (GAUZE/BANDAGES/DRESSINGS) ×2
DRSG TEGADERM 8X12 (GAUZE/BANDAGES/DRESSINGS) ×2 IMPLANT
ELECT REM PT RETURN 15FT ADLT (MISCELLANEOUS) ×2 IMPLANT
EVACUATOR DRAINAGE 10X20 100CC (DRAIN) ×1 IMPLANT
EVACUATOR SILICONE 100CC (DRAIN) ×2
GAUZE SPONGE 4X4 12PLY STRL (GAUZE/BANDAGES/DRESSINGS) ×2 IMPLANT
GLOVE SRG 8 PF TXTR STRL LF DI (GLOVE) ×1 IMPLANT
GLOVE SURG ENC MOIS LTX SZ8.5 (GLOVE) ×4 IMPLANT
GLOVE SURG ENC TEXT LTX SZ7.5 (GLOVE) ×4 IMPLANT
GLOVE SURG UNDER POLY LF SZ8 (GLOVE) ×2
GLOVE SURG UNDER POLY LF SZ8.5 (GLOVE) ×2 IMPLANT
GOWN SPEC L3 XXLG W/TWL (GOWN DISPOSABLE) ×2 IMPLANT
GOWN STRL REUS W/TWL XL LVL3 (GOWN DISPOSABLE) ×2 IMPLANT
HANDPIECE INTERPULSE COAX TIP (DISPOSABLE) ×2
HEAD FEMORAL 32 CERAMIC (Hips) ×2 IMPLANT
HOLDER FOLEY CATH W/STRAP (MISCELLANEOUS) ×2 IMPLANT
HOOD PEEL AWAY FLYTE STAYCOOL (MISCELLANEOUS) ×8 IMPLANT
JET LAVAGE IRRISEPT WOUND (IRRIGATION / IRRIGATOR) ×2
KIT DRSG PREVENA PLUS 7DAY 125 (MISCELLANEOUS) ×2 IMPLANT
KIT TURNOVER KIT A (KITS) IMPLANT
LAVAGE JET IRRISEPT WOUND (IRRIGATION / IRRIGATOR) ×1 IMPLANT
LINER PINN ACET NEUT 32X52 (Liner) ×2 IMPLANT
MANIFOLD NEPTUNE II (INSTRUMENTS) ×2 IMPLANT
MARKER SKIN DUAL TIP RULER LAB (MISCELLANEOUS) ×2 IMPLANT
NDL SAFETY ECLIPSE 18X1.5 (NEEDLE) ×1 IMPLANT
NEEDLE HYPO 18GX1.5 SHARP (NEEDLE) ×2
NEEDLE SPNL 18GX3.5 QUINCKE PK (NEEDLE) ×2 IMPLANT
PACK ANTERIOR HIP CUSTOM (KITS) ×2 IMPLANT
PENCIL SMOKE EVACUATOR (MISCELLANEOUS) IMPLANT
PIN SECTOR W/GRIP ACE CUP 52MM (Hips) ×2 IMPLANT
SAW OSC TIP CART 19.5X105X1.3 (SAW) ×2 IMPLANT
SEALER BIPOLAR AQUA 6.0 (INSTRUMENTS) ×2 IMPLANT
SET HNDPC FAN SPRY TIP SCT (DISPOSABLE) ×1 IMPLANT
SPONGE DRAIN TRACH 4X4 STRL 2S (GAUZE/BANDAGES/DRESSINGS) ×2 IMPLANT
STAPLER INSORB 30 2030 C-SECTI (MISCELLANEOUS) IMPLANT
STEM TRI LOC BPS SZ8 W GRIPTON ×1 IMPLANT
SUT MNCRL AB 3-0 PS2 18 (SUTURE) ×2 IMPLANT
SUT MON AB 2-0 CT1 36 (SUTURE) ×2 IMPLANT
SUT STRATAFIX PDO 1 14 VIOLET (SUTURE) ×2
SUT STRATFX PDO 1 14 VIOLET (SUTURE) ×1
SUT VIC AB 2-0 CT1 27 (SUTURE)
SUT VIC AB 2-0 CT1 TAPERPNT 27 (SUTURE) IMPLANT
SUTURE STRATFX PDO 1 14 VIOLET (SUTURE) ×1 IMPLANT
SYR 3ML LL SCALE MARK (SYRINGE) ×2 IMPLANT
TRAY FOLEY MTR SLVR 16FR STAT (SET/KITS/TRAYS/PACK) IMPLANT
TRI LOC BPS SZ8 W GRIPTON ×2 IMPLANT
TUBE SUCTION HIGH CAP CLEAR NV (SUCTIONS) ×2 IMPLANT
WATER STERILE IRR 1000ML POUR (IV SOLUTION) ×2 IMPLANT

## 2021-01-25 NOTE — Anesthesia Postprocedure Evaluation (Signed)
Anesthesia Post Note  Patient: Misty [name]  Procedure(s) Performed: TOTAL HIP ARTHROPLASTY ANTERIOR APPROACH (Right: Hip)     Patient location during evaluation: PACU Anesthesia Type: MAC and Spinal Level of consciousness: awake and alert Pain management: pain level controlled Vital Signs Assessment: post-procedure vital signs reviewed and stable Respiratory status: spontaneous breathing and respiratory function stable Cardiovascular status: blood pressure returned to baseline and stable Postop Assessment: spinal receding Anesthetic complications: no   No notable events documented.  Last Vitals:  Vitals:   01/25/21 1215 01/25/21 1230  BP: 110/70 109/71  Pulse: 76 92  Resp: (!) 9 15  Temp:  36.4 C  SpO2: 100% 100%    Last Pain:  Vitals:   01/25/21 1230  TempSrc:   PainSc: 0-No pain    LLE Motor Response: Purposeful movement (01/25/21 1230) LLE Sensation: Decreased (01/25/21 1230) RLE Motor Response: Purposeful movement (01/25/21 1230) RLE Sensation: Decreased (01/25/21 1230) L Sensory Level: L3-Anterior knee, lower leg (01/25/21 1230) R Sensory Level: L3-Anterior knee, lower leg (01/25/21 1230)  [place]

## 2021-01-25 NOTE — Transfer of Care (Signed)
Immediate Anesthesia Transfer of Care Note  Patient: Misty [name]  Procedure(s) Performed: Procedure(s): TOTAL HIP ARTHROPLASTY ANTERIOR APPROACH (Right)  Patient Location: PACU  Anesthesia Type:Spinal  Level of Consciousness:  sedated, patient cooperative and responds to stimulation  Airway & Oxygen Therapy:Patient Spontanous Breathing and Patient connected to face mask oxgen  Post-op Assessment:  Report given to PACU RN and Post -op Vital signs reviewed and stable  Post vital signs:  Reviewed and stable  Last Vitals:  Vitals:   01/25/21 0645  BP: 111/66  Pulse: 92  Resp: 18  Temp: 36.8 C  SpO2: 419%    Complications: No apparent anesthesia complications

## 2021-01-25 NOTE — Evaluation (Addendum)
Physical Therapy Evaluation Patient Details Name: Misty [name] MRN: [id number] DOB: 1960/10/27 Today's Date: 01/25/2021  History of Present Illness  60 yo s/p R DA THA. PMH: L THA, colon CA, ACDF  Clinical Impression  Pt is s/p THA resulting in the deficits listed below (see PT Problem List).  Pt agreeable to OOB to chair. Limited by pain however pleased to be in recliner.   Pt will benefit from skilled PT to increase their independence and safety with mobility to allow discharge to the venue listed below.         Recommendations for follow up therapy are one component of a multi-disciplinary discharge planning process, led by the attending physician.  Recommendations may be updated based on patient status, additional functional criteria and insurance authorization.  Follow Up Recommendations Follow physician's recommendations for discharge plan and follow up therapies    Assistance Recommended at Discharge Intermittent Supervision/Assistance  Functional Status Assessment Patient has had a recent decline in their functional status and demonstrates the ability to make significant improvements in function in a reasonable and predictable amount of time.  Equipment Recommendations  None recommended by PT    Recommendations for Other Services       Precautions / Restrictions Precautions Precautions: Other (comment);Fall Precaution Comments: JP drain on R, incisional VAC R hip Restrictions Weight Bearing Restrictions: No Other Position/Activity Restrictions: WBAT      Mobility  Bed Mobility Overal bed mobility: Needs Assistance Bed Mobility: Supine to Sit     Supine to sit: Min assist     General bed mobility comments: assist to elevate trunk    Transfers Overall transfer level: Needs assistance Equipment used: Rolling walker (2 wheels) Transfers: Bed to chair/wheelchair/BSC     Step pivot transfers: Min assist       General transfer comment: cues for hand  placement, min assist to rise and transition to RW, min assist to balance and maneuver RW for pivot to chair    Ambulation/Gait               General Gait Details: NT d/t pain  Stairs            Wheelchair Mobility    Modified Rankin (Stroke Patients Only)       Balance                                             Pertinent Vitals/Pain Pain Assessment: 0-10 Pain Score: 8  Pain Location: back and R hip  Pain Descriptors / Indicators: Grimacing;Aching;Sore;Discomfort Pain Intervention(s): Limited activity within patient's tolerance;Monitored during session;Premedicated before session;Repositioned; ice to right hip, hot packs to R lower back    Home Living Family/patient expects to be discharged to:: Private residence   Available Help at Discharge: Family;Available PRN/intermittently Type of Home: House Home Access: Other (comment)   Entrance Stairs-Number of Steps: 1 small step   Home Layout: One level Home Equipment: Conservation officer, nature (2 wheels);Rollator (4 wheels);Cane - single point;Toilet riser Additional Comments: has a step stool to get into her bed    Prior Function Prior Level of Function : Independent/Modified Independent       Physical Assist : Mobility (physical) Mobility (physical): Gait   Mobility Comments: amb with cane      Hand Dominance        Extremity/Trunk Assessment   Upper Extremity Assessment Upper Extremity  Assessment: Overall WFL for tasks assessed    Lower Extremity Assessment Lower Extremity Assessment: RLE deficits/detail RLE Deficits / Details: ankle WFL RLE: Unable to fully assess due to pain       Communication   Communication: No difficulties  Cognition Arousal/Alertness: Awake/alert Behavior During Therapy: WFL for tasks assessed/performed Overall Cognitive Status: Within Functional Limits for tasks assessed                                          General Comments       Exercises Total Joint Exercises Ankle Circles/Pumps: AROM;Both;10 reps   Assessment/Plan    PT Assessment Patient needs continued PT services  PT Problem List Decreased strength;Decreased mobility;Decreased range of motion;Decreased activity tolerance;Pain       PT Treatment Interventions DME instruction;Therapeutic activities;Gait training;Therapeutic exercise;Patient/family education;Functional mobility training;Stair training    PT Goals (Current goals can be found in the Care Plan section)  Acute Rehab PT Goals Patient Stated Goal: home, wants HHPT PT Goal Formulation: With patient Time For Goal Achievement: 02/01/21 Potential to Achieve Goals: Good    Frequency 7X/week   Barriers to discharge        Co-evaluation               AM-PAC PT "6 Clicks" Mobility  Outcome Measure Help needed turning from your back to your side while in a flat bed without using bedrails?: A Little Help needed moving from lying on your back to sitting on the side of a flat bed without using bedrails?: A Little Help needed moving to and from a bed to a chair (including a wheelchair)?: A Little Help needed standing up from a chair using your arms (e.g., wheelchair or bedside chair)?: A Little Help needed to walk in hospital room?: A Lot Help needed climbing 3-5 steps with a railing? : Total 6 Click Score: 15    End of Session Equipment Utilized During Treatment: Gait belt Activity Tolerance: Patient tolerated treatment well Patient left: with call bell/phone within reach;in chair;with chair alarm set Nurse Communication: Mobility status PT Visit Diagnosis: Other abnormalities of gait and mobility (R26.89);Difficulty in walking, not elsewhere classified (R26.2)    Time:  PT Time Calculation (min) (ACUTE ONLY):    Charges:   PT Evaluation $PT Eval Low Complexity: 1 Low TA          Toula Miyasaki, PT  Acute Rehab Dept ([facility]) [phone] Pager  [phone]  01/25/2021   [facility] 01/25/2021, 3:04 PM

## 2021-01-25 NOTE — Anesthesia Procedure Notes (Signed)
Spinal  Patient location during procedure: OR Start time: 01/25/2021 8:29 AM End time: 01/25/2021 8:37 AM Reason for block: surgical anesthesia Staffing Performed: anesthesiologist  Anesthesiologist: Duane Boston, MD Preanesthetic Checklist Completed: patient identified, IV checked, risks and benefits discussed, surgical consent, monitors and equipment checked, pre-op evaluation and timeout performed Spinal Block Patient position: sitting Prep: DuraPrep Patient monitoring: cardiac monitor, continuous pulse ox and blood pressure Approach: midline Location: L2-3 Injection technique: single-shot Needle Needle type: Pencan  Needle gauge: 24 G Needle length: 9 cm Assessment Events: CSF return Additional Notes Functioning IV was confirmed and monitors were applied. Sterile prep and drape, including hand hygiene and sterile gloves were used. The patient was positioned and the spine was prepped. The skin was anesthetized with lidocaine.  Free flow of clear CSF was obtained prior to injecting local anesthetic into the CSF.  The spinal needle aspirated freely following injection.  The needle was carefully withdrawn.  The patient tolerated the procedure well.

## 2021-01-25 NOTE — Interval H&P Note (Signed)
History and Physical Interval Note:  01/25/2021 8:26 AM  Misty [name]  has presented today for surgery, with the diagnosis of Right hip osteoarthritis.  The various methods of treatment have been discussed with the patient and family. After consideration of risks, benefits and other options for treatment, the patient has consented to  Procedure(s): TOTAL HIP ARTHROPLASTY ANTERIOR APPROACH (Right) as a surgical intervention.  The patient's history has been reviewed, patient examined, no change in status, stable for surgery.  I have reviewed the patient's chart and labs.  Questions were answered to the patient's satisfaction.     Hilton Cork Averly Ericson

## 2021-01-25 NOTE — Discharge Instructions (Addendum)
 Dr. Brian Swinteck Joint Replacement Specialist [facility] [address] [place], [place] [address] [phone]   TOTAL HIP REPLACEMENT POSTOPERATIVE DIRECTIONS    Hip Rehabilitation, Guidelines Following Surgery   WEIGHT BEARING Weight bearing as tolerated with assist device (walker, cane, etc) as directed, use it as long as suggested by your surgeon or therapist, typically at least 4-6 weeks.  The results of a hip operation are greatly improved after range of motion and muscle strengthening exercises. Follow all safety measures which are given to protect your hip. If any of these exercises cause increased pain or swelling in your joint, decrease the amount until you are comfortable again. Then slowly increase the exercises. Call your caregiver if you have problems or questions.   HOME CARE INSTRUCTIONS  Most of the following instructions are designed to prevent the dislocation of your new hip.  Remove items at home which could result in a fall. This includes throw rugs or furniture in walking pathways.  Continue medications as instructed at time of discharge. You may have some home medications which will be placed on hold until you complete the course of blood thinner medication. You may start showering once you are discharged home. Do not remove your dressing. Do not put on socks or shoes without following the instructions of your caregivers.   Sit on chairs with arms. Use the chair arms to help push yourself up when arising.  Arrange for the use of a toilet seat elevator so you are not sitting low.  Walk with walker as instructed.  You may resume a sexual relationship in one month or when given the OK by your caregiver.  Use walker as long as suggested by your caregivers.  You may put full weight on your legs and walk as much as is comfortable. Avoid periods of inactivity such as sitting longer than an hour when not asleep. This helps prevent blood  clots.  You may return to work once you are cleared by your surgeon.  Do not drive a car for 6 weeks or until released by your surgeon.  Do not drive while taking narcotics.  Wear elastic stockings for two weeks following surgery during the day but you may remove then at night.  Make sure you keep all of your appointments after your operation with all of your doctors and caregivers. You should call the office at the above phone number and make an appointment for approximately two weeks after the date of your surgery. Please pick up a stool softener and laxative for home use as long as you are requiring pain medications. ICE to the affected hip every three hours for 30 minutes at a time and then as needed for pain and swelling. Continue to use ice on the hip for pain and swelling from surgery. You may notice swelling that will progress down to the foot and ankle.  This is normal after surgery.  Elevate the leg when you are not up walking on it.   It is important for you to complete the blood thinner medication as prescribed by your doctor. Continue to use the breathing machine which will help keep your temperature down.  It is common for your temperature to cycle up and down following surgery, especially at night when you are not up moving around and exerting yourself.  The breathing machine keeps your lungs expanded and your temperature down.  RANGE OF MOTION AND STRENGTHENING EXERCISES  These exercises are designed to help you   keep full movement of your hip joint. Follow your caregiver's or physical therapist's instructions. Perform all exercises about fifteen times, three times per day or as directed. Exercise both hips, even if you have had only one joint replacement. These exercises can be done on a training (exercise) mat, on the floor, on a table or on a bed. Use whatever works the best and is most comfortable for you. Use music or television while you are exercising so that the exercises are a  pleasant break in your day. This will make your life better with the exercises acting as a break in routine you can look forward to.  ?Lying on your back, slowly slide your foot toward your buttocks, raising your knee up off the floor. Then slowly slide your foot back down until your leg is straight again.  ?Lying on your back spread your legs as far apart as you can without causing discomfort.  ?Lying on your side, raise your upper leg and foot straight up from the floor as far as is comfortable. Slowly lower the leg and repeat.  ?Lying on your back, tighten up the muscle in the front of your thigh (quadriceps muscles). You can do this by keeping your leg straight and trying to raise your heel off the floor. This helps strengthen the largest muscle supporting your knee.  ?Lying on your back, tighten up the muscles of your buttocks both with the legs straight and with the knee bent at a comfortable angle while keeping your heel on the floor.  ? ?SKILLED REHAB INSTRUCTIONS: ?If the patient is transferred to a skilled rehab facility following release from the hospital, a list of the current medications will be sent to the facility for the patient to continue.  When discharged from the skilled rehab facility, please have the facility set up the patient's Home Health Physical Therapy prior to being released. Also, the skilled facility will be responsible for providing the patient with their medications at time of release from the facility to include their pain medication and their blood thinner medication. If the patient is still at the rehab facility at time of the two week follow up appointment, the skilled rehab facility will also need to assist the patient in arranging follow up appointment in our office and any transportation needs. ? ?POST-OPERATIVE OPIOID TAPER INSTRUCTIONS: ?It is important to wean off of your opioid medication as soon as possible. If you do not need pain medication after your surgery it is ok  to stop day one. ?Opioids include: ?Codeine, Hydrocodone(Norco, Vicodin), Oxycodone(Percocet, oxycontin) and hydromorphone amongst others.  ?Long term and even short term use of opiods can cause: ?Increased pain response ?Dependence ?Constipation ?Depression ?Respiratory depression ?And more.  ?Withdrawal symptoms can include ?Flu like symptoms ?Nausea, vomiting ?And more ?Techniques to manage these symptoms ?Hydrate well ?Eat regular healthy meals ?Stay active ?Use relaxation techniques(deep breathing, meditating, yoga) ?Do Not substitute Alcohol to help with tapering ?If you have been on opioids for less than two weeks and do not have pain than it is ok to stop all together.  ?Plan to wean off of opioids ?This plan should start within one week post op of your joint replacement. ?Maintain the same interval or time between taking each dose and first decrease the dose.  ?Cut the total daily intake of opioids by one tablet each day ?Next start to increase the time between doses. ?The last dose that should be eliminated is the evening dose.  ? ? ?MAKE   SURE YOU:  Understand these instructions.  Will watch your condition.  Will get help right away if you are not doing well or get worse.  Pick up stool softner and laxative for home use following surgery while on pain medications. Do not remove your dressing. Charge the suction unit every night Empty the drain every 8 hours. Keep a log with output numbers and bring to your appointment Continue to use ice for pain and swelling after surgery. Do not use any lotions or creams on the incision until instructed by your surgeon. Total Hip Protocol.

## 2021-01-25 NOTE — Care Plan (Signed)
Ortho Bundle Case Management Note  Patient Details  Name: Misty [name] MRN: [id number] Date of Birth: Jun 17, 1960  R THA on 01-25-21 DCP:  Home with dtr.  2 story home with 3 ste. DME:  No needs.  Has a RW, 3-in-1, and shower chair. PT:  HEP                   DME Arranged:  N/A DME Agency:  NA  HH Arranged:  NA HH Agency:  NA  Additional Comments: Please contact me with any questions of if this plan should need to change.  Marianne Sofia, RN,CCM [facility]  [phone] 01/25/2021, 6:38 AM

## 2021-01-25 NOTE — Op Note (Signed)
OPERATIVE REPORT  SURGEON: Rod Can, MD   ASSISTANT: Cherlynn June, PA-C.  PREOPERATIVE DIAGNOSIS: Right hip avascular necrosis.   POSTOPERATIVE DIAGNOSIS: Right hip avascular necrosis.   PROCEDURE: Right total hip arthroplasty, anterior approach.   IMPLANTS: DePuy Tri Lock stem, size 8, hi offset. DePuy Pinnacle Cup, size 52 mm. DePuy Altrx liner, size 32 by 52 mm, neutral. DePuy Biolox ceramic head ball, size 32 + 1 mm.  ANESTHESIA:  MAC and Spinal  ESTIMATED BLOOD LOSS:-250 mL    ANTIBIOTICS: 1 g vancomycin (previous wound healing issues). 2 g Ancef.  DRAINS: None.  COMPLICATIONS: None.   CONDITION: PACU - hemodynamically stable.   BRIEF CLINICAL NOTE: Misty [name] is a 60 y.o. female with a long-standing history of Right hip avascular necrosis. After failing conservative management, the patient was indicated for total hip arthroplasty. The risks, benefits, and alternatives to the procedure were explained, and the patient elected to proceed.  PROCEDURE IN DETAIL: Surgical site was marked by myself in the pre-op holding area. Once inside the operating room, spinal anesthesia was obtained, and a foley catheter was inserted. The patient was then positioned on the Hana table.  All bony prominences were well padded.  The hip was prepped and draped in the normal sterile surgical fashion.  A time-out was called verifying side and site of surgery. The patient received IV antibiotics within 60 minutes of beginning the procedure.   Bikini incision was created three finger breadths distal to the ASIS, taking care to stay lateral to the medial border of the ASIS. The direct anterior approach to the hip was performed through the Hueter interval.  Lateral femoral circumflex vessels were treated with the Auqumantys. The anterior capsule was exposed and an inverted T capsulotomy was made. The femoral neck cut was made to the level of the templated cut.  A corkscrew was placed into  the head and the head was removed.  The femoral head was found to have eburnated bone. The head was passed to the back table and was measured. Pubofemoral ligament was released off of the calcar, taking care to stay on bone. Superior capsule was released from the greater trochanter, taking care to stay lateral to the posterior border of the femoral neck in order to preserve the short external rotators.   Acetabular exposure was achieved, and the pulvinar and labrum were excised. Sequential reaming of the acetabulum was then performed up to a size 51 mm reamer under direct visulization. A 52 mm cup was then opened and impacted into place at approximately 40 degrees of abduction and 20 degrees of anteversion. The final polyethylene liner was impacted into place and acetabular osteophytes were removed.    I then gained femoral exposure taking care to protect the abductors and greater trochanter.  This was performed using standard external rotation, extension, and adduction.  A cookie cutter was used to enter the femoral canal, and then the femoral canal finder was placed.  Sequential broaching was performed up to a size 8.  Calcar planer was used on the femoral neck remnant.  I placed a hi offset neck and a trial head ball.  The hip was reduced.  Leg lengths and offset were checked fluoroscopically.  The hip was dislocated and trial components were removed.  The final implants were placed, and the hip was reduced.  Fluoroscopy was used to confirm component position and leg lengths.  At 90 degrees of external rotation and full extension, the hip was stable to an  anterior directed force.   The wound was copiously irrigated with Irrisept solution and normal saline using pule lavage.  Marcaine solution was injected into the periarticular soft tissue.  The fascia was closed with #1 Stratafix. A stab incision was made distally over the lateral thigh, and a JP drain was placed in the subcutaneous tissue. The wound was  closed in layers using 2-0 Vicryl for the subcutaneous fat, 2-0 Monocryl for the deep dermal layer, and staples for the skin.  The drain was sewn in with 3-0 nylon.  A custom Prevena negative pressure incisional dressing was placed and hooked up to a wound VAC at 125 mmHg of suction. There was no leak. The patient was transported to the recovery room in stable condition.  Sponge, needle, and instrument counts were correct at the end of the case x2.  The patient tolerated the procedure well and there were no known complications.  Please note that a surgical assistant was a medical necessity for this procedure to perform it in a safe and expeditious manner. Assistant was necessary to provide appropriate retraction of vital neurovascular structures, to prevent femoral fracture, and to allow for anatomic placement of the prosthesis.

## 2021-01-26 DIAGNOSIS — G35 Multiple sclerosis: Secondary | ICD-10-CM | POA: Diagnosis not present

## 2021-01-26 DIAGNOSIS — I10 Essential (primary) hypertension: Secondary | ICD-10-CM | POA: Diagnosis not present

## 2021-01-26 DIAGNOSIS — Z79899 Other long term (current) drug therapy: Secondary | ICD-10-CM | POA: Diagnosis not present

## 2021-01-26 DIAGNOSIS — R7303 Prediabetes: Secondary | ICD-10-CM | POA: Diagnosis not present

## 2021-01-26 DIAGNOSIS — Z96642 Presence of left artificial hip joint: Secondary | ICD-10-CM | POA: Diagnosis not present

## 2021-01-26 DIAGNOSIS — Z85038 Personal history of other malignant neoplasm of large intestine: Secondary | ICD-10-CM | POA: Diagnosis not present

## 2021-01-26 DIAGNOSIS — Z7984 Long term (current) use of oral hypoglycemic drugs: Secondary | ICD-10-CM | POA: Diagnosis not present

## 2021-01-26 DIAGNOSIS — M87051 Idiopathic aseptic necrosis of right femur: Secondary | ICD-10-CM | POA: Diagnosis not present

## 2021-01-26 DIAGNOSIS — Z8616 Personal history of COVID-19: Secondary | ICD-10-CM | POA: Diagnosis not present

## 2021-01-26 DIAGNOSIS — Z87891 Personal history of nicotine dependence: Secondary | ICD-10-CM | POA: Diagnosis not present

## 2021-01-26 LAB — BASIC METABOLIC PANEL
Anion gap: 4 — ABNORMAL LOW (ref 5–15)
Anion gap: 6 (ref 5–15)
BUN: 43 mg/dL — ABNORMAL HIGH (ref 6–20)
BUN: 37 mg/dL — ABNORMAL HIGH (ref 6–20)
CO2: 22 mmol/L (ref 22–32)
CO2: 21 mmol/L — ABNORMAL LOW (ref 22–32)
Calcium: 8.9 mg/dL (ref 8.9–10.3)
Calcium: 8.9 mg/dL (ref 8.9–10.3)
Chloride: 110 mmol/L (ref 98–111)
Chloride: 110 mmol/L (ref 98–111)
Creatinine, Ser: 1.21 mg/dL — ABNORMAL HIGH (ref 0.44–1.00)
Creatinine, Ser: 1.04 mg/dL — ABNORMAL HIGH (ref 0.44–1.00)
GFR, Estimated: 52 mL/min — ABNORMAL LOW (ref >60)
GFR, Estimated: >60 mL/min (ref >60)
Glucose, Bld: 158 mg/dL — ABNORMAL HIGH (ref 70–99)
Glucose, Bld: 158 mg/dL — ABNORMAL HIGH (ref 70–99)
Potassium: 6 mmol/L — ABNORMAL HIGH (ref 3.5–5.1)
Potassium: 5.3 mmol/L — ABNORMAL HIGH (ref 3.5–5.1)
Sodium: 136 mmol/L (ref 135–145)
Sodium: 137 mmol/L (ref 135–145)

## 2021-01-26 LAB — GLUCOSE, CAPILLARY
Glucose-Capillary: 132 mg/dL — ABNORMAL HIGH (ref 70–99)
Glucose-Capillary: 113 mg/dL — ABNORMAL HIGH (ref 70–99)
Glucose-Capillary: 145 mg/dL — ABNORMAL HIGH (ref 70–99)
Glucose-Capillary: 203 mg/dL — ABNORMAL HIGH (ref 70–99)

## 2021-01-26 LAB — CBC
HCT: 30.8 % — ABNORMAL LOW (ref 36.0–46.0)
Hemoglobin: 9.3 g/dL — ABNORMAL LOW (ref 12.0–15.0)
MCH: 29.9 pg (ref 26.0–34.0)
MCHC: 30.2 g/dL (ref 30.0–36.0)
MCV: 99 fL (ref 80.0–100.0)
Platelets: 153 10*3/uL (ref 150–400)
RBC: 3.11 MIL/uL — ABNORMAL LOW (ref 3.87–5.11)
RDW: 13.6 % (ref 11.5–15.5)
WBC: 9.1 10*3/uL (ref 4.0–10.5)
nRBC: 0 % (ref 0.0–0.2)

## 2021-01-26 MED ORDER — SODIUM CHLORIDE 0.9 % IV SOLN: INTRAVENOUS | Status: DC

## 2021-01-26 NOTE — TOC Transition Note (Signed)
Transition of Care [facility]) - CM/SW Discharge Note   Patient Details  Name: Misty [name] MRN: [id number] Date of Birth: Dec 24, 1960  [facility] CM/SW Contact:  Lennart Pall, LCSW Phone Number: 01/26/2021, 2:31 PM   Clinical Narrative:    Met wit pt today who confirms that she has all needed DME at home.  She is requesting HHPT follow up which Dr. Lyla Glassing has approved.  No agency preference - referral placed with [facility]  No further TOC needs.   Final next level of care: [place] Barriers to Discharge: No Barriers Identified   Patient Goals and CMS Choice Patient states their goals for this hospitalization and ongoing recovery are:: return home      Discharge Placement                       Discharge Plan and Services                DME Arranged: N/A DME Agency: NA       HH Arranged: PT HH Agency: [place] Date [place]: 01/26/21 Time [place]: 0349 Representative spoke with at [place]: [place] ([place]) Interventions     Readmission Risk Interventions No flowsheet data found.

## 2021-01-26 NOTE — Progress Notes (Signed)
Misty [name]/ Dr. Lyla Glassing: The patient has expressed concern over doing the HEP at d/c.  She shared that, after her last surgery, she struggled with completing the HEP d/t difficulty understanding progression goals.  She is requesting at least OP PT/OT at discharge.  Thank you.

## 2021-01-26 NOTE — Progress Notes (Signed)
    Subjective:  Patient reports pain as mild to moderate.  Denies N/V/CP/SOB. Orthostatic hypotension with PT earlier.  Objective:   VITALS:   Vitals:   01/25/21 2118 01/26/21 0006 01/26/21 0632 01/26/21 0936  BP: 116/76 106/63 119/77   Pulse: 89 82 83 96  Resp: 16 16 16 17   Temp: 97.8 F (36.6 C) 98 F (36.7 C) 98.2 F (36.8 C)   TempSrc: Oral Oral Oral   SpO2: 98% 98% 99% 97%  Weight:      Height:       JP: 135 cc yesterday   NAD ABD soft Sensation intact distally Intact pulses distally Dorsiflexion/Plantar flexion intact iVAC: intact without leak JP: scant ss  Lab Results  Component Value Date   WBC 9.1 01/26/2021   HGB 9.3 (L) 01/26/2021   HCT 30.8 (L) 01/26/2021   MCV 99.0 01/26/2021   PLT 153 01/26/2021   BMET    Component Value Date/Time   NA 136 01/26/2021 0259   K 6.0 (H) 01/26/2021 0259   CL 110 01/26/2021 0259   CO2 22 01/26/2021 0259   GLUCOSE 158 (H) 01/26/2021 0259   BUN 43 (H) 01/26/2021 0259   CREATININE 1.21 (H) 01/26/2021 0259   CREATININE 0.51 08/21/2013 1337   CALCIUM 8.9 01/26/2021 0259   GFRNONAA 52 (L) 01/26/2021 0259   GFRNONAA >89 08/03/2013 1127     Assessment/Plan: 1 Day Post-Op   Principal Problem:   Osteoarthritis of right hip Active Problems:   Avascular necrosis of femoral head, right (HCC)   WBAT with walker DVT ppx: Aspirin, SCDs, TEDS PO pain control PT/OT Hyperkalemia: will restart IVFs and give home lasix dose, recheck this afternoon Continue incisional VAC, will switch to portable Prevena unit upon d/c Continue JP drain Dispo: plan for d/c home tomorrow with HHPT, will d/c with portable negative pressure dressing (at bedside) and JP drain   [place] Cork Kariyah Baugh 01/26/2021, 9:54 AM   Rod Can, MD [phone] [place] is now [facility]  [place] [address], [address], [place], [place] [address] Phone: [phone] [email or web address] Facebook  [employer]

## 2021-01-26 NOTE — Progress Notes (Signed)
Physical Therapy Treatment Patient Details Name: Misty [name] MRN: [id number] DOB: [birth year] Today's Date: 01/26/2021   History of Present Illness 60 yo s/p R DA THA. PMH: L THA, colon CA, ACDF    PT Comments    Pt very cooperative and up to ambulate in halls and then to bathroom.  Will initiate therex program this pm.   Recommendations for follow up therapy are one component of a multi-disciplinary discharge planning process, led by the attending physician.  Recommendations may be updated based on patient status, additional functional criteria and insurance authorization.  Follow Up Recommendations  Follow physician's recommendations for discharge plan and follow up therapies     Assistance Recommended at Discharge Intermittent Supervision/Assistance  Equipment Recommendations  None recommended by PT    Recommendations for Other Services       Precautions / Restrictions Precautions Precaution Comments: JP drain on R, incisional VAC R hip Restrictions Weight Bearing Restrictions: No Other Position/Activity Restrictions: WBAT     Mobility  Bed Mobility               General bed mobility comments: Pt up on side of bed with nursing    Transfers Overall transfer level: Needs assistance Equipment used: Rolling walker (2 wheels) Transfers: Sit to/from Stand Sit to Stand: Min assist           General transfer comment: cues for hand placement, min assist to rise and transition to RW    Ambulation/Gait Ambulation/Gait assistance: Min assist;Min guard Gait Distance (Feet): 111 Feet Assistive device: Rolling walker (2 wheels) Gait Pattern/deviations: Step-to pattern;Decreased step length - right;Decreased step length - left;Shuffle;Trunk flexed Gait velocity: decr     General Gait Details: INcreased time with cues for posture, position from RW and initial sequence   Stairs             Wheelchair Mobility    Modified Rankin (Stroke Patients  Only)       Balance Overall balance assessment: Needs assistance Sitting-balance support: No upper extremity supported;Feet supported Sitting balance-Leahy Scale: Good     Standing balance support: Bilateral upper extremity supported Standing balance-Leahy Scale: Fair Standing balance comment: pt dons mask with bil hands in standing                            Cognition Arousal/Alertness: Awake/alert Behavior During Therapy: WFL for tasks assessed/performed Overall Cognitive Status: Within Functional Limits for tasks assessed                                          Exercises      General Comments        Pertinent Vitals/Pain Pain Assessment: 0-10 Pain Score: 6  Pain Location: back and R hip Pain Descriptors / Indicators: Grimacing;Aching;Sore;Discomfort Pain Intervention(s): Limited activity within patient's tolerance;Premedicated before session;Monitored during session    Home Living                          Prior Function            PT Goals (current goals can now be found in the care plan section) Acute Rehab PT Goals Patient Stated Goal: home, wants HHPT PT Goal Formulation: With patient Time For Goal Achievement: 02/01/21 Potential to Achieve Goals: Good Progress towards PT goals: Progressing toward  goals    Frequency    7X/week      PT Plan Current plan remains appropriate    Co-evaluation              AM-PAC PT "6 Clicks" Mobility   Outcome Measure  Help needed turning from your back to your side while in a flat bed without using bedrails?: A Little Help needed moving from lying on your back to sitting on the side of a flat bed without using bedrails?: A Little Help needed moving to and from a bed to a chair (including a wheelchair)?: A Little Help needed standing up from a chair using your arms (e.g., wheelchair or bedside chair)?: A Little Help needed to walk in hospital room?: A Little Help  needed climbing 3-5 steps with a railing? : Total 6 Click Score: 16    End of Session Equipment Utilized During Treatment: Gait belt Activity Tolerance: Patient tolerated treatment well Patient left: with call bell/phone within reach;Other (comment) (bathroom) Nurse Communication: Mobility status PT Visit Diagnosis: Other abnormalities of gait and mobility (R26.89);Difficulty in walking, not elsewhere classified (R26.2)     Time: 5093-2671 PT Time Calculation (min) (ACUTE ONLY): 25 min  Charges:  $Gait Training: 23-37 mins                     [place] Pager [phone] Office [phone]    Tina Gruner 01/26/2021, 9:48 AM

## 2021-01-26 NOTE — Progress Notes (Signed)
Physical Therapy Treatment Patient Details Name: Misty [name] MRN: [id number] DOB: [birth year] Today's Date: 01/26/2021   History of Present Illness 60 yo s/p R DA THA. PMH: L THA, colon CA, ACDF    PT Comments    Pt very cooperative and progressing steadily with mobility including ambulation increased distance in hall and requiring decreased assist for most task.   Recommendations for follow up therapy are one component of a multi-disciplinary discharge planning process, led by the attending physician.  Recommendations may be updated based on patient status, additional functional criteria and insurance authorization.  Follow Up Recommendations  Follow physician's recommendations for discharge plan and follow up therapies     Assistance Recommended at Discharge Intermittent Supervision/Assistance  Equipment Recommendations  None recommended by PT    Recommendations for Other Services       Precautions / Restrictions Precautions Precaution Comments: JP drain on R, incisional VAC R hip Restrictions Weight Bearing Restrictions: No Other Position/Activity Restrictions: WBAT     Mobility  Bed Mobility Overal bed mobility: Needs Assistance Bed Mobility: Supine to Sit     Supine to sit: Min guard     General bed mobility comments: cues for sequence, use of L LE to self assist and use of gait belt to assist R LE    Transfers Overall transfer level: Needs assistance Equipment used: Rolling walker (2 wheels) Transfers: Sit to/from Stand Sit to Stand: Min guard           General transfer comment: cues for hand placement and LE management    Ambulation/Gait Ambulation/Gait assistance: Min guard Gait Distance (Feet): 150 Feet Assistive device: Rolling walker (2 wheels) Gait Pattern/deviations: Decreased step length - right;Decreased step length - left;Shuffle;Trunk flexed;Step-to pattern;Step-through pattern Gait velocity: decr     General Gait Details:  INcreased time with cues for posture, position from RW and initial sequence   Stairs             Wheelchair Mobility    Modified Rankin (Stroke Patients Only)       Balance Overall balance assessment: Needs assistance Sitting-balance support: No upper extremity supported;Feet supported Sitting balance-Leahy Scale: Good     Standing balance support: Bilateral upper extremity supported Standing balance-Leahy Scale: Fair Standing balance comment: pt dons mask with bil hands in standing                            Cognition Arousal/Alertness: Awake/alert Behavior During Therapy: WFL for tasks assessed/performed Overall Cognitive Status: Within Functional Limits for tasks assessed                                          Exercises Total Joint Exercises Ankle Circles/Pumps: AROM;Both;15 reps;Supine Quad Sets: AROM;Both;10 reps;Supine Heel Slides: AAROM;Both;20 reps;Supine Hip ABduction/ADduction: AAROM;Left;15 reps;Supine    General Comments        Pertinent Vitals/Pain Pain Assessment: 0-10 Pain Score: 4  Pain Location: back and R hip Pain Descriptors / Indicators: Grimacing;Aching;Sore;Discomfort Pain Intervention(s): Monitored during session;Limited activity within patient's tolerance;Premedicated before session    Home Living                          Prior Function            PT Goals (current goals can now be found in the care  plan section) Acute Rehab PT Goals Patient Stated Goal: home, wants HHPT PT Goal Formulation: With patient Time For Goal Achievement: 02/01/21 Potential to Achieve Goals: Good Progress towards PT goals: Progressing toward goals    Frequency    7X/week      PT Plan Current plan remains appropriate    Co-evaluation              AM-PAC PT "6 Clicks" Mobility   Outcome Measure  Help needed turning from your back to your side while in a flat bed without using bedrails?: A  Little Help needed moving from lying on your back to sitting on the side of a flat bed without using bedrails?: A Little Help needed moving to and from a bed to a chair (including a wheelchair)?: A Little Help needed standing up from a chair using your arms (e.g., wheelchair or bedside chair)?: A Little Help needed to walk in hospital room?: A Little Help needed climbing 3-5 steps with a railing? : A Lot 6 Click Score: 17    End of Session Equipment Utilized During Treatment: Gait belt Activity Tolerance: Patient tolerated treatment well Patient left: with call bell/phone within reach;Other (comment) Nurse Communication: Mobility status PT Visit Diagnosis: Other abnormalities of gait and mobility (R26.89);Difficulty in walking, not elsewhere classified (R26.2)     Time: 7253-6644 PT Time Calculation (min) (ACUTE ONLY): 32 min  Charges:  $Gait Training: 8-22 mins $Therapeutic Exercise: 8-22 mins                     Debe Coder PT Acute Rehabilitation Services Pager [phone] Office [phone]    Jeric Slagel 01/26/2021, 2:33 PM

## 2021-01-26 NOTE — Plan of Care (Signed)
Plan of care reviewed and discussed with the patient. 

## 2021-01-27 DIAGNOSIS — Z96642 Presence of left artificial hip joint: Secondary | ICD-10-CM | POA: Diagnosis not present

## 2021-01-27 DIAGNOSIS — I10 Essential (primary) hypertension: Secondary | ICD-10-CM | POA: Diagnosis not present

## 2021-01-27 DIAGNOSIS — Z79899 Other long term (current) drug therapy: Secondary | ICD-10-CM | POA: Diagnosis not present

## 2021-01-27 DIAGNOSIS — Z8616 Personal history of COVID-19: Secondary | ICD-10-CM | POA: Diagnosis not present

## 2021-01-27 DIAGNOSIS — Z7984 Long term (current) use of oral hypoglycemic drugs: Secondary | ICD-10-CM | POA: Diagnosis not present

## 2021-01-27 DIAGNOSIS — M87051 Idiopathic aseptic necrosis of right femur: Secondary | ICD-10-CM | POA: Diagnosis not present

## 2021-01-27 DIAGNOSIS — Z87891 Personal history of nicotine dependence: Secondary | ICD-10-CM | POA: Diagnosis not present

## 2021-01-27 DIAGNOSIS — R7303 Prediabetes: Secondary | ICD-10-CM | POA: Diagnosis not present

## 2021-01-27 DIAGNOSIS — G35 Multiple sclerosis: Secondary | ICD-10-CM | POA: Diagnosis not present

## 2021-01-27 DIAGNOSIS — Z85038 Personal history of other malignant neoplasm of large intestine: Secondary | ICD-10-CM | POA: Diagnosis not present

## 2021-01-27 LAB — CBC
HCT: 30.8 % — ABNORMAL LOW (ref 36.0–46.0)
Hemoglobin: 9.3 g/dL — ABNORMAL LOW (ref 12.0–15.0)
MCH: 29.7 pg (ref 26.0–34.0)
MCHC: 30.2 g/dL (ref 30.0–36.0)
MCV: 98.4 fL (ref 80.0–100.0)
Platelets: 161 10*3/uL (ref 150–400)
RBC: 3.13 MIL/uL — ABNORMAL LOW (ref 3.87–5.11)
RDW: 13.8 % (ref 11.5–15.5)
WBC: 9.5 10*3/uL (ref 4.0–10.5)
nRBC: 0 % (ref 0.0–0.2)

## 2021-01-27 LAB — GLUCOSE, CAPILLARY: Glucose-Capillary: 101 mg/dL — ABNORMAL HIGH (ref 70–99)

## 2021-01-27 MED ORDER — SENNA 8.6 MG PO TABS: 1.0000 | ORAL_TABLET | Freq: Two times a day (BID) | ORAL | 0 refills | Status: DC

## 2021-01-27 MED ORDER — ASPIRIN 81 MG PO CHEW: 81.0000 mg | CHEWABLE_TABLET | Freq: Two times a day (BID) | ORAL | Status: AC

## 2021-01-27 MED ORDER — OXYCODONE HCL 20 MG PO TABS: 1.0000 | ORAL_TABLET | Freq: Four times a day (QID) | ORAL | 0 refills | Status: AC | PRN

## 2021-01-27 NOTE — Discharge Summary (Signed)
Physician Discharge Summary  Patient ID: Misty [name] MRN: [id number] DOB/AGE: May 28, 1960 60 y.o.  Admit date: 01/25/2021 Discharge date: 01/27/2021  Admission Diagnoses:  Osteoarthritis of right hip  Discharge Diagnoses:  Principal Problem:   Osteoarthritis of right hip Active Problems:   Avascular necrosis of femoral head, right ([place])   Past Medical History:  Diagnosis Date   Allergy    Arthritis    Back pain    Colon cancer ([place])    Depression    Diarrhea    GERD (gastroesophageal reflux disease)    Hypertension    Insomnia    MS (multiple sclerosis) ([place]) 2000   pt says that she doesn't have it   Other hammer toe (acquired) 03/31/2013   Pneumonia    hx of   Pre-diabetes    Umbilical hernia     Surgeries: Procedure(s): TOTAL HIP ARTHROPLASTY ANTERIOR APPROACH on 01/25/2021   Consultants (if any):   Discharged Condition: Improved  Hospital Course: DAMIKA HARMON is an 60 y.o. female who was admitted 01/25/2021 with a diagnosis of Osteoarthritis of right hip and went to the operating room on 01/25/2021 and underwent the above named procedures.    She was given perioperative antibiotics:  Anti-infectives (From admission, onward)    Start     Dose/Rate Route Frequency Ordered Stop   01/25/21 1500  ceFAZolin (ANCEF) IVPB 2g/100 mL premix        2 g 200 mL/hr over 30 Minutes Intravenous Every 6 hours 01/25/21 1252 01/25/21 2150   01/25/21 0630  vancomycin (VANCOREADY) IVPB 1500 mg/300 mL        1,500 mg 150 mL/hr over 120 Minutes Intravenous On call to O.R. 01/25/21 2536 01/25/21 0949   01/25/21 0630  ceFAZolin (ANCEF) IVPB 2g/100 mL premix        2 g 200 mL/hr over 30 Minutes Intravenous On call to O.R. 01/25/21 6440 01/25/21 3474     .  She was given sequential compression devices, early ambulation, and aspirin for DVT prophylaxis. An incisional vacuum and JP drain were in place to help with wound healing  She benefited maximally from the hospital  stay and there were no complications.    Recent vital signs:  Vitals:   01/26/21 2019 01/27/21 0406  BP: 122/67 131/71  Pulse: (!) 104 97  Resp: 20 20  Temp: 98.3 F (36.8 C) 98.2 F (36.8 C)  SpO2: 99% 100%    Recent laboratory studies:  Lab Results  Component Value Date   HGB 9.3 (L) 01/27/2021   HGB 9.3 (L) 01/26/2021   HGB 12.2 01/19/2021   Lab Results  Component Value Date   WBC 9.5 01/27/2021   PLT 161 01/27/2021   Lab Results  Component Value Date   INR 1.0 01/19/2021   Lab Results  Component Value Date   NA 137 01/26/2021   K 5.3 (H) 01/26/2021   CL 110 01/26/2021   CO2 21 (L) 01/26/2021   BUN 37 (H) 01/26/2021   CREATININE 1.04 (H) 01/26/2021   GLUCOSE 158 (H) 01/26/2021     WEIGHT BEARING   Weight bearing as tolerated with assist device (walker, cane, etc) as directed, use it as long as suggested by your surgeon or therapist, typically at least 4-6 weeks.   EXERCISES  Results after joint replacement surgery are often greatly improved when you follow the exercise, range of motion and muscle strengthening exercises prescribed by your doctor. Safety measures are also important to protect the joint  from further injury. Any time any of these exercises cause you to have increased pain or swelling, decrease what you are doing until you are comfortable again and then slowly increase them. If you have problems or questions, call your caregiver or physical therapist for advice.   Rehabilitation is important following a joint replacement. After just a few days of immobilization, the muscles of the leg can become weakened and shrink (atrophy).  These exercises are designed to build up the tone and strength of the thigh and leg muscles and to improve motion. Often times heat used for twenty to thirty minutes before working out will loosen up your tissues and help with improving the range of motion but do not use heat for the first two weeks following surgery (sometimes  heat can increase post-operative swelling).   These exercises can be done on a training (exercise) mat, on the floor, on a table or on a bed. Use whatever works the best and is most comfortable for you.    Use music or television while you are exercising so that the exercises are a pleasant break in your day. This will make your life better with the exercises acting as a break in your routine that you can look forward to.   Perform all exercises about fifteen times, three times per day or as directed.  You should exercise both the operative leg and the other leg as well.  Exercises include:   Quad Sets - Tighten up the muscle on the front of the thigh (Quad) and hold for 5-10 seconds.   Straight Leg Raises - With your knee straight (if you were given a brace, keep it on), lift the leg to 60 degrees, hold for 3 seconds, and slowly lower the leg.  Perform this exercise against resistance later as your leg gets stronger.  Leg Slides: Lying on your back, slowly slide your foot toward your buttocks, bending your knee up off the floor (only go as far as is comfortable). Then slowly slide your foot back down until your leg is flat on the floor again.  Angel Wings: Lying on your back spread your legs to the side as far apart as you can without causing discomfort.  Hamstring Strength:  Lying on your back, push your heel against the floor with your leg straight by tightening up the muscles of your buttocks.  Repeat, but this time bend your knee to a comfortable angle, and push your heel against the floor.  You may put a pillow under the heel to make it more comfortable if necessary.   A rehabilitation program following joint replacement surgery can speed recovery and prevent re-injury in the future due to weakened muscles. Contact your doctor or a physical therapist for more information on knee rehabilitation.   POST-OPERATIVE OPIOID TAPER INSTRUCTIONS: It is important to wean off of your opioid medication as  soon as possible. If you do not need pain medication after your surgery it is ok to stop day one. Opioids include: Codeine, Hydrocodone(Norco, Vicodin), Oxycodone(Percocet, oxycontin) and hydromorphone amongst others.  Long term and even short term use of opiods can cause: Increased pain response Dependence Constipation Depression Respiratory depression And more.  Withdrawal symptoms can include Flu like symptoms Nausea, vomiting And more Techniques to manage these symptoms Hydrate well Eat regular healthy meals Stay active Use relaxation techniques(deep breathing, meditating, yoga) Do Not substitute Alcohol to help with tapering If you have been on opioids for less than two weeks and do  not have pain than it is ok to stop all together.  Plan to wean off of opioids This plan should start within one week post op of your joint replacement. Maintain the same interval or time between taking each dose and first decrease the dose.  Cut the total daily intake of opioids by one tablet each day Next start to increase the time between doses. The last dose that should be eliminated is the evening dose.  Dental Antibiotics:  In most cases prophylactic antibiotics for Dental procdeures after total joint surgery are not necessary.  Exceptions are as follows:  1. History of prior total joint infection  2. Severely immunocompromised (Organ Transplant, cancer chemotherapy, Rheumatoid biologic meds such as [place])  3. Poorly controlled diabetes (A1C &gt; 8.0, blood glucose over 200)  If you have one of these conditions, contact your surgeon for an antibiotic prescription, prior to your dental procedure.    CONSTIPATION  Constipation is defined medically as fewer than three stools per week and severe constipation as less than one stool per week.  Even if you have a regular bowel pattern at home, your normal regimen is likely to be disrupted due to multiple reasons following surgery.   Combination of anesthesia, postoperative narcotics, change in appetite and fluid intake all can affect your bowels.   YOU MUST use at least one of the following options; they are listed in order of increasing strength to get the job done.  They are all available over the counter, and you may need to use some, POSSIBLY even all of these options:    Drink plenty of fluids (prune juice may be helpful) and high fiber foods Colace 100 mg by mouth twice a day  Senokot for constipation as directed and as needed Dulcolax (bisacodyl), take with full glass of water  Miralax (polyethylene glycol) once or twice a day as needed.  If you have tried all these things and are unable to have a bowel movement in the first 3-4 days after surgery call either your surgeon or your primary doctor.    If you experience loose stools or diarrhea, hold the medications until you stool forms back up.  If your symptoms do not get better within 1 week or if they get worse, check with your doctor.  If you experience "the worst abdominal pain ever" or develop nausea or vomiting, please contact the office immediately for further recommendations for treatment.   ITCHING:  If you experience itching with your medications, try taking only a single pain pill, or even half a pain pill at a time.  You can also use Benadryl over the counter for itching or also to help with sleep.   TED HOSE STOCKINGS:  Use stockings on both legs until for at least 2 weeks or as directed by physician office. They may be removed at night for sleeping.  MEDICATIONS:  See your medication summary on the "After Visit Summary" that nursing will review with you.  You may have some home medications which will be placed on hold until you complete the course of blood thinner medication.  It is important for you to complete the blood thinner medication as prescribed.  PRECAUTIONS:  If you experience chest pain or shortness of breath - call 911 immediately for  transfer to the hospital emergency department.   If you develop a fever greater that 101 F, purulent drainage from wound, increased redness or drainage from wound, foul odor from the wound/dressing, or calf pain -  CONTACT YOUR SURGEON.                                                   FOLLOW-UP APPOINTMENTS:  If you do not already have a post-op appointment, please call the office for an appointment to be seen by your surgeon.  Guidelines for how soon to be seen are listed in your "After Visit Summary", but are typically between 1-4 weeks after surgery.  OTHER INSTRUCTIONS:   Knee Replacement:  Do not place pillow under knee, focus on keeping the knee straight while resting. CPM instructions: 0-90 degrees, 2 hours in the morning, 2 hours in the afternoon, and 2 hours in the evening. Place foam block, curve side up under heel at all times except when in CPM or when walking.  DO NOT modify, tear, cut, or change the foam block in any way.   MAKE SURE YOU:  Understand these instructions.  Get help right away if you are not doing well or get worse.    Thank you for letting us be a part of your medical care team.  It is a privilege we respect greatly.  We hope these instructions will help you stay on track for a fast and full recovery!   Diagnostic Studies: DG Pelvis Portable  Result Date: 01/25/2021 CLINICAL DATA:  Postop right hip surgery EXAM: PORTABLE PELVIS 1-2 VIEWS COMPARISON:  None. FINDINGS: Interval right total hip arthroplasty without failure or complication. Normal alignment. Left total hip arthroplasty. Postsurgical changes in the soft tissues around the right hip. IMPRESSION: Interval right total hip arthroplasty. Electronically Signed   By: Kathreen Devoid M.D.   On: 01/25/2021 11:28   DG C-Arm 1-60 Min-No Report  Result Date: 01/25/2021 CLINICAL DATA:  postoperative RIGHT hip replacement, anterior upper placement. EXAM: OPERATIVE RIGHT HIP (WITH PELVIS IF PERFORMED) 4 VIEWS TECHNIQUE:  Fluoroscopic spot image(s) were submitted for interpretation post-operatively. COMPARISON:  April 28, 2020. FINDINGS: Four intraoperative spot fluoroscopic views are provided. Low AP pelvis and AP RIGHT hip pre hip replacement with extensive degenerative changes. Post placement of RIGHT hip arthroplasty in AP projection of the RIGHT hip and low AP pelvis without signs of acute or unexpected findings. FLUOROSCOPY TIME:  13 seconds IMPRESSION: Status post RIGHT hip arthroplasty. No immediate complications on limited intraoperative fluoroscopic assessment. Electronically Signed   By: Zetta Bills M.D.   On: 01/25/2021 10:38   DG HIP OPERATIVE UNILAT W OR W/O PELVIS RIGHT  Result Date: 01/25/2021 CLINICAL DATA:  postoperative RIGHT hip replacement, anterior upper placement. EXAM: OPERATIVE RIGHT HIP (WITH PELVIS IF PERFORMED) 4 VIEWS TECHNIQUE: Fluoroscopic spot image(s) were submitted for interpretation post-operatively. COMPARISON:  April 28, 2020. FINDINGS: Four intraoperative spot fluoroscopic views are provided. Low AP pelvis and AP RIGHT hip pre hip replacement with extensive degenerative changes. Post placement of RIGHT hip arthroplasty in AP projection of the RIGHT hip and low AP pelvis without signs of acute or unexpected findings. FLUOROSCOPY TIME:  13 seconds IMPRESSION: Status post RIGHT hip arthroplasty. No immediate complications on limited intraoperative fluoroscopic assessment. Electronically Signed   By: Zetta Bills M.D.   On: 01/25/2021 10:38    Disposition: Discharge disposition: 01-Home or Self Care       Discharge Instructions     Call MD / Call 911   Complete by: As directed  If you experience chest pain or shortness of breath, CALL 911 and be transported to the hospital emergency room.  If you develope a fever above 101 F, pus (white drainage) or increased drainage or redness at the wound, or calf pain, call your surgeon's office.   Constipation Prevention   Complete  by: As directed    Drink plenty of fluids.  Prune juice may be helpful.  You may use a stool softener, such as Colace (over the counter) 100 mg twice a day.  Use MiraLax (over the counter) for constipation as needed.   Diet - low sodium heart healthy   Complete by: As directed    Discharge instructions   Complete by: As directed    Charge portable suction unit nightly. Empty the drain 3 times daily and keep track of output.   Driving restrictions   Complete by: As directed    No driving for 6 weeks   Increase activity slowly as tolerated   Complete by: As directed    Lifting restrictions   Complete by: As directed    No lifting for 6 weeks   Post-operative opioid taper instructions:   Complete by: As directed    POST-OPERATIVE OPIOID TAPER INSTRUCTIONS: It is important to wean off of your opioid medication as soon as possible. If you do not need pain medication after your surgery it is ok to stop day one. Opioids include: Codeine, Hydrocodone(Norco, Vicodin), Oxycodone(Percocet, oxycontin) and hydromorphone amongst others.  Long term and even short term use of opiods can cause: Increased pain response Dependence Constipation Depression Respiratory depression And more.  Withdrawal symptoms can include Flu like symptoms Nausea, vomiting And more Techniques to manage these symptoms Hydrate well Eat regular healthy meals Stay active Use relaxation techniques(deep breathing, meditating, yoga) Do Not substitute Alcohol to help with tapering If you have been on opioids for less than two weeks and do not have pain than it is ok to stop all together.  Plan to wean off of opioids This plan should start within one week post op of your joint replacement. Maintain the same interval or time between taking each dose and first decrease the dose.  Cut the total daily intake of opioids by one tablet each day Next start to increase the time between doses. The last dose that should be  eliminated is the evening dose.      TED hose   Complete by: As directed    Use stockings (TED hose) for 2 weeks on both leg(s).  You may remove them at night for sleeping.        Follow-up Information     Swinteck, Aaron Edelman, MD. Schedule an appointment as soon as possible for a visit in 1 week(s).   Specialty: Orthopedic Surgery Why: Please make an appointment for 11/15 for incisional vacuum removal and drain removal You are scheduled for a follow up appointment on 02-13-21 at 9:30 am. Contact information: [address] [place] [address] [phone]         Care, [facility] Follow up.   Specialty: [place] Why: to provide home health physical therapy Contact information: [address] [place] [address] [phone]                  Signed: Dorothyann Peng 01/27/2021, 8:07 AM

## 2021-01-27 NOTE — Progress Notes (Signed)
Discharge package printed and instructions given to pt. Patient verbalizes understanding. 

## 2021-01-27 NOTE — Progress Notes (Signed)
Physical Therapy Treatment Patient Details Name: Misty [name] MRN: [id number] DOB: 25-Aug-1960 Today's Date: 01/27/2021   History of Present Illness 60 yo s/p R DA THA. PMH: L THA, colon CA, ACDF    PT Comments    Pt continues very cooperative and eager for dc home.  Pt reviewed bed mobility and car transfers and performed HEP with written instruction provided and reviewed.   Recommendations for follow up therapy are one component of a multi-disciplinary discharge planning process, led by the attending physician.  Recommendations may be updated based on patient status, additional functional criteria and insurance authorization.  Follow Up Recommendations  Follow physician's recommendations for discharge plan and follow up therapies     Assistance Recommended at Discharge Intermittent Supervision/Assistance  Equipment Recommendations  None recommended by PT    Recommendations for Other Services       Precautions / Restrictions Precautions Precaution Comments: JP drain on R, incisional VAC R hip Restrictions RLE Weight Bearing: Weight bearing as tolerated Other Position/Activity Restrictions: WBAT     Mobility  Bed Mobility Overal bed mobility: Needs Assistance Bed Mobility: Supine to Sit;Sit to Supine     Supine to sit: Supervision Sit to supine: Supervision   General bed mobility comments: cues for sequence and use of gait belt but no physical assist required    Transfers Overall transfer level: Needs assistance Equipment used: Rolling walker (2 wheels) Transfers: Sit to/from Stand Sit to Stand: Supervision           General transfer comment: Pt self-cues for hand placement and LE management    Ambulation/Gait Ambulation/Gait assistance: Min guard;Supervision Gait Distance (Feet): 160 Feet (160' twice with seated rest break between) Assistive device: Rolling walker (2 wheels) Gait Pattern/deviations: Decreased step length - right;Decreased step length  - left;Shuffle;Trunk flexed;Step-to pattern;Step-through pattern       General Gait Details: INcreased time with min cues for posture, position from RW and initial sequence   Stairs Stairs: Yes Stairs assistance: Min guard Stair Management: No rails;One rail Right;Step to pattern;Backwards;Forwards;With cane;With walker Number of Stairs: 6 General stair comments: single step fwd x1 and bkwd x 2 with RW; 3 step with single rail and cane.  Cues for sequence and foot/RW/cane placement; written instruction provided   Wheelchair Mobility    Modified Rankin (Stroke Patients Only)       Balance Overall balance assessment: Needs assistance Sitting-balance support: No upper extremity supported;Feet supported Sitting balance-Leahy Scale: Good     Standing balance support: Bilateral upper extremity supported Standing balance-Leahy Scale: Fair Standing balance comment: pt dons bathrobe with bil hands in standing                            Cognition Arousal/Alertness: Awake/alert Behavior During Therapy: WFL for tasks assessed/performed Overall Cognitive Status: Within Functional Limits for tasks assessed                                          Exercises Total Joint Exercises Ankle Circles/Pumps: AROM;Both;15 reps;Supine Quad Sets: AROM;Both;10 reps;Supine Heel Slides: AAROM;Both;20 reps;Supine Hip ABduction/ADduction: AAROM;Left;15 reps;Supine Long Arc Quad: AAROM;Right;10 reps;Seated    General Comments        Pertinent Vitals/Pain Pain Assessment: 0-10 Pain Score: 4  Pain Location: back and R hip Pain Descriptors / Indicators: Aching;Sore Pain Intervention(s): Limited activity within patient's tolerance;Monitored during  session;Premedicated before session    Home Living                          Prior Function            PT Goals (current goals can now be found in the care plan section) Acute Rehab PT Goals Patient Stated  Goal: home, wants HHPT PT Goal Formulation: With patient Time For Goal Achievement: 02/01/21 Potential to Achieve Goals: Good Progress towards PT goals: Progressing toward goals    Frequency    7X/week      PT Plan Current plan remains appropriate    Co-evaluation              AM-PAC PT "6 Clicks" Mobility   Outcome Measure  Help needed turning from your back to your side while in a flat bed without using bedrails?: A Little Help needed moving from lying on your back to sitting on the side of a flat bed without using bedrails?: A Little Help needed moving to and from a bed to a chair (including a wheelchair)?: A Little Help needed standing up from a chair using your arms (e.g., wheelchair or bedside chair)?: A Little Help needed to walk in hospital room?: A Little Help needed climbing 3-5 steps with a railing? : A Little 6 Click Score: 18    End of Session Equipment Utilized During Treatment: Gait belt Activity Tolerance: Patient tolerated treatment well Patient left: with call bell/phone within reach;in chair;with family/visitor present Nurse Communication: Mobility status PT Visit Diagnosis: Other abnormalities of gait and mobility (R26.89);Difficulty in walking, not elsewhere classified (R26.2)     Time: 1032-1100 PT Time Calculation (min) (ACUTE ONLY): 28 min  Charges:  $Gait Training: 23-37 mins $Therapeutic Exercise: 8-22 mins $Therapeutic Activity: 8-22 mins                     Debe Coder PT Acute Rehabilitation Services Pager [phone] Office [phone]    Mylea Roarty 01/27/2021, 12:31 PM

## 2021-01-27 NOTE — Progress Notes (Signed)
Physical Therapy Treatment Patient Details Name: Misty [name] MRN: [id number] DOB: 1960/08/26 Today's Date: 01/27/2021   History of Present Illness 60 yo s/p R DA THA. PMH: L THA, colon CA, ACDF    PT Comments    Pt motivated and progressing well with mobility including ambulating increased distance in hall and negotiating stairs in multiple ways to address home needs.  Therex deferred on arrival of bfast and with pt requesting pain meds.   Recommendations for follow up therapy are one component of a multi-disciplinary discharge planning process, led by the attending physician.  Recommendations may be updated based on patient status, additional functional criteria and insurance authorization.  Follow Up Recommendations  Follow physician's recommendations for discharge plan and follow up therapies     Assistance Recommended at Discharge Intermittent Supervision/Assistance  Equipment Recommendations  None recommended by PT    Recommendations for Other Services       Precautions / Restrictions Precautions Precaution Comments: JP drain on R, incisional VAC R hip Restrictions RLE Weight Bearing: Weight bearing as tolerated Other Position/Activity Restrictions: WBAT     Mobility  Bed Mobility               General bed mobility comments: Pt up in chair and returns to same for bfast    Transfers Overall transfer level: Needs assistance Equipment used: Rolling walker (2 wheels) Transfers: Sit to/from Stand Sit to Stand: Min guard;Supervision           General transfer comment: Pt self-cues for hand placement and LE management    Ambulation/Gait Ambulation/Gait assistance: Min guard;Supervision Gait Distance (Feet): 160 Feet (160' twice with seated rest break between) Assistive device: Rolling walker (2 wheels) Gait Pattern/deviations: Decreased step length - right;Decreased step length - left;Shuffle;Trunk flexed;Step-to pattern;Step-through pattern        General Gait Details: INcreased time with min cues for posture, position from RW and initial sequence   Stairs Stairs: Yes Stairs assistance: Min guard Stair Management: No rails;One rail Right;Step to pattern;Backwards;Forwards;With cane;With walker Number of Stairs: 6 General stair comments: single step fwd x1 and bkwd x 2 with RW; 3 step with single rail and cane.  Cues for sequence and foot/RW/cane placement; written instruction provided   Wheelchair Mobility    Modified Rankin (Stroke Patients Only)       Balance Overall balance assessment: Needs assistance Sitting-balance support: No upper extremity supported;Feet supported Sitting balance-Leahy Scale: Good     Standing balance support: Bilateral upper extremity supported Standing balance-Leahy Scale: Fair Standing balance comment: pt dons bathrobe with bil hands in standing                            Cognition Arousal/Alertness: Awake/alert Behavior During Therapy: WFL for tasks assessed/performed Overall Cognitive Status: Within Functional Limits for tasks assessed                                          Exercises      General Comments        Pertinent Vitals/Pain Pain Assessment: 0-10 Pain Score: 5  Pain Location: back and R hip Pain Descriptors / Indicators: Aching;Sore Pain Intervention(s): Limited activity within patient's tolerance;Monitored during session;Patient requesting pain meds-RN notified    Home Living  Prior Function            PT Goals (current goals can now be found in the care plan section) Acute Rehab PT Goals Patient Stated Goal: home, wants HHPT PT Goal Formulation: With patient Time For Goal Achievement: 02/01/21 Potential to Achieve Goals: Good Progress towards PT goals: Progressing toward goals    Frequency    7X/week      PT Plan Current plan remains appropriate    Co-evaluation               AM-PAC PT "6 Clicks" Mobility   Outcome Measure  Help needed turning from your back to your side while in a flat bed without using bedrails?: A Little Help needed moving from lying on your back to sitting on the side of a flat bed without using bedrails?: A Little Help needed moving to and from a bed to a chair (including a wheelchair)?: A Little Help needed standing up from a chair using your arms (e.g., wheelchair or bedside chair)?: A Little Help needed to walk in hospital room?: A Little Help needed climbing 3-5 steps with a railing? : A Little 6 Click Score: 18    End of Session Equipment Utilized During Treatment: Gait belt Activity Tolerance: Patient tolerated treatment well Patient left: with call bell/phone within reach;in chair;with family/visitor present Nurse Communication: Mobility status PT Visit Diagnosis: Other abnormalities of gait and mobility (R26.89);Difficulty in walking, not elsewhere classified (R26.2)     Time: 5188-4166 PT Time Calculation (min) (ACUTE ONLY): 36 min  Charges:  $Gait Training: 23-37 mins                     [place] Pager [phone] Office [phone]    Kouper Spinella 01/27/2021, 9:29 AM

## 2021-01-27 NOTE — Progress Notes (Signed)
    Subjective:  Patient reports pain as mild to moderate.  Denies N/V/CP/SOB. Patient eager to discharge home Objective:   VITALS:   Vitals:   01/26/21 1000 01/26/21 1400 01/26/21 2019 01/27/21 0406  BP: (!) 138/125 132/61 122/67 131/71  Pulse: 85 86 (!) 104 97  Resp: 17 16 20 20   Temp: 98.2 F (36.8 C) 98.5 F (36.9 C) 98.3 F (36.8 C) 98.2 F (36.8 C)  TempSrc: Oral Oral Oral Oral  SpO2: 100%  99% 100%  Weight:      Height:       JP: 135 cc yesterday   NAD ABD soft Sensation intact distally Intact pulses distally Dorsiflexion/Plantar flexion intact iVAC: intact without leak JP: scant ss  Lab Results  Component Value Date   WBC 9.5 01/27/2021   HGB 9.3 (L) 01/27/2021   HCT 30.8 (L) 01/27/2021   MCV 98.4 01/27/2021   PLT 161 01/27/2021   BMET    Component Value Date/Time   NA 137 01/26/2021 1429   K 5.3 (H) 01/26/2021 1429   CL 110 01/26/2021 1429   CO2 21 (L) 01/26/2021 1429   GLUCOSE 158 (H) 01/26/2021 1429   BUN 37 (H) 01/26/2021 1429   CREATININE 1.04 (H) 01/26/2021 1429   CREATININE 0.51 08/21/2013 1337   CALCIUM 8.9 01/26/2021 1429   GFRNONAA >60 01/26/2021 1429   GFRNONAA >89 08/03/2013 1127     Assessment/Plan: 2 Days Post-Op   Principal Problem:   Osteoarthritis of right hip Active Problems:   Avascular necrosis of femoral head, right (HCC)   WBAT with walker DVT ppx: Aspirin, SCDs, TEDS PO pain control PT/OT Continue incisional VAC, will switch to portable Prevena unit upon d/c Continue JP drain at discharge Dispo: plan for d/c home today  with HHPT, will d/c with portable negative pressure dressing (at bedside) and JP drain   Dorothyann Peng 01/27/2021, 8:03 AM   [facility] is now [facility] [address], [place], [place] [address] Phone: [phone] [email or web address] Facebook  [employer]

## 2021-01-27 NOTE — Plan of Care (Signed)
  Problem: Activity: Goal: Risk for activity intolerance will decrease Outcome: Progressing   Problem: Pain Managment: Goal: General experience of comfort will improve Outcome: Progressing   

## 2021-01-28 ENCOUNTER — Encounter (HOSPITAL_COMMUNITY): Payer: Self-pay | Admitting: Orthopedic Surgery

## 2021-01-30 DIAGNOSIS — K429 Umbilical hernia without obstruction or gangrene: Secondary | ICD-10-CM | POA: Diagnosis not present

## 2021-01-30 DIAGNOSIS — G35 Multiple sclerosis: Secondary | ICD-10-CM | POA: Diagnosis not present

## 2021-01-30 DIAGNOSIS — M4720 Other spondylosis with radiculopathy, site unspecified: Secondary | ICD-10-CM | POA: Diagnosis not present

## 2021-01-30 DIAGNOSIS — Z96641 Presence of right artificial hip joint: Secondary | ICD-10-CM | POA: Diagnosis not present

## 2021-01-30 DIAGNOSIS — G4700 Insomnia, unspecified: Secondary | ICD-10-CM | POA: Diagnosis not present

## 2021-01-30 DIAGNOSIS — G2581 Restless legs syndrome: Secondary | ICD-10-CM | POA: Diagnosis not present

## 2021-01-30 DIAGNOSIS — J449 Chronic obstructive pulmonary disease, unspecified: Secondary | ICD-10-CM | POA: Diagnosis not present

## 2021-01-30 DIAGNOSIS — R7303 Prediabetes: Secondary | ICD-10-CM | POA: Diagnosis not present

## 2021-01-30 DIAGNOSIS — G8929 Other chronic pain: Secondary | ICD-10-CM | POA: Diagnosis not present

## 2021-01-30 DIAGNOSIS — M519 Unspecified thoracic, thoracolumbar and lumbosacral intervertebral disc disorder: Secondary | ICD-10-CM | POA: Diagnosis not present

## 2021-01-30 DIAGNOSIS — M48061 Spinal stenosis, lumbar region without neurogenic claudication: Secondary | ICD-10-CM | POA: Diagnosis not present

## 2021-01-30 DIAGNOSIS — M7501 Adhesive capsulitis of right shoulder: Secondary | ICD-10-CM | POA: Diagnosis not present

## 2021-01-30 DIAGNOSIS — K8020 Calculus of gallbladder without cholecystitis without obstruction: Secondary | ICD-10-CM | POA: Diagnosis not present

## 2021-01-30 DIAGNOSIS — E559 Vitamin D deficiency, unspecified: Secondary | ICD-10-CM | POA: Diagnosis not present

## 2021-01-30 DIAGNOSIS — Z471 Aftercare following joint replacement surgery: Secondary | ICD-10-CM | POA: Diagnosis not present

## 2021-01-30 DIAGNOSIS — E785 Hyperlipidemia, unspecified: Secondary | ICD-10-CM | POA: Diagnosis not present

## 2021-01-30 DIAGNOSIS — M2042 Other hammer toe(s) (acquired), left foot: Secondary | ICD-10-CM | POA: Diagnosis not present

## 2021-01-30 DIAGNOSIS — M62830 Muscle spasm of back: Secondary | ICD-10-CM | POA: Diagnosis not present

## 2021-01-30 DIAGNOSIS — K580 Irritable bowel syndrome with diarrhea: Secondary | ICD-10-CM | POA: Diagnosis not present

## 2021-01-30 DIAGNOSIS — I10 Essential (primary) hypertension: Secondary | ICD-10-CM | POA: Diagnosis not present

## 2021-01-30 DIAGNOSIS — F32A Depression, unspecified: Secondary | ICD-10-CM | POA: Diagnosis not present

## 2021-01-30 DIAGNOSIS — K439 Ventral hernia without obstruction or gangrene: Secondary | ICD-10-CM | POA: Diagnosis not present

## 2021-02-01 DIAGNOSIS — F32A Depression, unspecified: Secondary | ICD-10-CM | POA: Diagnosis not present

## 2021-02-01 DIAGNOSIS — M4720 Other spondylosis with radiculopathy, site unspecified: Secondary | ICD-10-CM | POA: Diagnosis not present

## 2021-02-01 DIAGNOSIS — J449 Chronic obstructive pulmonary disease, unspecified: Secondary | ICD-10-CM | POA: Diagnosis not present

## 2021-02-01 DIAGNOSIS — Z96641 Presence of right artificial hip joint: Secondary | ICD-10-CM | POA: Diagnosis not present

## 2021-02-01 DIAGNOSIS — K580 Irritable bowel syndrome with diarrhea: Secondary | ICD-10-CM | POA: Diagnosis not present

## 2021-02-01 DIAGNOSIS — I10 Essential (primary) hypertension: Secondary | ICD-10-CM | POA: Diagnosis not present

## 2021-02-01 DIAGNOSIS — G8929 Other chronic pain: Secondary | ICD-10-CM | POA: Diagnosis not present

## 2021-02-01 DIAGNOSIS — M62830 Muscle spasm of back: Secondary | ICD-10-CM | POA: Diagnosis not present

## 2021-02-01 DIAGNOSIS — Z471 Aftercare following joint replacement surgery: Secondary | ICD-10-CM | POA: Diagnosis not present

## 2021-02-01 DIAGNOSIS — M519 Unspecified thoracic, thoracolumbar and lumbosacral intervertebral disc disorder: Secondary | ICD-10-CM | POA: Diagnosis not present

## 2021-02-01 DIAGNOSIS — G35 Multiple sclerosis: Secondary | ICD-10-CM | POA: Diagnosis not present

## 2021-02-01 DIAGNOSIS — K8020 Calculus of gallbladder without cholecystitis without obstruction: Secondary | ICD-10-CM | POA: Diagnosis not present

## 2021-02-01 DIAGNOSIS — G4700 Insomnia, unspecified: Secondary | ICD-10-CM | POA: Diagnosis not present

## 2021-02-01 DIAGNOSIS — M48061 Spinal stenosis, lumbar region without neurogenic claudication: Secondary | ICD-10-CM | POA: Diagnosis not present

## 2021-02-01 DIAGNOSIS — M7501 Adhesive capsulitis of right shoulder: Secondary | ICD-10-CM | POA: Diagnosis not present

## 2021-02-01 DIAGNOSIS — K439 Ventral hernia without obstruction or gangrene: Secondary | ICD-10-CM | POA: Diagnosis not present

## 2021-02-01 DIAGNOSIS — E559 Vitamin D deficiency, unspecified: Secondary | ICD-10-CM | POA: Diagnosis not present

## 2021-02-01 DIAGNOSIS — K429 Umbilical hernia without obstruction or gangrene: Secondary | ICD-10-CM | POA: Diagnosis not present

## 2021-02-01 DIAGNOSIS — G2581 Restless legs syndrome: Secondary | ICD-10-CM | POA: Diagnosis not present

## 2021-02-01 DIAGNOSIS — M2042 Other hammer toe(s) (acquired), left foot: Secondary | ICD-10-CM | POA: Diagnosis not present

## 2021-02-01 DIAGNOSIS — E785 Hyperlipidemia, unspecified: Secondary | ICD-10-CM | POA: Diagnosis not present

## 2021-02-01 DIAGNOSIS — R7303 Prediabetes: Secondary | ICD-10-CM | POA: Diagnosis not present

## 2021-02-02 ENCOUNTER — Other Ambulatory Visit: Payer: Self-pay | Admitting: Internal Medicine

## 2021-02-06 DIAGNOSIS — G8929 Other chronic pain: Secondary | ICD-10-CM | POA: Diagnosis not present

## 2021-02-06 DIAGNOSIS — J449 Chronic obstructive pulmonary disease, unspecified: Secondary | ICD-10-CM | POA: Diagnosis not present

## 2021-02-06 DIAGNOSIS — K8020 Calculus of gallbladder without cholecystitis without obstruction: Secondary | ICD-10-CM | POA: Diagnosis not present

## 2021-02-06 DIAGNOSIS — I10 Essential (primary) hypertension: Secondary | ICD-10-CM | POA: Diagnosis not present

## 2021-02-06 DIAGNOSIS — G4700 Insomnia, unspecified: Secondary | ICD-10-CM | POA: Diagnosis not present

## 2021-02-06 DIAGNOSIS — M48061 Spinal stenosis, lumbar region without neurogenic claudication: Secondary | ICD-10-CM | POA: Diagnosis not present

## 2021-02-06 DIAGNOSIS — M4720 Other spondylosis with radiculopathy, site unspecified: Secondary | ICD-10-CM | POA: Diagnosis not present

## 2021-02-06 DIAGNOSIS — G2581 Restless legs syndrome: Secondary | ICD-10-CM | POA: Diagnosis not present

## 2021-02-06 DIAGNOSIS — K580 Irritable bowel syndrome with diarrhea: Secondary | ICD-10-CM | POA: Diagnosis not present

## 2021-02-06 DIAGNOSIS — Z96641 Presence of right artificial hip joint: Secondary | ICD-10-CM | POA: Diagnosis not present

## 2021-02-06 DIAGNOSIS — K439 Ventral hernia without obstruction or gangrene: Secondary | ICD-10-CM | POA: Diagnosis not present

## 2021-02-06 DIAGNOSIS — F32A Depression, unspecified: Secondary | ICD-10-CM | POA: Diagnosis not present

## 2021-02-06 DIAGNOSIS — K429 Umbilical hernia without obstruction or gangrene: Secondary | ICD-10-CM | POA: Diagnosis not present

## 2021-02-06 DIAGNOSIS — R7303 Prediabetes: Secondary | ICD-10-CM | POA: Diagnosis not present

## 2021-02-06 DIAGNOSIS — M519 Unspecified thoracic, thoracolumbar and lumbosacral intervertebral disc disorder: Secondary | ICD-10-CM | POA: Diagnosis not present

## 2021-02-06 DIAGNOSIS — M2042 Other hammer toe(s) (acquired), left foot: Secondary | ICD-10-CM | POA: Diagnosis not present

## 2021-02-06 DIAGNOSIS — Z471 Aftercare following joint replacement surgery: Secondary | ICD-10-CM | POA: Diagnosis not present

## 2021-02-06 DIAGNOSIS — E785 Hyperlipidemia, unspecified: Secondary | ICD-10-CM | POA: Diagnosis not present

## 2021-02-06 DIAGNOSIS — M62830 Muscle spasm of back: Secondary | ICD-10-CM | POA: Diagnosis not present

## 2021-02-06 DIAGNOSIS — E559 Vitamin D deficiency, unspecified: Secondary | ICD-10-CM | POA: Diagnosis not present

## 2021-02-06 DIAGNOSIS — G35 Multiple sclerosis: Secondary | ICD-10-CM | POA: Diagnosis not present

## 2021-02-06 DIAGNOSIS — M7501 Adhesive capsulitis of right shoulder: Secondary | ICD-10-CM | POA: Diagnosis not present

## 2021-02-07 DIAGNOSIS — Z471 Aftercare following joint replacement surgery: Secondary | ICD-10-CM | POA: Diagnosis not present

## 2021-02-07 DIAGNOSIS — Z96641 Presence of right artificial hip joint: Secondary | ICD-10-CM | POA: Diagnosis not present

## 2021-02-08 DIAGNOSIS — Z96641 Presence of right artificial hip joint: Secondary | ICD-10-CM | POA: Diagnosis not present

## 2021-02-08 DIAGNOSIS — G2581 Restless legs syndrome: Secondary | ICD-10-CM | POA: Diagnosis not present

## 2021-02-08 DIAGNOSIS — K429 Umbilical hernia without obstruction or gangrene: Secondary | ICD-10-CM | POA: Diagnosis not present

## 2021-02-08 DIAGNOSIS — M48061 Spinal stenosis, lumbar region without neurogenic claudication: Secondary | ICD-10-CM | POA: Diagnosis not present

## 2021-02-08 DIAGNOSIS — M7501 Adhesive capsulitis of right shoulder: Secondary | ICD-10-CM | POA: Diagnosis not present

## 2021-02-08 DIAGNOSIS — G4700 Insomnia, unspecified: Secondary | ICD-10-CM | POA: Diagnosis not present

## 2021-02-08 DIAGNOSIS — K8020 Calculus of gallbladder without cholecystitis without obstruction: Secondary | ICD-10-CM | POA: Diagnosis not present

## 2021-02-08 DIAGNOSIS — J449 Chronic obstructive pulmonary disease, unspecified: Secondary | ICD-10-CM | POA: Diagnosis not present

## 2021-02-08 DIAGNOSIS — M4720 Other spondylosis with radiculopathy, site unspecified: Secondary | ICD-10-CM | POA: Diagnosis not present

## 2021-02-08 DIAGNOSIS — M2042 Other hammer toe(s) (acquired), left foot: Secondary | ICD-10-CM | POA: Diagnosis not present

## 2021-02-08 DIAGNOSIS — R7303 Prediabetes: Secondary | ICD-10-CM | POA: Diagnosis not present

## 2021-02-08 DIAGNOSIS — K580 Irritable bowel syndrome with diarrhea: Secondary | ICD-10-CM | POA: Diagnosis not present

## 2021-02-08 DIAGNOSIS — G35 Multiple sclerosis: Secondary | ICD-10-CM | POA: Diagnosis not present

## 2021-02-08 DIAGNOSIS — F32A Depression, unspecified: Secondary | ICD-10-CM | POA: Diagnosis not present

## 2021-02-08 DIAGNOSIS — E559 Vitamin D deficiency, unspecified: Secondary | ICD-10-CM | POA: Diagnosis not present

## 2021-02-08 DIAGNOSIS — E785 Hyperlipidemia, unspecified: Secondary | ICD-10-CM | POA: Diagnosis not present

## 2021-02-08 DIAGNOSIS — M519 Unspecified thoracic, thoracolumbar and lumbosacral intervertebral disc disorder: Secondary | ICD-10-CM | POA: Diagnosis not present

## 2021-02-08 DIAGNOSIS — M62830 Muscle spasm of back: Secondary | ICD-10-CM | POA: Diagnosis not present

## 2021-02-08 DIAGNOSIS — I10 Essential (primary) hypertension: Secondary | ICD-10-CM | POA: Diagnosis not present

## 2021-02-08 DIAGNOSIS — Z471 Aftercare following joint replacement surgery: Secondary | ICD-10-CM | POA: Diagnosis not present

## 2021-02-08 DIAGNOSIS — G8929 Other chronic pain: Secondary | ICD-10-CM | POA: Diagnosis not present

## 2021-02-08 DIAGNOSIS — K439 Ventral hernia without obstruction or gangrene: Secondary | ICD-10-CM | POA: Diagnosis not present

## 2021-02-13 ENCOUNTER — Encounter: Payer: Self-pay | Admitting: Podiatry

## 2021-02-13 ENCOUNTER — Ambulatory Visit: Payer: Medicare Other | Admitting: Podiatry

## 2021-02-13 ENCOUNTER — Other Ambulatory Visit: Payer: Self-pay

## 2021-02-13 ENCOUNTER — Ambulatory Visit (INDEPENDENT_AMBULATORY_CARE_PROVIDER_SITE_OTHER): Payer: Medicare Other | Admitting: Podiatry

## 2021-02-13 DIAGNOSIS — M79671 Pain in right foot: Secondary | ICD-10-CM | POA: Diagnosis not present

## 2021-02-13 DIAGNOSIS — M519 Unspecified thoracic, thoracolumbar and lumbosacral intervertebral disc disorder: Secondary | ICD-10-CM | POA: Diagnosis not present

## 2021-02-13 DIAGNOSIS — E559 Vitamin D deficiency, unspecified: Secondary | ICD-10-CM | POA: Diagnosis not present

## 2021-02-13 DIAGNOSIS — G8929 Other chronic pain: Secondary | ICD-10-CM | POA: Diagnosis not present

## 2021-02-13 DIAGNOSIS — K429 Umbilical hernia without obstruction or gangrene: Secondary | ICD-10-CM | POA: Diagnosis not present

## 2021-02-13 DIAGNOSIS — B351 Tinea unguium: Secondary | ICD-10-CM

## 2021-02-13 DIAGNOSIS — K580 Irritable bowel syndrome with diarrhea: Secondary | ICD-10-CM | POA: Diagnosis not present

## 2021-02-13 DIAGNOSIS — Z471 Aftercare following joint replacement surgery: Secondary | ICD-10-CM | POA: Diagnosis not present

## 2021-02-13 DIAGNOSIS — G4700 Insomnia, unspecified: Secondary | ICD-10-CM | POA: Diagnosis not present

## 2021-02-13 DIAGNOSIS — K439 Ventral hernia without obstruction or gangrene: Secondary | ICD-10-CM | POA: Diagnosis not present

## 2021-02-13 DIAGNOSIS — Q828 Other specified congenital malformations of skin: Secondary | ICD-10-CM

## 2021-02-13 DIAGNOSIS — E785 Hyperlipidemia, unspecified: Secondary | ICD-10-CM | POA: Diagnosis not present

## 2021-02-13 DIAGNOSIS — F32A Depression, unspecified: Secondary | ICD-10-CM | POA: Diagnosis not present

## 2021-02-13 DIAGNOSIS — K8020 Calculus of gallbladder without cholecystitis without obstruction: Secondary | ICD-10-CM | POA: Diagnosis not present

## 2021-02-13 DIAGNOSIS — M2042 Other hammer toe(s) (acquired), left foot: Secondary | ICD-10-CM | POA: Diagnosis not present

## 2021-02-13 DIAGNOSIS — M48061 Spinal stenosis, lumbar region without neurogenic claudication: Secondary | ICD-10-CM | POA: Diagnosis not present

## 2021-02-13 DIAGNOSIS — M4720 Other spondylosis with radiculopathy, site unspecified: Secondary | ICD-10-CM | POA: Diagnosis not present

## 2021-02-13 DIAGNOSIS — J449 Chronic obstructive pulmonary disease, unspecified: Secondary | ICD-10-CM | POA: Diagnosis not present

## 2021-02-13 DIAGNOSIS — M7501 Adhesive capsulitis of right shoulder: Secondary | ICD-10-CM | POA: Diagnosis not present

## 2021-02-13 DIAGNOSIS — G35 Multiple sclerosis: Secondary | ICD-10-CM | POA: Diagnosis not present

## 2021-02-13 DIAGNOSIS — M62830 Muscle spasm of back: Secondary | ICD-10-CM | POA: Diagnosis not present

## 2021-02-13 DIAGNOSIS — Z96641 Presence of right artificial hip joint: Secondary | ICD-10-CM | POA: Diagnosis not present

## 2021-02-13 DIAGNOSIS — I10 Essential (primary) hypertension: Secondary | ICD-10-CM | POA: Diagnosis not present

## 2021-02-13 DIAGNOSIS — R7303 Prediabetes: Secondary | ICD-10-CM | POA: Diagnosis not present

## 2021-02-13 DIAGNOSIS — G2581 Restless legs syndrome: Secondary | ICD-10-CM | POA: Diagnosis not present

## 2021-02-13 NOTE — Progress Notes (Signed)
This patient presents to the office to have her callus trimmed on her trght foot..    She says the callus is painful walking.  .  She presents for preventative foot care services. She says she has lost over 20 pounds and is going to have hip surgery.  Therefore she is walking less and less.   Vascular  Dorsalis pedis and posterior tibial pulses are palpable  B/L.  Capillary return  WNL.  Temperature gradient is  WNL.  Skin turgor  WNL  Sensorium  Senn Weinstein monofilament wire  WNL. Normal tactile sensation.  Nail Exam  Patient has normal nails with no evidence of bacterial or fungal infection.  Orthopedic  Exam  Muscle tone and muscle strength  WNL.  No limitations of motion feet  B/L.  No crepitus or joint effusion noted.  Foot type is unremarkable and digits show no abnormalities.  HAV 1st MPJ right foot.  Skin  No open lesions.  Normal skin texture and turgor. Porokeratosis sub 3 right foot.  Porokeratosis  Right foot.  Debride porokeratosis using a # 15 blade.  RTC 10 weeks .     Gardiner Barefoot DPM

## 2021-02-15 ENCOUNTER — Ambulatory Visit: Payer: Medicare Other | Admitting: Podiatry

## 2021-02-16 DIAGNOSIS — M87059 Idiopathic aseptic necrosis of unspecified femur: Secondary | ICD-10-CM | POA: Diagnosis not present

## 2021-02-16 DIAGNOSIS — M160 Bilateral primary osteoarthritis of hip: Secondary | ICD-10-CM | POA: Diagnosis not present

## 2021-02-16 DIAGNOSIS — G894 Chronic pain syndrome: Secondary | ICD-10-CM | POA: Diagnosis not present

## 2021-02-16 DIAGNOSIS — M5136 Other intervertebral disc degeneration, lumbar region: Secondary | ICD-10-CM | POA: Diagnosis not present

## 2021-02-17 DIAGNOSIS — M62830 Muscle spasm of back: Secondary | ICD-10-CM | POA: Diagnosis not present

## 2021-02-17 DIAGNOSIS — K439 Ventral hernia without obstruction or gangrene: Secondary | ICD-10-CM | POA: Diagnosis not present

## 2021-02-17 DIAGNOSIS — E559 Vitamin D deficiency, unspecified: Secondary | ICD-10-CM | POA: Diagnosis not present

## 2021-02-17 DIAGNOSIS — F32A Depression, unspecified: Secondary | ICD-10-CM | POA: Diagnosis not present

## 2021-02-17 DIAGNOSIS — G35 Multiple sclerosis: Secondary | ICD-10-CM | POA: Diagnosis not present

## 2021-02-17 DIAGNOSIS — M4720 Other spondylosis with radiculopathy, site unspecified: Secondary | ICD-10-CM | POA: Diagnosis not present

## 2021-02-17 DIAGNOSIS — M519 Unspecified thoracic, thoracolumbar and lumbosacral intervertebral disc disorder: Secondary | ICD-10-CM | POA: Diagnosis not present

## 2021-02-17 DIAGNOSIS — M7501 Adhesive capsulitis of right shoulder: Secondary | ICD-10-CM | POA: Diagnosis not present

## 2021-02-17 DIAGNOSIS — M48061 Spinal stenosis, lumbar region without neurogenic claudication: Secondary | ICD-10-CM | POA: Diagnosis not present

## 2021-02-17 DIAGNOSIS — R7303 Prediabetes: Secondary | ICD-10-CM | POA: Diagnosis not present

## 2021-02-17 DIAGNOSIS — K8020 Calculus of gallbladder without cholecystitis without obstruction: Secondary | ICD-10-CM | POA: Diagnosis not present

## 2021-02-17 DIAGNOSIS — I10 Essential (primary) hypertension: Secondary | ICD-10-CM | POA: Diagnosis not present

## 2021-02-17 DIAGNOSIS — Z471 Aftercare following joint replacement surgery: Secondary | ICD-10-CM | POA: Diagnosis not present

## 2021-02-17 DIAGNOSIS — G4700 Insomnia, unspecified: Secondary | ICD-10-CM | POA: Diagnosis not present

## 2021-02-17 DIAGNOSIS — J449 Chronic obstructive pulmonary disease, unspecified: Secondary | ICD-10-CM | POA: Diagnosis not present

## 2021-02-17 DIAGNOSIS — Z96641 Presence of right artificial hip joint: Secondary | ICD-10-CM | POA: Diagnosis not present

## 2021-02-17 DIAGNOSIS — G8929 Other chronic pain: Secondary | ICD-10-CM | POA: Diagnosis not present

## 2021-02-17 DIAGNOSIS — G2581 Restless legs syndrome: Secondary | ICD-10-CM | POA: Diagnosis not present

## 2021-02-17 DIAGNOSIS — K580 Irritable bowel syndrome with diarrhea: Secondary | ICD-10-CM | POA: Diagnosis not present

## 2021-02-17 DIAGNOSIS — E785 Hyperlipidemia, unspecified: Secondary | ICD-10-CM | POA: Diagnosis not present

## 2021-02-17 DIAGNOSIS — K429 Umbilical hernia without obstruction or gangrene: Secondary | ICD-10-CM | POA: Diagnosis not present

## 2021-02-17 DIAGNOSIS — M2042 Other hammer toe(s) (acquired), left foot: Secondary | ICD-10-CM | POA: Diagnosis not present

## 2021-02-19 ENCOUNTER — Other Ambulatory Visit: Payer: Self-pay | Admitting: Internal Medicine

## 2021-02-20 DIAGNOSIS — G2581 Restless legs syndrome: Secondary | ICD-10-CM | POA: Diagnosis not present

## 2021-02-20 DIAGNOSIS — M62830 Muscle spasm of back: Secondary | ICD-10-CM | POA: Diagnosis not present

## 2021-02-20 DIAGNOSIS — E559 Vitamin D deficiency, unspecified: Secondary | ICD-10-CM | POA: Diagnosis not present

## 2021-02-20 DIAGNOSIS — G8929 Other chronic pain: Secondary | ICD-10-CM | POA: Diagnosis not present

## 2021-02-20 DIAGNOSIS — M519 Unspecified thoracic, thoracolumbar and lumbosacral intervertebral disc disorder: Secondary | ICD-10-CM | POA: Diagnosis not present

## 2021-02-20 DIAGNOSIS — M2042 Other hammer toe(s) (acquired), left foot: Secondary | ICD-10-CM | POA: Diagnosis not present

## 2021-02-20 DIAGNOSIS — M25551 Pain in right hip: Secondary | ICD-10-CM | POA: Diagnosis not present

## 2021-02-20 DIAGNOSIS — F32A Depression, unspecified: Secondary | ICD-10-CM | POA: Diagnosis not present

## 2021-02-20 DIAGNOSIS — Z96641 Presence of right artificial hip joint: Secondary | ICD-10-CM | POA: Diagnosis not present

## 2021-02-20 DIAGNOSIS — Z471 Aftercare following joint replacement surgery: Secondary | ICD-10-CM | POA: Diagnosis not present

## 2021-02-20 DIAGNOSIS — G35 Multiple sclerosis: Secondary | ICD-10-CM | POA: Diagnosis not present

## 2021-02-20 DIAGNOSIS — K8020 Calculus of gallbladder without cholecystitis without obstruction: Secondary | ICD-10-CM | POA: Diagnosis not present

## 2021-02-20 DIAGNOSIS — G894 Chronic pain syndrome: Secondary | ICD-10-CM | POA: Diagnosis not present

## 2021-02-20 DIAGNOSIS — M48061 Spinal stenosis, lumbar region without neurogenic claudication: Secondary | ICD-10-CM | POA: Diagnosis not present

## 2021-02-20 DIAGNOSIS — K580 Irritable bowel syndrome with diarrhea: Secondary | ICD-10-CM | POA: Diagnosis not present

## 2021-02-20 DIAGNOSIS — K429 Umbilical hernia without obstruction or gangrene: Secondary | ICD-10-CM | POA: Diagnosis not present

## 2021-02-20 DIAGNOSIS — M7501 Adhesive capsulitis of right shoulder: Secondary | ICD-10-CM | POA: Diagnosis not present

## 2021-02-20 DIAGNOSIS — R7303 Prediabetes: Secondary | ICD-10-CM | POA: Diagnosis not present

## 2021-02-20 DIAGNOSIS — K439 Ventral hernia without obstruction or gangrene: Secondary | ICD-10-CM | POA: Diagnosis not present

## 2021-02-20 DIAGNOSIS — E785 Hyperlipidemia, unspecified: Secondary | ICD-10-CM | POA: Diagnosis not present

## 2021-02-20 DIAGNOSIS — M4720 Other spondylosis with radiculopathy, site unspecified: Secondary | ICD-10-CM | POA: Diagnosis not present

## 2021-02-20 DIAGNOSIS — I10 Essential (primary) hypertension: Secondary | ICD-10-CM | POA: Diagnosis not present

## 2021-02-20 DIAGNOSIS — J449 Chronic obstructive pulmonary disease, unspecified: Secondary | ICD-10-CM | POA: Diagnosis not present

## 2021-02-20 DIAGNOSIS — G4700 Insomnia, unspecified: Secondary | ICD-10-CM | POA: Diagnosis not present

## 2021-02-22 DIAGNOSIS — E559 Vitamin D deficiency, unspecified: Secondary | ICD-10-CM | POA: Diagnosis not present

## 2021-02-22 DIAGNOSIS — K439 Ventral hernia without obstruction or gangrene: Secondary | ICD-10-CM | POA: Diagnosis not present

## 2021-02-22 DIAGNOSIS — M4720 Other spondylosis with radiculopathy, site unspecified: Secondary | ICD-10-CM | POA: Diagnosis not present

## 2021-02-22 DIAGNOSIS — G4700 Insomnia, unspecified: Secondary | ICD-10-CM | POA: Diagnosis not present

## 2021-02-22 DIAGNOSIS — I10 Essential (primary) hypertension: Secondary | ICD-10-CM | POA: Diagnosis not present

## 2021-02-22 DIAGNOSIS — G35 Multiple sclerosis: Secondary | ICD-10-CM | POA: Diagnosis not present

## 2021-02-22 DIAGNOSIS — G2581 Restless legs syndrome: Secondary | ICD-10-CM | POA: Diagnosis not present

## 2021-02-22 DIAGNOSIS — M48061 Spinal stenosis, lumbar region without neurogenic claudication: Secondary | ICD-10-CM | POA: Diagnosis not present

## 2021-02-22 DIAGNOSIS — M519 Unspecified thoracic, thoracolumbar and lumbosacral intervertebral disc disorder: Secondary | ICD-10-CM | POA: Diagnosis not present

## 2021-02-22 DIAGNOSIS — G8929 Other chronic pain: Secondary | ICD-10-CM | POA: Diagnosis not present

## 2021-02-22 DIAGNOSIS — R7303 Prediabetes: Secondary | ICD-10-CM | POA: Diagnosis not present

## 2021-02-22 DIAGNOSIS — K429 Umbilical hernia without obstruction or gangrene: Secondary | ICD-10-CM | POA: Diagnosis not present

## 2021-02-22 DIAGNOSIS — K580 Irritable bowel syndrome with diarrhea: Secondary | ICD-10-CM | POA: Diagnosis not present

## 2021-02-22 DIAGNOSIS — M2042 Other hammer toe(s) (acquired), left foot: Secondary | ICD-10-CM | POA: Diagnosis not present

## 2021-02-22 DIAGNOSIS — E785 Hyperlipidemia, unspecified: Secondary | ICD-10-CM | POA: Diagnosis not present

## 2021-02-22 DIAGNOSIS — K8020 Calculus of gallbladder without cholecystitis without obstruction: Secondary | ICD-10-CM | POA: Diagnosis not present

## 2021-02-22 DIAGNOSIS — J449 Chronic obstructive pulmonary disease, unspecified: Secondary | ICD-10-CM | POA: Diagnosis not present

## 2021-02-22 DIAGNOSIS — Z471 Aftercare following joint replacement surgery: Secondary | ICD-10-CM | POA: Diagnosis not present

## 2021-02-22 DIAGNOSIS — F32A Depression, unspecified: Secondary | ICD-10-CM | POA: Diagnosis not present

## 2021-02-22 DIAGNOSIS — M7501 Adhesive capsulitis of right shoulder: Secondary | ICD-10-CM | POA: Diagnosis not present

## 2021-02-22 DIAGNOSIS — M62830 Muscle spasm of back: Secondary | ICD-10-CM | POA: Diagnosis not present

## 2021-02-22 DIAGNOSIS — Z96641 Presence of right artificial hip joint: Secondary | ICD-10-CM | POA: Diagnosis not present

## 2021-02-27 DIAGNOSIS — E785 Hyperlipidemia, unspecified: Secondary | ICD-10-CM | POA: Diagnosis not present

## 2021-02-27 DIAGNOSIS — G8929 Other chronic pain: Secondary | ICD-10-CM | POA: Diagnosis not present

## 2021-02-27 DIAGNOSIS — R7303 Prediabetes: Secondary | ICD-10-CM | POA: Diagnosis not present

## 2021-02-27 DIAGNOSIS — Z471 Aftercare following joint replacement surgery: Secondary | ICD-10-CM | POA: Diagnosis not present

## 2021-02-27 DIAGNOSIS — I10 Essential (primary) hypertension: Secondary | ICD-10-CM | POA: Diagnosis not present

## 2021-02-27 DIAGNOSIS — K439 Ventral hernia without obstruction or gangrene: Secondary | ICD-10-CM | POA: Diagnosis not present

## 2021-02-27 DIAGNOSIS — G4700 Insomnia, unspecified: Secondary | ICD-10-CM | POA: Diagnosis not present

## 2021-02-27 DIAGNOSIS — K580 Irritable bowel syndrome with diarrhea: Secondary | ICD-10-CM | POA: Diagnosis not present

## 2021-02-27 DIAGNOSIS — M7501 Adhesive capsulitis of right shoulder: Secondary | ICD-10-CM | POA: Diagnosis not present

## 2021-02-27 DIAGNOSIS — M4720 Other spondylosis with radiculopathy, site unspecified: Secondary | ICD-10-CM | POA: Diagnosis not present

## 2021-02-27 DIAGNOSIS — J449 Chronic obstructive pulmonary disease, unspecified: Secondary | ICD-10-CM | POA: Diagnosis not present

## 2021-02-27 DIAGNOSIS — G35 Multiple sclerosis: Secondary | ICD-10-CM | POA: Diagnosis not present

## 2021-02-27 DIAGNOSIS — M2042 Other hammer toe(s) (acquired), left foot: Secondary | ICD-10-CM | POA: Diagnosis not present

## 2021-02-27 DIAGNOSIS — K429 Umbilical hernia without obstruction or gangrene: Secondary | ICD-10-CM | POA: Diagnosis not present

## 2021-02-27 DIAGNOSIS — Z96641 Presence of right artificial hip joint: Secondary | ICD-10-CM | POA: Diagnosis not present

## 2021-02-27 DIAGNOSIS — M519 Unspecified thoracic, thoracolumbar and lumbosacral intervertebral disc disorder: Secondary | ICD-10-CM | POA: Diagnosis not present

## 2021-02-27 DIAGNOSIS — K8020 Calculus of gallbladder without cholecystitis without obstruction: Secondary | ICD-10-CM | POA: Diagnosis not present

## 2021-02-27 DIAGNOSIS — G2581 Restless legs syndrome: Secondary | ICD-10-CM | POA: Diagnosis not present

## 2021-02-27 DIAGNOSIS — M62830 Muscle spasm of back: Secondary | ICD-10-CM | POA: Diagnosis not present

## 2021-02-27 DIAGNOSIS — M48061 Spinal stenosis, lumbar region without neurogenic claudication: Secondary | ICD-10-CM | POA: Diagnosis not present

## 2021-02-27 DIAGNOSIS — E559 Vitamin D deficiency, unspecified: Secondary | ICD-10-CM | POA: Diagnosis not present

## 2021-02-27 DIAGNOSIS — F32A Depression, unspecified: Secondary | ICD-10-CM | POA: Diagnosis not present

## 2021-03-01 DIAGNOSIS — R7303 Prediabetes: Secondary | ICD-10-CM | POA: Diagnosis not present

## 2021-03-01 DIAGNOSIS — G8929 Other chronic pain: Secondary | ICD-10-CM | POA: Diagnosis not present

## 2021-03-01 DIAGNOSIS — E785 Hyperlipidemia, unspecified: Secondary | ICD-10-CM | POA: Diagnosis not present

## 2021-03-01 DIAGNOSIS — M62830 Muscle spasm of back: Secondary | ICD-10-CM | POA: Diagnosis not present

## 2021-03-01 DIAGNOSIS — M519 Unspecified thoracic, thoracolumbar and lumbosacral intervertebral disc disorder: Secondary | ICD-10-CM | POA: Diagnosis not present

## 2021-03-01 DIAGNOSIS — F32A Depression, unspecified: Secondary | ICD-10-CM | POA: Diagnosis not present

## 2021-03-01 DIAGNOSIS — K580 Irritable bowel syndrome with diarrhea: Secondary | ICD-10-CM | POA: Diagnosis not present

## 2021-03-01 DIAGNOSIS — I10 Essential (primary) hypertension: Secondary | ICD-10-CM | POA: Diagnosis not present

## 2021-03-01 DIAGNOSIS — K429 Umbilical hernia without obstruction or gangrene: Secondary | ICD-10-CM | POA: Diagnosis not present

## 2021-03-01 DIAGNOSIS — J449 Chronic obstructive pulmonary disease, unspecified: Secondary | ICD-10-CM | POA: Diagnosis not present

## 2021-03-01 DIAGNOSIS — M7501 Adhesive capsulitis of right shoulder: Secondary | ICD-10-CM | POA: Diagnosis not present

## 2021-03-01 DIAGNOSIS — Z471 Aftercare following joint replacement surgery: Secondary | ICD-10-CM | POA: Diagnosis not present

## 2021-03-01 DIAGNOSIS — K8020 Calculus of gallbladder without cholecystitis without obstruction: Secondary | ICD-10-CM | POA: Diagnosis not present

## 2021-03-01 DIAGNOSIS — G2581 Restless legs syndrome: Secondary | ICD-10-CM | POA: Diagnosis not present

## 2021-03-01 DIAGNOSIS — K439 Ventral hernia without obstruction or gangrene: Secondary | ICD-10-CM | POA: Diagnosis not present

## 2021-03-01 DIAGNOSIS — G4700 Insomnia, unspecified: Secondary | ICD-10-CM | POA: Diagnosis not present

## 2021-03-01 DIAGNOSIS — E559 Vitamin D deficiency, unspecified: Secondary | ICD-10-CM | POA: Diagnosis not present

## 2021-03-01 DIAGNOSIS — Z96641 Presence of right artificial hip joint: Secondary | ICD-10-CM | POA: Diagnosis not present

## 2021-03-01 DIAGNOSIS — M4720 Other spondylosis with radiculopathy, site unspecified: Secondary | ICD-10-CM | POA: Diagnosis not present

## 2021-03-01 DIAGNOSIS — M48061 Spinal stenosis, lumbar region without neurogenic claudication: Secondary | ICD-10-CM | POA: Diagnosis not present

## 2021-03-01 DIAGNOSIS — G35 Multiple sclerosis: Secondary | ICD-10-CM | POA: Diagnosis not present

## 2021-03-01 DIAGNOSIS — M2042 Other hammer toe(s) (acquired), left foot: Secondary | ICD-10-CM | POA: Diagnosis not present

## 2021-03-03 ENCOUNTER — Telehealth: Payer: Self-pay

## 2021-03-03 ENCOUNTER — Other Ambulatory Visit: Payer: Self-pay

## 2021-03-03 ENCOUNTER — Other Ambulatory Visit: Payer: Self-pay | Admitting: Internal Medicine

## 2021-03-03 MED ORDER — GABAPENTIN 300 MG PO CAPS: 900.0000 mg | ORAL_CAPSULE | Freq: Three times a day (TID) | ORAL | 0 refills | Status: DC

## 2021-03-03 NOTE — Telephone Encounter (Signed)
Sent in today 

## 2021-03-03 NOTE — Telephone Encounter (Signed)
1.Medication Requested:Gabapentin   2. Pharmacy (Name, Street, [place]): [facility]  3. On Med List: yes  4. Last Visit with PCP: 01/17/21     Agent: Please be advised that RX refills may take up to 3 business days. We ask that you follow-up with your pharmacy.

## 2021-03-05 ENCOUNTER — Emergency Department (HOSPITAL_BASED_OUTPATIENT_CLINIC_OR_DEPARTMENT_OTHER): Payer: Medicare Other | Admitting: Radiology

## 2021-03-05 ENCOUNTER — Encounter (HOSPITAL_BASED_OUTPATIENT_CLINIC_OR_DEPARTMENT_OTHER): Payer: Self-pay

## 2021-03-05 ENCOUNTER — Other Ambulatory Visit: Payer: Self-pay

## 2021-03-05 ENCOUNTER — Emergency Department (HOSPITAL_BASED_OUTPATIENT_CLINIC_OR_DEPARTMENT_OTHER): Payer: Medicare Other

## 2021-03-05 ENCOUNTER — Emergency Department (HOSPITAL_BASED_OUTPATIENT_CLINIC_OR_DEPARTMENT_OTHER)
Admission: EM | Admit: 2021-03-05 | Discharge: 2021-03-05 | Disposition: A | Discharge status: Home / Self Care | Payer: Medicare Other | Attending: Emergency Medicine | Admitting: Emergency Medicine

## 2021-03-05 DIAGNOSIS — Z96643 Presence of artificial hip joint, bilateral: Secondary | ICD-10-CM | POA: Insufficient documentation

## 2021-03-05 DIAGNOSIS — Z471 Aftercare following joint replacement surgery: Secondary | ICD-10-CM | POA: Diagnosis not present

## 2021-03-05 DIAGNOSIS — F17210 Nicotine dependence, cigarettes, uncomplicated: Secondary | ICD-10-CM | POA: Insufficient documentation

## 2021-03-05 DIAGNOSIS — Z7982 Long term (current) use of aspirin: Secondary | ICD-10-CM | POA: Insufficient documentation

## 2021-03-05 DIAGNOSIS — I10 Essential (primary) hypertension: Secondary | ICD-10-CM | POA: Diagnosis not present

## 2021-03-05 DIAGNOSIS — M25551 Pain in right hip: Secondary | ICD-10-CM | POA: Diagnosis not present

## 2021-03-05 DIAGNOSIS — Z96641 Presence of right artificial hip joint: Secondary | ICD-10-CM | POA: Diagnosis not present

## 2021-03-05 DIAGNOSIS — M545 Low back pain, unspecified: Secondary | ICD-10-CM | POA: Diagnosis not present

## 2021-03-05 DIAGNOSIS — Y92410 Unspecified street and highway as the place of occurrence of the external cause: Secondary | ICD-10-CM | POA: Insufficient documentation

## 2021-03-05 DIAGNOSIS — Z8616 Personal history of COVID-19: Secondary | ICD-10-CM | POA: Insufficient documentation

## 2021-03-05 DIAGNOSIS — Z79899 Other long term (current) drug therapy: Secondary | ICD-10-CM | POA: Insufficient documentation

## 2021-03-05 DIAGNOSIS — M79604 Pain in right leg: Secondary | ICD-10-CM | POA: Diagnosis not present

## 2021-03-05 MED ORDER — FENTANYL CITRATE PF 50 MCG/ML IJ SOSY: 25.0000 ug | PREFILLED_SYRINGE | Freq: Once | INTRAMUSCULAR | Status: DC

## 2021-03-05 MED ORDER — FENTANYL CITRATE PF 50 MCG/ML IJ SOSY
25.0000 ug | PREFILLED_SYRINGE | Freq: Once | INTRAMUSCULAR | Status: DC
  Filled 2021-03-05: qty 1

## 2021-03-05 NOTE — ED Notes (Signed)
Patient self ambulated to the bathroom Warm blanket given
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
## 2021-03-05 NOTE — Discharge Instructions (Signed)
Follow-up with Dr. Lyla Glassing as soon as you can about your visit today.  There is no fracture or dislocation or damage to your hip replacement at this time.  It is possible that there is a fluid collection or infection in the area that you are describing is warm.  It is important that you talk to him about this.  Please return if any of your symptoms worsen.
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
## 2021-03-06 DIAGNOSIS — E559 Vitamin D deficiency, unspecified: Secondary | ICD-10-CM | POA: Diagnosis not present

## 2021-03-06 DIAGNOSIS — R7303 Prediabetes: Secondary | ICD-10-CM | POA: Diagnosis not present

## 2021-03-06 DIAGNOSIS — M4720 Other spondylosis with radiculopathy, site unspecified: Secondary | ICD-10-CM | POA: Diagnosis not present

## 2021-03-06 DIAGNOSIS — M7501 Adhesive capsulitis of right shoulder: Secondary | ICD-10-CM | POA: Diagnosis not present

## 2021-03-06 DIAGNOSIS — Z471 Aftercare following joint replacement surgery: Secondary | ICD-10-CM | POA: Diagnosis not present

## 2021-03-06 DIAGNOSIS — M62830 Muscle spasm of back: Secondary | ICD-10-CM | POA: Diagnosis not present

## 2021-03-06 DIAGNOSIS — G8929 Other chronic pain: Secondary | ICD-10-CM | POA: Diagnosis not present

## 2021-03-06 DIAGNOSIS — E785 Hyperlipidemia, unspecified: Secondary | ICD-10-CM | POA: Diagnosis not present

## 2021-03-06 DIAGNOSIS — G2581 Restless legs syndrome: Secondary | ICD-10-CM | POA: Diagnosis not present

## 2021-03-06 DIAGNOSIS — M48061 Spinal stenosis, lumbar region without neurogenic claudication: Secondary | ICD-10-CM | POA: Diagnosis not present

## 2021-03-06 DIAGNOSIS — K580 Irritable bowel syndrome with diarrhea: Secondary | ICD-10-CM | POA: Diagnosis not present

## 2021-03-06 DIAGNOSIS — F32A Depression, unspecified: Secondary | ICD-10-CM | POA: Diagnosis not present

## 2021-03-06 DIAGNOSIS — J449 Chronic obstructive pulmonary disease, unspecified: Secondary | ICD-10-CM | POA: Diagnosis not present

## 2021-03-06 DIAGNOSIS — K429 Umbilical hernia without obstruction or gangrene: Secondary | ICD-10-CM | POA: Diagnosis not present

## 2021-03-06 DIAGNOSIS — G4700 Insomnia, unspecified: Secondary | ICD-10-CM | POA: Diagnosis not present

## 2021-03-06 DIAGNOSIS — I10 Essential (primary) hypertension: Secondary | ICD-10-CM | POA: Diagnosis not present

## 2021-03-06 DIAGNOSIS — M2042 Other hammer toe(s) (acquired), left foot: Secondary | ICD-10-CM | POA: Diagnosis not present

## 2021-03-06 DIAGNOSIS — Z96641 Presence of right artificial hip joint: Secondary | ICD-10-CM | POA: Diagnosis not present

## 2021-03-06 DIAGNOSIS — G35 Multiple sclerosis: Secondary | ICD-10-CM | POA: Diagnosis not present

## 2021-03-06 DIAGNOSIS — K439 Ventral hernia without obstruction or gangrene: Secondary | ICD-10-CM | POA: Diagnosis not present

## 2021-03-06 DIAGNOSIS — M519 Unspecified thoracic, thoracolumbar and lumbosacral intervertebral disc disorder: Secondary | ICD-10-CM | POA: Diagnosis not present

## 2021-03-06 DIAGNOSIS — K8020 Calculus of gallbladder without cholecystitis without obstruction: Secondary | ICD-10-CM | POA: Diagnosis not present

## 2021-03-07 DIAGNOSIS — S7001XD Contusion of right hip, subsequent encounter: Secondary | ICD-10-CM | POA: Diagnosis not present

## 2021-03-08 DIAGNOSIS — G4700 Insomnia, unspecified: Secondary | ICD-10-CM | POA: Diagnosis not present

## 2021-03-08 DIAGNOSIS — M4720 Other spondylosis with radiculopathy, site unspecified: Secondary | ICD-10-CM | POA: Diagnosis not present

## 2021-03-08 DIAGNOSIS — M519 Unspecified thoracic, thoracolumbar and lumbosacral intervertebral disc disorder: Secondary | ICD-10-CM | POA: Diagnosis not present

## 2021-03-08 DIAGNOSIS — K8020 Calculus of gallbladder without cholecystitis without obstruction: Secondary | ICD-10-CM | POA: Diagnosis not present

## 2021-03-08 DIAGNOSIS — K429 Umbilical hernia without obstruction or gangrene: Secondary | ICD-10-CM | POA: Diagnosis not present

## 2021-03-08 DIAGNOSIS — M7501 Adhesive capsulitis of right shoulder: Secondary | ICD-10-CM | POA: Diagnosis not present

## 2021-03-08 DIAGNOSIS — I10 Essential (primary) hypertension: Secondary | ICD-10-CM | POA: Diagnosis not present

## 2021-03-08 DIAGNOSIS — M2042 Other hammer toe(s) (acquired), left foot: Secondary | ICD-10-CM | POA: Diagnosis not present

## 2021-03-08 DIAGNOSIS — G35 Multiple sclerosis: Secondary | ICD-10-CM | POA: Diagnosis not present

## 2021-03-08 DIAGNOSIS — G8929 Other chronic pain: Secondary | ICD-10-CM | POA: Diagnosis not present

## 2021-03-08 DIAGNOSIS — Z96641 Presence of right artificial hip joint: Secondary | ICD-10-CM | POA: Diagnosis not present

## 2021-03-08 DIAGNOSIS — Z471 Aftercare following joint replacement surgery: Secondary | ICD-10-CM | POA: Diagnosis not present

## 2021-03-08 DIAGNOSIS — M62830 Muscle spasm of back: Secondary | ICD-10-CM | POA: Diagnosis not present

## 2021-03-08 DIAGNOSIS — J449 Chronic obstructive pulmonary disease, unspecified: Secondary | ICD-10-CM | POA: Diagnosis not present

## 2021-03-08 DIAGNOSIS — E785 Hyperlipidemia, unspecified: Secondary | ICD-10-CM | POA: Diagnosis not present

## 2021-03-08 DIAGNOSIS — E559 Vitamin D deficiency, unspecified: Secondary | ICD-10-CM | POA: Diagnosis not present

## 2021-03-08 DIAGNOSIS — M48061 Spinal stenosis, lumbar region without neurogenic claudication: Secondary | ICD-10-CM | POA: Diagnosis not present

## 2021-03-08 DIAGNOSIS — K580 Irritable bowel syndrome with diarrhea: Secondary | ICD-10-CM | POA: Diagnosis not present

## 2021-03-08 DIAGNOSIS — F32A Depression, unspecified: Secondary | ICD-10-CM | POA: Diagnosis not present

## 2021-03-08 DIAGNOSIS — R7303 Prediabetes: Secondary | ICD-10-CM | POA: Diagnosis not present

## 2021-03-08 DIAGNOSIS — G2581 Restless legs syndrome: Secondary | ICD-10-CM | POA: Diagnosis not present

## 2021-03-08 DIAGNOSIS — K439 Ventral hernia without obstruction or gangrene: Secondary | ICD-10-CM | POA: Diagnosis not present

## 2021-03-13 DIAGNOSIS — G2581 Restless legs syndrome: Secondary | ICD-10-CM | POA: Diagnosis not present

## 2021-03-13 DIAGNOSIS — G4700 Insomnia, unspecified: Secondary | ICD-10-CM | POA: Diagnosis not present

## 2021-03-13 DIAGNOSIS — M2042 Other hammer toe(s) (acquired), left foot: Secondary | ICD-10-CM | POA: Diagnosis not present

## 2021-03-13 DIAGNOSIS — K8020 Calculus of gallbladder without cholecystitis without obstruction: Secondary | ICD-10-CM | POA: Diagnosis not present

## 2021-03-13 DIAGNOSIS — K439 Ventral hernia without obstruction or gangrene: Secondary | ICD-10-CM | POA: Diagnosis not present

## 2021-03-13 DIAGNOSIS — K580 Irritable bowel syndrome with diarrhea: Secondary | ICD-10-CM | POA: Diagnosis not present

## 2021-03-13 DIAGNOSIS — G8929 Other chronic pain: Secondary | ICD-10-CM | POA: Diagnosis not present

## 2021-03-13 DIAGNOSIS — M62830 Muscle spasm of back: Secondary | ICD-10-CM | POA: Diagnosis not present

## 2021-03-13 DIAGNOSIS — Z471 Aftercare following joint replacement surgery: Secondary | ICD-10-CM | POA: Diagnosis not present

## 2021-03-13 DIAGNOSIS — G35 Multiple sclerosis: Secondary | ICD-10-CM | POA: Diagnosis not present

## 2021-03-13 DIAGNOSIS — E559 Vitamin D deficiency, unspecified: Secondary | ICD-10-CM | POA: Diagnosis not present

## 2021-03-13 DIAGNOSIS — K429 Umbilical hernia without obstruction or gangrene: Secondary | ICD-10-CM | POA: Diagnosis not present

## 2021-03-13 DIAGNOSIS — J449 Chronic obstructive pulmonary disease, unspecified: Secondary | ICD-10-CM | POA: Diagnosis not present

## 2021-03-13 DIAGNOSIS — R7303 Prediabetes: Secondary | ICD-10-CM | POA: Diagnosis not present

## 2021-03-13 DIAGNOSIS — I10 Essential (primary) hypertension: Secondary | ICD-10-CM | POA: Diagnosis not present

## 2021-03-13 DIAGNOSIS — Z96641 Presence of right artificial hip joint: Secondary | ICD-10-CM | POA: Diagnosis not present

## 2021-03-13 DIAGNOSIS — M7501 Adhesive capsulitis of right shoulder: Secondary | ICD-10-CM | POA: Diagnosis not present

## 2021-03-13 DIAGNOSIS — M48061 Spinal stenosis, lumbar region without neurogenic claudication: Secondary | ICD-10-CM | POA: Diagnosis not present

## 2021-03-13 DIAGNOSIS — M519 Unspecified thoracic, thoracolumbar and lumbosacral intervertebral disc disorder: Secondary | ICD-10-CM | POA: Diagnosis not present

## 2021-03-13 DIAGNOSIS — E785 Hyperlipidemia, unspecified: Secondary | ICD-10-CM | POA: Diagnosis not present

## 2021-03-13 DIAGNOSIS — F32A Depression, unspecified: Secondary | ICD-10-CM | POA: Diagnosis not present

## 2021-03-13 DIAGNOSIS — M4720 Other spondylosis with radiculopathy, site unspecified: Secondary | ICD-10-CM | POA: Diagnosis not present

## 2021-03-15 DIAGNOSIS — M7501 Adhesive capsulitis of right shoulder: Secondary | ICD-10-CM | POA: Diagnosis not present

## 2021-03-15 DIAGNOSIS — Z471 Aftercare following joint replacement surgery: Secondary | ICD-10-CM | POA: Diagnosis not present

## 2021-03-15 DIAGNOSIS — M62830 Muscle spasm of back: Secondary | ICD-10-CM | POA: Diagnosis not present

## 2021-03-15 DIAGNOSIS — G35 Multiple sclerosis: Secondary | ICD-10-CM | POA: Diagnosis not present

## 2021-03-15 DIAGNOSIS — M48061 Spinal stenosis, lumbar region without neurogenic claudication: Secondary | ICD-10-CM | POA: Diagnosis not present

## 2021-03-15 DIAGNOSIS — G2581 Restless legs syndrome: Secondary | ICD-10-CM | POA: Diagnosis not present

## 2021-03-15 DIAGNOSIS — E785 Hyperlipidemia, unspecified: Secondary | ICD-10-CM | POA: Diagnosis not present

## 2021-03-15 DIAGNOSIS — K8020 Calculus of gallbladder without cholecystitis without obstruction: Secondary | ICD-10-CM | POA: Diagnosis not present

## 2021-03-15 DIAGNOSIS — K439 Ventral hernia without obstruction or gangrene: Secondary | ICD-10-CM | POA: Diagnosis not present

## 2021-03-15 DIAGNOSIS — K580 Irritable bowel syndrome with diarrhea: Secondary | ICD-10-CM | POA: Diagnosis not present

## 2021-03-15 DIAGNOSIS — K429 Umbilical hernia without obstruction or gangrene: Secondary | ICD-10-CM | POA: Diagnosis not present

## 2021-03-15 DIAGNOSIS — G4700 Insomnia, unspecified: Secondary | ICD-10-CM | POA: Diagnosis not present

## 2021-03-15 DIAGNOSIS — E559 Vitamin D deficiency, unspecified: Secondary | ICD-10-CM | POA: Diagnosis not present

## 2021-03-15 DIAGNOSIS — R7303 Prediabetes: Secondary | ICD-10-CM | POA: Diagnosis not present

## 2021-03-15 DIAGNOSIS — M2042 Other hammer toe(s) (acquired), left foot: Secondary | ICD-10-CM | POA: Diagnosis not present

## 2021-03-15 DIAGNOSIS — M4720 Other spondylosis with radiculopathy, site unspecified: Secondary | ICD-10-CM | POA: Diagnosis not present

## 2021-03-15 DIAGNOSIS — Z96641 Presence of right artificial hip joint: Secondary | ICD-10-CM | POA: Diagnosis not present

## 2021-03-15 DIAGNOSIS — G8929 Other chronic pain: Secondary | ICD-10-CM | POA: Diagnosis not present

## 2021-03-15 DIAGNOSIS — J449 Chronic obstructive pulmonary disease, unspecified: Secondary | ICD-10-CM | POA: Diagnosis not present

## 2021-03-15 DIAGNOSIS — I10 Essential (primary) hypertension: Secondary | ICD-10-CM | POA: Diagnosis not present

## 2021-03-15 DIAGNOSIS — M519 Unspecified thoracic, thoracolumbar and lumbosacral intervertebral disc disorder: Secondary | ICD-10-CM | POA: Diagnosis not present

## 2021-03-15 DIAGNOSIS — F32A Depression, unspecified: Secondary | ICD-10-CM | POA: Diagnosis not present

## 2021-03-20 DIAGNOSIS — E559 Vitamin D deficiency, unspecified: Secondary | ICD-10-CM | POA: Diagnosis not present

## 2021-03-20 DIAGNOSIS — K580 Irritable bowel syndrome with diarrhea: Secondary | ICD-10-CM | POA: Diagnosis not present

## 2021-03-20 DIAGNOSIS — K8020 Calculus of gallbladder without cholecystitis without obstruction: Secondary | ICD-10-CM | POA: Diagnosis not present

## 2021-03-20 DIAGNOSIS — G35 Multiple sclerosis: Secondary | ICD-10-CM | POA: Diagnosis not present

## 2021-03-20 DIAGNOSIS — G8929 Other chronic pain: Secondary | ICD-10-CM | POA: Diagnosis not present

## 2021-03-20 DIAGNOSIS — M519 Unspecified thoracic, thoracolumbar and lumbosacral intervertebral disc disorder: Secondary | ICD-10-CM | POA: Diagnosis not present

## 2021-03-20 DIAGNOSIS — J449 Chronic obstructive pulmonary disease, unspecified: Secondary | ICD-10-CM | POA: Diagnosis not present

## 2021-03-20 DIAGNOSIS — M4720 Other spondylosis with radiculopathy, site unspecified: Secondary | ICD-10-CM | POA: Diagnosis not present

## 2021-03-20 DIAGNOSIS — M7501 Adhesive capsulitis of right shoulder: Secondary | ICD-10-CM | POA: Diagnosis not present

## 2021-03-20 DIAGNOSIS — F32A Depression, unspecified: Secondary | ICD-10-CM | POA: Diagnosis not present

## 2021-03-20 DIAGNOSIS — M62830 Muscle spasm of back: Secondary | ICD-10-CM | POA: Diagnosis not present

## 2021-03-20 DIAGNOSIS — I10 Essential (primary) hypertension: Secondary | ICD-10-CM | POA: Diagnosis not present

## 2021-03-20 DIAGNOSIS — M2042 Other hammer toe(s) (acquired), left foot: Secondary | ICD-10-CM | POA: Diagnosis not present

## 2021-03-20 DIAGNOSIS — R7303 Prediabetes: Secondary | ICD-10-CM | POA: Diagnosis not present

## 2021-03-20 DIAGNOSIS — G4700 Insomnia, unspecified: Secondary | ICD-10-CM | POA: Diagnosis not present

## 2021-03-20 DIAGNOSIS — Z96641 Presence of right artificial hip joint: Secondary | ICD-10-CM | POA: Diagnosis not present

## 2021-03-20 DIAGNOSIS — Z471 Aftercare following joint replacement surgery: Secondary | ICD-10-CM | POA: Diagnosis not present

## 2021-03-20 DIAGNOSIS — K429 Umbilical hernia without obstruction or gangrene: Secondary | ICD-10-CM | POA: Diagnosis not present

## 2021-03-20 DIAGNOSIS — E785 Hyperlipidemia, unspecified: Secondary | ICD-10-CM | POA: Diagnosis not present

## 2021-03-20 DIAGNOSIS — M48061 Spinal stenosis, lumbar region without neurogenic claudication: Secondary | ICD-10-CM | POA: Diagnosis not present

## 2021-03-20 DIAGNOSIS — K439 Ventral hernia without obstruction or gangrene: Secondary | ICD-10-CM | POA: Diagnosis not present

## 2021-03-20 DIAGNOSIS — G2581 Restless legs syndrome: Secondary | ICD-10-CM | POA: Diagnosis not present

## 2021-03-23 DIAGNOSIS — Z79891 Long term (current) use of opiate analgesic: Secondary | ICD-10-CM | POA: Diagnosis not present

## 2021-03-23 DIAGNOSIS — M160 Bilateral primary osteoarthritis of hip: Secondary | ICD-10-CM | POA: Diagnosis not present

## 2021-03-23 DIAGNOSIS — M87059 Idiopathic aseptic necrosis of unspecified femur: Secondary | ICD-10-CM | POA: Diagnosis not present

## 2021-03-23 DIAGNOSIS — Z79899 Other long term (current) drug therapy: Secondary | ICD-10-CM | POA: Diagnosis not present

## 2021-03-23 DIAGNOSIS — G894 Chronic pain syndrome: Secondary | ICD-10-CM | POA: Diagnosis not present

## 2021-03-23 DIAGNOSIS — M4726 Other spondylosis with radiculopathy, lumbar region: Secondary | ICD-10-CM | POA: Diagnosis not present

## 2021-03-23 DIAGNOSIS — M25551 Pain in right hip: Secondary | ICD-10-CM | POA: Diagnosis not present

## 2021-03-27 DIAGNOSIS — E559 Vitamin D deficiency, unspecified: Secondary | ICD-10-CM | POA: Diagnosis not present

## 2021-03-27 DIAGNOSIS — M62830 Muscle spasm of back: Secondary | ICD-10-CM | POA: Diagnosis not present

## 2021-03-27 DIAGNOSIS — K429 Umbilical hernia without obstruction or gangrene: Secondary | ICD-10-CM | POA: Diagnosis not present

## 2021-03-27 DIAGNOSIS — K8020 Calculus of gallbladder without cholecystitis without obstruction: Secondary | ICD-10-CM | POA: Diagnosis not present

## 2021-03-27 DIAGNOSIS — G35 Multiple sclerosis: Secondary | ICD-10-CM | POA: Diagnosis not present

## 2021-03-27 DIAGNOSIS — K439 Ventral hernia without obstruction or gangrene: Secondary | ICD-10-CM | POA: Diagnosis not present

## 2021-03-27 DIAGNOSIS — Z471 Aftercare following joint replacement surgery: Secondary | ICD-10-CM | POA: Diagnosis not present

## 2021-03-27 DIAGNOSIS — M4720 Other spondylosis with radiculopathy, site unspecified: Secondary | ICD-10-CM | POA: Diagnosis not present

## 2021-03-27 DIAGNOSIS — M7501 Adhesive capsulitis of right shoulder: Secondary | ICD-10-CM | POA: Diagnosis not present

## 2021-03-27 DIAGNOSIS — Z96641 Presence of right artificial hip joint: Secondary | ICD-10-CM | POA: Diagnosis not present

## 2021-03-27 DIAGNOSIS — G8929 Other chronic pain: Secondary | ICD-10-CM | POA: Diagnosis not present

## 2021-03-27 DIAGNOSIS — F32A Depression, unspecified: Secondary | ICD-10-CM | POA: Diagnosis not present

## 2021-03-27 DIAGNOSIS — K580 Irritable bowel syndrome with diarrhea: Secondary | ICD-10-CM | POA: Diagnosis not present

## 2021-03-27 DIAGNOSIS — R7303 Prediabetes: Secondary | ICD-10-CM | POA: Diagnosis not present

## 2021-03-27 DIAGNOSIS — E785 Hyperlipidemia, unspecified: Secondary | ICD-10-CM | POA: Diagnosis not present

## 2021-03-27 DIAGNOSIS — J449 Chronic obstructive pulmonary disease, unspecified: Secondary | ICD-10-CM | POA: Diagnosis not present

## 2021-03-27 DIAGNOSIS — M2042 Other hammer toe(s) (acquired), left foot: Secondary | ICD-10-CM | POA: Diagnosis not present

## 2021-03-27 DIAGNOSIS — M48061 Spinal stenosis, lumbar region without neurogenic claudication: Secondary | ICD-10-CM | POA: Diagnosis not present

## 2021-03-27 DIAGNOSIS — M519 Unspecified thoracic, thoracolumbar and lumbosacral intervertebral disc disorder: Secondary | ICD-10-CM | POA: Diagnosis not present

## 2021-03-27 DIAGNOSIS — G2581 Restless legs syndrome: Secondary | ICD-10-CM | POA: Diagnosis not present

## 2021-03-27 DIAGNOSIS — I10 Essential (primary) hypertension: Secondary | ICD-10-CM | POA: Diagnosis not present

## 2021-03-27 DIAGNOSIS — G4700 Insomnia, unspecified: Secondary | ICD-10-CM | POA: Diagnosis not present

## 2021-03-28 DIAGNOSIS — M25551 Pain in right hip: Secondary | ICD-10-CM | POA: Diagnosis not present

## 2021-03-28 DIAGNOSIS — M79651 Pain in right thigh: Secondary | ICD-10-CM | POA: Diagnosis not present

## 2021-03-28 DIAGNOSIS — R200 Anesthesia of skin: Secondary | ICD-10-CM | POA: Diagnosis not present

## 2021-03-28 DIAGNOSIS — M545 Low back pain, unspecified: Secondary | ICD-10-CM | POA: Diagnosis not present

## 2021-03-29 ENCOUNTER — Other Ambulatory Visit: Payer: Self-pay | Admitting: Internal Medicine

## 2021-04-06 ENCOUNTER — Telehealth: Payer: Self-pay
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
## 2021-04-19 ENCOUNTER — Other Ambulatory Visit: Payer: Self-pay | Admitting: Internal Medicine

## 2021-04-20 DIAGNOSIS — M25551 Pain in right hip: Secondary | ICD-10-CM | POA: Diagnosis not present

## 2021-04-23 DIAGNOSIS — G894 Chronic pain syndrome: Secondary | ICD-10-CM | POA: Diagnosis not present

## 2021-04-23 DIAGNOSIS — M25551 Pain in right hip: Secondary | ICD-10-CM | POA: Diagnosis not present

## 2021-04-24 ENCOUNTER — Telehealth: Payer: Medicare Other

## 2021-04-25 ENCOUNTER — Other Ambulatory Visit: Payer: Self-pay

## 2021-04-25 ENCOUNTER — Ambulatory Visit (INDEPENDENT_AMBULATORY_CARE_PROVIDER_SITE_OTHER): Payer: Medicare Other | Admitting: Podiatry

## 2021-04-25 ENCOUNTER — Encounter: Payer: Self-pay | Admitting: Podiatry

## 2021-04-25 DIAGNOSIS — M2041 Other hammer toe(s) (acquired), right foot: Secondary | ICD-10-CM

## 2021-04-25 DIAGNOSIS — M25551 Pain in right hip: Secondary | ICD-10-CM | POA: Diagnosis not present

## 2021-04-25 DIAGNOSIS — Q828 Other specified congenital malformations of skin: Secondary | ICD-10-CM | POA: Diagnosis not present

## 2021-04-25 DIAGNOSIS — D649 Anemia, unspecified: Secondary | ICD-10-CM | POA: Diagnosis not present

## 2021-04-25 DIAGNOSIS — M79674 Pain in right toe(s): Secondary | ICD-10-CM

## 2021-04-25 DIAGNOSIS — B351 Tinea unguium: Secondary | ICD-10-CM | POA: Insufficient documentation

## 2021-04-25 DIAGNOSIS — M2042 Other hammer toe(s) (acquired), left foot: Secondary | ICD-10-CM | POA: Diagnosis not present

## 2021-04-25 DIAGNOSIS — G622 Polyneuropathy due to other toxic agents: Secondary | ICD-10-CM

## 2021-04-25 NOTE — Progress Notes (Signed)
This patient presents to the office with chief complaint of painful second toe left foot. She says this toe is very painful and the callus is present on the tip of her second toenail left foot is also painful.  She presents to the office for preventative foot care services for nail care.  Patient also has painful callus under the ball of her right foot.  She presents requesting treatment of her second toe pain in addition to long nails.  General Appearance  Alert, conversant and in no acute stress.  Vascular  Dorsalis pedis and posterior tibial  pulses are palpable  bilaterally.  Capillary return is within normal limits  bilaterally. Temperature is within normal limits  bilaterally.  Neurologic  Senn-Weinstein monofilament wire test within normal limits  bilaterally. Muscle power within normal limits bilaterally.  Nails Thick disfigured discolored nails with subungual debris  thick hallux nails.bilaterally. No evidence of bacterial infection or drainage bilaterally.  Orthopedic  No limitations of motion  feet .  No crepitus or effusions noted.  No bony pathology or digital deformities noted. HAV  B/L.  Hammer toes 2-5  B/L especially  second toes both feet.  Skin  normotropic skin noted bilaterally.  No signs of infections or ulcers noted.  Porokeratosis right forefoot.    Porokeratosis right foot.  Hammer toes  B/L.  Debride porokeratosis with # 15 blade.  Debride nails  B/L with nail nipper and dremel tool.  Discussed surgery with this patient and referred to Dr.  March Rummage.   Gardiner Barefoot DPM

## 2021-05-01 DIAGNOSIS — M25551 Pain in right hip: Secondary | ICD-10-CM | POA: Diagnosis not present

## 2021-05-03 DIAGNOSIS — M25551 Pain in right hip: Secondary | ICD-10-CM | POA: Diagnosis not present

## 2021-05-05 ENCOUNTER — Other Ambulatory Visit: Payer: Self-pay | Admitting: Internal Medicine

## 2021-05-05 ENCOUNTER — Ambulatory Visit: Payer: Medicare Other | Admitting: Podiatry

## 2021-05-05 ENCOUNTER — Telehealth: Payer: Self-pay

## 2021-05-05 DIAGNOSIS — M25551 Pain in right hip: Secondary | ICD-10-CM | POA: Diagnosis not present

## 2021-05-05 NOTE — Telephone Encounter (Signed)
Pt is requesting a refill for: gabapentin (NEURONTIN) 300 MG capsule  Pharmacy: [facility] [address], [place] - [address]  LOV 01/17/21  Pt CB [phone]

## 2021-05-05 NOTE — Telephone Encounter (Signed)
Request sent in today already

## 2021-05-08 ENCOUNTER — Ambulatory Visit: Payer: Medicare Other

## 2021-05-08 ENCOUNTER — Ambulatory Visit: Payer: Medicare Other | Admitting: Podiatry

## 2021-05-08 DIAGNOSIS — M25551 Pain in right hip: Secondary | ICD-10-CM | POA: Diagnosis not present

## 2021-05-09 DIAGNOSIS — M160 Bilateral primary osteoarthritis of hip: Secondary | ICD-10-CM | POA: Diagnosis not present

## 2021-05-09 DIAGNOSIS — G894 Chronic pain syndrome: Secondary | ICD-10-CM | POA: Diagnosis not present

## 2021-05-12 DIAGNOSIS — M25551 Pain in right hip: Secondary | ICD-10-CM | POA: Diagnosis not present

## 2021-05-15 DIAGNOSIS — M25551 Pain in right hip: Secondary | ICD-10-CM | POA: Diagnosis not present

## 2021-05-17 DIAGNOSIS — M25551 Pain in right hip: Secondary | ICD-10-CM | POA: Diagnosis not present

## 2021-05-19 DIAGNOSIS — M25551 Pain in right hip: Secondary | ICD-10-CM | POA: Diagnosis not present

## 2021-05-21 DIAGNOSIS — G894 Chronic pain syndrome: Secondary | ICD-10-CM | POA: Diagnosis not present

## 2021-05-21 DIAGNOSIS — M25551 Pain in right hip: Secondary | ICD-10-CM | POA: Diagnosis not present

## 2021-05-22 DIAGNOSIS — M25551 Pain in right hip: Secondary | ICD-10-CM | POA: Diagnosis not present

## 2021-05-26 DIAGNOSIS — M25551 Pain in right hip: Secondary | ICD-10-CM | POA: Diagnosis not present

## 2021-05-29 ENCOUNTER — Telehealth: Payer: Self-pay

## 2021-05-29 ENCOUNTER — Encounter: Payer: Self-pay | Admitting: Internal Medicine

## 2021-05-29 DIAGNOSIS — M25551 Pain in right hip: Secondary | ICD-10-CM | POA: Diagnosis not present

## 2021-05-29 DIAGNOSIS — M7061 Trochanteric bursitis, right hip: Secondary | ICD-10-CM | POA: Diagnosis not present

## 2021-05-29 NOTE — Patient Instructions (Addendum)
° ° ° °  Blood work was ordered.     Medications changes include :      Your prescription(s) have been sent to your pharmacy.    A referral was ordered for XX.     Someone from that office will call you to schedule an appointment.    Return in about 6 months (around 11/30/2021) for CPE.

## 2021-05-29 NOTE — Progress Notes (Signed)
? ? ?Chronic Care Management ?Pharmacy Assistant  ? ?Name: LINDEY RENZULLI  MRN: [id number] DOB: May 11, 1960 ? ?Misty [name] is an 61 y.o. year old female who presents for his follow-up CCM visit with the clinical pharmacist. ? ?Reason for Encounter: Disease State ?  ?Conditions to be addressed/monitored: ?HTN ? ? ?Recent office visits:  ?None ID ? ?Recent consult visits:  ?04/25/21 Gardiner Barefoot, DPM-Podiatry (Nail problems) No orders or med changes ? ?Hospital visits:  ?None since last coordination call ? ?Medications: ?Outpatient Encounter Medications as of 05/29/2021  ?Medication Sig Note  ? acetaminophen (TYLENOL) 500 MG tablet Take 500 mg by mouth every 6 (six) hours as needed for mild pain.   ? albuterol (PROAIR HFA) 108 (90 Base) MCG/ACT inhaler Inhale 1-2 puffs into the lungs every 6 (six) hours as needed for wheezing or shortness of breath.   ? atorvastatin (LIPITOR) 20 MG tablet Take 1 tablet by mouth once daily   ? Cholecalciferol (VITAMIN D3) 50 MCG (2000 UT) capsule Take 1 capsule (2,000 Units total) by mouth daily.   ? cyanocobalamin (,VITAMIN B-12,) 1000 MCG/ML injection Inject 1,000 mcg into the muscle every 30 (thirty) days. 10/28/2022Frances Maywood at the [place] office.  ? diclofenac Sodium (VOLTAREN) 1 % GEL APPLY 2 GRAMS TOPICALLY 4 TIMES DAILY (Patient taking differently: Apply 2 g topically 4 (four) times daily as needed (pain).)   ? docusate sodium (COLACE) 100 MG capsule Take 1 capsule (100 mg total) by mouth 2 (two) times daily as needed for mild constipation. (Patient taking differently: Take 200 mg by mouth daily.)   ? furosemide (LASIX) 40 MG tablet Take 1 tablet (40 mg total) by mouth daily as needed for fluid.   ? gabapentin (NEURONTIN) 300 MG capsule TAKE 3 CAPSULES BY MOUTH THREE TIMES DAILY   ? lisinopril (ZESTRIL) 20 MG tablet Take 1 tablet by mouth once daily   ? meloxicam (MOBIC) 15 MG tablet Take 1 tablet by mouth once daily with food   ? naloxone (NARCAN) nasal spray 4 mg/0.1  mL Place 1 spray into the nose as needed (opioid overdose).   ? ondansetron (ZOFRAN) 4 MG tablet Take 1 tablet (4 mg total) by mouth every 6 (six) hours as needed. for nausea   ? OZEMPIC, 0.25 OR 0.5 MG/DOSE, 2 MG/1.5ML SOPN INJECT 0.25 MG INTO THE SKIN ONCE A WEEK FOR 30 DAYS, THEN 0.5 MG ONCE A WEEK.   ? potassium chloride (KLOR-CON) 10 MEQ tablet Take 2 tablets (20 mEq total) by mouth daily as needed (lasix with lasix).   ? senna (SENOKOT) 8.6 MG TABS tablet Take 1 tablet (8.6 mg total) by mouth 2 (two) times daily.   ? Syringe/Needle, Disp, (SYRINGE 3CC/25GX1") 25G X 1" 3 ML MISC Use for monthly B12 injections   ? XTAMPZA ER 18 MG C12A Take 18 mg by mouth every 12 (twelve) hours.   ? ?No facility-administered encounter medications on file as of 05/29/2021.  ? ?Reviewed chart prior to disease state call. Spoke with patient regarding BP ? ?Recent Office Vitals: ?BP Readings from Last 3 Encounters:  ?03/05/21 (!) 165/80  ?01/27/21 131/71  ?01/19/21 124/71  ? ?Pulse Readings from Last 3 Encounters:  ?03/05/21 82  ?01/27/21 97  ?01/19/21 82  ?  ?Wt Readings from Last 3 Encounters:  ?03/05/21 238 lb (108 kg)  ?01/25/21 233 lb 9.6 oz (106 kg)  ?01/19/21 233 lb 9.6 oz (106 kg)  ?  ? ?Kidney Function ?Lab Results  ?Component Value Date/Time  ?  CREATININE 1.04 (H) 01/26/2021 02:29 PM  ? CREATININE 1.21 (H) 01/26/2021 02:59 AM  ? CREATININE 0.51 08/21/2013 01:37 PM  ? CREATININE 0.49 (L) 08/03/2013 11:27 AM  ? CREATININE 0.49 (L) 08/03/2013 11:27 AM  ? GFR 71.95 09/01/2020 03:45 PM  ? GFRNONAA >60 01/26/2021 02:29 PM  ? GFRNONAA >89 08/03/2013 11:27 AM  ? GFRAA >60 02/26/2018 05:25 AM  ? GFRAA >89 08/03/2013 11:27 AM  ? ? ?BMP Latest Ref Rng & Units 01/26/2021 01/26/2021 01/19/2021  ?Glucose 70 - 99 mg/dL 158(H) 158(H) 78  ?BUN 6 - 20 mg/dL 37(H) 43(H) 23(H)  ?Creatinine 0.44 - 1.00 mg/dL 1.04(H) 1.21(H) 1.26(H)  ?Sodium 135 - 145 mmol/L 137 136 134(L)  ?Potassium 3.5 - 5.1 mmol/L 5.3(H) 6.0(H) 5.1  ?Chloride 98 - 111 mmol/L  110 110 105  ?CO2 22 - 32 mmol/L 21(L) 22 22  ?Calcium 8.9 - 10.3 mg/dL 8.9 8.9 9.9  ? ? ?Current antihypertensive regimen:  ?Lisinopril 20 mg ? ?How often are you checking your Blood Pressure? 3-5x per week always after physical therapy ? ?Current home BP readings: 123/62 ? ?What recent interventions/DTPs have been made by any provider to improve Blood Pressure control since last CPP Visit: None ID ? ?Any recent hospitalizations or ED visits since last visit with CPP? No ? ?What diet changes have been made to improve Blood Pressure Control?  ?Patient states that she tries to stay away from fried foods ? ?What exercise is being done to improve your Blood Pressure Control?  ?Patient states that she exercises every day except Sundays. She walks about an hour a day ? ?Adherence Review: ?Is the patient currently on ACE/ARB medication? Yes ?Does the patient have >5 day gap between last estimated fill dates? No ? ? ?Care Gaps: ?Colonoscopy-09/09/14 ?Diabetic Foot Exam-NA ?Mammogram-01/16/18 ?Ophthalmology-NA ?Dexa Scan - NA ?Annual Well Visit - NA ?Micro albumin-NA ?Hemoglobin A1c- 01/19/21 ?  ?Star Rating Drugs: ?Atorvastatin 20 mg-last fill 03/29/21 90 ds ?Lisinopril 20 mg=last fill 03/29/21 90 ds ?Ozempic 0.25 mg-last fill 12/58/22 56 ds ? ? ?Ethelene Hal ?Clinical Pharmacist Assistant ?[phone]  ?

## 2021-05-29 NOTE — Progress Notes (Unsigned)
Subjective:    Patient ID: Misty [name], female    DOB: Feb 14, 1961, 60 y.o.   MRN: [id number]  This visit occurred during the SARS-CoV-2 public health emergency.  Safety protocols were in place, including screening questions prior to the visit, additional usage of staff PPE, and extensive cleaning of exam room while observing appropriate contact time as indicated for disinfecting solutions.     HPI Misty [name] is here for follow up of her chronic medical problems, including htn, B12 def, spine pain, prediabetes, hld, toxic neuropathy, vit d def  ? On ozempic  ? Need colonoscoy - ? Colon ca h/o  Medications and allergies reviewed with patient and updated if appropriate.  Current Outpatient Medications on File Prior to Visit  Medication Sig Dispense Refill   acetaminophen (TYLENOL) 500 MG tablet Take 500 mg by mouth every 6 (six) hours as needed for mild pain.     albuterol (PROAIR HFA) 108 (90 Base) MCG/ACT inhaler Inhale 1-2 puffs into the lungs every 6 (six) hours as needed for wheezing or shortness of breath. 1 each 5   atorvastatin (LIPITOR) 20 MG tablet Take 1 tablet by mouth once daily 90 tablet 0   Cholecalciferol (VITAMIN D3) 50 MCG (2000 UT) capsule Take 1 capsule (2,000 Units total) by mouth daily. 90 capsule 3   cyanocobalamin (,VITAMIN B-12,) 1000 MCG/ML injection Inject 1,000 mcg into the muscle every 30 (thirty) days.     diclofenac Sodium (VOLTAREN) 1 % GEL APPLY 2 GRAMS TOPICALLY 4 TIMES DAILY (Patient taking differently: Apply 2 g topically 4 (four) times daily as needed (pain).) 100 g 0   docusate sodium (COLACE) 100 MG capsule Take 1 capsule (100 mg total) by mouth 2 (two) times daily as needed for mild constipation. (Patient taking differently: Take 200 mg by mouth daily.) 60 capsule 5   furosemide (LASIX) 40 MG tablet Take 1 tablet (40 mg total) by mouth daily as needed for fluid. 20 tablet 0   gabapentin (NEURONTIN) 300 MG capsule TAKE 3 CAPSULES BY MOUTH THREE  TIMES DAILY 270 capsule 0   lisinopril (ZESTRIL) 20 MG tablet Take 1 tablet by mouth once daily 90 tablet 0   meloxicam (MOBIC) 15 MG tablet Take 1 tablet by mouth once daily with food 90 tablet 0   naloxone (NARCAN) nasal spray 4 mg/0.1 mL Place 1 spray into the nose as needed (opioid overdose).     ondansetron (ZOFRAN) 4 MG tablet Take 1 tablet (4 mg total) by mouth every 6 (six) hours as needed. for nausea 20 tablet 0   OZEMPIC, 0.25 OR 0.5 MG/DOSE, 2 MG/1.5ML SOPN INJECT 0.25 MG INTO THE SKIN ONCE A WEEK FOR 30 DAYS, THEN 0.5 MG ONCE A WEEK. 3 mL 3   potassium chloride (KLOR-CON) 10 MEQ tablet Take 2 tablets (20 mEq total) by mouth daily as needed (lasix with lasix). 30 tablet 0   senna (SENOKOT) 8.6 MG TABS tablet Take 1 tablet (8.6 mg total) by mouth 2 (two) times daily. 120 tablet 0   Syringe/Needle, Disp, (SYRINGE 3CC/25GX1") 25G X 1" 3 ML MISC Use for monthly B12 injections 3 each 3   XTAMPZA ER 18 MG C12A Take 18 mg by mouth every 12 (twelve) hours.     No current facility-administered medications on file prior to visit.     Review of Systems     Objective:  There were no vitals filed for this visit. BP Readings from Last 3 Encounters:  03/05/21 Marland Kitchen)  165/80  01/27/21 131/71  01/19/21 124/71   Wt Readings from Last 3 Encounters:  03/05/21 238 lb (108 kg)  01/25/21 233 lb 9.6 oz (106 kg)  01/19/21 233 lb 9.6 oz (106 kg)   There is no height or weight on file to calculate BMI.    Physical Exam     Lab Results  Component Value Date   WBC 9.5 01/27/2021   HGB 9.3 (L) 01/27/2021   HCT 30.8 (L) 01/27/2021   PLT 161 01/27/2021   GLUCOSE 158 (H) 01/26/2021   CHOL 213 (H) 03/21/2020   TRIG 101.0 03/21/2020   HDL 74.90 03/21/2020   LDLCALC 118 (H) 03/21/2020   ALT 18 01/19/2021   AST 23 01/19/2021   NA 137 01/26/2021   K 5.3 (H) 01/26/2021   CL 110 01/26/2021   CREATININE 1.04 (H) 01/26/2021   BUN 37 (H) 01/26/2021   CO2 21 (L) 01/26/2021   TSH 0.32 (L) 09/01/2020    INR 1.0 01/19/2021   HGBA1C 5.3 01/19/2021     Assessment & Plan:    See Problem List for Assessment and Plan of chronic medical problems.

## 2021-05-30 ENCOUNTER — Other Ambulatory Visit: Payer: Self-pay

## 2021-05-30 ENCOUNTER — Ambulatory Visit (INDEPENDENT_AMBULATORY_CARE_PROVIDER_SITE_OTHER): Payer: Medicare Other | Admitting: Internal Medicine

## 2021-05-30 VITALS — BP 144/90 | HR 104 | Temp 98.4°F | Ht 65.0 in | Wt 256.0 lb

## 2021-05-30 DIAGNOSIS — M48062 Spinal stenosis, lumbar region with neurogenic claudication: Secondary | ICD-10-CM

## 2021-05-30 DIAGNOSIS — E7849 Other hyperlipidemia: Secondary | ICD-10-CM | POA: Diagnosis not present

## 2021-05-30 DIAGNOSIS — E538 Deficiency of other specified B group vitamins: Secondary | ICD-10-CM

## 2021-05-30 DIAGNOSIS — D649 Anemia, unspecified: Secondary | ICD-10-CM | POA: Diagnosis not present

## 2021-05-30 DIAGNOSIS — Z96643 Presence of artificial hip joint, bilateral: Secondary | ICD-10-CM | POA: Insufficient documentation

## 2021-05-30 DIAGNOSIS — G622 Polyneuropathy due to other toxic agents: Secondary | ICD-10-CM | POA: Diagnosis not present

## 2021-05-30 DIAGNOSIS — I10 Essential (primary) hypertension: Secondary | ICD-10-CM | POA: Diagnosis not present

## 2021-05-30 DIAGNOSIS — R7303 Prediabetes: Secondary | ICD-10-CM | POA: Diagnosis not present

## 2021-05-30 DIAGNOSIS — E559 Vitamin D deficiency, unspecified: Secondary | ICD-10-CM | POA: Diagnosis not present

## 2021-05-30 LAB — COMPREHENSIVE METABOLIC PANEL
ALT: 12 U/L (ref 0–35)
AST: 17 U/L (ref 0–37)
Albumin: 4 g/dL (ref 3.5–5.2)
Alkaline Phosphatase: 128 U/L — ABNORMAL HIGH (ref 39–117)
BUN: 15 mg/dL (ref 6–23)
CO2: 27 mEq/L (ref 19–32)
Calcium: 9.5 mg/dL (ref 8.4–10.5)
Chloride: 104 mEq/L (ref 96–112)
Creatinine, Ser: 1.02 mg/dL (ref 0.40–1.20)
GFR: 59.95 mL/min — ABNORMAL LOW (ref >60.00)
Glucose, Bld: 89 mg/dL (ref 70–99)
Potassium: 4.6 mEq/L (ref 3.5–5.1)
Sodium: 139 mEq/L (ref 135–145)
Total Bilirubin: 0.6 mg/dL (ref 0.2–1.2)
Total Protein: 7 g/dL (ref 6.0–8.3)

## 2021-05-30 LAB — CBC WITH DIFFERENTIAL/PLATELET
Basophils Absolute: 0.1 10*3/uL (ref 0.0–0.1)
Basophils Relative: 1.2 % (ref 0.0–3.0)
Eosinophils Absolute: 0.2 10*3/uL (ref 0.0–0.7)
Eosinophils Relative: 3.9 % (ref 0.0–5.0)
HCT: 33.4 % — ABNORMAL LOW (ref 36.0–46.0)
Hemoglobin: 10.6 g/dL — ABNORMAL LOW (ref 12.0–15.0)
Lymphocytes Relative: 29.1 % (ref 12.0–46.0)
Lymphs Abs: 1.7 10*3/uL (ref 0.7–4.0)
MCHC: 31.6 g/dL (ref 30.0–36.0)
MCV: 86.2 fl (ref 78.0–100.0)
Monocytes Absolute: 0.5 10*3/uL (ref 0.1–1.0)
Monocytes Relative: 9.5 % (ref 3.0–12.0)
Neutro Abs: 3.2 10*3/uL (ref 1.4–7.7)
Neutrophils Relative %: 56.3 % (ref 43.0–77.0)
Platelets: 208 10*3/uL (ref 150.0–400.0)
RBC: 3.87 Mil/uL (ref 3.87–5.11)
RDW: 15.6 % — ABNORMAL HIGH (ref 11.5–15.5)
WBC: 5.8 10*3/uL (ref 4.0–10.5)

## 2021-05-30 LAB — LIPID PANEL
Cholesterol: 178 mg/dL (ref 0–200)
HDL: 89.9 mg/dL (ref >39.00)
LDL Cholesterol: 71 mg/dL (ref 0–99)
NonHDL: 88.2
Total CHOL/HDL Ratio: 2
Triglycerides: 85 mg/dL (ref 0.0–149.0)
VLDL: 17 mg/dL (ref 0.0–40.0)

## 2021-05-30 LAB — IBC PANEL
Iron: 40 ug/dL — ABNORMAL LOW (ref 42–145)
Saturation Ratios: 9.7 % — ABNORMAL LOW (ref 20.0–50.0)
TIBC: 411.6 ug/dL (ref 250.0–450.0)
Transferrin: 294 mg/dL (ref 212.0–360.0)

## 2021-05-30 LAB — HEMOGLOBIN A1C: Hgb A1c MFr Bld: 6.1 % (ref 4.6–6.5)

## 2021-05-30 LAB — FERRITIN: Ferritin: 10.8 ng/mL (ref 10.0–291.0)

## 2021-05-30 LAB — VITAMIN D 25 HYDROXY (VIT D DEFICIENCY, FRACTURES): VITD: 41.12 ng/mL (ref 30.00–100.00)

## 2021-05-30 MED ORDER — CYANOCOBALAMIN 1000 MCG/ML IJ SOLN
1000.0000 ug | Freq: Once | INTRAMUSCULAR | Status: AC
  Administered 2021-05-30: 1000 ug via INTRAMUSCULAR

## 2021-05-30 MED ORDER — SEMAGLUTIDE (1 MG/DOSE) 4 MG/3ML ~~LOC~~ SOPN: 1.0000 mg | PEN_INJECTOR | SUBCUTANEOUS | 2 refills | Status: DC

## 2021-05-30 MED ORDER — GABAPENTIN 300 MG PO CAPS: 900.0000 mg | ORAL_CAPSULE | Freq: Three times a day (TID) | ORAL | 2 refills | Status: DC

## 2021-05-30 MED ORDER — TURMERIC 500 MG PO CAPS: ORAL_CAPSULE | ORAL | Status: DC

## 2021-05-30 MED ORDER — CYANOCOBALAMIN 1000 MCG/ML IJ SOLN: 1000.0000 ug | Freq: Once | INTRAMUSCULAR | 1 refills | Status: AC

## 2021-05-30 MED ORDER — "SYRINGE 25G X 1"" 3 ML MISC": 3 refills | Status: AC

## 2021-05-30 NOTE — Assessment & Plan Note (Addendum)
Chronic ?Blood pressure fairly controlled ?CMP ?Continue lisinopril 20 mg daily ?

## 2021-05-30 NOTE — Assessment & Plan Note (Addendum)
Chronic ?Stinging/tingling sensation throughout body ?She still has some tingling sensation-she has not been doing her monthly B12 injections consistently-stressed the importance of this ?Continue gabapentin 900 mg 3 times daily ?

## 2021-05-30 NOTE — Assessment & Plan Note (Signed)
Chronic °Regular exercise and healthy diet encouraged °Check lipid panel  °Continue atorvastatin 20 mg daily °

## 2021-05-30 NOTE — Assessment & Plan Note (Signed)
Chronic Check a1c Low sugar / carb diet Stressed regular exercise  

## 2021-05-30 NOTE — Assessment & Plan Note (Signed)
Chronic Taking vitamin D daily Check vitamin D level  

## 2021-05-30 NOTE — Assessment & Plan Note (Addendum)
Chronic ?B12 deficiency likely secondary to gastric bypass ?B12 injection today ?She has not been doing her monthly B12 injections consistently-stressed the importance of-this this may be contributing to some of her neuropathy-like symptoms ?She has had her daughter give her B12 injections at home in the past and for convenience we will resume this-medication, syringes sent to pharmacy ?B12 injection today ?

## 2021-05-31 NOTE — Assessment & Plan Note (Signed)
Chronic ?She is taking iron daily ?Stressed B12 injections monthly ?Check CBC, iron panel ?Has had a colonoscopy more recently in the noted in the health maintenance-we will get report from [place] ?

## 2021-05-31 NOTE — Assessment & Plan Note (Signed)
Chronic pain ?Lumbar spinal stenosis ?Currently following with pain management ?Has had several different injections in the past ?She is interested in doing PT ?Currently taking Xtampza ER 18 mg every 12 and oxycodone 10 mg 4 times daily as needed ?She has had some issues getting her medication and her doctor left from the other pain clinic so is interested in switching to [place] pain clinic-we will order referral ?

## 2021-06-02 DIAGNOSIS — M25551 Pain in right hip: Secondary | ICD-10-CM | POA: Diagnosis not present

## 2021-06-05 DIAGNOSIS — M25551 Pain in right hip: Secondary | ICD-10-CM | POA: Diagnosis not present

## 2021-06-07 DIAGNOSIS — M25559 Pain in unspecified hip: Secondary | ICD-10-CM | POA: Diagnosis not present

## 2021-06-07 DIAGNOSIS — M25551 Pain in right hip: Secondary | ICD-10-CM | POA: Diagnosis not present

## 2021-06-09 DIAGNOSIS — M25551 Pain in right hip: Secondary | ICD-10-CM | POA: Diagnosis not present

## 2021-06-13 DIAGNOSIS — M7061 Trochanteric bursitis, right hip: Secondary | ICD-10-CM | POA: Diagnosis not present

## 2021-06-13 DIAGNOSIS — M1711 Unilateral primary osteoarthritis, right knee: Secondary | ICD-10-CM | POA: Diagnosis not present

## 2021-06-16 DIAGNOSIS — M25551 Pain in right hip: Secondary | ICD-10-CM | POA: Diagnosis not present

## 2021-06-21 ENCOUNTER — Telehealth: Payer: Medicare Other

## 2021-06-21 DIAGNOSIS — G894 Chronic pain syndrome: Secondary | ICD-10-CM | POA: Diagnosis not present

## 2021-06-21 DIAGNOSIS — M25551 Pain in right hip: Secondary | ICD-10-CM | POA: Diagnosis not present
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
## 2021-06-27 DIAGNOSIS — R7303 Prediabetes: Secondary | ICD-10-CM | POA: Diagnosis not present

## 2021-06-27 DIAGNOSIS — M5416 Radiculopathy, lumbar region: Secondary | ICD-10-CM | POA: Diagnosis not present

## 2021-06-27 DIAGNOSIS — I10 Essential (primary) hypertension: Secondary | ICD-10-CM | POA: Diagnosis not present

## 2021-06-27 DIAGNOSIS — E559 Vitamin D deficiency, unspecified: Secondary | ICD-10-CM | POA: Diagnosis not present

## 2021-06-27 DIAGNOSIS — R030 Elevated blood-pressure reading, without diagnosis of hypertension: Secondary | ICD-10-CM | POA: Diagnosis not present

## 2021-06-27 DIAGNOSIS — G8929 Other chronic pain: Secondary | ICD-10-CM | POA: Diagnosis not present

## 2021-06-27 DIAGNOSIS — R5383 Other fatigue: Secondary | ICD-10-CM | POA: Diagnosis not present

## 2021-06-27 DIAGNOSIS — Z0000 Encounter for general adult medical examination without abnormal findings: Secondary | ICD-10-CM | POA: Diagnosis not present

## 2021-06-27 DIAGNOSIS — M129 Arthropathy, unspecified: Secondary | ICD-10-CM | POA: Diagnosis not present

## 2021-06-27 DIAGNOSIS — Z79899 Other long term (current) drug therapy: Secondary | ICD-10-CM | POA: Diagnosis not present

## 2021-06-30 ENCOUNTER — Other Ambulatory Visit: Payer: Self-pay

## 2021-06-30 ENCOUNTER — Ambulatory Visit: Payer: Medicare Other | Admitting: Internal Medicine

## 2021-06-30 ENCOUNTER — Ambulatory Visit (INDEPENDENT_AMBULATORY_CARE_PROVIDER_SITE_OTHER): Payer: Medicare Other

## 2021-06-30 ENCOUNTER — Telehealth: Payer: Self-pay

## 2021-06-30 DIAGNOSIS — E538 Deficiency of other specified B group vitamins: Secondary | ICD-10-CM | POA: Diagnosis not present

## 2021-06-30 MED ORDER — CYANOCOBALAMIN 1000 MCG/ML IJ SOLN
1000.0000 ug | Freq: Once | INTRAMUSCULAR | Status: AC
  Administered 2021-06-30: 1000 ug via INTRAMUSCULAR

## 2021-06-30 NOTE — Telephone Encounter (Signed)
Spoke with patient today. 

## 2021-06-30 NOTE — Telephone Encounter (Signed)
Did the pain doctor start amlodipine because her blood pressure was high?  If so I do see a couple of elevated blood pressure readings and yes I would recommend that she take the amlodipine in addition to the lisinopril.  Please find out what dose of amlodipine she is taking and added to her med list. ?

## 2021-06-30 NOTE — Progress Notes (Signed)
B12 given Please cosign 

## 2021-07-06 DIAGNOSIS — G8929 Other chronic pain: Secondary | ICD-10-CM | POA: Diagnosis not present

## 2021-07-06 DIAGNOSIS — M5416 Radiculopathy, lumbar region: Secondary | ICD-10-CM | POA: Diagnosis not present

## 2021-07-06 DIAGNOSIS — R748 Abnormal levels of other serum enzymes: Secondary | ICD-10-CM | POA: Diagnosis not present

## 2021-07-06 DIAGNOSIS — Z0130 Encounter for examination of blood pressure without abnormal findings: Secondary | ICD-10-CM | POA: Diagnosis not present

## 2021-07-06 DIAGNOSIS — Z79899 Other long term (current) drug therapy: Secondary | ICD-10-CM | POA: Diagnosis not present

## 2021-07-06 DIAGNOSIS — R7303 Prediabetes: Secondary | ICD-10-CM | POA: Diagnosis not present

## 2021-07-10 ENCOUNTER — Telehealth: Payer: Self-pay | Admitting: Internal Medicine

## 2021-07-10 NOTE — Telephone Encounter (Signed)
Pt states DR at pain management clinic started her on oxycodone 10-325 mg  ? ?Pt states she feels "zoned out" and sleepy when taking the medication  ? ?Pt is hesitant to continue taking the medication and requesting a cb w/ provider's recommendations  ?

## 2021-07-10 NOTE — Telephone Encounter (Signed)
Spoke with patient with today. ? ?She has been advised to contact pain management to discuss.  ? ? ?

## 2021-07-10 NOTE — Telephone Encounter (Signed)
Patient wants to switch to a different pain management doctor. ? ?She is to call me back with name and location. ?

## 2021-07-13 DIAGNOSIS — R748 Abnormal levels of other serum enzymes: Secondary | ICD-10-CM | POA: Diagnosis not present

## 2021-07-14 DIAGNOSIS — M1990 Unspecified osteoarthritis, unspecified site: Secondary | ICD-10-CM | POA: Diagnosis not present

## 2021-07-14 DIAGNOSIS — Z79899 Other long term (current) drug therapy: Secondary | ICD-10-CM | POA: Diagnosis not present

## 2021-07-14 DIAGNOSIS — R030 Elevated blood-pressure reading, without diagnosis of hypertension: Secondary | ICD-10-CM | POA: Diagnosis not present

## 2021-07-14 DIAGNOSIS — I10 Essential (primary) hypertension: Secondary | ICD-10-CM | POA: Diagnosis not present

## 2021-07-14 DIAGNOSIS — M129 Arthropathy, unspecified: Secondary | ICD-10-CM | POA: Diagnosis not present

## 2021-07-18 DIAGNOSIS — F17210 Nicotine dependence, cigarettes, uncomplicated: Secondary | ICD-10-CM | POA: Diagnosis not present

## 2021-07-18 DIAGNOSIS — Z79899 Other long term (current) drug therapy: Secondary | ICD-10-CM | POA: Diagnosis not present

## 2021-07-21 ENCOUNTER — Other Ambulatory Visit: Payer: Self-pay | Admitting: Internal Medicine

## 2021-07-21 DIAGNOSIS — M25551 Pain in right hip: Secondary | ICD-10-CM | POA: Diagnosis not present

## 2021-07-21 DIAGNOSIS — G894 Chronic pain syndrome: Secondary | ICD-10-CM | POA: Diagnosis not present

## 2021-07-25 ENCOUNTER — Encounter: Payer: Self-pay | Admitting: Podiatry

## 2021-07-25 ENCOUNTER — Ambulatory Visit (INDEPENDENT_AMBULATORY_CARE_PROVIDER_SITE_OTHER): Payer: Medicare Other | Admitting: Podiatry

## 2021-07-25 DIAGNOSIS — Q828 Other specified congenital malformations of skin: Secondary | ICD-10-CM

## 2021-07-25 DIAGNOSIS — M2041 Other hammer toe(s) (acquired), right foot: Secondary | ICD-10-CM

## 2021-07-25 DIAGNOSIS — M2042 Other hammer toe(s) (acquired), left foot: Secondary | ICD-10-CM

## 2021-07-25 NOTE — Progress Notes (Signed)
This patient presents to the office with chief complaint of painful second toe left foot. She says this toe is very painful and the callus is present on the tip of her second toenail left foot is also painful.  She presents to the office for preventative foot care services for nail care.  Patient also has painful callus under the ball of her right foot.   General Appearance  Alert, conversant and in no acute stress.  Vascular  Dorsalis pedis and posterior tibial  pulses are palpable  bilaterally.  Capillary return is within normal limits  bilaterally. Temperature is within normal limits  bilaterally.  Neurologic  Senn-Weinstein monofilament wire test within normal limits  bilaterally. Muscle power within normal limits bilaterally.  Nails Thick disfigured discolored nails with subungual debris  thick hallux nails.bilaterally. No evidence of bacterial infection or drainage bilaterally.  Orthopedic  No limitations of motion  feet .  No crepitus or effusions noted.  No bony pathology or digital deformities noted. HAV  B/L.  Hammer toes 2-5  B/L especially  second toes both feet.  Skin  normotropic skin noted bilaterally.  No signs of infections or ulcers noted.  Porokeratosis right forefoot.    Porokeratosis right foot.  Hammer toes  B/L.  Debride porokeratosis with # 15 blade.  Debride nails  B/L with nail nipper and dremel tool.   Noele Icenhour DPM  

## 2021-07-27 DIAGNOSIS — M129 Arthropathy, unspecified: Secondary | ICD-10-CM | POA: Diagnosis not present

## 2021-07-27 DIAGNOSIS — R030 Elevated blood-pressure reading, without diagnosis of hypertension: Secondary | ICD-10-CM | POA: Diagnosis not present

## 2021-07-27 DIAGNOSIS — M1990 Unspecified osteoarthritis, unspecified site: Secondary | ICD-10-CM | POA: Diagnosis not present

## 2021-07-27 DIAGNOSIS — F17210 Nicotine dependence, cigarettes, uncomplicated: Secondary | ICD-10-CM | POA: Diagnosis not present

## 2021-07-27 DIAGNOSIS — R748 Abnormal levels of other serum enzymes: Secondary | ICD-10-CM | POA: Diagnosis not present

## 2021-07-27 DIAGNOSIS — Z79899 Other long term (current) drug therapy: Secondary | ICD-10-CM | POA: Diagnosis not present

## 2021-07-28 DIAGNOSIS — Z79899 Other long term (current) drug therapy: Secondary | ICD-10-CM | POA: Diagnosis not present

## 2021-07-28 DIAGNOSIS — M25561 Pain in right knee: Secondary | ICD-10-CM | POA: Diagnosis not present

## 2021-07-28 DIAGNOSIS — G8929 Other chronic pain: Secondary | ICD-10-CM | POA: Diagnosis not present

## 2021-07-28 DIAGNOSIS — M545 Low back pain, unspecified: Secondary | ICD-10-CM | POA: Diagnosis not present

## 2021-08-07 ENCOUNTER — Telehealth: Payer: Self-pay

## 2021-08-07 ENCOUNTER — Other Ambulatory Visit: Payer: Self-pay

## 2021-08-07 DIAGNOSIS — M7061 Trochanteric bursitis, right hip: Secondary | ICD-10-CM | POA: Diagnosis not present

## 2021-08-07 DIAGNOSIS — M1611 Unilateral primary osteoarthritis, right hip: Secondary | ICD-10-CM | POA: Diagnosis not present

## 2021-08-07 DIAGNOSIS — M1711 Unilateral primary osteoarthritis, right knee: Secondary | ICD-10-CM | POA: Diagnosis not present

## 2021-08-07 MED ORDER — LISINOPRIL 20 MG PO TABS: 20.0000 mg | ORAL_TABLET | Freq: Every day | ORAL | 0 refills | Status: DC

## 2021-08-07 NOTE — Telephone Encounter (Signed)
Pt is requesting a refill on: lisinopril (ZESTRIL) 20 MG tablet  Pharmacy: [facility] [address], [place] - [address]  LOV 05/30/21 ROV 11/30/21

## 2021-08-07 NOTE — Telephone Encounter (Signed)
Sent in today 

## 2021-08-07 NOTE — Telephone Encounter (Signed)
Pt requesting a call back from assistant.   States pharmacy will probably not fill this medication if she doesn't explain why she needs it.   States she lost her medication and is needing a call back.

## 2021-08-08 NOTE — Telephone Encounter (Signed)
Spoke with patient today and she was able to pick up script on yesterday. She paid out of pocket because insurance would not cover it.

## 2021-08-10 ENCOUNTER — Other Ambulatory Visit: Payer: Self-pay | Admitting: Internal Medicine

## 2021-08-10 DIAGNOSIS — Z1231 Encounter for screening mammogram for malignant neoplasm of breast: Secondary | ICD-10-CM

## 2021-08-21 ENCOUNTER — Ambulatory Visit (INDEPENDENT_AMBULATORY_CARE_PROVIDER_SITE_OTHER): Payer: Medicare Other

## 2021-08-21 VITALS — BP 128/72

## 2021-08-21 DIAGNOSIS — G894 Chronic pain syndrome: Secondary | ICD-10-CM | POA: Diagnosis not present

## 2021-08-21 DIAGNOSIS — I10 Essential (primary) hypertension: Secondary | ICD-10-CM

## 2021-08-21 DIAGNOSIS — M545 Low back pain, unspecified: Secondary | ICD-10-CM

## 2021-08-21 DIAGNOSIS — M25551 Pain in right hip: Secondary | ICD-10-CM | POA: Diagnosis not present

## 2021-08-21 DIAGNOSIS — E7849 Other hyperlipidemia: Secondary | ICD-10-CM
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
## 2021-08-21 NOTE — Patient Instructions (Signed)
Visit Information  Following are the goals we discussed today:   Manage My Medicine   Timeframe:  Long-Range Goal Priority:  Medium Start Date:    11/14/20                         Expected End Date:   08/2022                Follow Up Date 02/2022   - call for medicine refill 2 or 3 days before it runs out - call if I am sick and can't take my medicine - keep a list of all the medicines I take; vitamins and herbals too - use a pillbox to sort medicine    Why is this important?   These steps will help you keep on track with your medicines.  Plan: Telephone follow up appointment with care management team member scheduled for:  6 months The patient has been provided with contact information for the care management team and has been advised to call with any health related questions or concerns.   Tomasa Blase, PharmD Clinical Pharmacist, [facility]   Please call the care guide team at [phone] if you need to cancel or reschedule your appointment.   The patient verbalized understanding of instructions, educational materials, and care plan provided today and DECLINED offer to receive copy of patient instructions, educational materials, and care plan.

## 2021-08-24 DIAGNOSIS — G8929 Other chronic pain: Secondary | ICD-10-CM | POA: Diagnosis not present

## 2021-08-24 DIAGNOSIS — Z79899 Other long term (current) drug therapy: Secondary | ICD-10-CM | POA: Diagnosis not present

## 2021-08-24 DIAGNOSIS — M25561 Pain in right knee: Secondary | ICD-10-CM | POA: Diagnosis not present

## 2021-08-24 DIAGNOSIS — M545 Low back pain, unspecified: Secondary | ICD-10-CM | POA: Diagnosis not present

## 2021-08-29 ENCOUNTER — Ambulatory Visit (INDEPENDENT_AMBULATORY_CARE_PROVIDER_SITE_OTHER): Payer: Medicare Other

## 2021-08-29 DIAGNOSIS — Z0000 Encounter for general adult medical examination without abnormal findings: Secondary | ICD-10-CM

## 2021-08-29 DIAGNOSIS — M1711 Unilateral primary osteoarthritis, right knee: Secondary | ICD-10-CM | POA: Diagnosis not present

## 2021-08-29 DIAGNOSIS — M7061 Trochanteric bursitis, right hip: Secondary | ICD-10-CM | POA: Diagnosis not present

## 2021-08-29 DIAGNOSIS — M1611 Unilateral primary osteoarthritis, right hip: Secondary | ICD-10-CM | POA: Diagnosis not present

## 2021-08-29 NOTE — Patient Instructions (Signed)
Misty [name] , Thank you for taking time to come for your Medicare Wellness Visit. I appreciate your ongoing commitment to your health goals. Please review the following plan we discussed and let me know if I can assist you in the future.   Screening recommendations/referrals: Colonoscopy: 01/15/2020; due every 10 years Mammogram: scheduled for 10/19/2021 Bone Density: never done Recommended yearly ophthalmology/optometry visit for glaucoma screening and checkup Recommended yearly dental visit for hygiene and checkup  Vaccinations: Influenza vaccine: 12/26/2020 Pneumococcal vaccine: declined Tdap vaccine: declined Shingles vaccine: declined   Covid-19: 06/15/2019, 07/06/2019, 02/12/2020, 08/29/2020  Advanced directives: Yes; daughter is aware of wishes; Please bring a copy of your health care power of attorney and living will to the office at your convenience.  Conditions/risks identified: Yes  Next appointment: Please schedule your next Medicare Wellness Visit with your Nurse Health Advisor in 1 year by calling [phone].   Preventive Care 38 Years and Older, Female Preventive care refers to lifestyle choices and visits with your health care provider that can promote health and wellness. What does preventive care include? A yearly physical exam. This is also called an annual well check. Dental exams once or twice a year. Routine eye exams. Ask your health care provider how often you should have your eyes checked. Personal lifestyle choices, including: Daily care of your teeth and gums. Regular physical activity. Eating a healthy diet. Avoiding tobacco and drug use. Limiting alcohol use. Practicing safe sex. Taking low-dose aspirin every day. Taking vitamin and mineral supplements as recommended by your health care provider. What happens during an annual well check? The services and screenings done by your health care provider during your annual well check will depend on your  age, overall health, lifestyle risk factors, and family history of disease. Counseling  Your health care provider may ask you questions about your: Alcohol use. Tobacco use. Drug use. Emotional well-being. Home and relationship well-being. Sexual activity. Eating habits. History of falls. Memory and ability to understand (cognition). Work and work Statistician. Reproductive health. Screening  You may have the following tests or measurements: Height, weight, and BMI. Blood pressure. Lipid and cholesterol levels. These may be checked every 5 years, or more frequently if you are over 56 years old. Skin check. Lung cancer screening. You may have this screening every year starting at age 68 if you have a 30-pack-year history of smoking and currently smoke or have quit within the past 15 years. Fecal occult blood test (FOBT) of the stool. You may have this test every year starting at age 22. Flexible sigmoidoscopy or colonoscopy. You may have a sigmoidoscopy every 5 years or a colonoscopy every 10 years starting at age 31. Hepatitis C blood test. Hepatitis B blood test. Sexually transmitted disease (STD) testing. Diabetes screening. This is done by checking your blood sugar (glucose) after you have not eaten for a while (fasting). You may have this done every 1-3 years. Bone density scan. This is done to screen for osteoporosis. You may have this done starting at age 36. Mammogram. This may be done every 1-2 years. Talk to your health care provider about how often you should have regular mammograms. Talk with your health care provider about your test results, treatment options, and if necessary, the need for more tests. Vaccines  Your health care provider may recommend certain vaccines, such as: Influenza vaccine. This is recommended every year. Tetanus, diphtheria, and acellular pertussis (Tdap, Td) vaccine. You may need a Td booster every 10 years. Zoster  vaccine. You may need this after  age 12. Pneumococcal 13-valent conjugate (PCV13) vaccine. One dose is recommended after age 51. Pneumococcal polysaccharide (PPSV23) vaccine. One dose is recommended after age 47. Talk to your health care provider about which screenings and vaccines you need and how often you need them. This information is not intended to replace advice given to you by your health care provider. Make sure you discuss any questions you have with your health care provider. Document Released: 04/01/2015 Document Revised: 11/23/2015 Document Reviewed: 01/04/2015 Elsevier Interactive Patient Education  2017 [employer] Prevention in the Home Falls can cause injuries. They can happen to people of all ages. There are many things you can do to make your home safe and to help prevent falls. What can I do on the outside of my home? Regularly fix the edges of walkways and driveways and fix any cracks. Remove anything that might make you trip as you walk through a door, such as a raised step or threshold. Trim any bushes or trees on the path to your home. Use bright outdoor lighting. Clear any walking paths of anything that might make someone trip, such as rocks or tools. Regularly check to see if handrails are loose or broken. Make sure that both sides of any steps have handrails. Any raised decks and porches should have guardrails on the edges. Have any leaves, snow, or ice cleared regularly. Use sand or salt on walking paths during winter. Clean up any spills in your garage right away. This includes oil or grease spills. What can I do in the bathroom? Use night lights. Install grab bars by the toilet and in the tub and shower. Do not use towel bars as grab bars. Use non-skid mats or decals in the tub or shower. If you need to sit down in the shower, use a plastic, non-slip stool. Keep the floor dry. Clean up any water that spills on the floor as soon as it happens. Remove soap buildup in the tub or shower  regularly. Attach bath mats securely with double-sided non-slip rug tape. Do not have throw rugs and other things on the floor that can make you trip. What can I do in the bedroom? Use night lights. Make sure that you have a light by your bed that is easy to reach. Do not use any sheets or blankets that are too big for your bed. They should not hang down onto the floor. Have a firm chair that has side arms. You can use this for support while you get dressed. Do not have throw rugs and other things on the floor that can make you trip. What can I do in the kitchen? Clean up any spills right away. Avoid walking on wet floors. Keep items that you use a lot in easy-to-reach places. If you need to reach something above you, use a strong step stool that has a grab bar. Keep electrical cords out of the way. Do not use floor polish or wax that makes floors slippery. If you must use wax, use non-skid floor wax. Do not have throw rugs and other things on the floor that can make you trip. What can I do with my stairs? Do not leave any items on the stairs. Make sure that there are handrails on both sides of the stairs and use them. Fix handrails that are broken or loose. Make sure that handrails are as long as the stairways. Check any carpeting to make sure that it  is firmly attached to the stairs. Fix any carpet that is loose or worn. Avoid having throw rugs at the top or bottom of the stairs. If you do have throw rugs, attach them to the floor with carpet tape. Make sure that you have a light switch at the top of the stairs and the bottom of the stairs. If you do not have them, ask someone to add them for you. What else can I do to help prevent falls? Wear shoes that: Do not have high heels. Have rubber bottoms. Are comfortable and fit you well. Are closed at the toe. Do not wear sandals. If you use a stepladder: Make sure that it is fully opened. Do not climb a closed stepladder. Make sure that  both sides of the stepladder are locked into place. Ask someone to hold it for you, if possible. Clearly mark and make sure that you can see: Any grab bars or handrails. First and last steps. Where the edge of each step is. Use tools that help you move around (mobility aids) if they are needed. These include: Canes. Walkers. Scooters. Crutches. Turn on the lights when you go into a dark area. Replace any light bulbs as soon as they burn out. Set up your furniture so you have a clear path. Avoid moving your furniture around. If any of your floors are uneven, fix them. If there are any pets around you, be aware of where they are. Review your medicines with your doctor. Some medicines can make you feel dizzy. This can increase your chance of falling. Ask your doctor what other things that you can do to help prevent falls. This information is not intended to replace advice given to you by your health care provider. Make sure you discuss any questions you have with your health care provider. Document Released: 12/30/2008 Document Revised: 08/11/2015 Document Reviewed: 04/09/2014 Elsevier Interactive Patient Education  2017 [employer].

## 2021-08-29 NOTE — Progress Notes (Signed)
I connected with Misty [name] today by telephone and verified that I am speaking with the correct person using two identifiers. Location patient: home Location provider: work Persons participating in the virtual visit: patient, provider.   I discussed the limitations, risks, security and privacy concerns of performing an evaluation and management service by telephone and the availability of in person appointments. I also discussed with the patient that there may be a patient responsible charge related to this service. The patient expressed understanding and verbally consented to this telephonic visit.    Interactive audio and video telecommunications were attempted between this provider and patient, however failed, due to patient having technical difficulties OR patient did not have access to video capability.  We continued and completed visit with audio only.  Some vital signs may be absent or patient reported.   Time Spent with patient on telephone encounter: 30 minutes  Subjective:   Misty [name] is a 61 y.o. female who presents for Medicare Annual (Subsequent) preventive examination.  Review of Systems     Cardiac Risk Factors include: advanced age (>88mn, >>33women);dyslipidemia;hypertension;obesity (BMI >30kg/m2);family history of premature cardiovascular disease     Objective:    There were no vitals filed for this visit. There is no height or weight on file to calculate BMI.     08/29/2021   12:08 PM 03/05/2021    4:47 PM 01/25/2021   12:55 PM 01/19/2021    9:36 AM 06/02/2020    5:50 PM 06/01/2020    8:06 AM 04/28/2020    4:28 PM  Advanced Directives  Does Patient Have a Medical Advance Directive? Yes No No No No No No  Type of Advance Directive Living will;Healthcare Power of Attorney        Does patient want to make changes to medical advance directive? No - Patient declined        [place]? No - copy requested        Would  patient like information on creating a medical advance directive?   No - Patient declined No - Patient declined No - Patient declined No - Patient declined No - Patient declined    Current Medications (verified) Outpatient Encounter Medications as of 08/29/2021  Medication Sig   albuterol (PROAIR HFA) 108 (90 Base) MCG/ACT inhaler Inhale 1-2 puffs into the lungs every 6 (six) hours as needed for wheezing or shortness of breath.   amLODipine (NORVASC) 2.5 MG tablet Take 2.5 mg by mouth daily.   aspirin EC 81 MG tablet Take 81 mg by mouth daily. Swallow whole.   atorvastatin (LIPITOR) 20 MG tablet Take 1 tablet by mouth once daily   Cholecalciferol (VITAMIN D3) 50 MCG (2000 UT) capsule Take 1 capsule (2,000 Units total) by mouth daily.   cyanocobalamin (,VITAMIN B-12,) 1000 MCG/ML injection Inject 1,000 mcg into the muscle every 30 (thirty) days.   diclofenac Sodium (VOLTAREN) 1 % GEL APPLY 2 GRAMS TOPICALLY 4 TIMES DAILY (Patient taking differently: Apply 2 g topically 4 (four) times daily as needed (pain).)   docusate sodium (COLACE) 100 MG capsule Take 1 capsule (100 mg total) by mouth 2 (two) times daily as needed for mild constipation. (Patient taking differently: Take 200 mg by mouth daily.)   gabapentin (NEURONTIN) 300 MG capsule Take 3 capsules (900 mg total) by mouth 3 (three) times daily. (Patient taking differently: Take 300 mg by mouth 3 (three) times daily. '600mg'$  AM / '600mg'$  afternoon / '900mg'$  HS)  lisinopril (ZESTRIL) 20 MG tablet Take 1 tablet (20 mg total) by mouth daily.   meloxicam (MOBIC) 15 MG tablet Take 1 tablet by mouth once daily with food   naloxone (NARCAN) nasal spray 4 mg/0.1 mL Place 1 spray into the nose as needed (opioid overdose).   ondansetron (ZOFRAN) 4 MG tablet Take 1 tablet (4 mg total) by mouth every 6 (six) hours as needed. for nausea   oxyCODONE-acetaminophen (PERCOCET) 10-325 MG tablet Take 1 tablet by mouth every 6 (six) hours as needed for pain.    Semaglutide, 1 MG/DOSE, 4 MG/3ML SOPN Inject 1 mg as directed once a week.   senna (SENOKOT) 8.6 MG TABS tablet Take 1 tablet (8.6 mg total) by mouth 2 (two) times daily.   Syringe/Needle, Disp, (SYRINGE 3CC/25GX1") 25G X 1" 3 ML MISC Use for monthly B12 injections   XTAMPZA ER 18 MG C12A Take 18 mg by mouth every 12 (twelve) hours.   No facility-administered encounter medications on file as of 08/29/2021.    Allergies (verified) Phenergan [promethazine hcl] and Trazodone and nefazodone   History: Past Medical History:  Diagnosis Date   Allergy    Arthritis    Back pain    Colon cancer (HCC)    Depression    Diarrhea    GERD (gastroesophageal reflux disease)    Hypertension    Insomnia    MS (multiple sclerosis) ([place]) 2000   pt says that she doesn't have it   Other hammer toe (acquired) 03/31/2013   Pneumonia    hx of   Pre-diabetes    Umbilical hernia    Past Surgical History:  Procedure Laterality Date   ABDOMINAL HYSTERECTOMY     ANTERIOR CERVICAL DECOMP/DISCECTOMY FUSION N/A 03/01/2014   Procedure: ANTERIOR CERVICAL DECOMPRESSION/DISCECTOMY FUSION 1 LEVEL;  Surgeon: Charlie Pitter, MD;  Location: [place] [facility];  Service: Neurosurgery;  Laterality: N/A;  ANTERIOR CERVICAL DECOMPRESSION/DISCECTOMY FUSION 1 LEVEL   BUNIONECTOMY Right 10/14/2014   '@[facility]'$    CESAREAN SECTION     COLON SURGERY     COLONOSCOPY     FOOT SURGERY Right    GASTRIC BYPASS     Hammer Toe Repair Right 10/14/2014   RT #2, '@[facility]'$    INCISION AND DRAINAGE HIP Left 06/02/2020   Procedure: IRRIGATION AND DEBRIDEMENT LEFT HIP, POSSIBLE HEAD BALL AND LINER EXCHANGE;  Surgeon: Rod Can, MD;  Location: [facility];  Service: Orthopedics;  Laterality: Left;  55mn   TENOTOMY Right 10/14/2014   RT #3, '@[facility]'$    TOTAL HIP ARTHROPLASTY Left 04/28/2020   Procedure: TOTAL HIP ARTHROPLASTY ANTERIOR APPROACH;  Surgeon: SRod Can MD;  Location: [facility];  Service: Orthopedics;  Laterality: Left;  3E bed   TOTAL HIP  ARTHROPLASTY Right 01/25/2021   Procedure: TOTAL HIP ARTHROPLASTY ANTERIOR APPROACH;  Surgeon: SRod Can MD;  Location: [facility];  Service: Orthopedics;  Laterality: Right;   Family History  Problem Relation Age of Onset   Stroke Mother    Hypertension Mother    Hyperlipidemia Mother    Diabetes Mother    Diabetes Brother    Hypertension Brother    Hypertension Brother    Hypertension Brother    Hypertension Brother    Hypertension Brother    Alcoholism Brother    Social History   Socioeconomic History   Marital status: Single    Spouse name: Not on file   Number of children: 2   Years of education: Not on file   Highest education level: Not  on file  Occupational History   Occupation: Disability  Tobacco Use   Smoking status: Some Days    Packs/day: 0.25    Years: 20.00    Total pack years: 5.00    Types: Cigarettes    Last attempt to quit: 09/22/2018    Years since quitting: 2.9   Smokeless tobacco: Never   Tobacco comments:    states she quit 3 months ago  Vaping Use   Vaping Use: Never used  Substance and Sexual Activity   Alcohol use: Yes    Alcohol/week: 0.0 standard drinks of alcohol    Comment: social   Drug use: No   Sexual activity: Yes    Partners: Male    Birth control/protection: Surgical  Other Topics Concern   Not on file  Social History Narrative   Not on file   Social Determinants of Health   Financial Resource Strain: Low Risk  (08/29/2021)   Overall Financial Resource Strain (CARDIA)    Difficulty of Paying Living Expenses: Not hard at all  Food Insecurity: No Food Insecurity (08/29/2021)   Hunger Vital Sign    Worried About Running Out of Food in the Last Year: Never true    Ran Out of Food in the Last Year: Never true  Transportation Needs: No Transportation Needs (08/29/2021)   PRAPARE - Hydrologist (Medical): No    Lack of Transportation (Non-Medical): No  Physical Activity: Sufficiently Active  (08/29/2021)   Exercise Vital Sign    Days of Exercise per Week: 3 days    Minutes of Exercise per Session: 60 min  Recent Concern: Physical Activity - Inactive (08/29/2021)   Exercise Vital Sign    Days of Exercise per Week: 0 days    Minutes of Exercise per Session: 0 min  Stress: No Stress Concern Present (08/29/2021)   [place]    Feeling of Stress : Not at all  Social Connections: Moderately Integrated (08/29/2021)   Social Connection and Isolation Panel [NHANES]    Frequency of Communication with Friends and Family: More than three times a week    Frequency of Social Gatherings with Friends and Family: More than three times a week    Attends Religious Services: More than 4 times per year    Active Member of [employer] or Organizations: Yes    Attends Music therapist: More than 4 times per year    Marital Status: Never married    Tobacco Counseling Ready to quit: Not Answered Counseling given: Not Answered Tobacco comments: states she quit 3 months ago   Clinical Intake:  Pre-visit preparation completed: Yes  Pain : 0-10 Pain Type: Acute pain Pain Location: Knee Pain Orientation: Left Pain Descriptors / Indicators: Discomfort, Aching Pain Onset: 1 to 4 weeks ago Pain Frequency: Constant Pain Relieving Factors: Injections  Pain Relieving Factors: Injections  BMI - recorded: 42.6 Nutritional Status: BMI > 30  Obese Nutritional Risks: None Diabetes: No  How often do you need to have someone help you when you read instructions, pamphlets, or other written materials from your doctor or pharmacy?: 1 - Never What is the last grade level you completed in school?: Hospitality Mangement Degree  Diabetic? no  Interpreter Needed?: No  Information entered by :: Lisette Abu, LPN.   Activities of Daily Living    08/29/2021   12:18 PM 01/25/2021   12:55 PM  In your present state of health,  do you have any difficulty performing the following activities:  Hearing? 0 0  Vision? 0 0  Difficulty concentrating or making decisions? 0 0  Walking or climbing stairs? 1 1  Comment uses a cane   Dressing or bathing? 0 0  Doing errands, shopping? 0 0  Preparing Food and eating ? N   Using the Toilet? N   In the past six months, have you accidently leaked urine? Y   Comment wears protection overnight   Do you have problems with loss of bowel control? N   Managing your Medications? N   Managing your Finances? N   Housekeeping or managing your Housekeeping? N     Patient Care Team: [name], MD as PCP - General (Internal Medicine) Gardiner Barefoot, DPM as Consulting Physician (Podiatry) Charlton Haws, [facility] as Pharmacist (Pharmacist)  Indicate any recent Medical Services you may have received from other than [facility] providers in the past year (date may be approximate).     Assessment:   This is a routine wellness examination for [facility].  Hearing/Vision screen Hearing Screening - Comments:: No hearing aids. Vision Screening - Comments:: Patient wears eyeglasses. Eye exam done by: [facility]  Dietary issues and exercise activities discussed: Current Exercise Habits: Home exercise routine, Type of exercise: walking;treadmill;stretching;strength training/weights, Time (Minutes): 60, Frequency (Times/Week): 3, Weekly Exercise (Minutes/Week): 180, Intensity: Moderate, Exercise limited by: orthopedic condition(s)   Goals Addressed   None   Depression Screen    08/29/2021   12:16 PM 06/29/2020    4:40 PM 02/19/2020    1:45 PM 12/23/2018    3:35 PM 10/15/2017    2:43 PM 07/10/2016    1:50 PM 06/22/2016    1:09 PM  PHQ 2/9 Scores  PHQ - 2 Score 0 '2 1 2 2 '$ 0 1  PHQ- 9 Score  '3 3 7 10  6    '$ Fall Risk    08/29/2021   12:09 PM 12/23/2018    3:10 PM 10/15/2017    2:43 PM 06/22/2016    1:09 PM 10/11/2015    3:02 PM  Fall Risk   Falls in the past year? 1 1 Yes Yes Yes   Number falls in past yr: '1 1 2 '$ or more 2 or more 2 or more  Injury with Fall? 1 0  Yes No  Comment    shoulder dislocated   Risk Factor Category     High Fall Risk   Risk for fall due to : Orthopedic patient Impaired mobility;Impaired balance/gait;History of fall(s) Impaired mobility;Impaired balance/gait;History of fall(s) Impaired balance/gait   Follow up Falls evaluation completed Falls prevention discussed;Education provided Falls prevention discussed;Education provided Falls prevention discussed;Education provided Falls evaluation completed    FALL RISK PREVENTION PERTAINING TO THE HOME:  Any stairs in or around the home? No  If so, are there any without handrails? No  Home free of loose throw rugs in walkways, pet beds, electrical cords, etc? Yes  Adequate lighting in your home to reduce risk of falls? Yes   ASSISTIVE DEVICES UTILIZED TO PREVENT FALLS:  Life alert? No  Use of a cane, walker or w/c? Yes  Grab bars in the bathroom? Yes  Shower chair or bench in shower? Yes  Elevated toilet seat or a handicapped toilet? Yes   TIMED UP AND GO:  Was the test performed? No .  Length of time to ambulate 10 feet: n/a sec.   Appearance of gait: Patient not evaluated for gait during this  visit.  Cognitive Function:        08/29/2021   12:21 PM  6CIT Screen  What Year? 0 points  What month? 0 points  What time? 0 points  Count back from 20 0 points  Months in reverse 0 points  Repeat phrase 0 points  Total Score 0 points    Immunizations Immunization History  Administered Date(s) Administered   Influenza Whole 04/14/2010   Influenza,inj,Quad PF,6+ Mos 03/01/2014, 01/18/2016, 04/17/2017, 01/13/2018, 11/25/2018, 01/07/2020, 12/26/2020   Influenza-Unspecified 03/16/2015   PFIZER Comirnaty(Gray Top)Covid-19 Tri-Sucrose Vaccine 08/29/2020   PFIZER(Purple Top)SARS-COV-2 Vaccination 02/12/2020   PPD Test 08/06/2017    TDAP status: declined  Flu Vaccine status: Up to  date  Pneumococcal vaccine status: Declined,  Education has been provided regarding the importance of this vaccine but patient still declined. Advised may receive this vaccine at local pharmacy or Health Dept. Aware to provide a copy of the vaccination record if obtained from local pharmacy or Health Dept. Verbalized acceptance and understanding.   Covid-19 vaccine status: Completed vaccines  Qualifies for Shingles Vaccine? Yes   Zostavax completed No   Shingrix Completed?: No.    Education has been provided regarding the importance of this vaccine. Patient has been advised to call insurance company to determine out of pocket expense if they have not yet received this vaccine. Advised may also receive vaccine at local pharmacy or Health Dept. Verbalized acceptance and understanding.  Screening Tests Health Maintenance  Topic Date Due   TETANUS/TDAP  Never done   Zoster Vaccines- Shingrix (1 of 2) Never done   COLONOSCOPY (Pts 45-37yr Insurance coverage will need to be confirmed)  09/09/2019   MAMMOGRAM  01/17/2020   COVID-19 Vaccine (3 - Pfizer risk series) 09/26/2020   INFLUENZA VACCINE  10/17/2021   Hepatitis C Screening  Completed   HIV Screening  Completed   HPV VACCINES  Aged Out    Health Maintenance  Health Maintenance Due  Topic Date Due   TETANUS/TDAP  Never done   Zoster Vaccines- Shingrix (1 of 2) Never done   COLONOSCOPY (Pts 45-473yrInsurance coverage will need to be confirmed)  09/09/2019   MAMMOGRAM  01/17/2020   COVID-19 Vaccine (3 - Pfizer risk series) 09/26/2020    Colorectal cancer screening: Type of screening: Colonoscopy. Completed 01/15/2020. Repeat every 5 years  Mammogram status: Completed 01/16/2018. Repeat every year (Scheduled for 10/19/2021)  Bone Density status: never done  Lung Cancer Screening: (Low Dose CT Chest recommended if Age 60-80ears, 30 pack-year currently smoking OR have quit w/in 15years.) does not qualify.   Lung Cancer Screening  Referral: no  Additional Screening:  Hepatitis C Screening: does qualify; Completed 01/18/2016  Vision Screening: Recommended annual ophthalmology exams for early detection of glaucoma and other disorders of the eye. Is the patient up to date with their annual eye exam?  Yes  Who is the provider or what is the name of the office [facility] which the patient [facility]ends annual [facility] exams[facility] If pt is not established with a provider, would they like to be referred to a provider to establish care? No .   Dental Screening: Recommended annual dental exams for proper oral hygiene  Community Resource Referral / Chronic Care Management: CRR required this visit?  No   CCM required this visit?  No      Plan:     I have personally reviewed and noted the following in the patient's chart:   Medical and social history Use  of alcohol, tobacco or illicit drugs  Current medications and supplements including opioid prescriptions.  Functional ability and status Nutritional status Physical activity Advanced directives List of other physicians Hospitalizations, surgeries, and ER visits in previous 12 months Vitals Screenings to include cognitive, depression, and falls Referrals and appointments  In addition, I have reviewed and discussed with patient certain preventive protocols, quality metrics, and best practice recommendations. A written personalized care plan for preventive services as well as general preventive health recommendations were provided to patient.     Sheral Flow, LPN   [id number]   Nurse Notes:  There were no vitals filed for this visit. There is no height or weight on file to calculate BMI. Medications reviewed with patient; yes opioid use noted.

## 2021-09-15 DIAGNOSIS — E785 Hyperlipidemia, unspecified: Secondary | ICD-10-CM | POA: Diagnosis not present

## 2021-09-15 DIAGNOSIS — I10 Essential (primary) hypertension: Secondary | ICD-10-CM

## 2021-09-25 DIAGNOSIS — M545 Low back pain, unspecified: Secondary | ICD-10-CM | POA: Diagnosis not present

## 2021-09-25 DIAGNOSIS — Z79899 Other long term (current) drug therapy: Secondary | ICD-10-CM | POA: Diagnosis not present

## 2021-09-25 DIAGNOSIS — G8929 Other chronic pain: Secondary | ICD-10-CM | POA: Diagnosis not present

## 2021-09-25 DIAGNOSIS — M25561 Pain in right knee: Secondary | ICD-10-CM | POA: Diagnosis not present

## 2021-09-27 DIAGNOSIS — Z79899 Other long term (current) drug therapy: Secondary | ICD-10-CM | POA: Diagnosis not present

## 2021-10-10 ENCOUNTER — Ambulatory Visit (INDEPENDENT_AMBULATORY_CARE_PROVIDER_SITE_OTHER): Payer: Medicare Other | Admitting: Internal Medicine

## 2021-10-10 ENCOUNTER — Encounter: Payer: Self-pay | Admitting: Internal Medicine

## 2021-10-10 VITALS — BP 128/80 | HR 79 | Resp 18 | Ht 65.0 in | Wt 242.4 lb

## 2021-10-10 DIAGNOSIS — F1010 Alcohol abuse, uncomplicated: Secondary | ICD-10-CM | POA: Diagnosis not present

## 2021-10-10 DIAGNOSIS — E538 Deficiency of other specified B group vitamins: Secondary | ICD-10-CM | POA: Diagnosis not present

## 2021-10-10 DIAGNOSIS — R1011 Right upper quadrant pain: Secondary | ICD-10-CM | POA: Diagnosis not present

## 2021-10-10 LAB — COMPREHENSIVE METABOLIC PANEL
ALT: 17 U/L (ref 0–35)
AST: 25 U/L (ref 0–37)
Albumin: 4.1 g/dL (ref 3.5–5.2)
Alkaline Phosphatase: 134 U/L — ABNORMAL HIGH (ref 39–117)
BUN: 33 mg/dL — ABNORMAL HIGH (ref 6–23)
CO2: 27 mEq/L (ref 19–32)
Calcium: 9.6 mg/dL (ref 8.4–10.5)
Chloride: 103 mEq/L (ref 96–112)
Creatinine, Ser: 1.77 mg/dL — ABNORMAL HIGH (ref 0.40–1.20)
GFR: 30.86 mL/min — ABNORMAL LOW (ref >60.00)
Glucose, Bld: 93 mg/dL (ref 70–99)
Potassium: 4.6 mEq/L (ref 3.5–5.1)
Sodium: 136 mEq/L (ref 135–145)
Total Bilirubin: 0.7 mg/dL (ref 0.2–1.2)
Total Protein: 7.3 g/dL (ref 6.0–8.3)

## 2021-10-10 LAB — CBC
HCT: 36.1 % (ref 36.0–46.0)
Hemoglobin: 11.5 g/dL — ABNORMAL LOW (ref 12.0–15.0)
MCHC: 31.9 g/dL (ref 30.0–36.0)
MCV: 87 fl (ref 78.0–100.0)
Platelets: 212 10*3/uL (ref 150.0–400.0)
RBC: 4.15 Mil/uL (ref 3.87–5.11)
RDW: 15.9 % — ABNORMAL HIGH (ref 11.5–15.5)
WBC: 6.7 10*3/uL (ref 4.0–10.5)

## 2021-10-10 LAB — POCT URINALYSIS DIPSTICK
Blood, UA: NEGATIVE
Glucose, UA: NEGATIVE
Leukocytes, UA: NEGATIVE
Nitrite, UA: NEGATIVE
Protein, UA: POSITIVE — AB
Spec Grav, UA: >=1.03 — AB (ref 1.010–1.025)
Urobilinogen, UA: 0.2 E.U./dL
pH, UA: 5 (ref 5.0–8.0)

## 2021-10-10 LAB — LIPASE: Lipase: 26 U/L (ref 11.0–59.0)

## 2021-10-10 MED ORDER — CYANOCOBALAMIN 1000 MCG/ML IJ SOLN
1000.0000 ug | Freq: Once | INTRAMUSCULAR | Status: AC
  Administered 2021-10-10: 1000 ug via INTRAMUSCULAR

## 2021-10-10 MED ORDER — SULFAMETHOXAZOLE-TRIMETHOPRIM 800-160 MG PO TABS: 1.0000 | ORAL_TABLET | Freq: Two times a day (BID) | ORAL | 0 refills | Status: DC

## 2021-10-10 NOTE — Progress Notes (Signed)
   Subjective:   Patient ID: Misty [name], female    DOB: 1960-12-21, 61 y.o.   MRN: [id number]  HPI The patient is a 61 YO female coming in for right flank pain and feeling like she has to urinate often and feeling unable to empty bladder. Not urinating as often as normal.   Review of Systems  Constitutional: Negative.   HENT: Negative.    Eyes: Negative.   Respiratory:  Negative for cough, chest tightness and shortness of breath.   Cardiovascular:  Negative for chest pain, palpitations and leg swelling.  Gastrointestinal:  Positive for abdominal distention. Negative for abdominal pain, constipation, diarrhea, nausea and vomiting.  Genitourinary:  Positive for difficulty urinating, flank pain and frequency.  Skin: Negative.   Neurological: Negative.   Psychiatric/Behavioral: Negative.      Objective:  Physical Exam Constitutional:      Appearance: She is well-developed.  HENT:     Head: Normocephalic and atraumatic.  Cardiovascular:     Rate and Rhythm: Normal rate and regular rhythm.  Pulmonary:     Effort: Pulmonary effort is normal. No respiratory distress.     Breath sounds: Normal breath sounds. No wheezing or rales.  Abdominal:     General: Bowel sounds are normal. There is no distension.     Palpations: Abdomen is soft.     Tenderness: There is abdominal tenderness. There is no rebound.  Musculoskeletal:     Cervical back: Normal range of motion.  Skin:    General: Skin is warm and dry.  Neurological:     Mental Status: She is alert and oriented to person, place, and time.     Coordination: Coordination normal.     Vitals:   10/10/21 1357  BP: 128/80  Pulse: 79  Resp: 18  SpO2: 92%  Weight: 242 lb 6.4 oz (110 kg)  Height: '5\' 5"'$  (1.651 m)    Assessment & Plan:  B12 given at visit

## 2021-10-10 NOTE — Patient Instructions (Signed)
We have sent in bactrim to take 1 pill twice a day for 5 days.  We are checking the labs and the culture.

## 2021-10-11 ENCOUNTER — Emergency Department (HOSPITAL_BASED_OUTPATIENT_CLINIC_OR_DEPARTMENT_OTHER)
Admission: EM | Admit: 2021-10-11 | Discharge: 2021-10-12 | Disposition: A | Discharge status: Home / Self Care | Payer: Medicare Other | Attending: Emergency Medicine | Admitting: Emergency Medicine

## 2021-10-11 ENCOUNTER — Encounter (HOSPITAL_BASED_OUTPATIENT_CLINIC_OR_DEPARTMENT_OTHER): Payer: Self-pay

## 2021-10-11 ENCOUNTER — Emergency Department (HOSPITAL_BASED_OUTPATIENT_CLINIC_OR_DEPARTMENT_OTHER): Payer: Medicare Other

## 2021-10-11 DIAGNOSIS — Z7982 Long term (current) use of aspirin: Secondary | ICD-10-CM | POA: Insufficient documentation

## 2021-10-11 DIAGNOSIS — Z79899 Other long term (current) drug therapy: Secondary | ICD-10-CM | POA: Diagnosis not present

## 2021-10-11 DIAGNOSIS — N3000 Acute cystitis without hematuria: Secondary | ICD-10-CM | POA: Diagnosis not present

## 2021-10-11 DIAGNOSIS — K8020 Calculus of gallbladder without cholecystitis without obstruction: Secondary | ICD-10-CM | POA: Diagnosis not present

## 2021-10-11 DIAGNOSIS — Z85038 Personal history of other malignant neoplasm of large intestine: Secondary | ICD-10-CM | POA: Diagnosis not present

## 2021-10-11 DIAGNOSIS — I10 Essential (primary) hypertension: Secondary | ICD-10-CM | POA: Insufficient documentation

## 2021-10-11 DIAGNOSIS — R109 Unspecified abdominal pain: Secondary | ICD-10-CM | POA: Diagnosis not present

## 2021-10-11 DIAGNOSIS — R7989 Other specified abnormal findings of blood chemistry: Secondary | ICD-10-CM | POA: Diagnosis not present

## 2021-10-11 DIAGNOSIS — R799 Abnormal finding of blood chemistry, unspecified: Secondary | ICD-10-CM | POA: Diagnosis present

## 2021-10-11 DIAGNOSIS — N179 Acute kidney failure, unspecified: Secondary | ICD-10-CM | POA: Insufficient documentation

## 2021-10-11 LAB — BASIC METABOLIC PANEL
Anion gap: 9 (ref 5–15)
BUN: 36 mg/dL — ABNORMAL HIGH (ref 6–20)
CO2: 23 mmol/L (ref 22–32)
Calcium: 9.7 mg/dL (ref 8.9–10.3)
Chloride: 104 mmol/L (ref 98–111)
Creatinine, Ser: 2.16 mg/dL — ABNORMAL HIGH (ref 0.44–1.00)
GFR, Estimated: 26 mL/min — ABNORMAL LOW (ref >60)
Glucose, Bld: 80 mg/dL (ref 70–99)
Potassium: 4.8 mmol/L (ref 3.5–5.1)
Sodium: 136 mmol/L (ref 135–145)

## 2021-10-11 LAB — CBC WITH DIFFERENTIAL/PLATELET
Abs Immature Granulocytes: 0.01 10*3/uL (ref 0.00–0.07)
Basophils Absolute: 0 10*3/uL (ref 0.0–0.1)
Basophils Relative: 0 %
Eosinophils Absolute: 0.2 10*3/uL (ref 0.0–0.5)
Eosinophils Relative: 3 %
HCT: 36.7 % (ref 36.0–46.0)
Hemoglobin: 11.2 g/dL — ABNORMAL LOW (ref 12.0–15.0)
Immature Granulocytes: 0 %
Lymphocytes Relative: 17 %
Lymphs Abs: 0.9 10*3/uL (ref 0.7–4.0)
MCH: 27.5 pg (ref 26.0–34.0)
MCHC: 30.5 g/dL (ref 30.0–36.0)
MCV: 90 fL (ref 80.0–100.0)
Monocytes Absolute: 0.2 10*3/uL (ref 0.1–1.0)
Monocytes Relative: 5 %
Neutro Abs: 4 10*3/uL (ref 1.7–7.7)
Neutrophils Relative %: 75 %
Platelets: 203 10*3/uL (ref 150–400)
RBC: 4.08 MIL/uL (ref 3.87–5.11)
RDW: 15.3 % (ref 11.5–15.5)
WBC: 5.3 10*3/uL (ref 4.0–10.5)
nRBC: 0 % (ref 0.0–0.2)

## 2021-10-11 LAB — URINE CULTURE

## 2021-10-11 MED ORDER — LACTATED RINGERS IV BOLUS
1000.0000 mL | Freq: Once | INTRAVENOUS | Status: AC
  Administered 2021-10-11: 1000 mL via INTRAVENOUS

## 2021-10-11 NOTE — ED Provider Notes (Signed)
[facility] EMERGENCY DEPT Provider Note   CSN: [id number] Arrival date & time: 10/11/21  2045     History  Chief Complaint  Patient presents with   abnormal labs    Misty [name] is a 61 y.o. female.  Patient is a 61 year old female with a history of hypertension, MS, prior colon cancer, GERD with recurrent UTIs who is presenting today because she was called by her PCP and told that her kidney function was worse from her visit yesterday and they wanted her to be evaluated.  Patient reports for 8 days she was having right-sided flank pain, dysuria, frequency and urgency.  She reports she really was unable to urinate well and she saw her doctor yesterday.  At that time she was diagnosed with a UTI and does report that she has had 2 doses of antibiotic and that she is starting to feel better.  She states she is drinking plenty of fluids denies any fever, vomiting or abdominal pain.  She still has some mild right-sided back pain.  She has no prior history of renal stones.  Because of her creatinine that increased from 1.02-1.77 she was sent for further evaluation.  The history is provided by the patient and medical records.       Home Medications Prior to Admission medications   Medication Sig Start Date End Date Taking? Authorizing Provider  albuterol (PROAIR HFA) 108 (90 Base) MCG/ACT inhaler Inhale 1-2 puffs into the lungs every 6 (six) hours as needed for wheezing or shortness of breath. 04/27/20   [name], MD  amLODipine (NORVASC) 2.5 MG tablet Take 2.5 mg by mouth daily. 06/27/21   [provider]  aspirin EC 81 MG tablet Take 81 mg by mouth daily. Swallow whole.    [provider]  atorvastatin (LIPITOR) 20 MG tablet Take 1 tablet by mouth once daily 07/21/21   [name], MD  Cholecalciferol (VITAMIN D3) 50 MCG (2000 UT) capsule Take 1 capsule (2,000 Units total) by mouth daily. 12/26/20   [name], MD  cyanocobalamin (,VITAMIN  B-12,) 1000 MCG/ML injection Inject 1,000 mcg into the muscle every 30 (thirty) days. 04/16/19   [provider]  diclofenac Sodium (VOLTAREN) 1 % GEL APPLY 2 GRAMS TOPICALLY 4 TIMES DAILY Patient taking differently: Apply 2 g topically 4 (four) times daily as needed (pain). 12/08/19   [name], MD  docusate sodium (COLACE) 100 MG capsule Take 1 capsule (100 mg total) by mouth 2 (two) times daily as needed for mild constipation. Patient taking differently: Take 200 mg by mouth daily. 06/12/19   [name], MD  gabapentin (NEURONTIN) 300 MG capsule Take 3 capsules (900 mg total) by mouth 3 (three) times daily. Patient taking differently: Take 300 mg by mouth 3 (three) times daily. '600mg'$  AM / '600mg'$  afternoon / '900mg'$  HS 05/30/21   Burns, Claudina Lick, MD  lisinopril (ZESTRIL) 20 MG tablet Take 1 tablet (20 mg total) by mouth daily. 08/07/21   [name], MD  meloxicam (MOBIC) 15 MG tablet Take 1 tablet by mouth once daily with food 07/21/21   [name], MD  naloxone [facility]) nasal spray 4 mg/0.1 mL Place 1 spray into the nose as needed (opioid overdose).    [provider]  ondansetron (ZOFRAN) 4 MG tablet Take 1 tablet (4 mg total) by mouth every 6 (six) hours as needed. for nausea 12/26/20   [name], MD  oxyCODONE-acetaminophen (PERCOCET) 10-325 MG  tablet Take 1 tablet by mouth every 6 (six) hours as needed for pain.    [provider]  Semaglutide, 1 MG/DOSE, 4 MG/3ML SOPN Inject 1 mg as directed once a week. 05/30/21   [name], MD  senna (SENOKOT) 8.6 MG TABS tablet Take 1 tablet (8.6 mg total) by mouth 2 (two) times daily. 01/27/21   Dorothyann Peng, PA-C  sulfamethoxazole-trimethoprim (BACTRIM DS) 800-160 MG tablet Take 1 tablet by mouth 2 (two) times daily. 10/10/21   Hoyt Koch, MD  Syringe/Needle, Disp, (SYRINGE 3CC/25GX1") 25G X 1" 3 ML MISC Use for monthly B12 injections 05/30/21   Burns, Claudina Lick, MD  XTAMPZA ER 18 MG C12A Take 18 mg  by mouth every 12 (twelve) hours. 03/10/20   [provider]      Allergies    Phenergan [promethazine hcl] and Trazodone and nefazodone    Review of Systems   Review of Systems  Physical Exam Updated Vital Signs BP 122/76   Pulse 78   Temp 98.3 F (36.8 C) (Oral)   Resp 17   Ht 5' 5.5" (1.664 m)   Wt 109.8 kg   SpO2 98%   BMI 39.66 kg/m  Physical Exam Vitals and nursing note reviewed.  Constitutional:      General: She is not in acute distress.    Appearance: She is well-developed.  HENT:     Head: Normocephalic and atraumatic.  Eyes:     Pupils: Pupils are equal, round, and reactive to light.  Cardiovascular:     Rate and Rhythm: Normal rate and regular rhythm.     Heart sounds: Normal heart sounds. No murmur heard.    No friction rub.  Pulmonary:     Effort: Pulmonary effort is normal.     Breath sounds: Normal breath sounds. No wheezing or rales.  Abdominal:     General: Bowel sounds are normal. There is no distension.     Palpations: Abdomen is soft.     Tenderness: There is no abdominal tenderness. There is right CVA tenderness. There is no guarding or rebound.  Musculoskeletal:        General: No tenderness. Normal range of motion.     Comments: No edema  Skin:    General: Skin is warm and dry.     Findings: No rash.  Neurological:     Mental Status: She is alert and oriented to person, place, and time.     Cranial Nerves: No cranial nerve deficit.  Psychiatric:        Behavior: Behavior normal.     ED Results / Procedures / Treatments   Labs (all labs ordered are listed, but only abnormal results are displayed) Labs Reviewed  BASIC METABOLIC PANEL - Abnormal; Notable for the following components:      Result Value   BUN 36 (*)    Creatinine, Ser 2.16 (*)    GFR, Estimated 26 (*)    All other components within normal limits  CBC WITH DIFFERENTIAL/PLATELET - Abnormal; Notable for the following components:   Hemoglobin 11.2 (*)    All  other components within normal limits    EKG None  Radiology CT Renal Stone Study  Result Date: 10/11/2021 CLINICAL DATA:  Flank pain, kidney stone suspected. Difficulty urinating yesterday. Started an antibiotic and is able to urinate now. Right flank pain. EXAM: CT ABDOMEN AND PELVIS WITHOUT CONTRAST TECHNIQUE: Multidetector CT imaging of the abdomen and pelvis was performed following the standard  protocol without IV contrast. RADIATION DOSE REDUCTION: This exam was performed according to the departmental dose-optimization program which includes automated exposure control, adjustment of the mA and/or kV according to patient size and/or use of iterative reconstruction technique. COMPARISON:  CT abdomen pelvis 02/22/2018 FINDINGS: Lower chest: No acute abnormality. Hepatobiliary: No focal liver abnormality. Calcified gallstone noted within the gallbladder lumen. No gallbladder wall thickening or pericholecystic fluid. No biliary dilatation. Pancreas: No focal lesion. Normal pancreatic contour. No surrounding inflammatory changes. No main pancreatic ductal dilatation. Spleen: Normal in size without focal abnormality. Adrenals/Urinary Tract: No adrenal nodule bilaterally. Similar extra renal right renal pelvis. No nephrolithiasis and no hydronephrosis. No ureterolithiasis or hydroureter. The urinary bladder is decompressed and grossly unremarkable. Stomach/Bowel: Roux-en-Y gastric bypass and partial colectomy surgical changes stomach is within normal limits. No evidence of bowel wall thickening or dilatation. Vascular/Lymphatic: No abdominal aorta or iliac aneurysm. Mild atherosclerotic plaque of the aorta and its branches. No abdominal, pelvic, or inguinal lymphadenopathy. Reproductive: Status post hysterectomy. No adnexal masses. Other: No intraperitoneal free fluid. No intraperitoneal free gas. No organized fluid collection. Musculoskeletal: Ventral wall hernia repair with mesh. No suspicious lytic or  blastic osseous lesions. No acute displaced fracture. Multilevel degenerative changes of the spine. Bilateral total hip arthroplasty. IMPRESSION: 1. No acute intra-intrapelvic abnormality with limited evaluation on this noncontrast study. 2. Cholelithiasis with no CT finding of acute cholecystitis. Electronically Signed   By: Iven Finn M.D.   On: 10/11/2021 22:36    Procedures Procedures    Medications Ordered in ED Medications  lactated ringers bolus 1,000 mL (1,000 mLs Intravenous New Bag/Given 10/11/21 2210)  lactated ringers bolus 1,000 mL (1,000 mLs Intravenous New Bag/Given 10/11/21 2243)    ED Course/ Medical Decision Making/ A&P                           Medical Decision Making Amount and/or Complexity of Data Reviewed External Data Reviewed: notes.    Details: pcp Labs: ordered. Decision-making details documented in ED Course. Radiology: ordered and independent interpretation performed. Decision-making details documented in ED Course.   Pt with multiple medical problems and comorbidities and presenting today with a complaint that caries a high risk for morbidity and mortality.  Here today due to abnormal creatinine levels.  Patient does report history symptoms suggestive of UTI for the last 8 days and saw her PCP yesterday and was given antibiotics for it.  Patient has taken 2 doses of Bactrim.  However because of her elevated creatinine yesterday she was sent here for further evaluation.  Patient reports she has been eating and drinking denies any systemic symptoms at this time.  No prior history of renal stones.  Unfortunately I independently interpreted patient's labs and her BMP today shows worsening creatinine now at 2.16 from 1.77 yesterday.  We will give IV fluids, will also get a renal stone study to ensure no obstructing infected stone.  Also Bactrim was probably not the best choice for a patient with worsening renal function and will most likely need to switch over to  Keflex. Urine culture is pending.  11:10 PM Patient CBC is normal today.  I have independently visualized and interpreted pt's images today.  No evidence of renal stones or hydronephrosis.  Radiology reports negative acute renal CT except for cholelithiasis that is known.  Patient will get 2 L of fluid, repeat BMP.  Patient will need Bactrim changed due to the possibility of it causing  worsening renal function.          Final Clinical Impression(s) / ED Diagnoses Final diagnoses:  None    Rx / DC Orders ED Discharge Orders     None         Blanchie Dessert, MD 10/11/21 2311

## 2021-10-11 NOTE — ED Notes (Signed)
Pt to CT at this time.

## 2021-10-11 NOTE — ED Triage Notes (Signed)
Pt reports going to the doctor yesterday due to difficulty urinating. States that she has felt like she has to urinate, but that when she tries she is unable to go. Was started on an antibiotic yesterday by her PCP and states that she is now able to urinate normally. Also had blood drawn at PCP and they called her today to inform her of decreased kidney function and recommended that she go to the ER for recheck of blood work and to receive IV fluids. Pt here POV by self. A&Ox4 at time of triage. Ambulatory to triage room.  Creatinine 1.77 GFR 30.86

## 2021-10-12 DIAGNOSIS — R1011 Right upper quadrant pain: Secondary | ICD-10-CM | POA: Insufficient documentation

## 2021-10-12 DIAGNOSIS — R7989 Other specified abnormal findings of blood chemistry: Secondary | ICD-10-CM | POA: Diagnosis not present

## 2021-10-12 MED ORDER — CEPHALEXIN 250 MG PO CAPS
500.0000 mg | ORAL_CAPSULE | Freq: Once | ORAL | Status: AC
Start: 2021-10-12 — End: 2021-10-12
  Administered 2021-10-12: 500 mg via ORAL
  Filled 2021-10-12: qty 2

## 2021-10-12 MED ORDER — CEPHALEXIN 500 MG PO CAPS: 500.0000 mg | ORAL_CAPSULE | Freq: Three times a day (TID) | ORAL | 0 refills | Status: DC

## 2021-10-12 NOTE — Assessment & Plan Note (Signed)
Admits to ongoing alcohol usage 3-4 times per week several drinks per time drinking.

## 2021-10-12 NOTE — Discharge Instructions (Signed)
Begin taking Keflex as prescribed.  Stop taking Bactrim.  Drink an extra 3-8 ounce glasses of water per day for the next 2 days.  Follow-up with your primary doctor for a recheck of your metabolic panel/creatinine in the next 2 days.

## 2021-10-12 NOTE — ED Provider Notes (Signed)
  Physical Exam  BP 116/77   Pulse 76   Temp 98.3 F (36.8 C) (Oral)   Resp 17   Ht 5' 5.5" (1.664 m)   Wt 109.8 kg   SpO2 96%   BMI 39.66 kg/m   Physical Exam Vitals and nursing note reviewed.  Constitutional:      General: She is not in acute distress.    Appearance: She is well-developed. She is not diaphoretic.  HENT:     Head: Normocephalic and atraumatic.  Cardiovascular:     Rate and Rhythm: Normal rate and regular rhythm.     Heart sounds: No murmur heard.    No friction rub. No gallop.  Pulmonary:     Effort: Pulmonary effort is normal. No respiratory distress.     Breath sounds: Normal breath sounds. No wheezing.  Abdominal:     General: Bowel sounds are normal. There is no distension.     Palpations: Abdomen is soft.     Tenderness: There is no abdominal tenderness.  Musculoskeletal:        General: Normal range of motion.     Cervical back: Normal range of motion and neck supple.  Skin:    General: Skin is warm and dry.  Neurological:     General: No focal deficit present.     Mental Status: She is alert and oriented to person, place, and time.     Procedures  Procedures  ED Course / MDM  Care assumed from Dr. Maryan Rued at shift change.  Patient referred here by her primary doctor due to a mild bump in her creatinine after being diagnosed with a UTI and started on Bactrim.  Patient has received IV fluids here in the ER.  She is anxious to return home and I feel as though this is reasonable.  I will stop her Bactrim, start Keflex, and have her follow-up with primary doctor in 2 days for a recheck of her creatinine.       Veryl Speak, MD 10/12/21 [phone]

## 2021-10-12 NOTE — Assessment & Plan Note (Signed)
Suspect pyelonephritis or kidney stone causing obstruction. U/A done in office without clear signs of infection but also without blood making kidney stone unclear. Will start antibiotics empirically while awaiting urine culture with bactrim. Checking urine culture, CBC, CMP, lipase to rule out alternate etiology. If no improvement may need imaging to rule out recurrent kidney stone.

## 2021-10-12 NOTE — Assessment & Plan Note (Signed)
Due for B12 injection which is given at visit.

## 2021-10-15 ENCOUNTER — Encounter: Payer: Self-pay | Admitting: Internal Medicine

## 2021-10-15 DIAGNOSIS — N179 Acute kidney failure, unspecified: Secondary | ICD-10-CM | POA: Insufficient documentation

## 2021-10-15 DIAGNOSIS — N183 Chronic kidney disease, stage 3 unspecified: Secondary | ICD-10-CM | POA: Insufficient documentation

## 2021-10-15 NOTE — Progress Notes (Unsigned)
Subjective:    Patient ID: Misty [name], female    DOB: 03-29-1960, 61 y.o.   MRN: [id number]     HPI Misty [name] is here for follow up from the ED.     7/25 - OFV R flank pain, feeling of need to urinate often, unable to empty bladder, urinating less, abd distension.  Ddx pyelo, renal stone UA w/o obvious infection.  Started empirically on abx.  Cr was elevated and she advised to go to the ED.  She was starting to feel better at that time.  IVF given.  CT scan showed no stone.   Bactrim switched to keflex given AKI.    Her culture from 7/25 showed Less than 10,000 CFU/mL of single Gram negative organism.  She was drinking a few nights a week, 3-4 drinks each time.     Medications and allergies reviewed with patient and updated if appropriate.  Current Outpatient Medications on File Prior to Visit  Medication Sig Dispense Refill   albuterol (PROAIR HFA) 108 (90 Base) MCG/ACT inhaler Inhale 1-2 puffs into the lungs every 6 (six) hours as needed for wheezing or shortness of breath. 1 each 5   amLODipine (NORVASC) 2.5 MG tablet Take 2.5 mg by mouth daily.     aspirin EC 81 MG tablet Take 81 mg by mouth daily. Swallow whole.     atorvastatin (LIPITOR) 20 MG tablet Take 1 tablet by mouth once daily 90 tablet 0   cephALEXin (KEFLEX) 500 MG capsule Take 1 capsule (500 mg total) by mouth 3 (three) times daily. 21 capsule 0   Cholecalciferol (VITAMIN D3) 50 MCG (2000 UT) capsule Take 1 capsule (2,000 Units total) by mouth daily. 90 capsule 3   cyanocobalamin (,VITAMIN B-12,) 1000 MCG/ML injection Inject 1,000 mcg into the muscle every 30 (thirty) days.     diclofenac Sodium (VOLTAREN) 1 % GEL APPLY 2 GRAMS TOPICALLY 4 TIMES DAILY (Patient taking differently: Apply 2 g topically 4 (four) times daily as needed (pain).) 100 g 0   docusate sodium (COLACE) 100 MG capsule Take 1 capsule (100 mg total) by mouth 2 (two) times daily as needed for mild constipation. (Patient taking differently:  Take 200 mg by mouth daily.) 60 capsule 5   gabapentin (NEURONTIN) 300 MG capsule Take 3 capsules (900 mg total) by mouth 3 (three) times daily. (Patient taking differently: Take 300 mg by mouth 3 (three) times daily. '600mg'$  AM / '600mg'$  afternoon / '900mg'$  HS) 810 capsule 2   lisinopril (ZESTRIL) 20 MG tablet Take 1 tablet (20 mg total) by mouth daily. 90 tablet 0   meloxicam (MOBIC) 15 MG tablet Take 1 tablet by mouth once daily with food 90 tablet 0   naloxone (NARCAN) nasal spray 4 mg/0.1 mL Place 1 spray into the nose as needed (opioid overdose).     ondansetron (ZOFRAN) 4 MG tablet Take 1 tablet (4 mg total) by mouth every 6 (six) hours as needed. for nausea 20 tablet 0   oxyCODONE-acetaminophen (PERCOCET) 10-325 MG tablet Take 1 tablet by mouth every 6 (six) hours as needed for pain.     Semaglutide, 1 MG/DOSE, 4 MG/3ML SOPN Inject 1 mg as directed once a week. 9 mL 2   senna (SENOKOT) 8.6 MG TABS tablet Take 1 tablet (8.6 mg total) by mouth 2 (two) times daily. 120 tablet 0   sulfamethoxazole-trimethoprim (BACTRIM DS) 800-160 MG tablet Take 1 tablet by mouth 2 (two) times daily. 10 tablet 0  Syringe/Needle, Disp, (SYRINGE 3CC/25GX1") 25G X 1" 3 ML MISC Use for monthly B12 injections 3 each 3   XTAMPZA ER 18 MG C12A Take 18 mg by mouth every 12 (twelve) hours.     No current facility-administered medications on file prior to visit.     Review of Systems     Objective:  There were no vitals filed for this visit. BP Readings from Last 3 Encounters:  10/11/21 116/77  10/10/21 128/80  08/21/21 128/72   Wt Readings from Last 3 Encounters:  10/11/21 242 lb (109.8 kg)  10/10/21 242 lb 6.4 oz (110 kg)  05/30/21 256 lb (116.1 kg)   There is no height or weight on file to calculate BMI.    Physical Exam     Lab Results  Component Value Date   WBC 5.3 10/11/2021   HGB 11.2 (L) 10/11/2021   HCT 36.7 10/11/2021   PLT 203 10/11/2021   GLUCOSE 80 10/11/2021   CHOL 178 05/30/2021    TRIG 85.0 05/30/2021   HDL 89.90 05/30/2021   LDLCALC 71 05/30/2021   ALT 17 10/10/2021   AST 25 10/10/2021   NA 136 10/11/2021   K 4.8 10/11/2021   CL 104 10/11/2021   CREATININE 2.16 (H) 10/11/2021   BUN 36 (H) 10/11/2021   CO2 23 10/11/2021   TSH 0.32 (L) 09/01/2020   INR 1.0 01/19/2021   HGBA1C 6.1 05/30/2021     Assessment & Plan:    See Problem List for Assessment and Plan of chronic medical problems.

## 2021-10-16 ENCOUNTER — Ambulatory Visit (INDEPENDENT_AMBULATORY_CARE_PROVIDER_SITE_OTHER): Payer: Medicare Other | Admitting: Internal Medicine

## 2021-10-16 ENCOUNTER — Other Ambulatory Visit: Payer: Self-pay | Admitting: Internal Medicine

## 2021-10-16 VITALS — BP 130/80 | HR 82 | Temp 98.4°F | Ht 65.5 in | Wt 245.0 lb

## 2021-10-16 DIAGNOSIS — N179 Acute kidney failure, unspecified: Secondary | ICD-10-CM | POA: Diagnosis not present

## 2021-10-16 DIAGNOSIS — D509 Iron deficiency anemia, unspecified: Secondary | ICD-10-CM | POA: Diagnosis not present

## 2021-10-16 DIAGNOSIS — F1010 Alcohol abuse, uncomplicated: Secondary | ICD-10-CM | POA: Diagnosis not present

## 2021-10-16 DIAGNOSIS — I10 Essential (primary) hypertension: Secondary | ICD-10-CM

## 2021-10-16 MED ORDER — ATORVASTATIN CALCIUM 20 MG PO TABS
20.0000 mg | ORAL_TABLET | Freq: Every day | ORAL | 0 refills | Status: DC
Start: 2021-10-16 — End: 2022-06-08

## 2021-10-16 MED ORDER — AMLODIPINE BESYLATE 2.5 MG PO TABS
2.5000 mg | ORAL_TABLET | Freq: Every day | ORAL | 1 refills | Status: DC
Start: 2021-10-16 — End: 2022-04-11

## 2021-10-16 NOTE — Patient Instructions (Addendum)
     Blood work was ordered.  Have this done Friday.    Medications changes include :   hold the meloxicam for now   Your prescription(s) have been sent to your pharmacy.

## 2021-10-16 NOTE — Assessment & Plan Note (Signed)
History of alcohol abuse Recently was drinking a few nights a week, 3-4 drinks each time She states she has decreased her alcohol intake-stressed the dangers of drinking any alcohol which could escalate to having a problem again

## 2021-10-16 NOTE — Assessment & Plan Note (Signed)
Acute Had a probable UTI which may have been the cause-did have an episode of diarrhea 1 week prior to this which could have caused some dehydration as well Was started on Bactrim initially and that may have made her creatinine worse-this was switched to cephalexin She is drinking plenty of fluids Not currently taking meloxicam and I have advised her to hold this until otherwise told BMP Continue increased fluids

## 2021-10-16 NOTE — Assessment & Plan Note (Signed)
History of iron deficiency anemia Taking iron Has increased fatigue recently, which is likely related to her granddaughter-79-monthold baby that is been crying a lot and she is trying to help her daughter with and she has not been sleeping Will check CBC, iron panel

## 2021-10-16 NOTE — Assessment & Plan Note (Signed)
Chronic Blood pressure well controlled BMP Continue lisinopril 20 mg daily

## 2021-10-18 DIAGNOSIS — L299 Pruritus, unspecified: Secondary | ICD-10-CM | POA: Diagnosis not present

## 2021-10-18 DIAGNOSIS — T7840XA Allergy, unspecified, initial encounter: Secondary | ICD-10-CM | POA: Diagnosis not present

## 2021-10-18 DIAGNOSIS — H5789 Other specified disorders of eye and adnexa: Secondary | ICD-10-CM | POA: Diagnosis not present

## 2021-10-19 ENCOUNTER — Ambulatory Visit: Payer: Medicare Other

## 2021-10-25 ENCOUNTER — Ambulatory Visit (INDEPENDENT_AMBULATORY_CARE_PROVIDER_SITE_OTHER): Payer: Medicare Other | Admitting: Podiatry

## 2021-10-25 ENCOUNTER — Encounter: Payer: Self-pay | Admitting: Podiatry

## 2021-10-25 DIAGNOSIS — M2042 Other hammer toe(s) (acquired), left foot: Secondary | ICD-10-CM

## 2021-10-25 DIAGNOSIS — Q828 Other specified congenital malformations of skin: Secondary | ICD-10-CM | POA: Diagnosis not present

## 2021-10-25 DIAGNOSIS — M2041 Other hammer toe(s) (acquired), right foot: Secondary | ICD-10-CM | POA: Diagnosis not present

## 2021-10-25 NOTE — Progress Notes (Signed)
This patient presents to the office with chief complaint of painful second toe left foot. She says this toe is very painful and the callus is present on the tip of her second toenail left foot is also painful.  She presents to the office for preventative foot care services for nail care.  Patient also has painful callus under the ball of her right foot.   General Appearance  Alert, conversant and in no acute stress.  Vascular  Dorsalis pedis and posterior tibial  pulses are palpable  bilaterally.  Capillary return is within normal limits  bilaterally. Temperature is within normal limits  bilaterally.  Neurologic  Senn-Weinstein monofilament wire test within normal limits  bilaterally. Muscle power within normal limits bilaterally.  Nails Thick disfigured discolored nails with subungual debris  thick hallux nails.bilaterally. No evidence of bacterial infection or drainage bilaterally.  Orthopedic  No limitations of motion  feet .  No crepitus or effusions noted.  No bony pathology or digital deformities noted. HAV  B/L.  Hammer toes 2-5  B/L especially  second toes both feet.  Skin  normotropic skin noted bilaterally.  No signs of infections or ulcers noted.  Porokeratosis right forefoot.    Porokeratosis right foot.  Hammer toes  B/L.  Debride porokeratosis with # 15 blade.  Debride nails  B/L with nail nipper and dremel tool.   Gardiner Barefoot DPM

## 2021-10-26 ENCOUNTER — Ambulatory Visit
Admission: RE | Admit: 2021-10-26 | Discharge: 2021-10-26 | Disposition: A | Discharge status: Home / Self Care | Payer: Medicare Other | Source: Ambulatory Visit | Attending: Internal Medicine | Admitting: Internal Medicine

## 2021-10-26 DIAGNOSIS — Z1231 Encounter for screening mammogram for malignant neoplasm of breast: Secondary | ICD-10-CM | POA: Diagnosis not present

## 2021-10-26 DIAGNOSIS — G8929 Other chronic pain: Secondary | ICD-10-CM | POA: Diagnosis not present

## 2021-10-26 DIAGNOSIS — F17210 Nicotine dependence, cigarettes, uncomplicated: Secondary | ICD-10-CM | POA: Diagnosis not present

## 2021-10-26 DIAGNOSIS — Z79899 Other long term (current) drug therapy: Secondary | ICD-10-CM | POA: Diagnosis not present

## 2021-10-26 DIAGNOSIS — M25561 Pain in right knee: Secondary | ICD-10-CM | POA: Diagnosis not present

## 2021-10-26 DIAGNOSIS — M545 Low back pain, unspecified: Secondary | ICD-10-CM | POA: Diagnosis not present

## 2021-11-10 ENCOUNTER — Other Ambulatory Visit: Payer: Self-pay | Admitting: Internal Medicine

## 2021-11-23 DIAGNOSIS — M25561 Pain in right knee: Secondary | ICD-10-CM | POA: Diagnosis not present

## 2021-11-23 DIAGNOSIS — F17210 Nicotine dependence, cigarettes, uncomplicated: Secondary | ICD-10-CM | POA: Diagnosis not present

## 2021-11-23 DIAGNOSIS — M545 Low back pain, unspecified: Secondary | ICD-10-CM | POA: Diagnosis not present

## 2021-11-23 DIAGNOSIS — Z79899 Other long term (current) drug therapy: Secondary | ICD-10-CM | POA: Diagnosis not present

## 2021-11-23 DIAGNOSIS — G8929 Other chronic pain: Secondary | ICD-10-CM | POA: Diagnosis not present

## 2021-11-23 DIAGNOSIS — M25551 Pain in right hip: Secondary | ICD-10-CM | POA: Diagnosis not present

## 2021-11-26 ENCOUNTER — Other Ambulatory Visit: Payer: Self-pay | Admitting: Internal Medicine

## 2021-11-29 ENCOUNTER — Encounter: Payer: Self-pay | Admitting: Internal Medicine

## 2021-11-29 NOTE — Patient Instructions (Addendum)
B12 and flu vaccine today   Blood work was ordered.     Medications changes include :   stop meloxicam.  Do not take any nsaids ( advil, aleve, ibuprofen)     Return in about 6 months (around 05/31/2022) for follow up.   Health Maintenance, Female Adopting a healthy lifestyle and getting preventive care are important in promoting health and wellness. Ask your health care provider about: The right schedule for you to have regular tests and exams. Things you can do on your own to prevent diseases and keep yourself healthy. What should I know about diet, weight, and exercise? Eat a healthy diet  Eat a diet that includes plenty of vegetables, fruits, low-fat dairy products, and lean protein. Do not eat a lot of foods that are high in solid fats, added sugars, or sodium. Maintain a healthy weight Body mass index (BMI) is used to identify weight problems. It estimates body fat based on height and weight. Your health care provider can help determine your BMI and help you achieve or maintain a healthy weight. Get regular exercise Get regular exercise. This is one of the most important things you can do for your health. Most adults should: Exercise for at least 150 minutes each week. The exercise should increase your heart rate and make you sweat (moderate-intensity exercise). Do strengthening exercises at least twice a week. This is in addition to the moderate-intensity exercise. Spend less time sitting. Even light physical activity can be beneficial. Watch cholesterol and blood lipids Have your blood tested for lipids and cholesterol at 60 years of age, then have this test every 5 years. Have your cholesterol levels checked more often if: Your lipid or cholesterol levels are high. You are older than 60 years of age. You are at high risk for heart disease. What should I know about cancer screening? Depending on your health history and family history, you may need to have cancer  screening at various ages. This may include screening for: Breast cancer. Cervical cancer. Colorectal cancer. Skin cancer. Lung cancer. What should I know about heart disease, diabetes, and high blood pressure? Blood pressure and heart disease High blood pressure causes heart disease and increases the risk of stroke. This is more likely to develop in people who have high blood pressure readings or are overweight. Have your blood pressure checked: Every 3-5 years if you are 84-60 years of age. Every year if you are 60 years old or older. Diabetes Have regular diabetes screenings. This checks your fasting blood sugar level. Have the screening done: Once every three years after age 17 if you are at a normal weight and have a low risk for diabetes. More often and at a younger age if you are overweight or have a high risk for diabetes. What should I know about preventing infection? Hepatitis B If you have a higher risk for hepatitis B, you should be screened for this virus. Talk with your health care provider to find out if you are at risk for hepatitis B infection. Hepatitis C Testing is recommended for: Everyone born from [birth year] through 1965. Anyone with known risk factors for hepatitis C. Sexually transmitted infections (STIs) Get screened for STIs, including gonorrhea and chlamydia, if: You are sexually active and are younger than 60 years of age. You are older than 60 years of age and your health care provider tells you that you are at risk for this type of infection. Your sexual activity has changed since  you were last screened, and you are at increased risk for chlamydia or gonorrhea. Ask your health care provider if you are at risk. Ask your health care provider about whether you are at high risk for HIV. Your health care provider may recommend a prescription medicine to help prevent HIV infection. If you choose to take medicine to prevent HIV, you should first get tested for HIV. You  should then be tested every 3 months for as long as you are taking the medicine. Pregnancy If you are about to stop having your period (premenopausal) and you may become pregnant, seek counseling before you get pregnant. Take 400 to 800 micrograms (mcg) of folic acid every day if you become pregnant. Ask for birth control (contraception) if you want to prevent pregnancy. Osteoporosis and menopause Osteoporosis is a disease in which the bones lose minerals and strength with aging. This can result in bone fractures. If you are 60 years old or older, or if you are at risk for osteoporosis and fractures, ask your health care provider if you should: Be screened for bone loss. Take a calcium or vitamin D supplement to lower your risk of fractures. Be given hormone replacement therapy (HRT) to treat symptoms of menopause. Follow these instructions at home: Alcohol use Do not drink alcohol if: Your health care provider tells you not to drink. You are pregnant, may be pregnant, or are planning to become pregnant. If you drink alcohol: Limit how much you have to: 0-1 drink a day. Know how much alcohol is in your drink. In the [place] one drink equals one 12 oz bottle of beer (355 mL), one 5 oz glass of wine (148 mL), or one 1 oz glass of hard liquor (44 mL). Lifestyle Do not use any products that contain nicotine or tobacco. These products include cigarettes, chewing tobacco, and vaping devices, such as e-cigarettes. If you need help quitting, ask your health care provider. Do not use street drugs. Do not share needles. Ask your health care provider for help if you need support or information about quitting drugs. General instructions Schedule regular health, dental, and eye exams. Stay current with your vaccines. Tell your health care provider if: You often feel depressed. You have ever been abused or do not feel safe at home. Summary Adopting a healthy lifestyle and getting preventive care are  important in promoting health and wellness. Follow your health care provider's instructions about healthy diet, exercising, and getting tested or screened for diseases. Follow your health care provider's instructions on monitoring your cholesterol and blood pressure. This information is not intended to replace advice given to you by your health care provider. Make sure you discuss any questions you have with your health care provider. Document Revised: 07/25/2020 Document Reviewed: 07/25/2020 Elsevier Patient Education  [place].

## 2021-11-29 NOTE — Progress Notes (Signed)
Subjective:    Patient ID: Misty [name], female    DOB: 1960/08/26, 61 y.o.   MRN: [id number]      HPI Misty [name] is here for a Physical exam.   Stop meloxicam - d/c'd-concern CKD -she states she thought she was only supposed to stop it temporarily and has been taking it.  Still dealing with right hip- upper lateral leg pain and chronic back pain.   She sees ortho and pain management.     Medications and allergies reviewed with patient and updated if appropriate.  Current Outpatient Medications on File Prior to Visit  Medication Sig Dispense Refill   albuterol (PROAIR HFA) 108 (90 Base) MCG/ACT inhaler Inhale 1-2 puffs into the lungs every 6 (six) hours as needed for wheezing or shortness of breath. 1 each 5   amLODipine (NORVASC) 2.5 MG tablet Take 1 tablet (2.5 mg total) by mouth daily. 90 tablet 1   aspirin EC 81 MG tablet Take 81 mg by mouth daily. Swallow whole.     atorvastatin (LIPITOR) 20 MG tablet Take 1 tablet (20 mg total) by mouth daily. Annual appt due in Oct must see provider for future refills 90 tablet 0   Cholecalciferol (VITAMIN D3) 50 MCG (2000 UT) capsule Take 1 capsule (2,000 Units total) by mouth daily. 90 capsule 3   cyanocobalamin (,VITAMIN B-12,) 1000 MCG/ML injection Inject 1,000 mcg into the muscle every 30 (thirty) days.     diclofenac Sodium (VOLTAREN) 1 % GEL APPLY 2 GRAMS TOPICALLY 4 TIMES DAILY 100 g 0   docusate sodium (COLACE) 100 MG capsule Take 1 capsule (100 mg total) by mouth 2 (two) times daily as needed for mild constipation. (Patient taking differently: Take 200 mg by mouth daily.) 60 capsule 5   gabapentin (NEURONTIN) 300 MG capsule Take 3 capsules (900 mg total) by mouth 3 (three) times daily. (Patient taking differently: Take 300 mg by mouth 3 (three) times daily. '600mg'$  AM / '600mg'$  afternoon / '900mg'$  HS) 810 capsule 2   lisinopril (ZESTRIL) 20 MG tablet Take 1 tablet (20 mg total) by mouth daily. 90 tablet 0   naloxone (NARCAN) nasal spray  4 mg/0.1 mL Place 1 spray into the nose as needed (opioid overdose).     ondansetron (ZOFRAN) 4 MG tablet TAKE 1 TABLET BY MOUTH EVERY 6 HOURS AS NEEDED FOR NAUSEA 20 tablet 0   oxyCODONE-acetaminophen (PERCOCET) 10-325 MG tablet Take 1 tablet by mouth every 6 (six) hours as needed for pain.     Semaglutide, 1 MG/DOSE, 4 MG/3ML SOPN Inject 1 mg as directed once a week. 9 mL 2   senna (SENOKOT) 8.6 MG TABS tablet Take 1 tablet (8.6 mg total) by mouth 2 (two) times daily. 120 tablet 0   Syringe/Needle, Disp, (SYRINGE 3CC/25GX1") 25G X 1" 3 ML MISC Use for monthly B12 injections 3 each 3   XTAMPZA ER 18 MG C12A Take 18 mg by mouth every 12 (twelve) hours.     No current facility-administered medications on file prior to visit.    Review of Systems  Constitutional:  Negative for fever.  Eyes:  Negative for visual disturbance.  Respiratory:  Negative for cough, shortness of breath and wheezing.   Cardiovascular:  Negative for chest pain, palpitations and leg swelling.  Gastrointestinal:  Negative for abdominal pain, blood in stool, constipation, diarrhea and nausea.       No gerd  Genitourinary:  Negative for dysuria.  Musculoskeletal:  Positive for arthralgias and back  pain.  Skin:  Negative for rash.  Neurological:  Negative for light-headedness and headaches.  Psychiatric/Behavioral:  Negative for dysphoric mood. The patient is not nervous/anxious.        Objective:   Vitals:   11/30/21 1013  BP: (!) 140/70  Pulse: 80  Temp: 98.2 F (36.8 C)  SpO2: 99%   Filed Weights   11/30/21 1013  Weight: 239 lb (108.4 kg)   Body mass index is 39.17 kg/m.  BP Readings from Last 3 Encounters:  11/30/21 (!) 140/70  10/16/21 130/80  10/11/21 116/77    Wt Readings from Last 3 Encounters:  11/30/21 239 lb (108.4 kg)  10/16/21 245 lb (111.1 kg)  10/11/21 242 lb (109.8 kg)       Physical Exam Constitutional: She appears well-developed and well-nourished. No distress.  HENT:   Head: Normocephalic and atraumatic.  Right Ear: External ear normal. Normal ear canal and TM Left Ear: External ear normal.  Normal ear canal and TM Mouth/Throat: Oropharynx is clear and moist.  Eyes: Conjunctivae normal.  Neck: Neck supple. No tracheal deviation present. No thyromegaly present.  No carotid bruit  Cardiovascular: Normal rate, regular rhythm and normal heart sounds.   No murmur heard.  No edema. Pulmonary/Chest: Effort normal and breath sounds normal. No respiratory distress. She has no wheezes. She has no rales.  Breast: deferred   Abdominal: Soft. She exhibits no distension.  Ventral hernia.  There is no tenderness.  Lymphadenopathy: She has no cervical adenopathy.  Skin: Skin is warm and dry. She is not diaphoretic.  Psychiatric: She has a normal mood and affect. Her behavior is normal.     Lab Results  Component Value Date   WBC 5.3 10/11/2021   HGB 11.2 (L) 10/11/2021   HCT 36.7 10/11/2021   PLT 203 10/11/2021   GLUCOSE 80 10/11/2021   CHOL 178 05/30/2021   TRIG 85.0 05/30/2021   HDL 89.90 05/30/2021   LDLCALC 71 05/30/2021   ALT 17 10/10/2021   AST 25 10/10/2021   NA 136 10/11/2021   K 4.8 10/11/2021   CL 104 10/11/2021   CREATININE 2.16 (H) 10/11/2021   BUN 36 (H) 10/11/2021   CO2 23 10/11/2021   TSH 0.32 (L) 09/01/2020   INR 1.0 01/19/2021   HGBA1C 6.1 05/30/2021         Assessment & Plan:   Physical exam: Screening blood work  ordered Exercise  regular Weight  working on weight loss Substance abuse  none   Reviewed recommended immunizations.flu vaccine today   Health Maintenance  Topic Date Due   COLONOSCOPY (Pts 45-92yr Insurance coverage will need to be confirmed)  09/09/2019   INFLUENZA VACCINE  10/17/2021   COVID-19 Vaccine (3 - Pfizer risk series) 12/16/2021 (Originally 09/26/2020)   Zoster Vaccines- Shingrix (1 of 2) 03/01/2022 (Originally 04/10/1980)   TETANUS/TDAP  12/01/2022 (Originally 04/10/1980)   MAMMOGRAM   10/27/2023   Hepatitis C Screening  Completed   HIV Screening  Completed   HPV VACCINES  Aged Out          See Problem List for Assessment and Plan of chronic medical problems.

## 2021-11-30 ENCOUNTER — Ambulatory Visit (INDEPENDENT_AMBULATORY_CARE_PROVIDER_SITE_OTHER): Payer: Medicare Other | Admitting: Internal Medicine

## 2021-11-30 VITALS — BP 140/70 | HR 80 | Temp 98.2°F | Ht 65.5 in | Wt 239.0 lb

## 2021-11-30 DIAGNOSIS — D509 Iron deficiency anemia, unspecified: Secondary | ICD-10-CM

## 2021-11-30 DIAGNOSIS — M62830 Muscle spasm of back: Secondary | ICD-10-CM | POA: Diagnosis not present

## 2021-11-30 DIAGNOSIS — Z0000 Encounter for general adult medical examination without abnormal findings: Secondary | ICD-10-CM | POA: Diagnosis not present

## 2021-11-30 DIAGNOSIS — E7849 Other hyperlipidemia: Secondary | ICD-10-CM

## 2021-11-30 DIAGNOSIS — F329 Major depressive disorder, single episode, unspecified: Secondary | ICD-10-CM

## 2021-11-30 DIAGNOSIS — N1832 Chronic kidney disease, stage 3b: Secondary | ICD-10-CM

## 2021-11-30 DIAGNOSIS — M48062 Spinal stenosis, lumbar region with neurogenic claudication: Secondary | ICD-10-CM

## 2021-11-30 DIAGNOSIS — Z23 Encounter for immunization: Secondary | ICD-10-CM

## 2021-11-30 DIAGNOSIS — F1010 Alcohol abuse, uncomplicated: Secondary | ICD-10-CM

## 2021-11-30 DIAGNOSIS — E538 Deficiency of other specified B group vitamins: Secondary | ICD-10-CM

## 2021-11-30 DIAGNOSIS — I10 Essential (primary) hypertension: Secondary | ICD-10-CM | POA: Diagnosis not present

## 2021-11-30 DIAGNOSIS — E559 Vitamin D deficiency, unspecified: Secondary | ICD-10-CM | POA: Diagnosis not present

## 2021-11-30 DIAGNOSIS — R7303 Prediabetes: Secondary | ICD-10-CM | POA: Diagnosis not present

## 2021-11-30 LAB — IBC PANEL
Iron: 65 ug/dL (ref 42–145)
Saturation Ratios: 19.6 % — ABNORMAL LOW (ref 20.0–50.0)
TIBC: 331.8 ug/dL (ref 250.0–450.0)
Transferrin: 237 mg/dL (ref 212.0–360.0)

## 2021-11-30 LAB — COMPREHENSIVE METABOLIC PANEL
ALT: 13 U/L (ref 0–35)
AST: 16 U/L (ref 0–37)
Albumin: 3.9 g/dL (ref 3.5–5.2)
Alkaline Phosphatase: 123 U/L — ABNORMAL HIGH (ref 39–117)
BUN: 23 mg/dL (ref 6–23)
CO2: 29 mEq/L (ref 19–32)
Calcium: 10 mg/dL (ref 8.4–10.5)
Chloride: 103 mEq/L (ref 96–112)
Creatinine, Ser: 1.09 mg/dL (ref 0.40–1.20)
GFR: 55.17 mL/min — ABNORMAL LOW (ref >60.00)
Glucose, Bld: 87 mg/dL (ref 70–99)
Potassium: 4.4 mEq/L (ref 3.5–5.1)
Sodium: 139 mEq/L (ref 135–145)
Total Bilirubin: 0.5 mg/dL (ref 0.2–1.2)
Total Protein: 7.4 g/dL (ref 6.0–8.3)

## 2021-11-30 LAB — FERRITIN: Ferritin: 19.7 ng/mL (ref 10.0–291.0)

## 2021-11-30 LAB — CBC WITH DIFFERENTIAL/PLATELET
Basophils Absolute: 0 10*3/uL (ref 0.0–0.1)
Basophils Relative: 0.6 % (ref 0.0–3.0)
Eosinophils Absolute: 0.2 10*3/uL (ref 0.0–0.7)
Eosinophils Relative: 2.3 % (ref 0.0–5.0)
HCT: 37.1 % (ref 36.0–46.0)
Hemoglobin: 11.8 g/dL — ABNORMAL LOW (ref 12.0–15.0)
Lymphocytes Relative: 27.1 % (ref 12.0–46.0)
Lymphs Abs: 1.9 10*3/uL (ref 0.7–4.0)
MCHC: 31.8 g/dL (ref 30.0–36.0)
MCV: 90.2 fl (ref 78.0–100.0)
Monocytes Absolute: 0.6 10*3/uL (ref 0.1–1.0)
Monocytes Relative: 8 % (ref 3.0–12.0)
Neutro Abs: 4.4 10*3/uL (ref 1.4–7.7)
Neutrophils Relative %: 62 % (ref 43.0–77.0)
Platelets: 215 10*3/uL (ref 150.0–400.0)
RBC: 4.12 Mil/uL (ref 3.87–5.11)
RDW: 16 % — ABNORMAL HIGH (ref 11.5–15.5)
WBC: 7 10*3/uL (ref 4.0–10.5)

## 2021-11-30 LAB — LIPID PANEL
Cholesterol: 176 mg/dL (ref 0–200)
HDL: 79.6 mg/dL (ref >39.00)
LDL Cholesterol: 81 mg/dL (ref 0–99)
NonHDL: 96.53
Total CHOL/HDL Ratio: 2
Triglycerides: 79 mg/dL (ref 0.0–149.0)
VLDL: 15.8 mg/dL (ref 0.0–40.0)

## 2021-11-30 LAB — HEMOGLOBIN A1C: Hgb A1c MFr Bld: 5.5 % (ref 4.6–6.5)

## 2021-11-30 LAB — VITAMIN D 25 HYDROXY (VIT D DEFICIENCY, FRACTURES): VITD: 44.09 ng/mL (ref 30.00–100.00)

## 2021-11-30 MED ORDER — FERROUS GLUCONATE 324 (38 FE) MG PO TABS: 324.0000 mg | ORAL_TABLET | Freq: Every day | ORAL | 3 refills | Status: AC

## 2021-11-30 MED ORDER — CYANOCOBALAMIN 1000 MCG/ML IJ SOLN
1000.0000 ug | Freq: Once | INTRAMUSCULAR | Status: AC
  Administered 2021-11-30: 1000 ug via INTRAMUSCULAR

## 2021-11-30 NOTE — Assessment & Plan Note (Signed)
Only drinking once in a while - social

## 2021-11-30 NOTE — Assessment & Plan Note (Signed)
Chronic Following with pain management On Xtampza ER 18 mg every 12 hours, Percocet 10-325 mg every 6 hours as needed Continue gabapentin 900 mg 3 times daily and Flexeril 5 mg at bedtime

## 2021-11-30 NOTE — Assessment & Plan Note (Signed)
Chronic Taking iron Check CBC, iron panel

## 2021-11-30 NOTE — Assessment & Plan Note (Addendum)
Chronic Likely secondary to gastric bypass Doing B12 injections monthly-B12 injection today

## 2021-11-30 NOTE — Assessment & Plan Note (Signed)
Chronic Taking vitamin D daily Check vitamin D level  

## 2021-11-30 NOTE — Assessment & Plan Note (Signed)
Chronic Check a1c Low sugar / carb diet Stressed regular exercise  

## 2021-11-30 NOTE — Assessment & Plan Note (Signed)
Decreased GFR Advised to stop all NSAIDs-and was advised to start previously, but thought that was only short-term CMP, CBC today

## 2021-11-30 NOTE — Assessment & Plan Note (Signed)
Chronic Intermittent Continue gabapentin 900 mg 3 times daily and Flexeril 5 mg at bedtime

## 2021-11-30 NOTE — Assessment & Plan Note (Signed)
Chronic Blood pressure well controlled CMP Continue lisinopril 20 mg daily 

## 2021-11-30 NOTE — Assessment & Plan Note (Signed)
Chronic °Regular exercise and healthy diet encouraged °Check lipid panel  °Continue atorvastatin 20 mg daily °

## 2021-12-04 DIAGNOSIS — R2689 Other abnormalities of gait and mobility: Secondary | ICD-10-CM | POA: Diagnosis not present

## 2021-12-04 DIAGNOSIS — M5459 Other low back pain: Secondary | ICD-10-CM | POA: Diagnosis not present

## 2021-12-12 DIAGNOSIS — M5459 Other low back pain: Secondary | ICD-10-CM | POA: Diagnosis not present

## 2021-12-12 DIAGNOSIS — R2689 Other abnormalities of gait and mobility: Secondary | ICD-10-CM | POA: Diagnosis not present

## 2021-12-18 DIAGNOSIS — M545 Low back pain, unspecified: Secondary | ICD-10-CM | POA: Diagnosis not present

## 2021-12-18 DIAGNOSIS — M5459 Other low back pain: Secondary | ICD-10-CM | POA: Diagnosis not present

## 2021-12-18 DIAGNOSIS — M25551 Pain in right hip: Secondary | ICD-10-CM | POA: Diagnosis not present

## 2021-12-18 DIAGNOSIS — R2689 Other abnormalities of gait and mobility: Secondary | ICD-10-CM | POA: Diagnosis not present

## 2021-12-18 DIAGNOSIS — G894 Chronic pain syndrome: Secondary | ICD-10-CM | POA: Diagnosis not present

## 2021-12-18 DIAGNOSIS — G8929 Other chronic pain: Secondary | ICD-10-CM | POA: Diagnosis not present

## 2021-12-18 DIAGNOSIS — M25561 Pain in right knee: Secondary | ICD-10-CM | POA: Diagnosis not present

## 2021-12-19 DIAGNOSIS — T8484XD Pain due to internal orthopedic prosthetic devices, implants and grafts, subsequent encounter: Secondary | ICD-10-CM | POA: Diagnosis not present

## 2021-12-19 DIAGNOSIS — M1711 Unilateral primary osteoarthritis, right knee: Secondary | ICD-10-CM | POA: Diagnosis not present

## 2021-12-19 DIAGNOSIS — M7061 Trochanteric bursitis, right hip: Secondary | ICD-10-CM | POA: Diagnosis not present

## 2021-12-22 DIAGNOSIS — T8484XD Pain due to internal orthopedic prosthetic devices, implants and grafts, subsequent encounter: Secondary | ICD-10-CM | POA: Diagnosis not present

## 2021-12-25 DIAGNOSIS — R2689 Other abnormalities of gait and mobility: Secondary | ICD-10-CM | POA: Diagnosis not present

## 2021-12-25 DIAGNOSIS — M5459 Other low back pain: Secondary | ICD-10-CM | POA: Diagnosis not present

## 2022-01-01 DIAGNOSIS — R2689 Other abnormalities of gait and mobility: Secondary | ICD-10-CM | POA: Diagnosis not present

## 2022-01-01 DIAGNOSIS — M5459 Other low back pain: Secondary | ICD-10-CM | POA: Diagnosis not present

## 2022-01-04 ENCOUNTER — Other Ambulatory Visit: Payer: Self-pay | Admitting: Internal Medicine

## 2022-01-08 DIAGNOSIS — R2689 Other abnormalities of gait and mobility: Secondary | ICD-10-CM | POA: Diagnosis not present

## 2022-01-08 DIAGNOSIS — M5459 Other low back pain: Secondary | ICD-10-CM | POA: Diagnosis not present

## 2022-01-15 DIAGNOSIS — H35033 Hypertensive retinopathy, bilateral: Secondary | ICD-10-CM | POA: Diagnosis not present

## 2022-01-15 DIAGNOSIS — H35363 Drusen (degenerative) of macula, bilateral: Secondary | ICD-10-CM | POA: Diagnosis not present

## 2022-01-15 DIAGNOSIS — H25813 Combined forms of age-related cataract, bilateral: Secondary | ICD-10-CM | POA: Diagnosis not present

## 2022-01-15 DIAGNOSIS — H524 Presbyopia: Secondary | ICD-10-CM | POA: Diagnosis not present

## 2022-01-15 DIAGNOSIS — H04123 Dry eye syndrome of bilateral lacrimal glands: Secondary | ICD-10-CM | POA: Diagnosis not present

## 2022-01-15 LAB — HM DIABETES EYE EXAM

## 2022-01-16 ENCOUNTER — Ambulatory Visit: Payer: Medicare Other | Admitting: Emergency Medicine

## 2022-01-17 NOTE — Progress Notes (Unsigned)
Subjective:    Patient ID: Misty [name], female    DOB: 07-Nov-1960, 61 y.o.   MRN: [id number]      HPI Misty [name] is here for No chief complaint on file.    ? UTI:  Her symptoms started  *** days ago.  She states dysuria, urinary frequency, urinary urgency, hematuria, abdominal pain, back pain, nausea, fever.  She denies other symptoms.      Medications and allergies reviewed with patient and updated if appropriate.  Current Outpatient Medications on File Prior to Visit  Medication Sig Dispense Refill   albuterol (PROAIR HFA) 108 (90 Base) MCG/ACT inhaler Inhale 1-2 puffs into the lungs every 6 (six) hours as needed for wheezing or shortness of breath. 1 each 5   amLODipine (NORVASC) 2.5 MG tablet Take 1 tablet (2.5 mg total) by mouth daily. 90 tablet 1   aspirin EC 81 MG tablet Take 81 mg by mouth daily. Swallow whole.     atorvastatin (LIPITOR) 20 MG tablet Take 1 tablet (20 mg total) by mouth daily. Annual appt due in Oct must see provider for future refills 90 tablet 0   Cholecalciferol (VITAMIN D3) 50 MCG (2000 UT) TABS TAKE 1 TABLET BY MOUTH EVERY DAY 90 tablet 3   cyanocobalamin (,VITAMIN B-12,) 1000 MCG/ML injection Inject 1,000 mcg into the muscle every 30 (thirty) days.     diclofenac Sodium (VOLTAREN) 1 % GEL APPLY 2 GRAMS TOPICALLY 4 TIMES DAILY 100 g 0   docusate sodium (COLACE) 100 MG capsule Take 1 capsule (100 mg total) by mouth 2 (two) times daily as needed for mild constipation. (Patient taking differently: Take 200 mg by mouth daily.) 60 capsule 5   ferrous gluconate (FERGON) 324 MG tablet Take 1 tablet (324 mg total) by mouth daily.  3   gabapentin (NEURONTIN) 300 MG capsule Take 3 capsules (900 mg total) by mouth 3 (three) times daily. (Patient taking differently: Take 300 mg by mouth 3 (three) times daily. '600mg'$  AM / '600mg'$  afternoon / '900mg'$  HS) 810 capsule 2   lisinopril (ZESTRIL) 20 MG tablet Take 1 tablet (20 mg total) by mouth daily. 90 tablet 0    naloxone (NARCAN) nasal spray 4 mg/0.1 mL Place 1 spray into the nose as needed (opioid overdose).     ondansetron (ZOFRAN) 4 MG tablet TAKE 1 TABLET BY MOUTH EVERY 6 HOURS AS NEEDED FOR NAUSEA 20 tablet 0   oxyCODONE-acetaminophen (PERCOCET) 10-325 MG tablet Take 1 tablet by mouth every 6 (six) hours as needed for pain.     Semaglutide, 1 MG/DOSE, 4 MG/3ML SOPN Inject 1 mg as directed once a week. 9 mL 2   senna (SENOKOT) 8.6 MG TABS tablet Take 1 tablet (8.6 mg total) by mouth 2 (two) times daily. 120 tablet 0   Syringe/Needle, Disp, (SYRINGE 3CC/25GX1") 25G X 1" 3 ML MISC Use for monthly B12 injections 3 each 3   XTAMPZA ER 18 MG C12A Take 18 mg by mouth every 12 (twelve) hours.     No current facility-administered medications on file prior to visit.    Review of Systems     Objective:  There were no vitals filed for this visit. BP Readings from Last 3 Encounters:  11/30/21 (!) 140/70  10/16/21 130/80  10/11/21 116/77   Wt Readings from Last 3 Encounters:  11/30/21 239 lb (108.4 kg)  10/16/21 245 lb (111.1 kg)  10/11/21 242 lb (109.8 kg)   There is no height or weight on  file to calculate BMI.    Physical Exam         Assessment & Plan:    See Problem List for Assessment and Plan of chronic medical problems.

## 2022-01-18 ENCOUNTER — Ambulatory Visit (INDEPENDENT_AMBULATORY_CARE_PROVIDER_SITE_OTHER): Payer: Medicare Other | Admitting: Internal Medicine

## 2022-01-18 ENCOUNTER — Encounter: Payer: Self-pay | Admitting: Internal Medicine

## 2022-01-18 VITALS — BP 128/76 | HR 92 | Temp 98.0°F | Ht 65.5 in | Wt 234.0 lb

## 2022-01-18 DIAGNOSIS — B3731 Acute candidiasis of vulva and vagina: Secondary | ICD-10-CM

## 2022-01-18 DIAGNOSIS — R300 Dysuria: Secondary | ICD-10-CM

## 2022-01-18 DIAGNOSIS — E538 Deficiency of other specified B group vitamins: Secondary | ICD-10-CM

## 2022-01-18 DIAGNOSIS — M48062 Spinal stenosis, lumbar region with neurogenic claudication: Secondary | ICD-10-CM | POA: Diagnosis not present

## 2022-01-18 DIAGNOSIS — I10 Essential (primary) hypertension: Secondary | ICD-10-CM

## 2022-01-18 LAB — POC URINALSYSI DIPSTICK (AUTOMATED)
Bilirubin, UA: NEGATIVE
Blood, UA: NEGATIVE
Glucose, UA: NEGATIVE
Ketones, UA: NEGATIVE
Leukocytes, UA: NEGATIVE
Nitrite, UA: NEGATIVE
Protein, UA: NEGATIVE
Spec Grav, UA: 1.025 (ref 1.010–1.025)
Urobilinogen, UA: 0.2 E.U./dL
pH, UA: 6 (ref 5.0–8.0)

## 2022-01-18 MED ORDER — TIZANIDINE HCL 2 MG PO TABS: 2.0000 mg | ORAL_TABLET | Freq: Two times a day (BID) | ORAL | 3 refills | Status: DC | PRN

## 2022-01-18 MED ORDER — FLUCONAZOLE 150 MG PO TABS: 150.0000 mg | ORAL_TABLET | Freq: Once | ORAL | 0 refills | Status: AC

## 2022-01-18 MED ORDER — CYANOCOBALAMIN 1000 MCG/ML IJ SOLN
1000.0000 ug | Freq: Once | INTRAMUSCULAR | Status: AC
  Administered 2022-01-18: 1000 ug via INTRAMUSCULAR

## 2022-01-18 NOTE — Assessment & Plan Note (Signed)
Chronic Deficiency likely secondary to gastric bypass Continue monthly B12 injections B12 injection today

## 2022-01-18 NOTE — Assessment & Plan Note (Signed)
Chronic lower back pain Following with pain management and they are managing her medications-does not take regular basis only takes as needed Feels that a muscle relaxer would help more than the pain medication-we will prescribe presenting to milligrams twice daily as needed-can titrate if needed Continue TENS unit, pain patches, and pain medication per pain management

## 2022-01-18 NOTE — Patient Instructions (Addendum)
       Medications changes include :   fluconazole for yeast infection.  Tizanidine for back muscle spasms.      Return if symptoms worsen or fail to improve.

## 2022-01-18 NOTE — Assessment & Plan Note (Signed)
Acute Symptoms c/w yeast infection - urine dip neg for infection Symptoms improved with monistat x 2 nights but not resolved Diflucan 150 mg po x 1

## 2022-01-18 NOTE — Addendum Note (Signed)
Addended by: Marcina Millard on: 01/18/2022 05:02 PM   Modules accepted: Orders

## 2022-01-18 NOTE — Assessment & Plan Note (Signed)
Chronic BP well controlled Continue lisinopril 20 mg daily   

## 2022-01-22 DIAGNOSIS — G8929 Other chronic pain: Secondary | ICD-10-CM | POA: Diagnosis not present

## 2022-01-22 DIAGNOSIS — M25561 Pain in right knee: Secondary | ICD-10-CM | POA: Diagnosis not present

## 2022-01-22 DIAGNOSIS — M545 Low back pain, unspecified: Secondary | ICD-10-CM | POA: Diagnosis not present

## 2022-01-22 DIAGNOSIS — G894 Chronic pain syndrome: Secondary | ICD-10-CM | POA: Diagnosis not present

## 2022-01-22 DIAGNOSIS — M25551 Pain in right hip: Secondary | ICD-10-CM | POA: Diagnosis not present

## 2022-01-23 DIAGNOSIS — R2689 Other abnormalities of gait and mobility: Secondary | ICD-10-CM | POA: Diagnosis not present

## 2022-01-23 DIAGNOSIS — M5459 Other low back pain: Secondary | ICD-10-CM | POA: Diagnosis not present

## 2022-01-24 ENCOUNTER — Ambulatory Visit (INDEPENDENT_AMBULATORY_CARE_PROVIDER_SITE_OTHER): Payer: Medicare Other | Admitting: Podiatry

## 2022-01-24 ENCOUNTER — Encounter: Payer: Self-pay | Admitting: Podiatry

## 2022-01-24 DIAGNOSIS — M76899 Other specified enthesopathies of unspecified lower limb, excluding foot: Secondary | ICD-10-CM | POA: Insufficient documentation

## 2022-01-24 DIAGNOSIS — Q828 Other specified congenital malformations of skin: Secondary | ICD-10-CM

## 2022-01-24 DIAGNOSIS — M76891 Other specified enthesopathies of right lower limb, excluding foot: Secondary | ICD-10-CM

## 2022-01-24 NOTE — Progress Notes (Signed)
This patient presents to the office with chief complaint of painful second toe left foot. She says this toe is very painful and the callus is present on the tip of her second toenail left foot is also painful.  She presents to the office for preventative foot care services for nail care.  Patient also has painful callus under the ball of her right foot.   General Appearance  Alert, conversant and in no acute stress.  Vascular  Dorsalis pedis and posterior tibial  pulses are palpable  bilaterally.  Capillary return is within normal limits  bilaterally. Temperature is within normal limits  bilaterally.  Neurologic  Senn-Weinstein monofilament wire test within normal limits  bilaterally. Muscle power within normal limits bilaterally.  Nails Thick disfigured discolored nails with subungual debris  thick hallux nails.bilaterally. No evidence of bacterial infection or drainage bilaterally.  Orthopedic  No limitations of motion  feet .  No crepitus or effusions noted.  Capsulitis sub 3 right foot. HAV  B/L.  Hammer toes 2-5  B/L especially  second toes both feet.  Skin  normotropic skin noted bilaterally.  No signs of infections or ulcers noted.  Porokeratosis right forefoot.    Porokeratosis right foot.  Hammer toes  B/L.  Debride porokeratosis with # 15 blade. With # 15 blade.  Padding dispensed. Patient had surgery orthoses padding and pain persists.  She would like to be seen sooner.   Gardiner Barefoot DPM

## 2022-02-01 ENCOUNTER — Encounter: Payer: Self-pay | Admitting: Internal Medicine

## 2022-02-01 DIAGNOSIS — M5459 Other low back pain: Secondary | ICD-10-CM | POA: Diagnosis not present

## 2022-02-01 DIAGNOSIS — R2689 Other abnormalities of gait and mobility: Secondary | ICD-10-CM | POA: Diagnosis not present

## 2022-02-01 NOTE — Progress Notes (Signed)
Outside notes received. Information abstracted. Notes sent to scan.  

## 2022-02-07 ENCOUNTER — Other Ambulatory Visit: Payer: Self-pay | Admitting: Family Medicine

## 2022-02-07 ENCOUNTER — Other Ambulatory Visit: Payer: Self-pay | Admitting: Internal Medicine

## 2022-02-07 DIAGNOSIS — E559 Vitamin D deficiency, unspecified: Secondary | ICD-10-CM

## 2022-02-12 DIAGNOSIS — R2689 Other abnormalities of gait and mobility: Secondary | ICD-10-CM | POA: Diagnosis not present

## 2022-02-12 DIAGNOSIS — Z96641 Presence of right artificial hip joint: Secondary | ICD-10-CM | POA: Diagnosis not present

## 2022-02-12 DIAGNOSIS — M5459 Other low back pain: Secondary | ICD-10-CM | POA: Diagnosis not present

## 2022-02-14 DIAGNOSIS — M5416 Radiculopathy, lumbar region: Secondary | ICD-10-CM | POA: Diagnosis not present

## 2022-02-14 DIAGNOSIS — G8929 Other chronic pain: Secondary | ICD-10-CM | POA: Diagnosis not present

## 2022-02-15 DIAGNOSIS — R2689 Other abnormalities of gait and mobility: Secondary | ICD-10-CM | POA: Diagnosis not present

## 2022-02-15 DIAGNOSIS — M5459 Other low back pain: Secondary | ICD-10-CM | POA: Diagnosis not present

## 2022-02-19 ENCOUNTER — Telehealth: Payer: Medicare Other

## 2022-02-22 DIAGNOSIS — M5459 Other low back pain: Secondary | ICD-10-CM | POA: Diagnosis not present

## 2022-02-22 DIAGNOSIS — G8929 Other chronic pain: Secondary | ICD-10-CM | POA: Diagnosis not present

## 2022-02-22 DIAGNOSIS — M25551 Pain in right hip: Secondary | ICD-10-CM | POA: Diagnosis not present

## 2022-02-22 DIAGNOSIS — M25561 Pain in right knee: Secondary | ICD-10-CM | POA: Diagnosis not present

## 2022-02-22 DIAGNOSIS — G894 Chronic pain syndrome: Secondary | ICD-10-CM | POA: Diagnosis not present

## 2022-02-22 DIAGNOSIS — R2689 Other abnormalities of gait and mobility: Secondary | ICD-10-CM | POA: Diagnosis not present

## 2022-02-22 DIAGNOSIS — M545 Low back pain, unspecified: Secondary | ICD-10-CM | POA: Diagnosis not present

## 2022-02-24 ENCOUNTER — Other Ambulatory Visit: Payer: Self-pay | Admitting: Internal Medicine

## 2022-02-26 DIAGNOSIS — M5116 Intervertebral disc disorders with radiculopathy, lumbar region: Secondary | ICD-10-CM | POA: Diagnosis not present

## 2022-02-26 DIAGNOSIS — M5136 Other intervertebral disc degeneration, lumbar region: Secondary | ICD-10-CM | POA: Diagnosis not present

## 2022-02-26 DIAGNOSIS — M48061 Spinal stenosis, lumbar region without neurogenic claudication: Secondary | ICD-10-CM | POA: Diagnosis not present

## 2022-02-26 DIAGNOSIS — M4727 Other spondylosis with radiculopathy, lumbosacral region: Secondary | ICD-10-CM | POA: Diagnosis not present

## 2022-02-26 DIAGNOSIS — M5416 Radiculopathy, lumbar region: Secondary | ICD-10-CM | POA: Diagnosis not present

## 2022-02-26 DIAGNOSIS — M4327 Fusion of spine, lumbosacral region: Secondary | ICD-10-CM | POA: Diagnosis not present

## 2022-02-27 DIAGNOSIS — R2689 Other abnormalities of gait and mobility: Secondary | ICD-10-CM | POA: Diagnosis not present

## 2022-02-27 DIAGNOSIS — M5459 Other low back pain: Secondary | ICD-10-CM | POA: Diagnosis not present

## 2022-03-01 DIAGNOSIS — M48062 Spinal stenosis, lumbar region with neurogenic claudication: Secondary | ICD-10-CM | POA: Diagnosis not present

## 2022-03-01 DIAGNOSIS — M5416 Radiculopathy, lumbar region: Secondary | ICD-10-CM | POA: Diagnosis not present

## 2022-03-01 DIAGNOSIS — M5459 Other low back pain: Secondary | ICD-10-CM | POA: Diagnosis not present

## 2022-03-01 DIAGNOSIS — R2689 Other abnormalities of gait and mobility: Secondary | ICD-10-CM | POA: Diagnosis not present

## 2022-03-16 ENCOUNTER — Other Ambulatory Visit: Payer: Self-pay | Admitting: Internal Medicine

## 2022-03-21 DIAGNOSIS — G894 Chronic pain syndrome: Secondary | ICD-10-CM | POA: Diagnosis not present

## 2022-03-21 DIAGNOSIS — M25551 Pain in right hip: Secondary | ICD-10-CM | POA: Diagnosis not present

## 2022-03-21 DIAGNOSIS — G8929 Other chronic pain: Secondary | ICD-10-CM | POA: Diagnosis not present

## 2022-03-21 DIAGNOSIS — M545 Low back pain, unspecified: Secondary | ICD-10-CM | POA: Diagnosis not present

## 2022-03-21 DIAGNOSIS — M25561 Pain in right knee: Secondary | ICD-10-CM | POA: Diagnosis not present

## 2022-03-28 DIAGNOSIS — R2689 Other abnormalities of gait and mobility: Secondary | ICD-10-CM | POA: Diagnosis not present

## 2022-03-28 DIAGNOSIS — M5459 Other low back pain: Secondary | ICD-10-CM | POA: Diagnosis not present

## 2022-04-06 ENCOUNTER — Ambulatory Visit: Payer: Medicare Other | Admitting: Podiatry

## 2022-04-09 ENCOUNTER — Ambulatory Visit (INDEPENDENT_AMBULATORY_CARE_PROVIDER_SITE_OTHER): Payer: 59 | Admitting: Podiatry

## 2022-04-09 ENCOUNTER — Encounter: Payer: Self-pay | Admitting: Podiatry

## 2022-04-09 ENCOUNTER — Ambulatory Visit: Payer: Medicare Other | Admitting: Podiatry

## 2022-04-09 DIAGNOSIS — M2042 Other hammer toe(s) (acquired), left foot: Secondary | ICD-10-CM | POA: Diagnosis not present

## 2022-04-09 DIAGNOSIS — M2041 Other hammer toe(s) (acquired), right foot: Secondary | ICD-10-CM | POA: Diagnosis not present

## 2022-04-09 DIAGNOSIS — Q828 Other specified congenital malformations of skin: Secondary | ICD-10-CM

## 2022-04-09 DIAGNOSIS — N1832 Chronic kidney disease, stage 3b: Secondary | ICD-10-CM | POA: Diagnosis not present

## 2022-04-09 DIAGNOSIS — M1711 Unilateral primary osteoarthritis, right knee: Secondary | ICD-10-CM | POA: Diagnosis not present

## 2022-04-09 NOTE — Progress Notes (Signed)
This patient presents to the office with chief complaint of painful second toe left foot. She says this toe is very painful and the callus is present on the tip of her second toenail left foot as well as second toe right foot.  She also has painful callus right forefoot. She presents to the office for preventative foot care services for nail care.  Patient also has painful callus under the ball of her right foot.   General Appearance  Alert, conversant and in no acute stress.  Vascular  Dorsalis pedis and posterior tibial  pulses are palpable  bilaterally.  Capillary return is within normal limits  bilaterally. Temperature is within normal limits  bilaterally.  Neurologic  Senn-Weinstein monofilament wire test within normal limits  bilaterally. Muscle power within normal limits bilaterally.  Nails Thick disfigured discolored nails with subungual debris  thick hallux nails  bilaterally. No evidence of bacterial infection or drainage bilaterally.  Orthopedic  No limitations of motion  feet .  No crepitus or effusions noted.  HAV  B/L.  Hammer toes 2-5  B/L especially  second toes both feet.  Fused IPJ right hallux.  Skin  normotropic skin noted bilaterally.  No signs of infections or ulcers noted.  Porokeratosis right forefoot.    Porokeratosis right foot.  Hammer toes  B/L.  Debride porokeratosis with # 15 blade. with # 15 blade. She requests an appointment with surgeon to discuss possible surgery.     Gardiner Barefoot DPM

## 2022-04-11 ENCOUNTER — Ambulatory Visit (INDEPENDENT_AMBULATORY_CARE_PROVIDER_SITE_OTHER): Payer: 59 | Admitting: Internal Medicine

## 2022-04-11 ENCOUNTER — Encounter: Payer: Self-pay | Admitting: Internal Medicine

## 2022-04-11 VITALS — BP 120/82 | HR 80 | Temp 98.4°F | Ht 65.5 in | Wt 225.0 lb

## 2022-04-11 DIAGNOSIS — Z01818 Encounter for other preprocedural examination: Secondary | ICD-10-CM

## 2022-04-11 DIAGNOSIS — N1832 Chronic kidney disease, stage 3b: Secondary | ICD-10-CM

## 2022-04-11 DIAGNOSIS — I10 Essential (primary) hypertension: Secondary | ICD-10-CM | POA: Diagnosis not present

## 2022-04-11 DIAGNOSIS — E7849 Other hyperlipidemia: Secondary | ICD-10-CM | POA: Diagnosis not present

## 2022-04-11 DIAGNOSIS — E0590 Thyrotoxicosis, unspecified without thyrotoxic crisis or storm: Secondary | ICD-10-CM

## 2022-04-11 DIAGNOSIS — E538 Deficiency of other specified B group vitamins: Secondary | ICD-10-CM

## 2022-04-11 DIAGNOSIS — R7303 Prediabetes: Secondary | ICD-10-CM

## 2022-04-11 LAB — COMPREHENSIVE METABOLIC PANEL
ALT: 14 U/L (ref 0–35)
AST: 19 U/L (ref 0–37)
Albumin: 4.1 g/dL (ref 3.5–5.2)
Alkaline Phosphatase: 111 U/L (ref 39–117)
BUN: 24 mg/dL — ABNORMAL HIGH (ref 6–23)
CO2: 29 mEq/L (ref 19–32)
Calcium: 9.7 mg/dL (ref 8.4–10.5)
Chloride: 102 mEq/L (ref 96–112)
Creatinine, Ser: 0.78 mg/dL (ref 0.40–1.20)
GFR: 82.22 mL/min (ref >60.00)
Glucose, Bld: 90 mg/dL (ref 70–99)
Potassium: 4.9 mEq/L (ref 3.5–5.1)
Sodium: 138 mEq/L (ref 135–145)
Total Bilirubin: 0.6 mg/dL (ref 0.2–1.2)
Total Protein: 7.3 g/dL (ref 6.0–8.3)

## 2022-04-11 LAB — LIPID PANEL
Cholesterol: 182 mg/dL (ref 0–200)
HDL: 84.1 mg/dL (ref >39.00)
LDL Cholesterol: 77 mg/dL (ref 0–99)
NonHDL: 98.2
Total CHOL/HDL Ratio: 2
Triglycerides: 105 mg/dL (ref 0.0–149.0)
VLDL: 21 mg/dL (ref 0.0–40.0)

## 2022-04-11 LAB — CBC WITH DIFFERENTIAL/PLATELET
Basophils Absolute: 0 10*3/uL (ref 0.0–0.1)
Basophils Relative: 0.3 % (ref 0.0–3.0)
Eosinophils Absolute: 0.1 10*3/uL (ref 0.0–0.7)
Eosinophils Relative: 1.7 % (ref 0.0–5.0)
HCT: 38.8 % (ref 36.0–46.0)
Hemoglobin: 12.4 g/dL (ref 12.0–15.0)
Lymphocytes Relative: 23.4 % (ref 12.0–46.0)
Lymphs Abs: 1.7 10*3/uL (ref 0.7–4.0)
MCHC: 31.8 g/dL (ref 30.0–36.0)
MCV: 92.6 fl (ref 78.0–100.0)
Monocytes Absolute: 0.5 10*3/uL (ref 0.1–1.0)
Monocytes Relative: 7.5 % (ref 3.0–12.0)
Neutro Abs: 4.9 10*3/uL (ref 1.4–7.7)
Neutrophils Relative %: 67.1 % (ref 43.0–77.0)
Platelets: 241 10*3/uL (ref 150.0–400.0)
RBC: 4.19 Mil/uL (ref 3.87–5.11)
RDW: 13.9 % (ref 11.5–15.5)
WBC: 7.4 10*3/uL (ref 4.0–10.5)

## 2022-04-11 LAB — HEMOGLOBIN A1C: Hgb A1c MFr Bld: 5.5 % (ref 4.6–6.5)

## 2022-04-11 LAB — TSH: TSH: 0.19 u[IU]/mL — ABNORMAL LOW (ref 0.35–5.50)

## 2022-04-11 LAB — MICROALBUMIN / CREATININE URINE RATIO
Creatinine,U: 101.3 mg/dL
Microalb Creat Ratio: 0.7 mg/g (ref 0.0–30.0)
Microalb, Ur: <0.7 mg/dL (ref 0.0–1.9)

## 2022-04-11 MED ORDER — CYANOCOBALAMIN 1000 MCG/ML IJ SOLN
1000.0000 ug | Freq: Once | INTRAMUSCULAR | Status: AC
  Administered 2022-04-11: 1000 ug via INTRAMUSCULAR

## 2022-04-11 NOTE — Patient Instructions (Addendum)
     B12 injection today    Blood work was ordered.   The lab is on the first floor.    Medications changes include :  stop amlodipine       Return in about 6 months (around 10/10/2022) for follow up.

## 2022-04-11 NOTE — Assessment & Plan Note (Signed)
Here at request of Dr Lyla Glassing for upcoming surgery of right total knee arthroplasty - TBD with spinal anesthesia Chronic medical problems stable Cbc, cmp, A1c Stop ASA 81 mg one week prior Stop ozempic at least one week prior to surgery Low risk for surgery

## 2022-04-11 NOTE — Assessment & Plan Note (Addendum)
Chronic Cmp, UA, Umicroalbumin BP well controlled

## 2022-04-11 NOTE — Assessment & Plan Note (Signed)
Chronic Secondary to gastric bypass Getting monthly B12 injections B12 injection today

## 2022-04-11 NOTE — Assessment & Plan Note (Signed)
Chronic Check lipid panel  Continue atorvastatin 20 mg daily Regular exercise and healthy diet encouraged  

## 2022-04-11 NOTE — Progress Notes (Signed)
Subjective:    Patient ID: Misty [name], female    DOB: 1960-11-29, 62 y.o.   MRN: [id number]      HPI Misty [name] is here for  Chief Complaint  Patient presents with   Medication Refill    Surgery clearance     Misty [name] is here for pre-operative clearance at the request of Dr Rod Can for right total knee arthroplasty scheduled for TBD.   Misty [name] denies any personal or family history of problems with anesthesia or bleeding/blood clot problems.    Misty [name] has no concerns and is taking all prescribed medication as prescribed.   Misty [name] is not exercising regularly.  With their daily activities they denies chest pain, palpitations, SOB and lightheadedness.      Stinging sensation in hands - related to low K -- she thinks she needs the potassium back.     Medications and allergies reviewed with patient and updated if appropriate.  Current Outpatient Medications on File Prior to Visit  Medication Sig Dispense Refill   albuterol (PROAIR HFA) 108 (90 Base) MCG/ACT inhaler Inhale 1-2 puffs into the lungs every 6 (six) hours as needed for wheezing or shortness of breath. 1 each 5   amLODipine (NORVASC) 2.5 MG tablet Take 1 tablet (2.5 mg total) by mouth daily. 90 tablet 1   aspirin EC 81 MG tablet Take 81 mg by mouth daily. Swallow whole.     atorvastatin (LIPITOR) 20 MG tablet Take 1 tablet (20 mg total) by mouth daily. Annual appt due in Oct must see provider for future refills 90 tablet 0   Cholecalciferol (VITAMIN D3) 50 MCG (2000 UT) TABS TAKE 1 TABLET BY MOUTH EVERY DAY 90 tablet 3   cyanocobalamin (,VITAMIN B-12,) 1000 MCG/ML injection Inject 1,000 mcg into the muscle every 30 (thirty) days.     diclofenac Sodium (VOLTAREN) 1 % GEL APPLY 2 GRAMS TOPICALLY 4 TIMES DAILY 100 g 0   docusate sodium (COLACE) 100 MG capsule Take 1 capsule (100 mg total) by mouth 2 (two) times daily as needed for mild constipation. (Patient taking differently: Take 200 mg by mouth daily.) 60  capsule 5   ferrous gluconate (FERGON) 324 MG tablet Take 1 tablet (324 mg total) by mouth daily.  3   gabapentin (NEURONTIN) 300 MG capsule TAKE 3 CAPSULES BY MOUTH THREE TIMES DAILY 270 capsule 0   lisinopril (ZESTRIL) 20 MG tablet Take 1 tablet (20 mg total) by mouth daily. 90 tablet 0   naloxone (NARCAN) nasal spray 4 mg/0.1 mL Place 1 spray into the nose as needed (opioid overdose).     ondansetron (ZOFRAN) 4 MG tablet TAKE 1 TABLET BY MOUTH EVERY 6 HOURS AS NEEDED FOR NAUSEA 20 tablet 0   oxyCODONE-acetaminophen (PERCOCET) 10-325 MG tablet Take 1 tablet by mouth every 6 (six) hours as needed for pain.     OZEMPIC, 1 MG/DOSE, 4 MG/3ML SOPN INJECT 1 MG  AS DIRECTED ONCE A WEEK 9 mL 0   senna (SENOKOT) 8.6 MG TABS tablet Take 1 tablet (8.6 mg total) by mouth 2 (two) times daily. 120 tablet 0   Syringe/Needle, Disp, (SYRINGE 3CC/25GX1") 25G X 1" 3 ML MISC Use for monthly B12 injections 3 each 3   tiZANidine (ZANAFLEX) 2 MG tablet Take 1 tablet (2 mg total) by mouth 2 (two) times daily as needed for muscle spasms. 60 tablet 3   XTAMPZA ER 18 MG C12A Take 18 mg by mouth every 12 (twelve) hours.  No current facility-administered medications on file prior to visit.    Review of Systems  Constitutional:  Negative for chills and fever.  Eyes:  Negative for visual disturbance.  Respiratory:  Negative for cough, shortness of breath and wheezing.   Cardiovascular:  Negative for chest pain, palpitations and leg swelling.  Gastrointestinal:  Negative for abdominal pain, blood in stool, constipation, diarrhea and nausea.       No gerd  Genitourinary:  Negative for dysuria and hematuria.  Musculoskeletal:  Positive for arthralgias.  Skin:  Negative for rash.  Neurological:  Negative for dizziness, light-headedness and headaches.       Objective:   Vitals:   04/11/22 1501  BP: 120/82  Pulse: 80  Temp: 98.4 F (36.9 C)  SpO2: 100%   BP Readings from Last 3 Encounters:  04/11/22 120/82   01/18/22 128/76  11/30/21 (!) 140/70   Wt Readings from Last 3 Encounters:  04/11/22 225 lb (102.1 kg)  01/18/22 234 lb (106.1 kg)  11/30/21 239 lb (108.4 kg)   Body mass index is 36.87 kg/m.    Physical Exam Constitutional:      General: She is not in acute distress.    Appearance: Normal appearance.  HENT:     Head: Normocephalic and atraumatic.  Eyes:     Conjunctiva/sclera: Conjunctivae normal.  Cardiovascular:     Rate and Rhythm: Normal rate and regular rhythm.     Heart sounds: Normal heart sounds. No murmur heard. Pulmonary:     Effort: Pulmonary effort is normal. No respiratory distress.     Breath sounds: Normal breath sounds. No wheezing.  Abdominal:     General: There is no distension.     Palpations: Abdomen is soft.     Tenderness: There is no abdominal tenderness.  Musculoskeletal:     Cervical back: Neck supple.     Right lower leg: No edema.     Left lower leg: No edema.  Lymphadenopathy:     Cervical: No cervical adenopathy.  Skin:    General: Skin is warm and dry.     Findings: No rash.  Neurological:     Mental Status: She is alert. Mental status is at baseline.  Psychiatric:        Mood and Affect: Mood normal.        Behavior: Behavior normal.            Assessment & Plan:    See Problem List for Assessment and Plan of chronic medical problems.

## 2022-04-11 NOTE — Assessment & Plan Note (Signed)
Chronic BP well controlled - she has not been taking her medication regularly because of low BP  at times Continue lisinopril 10 mg daily Stop amlodipine 2.5 mg daily cmp

## 2022-04-11 NOTE — Assessment & Plan Note (Signed)
Chronic Check a1c Low sugar / carb diet Stressed regular exercise   

## 2022-04-12 LAB — URINALYSIS, ROUTINE W REFLEX MICROSCOPIC
Bilirubin Urine: NEGATIVE
Hgb urine dipstick: NEGATIVE
Ketones, ur: NEGATIVE
Leukocytes,Ua: NEGATIVE
Nitrite: NEGATIVE
Specific Gravity, Urine: 1.025 (ref 1.000–1.030)
Total Protein, Urine: NEGATIVE
Urine Glucose: NEGATIVE
Urobilinogen, UA: 1 (ref 0.0–1.0)
pH: 6 (ref 5.0–8.0)

## 2022-04-12 NOTE — Addendum Note (Signed)
Addended by: [name] on: 04/12/2022 07:51 PM   Modules accepted: Orders

## 2022-04-15 ENCOUNTER — Other Ambulatory Visit: Payer: Self-pay | Admitting: Internal Medicine

## 2022-04-17 ENCOUNTER — Ambulatory Visit: Payer: 59 | Admitting: Podiatry

## 2022-04-17 DIAGNOSIS — R2689 Other abnormalities of gait and mobility: Secondary | ICD-10-CM | POA: Diagnosis not present

## 2022-04-17 DIAGNOSIS — M5459 Other low back pain: Secondary | ICD-10-CM | POA: Diagnosis not present

## 2022-04-18 ENCOUNTER — Ambulatory Visit: Payer: 59 | Admitting: Podiatry

## 2022-04-20 ENCOUNTER — Telehealth: Payer: Self-pay | Admitting: Internal Medicine

## 2022-04-20 ENCOUNTER — Other Ambulatory Visit: Payer: Self-pay

## 2022-04-20 MED ORDER — ONDANSETRON HCL 4 MG PO TABS: 4.0000 mg | ORAL_TABLET | Freq: Four times a day (QID) | ORAL | 0 refills | Status: DC | PRN

## 2022-04-20 MED ORDER — LISINOPRIL 20 MG PO TABS: 20.0000 mg | ORAL_TABLET | Freq: Every day | ORAL | 2 refills | Status: DC

## 2022-04-20 NOTE — Telephone Encounter (Signed)
Patient needs her refill of lisinopril '20mg'$  as she is completely out. She would also like a callback at [phone].

## 2022-04-23 ENCOUNTER — Other Ambulatory Visit: Payer: Self-pay | Admitting: *Deleted

## 2022-04-23 DIAGNOSIS — M4316 Spondylolisthesis, lumbar region: Secondary | ICD-10-CM | POA: Diagnosis not present

## 2022-04-23 DIAGNOSIS — M48062 Spinal stenosis, lumbar region with neurogenic claudication: Secondary | ICD-10-CM | POA: Diagnosis not present

## 2022-04-23 DIAGNOSIS — M5416 Radiculopathy, lumbar region: Secondary | ICD-10-CM | POA: Diagnosis not present

## 2022-04-24 DIAGNOSIS — M545 Low back pain, unspecified: Secondary | ICD-10-CM | POA: Diagnosis not present

## 2022-04-24 DIAGNOSIS — G8929 Other chronic pain: Secondary | ICD-10-CM | POA: Diagnosis not present

## 2022-04-24 DIAGNOSIS — M25551 Pain in right hip: Secondary | ICD-10-CM | POA: Diagnosis not present

## 2022-04-24 DIAGNOSIS — M25561 Pain in right knee: Secondary | ICD-10-CM | POA: Diagnosis not present

## 2022-04-24 DIAGNOSIS — G894 Chronic pain syndrome: Secondary | ICD-10-CM | POA: Diagnosis not present

## 2022-04-26 DIAGNOSIS — R2689 Other abnormalities of gait and mobility: Secondary | ICD-10-CM | POA: Diagnosis not present

## 2022-04-26 DIAGNOSIS — M5459 Other low back pain: Secondary | ICD-10-CM | POA: Diagnosis not present

## 2022-05-01 ENCOUNTER — Ambulatory Visit (INDEPENDENT_AMBULATORY_CARE_PROVIDER_SITE_OTHER): Payer: 59 | Admitting: Podiatry

## 2022-05-01 DIAGNOSIS — L603 Nail dystrophy: Secondary | ICD-10-CM

## 2022-05-01 MED ORDER — CICLOPIROX 8 % EX SOLN: Freq: Every day | CUTANEOUS | 0 refills | Status: DC

## 2022-05-01 NOTE — Progress Notes (Signed)
Subjective:  Patient ID: Misty [name], female    DOB: 03/16/1961,  MRN: [id number]  Chief Complaint  Patient presents with   Nail Problem    62 y.o. female presents with the above complaint.  Patient presents with left second digit nail dystrophy.  Patient states very thickened and again dystrophic mycotic x 1.  Patient states that she wanted discuss treatment options for it.  This nail fungus has been present for quite some time.  She has not tried anything for it.  She would like to discuss treatment options   Review of Systems: Negative except as noted in the HPI. Denies N/V/F/Ch.  Past Medical History:  Diagnosis Date   Allergy    Arthritis    Back pain    Colon cancer ([place])    Depression    Diarrhea    GERD (gastroesophageal reflux disease)    Hypertension    Insomnia    MS (multiple sclerosis) ([place]) 2000   pt says that she doesn't have it   Other hammer toe (acquired) 03/31/2013   Pneumonia    hx of   Pre-diabetes    Umbilical hernia     Current Outpatient Medications:    ciclopirox (PENLAC) 8 % solution, Apply topically at bedtime. Apply over nail and surrounding skin. Apply daily over previous coat. After seven (7) days, may remove with alcohol and continue cycle., Disp: 6.6 mL, Rfl: 0   albuterol (PROAIR HFA) 108 (90 Base) MCG/ACT inhaler, Inhale 1-2 puffs into the lungs every 6 (six) hours as needed for wheezing or shortness of breath., Disp: 1 each, Rfl: 5   aspirin EC 81 MG tablet, Take 81 mg by mouth daily. Swallow whole., Disp: , Rfl:    atorvastatin (LIPITOR) 20 MG tablet, Take 1 tablet (20 mg total) by mouth daily. Annual appt due in Oct must see provider for future refills, Disp: 90 tablet, Rfl: 0   Cholecalciferol (VITAMIN D3) 50 MCG (2000 UT) TABS, TAKE 1 TABLET BY MOUTH EVERY DAY, Disp: 90 tablet, Rfl: 3   cyanocobalamin (,VITAMIN B-12,) 1000 MCG/ML injection, Inject 1,000 mcg into the muscle every 30 (thirty) days., Disp: , Rfl:    diclofenac Sodium  (VOLTAREN) 1 % GEL, APPLY 2 GRAMS TOPICALLY 4 TIMES DAILY, Disp: 100 g, Rfl: 0   docusate sodium (COLACE) 100 MG capsule, Take 1 capsule (100 mg total) by mouth 2 (two) times daily as needed for mild constipation. (Patient taking differently: Take 200 mg by mouth daily.), Disp: 60 capsule, Rfl: 5   ferrous gluconate (FERGON) 324 MG tablet, Take 1 tablet (324 mg total) by mouth daily., Disp: , Rfl: 3   gabapentin (NEURONTIN) 300 MG capsule, TAKE 3 CAPSULES BY MOUTH THREE TIMES DAILY, Disp: 270 capsule, Rfl: 0   lisinopril (ZESTRIL) 20 MG tablet, Take 1 tablet (20 mg total) by mouth daily., Disp: 90 tablet, Rfl: 2   naloxone (NARCAN) nasal spray 4 mg/0.1 mL, Place 1 spray into the nose as needed (opioid overdose)., Disp: , Rfl:    ondansetron (ZOFRAN) 4 MG tablet, Take 1 tablet (4 mg total) by mouth every 6 (six) hours as needed. for nausea, Disp: 20 tablet, Rfl: 0   oxyCODONE-acetaminophen (PERCOCET) 10-325 MG tablet, Take 1 tablet by mouth every 6 (six) hours as needed for pain., Disp: , Rfl:    OZEMPIC, 1 MG/DOSE, 4 MG/3ML SOPN, INJECT 1 MG  AS DIRECTED ONCE A WEEK, Disp: 9 mL, Rfl: 0   senna (SENOKOT) 8.6 MG TABS tablet, Take  1 tablet (8.6 mg total) by mouth 2 (two) times daily., Disp: 120 tablet, Rfl: 0   Syringe/Needle, Disp, (SYRINGE 3CC/25GX1") 25G X 1" 3 ML MISC, Use for monthly B12 injections, Disp: 3 each, Rfl: 3   tiZANidine (ZANAFLEX) 2 MG tablet, Take 1 tablet (2 mg total) by mouth 2 (two) times daily as needed for muscle spasms., Disp: 60 tablet, Rfl: 3   XTAMPZA ER 18 MG C12A, Take 18 mg by mouth every 12 (twelve) hours., Disp: , Rfl:   Social History   Tobacco Use  Smoking Status Some Days   Packs/day: 0.25   Years: 20.00   Total pack years: 5.00   Types: Cigarettes   Last attempt to quit: 09/22/2018   Years since quitting: 3.6  Smokeless Tobacco Never  Tobacco Comments   states she quit 3 months ago    Allergies  Allergen Reactions   Phenergan [Promethazine Hcl] Anxiety    Trazodone And Nefazodone Rash   Objective:  There were no vitals filed for this visit. There is no height or weight on file to calculate BMI. Constitutional Well developed. Well nourished.  Vascular Dorsalis pedis pulses palpable bilaterally. Posterior tibial pulses palpable bilaterally. Capillary refill normal to all digits.  No cyanosis or clubbing noted. Pedal hair growth normal.  Neurologic Normal speech. Oriented to person, place, and time. Epicritic sensation to light touch grossly present bilaterally.  Dermatologic Nails thickened elongated dystrophic mycotic toenails left second digit Skin within normal limits  Orthopedic: Normal joint ROM without pain or crepitus bilaterally. No visible deformities. No bony tenderness.   Radiographs: None Assessment:   1. Nail dystrophy    Plan:  Patient was evaluated and treated and all questions answered.  Left second digit onychodystrophy/nail fungus -All questions and concerns were discussed with the patient in extensive detail -Given the amount of fungus that is present in setting of not wanting to take oral medication such as Lamisil believe patient benefit from Penlac.  Penlac was sent to the pharmacy to help with the nail fungus.  If there is no improvement patient will need total nail avulsion  No follow-ups on file.

## 2022-05-12 ENCOUNTER — Other Ambulatory Visit: Payer: Self-pay | Admitting: Internal Medicine

## 2022-05-16 ENCOUNTER — Other Ambulatory Visit: Payer: Self-pay | Admitting: Internal Medicine

## 2022-05-17 ENCOUNTER — Other Ambulatory Visit: Payer: Self-pay

## 2022-05-18 DIAGNOSIS — M25562 Pain in left knee: Secondary | ICD-10-CM | POA: Diagnosis not present

## 2022-05-23 DIAGNOSIS — M25561 Pain in right knee: Secondary | ICD-10-CM | POA: Diagnosis not present

## 2022-05-23 DIAGNOSIS — M1711 Unilateral primary osteoarthritis, right knee: Secondary | ICD-10-CM | POA: Diagnosis not present

## 2022-05-24 DIAGNOSIS — M25561 Pain in right knee: Secondary | ICD-10-CM | POA: Diagnosis not present

## 2022-05-24 DIAGNOSIS — M25551 Pain in right hip: Secondary | ICD-10-CM | POA: Diagnosis not present

## 2022-05-24 DIAGNOSIS — G8929 Other chronic pain: Secondary | ICD-10-CM | POA: Diagnosis not present

## 2022-05-24 DIAGNOSIS — M545 Low back pain, unspecified: Secondary | ICD-10-CM | POA: Diagnosis not present

## 2022-05-24 DIAGNOSIS — G894 Chronic pain syndrome: Secondary | ICD-10-CM | POA: Diagnosis not present

## 2022-05-25 NOTE — Progress Notes (Signed)
Sent message, via epic in basket, requesting orders in epic from surgeon.  

## 2022-05-29 ENCOUNTER — Ambulatory Visit: Payer: Self-pay | Admitting: Student

## 2022-05-29 DIAGNOSIS — M1711 Unilateral primary osteoarthritis, right knee: Secondary | ICD-10-CM | POA: Diagnosis not present

## 2022-05-29 DIAGNOSIS — Z96651 Presence of right artificial knee joint: Secondary | ICD-10-CM | POA: Diagnosis not present

## 2022-05-29 NOTE — H&P (View-Only) (Signed)
TOTAL KNEE ADMISSION H&P  Patient is being admitted for right total knee arthroplasty.  Subjective:  Chief Complaint:right knee pain.  HPI: Misty [name], 62 y.o. female, has a history of pain and functional disability in the right knee due to arthritis and has failed non-surgical conservative treatments for greater than 12 weeks to includeNSAID's and/or analgesics, corticosteriod injections, flexibility and strengthening excercises, supervised PT with diminished ADL's post treatment, use of assistive devices, and activity modification.  Onset of symptoms was gradual, starting 10 years ago with rapidlly worsening course since that time. The patient noted no past surgery on the right knee(s).  Patient currently rates pain in the right knee(s) at 10 out of 10 with activity. Patient has night pain, worsening of pain with activity and weight bearing, pain that interferes with activities of daily living, pain with passive range of motion, crepitus, and joint swelling.  Patient has evidence of subchondral cysts, subchondral sclerosis, periarticular osteophytes, and joint space narrowing by imaging studies. There is no active infection.  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[place]) 07/08/2020   Bilateral leg edema 06/29/2020   Preop examination 03/21/2020   Osteoarthritis of left hip 02/19/2020   Pain in joint of right hip 10/14/2019   COVID-19 virus infection 04/18/2019   Urinary frequency 03/10/2019   Restless leg syndrome 03/10/2019   Hyperlipidemia 11/27/2018   Anemia 11/25/2018   Nausea  09/17/2018   Right shoulder pain 03/03/2018   Pancreatitis 02/22/2018   Alcohol abuse 02/22/2018   Cholelithiasis 02/22/2018   Adhesive capsulitis of right shoulder 01/20/2018   Ventral hernia 09/16/2017   Greater trochanteric bursitis of right hip 04/30/2017   Prediabetes 04/16/2017   Spinal stenosis of lumbar region 11/29/2015   Positive urine drug screen [id number]   Umbilical hernia [id number]   Sleeping difficulties 07/15/2015   Bunion, right 06/20/2015   Muscle spasm of back 04/16/2015   B12 deficiency 03/30/2015   Hammer toe of left foot 02/16/2015   Fibrosis of skin of lower extremity 02/16/2015   Cervical spondylosis with radiculopathy 03/01/2014   Hammer toe of right foot 01/26/2014   Benign intracranial hypertension 06/18/2013   Porokeratosis 03/31/2013   Vitamin D deficiency 04/18/2010   MEDIAL MENISCUS TEAR, LEFT 06/30/2008   Current tear knee, medial meniscus 06/30/2008   BARRETTS ESOPHAGUS 06/28/2008   Knee osteoarthritis 06/28/2008   Morbid obesity with BMI of 40.0-44.9, adult ([place]) 05/24/2008   HYPERTENSION, BENIGN ESSENTIAL 05/24/2008   Irritable bowel syndrome 05/24/2008   BARIATRIC SURGERY STATUS 03/19/2006   History of bariatric surgery 03/19/2006   History of malignant neoplasm of large intestine 03/19/1997   Past Medical History:  Diagnosis Date   Allergy    Arthritis    Back pain    Colon cancer ([place])    Depression    Diarrhea    GERD (gastroesophageal reflux disease)    Hypertension    Insomnia    MS (multiple sclerosis) ([place]) 2000   pt says that she doesn't have it   Other hammer toe (acquired) 03/31/2013   Pneumonia    hx of  Pre-diabetes    Umbilical hernia     Past Surgical History:  Procedure Laterality Date   ABDOMINAL HYSTERECTOMY     ANTERIOR CERVICAL DECOMP/DISCECTOMY FUSION N/A 03/01/2014   Procedure: ANTERIOR CERVICAL DECOMPRESSION/DISCECTOMY FUSION 1 LEVEL;  Surgeon: Charlie Pitter, MD;  Location: [facility];  Service:  Neurosurgery;  Laterality: N/A;  ANTERIOR CERVICAL DECOMPRESSION/DISCECTOMY FUSION 1 LEVEL   BUNIONECTOMY Right 10/14/2014   @[facility]    CESAREAN SECTION     COLON SURGERY     COLONOSCOPY     FOOT SURGERY Right    GASTRIC BYPASS     Hammer Toe Repair Right 10/14/2014   RT #2, @[facility]    INCISION AND DRAINAGE HIP Left 06/02/2020   Procedure: IRRIGATION AND DEBRIDEMENT LEFT HIP, POSSIBLE HEAD BALL AND LINER EXCHANGE;  Surgeon: Rod Can, MD;  Location: [facility];  Service: Orthopedics;  Laterality: Left;  59min   TENOTOMY Right 10/14/2014   RT #3, @[facility]    TOTAL HIP ARTHROPLASTY Left 04/28/2020   Procedure: TOTAL HIP ARTHROPLASTY ANTERIOR APPROACH;  Surgeon: Rod Can, MD;  Location: [facility];  Service: Orthopedics;  Laterality: Left;  3E bed   TOTAL HIP ARTHROPLASTY Right 01/25/2021   Procedure: TOTAL HIP ARTHROPLASTY ANTERIOR APPROACH;  Surgeon: Rod Can, MD;  Location: [facility];  Service: Orthopedics;  Laterality: Right;    Current Outpatient Medications  Medication Sig Dispense Refill Last Dose   albuterol (PROAIR HFA) 108 (90 Base) MCG/ACT inhaler Inhale 1-2 puffs into the lungs every 6 (six) hours as needed for wheezing or shortness of breath. (Patient not taking: Reported on 06/01/2022) 1 each 5    amLODipine (NORVASC) 2.5 MG tablet Take 2.5 mg by mouth daily.      aspirin EC 81 MG tablet Take 81 mg by mouth daily. Swallow whole.      atorvastatin (LIPITOR) 20 MG tablet Take 1 tablet (20 mg total) by mouth daily. Annual appt due in Oct must see provider for future refills 90 tablet 0    Cholecalciferol (VITAMIN D3) 50 MCG (2000 UT) TABS TAKE 1 TABLET BY MOUTH EVERY DAY 90 tablet 3    ciclopirox (PENLAC) 8 % solution Apply topically at bedtime. Apply over nail and surrounding skin. Apply daily over previous coat. After seven (7) days, may remove with alcohol and continue cycle. 6.6 mL 0    cyanocobalamin (,VITAMIN B-12,) 1000 MCG/ML injection Inject 1,000 mcg into the muscle every 30  (thirty) days.      diclofenac Sodium (VOLTAREN) 1 % GEL APPLY 2 GRAMS TOPICALLY 4 TIMES DAILY (Patient taking differently: Apply 2 g topically 4 (four) times daily.) 100 g 0    docusate sodium (COLACE) 100 MG capsule Take 1 capsule (100 mg total) by mouth 2 (two) times daily as needed for mild constipation. (Patient taking differently: Take 200 mg by mouth daily.) 60 capsule 5    ferrous gluconate (FERGON) 324 MG tablet Take 1 tablet (324 mg total) by mouth daily.  3    gabapentin (NEURONTIN) 300 MG capsule TAKE 3 CAPSULES BY MOUTH THREE TIMES DAILY (Patient taking differently: Take 300-900 mg by mouth 3 (three) times daily.) 270 capsule 0    lisinopril (ZESTRIL) 20 MG tablet Take 1 tablet (20 mg total) by mouth daily. 90 tablet 2    MELATONIN PO Take 1 tablet by mouth as needed (sleep).      naloxone (NARCAN) nasal spray 4 mg/0.1 mL Place 1 spray into the nose as needed (opioid overdose).  ondansetron (ZOFRAN) 4 MG tablet Take 1 tablet (4 mg total) by mouth every 6 (six) hours as needed. for nausea 20 tablet 0    oxyCODONE-acetaminophen (PERCOCET) 10-325 MG tablet Take 1 tablet by mouth every 6 (six) hours as needed for pain.      OZEMPIC, 1 MG/DOSE, 4 MG/3ML SOPN INJECT 1 MG  AS DIRECTED ONCE A WEEK (Patient taking differently: Inject 1 mg into the skin once a week.) 9 mL 0    senna (SENOKOT) 8.6 MG TABS tablet Take 1 tablet (8.6 mg total) by mouth 2 (two) times daily. (Patient not taking: Reported on 06/01/2022) 120 tablet 0    Syringe/Needle, Disp, (SYRINGE 3CC/25GX1") 25G X 1" 3 ML MISC Use for monthly B12 injections 3 each 3    tiZANidine (ZANAFLEX) 2 MG tablet Take 1 tablet (2 mg total) by mouth 2 (two) times daily as needed for muscle spasms. 60 tablet 3    Turmeric (QC TUMERIC COMPLEX PO) Take 1 tablet by mouth daily.      XTAMPZA ER 18 MG C12A Take 18 mg by mouth every 12 (twelve) hours.      No current facility-administered medications for this visit.   Allergies  Allergen  Reactions   Phenergan [Promethazine Hcl] Anxiety   Trazodone And Nefazodone Rash    Social History   Tobacco Use   Smoking status: Some Days    Packs/day: 0.25    Years: 20.00    Additional pack years: 0.00    Total pack years: 5.00    Types: Cigarettes    Last attempt to quit: 09/22/2018    Years since quitting: 3.7   Smokeless tobacco: Never   Tobacco comments:    states she quit 3 months ago  Substance Use Topics   Alcohol use: Yes    Alcohol/week: 0.0 standard drinks of alcohol    Comment: social    Family History  Problem Relation Age of Onset   Stroke Mother    Hypertension Mother    Hyperlipidemia Mother    Diabetes Mother    Diabetes Brother    Hypertension Brother    Hypertension Brother    Hypertension Brother    Hypertension Brother    Hypertension Brother    Alcoholism Brother    Breast cancer Neg Hx      Review of Systems  Musculoskeletal:  Positive for arthralgias, gait problem and joint swelling.  All other systems reviewed and are negative.   Objective:  Physical Exam Constitutional:      Appearance: Normal appearance.  HENT:     Head: Normocephalic and atraumatic.     Nose: Nose normal.     Mouth/Throat:     Mouth: Mucous membranes are moist.     Pharynx: Oropharynx is clear.  Eyes:     Conjunctiva/sclera: Conjunctivae normal.  Cardiovascular:     Rate and Rhythm: Normal rate and regular rhythm.     Pulses: Normal pulses.     Heart sounds: Normal heart sounds.  Pulmonary:     Effort: Pulmonary effort is normal.     Breath sounds: Normal breath sounds.  Abdominal:     General: Abdomen is flat.     Palpations: Abdomen is soft.  Genitourinary:    Comments: deferred Musculoskeletal:     Cervical back: Normal range of motion and neck supple.     Comments: Examination of the right knee reveals no skin wounds or lesions. She has swelling, trace effusion. No warmth or erythema. Varus  deformity. Tenderness to palpation medial joint line,  lateral joint line, peripatellar retinacular tissues with a positive grind sign. Range of motion is 5 to 100 degrees without any ligamentous instability. She does have varus/valgus pseudolaxity. No extensor lag.  Skin:    General: Skin is warm and dry.     Capillary Refill: Capillary refill takes less than 2 seconds.  Neurological:     General: No focal deficit present.     Mental Status: She is alert and oriented to person, place, and time.  Psychiatric:        Mood and Affect: Mood normal.        Behavior: Behavior normal.        Thought Content: Thought content normal.        Judgment: Judgment normal.     Vital signs in last 24 hours: @VSRANGES @  Labs:   Estimated body mass index is 36.38 kg/m as calculated from the following:   Height as of 06/01/22: 5' 5.5" (1.664 m).   Weight as of 06/01/22: 100.7 kg.   Imaging Review Plain radiographs demonstrate severe degenerative joint disease of the right knee(s). The overall alignment issignificant varus. The bone quality appears to be adequate for age and reported activity level.      Assessment/Plan:  End stage arthritis, right knee   The patient history, physical examination, clinical judgment of the provider and imaging studies are consistent with end stage degenerative joint disease of the right knee(s) and total knee arthroplasty is deemed medically necessary. The treatment options including medical management, injection therapy arthroscopy and arthroplasty were discussed at length. The risks and benefits of total knee arthroplasty were presented and reviewed. The risks due to aseptic loosening, infection, stiffness, patella tracking problems, thromboembolic complications and other imponderables were discussed. The patient acknowledged the explanation, agreed to proceed with the plan and consent was signed. Patient is being admitted for inpatient treatment for surgery, pain control, PT, OT, prophylactic antibiotics, VTE  prophylaxis, progressive ambulation and ADL's and discharge planning. The patient is planning to be discharged home with OPPT after an overnight stay.   Therapy Plans: outpatient therapy. [facility] 1st appointment.  Disposition: Home with daughters Planned DVT Prophylaxis: aspirin 81mg  BID DME needed. Cane order and printed today for patient. Has rolling walker. Has iceman ice machine.  PCP: Cleared.  TXA: IV Allergies:  - Promethazine - anxiety/agitation  Anesthesia Concerns: None.  BMI: 38.3 Last HgbA1c: 5.5 Other:** - Pain management, oxycodone 10/325 q 6 hours, Xtampza BID, gabapentin TID - Ozempic, stop 7 days prior to surgery.  - Aspirin 81mg  baseline.  - Dilaudid 4mg  every 4 hour for pain did not work last time for her THA's.  - Oxycodone increased dose, zofran, has tizanidine, meloxicam.  - 04/11/22: Hgb 12.4, Cr. 0.78, K+ 4.9.  -  06/01/22: Hgb 11.9, Cr. 0.79, K+ 4.3.   Patient's anticipated LOS is less than 2 midnights, meeting these requirements: - Younger than 67 - Lives within 1 hour of care - Has a competent adult at home to recover with post-op recover - NO history of  - Chronic pain requiring opiods  - Diabetes  - Coronary Artery Disease  - Heart failure  - Heart attack  - Stroke  - DVT/VTE  - Cardiac arrhythmia  - Respiratory Failure/COPD  - Renal failure  - Anemia  - Advanced Liver disease

## 2022-05-29 NOTE — H&P (Signed)
TOTAL KNEE ADMISSION H&P  Patient is being admitted for right total knee arthroplasty.  Subjective:  Chief Complaint:right knee pain.  HPI: Misty [name], 62 y.o. female, has a history of pain and functional disability in the right knee due to arthritis and has failed non-surgical conservative treatments for greater than 12 weeks to includeNSAID's and/or analgesics, corticosteriod injections, flexibility and strengthening excercises, supervised PT with diminished ADL's post treatment, use of assistive devices, and activity modification.  Onset of symptoms was gradual, starting 10 years ago with rapidlly worsening course since that time. The patient noted no past surgery on the right knee(s).  Patient currently rates pain in the right knee(s) at 10 out of 10 with activity. Patient has night pain, worsening of pain with activity and weight bearing, pain that interferes with activities of daily living, pain with passive range of motion, crepitus, and joint swelling.  Patient has evidence of subchondral cysts, subchondral sclerosis, periarticular osteophytes, and joint space narrowing by imaging studies. There is no active infection.  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[place]) 07/08/2020   Bilateral leg edema 06/29/2020   Preop examination 03/21/2020   Osteoarthritis of left hip 02/19/2020   Pain in joint of right hip 10/14/2019   COVID-19 virus infection 04/18/2019   Urinary frequency 03/10/2019   Restless leg syndrome 03/10/2019   Hyperlipidemia 11/27/2018   Anemia 11/25/2018   Nausea  09/17/2018   Right shoulder pain 03/03/2018   Pancreatitis 02/22/2018   Alcohol abuse 02/22/2018   Cholelithiasis 02/22/2018   Adhesive capsulitis of right shoulder 01/20/2018   Ventral hernia 09/16/2017   Greater trochanteric bursitis of right hip 04/30/2017   Prediabetes 04/16/2017   Spinal stenosis of lumbar region 11/29/2015   Positive urine drug screen [id number]   Umbilical hernia [id number]   Sleeping difficulties 07/15/2015   Bunion, right 06/20/2015   Muscle spasm of back 04/16/2015   B12 deficiency 03/30/2015   Hammer toe of left foot 02/16/2015   Fibrosis of skin of lower extremity 02/16/2015   Cervical spondylosis with radiculopathy 03/01/2014   Hammer toe of right foot 01/26/2014   Benign intracranial hypertension 06/18/2013   Porokeratosis 03/31/2013   Vitamin D deficiency 04/18/2010   MEDIAL MENISCUS TEAR, LEFT 06/30/2008   Current tear knee, medial meniscus 06/30/2008   BARRETTS ESOPHAGUS 06/28/2008   Knee osteoarthritis 06/28/2008   Morbid obesity with BMI of 40.0-44.9, adult ([place]) 05/24/2008   HYPERTENSION, BENIGN ESSENTIAL 05/24/2008   Irritable bowel syndrome 05/24/2008   BARIATRIC SURGERY STATUS 03/19/2006   History of bariatric surgery 03/19/2006   History of malignant neoplasm of large intestine 03/19/1997   Past Medical History:  Diagnosis Date   Allergy    Arthritis    Back pain    Colon cancer ([place])    Depression    Diarrhea    GERD (gastroesophageal reflux disease)    Hypertension    Insomnia    MS (multiple sclerosis) ([place]) 2000   pt says that she doesn't have it   Other hammer toe (acquired) 03/31/2013   Pneumonia    hx of  Pre-diabetes    Umbilical hernia     Past Surgical History:  Procedure Laterality Date   ABDOMINAL HYSTERECTOMY     ANTERIOR CERVICAL DECOMP/DISCECTOMY FUSION N/A 03/01/2014   Procedure: ANTERIOR CERVICAL DECOMPRESSION/DISCECTOMY FUSION 1 LEVEL;  Surgeon: Charlie Pitter, MD;  Location: [facility];  Service:  Neurosurgery;  Laterality: N/A;  ANTERIOR CERVICAL DECOMPRESSION/DISCECTOMY FUSION 1 LEVEL   BUNIONECTOMY Right 10/14/2014   @[facility]    CESAREAN SECTION     COLON SURGERY     COLONOSCOPY     FOOT SURGERY Right    GASTRIC BYPASS     Hammer Toe Repair Right 10/14/2014   RT #2, @[facility]    INCISION AND DRAINAGE HIP Left 06/02/2020   Procedure: IRRIGATION AND DEBRIDEMENT LEFT HIP, POSSIBLE HEAD BALL AND LINER EXCHANGE;  Surgeon: Rod Can, MD;  Location: [facility];  Service: Orthopedics;  Laterality: Left;  59min   TENOTOMY Right 10/14/2014   RT #3, @[facility]    TOTAL HIP ARTHROPLASTY Left 04/28/2020   Procedure: TOTAL HIP ARTHROPLASTY ANTERIOR APPROACH;  Surgeon: Rod Can, MD;  Location: [facility];  Service: Orthopedics;  Laterality: Left;  3E bed   TOTAL HIP ARTHROPLASTY Right 01/25/2021   Procedure: TOTAL HIP ARTHROPLASTY ANTERIOR APPROACH;  Surgeon: Rod Can, MD;  Location: [facility];  Service: Orthopedics;  Laterality: Right;    Current Outpatient Medications  Medication Sig Dispense Refill Last Dose   albuterol (PROAIR HFA) 108 (90 Base) MCG/ACT inhaler Inhale 1-2 puffs into the lungs every 6 (six) hours as needed for wheezing or shortness of breath. (Patient not taking: Reported on 06/01/2022) 1 each 5    amLODipine (NORVASC) 2.5 MG tablet Take 2.5 mg by mouth daily.      aspirin EC 81 MG tablet Take 81 mg by mouth daily. Swallow whole.      atorvastatin (LIPITOR) 20 MG tablet Take 1 tablet (20 mg total) by mouth daily. Annual appt due in Oct must see provider for future refills 90 tablet 0    Cholecalciferol (VITAMIN D3) 50 MCG (2000 UT) TABS TAKE 1 TABLET BY MOUTH EVERY DAY 90 tablet 3    ciclopirox (PENLAC) 8 % solution Apply topically at bedtime. Apply over nail and surrounding skin. Apply daily over previous coat. After seven (7) days, may remove with alcohol and continue cycle. 6.6 mL 0    cyanocobalamin (,VITAMIN B-12,) 1000 MCG/ML injection Inject 1,000 mcg into the muscle every 30  (thirty) days.      diclofenac Sodium (VOLTAREN) 1 % GEL APPLY 2 GRAMS TOPICALLY 4 TIMES DAILY (Patient taking differently: Apply 2 g topically 4 (four) times daily.) 100 g 0    docusate sodium (COLACE) 100 MG capsule Take 1 capsule (100 mg total) by mouth 2 (two) times daily as needed for mild constipation. (Patient taking differently: Take 200 mg by mouth daily.) 60 capsule 5    ferrous gluconate (FERGON) 324 MG tablet Take 1 tablet (324 mg total) by mouth daily.  3    gabapentin (NEURONTIN) 300 MG capsule TAKE 3 CAPSULES BY MOUTH THREE TIMES DAILY (Patient taking differently: Take 300-900 mg by mouth 3 (three) times daily.) 270 capsule 0    lisinopril (ZESTRIL) 20 MG tablet Take 1 tablet (20 mg total) by mouth daily. 90 tablet 2    MELATONIN PO Take 1 tablet by mouth as needed (sleep).      naloxone (NARCAN) nasal spray 4 mg/0.1 mL Place 1 spray into the nose as needed (opioid overdose).  ondansetron (ZOFRAN) 4 MG tablet Take 1 tablet (4 mg total) by mouth every 6 (six) hours as needed. for nausea 20 tablet 0    oxyCODONE-acetaminophen (PERCOCET) 10-325 MG tablet Take 1 tablet by mouth every 6 (six) hours as needed for pain.      OZEMPIC, 1 MG/DOSE, 4 MG/3ML SOPN INJECT 1 MG  AS DIRECTED ONCE A WEEK (Patient taking differently: Inject 1 mg into the skin once a week.) 9 mL 0    senna (SENOKOT) 8.6 MG TABS tablet Take 1 tablet (8.6 mg total) by mouth 2 (two) times daily. (Patient not taking: Reported on 06/01/2022) 120 tablet 0    Syringe/Needle, Disp, (SYRINGE 3CC/25GX1") 25G X 1" 3 ML MISC Use for monthly B12 injections 3 each 3    tiZANidine (ZANAFLEX) 2 MG tablet Take 1 tablet (2 mg total) by mouth 2 (two) times daily as needed for muscle spasms. 60 tablet 3    Turmeric (QC TUMERIC COMPLEX PO) Take 1 tablet by mouth daily.      XTAMPZA ER 18 MG C12A Take 18 mg by mouth every 12 (twelve) hours.      No current facility-administered medications for this visit.   Allergies  Allergen  Reactions   Phenergan [Promethazine Hcl] Anxiety   Trazodone And Nefazodone Rash    Social History   Tobacco Use   Smoking status: Some Days    Packs/day: 0.25    Years: 20.00    Additional pack years: 0.00    Total pack years: 5.00    Types: Cigarettes    Last attempt to quit: 09/22/2018    Years since quitting: 3.7   Smokeless tobacco: Never   Tobacco comments:    states she quit 3 months ago  Substance Use Topics   Alcohol use: Yes    Alcohol/week: 0.0 standard drinks of alcohol    Comment: social    Family History  Problem Relation Age of Onset   Stroke Mother    Hypertension Mother    Hyperlipidemia Mother    Diabetes Mother    Diabetes Brother    Hypertension Brother    Hypertension Brother    Hypertension Brother    Hypertension Brother    Hypertension Brother    Alcoholism Brother    Breast cancer Neg Hx      Review of Systems  Musculoskeletal:  Positive for arthralgias, gait problem and joint swelling.  All other systems reviewed and are negative.   Objective:  Physical Exam Constitutional:      Appearance: Normal appearance.  HENT:     Head: Normocephalic and atraumatic.     Nose: Nose normal.     Mouth/Throat:     Mouth: Mucous membranes are moist.     Pharynx: Oropharynx is clear.  Eyes:     Conjunctiva/sclera: Conjunctivae normal.  Cardiovascular:     Rate and Rhythm: Normal rate and regular rhythm.     Pulses: Normal pulses.     Heart sounds: Normal heart sounds.  Pulmonary:     Effort: Pulmonary effort is normal.     Breath sounds: Normal breath sounds.  Abdominal:     General: Abdomen is flat.     Palpations: Abdomen is soft.  Genitourinary:    Comments: deferred Musculoskeletal:     Cervical back: Normal range of motion and neck supple.     Comments: Examination of the right knee reveals no skin wounds or lesions. She has swelling, trace effusion. No warmth or erythema. Varus  deformity. Tenderness to palpation medial joint line,  lateral joint line, peripatellar retinacular tissues with a positive grind sign. Range of motion is 5 to 100 degrees without any ligamentous instability. She does have varus/valgus pseudolaxity. No extensor lag.  Skin:    General: Skin is warm and dry.     Capillary Refill: Capillary refill takes less than 2 seconds.  Neurological:     General: No focal deficit present.     Mental Status: She is alert and oriented to person, place, and time.  Psychiatric:        Mood and Affect: Mood normal.        Behavior: Behavior normal.        Thought Content: Thought content normal.        Judgment: Judgment normal.     Vital signs in last 24 hours: @VSRANGES @  Labs:   Estimated body mass index is 36.38 kg/m as calculated from the following:   Height as of 06/01/22: 5' 5.5" (1.664 m).   Weight as of 06/01/22: 100.7 kg.   Imaging Review Plain radiographs demonstrate severe degenerative joint disease of the right knee(s). The overall alignment issignificant varus. The bone quality appears to be adequate for age and reported activity level.      Assessment/Plan:  End stage arthritis, right knee   The patient history, physical examination, clinical judgment of the provider and imaging studies are consistent with end stage degenerative joint disease of the right knee(s) and total knee arthroplasty is deemed medically necessary. The treatment options including medical management, injection therapy arthroscopy and arthroplasty were discussed at length. The risks and benefits of total knee arthroplasty were presented and reviewed. The risks due to aseptic loosening, infection, stiffness, patella tracking problems, thromboembolic complications and other imponderables were discussed. The patient acknowledged the explanation, agreed to proceed with the plan and consent was signed. Patient is being admitted for inpatient treatment for surgery, pain control, PT, OT, prophylactic antibiotics, VTE  prophylaxis, progressive ambulation and ADL's and discharge planning. The patient is planning to be discharged home with OPPT after an overnight stay.   Therapy Plans: outpatient therapy. [facility] 1st appointment.  Disposition: Home with daughters Planned DVT Prophylaxis: aspirin 81mg  BID DME needed. Cane order and printed today for patient. Has rolling walker. Has iceman ice machine.  PCP: Cleared.  TXA: IV Allergies:  - Promethazine - anxiety/agitation  Anesthesia Concerns: None.  BMI: 38.3 Last HgbA1c: 5.5 Other:** - Pain management, oxycodone 10/325 q 6 hours, Xtampza BID, gabapentin TID - Ozempic, stop 7 days prior to surgery.  - Aspirin 81mg  baseline.  - Dilaudid 4mg  every 4 hour for pain did not work last time for her THA's.  - Oxycodone increased dose, zofran, has tizanidine, meloxicam.  - 04/11/22: Hgb 12.4, Cr. 0.78, K+ 4.9.  -  06/01/22: Hgb 11.9, Cr. 0.79, K+ 4.3.   Patient's anticipated LOS is less than 2 midnights, meeting these requirements: - Younger than 67 - Lives within 1 hour of care - Has a competent adult at home to recover with post-op recover - NO history of  - Chronic pain requiring opiods  - Diabetes  - Coronary Artery Disease  - Heart failure  - Heart attack  - Stroke  - DVT/VTE  - Cardiac arrhythmia  - Respiratory Failure/COPD  - Renal failure  - Anemia  - Advanced Liver disease

## 2022-05-31 ENCOUNTER — Ambulatory Visit: Payer: Medicare Other | Admitting: Internal Medicine

## 2022-05-31 NOTE — Patient Instructions (Addendum)
SURGICAL WAITING ROOM VISITATION Patients having surgery or a procedure may have no more than 2 support people in the waiting area - these visitors may rotate in the visitor waiting room.   Due to an increase in RSV and influenza rates and associated hospitalizations, children ages 59 and under may not visit patients in [place]. If the patient needs to stay at the hospital during part of their recovery, the visitor guidelines for inpatient rooms apply.  PRE-OP VISITATION  Pre-op nurse will coordinate an appropriate time for 1 support person to accompany the patient in pre-op.  This support person may not rotate.  This visitor will be contacted when the time is appropriate for the visitor to come back in the pre-op area.  Please refer to the [facility] website for the visitor guidelines for Inpatients (after your surgery is over and you are in a regular room).  You are not required to quarantine at this time prior to your surgery. However, you must do this: Hand Hygiene often Do NOT share personal items Notify your provider if you are in close contact with someone who has COVID or you develop fever 100.4 or greater, new onset of sneezing, cough, sore throat, shortness of breath or body aches.  If you test positive for Covid or have been in contact with anyone that has tested positive in the last 10 days please notify you surgeon.    Your procedure is scheduled on: Thursday  June 14, 2022   Report to [facility] Main Entrance: [place] entrance where the [employer] is available.   Report to admitting at:  06:45   AM  +++++Call this number if you have any questions or problems the morning of surgery [phone]  Do not eat food after Midnight the night prior to your surgery/procedure.  After Midnight you may have the following liquids until    06:15 AM DAY OF SURGERY  Clear Liquid Diet Water Black Coffee (sugar ok, NO MILK/CREAM OR CREAMERS)  Tea (sugar ok,  NO MILK/CREAM OR CREAMERS) regular and decaf                             Plain Jell-O  with no fruit (NO RED)                                           Fruit ices (not with fruit pulp, NO RED)                                     Popsicles (NO RED)                                                                  Juice: apple, WHITE grape, WHITE cranberry Sports drinks like Gatorade or Powerade (NO RED)                   The day of surgery:  Drink ONE (1) Pre-Surgery  G2 at 06:15 am       AM the  morning of surgery. Drink in one sitting. Do not sip.  This drink was given to you during your hospital pre-op appointment visit. Nothing else to drink after completing the Pre-Surgery Clear Ensure or G2 : No candy, chewing gum or throat lozenges.    FOLLOW  ANY ADDITIONAL PRE OP INSTRUCTIONS YOU RECEIVED FROM YOUR SURGEON'S OFFICE!!!   Oral Hygiene is also important to reduce your risk of infection.        Remember - BRUSH YOUR TEETH THE MORNING OF SURGERY WITH YOUR REGULAR TOOTHPASTE  Do NOT smoke after Midnight the night before surgery.  Take ONLY these medicines the morning of surgery with A SIP OF WATER: gabapentin, and Amlodipine (If BP is not too low)                  You may not have any metal on your body including hair pins, jewelry, and body piercing  Do not wear make-up, lotions, powders, perfumes or deodorant  Do not wear nail polish including gel and S&S, artificial / acrylic nails, or any other type of covering on natural nails including finger and toenails. If you have artificial nails, gel coating, etc., that needs to be removed by a nail salon, Please have this removed prior to surgery. Not doing so may mean that your surgery could be cancelled or delayed if the Surgeon or anesthesia staff feels like they are unable to monitor you safely.   Do not shave 48 hours prior to surgery to avoid nicks in your skin which may contribute to postoperative infections.   Contacts, Hearing  Aids, dentures or bridgework may not be worn into surgery. DENTURES WILL BE REMOVED PRIOR TO SURGERY PLEASE DO NOT APPLY "Poly grip" OR ADHESIVES!!!  You may bring a small overnight bag with you on the day of surgery, only pack items that are not valuable. [facility] IS NOT RESPONSIBLE   FOR VALUABLES THAT ARE LOST OR STOLEN.    Do not bring your home medications to the hospital. The Pharmacy will dispense medications listed on your medication list to you during your admission in the Hospital.  Special Instructions: Bring a copy of your healthcare power of attorney and living will documents the day of surgery, if you wish to have them scanned into your [facility] Records- EPIC  Please read over the following fact sheets you were given: IF YOU HAVE QUESTIONS ABOUT YOUR [place], [place] [phone].   Experiment - Preparing for Surgery Before surgery, you can play an important role.  Because skin is not sterile, your skin needs to be as free of germs as possible.  You can reduce the number of germs on your skin by washing with CHG (chlorahexidine gluconate) soap before surgery.  CHG is an antiseptic cleaner which kills germs and bonds with the skin to continue killing germs even after washing. Please DO NOT use if you have an allergy to CHG or antibacterial soaps.  If your skin becomes reddened/irritated stop using the CHG and inform your nurse when you arrive at Short Stay. Do not shave (including legs and underarms) for at least 48 hours prior to the first CHG shower.  You may shave your face/neck.  Please follow these instructions carefully:  1.  Shower with CHG Soap the night before surgery and the  morning of surgery.  2.  If you choose to wash your hair, wash your hair first as usual with your normal  shampoo.  3.  After  you shampoo, rinse your hair and body thoroughly to remove the shampoo.                             4.  Use CHG as you would any other liquid  soap.  You can apply chg directly to the skin and wash.  Gently with a scrungie or clean washcloth.  5.  Apply the CHG Soap to your body ONLY FROM THE NECK DOWN.   Do not use on face/ open                           Wound or open sores. Avoid contact with eyes, ears mouth and genitals (private parts).                       Wash face,  Genitals (private parts) with your normal soap.             6.  Wash thoroughly, paying special attention to the area where your  surgery  will be performed.  7.  Thoroughly rinse your body with warm water from the neck down.  8.  DO NOT shower/wash with your normal soap after using and rinsing off the CHG Soap.            9.  Pat yourself dry with a clean towel.            10.  Wear clean pajamas.            11.  Place clean sheets on your bed the night of your first shower and do not  sleep with pets.  ON THE DAY OF SURGERY : Do not apply any lotions/deodorants the morning of surgery.  Please wear clean clothes to the hospital/surgery center.    FAILURE TO FOLLOW THESE INSTRUCTIONS MAY RESULT IN THE CANCELLATION OF YOUR SURGERY  PATIENT SIGNATURE_________________________________  NURSE SIGNATURE__________________________________  ________________________________________________________________________       Misty [name]    An incentive spirometer is a tool that can help keep your lungs clear and active. This tool measures how well you are filling your lungs with each breath. Taking long deep breaths may help reverse or decrease the chance of developing breathing (pulmonary) problems (especially infection) following: A long period of time when you are unable to move or be active. BEFORE THE PROCEDURE  If the spirometer includes an indicator to show your best effort, your nurse or respiratory therapist will set it to a desired goal. If possible, sit up straight or lean slightly forward. Try not to slouch. Hold the incentive spirometer in an  upright position. INSTRUCTIONS FOR USE  Sit on the edge of your bed if possible, or sit up as far as you can in bed or on a chair. Hold the incentive spirometer in an upright position. Breathe out normally. Place the mouthpiece in your mouth and seal your lips tightly around it. Breathe in slowly and as deeply as possible, raising the piston or the ball toward the top of the column. Hold your breath for 3-5 seconds or for as long as possible. Allow the piston or ball to fall to the bottom of the column. Remove the mouthpiece from your mouth and breathe out normally. Rest for a few seconds and repeat Steps 1 through 7 at least 10 times every 1-2 hours when you are awake. Take your time and take a  few normal breaths between deep breaths. The spirometer may include an indicator to show your best effort. Use the indicator as a goal to work toward during each repetition. After each set of 10 deep breaths, practice coughing to be sure your lungs are clear. If you have an incision (the cut made at the time of surgery), support your incision when coughing by placing a pillow or rolled up towels firmly against it. Once you are able to get out of bed, walk around indoors and cough well. You may stop using the incentive spirometer when instructed by your caregiver.  RISKS AND COMPLICATIONS Take your time so you do not get dizzy or light-headed. If you are in pain, you may need to take or ask for pain medication before doing incentive spirometry. It is harder to take a deep breath if you are having pain. AFTER USE Rest and breathe slowly and easily. It can be helpful to keep track of a log of your progress. Your caregiver can provide you with a simple table to help with this. If you are using the spirometer at home, follow these instructions: [place] IF:  You are having difficultly using the spirometer. You have trouble using the spirometer as often as instructed. Your pain medication is not  giving enough relief while using the spirometer. You develop fever of 100.5 F (38.1 C) or higher.                                                                                                    SEEK IMMEDIATE MEDICAL CARE IF:  You cough up bloody sputum that had not been present before. You develop fever of 102 F (38.9 C) or greater. You develop worsening pain at or near the incision site. MAKE SURE YOU:  Understand these instructions. Will watch your condition. Will get help right away if you are not doing well or get worse. Document Released: 07/16/2006 Document Revised: 05/28/2011 Document Reviewed: 09/16/2006 [facility] Patient Information 2014 [place], [place].

## 2022-05-31 NOTE — Progress Notes (Addendum)
COVID Vaccine received:  []  No [x]  Yes Date of any COVID positive Test in last 90 days:  None  PCP - Billey Gosling, MD at [facility], clearance in [place] note 04-11-2022 Cardiologist -  none Pain Med- Almyra Free, MD at  [facility]   Chest x-ray -  EKG -  (03-21-2020) will repeat at PST Stress Test -  ECHO -  Cardiac Cath -   PCR screen: [x]  Ordered & Completed                      []   No Order but Needs PROFEND                      []   N/A for this surgery  Surgery Plan:  []  Ambulatory                            [x]  Outpatient in bed                            []  Admit  Anesthesia:    []  General  [x]  Spinal                           []   Choice []   MAC  Pacemaker / ICD device [x]  No []  Yes        Device order form faxed [x]  No    []   Yes      Faxed to:  Spinal Cord Stimulator:[x]  No []  Yes      (Remind patient to bring remote DOS) Other Implants:   History of Sleep Apnea? [x]  No []  Yes   CPAP used?- [x]  No []  Yes    Does the patient monitor blood sugar? [x]  No []  Yes  []  N/A  Patient has: [x]  Pre-DM   []  DM1  []   DM2 Does patient have a [employer] or Dexacom? []  No []  Yes   Fasting Blood Sugar Ranges-  Checks Blood Sugar _____ times a day  GLP1 agonist- OZEMPIC - Sunday GLP1 instructions: Hold x 1 week Last Injection: Sunday 06-03-2022  Blood Thinner / Instructions:  None Aspirin Instructions:  ASA 81 mg  ERAS Protocol Ordered: []  No  [x]  Yes PRE-SURGERY []  ENSURE  [x]  G2  Patient is to be NPO after: 06:15 am  Comments:   Activity level:   Unable to go up steps due to hip pain.  Able to pererform activities of daily living without stopping and without symptoms of chest pain or shortness of breath. .   Anesthesia review:  HTN, ACDF 1 level (2015 ), Pre-DM, anemia, CKD3, smoker, Hx Gastric bypass.   Patient denies shortness of breath, fever, cough and chest pain at PAT appointment.  Patient verbalized understanding and agreement to the Pre-Surgical  Instructions that were given to them at this PAT appointment. Patient was also educated of the need to review these PAT instructions again prior to her surgery.I reviewed the appropriate phone numbers to call if they have any and questions or concerns.

## 2022-06-01 ENCOUNTER — Encounter (HOSPITAL_COMMUNITY)
Admission: RE | Admit: 2022-06-01 | Discharge: 2022-06-01 | Disposition: A | Discharge status: Home / Self Care | Payer: 59 | Source: Ambulatory Visit | Attending: Orthopedic Surgery | Admitting: Orthopedic Surgery

## 2022-06-01 ENCOUNTER — Other Ambulatory Visit: Payer: Self-pay

## 2022-06-01 ENCOUNTER — Encounter (HOSPITAL_COMMUNITY): Payer: Self-pay

## 2022-06-01 VITALS — BP 113/69 | HR 68 | Temp 98.8°F | Resp 16 | Ht 65.5 in | Wt 222.0 lb

## 2022-06-01 DIAGNOSIS — R7303 Prediabetes: Secondary | ICD-10-CM | POA: Insufficient documentation

## 2022-06-01 DIAGNOSIS — Z01818 Encounter for other preprocedural examination: Secondary | ICD-10-CM | POA: Insufficient documentation

## 2022-06-01 DIAGNOSIS — I10 Essential (primary) hypertension: Secondary | ICD-10-CM | POA: Insufficient documentation

## 2022-06-01 LAB — COMPREHENSIVE METABOLIC PANEL
ALT: 14 U/L (ref 0–44)
AST: 18 U/L (ref 15–41)
Albumin: 3.9 g/dL (ref 3.5–5.0)
Alkaline Phosphatase: 98 U/L (ref 38–126)
Anion gap: 9 (ref 5–15)
BUN: 21 mg/dL (ref 8–23)
CO2: 24 mmol/L (ref 22–32)
Calcium: 9.3 mg/dL (ref 8.9–10.3)
Chloride: 104 mmol/L (ref 98–111)
Creatinine, Ser: 0.79 mg/dL (ref 0.44–1.00)
GFR, Estimated: >60 mL/min (ref >60)
Glucose, Bld: 86 mg/dL (ref 70–99)
Potassium: 4.3 mmol/L (ref 3.5–5.1)
Sodium: 137 mmol/L (ref 135–145)
Total Bilirubin: 0.8 mg/dL (ref 0.3–1.2)
Total Protein: 7.1 g/dL (ref 6.5–8.1)

## 2022-06-01 LAB — CBC
HCT: 38.6 % (ref 36.0–46.0)
Hemoglobin: 11.9 g/dL — ABNORMAL LOW (ref 12.0–15.0)
MCH: 29.4 pg (ref 26.0–34.0)
MCHC: 30.8 g/dL (ref 30.0–36.0)
MCV: 95.3 fL (ref 80.0–100.0)
Platelets: 219 10*3/uL (ref 150–400)
RBC: 4.05 MIL/uL (ref 3.87–5.11)
RDW: 13.1 % (ref 11.5–15.5)
WBC: 6.4 10*3/uL (ref 4.0–10.5)
nRBC: 0 % (ref 0.0–0.2)

## 2022-06-01 LAB — GLUCOSE, CAPILLARY: Glucose-Capillary: 100 mg/dL — ABNORMAL HIGH (ref 70–99)

## 2022-06-01 LAB — SURGICAL PCR SCREEN
MRSA, PCR: NEGATIVE
Staphylococcus aureus: NEGATIVE

## 2022-06-02 LAB — HEMOGLOBIN A1C
Hgb A1c MFr Bld: 5.2 % (ref 4.8–5.6)
Mean Plasma Glucose: 103 mg/dL

## 2022-06-08 ENCOUNTER — Other Ambulatory Visit: Payer: Self-pay | Admitting: Internal Medicine

## 2022-06-10 ENCOUNTER — Other Ambulatory Visit: Payer: Self-pay | Admitting: Internal Medicine

## 2022-06-11 ENCOUNTER — Ambulatory Visit (INDEPENDENT_AMBULATORY_CARE_PROVIDER_SITE_OTHER): Payer: 59 | Admitting: Radiology

## 2022-06-11 ENCOUNTER — Ambulatory Visit: Payer: 59 | Admitting: Podiatry

## 2022-06-11 ENCOUNTER — Encounter: Payer: Self-pay | Admitting: Podiatry

## 2022-06-11 ENCOUNTER — Ambulatory Visit (INDEPENDENT_AMBULATORY_CARE_PROVIDER_SITE_OTHER): Payer: 59 | Admitting: Podiatry

## 2022-06-11 DIAGNOSIS — N1832 Chronic kidney disease, stage 3b: Secondary | ICD-10-CM | POA: Diagnosis not present

## 2022-06-11 DIAGNOSIS — Q828 Other specified congenital malformations of skin: Secondary | ICD-10-CM

## 2022-06-11 DIAGNOSIS — E538 Deficiency of other specified B group vitamins: Secondary | ICD-10-CM | POA: Diagnosis not present

## 2022-06-11 MED ORDER — CYANOCOBALAMIN 1000 MCG/ML IJ SOLN
1000.0000 ug | Freq: Once | INTRAMUSCULAR | Status: AC
  Administered 2022-06-11: 1000 ug via INTRAMUSCULAR

## 2022-06-11 NOTE — Progress Notes (Signed)
Patient here for b12 injection. Pt tolerated well

## 2022-06-11 NOTE — Progress Notes (Signed)
This patient presents to the office with chief complaint of painful second toe left foot. She says this toe is very painful and the callus is present on the tip of her second toenail left foot as well as second toe right foot.  She also has painful callus right forefoot. She presents to the office for preventative foot care services for nail care.  Patient also has painful callus under the ball of her right foot.   General Appearance  Alert, conversant and in no acute stress.  Vascular  Dorsalis pedis and posterior tibial  pulses are palpable  bilaterally.  Capillary return is within normal limits  bilaterally. Temperature is within normal limits  bilaterally.  Neurologic  Senn-Weinstein monofilament wire test within normal limits  bilaterally. Muscle power within normal limits bilaterally.  Nails Thick disfigured discolored nails with subungual debris  thick hallux nails  bilaterally. No evidence of bacterial infection or drainage bilaterally.  Orthopedic  No limitations of motion  feet .  No crepitus or effusions noted.  HAV  B/L.  Hammer toes 2-5  B/L especially  second toes both feet.  Fused IPJ right hallux.  Skin  normotropic skin noted bilaterally.  No signs of infections or ulcers noted.  Porokeratosis right forefoot.    Porokeratosis right foot.  Hammer toes  B/L.  Debride porokeratosis with # 15 blade. with # 15 blade. She is scheduled for knee surgery in near future.   Gardiner Barefoot DPM

## 2022-06-13 NOTE — Anesthesia Preprocedure Evaluation (Signed)
Anesthesia Evaluation  Patient identified by MRN, date of birth, ID band Patient awake    Reviewed: Allergy & Precautions, NPO status , Patient's Chart, lab work & pertinent test results  Airway Mallampati: II  TM Distance: >3 FB Neck ROM: Full    Dental no notable dental hx.    Pulmonary Current Smoker and Patient abstained from smoking.   Pulmonary exam normal        Cardiovascular hypertension, Pt. on medications  Rhythm:Regular Rate:Normal     Neuro/Psych    Depression       GI/Hepatic Neg liver ROS,GERD  ,,  Endo/Other    Morbid obesity  Renal/GU   negative genitourinary   Musculoskeletal  (+) Arthritis , Osteoarthritis,    Abdominal Normal abdominal exam  (+)   Peds  Hematology  (+) Blood dyscrasia, anemia   Anesthesia Other Findings   Reproductive/Obstetrics                             Anesthesia Physical Anesthesia Plan  ASA: 3  Anesthesia Plan: MAC, Regional and Spinal   Post-op Pain Management: Regional block*   Induction: Intravenous  PONV Risk Score and Plan: 1 and Ondansetron, Dexamethasone, Propofol infusion, Midazolam and Treatment may vary due to age or medical condition  Airway Management Planned: Simple Face Mask and Nasal Cannula  Additional Equipment: None  Intra-op Plan:   Post-operative Plan:   Informed Consent: I have reviewed the patients History and Physical, chart, labs and discussed the procedure including the risks, benefits and alternatives for the proposed anesthesia with the patient or authorized representative who has indicated his/her understanding and acceptance.     Dental advisory given  Plan Discussed with: CRNA  Anesthesia Plan Comments:        Anesthesia Quick Evaluation

## 2022-06-14 ENCOUNTER — Other Ambulatory Visit: Payer: Self-pay

## 2022-06-14 ENCOUNTER — Encounter (HOSPITAL_COMMUNITY)
Admission: RE | Disposition: A | Discharge status: Home / Self Care | Payer: Self-pay | Source: Ambulatory Visit | Attending: Orthopedic Surgery

## 2022-06-14 ENCOUNTER — Ambulatory Visit (HOSPITAL_COMMUNITY): Payer: 59

## 2022-06-14 ENCOUNTER — Ambulatory Visit (HOSPITAL_BASED_OUTPATIENT_CLINIC_OR_DEPARTMENT_OTHER): Payer: 59 | Admitting: Anesthesiology

## 2022-06-14 ENCOUNTER — Encounter (HOSPITAL_COMMUNITY): Payer: Self-pay | Admitting: Orthopedic Surgery

## 2022-06-14 ENCOUNTER — Ambulatory Visit (HOSPITAL_COMMUNITY): Payer: 59 | Admitting: Anesthesiology

## 2022-06-14 ENCOUNTER — Ambulatory Visit (HOSPITAL_COMMUNITY)
Admission: RE | Admit: 2022-06-14 | Discharge: 2022-06-15 | Disposition: A | Discharge status: Home / Self Care | Payer: 59 | Source: Ambulatory Visit | Attending: Orthopedic Surgery | Admitting: Orthopedic Surgery

## 2022-06-14 DIAGNOSIS — Z6836 Body mass index (BMI) 36.0-36.9, adult: Secondary | ICD-10-CM | POA: Diagnosis not present

## 2022-06-14 DIAGNOSIS — I10 Essential (primary) hypertension: Secondary | ICD-10-CM | POA: Diagnosis not present

## 2022-06-14 DIAGNOSIS — F17200 Nicotine dependence, unspecified, uncomplicated: Secondary | ICD-10-CM | POA: Diagnosis not present

## 2022-06-14 DIAGNOSIS — K219 Gastro-esophageal reflux disease without esophagitis: Secondary | ICD-10-CM | POA: Diagnosis not present

## 2022-06-14 DIAGNOSIS — D509 Iron deficiency anemia, unspecified: Secondary | ICD-10-CM | POA: Diagnosis not present

## 2022-06-14 DIAGNOSIS — D649 Anemia, unspecified: Secondary | ICD-10-CM | POA: Diagnosis not present

## 2022-06-14 DIAGNOSIS — G8918 Other acute postprocedural pain: Secondary | ICD-10-CM | POA: Diagnosis not present

## 2022-06-14 DIAGNOSIS — F17210 Nicotine dependence, cigarettes, uncomplicated: Secondary | ICD-10-CM | POA: Insufficient documentation

## 2022-06-14 DIAGNOSIS — R7303 Prediabetes: Secondary | ICD-10-CM

## 2022-06-14 DIAGNOSIS — Z471 Aftercare following joint replacement surgery: Secondary | ICD-10-CM | POA: Diagnosis not present

## 2022-06-14 DIAGNOSIS — M1711 Unilateral primary osteoarthritis, right knee: Secondary | ICD-10-CM | POA: Diagnosis present

## 2022-06-14 DIAGNOSIS — Z96651 Presence of right artificial knee joint: Secondary | ICD-10-CM | POA: Diagnosis not present

## 2022-06-14 HISTORY — PX: KNEE ARTHROPLASTY: SHX992

## 2022-06-14 LAB — GLUCOSE, CAPILLARY: Glucose-Capillary: 95 mg/dL (ref 70–99)

## 2022-06-14 SURGERY — ARTHROPLASTY, KNEE, TOTAL, USING IMAGELESS COMPUTER-ASSISTED NAVIGATION
Anesthesia: Monitor Anesthesia Care | Site: Knee | Laterality: Right

## 2022-06-14 MED ORDER — FERROUS GLUCONATE 324 (38 FE) MG PO TABS
324.0000 mg | ORAL_TABLET | Freq: Every day | ORAL | Status: DC
  Administered 2022-06-15: 324 mg via ORAL
  Filled 2022-06-14: qty 1

## 2022-06-14 MED ORDER — TRANEXAMIC ACID-NACL 1000-0.7 MG/100ML-% IV SOLN
1000.0000 mg | INTRAVENOUS | Status: AC
  Administered 2022-06-14: 1000 mg via INTRAVENOUS
  Filled 2022-06-14: qty 100

## 2022-06-14 MED ORDER — NALOXONE HCL 4 MG/0.1ML NA LIQD: 1.0000 | NASAL | Status: DC | PRN

## 2022-06-14 MED ORDER — METHOCARBAMOL 500 MG IVPB - SIMPLE MED: 500.0000 mg | Freq: Four times a day (QID) | INTRAVENOUS | Status: DC | PRN

## 2022-06-14 MED ORDER — METOCLOPRAMIDE HCL 5 MG/ML IJ SOLN: 5.0000 mg | Freq: Three times a day (TID) | INTRAMUSCULAR | Status: DC | PRN

## 2022-06-14 MED ORDER — ONDANSETRON HCL 4 MG/2ML IJ SOLN
INTRAMUSCULAR | Status: DC | PRN
  Administered 2022-06-14: 4 mg via INTRAVENOUS

## 2022-06-14 MED ORDER — SODIUM CHLORIDE (PF) 0.9 % IJ SOLN
INTRAMUSCULAR | Status: AC
  Filled 2022-06-14: qty 50

## 2022-06-14 MED ORDER — PROPOFOL 500 MG/50ML IV EMUL
INTRAVENOUS | Status: DC | PRN
  Administered 2022-06-14: 100 ug/kg/min via INTRAVENOUS

## 2022-06-14 MED ORDER — DOCUSATE SODIUM 100 MG PO CAPS
100.0000 mg | ORAL_CAPSULE | Freq: Two times a day (BID) | ORAL | Status: DC
  Administered 2022-06-14 – 2022-06-15 (×3): 100 mg via ORAL
  Filled 2022-06-14 (×3): qty 1

## 2022-06-14 MED ORDER — ROPIVACAINE HCL 7.5 MG/ML IJ SOLN
INTRAMUSCULAR | Status: DC | PRN
  Administered 2022-06-14: 20 mL via PERINEURAL

## 2022-06-14 MED ORDER — STERILE WATER FOR IRRIGATION IR SOLN
Status: DC | PRN
  Administered 2022-06-14: 2000 mL

## 2022-06-14 MED ORDER — METHOCARBAMOL 500 MG PO TABS
500.0000 mg | ORAL_TABLET | Freq: Four times a day (QID) | ORAL | Status: DC | PRN
  Administered 2022-06-14 – 2022-06-15 (×4): 500 mg via ORAL
  Filled 2022-06-14 (×5): qty 1

## 2022-06-14 MED ORDER — ASPIRIN 81 MG PO CHEW
81.0000 mg | CHEWABLE_TABLET | Freq: Two times a day (BID) | ORAL | Status: DC
  Administered 2022-06-14 – 2022-06-15 (×2): 81 mg via ORAL
  Filled 2022-06-14 (×2): qty 1

## 2022-06-14 MED ORDER — LACTATED RINGERS IV SOLN: INTRAVENOUS | Status: DC

## 2022-06-14 MED ORDER — NALOXONE HCL 4 MG/0.1ML NA LIQD
1.0000 | NASAL | Status: DC | PRN
  Filled 2022-06-14: qty 8

## 2022-06-14 MED ORDER — MENTHOL 3 MG MT LOZG: 1.0000 | LOZENGE | OROMUCOSAL | Status: DC | PRN

## 2022-06-14 MED ORDER — ACETAMINOPHEN 500 MG PO TABS
1000.0000 mg | ORAL_TABLET | Freq: Once | ORAL | Status: AC
  Administered 2022-06-14: 1000 mg via ORAL
  Filled 2022-06-14: qty 2

## 2022-06-14 MED ORDER — ISOPROPYL ALCOHOL 70 % SOLN
Status: DC | PRN
  Administered 2022-06-14: 1 via TOPICAL

## 2022-06-14 MED ORDER — OXYCODONE HCL 5 MG PO TABS
10.0000 mg | ORAL_TABLET | ORAL | Status: DC | PRN
  Administered 2022-06-14 – 2022-06-15 (×5): 15 mg via ORAL
  Filled 2022-06-14 (×2): qty 3
  Filled 2022-06-14: qty 2
  Filled 2022-06-14 (×2): qty 3

## 2022-06-14 MED ORDER — OXYCODONE HCL ER 20 MG PO T12A
20.0000 mg | EXTENDED_RELEASE_TABLET | Freq: Two times a day (BID) | ORAL | Status: DC
  Administered 2022-06-14 – 2022-06-15 (×2): 20 mg via ORAL
  Filled 2022-06-14 (×2): qty 1

## 2022-06-14 MED ORDER — SODIUM CHLORIDE 0.9 % IR SOLN
Status: DC | PRN
  Administered 2022-06-14: 3000 mL

## 2022-06-14 MED ORDER — FENTANYL CITRATE PF 50 MCG/ML IJ SOSY
50.0000 ug | PREFILLED_SYRINGE | Freq: Once | INTRAMUSCULAR | Status: AC
  Administered 2022-06-14: 50 ug via INTRAVENOUS
  Filled 2022-06-14: qty 2

## 2022-06-14 MED ORDER — PROPOFOL 10 MG/ML IV BOLUS
INTRAVENOUS | Status: DC | PRN
  Administered 2022-06-14: 30 mg via INTRAVENOUS
  Administered 2022-06-14: 10 mg via INTRAVENOUS

## 2022-06-14 MED ORDER — BUPIVACAINE IN DEXTROSE 0.75-8.25 % IT SOLN
INTRATHECAL | Status: DC | PRN
  Administered 2022-06-14: 1.6 mL via INTRATHECAL

## 2022-06-14 MED ORDER — KETOROLAC TROMETHAMINE 30 MG/ML IJ SOLN
INTRAMUSCULAR | Status: DC | PRN
  Administered 2022-06-14: 30 mg

## 2022-06-14 MED ORDER — ONDANSETRON HCL 4 MG PO TABS: 4.0000 mg | ORAL_TABLET | Freq: Four times a day (QID) | ORAL | Status: DC | PRN

## 2022-06-14 MED ORDER — VITAMIN D 25 MCG (1000 UNIT) PO TABS
2000.0000 [IU] | ORAL_TABLET | Freq: Every day | ORAL | Status: DC
  Administered 2022-06-14 – 2022-06-15 (×2): 2000 [IU] via ORAL
  Filled 2022-06-14 (×2): qty 2

## 2022-06-14 MED ORDER — ATORVASTATIN CALCIUM 20 MG PO TABS
20.0000 mg | ORAL_TABLET | Freq: Every day | ORAL | Status: DC
  Administered 2022-06-14 – 2022-06-15 (×2): 20 mg via ORAL
  Filled 2022-06-14 (×2): qty 1

## 2022-06-14 MED ORDER — ALUM & MAG HYDROXIDE-SIMETH 200-200-20 MG/5ML PO SUSP
30.0000 mL | ORAL | Status: DC | PRN
  Administered 2022-06-14: 30 mL via ORAL
  Filled 2022-06-14: qty 30

## 2022-06-14 MED ORDER — PHENYLEPHRINE HCL-NACL 20-0.9 MG/250ML-% IV SOLN
INTRAVENOUS | Status: DC | PRN
  Administered 2022-06-14: 25 ug/min via INTRAVENOUS

## 2022-06-14 MED ORDER — MIDAZOLAM HCL 2 MG/2ML IJ SOLN
1.0000 mg | Freq: Once | INTRAMUSCULAR | Status: AC
  Administered 2022-06-14: 1 mg via INTRAVENOUS
  Filled 2022-06-14: qty 2

## 2022-06-14 MED ORDER — CYANOCOBALAMIN 1000 MCG/ML IJ SOLN: 1000.0000 ug | INTRAMUSCULAR | Status: DC

## 2022-06-14 MED ORDER — GABAPENTIN 300 MG PO CAPS
900.0000 mg | ORAL_CAPSULE | Freq: Three times a day (TID) | ORAL | Status: DC
  Administered 2022-06-14 – 2022-06-15 (×3): 900 mg via ORAL
  Filled 2022-06-14 (×3): qty 3

## 2022-06-14 MED ORDER — BUPIVACAINE HCL 0.25 % IJ SOLN
INTRAMUSCULAR | Status: AC
  Filled 2022-06-14: qty 1

## 2022-06-14 MED ORDER — ORAL CARE MOUTH RINSE: 15.0000 mL | Freq: Once | OROMUCOSAL | Status: AC

## 2022-06-14 MED ORDER — PROPOFOL 1000 MG/100ML IV EMUL
INTRAVENOUS | Status: AC
  Filled 2022-06-14: qty 100

## 2022-06-14 MED ORDER — CICLOPIROX 8 % EX SOLN: Freq: Every day | CUTANEOUS | Status: DC

## 2022-06-14 MED ORDER — OXYCODONE HCL 5 MG PO TABS
5.0000 mg | ORAL_TABLET | ORAL | Status: DC | PRN
  Filled 2022-06-14: qty 1

## 2022-06-14 MED ORDER — PHENOL 1.4 % MT LIQD: 1.0000 | OROMUCOSAL | Status: DC | PRN

## 2022-06-14 MED ORDER — HYDROMORPHONE HCL 1 MG/ML IJ SOLN
0.5000 mg | INTRAMUSCULAR | Status: DC | PRN
  Administered 2022-06-14 – 2022-06-15 (×4): 1 mg via INTRAVENOUS
  Filled 2022-06-14 (×4): qty 1

## 2022-06-14 MED ORDER — DIPHENHYDRAMINE HCL 12.5 MG/5ML PO ELIX
12.5000 mg | ORAL_SOLUTION | ORAL | Status: DC | PRN
  Administered 2022-06-14: 12.5 mg via ORAL
  Filled 2022-06-14: qty 5

## 2022-06-14 MED ORDER — KETOROLAC TROMETHAMINE 30 MG/ML IJ SOLN
INTRAMUSCULAR | Status: AC
  Filled 2022-06-14: qty 1

## 2022-06-14 MED ORDER — SODIUM CHLORIDE 0.9 % IV SOLN: INTRAVENOUS | Status: DC

## 2022-06-14 MED ORDER — SODIUM CHLORIDE (PF) 0.9 % IJ SOLN
INTRAMUSCULAR | Status: DC | PRN
  Administered 2022-06-14: 30 mL via INTRAVENOUS

## 2022-06-14 MED ORDER — ACETAMINOPHEN 325 MG PO TABS
325.0000 mg | ORAL_TABLET | Freq: Four times a day (QID) | ORAL | Status: DC | PRN
  Administered 2022-06-15: 650 mg via ORAL
  Filled 2022-06-14: qty 2

## 2022-06-14 MED ORDER — AMLODIPINE BESYLATE 5 MG PO TABS
2.5000 mg | ORAL_TABLET | Freq: Every day | ORAL | Status: DC
  Filled 2022-06-14: qty 1

## 2022-06-14 MED ORDER — ONDANSETRON HCL 4 MG/2ML IJ SOLN
INTRAMUSCULAR | Status: AC
  Filled 2022-06-14: qty 2

## 2022-06-14 MED ORDER — CHLORHEXIDINE GLUCONATE 0.12 % MT SOLN
15.0000 mL | Freq: Once | OROMUCOSAL | Status: AC
  Administered 2022-06-14: 15 mL via OROMUCOSAL

## 2022-06-14 MED ORDER — ACETAMINOPHEN 500 MG PO TABS
1000.0000 mg | ORAL_TABLET | Freq: Four times a day (QID) | ORAL | Status: AC
  Administered 2022-06-14 – 2022-06-15 (×3): 1000 mg via ORAL
  Filled 2022-06-14 (×3): qty 2

## 2022-06-14 MED ORDER — CEFAZOLIN SODIUM-DEXTROSE 2-4 GM/100ML-% IV SOLN
2.0000 g | Freq: Four times a day (QID) | INTRAVENOUS | Status: AC
  Administered 2022-06-14 (×2): 2 g via INTRAVENOUS
  Filled 2022-06-14 (×2): qty 100

## 2022-06-14 MED ORDER — POLYETHYLENE GLYCOL 3350 17 G PO PACK: 17.0000 g | PACK | Freq: Every day | ORAL | Status: DC | PRN

## 2022-06-14 MED ORDER — ONDANSETRON HCL 4 MG/2ML IJ SOLN: 4.0000 mg | Freq: Four times a day (QID) | INTRAMUSCULAR | Status: DC | PRN

## 2022-06-14 MED ORDER — POVIDONE-IODINE 10 % EX SWAB: 2.0000 | Freq: Once | CUTANEOUS | Status: DC

## 2022-06-14 MED ORDER — SENNA 8.6 MG PO TABS
1.0000 | ORAL_TABLET | Freq: Two times a day (BID) | ORAL | Status: DC
  Administered 2022-06-14 – 2022-06-15 (×3): 8.6 mg via ORAL
  Filled 2022-06-14 (×3): qty 1

## 2022-06-14 MED ORDER — FENTANYL CITRATE PF 50 MCG/ML IJ SOSY: 25.0000 ug | PREFILLED_SYRINGE | INTRAMUSCULAR | Status: DC | PRN

## 2022-06-14 MED ORDER — CEFAZOLIN SODIUM-DEXTROSE 2-4 GM/100ML-% IV SOLN
2.0000 g | INTRAVENOUS | Status: AC
  Administered 2022-06-14: 2 g via INTRAVENOUS
  Filled 2022-06-14: qty 100

## 2022-06-14 MED ORDER — METOCLOPRAMIDE HCL 5 MG PO TABS: 5.0000 mg | ORAL_TABLET | Freq: Three times a day (TID) | ORAL | Status: DC | PRN

## 2022-06-14 MED ORDER — POVIDONE-IODINE 10 % EX SWAB
2.0000 | Freq: Once | CUTANEOUS | Status: AC
  Administered 2022-06-14: 2 via TOPICAL

## 2022-06-14 MED ORDER — BUPIVACAINE-EPINEPHRINE 0.25% -1:200000 IJ SOLN
INTRAMUSCULAR | Status: DC | PRN
  Administered 2022-06-14: 30 mL

## 2022-06-14 SURGICAL SUPPLY — 74 items
ADH SKN CLS APL DERMABOND .7 (GAUZE/BANDAGES/DRESSINGS) ×1
APL PRP STRL LF DISP 70% ISPRP (MISCELLANEOUS) ×2
BAG COUNTER SPONGE SURGICOUNT (BAG) IMPLANT
BAG SPEC THK2 15X12 ZIP CLS (MISCELLANEOUS)
BAG SPNG CNTER NS LX DISP (BAG)
BAG ZIPLOCK 12X15 (MISCELLANEOUS) IMPLANT
BATTERY INSTRU NAVIGATION (MISCELLANEOUS) ×3 IMPLANT
BLADE SAW RECIPROCATING 77.5 (BLADE) ×1 IMPLANT
BNDG CMPR 5X62 HK CLSR LF (GAUZE/BANDAGES/DRESSINGS) ×1
BNDG CMPR MED 10X6 ELC LF (GAUZE/BANDAGES/DRESSINGS) ×1
BNDG ELASTIC 4X5.8 VLCR STR LF (GAUZE/BANDAGES/DRESSINGS) ×1 IMPLANT
BNDG ELASTIC 6INX 5YD STR LF (GAUZE/BANDAGES/DRESSINGS) ×1 IMPLANT
BNDG ELASTIC 6X10 VLCR STRL LF (GAUZE/BANDAGES/DRESSINGS) IMPLANT
BTRY SRG DRVR LF (MISCELLANEOUS) ×3
CHLORAPREP W/TINT 26 (MISCELLANEOUS) ×2 IMPLANT
COMP PATELLAR 10X35 METAL (Joint) ×1 IMPLANT
COMPONENT PATELLAR 10X35 METAL (Joint) IMPLANT
COVER SURGICAL LIGHT HANDLE (MISCELLANEOUS) ×1 IMPLANT
DERMABOND ADVANCED .7 DNX12 (GAUZE/BANDAGES/DRESSINGS) ×2 IMPLANT
DRAPE SHEET LG 3/4 BI-LAMINATE (DRAPES) ×3 IMPLANT
DRAPE U-SHAPE 47X51 STRL (DRAPES) ×1 IMPLANT
DRSG AQUACEL AG ADV 3.5X10 (GAUZE/BANDAGES/DRESSINGS) ×1 IMPLANT
ELECT BLADE TIP CTD 4 INCH (ELECTRODE) ×1 IMPLANT
ELECT REM PT RETURN 15FT ADLT (MISCELLANEOUS) ×1 IMPLANT
GAUZE SPONGE 4X4 12PLY STRL (GAUZE/BANDAGES/DRESSINGS) ×1 IMPLANT
GLOVE BIO SURGEON STRL SZ7 (GLOVE) ×1 IMPLANT
GLOVE BIO SURGEON STRL SZ8.5 (GLOVE) ×2 IMPLANT
GLOVE BIOGEL PI IND STRL 7.5 (GLOVE) ×1 IMPLANT
GLOVE BIOGEL PI IND STRL 8.5 (GLOVE) ×1 IMPLANT
GOWN SPEC L3 XXLG W/TWL (GOWN DISPOSABLE) ×1 IMPLANT
GOWN STRL REUS W/ TWL XL LVL3 (GOWN DISPOSABLE) ×1 IMPLANT
GOWN STRL REUS W/TWL XL LVL3 (GOWN DISPOSABLE) ×1
HANDPIECE INTERPULSE COAX TIP (DISPOSABLE) ×1
HDLS TROCR DRIL PIN KNEE 75 (PIN) ×1
HOLDER FOLEY CATH W/STRAP (MISCELLANEOUS) ×1 IMPLANT
HOOD PEEL AWAY T7 (MISCELLANEOUS) ×3 IMPLANT
INSERT TIB KNEE EF/3-11 12 RT (Insert) IMPLANT
INSRT TIB PS KNE EF/3-11 12 RT (Insert) ×1 IMPLANT
KIT TURNOVER KIT A (KITS) IMPLANT
MARKER SKIN DUAL TIP RULER LAB (MISCELLANEOUS) ×1 IMPLANT
NDL SAFETY ECLIP 18X1.5 (MISCELLANEOUS) ×1 IMPLANT
NDL SPNL 18GX3.5 QUINCKE PK (NEEDLE) ×1 IMPLANT
NEEDLE SPNL 18GX3.5 QUINCKE PK (NEEDLE) ×1 IMPLANT
NS IRRIG 1000ML POUR BTL (IV SOLUTION) ×1 IMPLANT
PACK TOTAL KNEE CUSTOM (KITS) ×1 IMPLANT
PADDING CAST COTTON 6X4 STRL (CAST SUPPLIES) ×1 IMPLANT
PIN DRILL HDLS TROCAR 75 4PK (PIN) IMPLANT
PROS FEM KNEE PS STD 9 RT (Joint) ×1 IMPLANT
PROS TIB KNEE PS 0D F RT (Joint) ×1 IMPLANT
PROSTHESIS FEM KNEE PS STD9 RT (Joint) IMPLANT
PROSTHESIS TIB KNEE PS 0D F RT (Joint) IMPLANT
PROTECTOR NERVE ULNAR (MISCELLANEOUS) ×1 IMPLANT
SAW OSC TIP CART 19.5X105X1.3 (SAW) ×1 IMPLANT
SCREW FEMALE HEX FIX 25X2.5 (ORTHOPEDIC DISPOSABLE SUPPLIES) IMPLANT
SEALER BIPOLAR AQUA 6.0 (INSTRUMENTS) ×1 IMPLANT
SET HNDPC FAN SPRY TIP SCT (DISPOSABLE) ×1 IMPLANT
SET PAD KNEE POSITIONER (MISCELLANEOUS) ×1 IMPLANT
SOLUTION PRONTOSAN WOUND 350ML (IRRIGATION / IRRIGATOR) IMPLANT
SPIKE FLUID TRANSFER (MISCELLANEOUS) ×2 IMPLANT
SUT MNCRL AB 3-0 PS2 18 (SUTURE) ×1 IMPLANT
SUT MNCRL AB 4-0 PS2 18 (SUTURE) IMPLANT
SUT MON AB 2-0 CT1 36 (SUTURE) ×1 IMPLANT
SUT STRATAFIX PDO 1 14 VIOLET (SUTURE) ×1
SUT STRATFX PDO 1 14 VIOLET (SUTURE) ×1
SUT VIC AB 1 CTX 36 (SUTURE) ×2
SUT VIC AB 1 CTX36XBRD ANBCTR (SUTURE) ×2 IMPLANT
SUT VIC AB 2-0 CT1 27 (SUTURE) ×1
SUT VIC AB 2-0 CT1 TAPERPNT 27 (SUTURE) ×1 IMPLANT
SUTURE STRATFX PDO 1 14 VIOLET (SUTURE) ×1 IMPLANT
SYR 3ML LL SCALE MARK (SYRINGE) ×1 IMPLANT
TRAY FOLEY MTR SLVR 16FR STAT (SET/KITS/TRAYS/PACK) IMPLANT
TUBE SUCTION HIGH CAP CLEAR NV (SUCTIONS) ×1 IMPLANT
WATER STERILE IRR 1000ML POUR (IV SOLUTION) ×2 IMPLANT
WRAP KNEE MAXI GEL POST OP (GAUZE/BANDAGES/DRESSINGS) IMPLANT

## 2022-06-14 NOTE — Op Note (Signed)
OPERATIVE REPORT  SURGEON: Rod Can, MD   ASSISTANT: Larene Pickett, PA-C  PREOPERATIVE DIAGNOSIS: Primary Right knee arthritis.   POSTOPERATIVE DIAGNOSIS: Primary Right knee arthritis.   PROCEDURE: Computer assisted Right total knee arthroplasty.   IMPLANTS: Zimmer Persona PPS Cementless CR femur, size 9. Persona 0 degree Spiked Keel OsseoTi Tibia, size F. Vivacit-E polyethelyene insert, size 12 mm, CR. TM standard patella, size 35 mm.  ANESTHESIA:  MAC, Regional, and Spinal  TOURNIQUET TIME: Not utilized.   ESTIMATED BLOOD LOSS:-150 mL    ANTIBIOTICS: 2g Ancef.  DRAINS: None.  COMPLICATIONS: None   CONDITION: PACU - hemodynamically stable.   BRIEF CLINICAL NOTE: Misty [name] is a 62 y.o. female with a long-standing history of Right knee arthritis. After failing conservative management, the patient was indicated for total knee arthroplasty. The risks, benefits, and alternatives to the procedure were explained, and the patient elected to proceed.  PROCEDURE IN DETAIL: Adductor canal block was obtained in the pre-op holding area. Once inside the operative room, spinal anesthesia was obtained, and a foley catheter was inserted. The patient was then positioned and the lower extremity was prepped and draped in the normal sterile surgical fashion.  A time-out was called verifying side and site of surgery. The patient received IV antibiotics within 60 minutes of beginning the procedure. A tourniquet was not utilized.   An anterior approach to the knee was performed utilizing a midvastus arthrotomy. A medial release was performed and the patellar fat pad was excised. Stryker imageless navigation was used to cut the distal femur perpendicular to the mechanical axis. A freehand patellar resection was performed, and the patella was sized an prepared with a lug hole.  Nagivation was used to make a neutral proximal tibia resection, taking 8 mm of bone from the less affected lateral  side with 3 degrees of slope. The menisci were excised. A spacer block was placed, and the alignment and balance in extension were confirmed.   The distal femur was sized using the 3-degree external rotation guide referencing the posterior femoral cortex. The appropriate 4-in-1 cutting block was pinned into place. Rotation was checked using Whiteside's line, the epicondylar axis, and then confirmed with a spacer block in flexion. The remaining femoral cuts were performed, taking care to protect the MCL.  The tibia was sized and the trial tray was pinned into place. The remaining trail components were inserted. The knee was stable to varus and valgus stress through a full range of motion. The patella tracked centrally, and the PCL was well balanced. The trial components were removed, and the proximal tibial surface was prepared. Final components were impacted into place. The knee was tested for a final time and found to be well balanced.   The wound was copiously irrigated with Prontosan solution and normal saline using pulse lavage.  Marcaine solution was injected into the periarticular soft tissue.  The wound was closed in layers using #1 Vicryl and Stratafix for the fascia, 2-0 Vicryl for the subcutaneous fat, 2-0 Monocryl for the deep dermal layer, 3-0 running Monocryl subcuticular Stitch, and 4-0 Monocryl stay sutures at both ends of the wound. Dermabond was applied to the skin.  Once the glue was fully dried, an Aquacell Ag and compressive dressing were applied.  The patient was transported to the recovery room in stable condition.  Sponge, needle, and instrument counts were correct at the end of the case x2.  The patient tolerated the procedure well and there were no known complications.  Please note that a surgical assistant was a medical necessity for this procedure in order to perform it in a safe and expeditious manner. Surgical assistant was necessary to retract the ligaments and vital  neurovascular structures to prevent injury to them and also necessary for proper positioning of the limb to allow for anatomic placement of the prosthesis.

## 2022-06-14 NOTE — Transfer of Care (Signed)
Immediate Anesthesia Transfer of Care Note  Patient: Misty [name]  Procedure(s) Performed: COMPUTER ASSISTED TOTAL KNEE ARTHROPLASTY (Right: Knee)  Patient Location: PACU  Anesthesia Type:Spinal  Level of Consciousness: awake, alert , and oriented  Airway & Oxygen Therapy: Patient Spontanous Breathing and Patient connected to face mask oxygen  Post-op Assessment: Report given to RN and Post -op Vital signs reviewed and stable  Post vital signs: Reviewed and stable  Last Vitals:  Vitals Value Taken Time  BP 101/51 06/14/22 1222  Temp    Pulse 85 06/14/22 1223  Resp 10 06/14/22 1223  SpO2 99 % 06/14/22 1223  Vitals shown include unvalidated device data.  Last Pain:  Vitals:   06/14/22 0855  TempSrc:   PainSc: 0-No pain      Patients Stated Pain Goal: 4 ([id number])  Complications: No notable events documented.

## 2022-06-14 NOTE — Progress Notes (Signed)
Physical Therapy Treatment Patient Details Name: Misty [name] MRN: [id number] DOB: [birth year] Today's Date: 06/14/2022   History of Present Illness Pt s/p R TKR and with hx of bil THR, MS, cervical fusion, colon CA, and gastric bypass    PT Comments    Pt requesting back to bed and assisted from chair to ambulate short distance and returned to supine with ice packs in place and all needs met.  Will follow in am.   Recommendations for follow up therapy are one component of a multi-disciplinary discharge planning process, led by the attending physician.  Recommendations may be updated based on patient status, additional functional criteria and insurance authorization.  Follow Up Recommendations       Assistance Recommended at Discharge Intermittent Supervision/Assistance  Patient can return home with the following A little help with walking and/or transfers;A little help with bathing/dressing/bathroom;Assistance with cooking/housework;Help with stairs or ramp for entrance;Assist for transportation   Equipment Recommendations  None recommended by PT    Recommendations for Other Services       Precautions / Restrictions Precautions Precautions: Fall Restrictions Weight Bearing Restrictions: No Other Position/Activity Restrictions: WBAT     Mobility  Bed Mobility Overal bed mobility: Needs Assistance Bed Mobility: Sit to Supine     Supine to sit: Min assist, Mod assist Sit to supine: Min assist, Mod assist   General bed mobility comments: Increased time with use of bedrail and cues for sequence and use of L LE to self assist    Transfers Overall transfer level: Needs assistance Equipment used: Rolling walker (2 wheels) Transfers: Sit to/from Stand, Bed to chair/wheelchair/BSC Sit to Stand: Min assist, Mod assist   Step pivot transfers: Min assist, Mod assist       General transfer comment: cues for LE management and use of UEs to self assist     Ambulation/Gait Ambulation/Gait assistance: Min assist, +2 safety/equipment Gait Distance (Feet): 5 Feet Assistive device: Rolling walker (2 wheels) Gait Pattern/deviations: Step-to pattern, Decreased step length - right, Decreased step length - left, Shuffle, Trunk flexed Gait velocity: decr     General Gait Details: cues for sequence, posture and position from [employer]             Wheelchair Mobility    Modified Rankin (Stroke Patients Only)       Balance Overall balance assessment: Needs assistance Sitting-balance support: No upper extremity supported, Feet supported Sitting balance-Leahy Scale: Fair     Standing balance support: Bilateral upper extremity supported Standing balance-Leahy Scale: Poor                              Cognition Arousal/Alertness: Awake/alert Behavior During Therapy: WFL for tasks assessed/performed Overall Cognitive Status: Within Functional Limits for tasks assessed                                          Exercises Total Joint Exercises Ankle Circles/Pumps: AROM, Both, 15 reps, Supine    General Comments        Pertinent Vitals/Pain Pain Assessment Pain Assessment: 0-10 Pain Score: 7  Faces Pain Scale: Hurts even more Pain Location: R knee and ankle Pain Descriptors / Indicators: Aching, Sore Pain Intervention(s): Monitored during session, Limited activity within patient's tolerance, Premedicated before session    Home Living Family/patient expects to be discharged  to:: Private residence Living Arrangements: Alone Available Help at Discharge: Family;Available PRN/intermittently Type of Home: House Home Access: Stairs to enter Entrance Stairs-Rails: Right Entrance Stairs-Number of Steps: 4   Home Layout: One level Home Equipment: Conservation officer, nature (2 wheels);Cane - single point;Rollator (4 wheels);Toilet riser      Prior Function            PT Goals (current goals can now be  found in the care plan section) Acute Rehab PT Goals Patient Stated Goal: REgain IND PT Goal Formulation: With patient Time For Goal Achievement: 06/21/22 Potential to Achieve Goals: Good Progress towards PT goals: Progressing toward goals    Frequency    7X/week      PT Plan Current plan remains appropriate    Co-evaluation              AM-PAC PT "6 Clicks" Mobility   Outcome Measure  Help needed turning from your back to your side while in a flat bed without using bedrails?: A Lot Help needed moving from lying on your back to sitting on the side of a flat bed without using bedrails?: A Lot Help needed moving to and from a bed to a chair (including a wheelchair)?: A Lot Help needed standing up from a chair using your arms (e.g., wheelchair or bedside chair)?: A Lot Help needed to walk in hospital room?: A Little Help needed climbing 3-5 steps with a railing? : Total 6 Click Score: 12    End of Session Equipment Utilized During Treatment: Gait belt Activity Tolerance: Patient limited by fatigue Patient left: in bed;with call bell/phone within reach;with family/visitor present;with bed alarm set Nurse Communication: Mobility status PT Visit Diagnosis: Unsteadiness on feet (R26.81);Difficulty in walking, not elsewhere classified (R26.2)     Time: [id number] PT Time Calculation (min) (ACUTE ONLY): 12 min  Charges:  $Gait Training: 8-22 mins $Therapeutic Activity: 8-22 mins                     Debe Coder PT Acute Rehabilitation Services Pager [phone] Office [phone]    Misty [name] 06/14/2022, 5:10 PM

## 2022-06-14 NOTE — Care Plan (Signed)
Ortho Bundle Case Management Note  Patient Details  Name: Misty [name] MRN: [id number] Date of Birth: 1960-08-19                  R TKA on 06-14-22  DCP: Home with dtrs  DME: No needs, has a RW  PT: EO on 06-20-22   DME Arranged:  N/A DME Agency:       Additional Comments: Please contact me with any questions of if this plan should need to change.  Marianne Sofia, RN,CCM [facility]  [phone] 06/14/2022, 1:41 PM

## 2022-06-14 NOTE — Anesthesia Procedure Notes (Signed)
Spinal  Patient location during procedure: OR Start time: 06/14/2022 9:32 AM End time: 06/14/2022 9:35 AM Staffing Performed: anesthesiologist  Anesthesiologist: Darral Dash, DO Performed by: Darral Dash, DO Authorized by: Darral Dash, DO   Preanesthetic Checklist Completed: patient identified, IV checked, site marked, risks and benefits discussed, surgical consent, monitors and equipment checked, pre-op evaluation and timeout performed Spinal Block Patient position: sitting Prep: DuraPrep Patient monitoring: heart rate, cardiac monitor, continuous pulse ox and blood pressure Approach: midline Location: L4-5 Injection technique: single-shot Needle Needle type: Pencan  Needle gauge: 24 G Needle length: 10 cm Assessment Events: CSF return Additional Notes Patient identified. Risks/Benefits/Options discussed with patient including but not limited to bleeding, infection, nerve damage, paralysis, failed block, incomplete pain control, headache, blood pressure changes, nausea, vomiting, reactions to medications, itching and postpartum back pain. Confirmed with bedside nurse the patient's most recent platelet count. Confirmed with patient that they are not currently taking any anticoagulation, have any bleeding history or any family history of bleeding disorders. Patient expressed understanding and wished to proceed. All questions were answered. Sterile technique was used throughout the entire procedure. Please see nursing notes for vital signs. Warning signs of high block given to the patient including shortness of breath, tingling/numbness in hands, complete motor block, or any concerning symptoms with instructions to call for help. Patient was given instructions on fall risk and not to get out of bed. All questions and concerns addressed with instructions to call with any issues or inadequate analgesia.

## 2022-06-14 NOTE — Anesthesia Postprocedure Evaluation (Signed)
Anesthesia Post Note  Patient: [name]  Procedure(s) Performed: COMPUTER ASSISTED TOTAL KNEE ARTHROPLASTY (Right: Knee)     Patient location during evaluation: PACU Anesthesia Type: Regional, Spinal and MAC Level of consciousness: awake and alert Pain management: pain level controlled Vital Signs Assessment: post-procedure vital signs reviewed and stable Respiratory status: spontaneous breathing, nonlabored ventilation, respiratory function stable and patient connected to nasal cannula oxygen Cardiovascular status: stable and blood pressure returned to baseline Postop Assessment: no apparent nausea or vomiting Anesthetic complications: no   No notable events documented.  Last Vitals:  Vitals:   06/14/22 1315 06/14/22 1328  BP: 123/75 127/76  Pulse: 68 (!) 59  Resp: 14   Temp:  (!) 36.4 C  SpO2: 97% 99%    Last Pain:  Vitals:   06/14/22 1328  TempSrc: Oral  PainSc: 0-No pain    LLE Motor Response: Purposeful movement (06/14/22 1335) LLE Sensation: Tingling (06/14/22 1335) RLE Motor Response: Purposeful movement (06/14/22 1335) RLE Sensation: Tingling (06/14/22 1335)      Belenda Cruise P Mervil Wacker

## 2022-06-14 NOTE — Discharge Instructions (Signed)
 Dr. Brian Swinteck Total Joint Specialist [facility] [address] , [place] [address] [phone]  TOTAL KNEE REPLACEMENT POSTOPERATIVE DIRECTIONS    Knee Rehabilitation, Guidelines Following Surgery  Results after knee surgery are often greatly improved when you follow the exercise, range of motion and muscle strengthening exercises prescribed by your doctor. Safety measures are also important to protect the knee from further injury. Any time any of these exercises cause you to have increased pain or swelling in your knee joint, decrease the amount until you are comfortable again and slowly increase them. If you have problems or questions, call your caregiver or physical therapist for advice.   WEIGHT BEARING Weight bearing as tolerated with assist device (walker, cane, etc) as directed, use it as long as suggested by your surgeon or therapist, typically at least 4-6 weeks.  HOME CARE INSTRUCTIONS  Remove items at home which could result in a fall. This includes throw rugs or furniture in walking pathways.  Continue medications as instructed at time of discharge. You may have some home medications which will be placed on hold until you complete the course of blood thinner medication.  You may start showering once you are discharged home but do not submerge the incision under water. Just pat the incision dry and apply a dry gauze dressing on daily. Walk with walker as instructed.  You may resume a sexual relationship in one month or when given the OK by your doctor.  Use walker as long as suggested by your caregivers. Avoid periods of inactivity such as sitting longer than an hour when not asleep. This helps prevent blood clots.  You may put full weight on your legs and walk as much as is comfortable.  You may return to work once you are cleared by your doctor.  Do not drive a car for 6 weeks or until released by you surgeon.  Do not drive while  taking narcotics.  Wear the elastic stockings for three weeks following surgery during the day but you may remove then at night. Make sure you keep all of your appointments after your operation with all of your doctors and caregivers. You should call the office at the above phone number and make an appointment for approximately two weeks after the date of your surgery. Do not remove your surgical dressing. The dressing is waterproof; you may take showers in 3 days, but do not take tub baths or submerge the dressing. Please pick up a stool softener and laxative for home use as long as you are requiring pain medications. ICE to the affected knee every three hours for 30 minutes at a time and then as needed for pain and swelling.  Continue to use ice on the knee for pain and swelling from surgery. You may notice swelling that will progress down to the foot and ankle.  This is normal after surgery.  Elevate the leg when you are not up walking on it.   It is important for you to complete the blood thinner medication as prescribed by your doctor. Continue to use the breathing machine which will help keep your temperature down.  It is common for your temperature to cycle up and down following surgery, especially at night when you are not up moving around and exerting yourself.  The breathing machine keeps your lungs expanded and your temperature down.  RANGE OF MOTION AND STRENGTHENING EXERCISES  Rehabilitation of the knee is important following a knee injury or an   operation. After just a few days of immobilization, the muscles of the thigh which control the knee become weakened and shrink (atrophy). Knee exercises are designed to build up the tone and strength of the thigh muscles and to improve knee motion. Often times heat used for twenty to thirty minutes before working out will loosen up your tissues and help with improving the range of motion but do not use heat for the first two weeks following surgery.  These exercises can be done on a training (exercise) mat, on the floor, on a table or on a bed. Use what ever works the best and is most comfortable for you Knee exercises include:  Leg Lifts - While your knee is still immobilized in a splint or cast, you can do straight leg raises. Lift the leg to 60 degrees, hold for 3 sec, and slowly lower the leg. Repeat 10-20 times 2-3 times daily. Perform this exercise against resistance later as your knee gets better.  Quad and Hamstring Sets - Tighten up the muscle on the front of the thigh (Quad) and hold for 5-10 sec. Repeat this 10-20 times hourly. Hamstring sets are done by pushing the foot backward against an object and holding for 5-10 sec. Repeat as with quad sets.  A rehabilitation program following serious knee injuries can speed recovery and prevent re-injury in the future due to weakened muscles. Contact your doctor or a physical therapist for more information on knee rehabilitation.   POST-OPERATIVE OPIOID TAPER INSTRUCTIONS: It is important to wean off of your opioid medication as soon as possible. If you do not need pain medication after your surgery it is ok to stop day one. Opioids include: Codeine, Hydrocodone(Norco, Vicodin), Oxycodone(Percocet, oxycontin) and hydromorphone amongst others.  Long term and even short term use of opiods can cause: Increased pain response Dependence Constipation Depression Respiratory depression And more.  Withdrawal symptoms can include Flu like symptoms Nausea, vomiting And more Techniques to manage these symptoms Hydrate well Eat regular healthy meals Stay active Use relaxation techniques(deep breathing, meditating, yoga) Do Not substitute Alcohol to help with tapering If you have been on opioids for less than two weeks and do not have pain than it is ok to stop all together.  Plan to wean off of opioids This plan should start within one week post op of your joint replacement. Maintain the same  interval or time between taking each dose and first decrease the dose.  Cut the total daily intake of opioids by one tablet each day Next start to increase the time between doses. The last dose that should be eliminated is the evening dose.    SKILLED REHAB INSTRUCTIONS: If the patient is transferred to a skilled rehab facility following release from the hospital, a list of the current medications will be sent to the facility for the patient to continue.  When discharged from the skilled rehab facility, please have the facility set up the patient's Home Health Physical Therapy prior to being released. Also, the skilled facility will be responsible for providing the patient with their medications at time of release from the facility to include their pain medication, the muscle relaxants, and their blood thinner medication. If the patient is still at the rehab facility at time of the two week follow up appointment, the skilled rehab facility will also need to assist the patient in arranging follow up appointment in our office and any transportation needs.  MAKE SURE YOU:  Understand these instructions.  Will watch   your condition.  Will get help right away if you are not doing well or get worse.    Pick up stool softner and laxative for home use following surgery while on pain medications. Do NOT remove your dressing. You may shower.  Do not take tub baths or submerge incision under water. May shower starting three days after surgery. Please use a clean towel to pat the incision dry following showers. Continue to use ice for pain and swelling after surgery. Do not use any lotions or creams on the incision until instructed by your surgeon.  

## 2022-06-14 NOTE — Interval H&P Note (Signed)
History and Physical Interval Note:  06/14/2022 7:24 AM  Misty [name]  has presented today for surgery, with the diagnosis of Right knee osteoarthritis.  The various methods of treatment have been discussed with the patient and family. After consideration of risks, benefits and other options for treatment, the patient has consented to  Procedure(s) with comments: [place] (Right) - 160 as a surgical intervention.  The patient's history has been reviewed, patient examined, no change in status, stable for surgery.  I have reviewed the patient's chart and labs.  Questions were answered to the patient's satisfaction.     Hilton Cork Keyvin Rison

## 2022-06-14 NOTE — Evaluation (Signed)
Physical Therapy Evaluation Patient Details Name: Misty [name] MRN: [id number] DOB: 19-Aug-1960 Today's Date: 06/14/2022  History of Present Illness  Pt s/p R TKR and with hx of bil THR, MS, cervical fusion, colon CA, and gastric bypass  Clinical Impression  Pt s/p R TKR and presents with decreased R LE strength/ROM and post op pain limiting functional mobility.  Pt should progress to dc home with family assist and reports first OP PT scheduled for 06/20/22.     Recommendations for follow up therapy are one component of a multi-disciplinary discharge planning process, led by the attending physician.  Recommendations may be updated based on patient status, additional functional criteria and insurance authorization.  Follow Up Recommendations       Assistance Recommended at Discharge Intermittent Supervision/Assistance  Patient can return home with the following  A little help with walking and/or transfers;A little help with bathing/dressing/bathroom;Assistance with cooking/housework;Help with stairs or ramp for entrance;Assist for transportation    Equipment Recommendations None recommended by PT  Recommendations for Other Services       Functional Status Assessment Patient has had a recent decline in their functional status and demonstrates the ability to make significant improvements in function in a reasonable and predictable amount of time.     Precautions / Restrictions Precautions Precautions: Fall Restrictions Weight Bearing Restrictions: No Other Position/Activity Restrictions: WBAT      Mobility  Bed Mobility Overal bed mobility: Needs Assistance Bed Mobility: Supine to Sit     Supine to sit: Min assist, Mod assist     General bed mobility comments: Increased time with use of bedrail and cues for sequence and use of L LE to self assist    Transfers Overall transfer level: Needs assistance Equipment used: Rolling walker (2 wheels) Transfers: Sit to/from  Stand Sit to Stand: Min assist           General transfer comment: cues for LE management and use of UEs to self assist    Ambulation/Gait Ambulation/Gait assistance: Min assist, +2 safety/equipment Gait Distance (Feet): 28 Feet Assistive device: Rolling walker (2 wheels) Gait Pattern/deviations: Step-to pattern, Decreased step length - right, Decreased step length - left, Shuffle, Trunk flexed Gait velocity: decr     General Gait Details: cues for sequence, posture and position from RW - distance ltd by c/o increasing dizziness  Stairs            Wheelchair Mobility    Modified Rankin (Stroke Patients Only)       Balance Overall balance assessment: Needs assistance Sitting-balance support: No upper extremity supported, Feet supported Sitting balance-Leahy Scale: Fair     Standing balance support: Bilateral upper extremity supported Standing balance-Leahy Scale: Poor                               Pertinent Vitals/Pain Pain Assessment Pain Assessment: Faces Faces Pain Scale: Hurts even more Pain Location: R knee Pain Descriptors / Indicators: Aching, Sore Pain Intervention(s): Limited activity within patient's tolerance, Monitored during session, Premedicated before session, Ice applied    Home Living Family/patient expects to be discharged to:: Private residence Living Arrangements: Alone Available Help at Discharge: Family;Available PRN/intermittently Type of Home: House Home Access: Stairs to enter Entrance Stairs-Rails: Right Entrance Stairs-Number of Steps: 4   Home Layout: One level Home Equipment: Conservation officer, nature (2 wheels);Cane - single point;Rollator (4 wheels);Toilet riser      Prior Function Prior Level of Function :  Independent/Modified Independent                     Hand Dominance   Dominant Hand: Right    Extremity/Trunk Assessment   Upper Extremity Assessment Upper Extremity Assessment: Overall WFL for tasks  assessed    Lower Extremity Assessment Lower Extremity Assessment: RLE deficits/detail    Cervical / Trunk Assessment Cervical / Trunk Assessment: Normal  Communication   Communication: No difficulties  Cognition Arousal/Alertness: Awake/alert Behavior During Therapy: WFL for tasks assessed/performed Overall Cognitive Status: Within Functional Limits for tasks assessed                                          General Comments      Exercises Total Joint Exercises Ankle Circles/Pumps: AROM, Both, 15 reps, Supine   Assessment/Plan    PT Assessment Patient needs continued PT services  PT Problem List Decreased strength;Decreased range of motion;Decreased activity tolerance;Decreased balance;Decreased mobility;Decreased knowledge of use of DME;Pain       PT Treatment Interventions DME instruction;Gait training;Stair training;Functional mobility training;Therapeutic activities;Therapeutic exercise;Patient/family education    PT Goals (Current goals can be found in the Care Plan section)  Acute Rehab PT Goals Patient Stated Goal: REgain IND PT Goal Formulation: With patient Time For Goal Achievement: 06/21/22 Potential to Achieve Goals: Good    Frequency 7X/week     Co-evaluation               AM-PAC PT "6 Clicks" Mobility  Outcome Measure Help needed turning from your back to your side while in a flat bed without using bedrails?: A Lot Help needed moving from lying on your back to sitting on the side of a flat bed without using bedrails?: A Lot Help needed moving to and from a bed to a chair (including a wheelchair)?: A Lot Help needed standing up from a chair using your arms (e.g., wheelchair or bedside chair)?: A Little Help needed to walk in hospital room?: A Little Help needed climbing 3-5 steps with a railing? : Total 6 Click Score: 13    End of Session Equipment Utilized During Treatment: Gait belt Activity Tolerance: Patient limited by  fatigue Patient left: in chair;with call bell/phone within reach;with chair alarm set;with family/visitor present Nurse Communication: Mobility status PT Visit Diagnosis: Unsteadiness on feet (R26.81);Difficulty in walking, not elsewhere classified (R26.2)    Time: [id number] PT Time Calculation (min) (ACUTE ONLY): 26 min   Charges:   PT Evaluation $PT Eval Low Complexity: 1 Low PT Treatments $Gait Training: 8-22 mins        Debe Coder PT Acute Rehabilitation Services Pager [phone] Office [phone]   [facility] 06/14/2022, 4:46 PM

## 2022-06-14 NOTE — Anesthesia Procedure Notes (Signed)
Procedure Name: MAC Date/Time: 06/14/2022 9:25 AM  Performed by: Maxwell Caul, CRNAPre-anesthesia Checklist: Patient identified, Emergency Drugs available, Suction available and Patient being monitored Oxygen Delivery Method: Simple face mask

## 2022-06-14 NOTE — Plan of Care (Signed)
  Problem: Education: Goal: Knowledge of the prescribed therapeutic regimen will improve Outcome: Progressing   Problem: Activity: Goal: Ability to avoid complications of mobility impairment will improve Outcome: Progressing   Problem: Pain Management: Goal: Pain level will decrease with appropriate interventions Outcome: Progressing   

## 2022-06-14 NOTE — Anesthesia Procedure Notes (Signed)
Anesthesia Regional Block: Adductor canal block   Pre-Anesthetic Checklist: , timeout performed,  Correct Patient, Correct Site, Correct Laterality,  Correct Procedure, Correct Position, site marked,  Risks and benefits discussed,  Surgical consent,  Pre-op evaluation,  At surgeon's request and post-op pain management  Laterality: Right  Prep: Dura Prep       Needles:  Injection technique: Single-shot  Needle Type: Echogenic Stimulator Needle     Needle Length: 10cm  Needle Gauge: 20     Additional Needles:   Procedures:,,,, ultrasound used (permanent image in chart),,    Narrative:  Start time: 06/14/2022 8:46 AM End time: 06/14/2022 8:52 AM Injection made incrementally with aspirations every 5 mL.  Performed by: Personally  Anesthesiologist: Darral Dash, DO  Additional Notes: Patient identified. Risks/Benefits/Options discussed with patient including but not limited to bleeding, infection, nerve damage, failed block, incomplete pain control. Patient expressed understanding and wished to proceed. All questions were answered. Sterile technique was used throughout the entire procedure. Please see nursing notes for vital signs. Aspirated in 5cc intervals with injection for negative confirmation. Patient was given instructions on fall risk and not to get out of bed. All questions and concerns addressed with instructions to call with any issues or inadequate analgesia.

## 2022-06-15 ENCOUNTER — Encounter (HOSPITAL_COMMUNITY): Payer: Self-pay | Admitting: Orthopedic Surgery

## 2022-06-15 DIAGNOSIS — M1711 Unilateral primary osteoarthritis, right knee: Secondary | ICD-10-CM | POA: Diagnosis not present

## 2022-06-15 DIAGNOSIS — F17210 Nicotine dependence, cigarettes, uncomplicated: Secondary | ICD-10-CM | POA: Diagnosis not present

## 2022-06-15 DIAGNOSIS — I10 Essential (primary) hypertension: Secondary | ICD-10-CM | POA: Diagnosis not present

## 2022-06-15 DIAGNOSIS — K219 Gastro-esophageal reflux disease without esophagitis: Secondary | ICD-10-CM | POA: Diagnosis not present

## 2022-06-15 DIAGNOSIS — D509 Iron deficiency anemia, unspecified: Secondary | ICD-10-CM | POA: Diagnosis not present

## 2022-06-15 LAB — CBC
HCT: 33 % — ABNORMAL LOW (ref 36.0–46.0)
Hemoglobin: 9.8 g/dL — ABNORMAL LOW (ref 12.0–15.0)
MCH: 29.2 pg (ref 26.0–34.0)
MCHC: 29.7 g/dL — ABNORMAL LOW (ref 30.0–36.0)
MCV: 98.2 fL (ref 80.0–100.0)
Platelets: 154 10*3/uL (ref 150–400)
RBC: 3.36 MIL/uL — ABNORMAL LOW (ref 3.87–5.11)
RDW: 13.6 % (ref 11.5–15.5)
WBC: 6.8 10*3/uL (ref 4.0–10.5)
nRBC: 0 % (ref 0.0–0.2)

## 2022-06-15 LAB — BASIC METABOLIC PANEL
Anion gap: 6 (ref 5–15)
BUN: 21 mg/dL (ref 8–23)
CO2: 24 mmol/L (ref 22–32)
Calcium: 8.3 mg/dL — ABNORMAL LOW (ref 8.9–10.3)
Chloride: 103 mmol/L (ref 98–111)
Creatinine, Ser: 0.72 mg/dL (ref 0.44–1.00)
GFR, Estimated: >60 mL/min (ref >60)
Glucose, Bld: 124 mg/dL — ABNORMAL HIGH (ref 70–99)
Potassium: 4.1 mmol/L (ref 3.5–5.1)
Sodium: 133 mmol/L — ABNORMAL LOW (ref 135–145)

## 2022-06-15 MED ORDER — OXYCODONE HCL 5 MG PO TABS
15.0000 mg | ORAL_TABLET | ORAL | Status: DC | PRN
  Administered 2022-06-15: 20 mg via ORAL
  Filled 2022-06-15: qty 4

## 2022-06-15 MED ORDER — OXYCODONE HCL 15 MG PO TABS: 15.0000 mg | ORAL_TABLET | ORAL | 0 refills | Status: DC | PRN

## 2022-06-15 MED ORDER — DOCUSATE SODIUM 100 MG PO CAPS: 100.0000 mg | ORAL_CAPSULE | Freq: Two times a day (BID) | ORAL | 0 refills | Status: AC

## 2022-06-15 MED ORDER — POLYETHYLENE GLYCOL 3350 17 G PO PACK: 17.0000 g | PACK | Freq: Every day | ORAL | 0 refills | Status: AC | PRN

## 2022-06-15 MED ORDER — SENNA 8.6 MG PO TABS: 2.0000 | ORAL_TABLET | Freq: Every day | ORAL | 0 refills | Status: AC

## 2022-06-15 MED ORDER — ASPIRIN 81 MG PO CHEW: 81.0000 mg | CHEWABLE_TABLET | Freq: Two times a day (BID) | ORAL | 0 refills | Status: AC

## 2022-06-15 MED ORDER — ONDANSETRON HCL 4 MG PO TABS: 4.0000 mg | ORAL_TABLET | Freq: Three times a day (TID) | ORAL | 0 refills | Status: DC | PRN

## 2022-06-15 NOTE — Progress Notes (Signed)
Subjective: 1 Day Post-Op Procedure(s) (LRB): COMPUTER ASSISTED TOTAL KNEE ARTHROPLASTY (Right) Patient seen in rounds by Dr. Wynelle Link. Patient is well, and has had no acute complaints or problems other than pain. Denies SOB or chest pain. Denies calf pain. Patient reports pain as severe. Worked with physical therapy yesterday and ambulated 28'. We will continue physical therapy today.  Objective: Vital signs in last 24 hours: Temp:  [97.5 F (36.4 C)-98.7 F (37.1 C)] 98.4 F (36.9 C) (03/29 0804) Pulse Rate:  [59-100] 93 (03/29 0804) Resp:  [10-18] 17 (03/29 0804) BP: (101-160)/(51-90) 140/77 (03/29 0804) SpO2:  [97 %-100 %] 99 % (03/29 0804)  Intake/Output from previous day:  Intake/Output Summary (Last 24 hours) at 06/15/2022 0834 Last data filed at 06/15/2022 [id number] Gross per 24 hour  Intake 5377.27 ml  Output 3200 ml  Net 2177.27 ml     Intake/Output this shift: Total I/O In: -  Out: 650 [Urine:650]  Labs: Recent Labs    06/15/22 0313  HGB 9.8*   Recent Labs    06/15/22 0313  WBC 6.8  RBC 3.36*  HCT 33.0*  PLT 154   Recent Labs    06/15/22 0313  NA 133*  K 4.1  CL 103  CO2 24  BUN 21  CREATININE 0.72  GLUCOSE 124*  CALCIUM 8.3*   No results for input(s): "LABPT", "INR" in the last 72 hours.  Exam: General - Patient is Alert and Oriented Extremity - Neurologically intact Neurovascular intact Sensation intact distally Dorsiflexion/Plantar flexion intact Dressing - dressing C/D/I Motor Function - intact, moving foot and toes well on exam.  Past Medical History:  Diagnosis Date   Allergy    Arthritis    Back pain    Colon cancer (HCC)    Depression    Diarrhea    GERD (gastroesophageal reflux disease)    Hypertension    Insomnia    MS (multiple sclerosis) ([facility]) 2000   pt says that she doesn't have it   Other hammer toe (acquired) 03/31/2013   Pneumonia    hx of   Pre-diabetes    Umbilical hernia     Assessment/Plan: 1 Day  Post-Op Procedure(s) (LRB): COMPUTER ASSISTED TOTAL KNEE ARTHROPLASTY (Right) Principal Problem:   Osteoarthritis of right knee  Estimated body mass index is 36.38 kg/m as calculated from the following:   Height as of this encounter: 5' 5.5" (1.664 m).   Weight as of this encounter: 100.7 kg. Advance diet Up with therapy D/C IV fluids  Patient's anticipated LOS is less than 2 midnights, meeting these requirements: - Younger than 44 - Lives within 1 hour of care - Has a competent adult at home to recover with post-op - NO history of  - Diabetes  - Coronary Artery Disease  - Heart failure  - Heart attack  - Stroke  - DVT/VTE  - Cardiac arrhythmia  - Respiratory Failure/COPD  - Renal failure  - Anemia  - Advanced Liver disease  DVT Prophylaxis - Aspirin Weight bearing as tolerated.  Pain continues to be an issue for patient which was discussed prior to surgery as she was on chronic pain medications. Discussed with primary team and will increase to oxy 20 mg q 4 hrs.  Continue physical therapy. Possible discharge home today pending progress, if meeting patient goals, and if pain managed. I did discuss with patient that it is highly probable that she will require an additional night in hospital for pain control. However, she is highly  motivated to return home.  Rainey Pines, PA-C Orthopedic Surgery 06/15/2022, 8:34 AM

## 2022-06-15 NOTE — Progress Notes (Signed)
Physical Therapy Treatment Patient Details Name: Misty [name] MRN: [id number] DOB: 11-16-1960 Today's Date: 06/15/2022   History of Present Illness Pt s/p R TKR and with hx of bil THR, MS, cervical fusion, colon CA, and gastric bypass    PT Comments    Pt very cooperative and progressing well with mobility.  Pt up to ambulate increased distance, up to bathroom, balance improved, no c/o dizziness, and HEP initiated.  Pt hopeful for dc home this date.   Recommendations for follow up therapy are one component of a multi-disciplinary discharge planning process, led by the attending physician.  Recommendations may be updated based on patient status, additional functional criteria and insurance authorization.  Follow Up Recommendations       Assistance Recommended at Discharge Intermittent Supervision/Assistance  Patient can return home with the following A little help with walking and/or transfers;A little help with bathing/dressing/bathroom;Assistance with cooking/housework;Help with stairs or ramp for entrance;Assist for transportation   Equipment Recommendations  None recommended by PT    Recommendations for Other Services       Precautions / Restrictions Precautions Precautions: Fall Restrictions Weight Bearing Restrictions: No RLE Weight Bearing: Weight bearing as tolerated Other Position/Activity Restrictions: WBAT     Mobility  Bed Mobility Overal bed mobility: Needs Assistance Bed Mobility: Supine to Sit     Supine to sit: Min assist, Min guard     General bed mobility comments: Increased time with use of bedrail and cues for sequence and use of L LE to self assist    Transfers Overall transfer level: Needs assistance Equipment used: Rolling walker (2 wheels) Transfers: Sit to/from Stand Sit to Stand: Min assist           General transfer comment: cues for LE management and use of UEs to self assist    Ambulation/Gait Ambulation/Gait assistance:  Min assist, Min guard Gait Distance (Feet): 100 Feet (and additional 15' into bathroom) Assistive device: Rolling walker (2 wheels) Gait Pattern/deviations: Step-to pattern, Decreased step length - right, Decreased step length - left, Shuffle, Trunk flexed Gait velocity: decr     General Gait Details: cues for sequence, posture and position from [employer]             Wheelchair Mobility    Modified Rankin (Stroke Patients Only)       Balance Overall balance assessment: Needs assistance Sitting-balance support: No upper extremity supported, Feet supported Sitting balance-Leahy Scale: Good     Standing balance support: Single extremity supported Standing balance-Leahy Scale: Fair                              Cognition Arousal/Alertness: Awake/alert Behavior During Therapy: WFL for tasks assessed/performed Overall Cognitive Status: Within Functional Limits for tasks assessed                                          Exercises Total Joint Exercises Ankle Circles/Pumps: AROM, Both, 15 reps, Supine Quad Sets: AROM, Both, 10 reps, Supine Heel Slides: AAROM, Right, 15 reps, Supine Hip ABduction/ADduction: AAROM, Right, 10 reps, Supine Straight Leg Raises: AAROM, Right, 15 reps, Supine    General Comments        Pertinent Vitals/Pain Pain Assessment Pain Assessment: 0-10 Pain Score: 6  Pain Location: R knee and ankle Pain Descriptors / Indicators: Aching, Sore Pain  Intervention(s): Limited activity within patient's tolerance, Monitored during session, Premedicated before session, Ice applied    Home Living                          Prior Function            PT Goals (current goals can now be found in the care plan section) Acute Rehab PT Goals Patient Stated Goal: REgain IND PT Goal Formulation: With patient Time For Goal Achievement: 06/21/22 Potential to Achieve Goals: Good Progress towards PT goals:  Progressing toward goals    Frequency    7X/week      PT Plan Current plan remains appropriate    Co-evaluation              AM-PAC PT "6 Clicks" Mobility   Outcome Measure  Help needed turning from your back to your side while in a flat bed without using bedrails?: A Little Help needed moving from lying on your back to sitting on the side of a flat bed without using bedrails?: A Little Help needed moving to and from a bed to a chair (including a wheelchair)?: A Little Help needed standing up from a chair using your arms (e.g., wheelchair or bedside chair)?: A Little Help needed to walk in hospital room?: A Little Help needed climbing 3-5 steps with a railing? : A Lot 6 Click Score: 17    End of Session Equipment Utilized During Treatment: Gait belt Activity Tolerance: Patient tolerated treatment well Patient left: in chair;with call bell/phone within reach;with chair alarm set Nurse Communication: Mobility status PT Visit Diagnosis: Unsteadiness on feet (R26.81);Difficulty in walking, not elsewhere classified (R26.2)     Time: [id number] PT Time Calculation (min) (ACUTE ONLY): 41 min  Charges:  $Gait Training: 8-22 mins $Therapeutic Exercise: 8-22 mins $Therapeutic Activity: 8-22 mins                     Debe Coder PT Acute Rehabilitation Services Pager [phone] Office [phone]    Brad Mcgaughy 06/15/2022, 1:24 PM

## 2022-06-15 NOTE — Progress Notes (Signed)
Physical Therapy Treatment Patient Details Name: Misty [name] MRN: [id number] DOB: 30-May-1960 Today's Date: 06/15/2022   History of Present Illness Pt s/p R TKR and with hx of bil THR, MS, cervical fusion, colon CA, and gastric bypass    PT Comments    Pt in good spirits and progressing well with mobility.  Pt up to ambulate in hall, negotiated stairs, and reviewed written HEP.  Pt eager for dc home this date.   Recommendations for follow up therapy are one component of a multi-disciplinary discharge planning process, led by the attending physician.  Recommendations may be updated based on patient status, additional functional criteria and insurance authorization.  Follow Up Recommendations       Assistance Recommended at Discharge Intermittent Supervision/Assistance  Patient can return home with the following A little help with walking and/or transfers;A little help with bathing/dressing/bathroom;Assistance with cooking/housework;Help with stairs or ramp for entrance;Assist for transportation   Equipment Recommendations  None recommended by PT    Recommendations for Other Services       Precautions / Restrictions Precautions Precautions: Fall Restrictions Weight Bearing Restrictions: No RLE Weight Bearing: Weight bearing as tolerated Other Position/Activity Restrictions: WBAT     Mobility  Bed Mobility Overal bed mobility: Needs Assistance Bed Mobility: Supine to Sit     Supine to sit: Min assist, Min guard     General bed mobility comments: Up in chair and requests back to same    Transfers Overall transfer level: Needs assistance Equipment used: Rolling walker (2 wheels) Transfers: Sit to/from Stand Sit to Stand: Min guard           General transfer comment: cues for LE management and use of UEs to self assist    Ambulation/Gait Ambulation/Gait assistance: Min guard, Supervision Gait Distance (Feet): 100 Feet Assistive device: Rolling walker (2  wheels) Gait Pattern/deviations: Step-to pattern, Decreased step length - right, Decreased step length - left, Shuffle, Trunk flexed Gait velocity: decr     General Gait Details: cues for sequence, posture and position from RW   Stairs Stairs: Yes Stairs assistance: Min assist Stair Management: One rail Left, Step to pattern, Forwards, With cane Number of Stairs: 7 General stair comments: 4+3 stairs with rail, cane and cues for sequence   Wheelchair Mobility    Modified Rankin (Stroke Patients Only)       Balance Overall balance assessment: Needs assistance Sitting-balance support: No upper extremity supported, Feet supported Sitting balance-Leahy Scale: Good     Standing balance support: Single extremity supported Standing balance-Leahy Scale: Fair                              Cognition Arousal/Alertness: Awake/alert Behavior During Therapy: WFL for tasks assessed/performed Overall Cognitive Status: Within Functional Limits for tasks assessed                                          Exercises Total Joint Exercises Ankle Circles/Pumps: AROM, Both, 15 reps, Supine Quad Sets: AROM, Both, 10 reps, Supine Heel Slides: AAROM, Right, 15 reps, Supine Hip ABduction/ADduction: AAROM, Right, 10 reps, Supine Straight Leg Raises: AAROM, Right, 15 reps, Supine    General Comments        Pertinent Vitals/Pain Pain Assessment Pain Assessment: 0-10 Pain Score: 6  Pain Location: R knee and ankle Pain Descriptors / Indicators:  Aching, Sore Pain Intervention(s): Limited activity within patient's tolerance, Monitored during session, Premedicated before session, Ice applied    Home Living                          Prior Function            PT Goals (current goals can now be found in the care plan section) Acute Rehab PT Goals Patient Stated Goal: REgain IND PT Goal Formulation: With patient Time For Goal Achievement:  06/21/22 Potential to Achieve Goals: Good Progress towards PT goals: Progressing toward goals    Frequency    7X/week      PT Plan Current plan remains appropriate    Co-evaluation              AM-PAC PT "6 Clicks" Mobility   Outcome Measure  Help needed turning from your back to your side while in a flat bed without using bedrails?: A Little Help needed moving from lying on your back to sitting on the side of a flat bed without using bedrails?: A Little Help needed moving to and from a bed to a chair (including a wheelchair)?: A Little Help needed standing up from a chair using your arms (e.g., wheelchair or bedside chair)?: A Little Help needed to walk in hospital room?: A Little Help needed climbing 3-5 steps with a railing? : A Little 6 Click Score: 18    End of Session Equipment Utilized During Treatment: Gait belt Activity Tolerance: Patient tolerated treatment well Patient left: in chair;with call bell/phone within reach;with chair alarm set Nurse Communication: Mobility status PT Visit Diagnosis: Unsteadiness on feet (R26.81);Difficulty in walking, not elsewhere classified (R26.2)     Time: [id number] PT Time Calculation (min) (ACUTE ONLY): 37 min  Charges:  $Gait Training: 8-22 mins $Therapeutic Exercise: 8-22 mins $Therapeutic Activity: 8-22 mins                     Debe Coder PT Acute Rehabilitation Services Pager [phone] Office [phone]    Deya Bigos 06/15/2022, 2:42 PM

## 2022-06-15 NOTE — Plan of Care (Signed)

## 2022-06-15 NOTE — TOC Transition Note (Signed)
Transition of Care [facility]) - CM/SW Discharge Note  Patient Details  Name: Misty [name] MRN: [id number] Date of Birth: 1960/12/23  [facility]) CM/SW Contact:  Sherie Don, LCSW Phone Number: 06/15/2022, 10:26 AM  Clinical Narrative: Patient is expected to discharge home after working with PT. CSW met with patient to confirm discharge plan. Patient will go home with OPPT at [facility] with the first appointment scheduled for 06/20/22. Patient has a rolling walker and ice machine at home, so there are no DME needs at this time. TOC signing off.  Final next level of care: OP Rehab Barriers to Discharge: No Barriers Identified  Patient Goals and CMS Choice Choice offered to / list presented to : NA  Discharge Plan and Services Additional resources added to the After Visit Summary for        DME Arranged: N/A DME Agency: NA  Social Determinants of Health (SDOH) Interventions SDOH Screenings   Food Insecurity: No Food Insecurity (08/29/2021)  Housing: Low Risk  (06/14/2022)  Transportation Needs: No Transportation Needs (08/29/2021)  Alcohol Screen: Low Risk  (08/29/2021)  Depression (PHQ2-9): Low Risk  (04/11/2022)  Financial Resource Strain: Low Risk  (08/29/2021)  Physical Activity: Sufficiently Active (08/29/2021)  Recent Concern: Physical Activity - Inactive (08/29/2021)  Social Connections: Moderately Integrated (08/29/2021)  Stress: No Stress Concern Present (08/29/2021)  Tobacco Use: High Risk (06/15/2022)   Readmission Risk Interventions     No data to display

## 2022-06-15 NOTE — Progress Notes (Signed)
Provided discharge education/instructions, all questions and concerns addressed. Pt not in acute distress, discharged home with belongings. 

## 2022-06-18 ENCOUNTER — Ambulatory Visit: Payer: 59 | Admitting: Podiatry

## 2022-06-19 ENCOUNTER — Telehealth: Payer: Self-pay | Admitting: Internal Medicine

## 2022-06-19 MED ORDER — METHOCARBAMOL 500 MG PO TABS: 500.0000 mg | ORAL_TABLET | Freq: Three times a day (TID) | ORAL | 2 refills | Status: DC | PRN

## 2022-06-19 NOTE — Telephone Encounter (Signed)
Alternative sent to [facility] -methocarbamol.

## 2022-06-19 NOTE — Telephone Encounter (Signed)
Patient called and states that the tyzanidine is making her hallucinate and is too strong.  She would like something else sent in.  Pharmacy:  [facility]  Patients number:  [phone]

## 2022-06-20 DIAGNOSIS — M25661 Stiffness of right knee, not elsewhere classified: Secondary | ICD-10-CM | POA: Diagnosis not present

## 2022-06-20 DIAGNOSIS — M25561 Pain in right knee: Secondary | ICD-10-CM | POA: Diagnosis not present

## 2022-06-21 DIAGNOSIS — M545 Low back pain, unspecified: Secondary | ICD-10-CM | POA: Diagnosis not present

## 2022-06-21 DIAGNOSIS — M25551 Pain in right hip: Secondary | ICD-10-CM | POA: Diagnosis not present

## 2022-06-21 DIAGNOSIS — M25561 Pain in right knee: Secondary | ICD-10-CM | POA: Diagnosis not present

## 2022-06-21 DIAGNOSIS — G8929 Other chronic pain: Secondary | ICD-10-CM | POA: Diagnosis not present

## 2022-06-21 DIAGNOSIS — G894 Chronic pain syndrome: Secondary | ICD-10-CM | POA: Diagnosis not present

## 2022-06-22 DIAGNOSIS — M25661 Stiffness of right knee, not elsewhere classified: Secondary | ICD-10-CM | POA: Diagnosis not present

## 2022-06-22 DIAGNOSIS — M25561 Pain in right knee: Secondary | ICD-10-CM | POA: Diagnosis not present

## 2022-06-25 DIAGNOSIS — M25661 Stiffness of right knee, not elsewhere classified: Secondary | ICD-10-CM | POA: Diagnosis not present

## 2022-06-25 DIAGNOSIS — M25561 Pain in right knee: Secondary | ICD-10-CM | POA: Diagnosis not present

## 2022-06-27 DIAGNOSIS — M25661 Stiffness of right knee, not elsewhere classified: Secondary | ICD-10-CM | POA: Diagnosis not present

## 2022-06-27 DIAGNOSIS — M25561 Pain in right knee: Secondary | ICD-10-CM | POA: Diagnosis not present

## 2022-06-29 DIAGNOSIS — M25561 Pain in right knee: Secondary | ICD-10-CM | POA: Diagnosis not present

## 2022-06-29 DIAGNOSIS — M25661 Stiffness of right knee, not elsewhere classified: Secondary | ICD-10-CM | POA: Diagnosis not present

## 2022-07-02 DIAGNOSIS — M25561 Pain in right knee: Secondary | ICD-10-CM | POA: Diagnosis not present

## 2022-07-02 DIAGNOSIS — M25661 Stiffness of right knee, not elsewhere classified: Secondary | ICD-10-CM | POA: Diagnosis not present

## 2022-07-03 DIAGNOSIS — Z96651 Presence of right artificial knee joint: Secondary | ICD-10-CM | POA: Diagnosis not present

## 2022-07-04 DIAGNOSIS — M25561 Pain in right knee: Secondary | ICD-10-CM | POA: Diagnosis not present

## 2022-07-04 DIAGNOSIS — M25661 Stiffness of right knee, not elsewhere classified: Secondary | ICD-10-CM | POA: Diagnosis not present

## 2022-07-06 DIAGNOSIS — M25561 Pain in right knee: Secondary | ICD-10-CM | POA: Diagnosis not present

## 2022-07-06 DIAGNOSIS — M25661 Stiffness of right knee, not elsewhere classified: Secondary | ICD-10-CM | POA: Diagnosis not present

## 2022-07-08 ENCOUNTER — Other Ambulatory Visit: Payer: Self-pay | Admitting: Internal Medicine

## 2022-07-09 DIAGNOSIS — M25561 Pain in right knee: Secondary | ICD-10-CM | POA: Diagnosis not present

## 2022-07-09 DIAGNOSIS — M25661 Stiffness of right knee, not elsewhere classified: Secondary | ICD-10-CM | POA: Diagnosis not present

## 2022-07-11 DIAGNOSIS — Z780 Asymptomatic menopausal state: Secondary | ICD-10-CM | POA: Diagnosis not present

## 2022-07-12 ENCOUNTER — Ambulatory Visit (INDEPENDENT_AMBULATORY_CARE_PROVIDER_SITE_OTHER): Payer: 59

## 2022-07-12 DIAGNOSIS — E538 Deficiency of other specified B group vitamins: Secondary | ICD-10-CM | POA: Diagnosis not present

## 2022-07-12 MED ORDER — CYANOCOBALAMIN 1000 MCG/ML IJ SOLN
1000.0000 ug | Freq: Once | INTRAMUSCULAR | Status: AC
  Administered 2022-07-12: 1000 ug via INTRAMUSCULAR

## 2022-07-12 NOTE — Progress Notes (Signed)
Pt was given B12 injection with no complications. 

## 2022-07-13 DIAGNOSIS — M25661 Stiffness of right knee, not elsewhere classified: Secondary | ICD-10-CM | POA: Diagnosis not present

## 2022-07-13 DIAGNOSIS — M25561 Pain in right knee: Secondary | ICD-10-CM | POA: Diagnosis not present

## 2022-07-16 DIAGNOSIS — M25561 Pain in right knee: Secondary | ICD-10-CM | POA: Diagnosis not present

## 2022-07-16 DIAGNOSIS — M25661 Stiffness of right knee, not elsewhere classified: Secondary | ICD-10-CM | POA: Diagnosis not present

## 2022-07-17 DIAGNOSIS — M25561 Pain in right knee: Secondary | ICD-10-CM | POA: Diagnosis not present

## 2022-07-17 DIAGNOSIS — M25551 Pain in right hip: Secondary | ICD-10-CM | POA: Diagnosis not present

## 2022-07-17 DIAGNOSIS — M545 Low back pain, unspecified: Secondary | ICD-10-CM | POA: Diagnosis not present

## 2022-07-17 DIAGNOSIS — G8929 Other chronic pain: Secondary | ICD-10-CM | POA: Diagnosis not present

## 2022-07-19 DIAGNOSIS — M25661 Stiffness of right knee, not elsewhere classified: Secondary | ICD-10-CM | POA: Diagnosis not present

## 2022-07-19 DIAGNOSIS — M25561 Pain in right knee: Secondary | ICD-10-CM | POA: Diagnosis not present

## 2022-07-23 DIAGNOSIS — M25561 Pain in right knee: Secondary | ICD-10-CM | POA: Diagnosis not present

## 2022-07-23 DIAGNOSIS — M25661 Stiffness of right knee, not elsewhere classified: Secondary | ICD-10-CM | POA: Diagnosis not present

## 2022-07-26 DIAGNOSIS — M25661 Stiffness of right knee, not elsewhere classified: Secondary | ICD-10-CM | POA: Diagnosis not present

## 2022-07-26 DIAGNOSIS — M25561 Pain in right knee: Secondary | ICD-10-CM | POA: Diagnosis not present

## 2022-07-30 DIAGNOSIS — M25661 Stiffness of right knee, not elsewhere classified: Secondary | ICD-10-CM | POA: Diagnosis not present

## 2022-07-30 DIAGNOSIS — M25561 Pain in right knee: Secondary | ICD-10-CM | POA: Diagnosis not present

## 2022-07-31 ENCOUNTER — Other Ambulatory Visit (HOSPITAL_COMMUNITY): Payer: Self-pay | Admitting: Orthopedic Surgery

## 2022-07-31 DIAGNOSIS — Z96641 Presence of right artificial hip joint: Secondary | ICD-10-CM

## 2022-07-31 DIAGNOSIS — Z96651 Presence of right artificial knee joint: Secondary | ICD-10-CM | POA: Diagnosis not present

## 2022-07-31 DIAGNOSIS — Z471 Aftercare following joint replacement surgery: Secondary | ICD-10-CM | POA: Diagnosis not present

## 2022-08-05 ENCOUNTER — Other Ambulatory Visit: Payer: Self-pay | Admitting: Internal Medicine

## 2022-08-06 DIAGNOSIS — M25661 Stiffness of right knee, not elsewhere classified: Secondary | ICD-10-CM | POA: Diagnosis not present

## 2022-08-06 DIAGNOSIS — M25561 Pain in right knee: Secondary | ICD-10-CM | POA: Diagnosis not present

## 2022-08-08 ENCOUNTER — Encounter: Payer: Self-pay | Admitting: Podiatry

## 2022-08-08 ENCOUNTER — Ambulatory Visit (INDEPENDENT_AMBULATORY_CARE_PROVIDER_SITE_OTHER): Payer: 59 | Admitting: Podiatry

## 2022-08-08 ENCOUNTER — Ambulatory Visit: Payer: 59 | Admitting: Podiatry

## 2022-08-08 DIAGNOSIS — N1832 Chronic kidney disease, stage 3b: Secondary | ICD-10-CM | POA: Diagnosis not present

## 2022-08-08 DIAGNOSIS — Q828 Other specified congenital malformations of skin: Secondary | ICD-10-CM | POA: Diagnosis not present

## 2022-08-08 NOTE — Progress Notes (Signed)
This patient presents to the office with chief complaint of painful second toe left foot. She says this toe is very painful and the callus is present on the tip of her second toenail left foot as well as second toe right foot.  She also has painful callus right forefoot. She presents to the office for preventative foot care services for nail care.  Patient also has painful callus under the ball of her right foot.   General Appearance  Alert, conversant and in no acute stress.  Vascular  Dorsalis pedis and posterior tibial  pulses are palpable  bilaterally.  Capillary return is within normal limits  bilaterally. Temperature is within normal limits  bilaterally.  Neurologic  Senn-Weinstein monofilament wire test within normal limits  bilaterally. Muscle power within normal limits bilaterally.  Nails Thick disfigured discolored nails with subungual debris  thick hallux nails  bilaterally. No evidence of bacterial infection or drainage bilaterally.  Orthopedic  No limitations of motion  feet .  No crepitus or effusions noted.  HAV  B/L.  Hammer toes 2-5  B/L especially  second toes both feet.  Fused IPJ right hallux.  Skin  normotropic skin noted bilaterally.  No signs of infections or ulcers noted.  Porokeratosis right forefoot.    Porokeratosis right foot.  Hammer toes  B/L.  Debride porokeratosis with # 15 blade. And dremel tool. with # 15 blade.    Helane Gunther DPM

## 2022-08-09 ENCOUNTER — Ambulatory Visit (HOSPITAL_COMMUNITY)
Admission: RE | Admit: 2022-08-09 | Discharge: 2022-08-09 | Disposition: A | Discharge status: Home / Self Care | Payer: 59 | Source: Ambulatory Visit | Attending: Orthopedic Surgery | Admitting: Orthopedic Surgery

## 2022-08-09 ENCOUNTER — Encounter (HOSPITAL_COMMUNITY)
Admission: RE | Admit: 2022-08-09 | Discharge: 2022-08-09 | Disposition: A | Discharge status: Home / Self Care | Payer: 59 | Source: Ambulatory Visit | Attending: Orthopedic Surgery | Admitting: Orthopedic Surgery

## 2022-08-09 DIAGNOSIS — Z96641 Presence of right artificial hip joint: Secondary | ICD-10-CM | POA: Diagnosis not present

## 2022-08-09 DIAGNOSIS — Z96642 Presence of left artificial hip joint: Secondary | ICD-10-CM | POA: Diagnosis not present

## 2022-08-09 MED ORDER — TECHNETIUM TC 99M MEDRONATE IV KIT
20.0000 | PACK | Freq: Once | INTRAVENOUS | Status: AC | PRN
  Administered 2022-08-09: 20.2 via INTRAVENOUS

## 2022-08-15 ENCOUNTER — Ambulatory Visit (INDEPENDENT_AMBULATORY_CARE_PROVIDER_SITE_OTHER): Payer: 59

## 2022-08-15 DIAGNOSIS — E538 Deficiency of other specified B group vitamins: Secondary | ICD-10-CM | POA: Diagnosis not present

## 2022-08-15 DIAGNOSIS — M25561 Pain in right knee: Secondary | ICD-10-CM | POA: Diagnosis not present

## 2022-08-15 DIAGNOSIS — M25661 Stiffness of right knee, not elsewhere classified: Secondary | ICD-10-CM | POA: Diagnosis not present

## 2022-08-15 MED ORDER — CYANOCOBALAMIN 1000 MCG/ML IJ SOLN
1000.0000 ug | Freq: Once | INTRAMUSCULAR | Status: AC
  Administered 2022-08-15: 1000 ug via INTRAMUSCULAR

## 2022-08-15 NOTE — Progress Notes (Signed)
After obtaining consent, and per orders of Dr. Burns, injection of B12 given by Jermarcus Mcfadyen P Osmani Kersten. Patient instructed to report any adverse reaction to me immediately.  

## 2022-08-17 ENCOUNTER — Other Ambulatory Visit: Payer: Self-pay | Admitting: Internal Medicine

## 2022-08-20 DIAGNOSIS — M545 Low back pain, unspecified: Secondary | ICD-10-CM | POA: Diagnosis not present

## 2022-08-20 DIAGNOSIS — Z96641 Presence of right artificial hip joint: Secondary | ICD-10-CM | POA: Diagnosis not present

## 2022-08-20 DIAGNOSIS — M25551 Pain in right hip: Secondary | ICD-10-CM | POA: Diagnosis not present

## 2022-08-20 DIAGNOSIS — M25661 Stiffness of right knee, not elsewhere classified: Secondary | ICD-10-CM | POA: Diagnosis not present

## 2022-08-20 DIAGNOSIS — Z471 Aftercare following joint replacement surgery: Secondary | ICD-10-CM | POA: Diagnosis not present

## 2022-08-20 DIAGNOSIS — Z79899 Other long term (current) drug therapy: Secondary | ICD-10-CM | POA: Diagnosis not present

## 2022-08-20 DIAGNOSIS — M25561 Pain in right knee: Secondary | ICD-10-CM | POA: Diagnosis not present

## 2022-08-20 DIAGNOSIS — Z96651 Presence of right artificial knee joint: Secondary | ICD-10-CM | POA: Diagnosis not present

## 2022-08-20 DIAGNOSIS — M1611 Unilateral primary osteoarthritis, right hip: Secondary | ICD-10-CM | POA: Diagnosis not present

## 2022-08-20 DIAGNOSIS — G8929 Other chronic pain: Secondary | ICD-10-CM | POA: Diagnosis not present

## 2022-08-23 ENCOUNTER — Ambulatory Visit (INDEPENDENT_AMBULATORY_CARE_PROVIDER_SITE_OTHER): Payer: 59

## 2022-08-23 VITALS — Ht 65.5 in | Wt 225.0 lb

## 2022-08-23 DIAGNOSIS — Z0000 Encounter for general adult medical examination without abnormal findings: Secondary | ICD-10-CM | POA: Diagnosis not present

## 2022-08-23 NOTE — Patient Instructions (Signed)
[name] , Thank you for taking time to come for your Medicare Wellness Visit. I appreciate your ongoing commitment to your health goals. Please review the following plan we discussed and let me know if I can assist you in the future.   These are the goals we discussed:  Goals      Client understands the importance of follow-up with providers by attending scheduled visits.        This is a list of the screening recommended for you and due dates:  Health Maintenance  Topic Date Due   DTaP/Tdap/Td vaccine (1 - Tdap) Never done   Zoster (Shingles) Vaccine (1 of 2) Never done   Colon Cancer Screening  09/09/2019   COVID-19 Vaccine (3 - Pfizer risk series) 09/26/2020   Flu Shot  10/18/2022   Medicare Annual Wellness Visit  08/23/2023   Mammogram  10/27/2023   Hepatitis C Screening  Completed   HIV Screening  Completed   HPV Vaccine  Aged Out    Advanced directives: Yes; daughter is aware of wishes.  Conditions/risks identified: Yes  Next appointment: Follow up in one year for your annual wellness visit.   Preventive Care 61 Years and Older, Female Preventive care refers to lifestyle choices and visits with your health care provider that can promote health and wellness. What does preventive care include? A yearly physical exam. This is also called an annual well check. Dental exams once or twice a year. Routine eye exams. Ask your health care provider how often you should have your eyes checked. Personal lifestyle choices, including: Daily care of your teeth and gums. Regular physical activity. Eating a healthy diet. Avoiding tobacco and drug use. Limiting alcohol use. Practicing safe sex. Taking low-dose aspirin every day. Taking vitamin and mineral supplements as recommended by your health care provider. What happens during an annual well check? The services and screenings done by your health care provider during your annual well check will depend on your age, overall  health, lifestyle risk factors, and family history of disease. Counseling  Your health care provider may ask you questions about your: Alcohol use. Tobacco use. Drug use. Emotional well-being. Home and relationship well-being. Sexual activity. Eating habits. History of falls. Memory and ability to understand (cognition). Work and work Astronomer. Reproductive health. Screening  You may have the following tests or measurements: Height, weight, and BMI. Blood pressure. Lipid and cholesterol levels. These may be checked every 5 years, or more frequently if you are over 66 years old. Skin check. Lung cancer screening. You may have this screening every year starting at age 55 if you have a 30-pack-year history of smoking and currently smoke or have quit within the past 15 years. Fecal occult blood test (FOBT) of the stool. You may have this test every year starting at age 57. Flexible sigmoidoscopy or colonoscopy. You may have a sigmoidoscopy every 5 years or a colonoscopy every 10 years starting at age 69. Hepatitis C blood test. Hepatitis B blood test. Sexually transmitted disease (STD) testing. Diabetes screening. This is done by checking your blood sugar (glucose) after you have not eaten for a while (fasting). You may have this done every 1-3 years. Bone density scan. This is done to screen for osteoporosis. You may have this done starting at age 72. Mammogram. This may be done every 1-2 years. Talk to your health care provider about how often you should have regular mammograms. Talk with your health care provider about your test results,  treatment options, and if necessary, the need for more tests. Vaccines  Your health care provider may recommend certain vaccines, such as: Influenza vaccine. This is recommended every year. Tetanus, diphtheria, and acellular pertussis (Tdap, Td) vaccine. You may need a Td booster every 10 years. Zoster vaccine. You may need this after age  18. Pneumococcal 13-valent conjugate (PCV13) vaccine. One dose is recommended after age 40. Pneumococcal polysaccharide (PPSV23) vaccine. One dose is recommended after age 46. Talk to your health care provider about which screenings and vaccines you need and how often you need them. This information is not intended to replace advice given to you by your health care provider. Make sure you discuss any questions you have with your health care provider. Document Released: 04/01/2015 Document Revised: 11/23/2015 Document Reviewed: 01/04/2015 Elsevier Interactive Patient Education  2017 [employer].  Fall Prevention in the Home Falls can cause injuries. They can happen to people of all ages. There are many things you can do to make your home safe and to help prevent falls. What can I do on the outside of my home? Regularly fix the edges of walkways and driveways and fix any cracks. Remove anything that might make you trip as you walk through a door, such as a raised step or threshold. Trim any bushes or trees on the path to your home. Use bright outdoor lighting. Clear any walking paths of anything that might make someone trip, such as rocks or tools. Regularly check to see if handrails are loose or broken. Make sure that both sides of any steps have handrails. Any raised decks and porches should have guardrails on the edges. Have any leaves, snow, or ice cleared regularly. Use sand or salt on walking paths during winter. Clean up any spills in your garage right away. This includes oil or grease spills. What can I do in the bathroom? Use night lights. Install grab bars by the toilet and in the tub and shower. Do not use towel bars as grab bars. Use non-skid mats or decals in the tub or shower. If you need to sit down in the shower, use a plastic, non-slip stool. Keep the floor dry. Clean up any water that spills on the floor as soon as it happens. Remove soap buildup in the tub or shower  regularly. Attach bath mats securely with double-sided non-slip rug tape. Do not have throw rugs and other things on the floor that can make you trip. What can I do in the bedroom? Use night lights. Make sure that you have a light by your bed that is easy to reach. Do not use any sheets or blankets that are too big for your bed. They should not hang down onto the floor. Have a firm chair that has side arms. You can use this for support while you get dressed. Do not have throw rugs and other things on the floor that can make you trip. What can I do in the kitchen? Clean up any spills right away. Avoid walking on wet floors. Keep items that you use a lot in easy-to-reach places. If you need to reach something above you, use a strong step stool that has a grab bar. Keep electrical cords out of the way. Do not use floor polish or wax that makes floors slippery. If you must use wax, use non-skid floor wax. Do not have throw rugs and other things on the floor that can make you trip. What can I do with my stairs? Do  not leave any items on the stairs. Make sure that there are handrails on both sides of the stairs and use them. Fix handrails that are broken or loose. Make sure that handrails are as long as the stairways. Check any carpeting to make sure that it is firmly attached to the stairs. Fix any carpet that is loose or worn. Avoid having throw rugs at the top or bottom of the stairs. If you do have throw rugs, attach them to the floor with carpet tape. Make sure that you have a light switch at the top of the stairs and the bottom of the stairs. If you do not have them, ask someone to add them for you. What else can I do to help prevent falls? Wear shoes that: Do not have high heels. Have rubber bottoms. Are comfortable and fit you well. Are closed at the toe. Do not wear sandals. If you use a stepladder: Make sure that it is fully opened. Do not climb a closed stepladder. Make sure that  both sides of the stepladder are locked into place. Ask someone to hold it for you, if possible. Clearly mark and make sure that you can see: Any grab bars or handrails. First and last steps. Where the edge of each step is. Use tools that help you move around (mobility aids) if they are needed. These include: Canes. Walkers. Scooters. Crutches. Turn on the lights when you go into a dark area. Replace any light bulbs as soon as they burn out. Set up your furniture so you have a clear path. Avoid moving your furniture around. If any of your floors are uneven, fix them. If there are any pets around you, be aware of where they are. Review your medicines with your doctor. Some medicines can make you feel dizzy. This can increase your chance of falling. Ask your doctor what other things that you can do to help prevent falls. This information is not intended to replace advice given to you by your health care provider. Make sure you discuss any questions you have with your health care provider. Document Released: 12/30/2008 Document Revised: 08/11/2015 Document Reviewed: 04/09/2014 Elsevier Interactive Patient Education  2017 [employer].

## 2022-08-23 NOTE — Progress Notes (Addendum)
I connected with  Misty [name] on 08/23/22 by a audio enabled telemedicine application and verified that I am speaking with the correct person using two identifiers.  Patient Location: Home  Provider Location: Office/Clinic  I discussed the limitations of evaluation and management by telemedicine. The patient expressed understanding and agreed to proceed.  Patient Medicare AWV questionnaire was completed by the patient on 08/19/2022; I have confirmed that all information answered by patient is correct and no changes since this date.     Subjective:   Misty [name] is a 62 y.o. female who presents for Medicare Annual (Subsequent) preventive examination.  Review of Systems     Cardiac Risk Factors include: advanced age (>20men, >29 women);hypertension;family history of premature cardiovascular disease;obesity (BMI >30kg/m2);sedentary lifestyle     Objective:    Today's Vitals   08/23/22 1505 08/23/22 1507  Weight: 225 lb (102.1 kg)   Height: 5' 5.5" (1.664 m)   PainSc: 0-No pain 0-No pain   Body mass index is 36.87 kg/m.     08/23/2022    3:11 PM 06/14/2022    1:00 PM 06/14/2022    7:28 AM 06/01/2022   11:42 AM 10/11/2021    8:57 PM 08/29/2021   12:08 PM 03/05/2021    4:47 PM  Advanced Directives  Does Patient Have a Medical Advance Directive? Yes No No No No Yes No  Type of Estate agent of [place];Living will     Living will;Healthcare Power of Attorney   Does patient want to make changes to medical advance directive?      No - Patient declined   Copy of Healthcare Power of Attorney in Chart? No - copy requested     No - copy requested   Would patient like information on creating a medical advance directive?   No - Patient declined No - Patient declined       Current Medications (verified) Outpatient Encounter Medications as of 08/23/2022  Medication Sig   albuterol (PROAIR HFA) 108 (90 Base) MCG/ACT inhaler Inhale 1-2 puffs into the lungs every  6 (six) hours as needed for wheezing or shortness of breath. (Patient not taking: Reported on 06/01/2022)   amLODipine (NORVASC) 2.5 MG tablet Take 2.5 mg by mouth daily.   atorvastatin (LIPITOR) 20 MG tablet TAKE 1 TABLET BY MOUTH ONCE DAILY . APPOINTMENT REQUIRED FOR FUTURE REFILLS   Cholecalciferol (VITAMIN D3) 50 MCG (2000 UT) TABS TAKE 1 TABLET BY MOUTH EVERY DAY   ciclopirox (PENLAC) 8 % solution Apply topically at bedtime. Apply over nail and surrounding skin. Apply daily over previous coat. After seven (7) days, may remove with alcohol and continue cycle.   cyanocobalamin (,VITAMIN B-12,) 1000 MCG/ML injection Inject 1,000 mcg into the muscle every 30 (thirty) days.   ferrous gluconate (FERGON) 324 MG tablet Take 1 tablet (324 mg total) by mouth daily.   gabapentin (NEURONTIN) 300 MG capsule TAKE 3 CAPSULES BY MOUTH THREE TIMES DAILY   lisinopril (ZESTRIL) 20 MG tablet Take 1 tablet (20 mg total) by mouth daily.   MELATONIN PO Take 1 tablet by mouth as needed (sleep).   methocarbamol (ROBAXIN) 500 MG tablet Take 1 tablet (500 mg total) by mouth every 8 (eight) hours as needed for muscle spasms.   naloxone (NARCAN) nasal spray 4 mg/0.1 mL Place 1 spray into the nose as needed (opioid overdose).   ondansetron (ZOFRAN) 4 MG tablet Take 1 tablet (4 mg total) by mouth every 8 (eight) hours as needed  for nausea or vomiting.   oxyCODONE (ROXICODONE) 15 MG immediate release tablet Take 1 tablet (15 mg total) by mouth every 4 (four) hours as needed for pain.   OZEMPIC, 1 MG/DOSE, 4 MG/3ML SOPN INJECT 1 MG  AS DIRECTED ONCE A WEEK   Syringe/Needle, Disp, (SYRINGE 3CC/25GX1") 25G X 1" 3 ML MISC Use for monthly B12 injections   Turmeric (QC TUMERIC COMPLEX PO) Take 1 tablet by mouth daily.   XTAMPZA ER 18 MG C12A Take 18 mg by mouth every 12 (twelve) hours.   No facility-administered encounter medications on file as of 08/23/2022.    Allergies (verified) Phenergan [promethazine hcl], Tizanidine, and  Trazodone and nefazodone   History: Past Medical History:  Diagnosis Date   Allergy    Arthritis    Back pain    Colon cancer (HCC)    Depression    Diarrhea    GERD (gastroesophageal reflux disease)    Hypertension    Insomnia    MS (multiple sclerosis) (HCC) 2000   pt says that she doesn't have it   Other hammer toe (acquired) 03/31/2013   Pneumonia    hx of   Pre-diabetes    Umbilical hernia    Past Surgical History:  Procedure Laterality Date   ABDOMINAL HYSTERECTOMY     ANTERIOR CERVICAL DECOMP/DISCECTOMY FUSION N/A 03/01/2014   Procedure: ANTERIOR CERVICAL DECOMPRESSION/DISCECTOMY FUSION 1 LEVEL;  Surgeon: Temple Pacini, MD;  Location: [facility];  Service: Neurosurgery;  Laterality: N/A;  ANTERIOR CERVICAL DECOMPRESSION/DISCECTOMY FUSION 1 LEVEL   BUNIONECTOMY Right 10/14/2014   @[facility]    CESAREAN SECTION     COLON SURGERY     COLONOSCOPY     FOOT SURGERY Right    GASTRIC BYPASS     Hammer Toe Repair Right 10/14/2014   RT #2, @[facility]    INCISION AND DRAINAGE HIP Left 06/02/2020   Procedure: IRRIGATION AND DEBRIDEMENT LEFT HIP, POSSIBLE HEAD BALL AND LINER EXCHANGE;  Surgeon: Samson Frederic, MD;  Location: [facility];  Service: Orthopedics;  Laterality: Left;    KNEE ARTHROPLASTY Right 06/14/2022   Procedure: COMPUTER ASSISTED TOTAL KNEE ARTHROPLASTY;  Surgeon: Samson Frederic, MD;  Location: [facility];  Service: Orthopedics;  Laterality: Right;  160   TENOTOMY Right 10/14/2014   RT #3, @[facility]    TOTAL HIP ARTHROPLASTY Left 04/28/2020   Procedure: TOTAL HIP ARTHROPLASTY ANTERIOR APPROACH;  Surgeon: Samson Frederic, MD;  Location: [facility];  Service: Orthopedics;  Laterality: Left;  3E bed   TOTAL HIP ARTHROPLASTY Right 01/25/2021   Procedure: TOTAL HIP ARTHROPLASTY ANTERIOR APPROACH;  Surgeon: Samson Frederic, MD;  Location: [facility];  Service: Orthopedics;  Laterality: Right;   Family History  Problem Relation Age of Onset   Stroke Mother    Hypertension Mother    Hyperlipidemia  Mother    Diabetes Mother    Diabetes Brother    Hypertension Brother    Hypertension Brother    Hypertension Brother    Hypertension Brother    Hypertension Brother    Alcoholism Brother    Breast cancer Neg Hx    Social History   Socioeconomic History   Marital status: Single    Spouse name: Not on file   Number of children: 2   Years of education: Not on file   Highest education level: Not on file  Occupational History   Occupation: Disability  Tobacco Use   Smoking status: Some Days    Packs/day: 0.25    Years: 20.00  Additional pack years: 0.00    Total pack years: 5.00    Types: Cigarettes    Last attempt to quit: 09/22/2018    Years since quitting: 3.9   Smokeless tobacco: Never   Tobacco comments:    states she quit 3 months ago  Vaping Use   Vaping Use: Never used  Substance and Sexual Activity   Alcohol use: Yes    Alcohol/week: 0.0 standard drinks of alcohol    Comment: social   Drug use: No   Sexual activity: Yes    Partners: Male    Birth control/protection: Surgical  Other Topics Concern   Not on file  Social History Narrative   Not on file   Social Determinants of Health   Financial Resource Strain: Low Risk  (08/23/2022)   Overall Financial Resource Strain (CARDIA)    Difficulty of Paying Living Expenses: Not hard at all  Food Insecurity: No Food Insecurity (08/23/2022)   Hunger Vital Sign    Worried About Running Out of Food in the Last Year: Never true    Ran Out of Food in the Last Year: Never true  Transportation Needs: No Transportation Needs (08/23/2022)   PRAPARE - Administrator, Civil Service (Medical): No    Lack of Transportation (Non-Medical): No  Physical Activity: Insufficiently Active (08/23/2022)   Exercise Vital Sign    Days of Exercise per Week: 2 days    Minutes of Exercise per Session: 20 min  Stress: No Stress Concern Present (08/23/2022)   [employer] of Occupational Health - Occupational Stress  Questionnaire    Feeling of Stress : Not at all  Social Connections: Moderately Isolated (08/23/2022)   Social Connection and Isolation Panel [NHANES]    Frequency of Communication with Friends and Family: More than three times a week    Frequency of Social Gatherings with Friends and Family: Three times a week    Attends Religious Services: More than 4 times per year    Active Member of Clubs or Organizations: No    Attends Banker Meetings: Never    Marital Status: Divorced    Tobacco Counseling Ready to quit: Not Answered Counseling given: Not Answered Tobacco comments: states she quit 3 months ago   Clinical Intake:  Pre-visit preparation completed: Yes  Pain : No/denies pain Pain Score: 0-No pain     BMI - recorded: 36.87 Nutritional Status: BMI > 30  Obese Nutritional Risks: None Diabetes: No  How often do you need to have someone help you when you read instructions, pamphlets, or other written materials from your doctor or pharmacy?: 1 - Never What is the last grade level you completed in school?: HSG  Diabetic? No  Interpreter Needed?: No  Information entered by :: Aruna Nestler N. Yomaris Palecek, LPN.   Activities of Daily Living    08/23/2022    3:12 PM 08/19/2022    2:08 PM  In your present state of health, do you have any difficulty performing the following activities:  Hearing? 0 0  Vision? 0 0  Difficulty concentrating or making decisions? 0 0  Walking or climbing stairs? 1 1  Dressing or bathing? 0 0  Doing errands, shopping? 0 0  Preparing Food and eating ? N N  Using the Toilet? N N  In the past six months, have you accidently leaked urine? N N  Do you have problems with loss of bowel control? N N  Managing your Medications? N N  Managing your  Finances? N N  Housekeeping or managing your Housekeeping? Malvin Johns    Patient Care Team: Pincus Sanes, MD as PCP - General (Internal Medicine) Helane Gunther, DPM as Consulting Physician  (Podiatry) Kathyrn Sheriff, [facility] as Pharmacist (Pharmacist)  Indicate any recent Medical Services you may have received from other than [facility] providers in the past year (date may be approximate).     Assessment:   This is a routine wellness examination for [place].  Hearing/Vision screen Hearing Screening - Comments:: Denies hearing difficulties   Vision Screening - Comments:: Wears rx glasses - up to date with routine eye exams with [facility]   Dietary issues and exercise activities discussed: Current Exercise Habits: Home exercise routine, Type of exercise: walking, Time (Minutes): 20, Frequency (Times/Week): 2, Weekly Exercise (Minutes/Week): 40, Intensity: Mild, Exercise limited by: orthopedic condition(s)   Goals Addressed             This Visit's Progress    Client understands the importance of follow-up with providers by attending scheduled visits.        Depression Screen    08/23/2022    3:10 PM 04/11/2022    3:01 PM 11/30/2021   10:56 AM 11/30/2021   10:17 AM 08/29/2021   12:16 PM 06/29/2020    4:40 PM 02/19/2020    1:45 PM  PHQ 2/9 Scores  PHQ - 2 Score 0 0 0 0 0 2 1  PHQ- 9 Score 0     3 3    Fall Risk    08/23/2022    3:12 PM 08/19/2022    2:08 PM 04/11/2022    3:01 PM 11/30/2021   10:55 AM 11/30/2021   10:16 AM  Fall Risk   Falls in the past year? 0 0 1 1 1   Number falls in past yr: 0 0 0 1 1  Injury with Fall? 0 0 1 1 1   Risk for fall due to : No Fall Risks  History of fall(s) History of fall(s) History of fall(s)  Follow up Falls prevention discussed  Falls evaluation completed Falls evaluation completed Falls evaluation completed    FALL RISK PREVENTION PERTAINING TO THE HOME:  Any stairs in or around the home? No  If so, are there any without handrails? No  Home free of loose throw rugs in walkways, pet beds, electrical cords, etc? Yes  Adequate lighting in your home to reduce risk of falls? Yes   ASSISTIVE DEVICES UTILIZED TO PREVENT  FALLS:  Life alert? No  Use of a cane, walker or w/c? Yes  Grab bars in the bathroom? Yes  Shower chair or bench in shower? Yes  Elevated toilet seat or a handicapped toilet? Yes   TIMED UP AND GO:  Was the test performed? No . Telephonic Visit  Cognitive Function:        08/23/2022    3:14 PM 08/29/2021   12:21 PM  6CIT Screen  What Year? 0 points 0 points  What month? 0 points 0 points  What time? 0 points 0 points  Count back from 20 0 points 0 points  Months in reverse 0 points 0 points  Repeat phrase 0 points 0 points  Total Score 0 points 0 points    Immunizations Immunization History  Administered Date(s) Administered   Influenza Whole 04/14/2010   Influenza,inj,Quad PF,6+ Mos 03/01/2014, 01/18/2016, 04/17/2017, 01/13/2018, 11/25/2018, 01/07/2020, 12/26/2020, 11/30/2021   Influenza-Unspecified 03/16/2015   PFIZER Comirnaty(Gray Top)Covid-19 Tri-Sucrose Vaccine 08/29/2020   PFIZER(Purple Top)SARS-COV-2  Vaccination 02/12/2020   PPD Test 08/06/2017    TDAP status: Declined.  Flu Vaccine status: Up to date  Pneumococcal vaccine status: Declined,  Education has been provided regarding the importance of this vaccine but patient still declined. Advised may receive this vaccine at local pharmacy or Health Dept. Aware to provide a copy of the vaccination record if obtained from local pharmacy or Health Dept. Verbalized acceptance and understanding.   Covid-19 vaccine status: Completed vaccines  Qualifies for Shingles Vaccine? Yes   Zostavax completed No   Shingrix Completed?: No.    Education has been provided regarding the importance of this vaccine. Patient has been advised to call insurance company to determine out of pocket expense if they have not yet received this vaccine. Advised may also receive vaccine at local pharmacy or Health Dept. Verbalized acceptance and understanding.  Screening Tests Health Maintenance  Topic Date Due   DTaP/Tdap/Td (1 - Tdap) Never  done   Zoster Vaccines- Shingrix (1 of 2) Never done   Colonoscopy  09/09/2019   COVID-19 Vaccine (3 - Pfizer risk series) 09/26/2020   INFLUENZA VACCINE  10/18/2022   Medicare Annual Wellness (AWV)  08/23/2023   MAMMOGRAM  10/27/2023   Hepatitis C Screening  Completed   HIV Screening  Completed   HPV VACCINES  Aged Out    Health Maintenance  Health Maintenance Due  Topic Date Due   DTaP/Tdap/Td (1 - Tdap) Never done   Zoster Vaccines- Shingrix (1 of 2) Never done   Colonoscopy  09/09/2019   COVID-19 Vaccine (3 - Pfizer risk series) 09/26/2020    Colorectal cancer screening: Type of screening: Colonoscopy. Completed 01/29/2020. Repeat every 5 years  Mammogram status: Completed 10/26/2021. Repeat every year  Bone Density status: Never done.  Lung Cancer Screening: (Low Dose CT Chest recommended if Age 75-80 years, 30 pack-year currently smoking OR have quit w/in 15years.) does not qualify.   Lung Cancer Screening Referral: no  Additional Screening:  Hepatitis C Screening: does qualify; Completed 01/18/2016  Vision Screening: Recommended annual ophthalmology exams for early detection of glaucoma and other disorders of the eye. Is the patient up to date with their annual eye exam?  Yes  Who is the provider or what is the name of the office in which the patient attends annual eye exams? [facility] If pt is not established with a provider, would they like to be referred to a provider to establish care? No .   Dental Screening: Recommended annual dental exams for proper oral hygiene  Community Resource Referral / Chronic Care Management: CRR required this visit?  No   CCM required this visit?  No      Plan:     I have personally reviewed and noted the following in the patient's chart:   Medical and social history Use of alcohol, tobacco or illicit drugs  Current medications and supplements including opioid prescriptions. Patient is currently taking opioid  prescriptions. Information provided to patient regarding non-opioid alternatives. Patient advised to discuss non-opioid treatment plan with their provider. Functional ability and status Nutritional status Physical activity Advanced directives List of other physicians Hospitalizations, surgeries, and ER visits in previous 12 months Vitals Screenings to include cognitive, depression, and falls Referrals and appointments  In addition, I have reviewed and discussed with patient certain preventive protocols, quality metrics, and best practice recommendations. A written personalized care plan for preventive services as well as general preventive health recommendations were provided to patient.     Percell Miller  Francis Dowse, LPN   03/24/1094   Nurse Notes: Normal cognitive status assessed by direct observation via telephone conversation by this Nurse Health Advisor. No abnormalities found.

## 2022-08-28 DIAGNOSIS — M25551 Pain in right hip: Secondary | ICD-10-CM | POA: Diagnosis not present

## 2022-08-28 DIAGNOSIS — Z96641 Presence of right artificial hip joint: Secondary | ICD-10-CM | POA: Diagnosis not present

## 2022-08-30 DIAGNOSIS — Z96641 Presence of right artificial hip joint: Secondary | ICD-10-CM | POA: Diagnosis not present

## 2022-08-30 DIAGNOSIS — M25551 Pain in right hip: Secondary | ICD-10-CM | POA: Diagnosis not present

## 2022-09-03 DIAGNOSIS — Z96641 Presence of right artificial hip joint: Secondary | ICD-10-CM | POA: Diagnosis not present

## 2022-09-03 DIAGNOSIS — M47816 Spondylosis without myelopathy or radiculopathy, lumbar region: Secondary | ICD-10-CM | POA: Diagnosis not present

## 2022-09-03 DIAGNOSIS — M25551 Pain in right hip: Secondary | ICD-10-CM | POA: Diagnosis not present

## 2022-09-04 ENCOUNTER — Other Ambulatory Visit: Payer: Self-pay | Admitting: Internal Medicine

## 2022-09-06 DIAGNOSIS — Z96641 Presence of right artificial hip joint: Secondary | ICD-10-CM | POA: Diagnosis not present

## 2022-09-06 DIAGNOSIS — M25551 Pain in right hip: Secondary | ICD-10-CM | POA: Diagnosis not present

## 2022-09-11 DIAGNOSIS — G8929 Other chronic pain: Secondary | ICD-10-CM | POA: Diagnosis not present

## 2022-09-11 DIAGNOSIS — M25561 Pain in right knee: Secondary | ICD-10-CM | POA: Diagnosis not present

## 2022-09-11 DIAGNOSIS — M545 Low back pain, unspecified: Secondary | ICD-10-CM | POA: Diagnosis not present

## 2022-09-11 DIAGNOSIS — M25551 Pain in right hip: Secondary | ICD-10-CM | POA: Diagnosis not present

## 2022-09-11 DIAGNOSIS — Z79899 Other long term (current) drug therapy: Secondary | ICD-10-CM | POA: Diagnosis not present

## 2022-09-13 ENCOUNTER — Other Ambulatory Visit: Payer: Self-pay | Admitting: Internal Medicine

## 2022-09-13 DIAGNOSIS — M25551 Pain in right hip: Secondary | ICD-10-CM | POA: Diagnosis not present

## 2022-09-13 DIAGNOSIS — Z79899 Other long term (current) drug therapy: Secondary | ICD-10-CM | POA: Diagnosis not present

## 2022-09-13 DIAGNOSIS — Z96641 Presence of right artificial hip joint: Secondary | ICD-10-CM | POA: Diagnosis not present

## 2022-09-26 ENCOUNTER — Ambulatory Visit: Payer: 59

## 2022-09-27 DIAGNOSIS — M25551 Pain in right hip: Secondary | ICD-10-CM | POA: Diagnosis not present

## 2022-09-27 DIAGNOSIS — Z96641 Presence of right artificial hip joint: Secondary | ICD-10-CM | POA: Diagnosis not present

## 2022-10-02 DIAGNOSIS — Z96641 Presence of right artificial hip joint: Secondary | ICD-10-CM | POA: Diagnosis not present

## 2022-10-02 DIAGNOSIS — M25551 Pain in right hip: Secondary | ICD-10-CM | POA: Diagnosis not present

## 2022-10-04 DIAGNOSIS — Z96641 Presence of right artificial hip joint: Secondary | ICD-10-CM | POA: Diagnosis not present

## 2022-10-04 DIAGNOSIS — M25551 Pain in right hip: Secondary | ICD-10-CM | POA: Diagnosis not present

## 2022-10-10 ENCOUNTER — Ambulatory Visit: Payer: 59 | Admitting: Internal Medicine

## 2022-10-11 DIAGNOSIS — M545 Low back pain, unspecified: Secondary | ICD-10-CM | POA: Diagnosis not present

## 2022-10-13 ENCOUNTER — Other Ambulatory Visit: Payer: Self-pay | Admitting: Internal Medicine

## 2022-10-16 ENCOUNTER — Encounter: Payer: Self-pay | Admitting: Internal Medicine

## 2022-10-16 DIAGNOSIS — M545 Low back pain, unspecified: Secondary | ICD-10-CM | POA: Diagnosis not present

## 2022-10-16 NOTE — Patient Instructions (Addendum)
    B12 injection today.    Blood work was ordered.   The lab is on the first floor.    Medications changes include :   none      Return in about 6 months (around 04/19/2023) for Physical Exam.

## 2022-10-16 NOTE — Progress Notes (Unsigned)
Subjective:    Patient ID: Misty [name], female    DOB: May 27, 1960, 62 y.o.   MRN: [id number]     HPI Estee is here for follow up of her chronic medical problems.  Chronic back pain-she is seeing pain management in the consider nerve ablation  Has occ nausea - just before or after she eats. Started prior to being on ozempic.  Takes zofran as needed.  No gerd   Medications and allergies reviewed with patient and updated if appropriate.  Current Outpatient Medications on File Prior to Visit  Medication Sig Dispense Refill   atorvastatin (LIPITOR) 20 MG tablet Take 1 tablet (20 mg total) by mouth daily. Annual appt due in Sept must see provider for future refills 90 tablet 0   Cholecalciferol (VITAMIN D3) 50 MCG (2000 UT) TABS TAKE 1 TABLET BY MOUTH EVERY DAY 90 tablet 3   ciclopirox (PENLAC) 8 % solution Apply topically at bedtime. Apply over nail and surrounding skin. Apply daily over previous coat. After seven (7) days, may remove with alcohol and continue cycle. 6.6 mL 0   cyanocobalamin (,VITAMIN B-12,) 1000 MCG/ML injection Inject 1,000 mcg into the muscle every 30 (thirty) days.     ferrous gluconate (FERGON) 324 MG tablet Take 1 tablet (324 mg total) by mouth daily.  3   gabapentin (NEURONTIN) 300 MG capsule TAKE 3 CAPSULES BY MOUTH THREE TIMES DAILY 270 capsule 0   lisinopril (ZESTRIL) 20 MG tablet Take 1 tablet (20 mg total) by mouth daily. 90 tablet 2   MELATONIN PO Take 1 tablet by mouth as needed (sleep).     methocarbamol (ROBAXIN) 500 MG tablet TAKE 1 TABLET BY MOUTH EVERY 8 HOURS AS NEEDED FOR MUSCLE SPASM 30 tablet 2   naloxone (NARCAN) nasal spray 4 mg/0.1 mL Place 1 spray into the nose as needed (opioid overdose).     ondansetron (ZOFRAN) 4 MG tablet Take 1 tablet (4 mg total) by mouth every 8 (eight) hours as needed for nausea or vomiting. 30 tablet 0   oxyCODONE (ROXICODONE) 15 MG immediate release tablet Take 1 tablet (15 mg total) by mouth every 4  (four) hours as needed for pain. 42 tablet 0   OZEMPIC, 1 MG/DOSE, 4 MG/3ML SOPN INJECT 1 MG  AS DIRECTED ONCE A WEEK 9 mL 0   Syringe/Needle, Disp, (SYRINGE 3CC/25GX1") 25G X 1" 3 ML MISC Use for monthly B12 injections 3 each 3   Turmeric (QC TUMERIC COMPLEX PO) Take 1 tablet by mouth daily.     XTAMPZA ER 18 MG C12A Take 18 mg by mouth every 12 (twelve) hours.     albuterol (PROAIR HFA) 108 (90 Base) MCG/ACT inhaler Inhale 1-2 puffs into the lungs every 6 (six) hours as needed for wheezing or shortness of breath. (Patient not taking: Reported on 10/17/2022) 1 each 5   No current facility-administered medications on file prior to visit.     Review of Systems  Constitutional:  Negative for fever.  HENT:  Negative for trouble swallowing.   Respiratory:  Negative for cough, shortness of breath and wheezing.   Cardiovascular:  Negative for chest pain, palpitations and leg swelling.  Gastrointestinal:  Positive for nausea (occ - before or after eating).       No gerd  Neurological:  Negative for light-headedness and headaches.       Objective:   Vitals:   10/17/22 0805  BP: 110/70  Pulse: 74  Temp: 98.4 F (36.9  C)  SpO2: 98%   BP Readings from Last 3 Encounters:  10/17/22 110/70  06/15/22 128/87  06/01/22 113/69   Wt Readings from Last 3 Encounters:  10/17/22 215 lb (97.5 kg)  08/23/22 225 lb (102.1 kg)  06/14/22 222 lb (100.7 kg)   Body mass index is 35.23 kg/m.    Physical Exam Constitutional:      General: She is not in acute distress.    Appearance: Normal appearance.  HENT:     Head: Normocephalic and atraumatic.  Eyes:     Conjunctiva/sclera: Conjunctivae normal.  Cardiovascular:     Rate and Rhythm: Normal rate and regular rhythm.     Heart sounds: Normal heart sounds.  Pulmonary:     Effort: Pulmonary effort is normal. No respiratory distress.     Breath sounds: Normal breath sounds. No wheezing.  Musculoskeletal:     Cervical back: Neck supple.      Right lower leg: No edema.     Left lower leg: No edema.  Lymphadenopathy:     Cervical: No cervical adenopathy.  Skin:    General: Skin is warm and dry.     Findings: No rash.  Neurological:     Mental Status: She is alert. Mental status is at baseline.  Psychiatric:        Mood and Affect: Mood normal.        Behavior: Behavior normal.        Lab Results  Component Value Date   WBC 6.8 06/15/2022   HGB 9.8 (L) 06/15/2022   HCT 33.0 (L) 06/15/2022   PLT 154 06/15/2022   GLUCOSE 124 (H) 06/15/2022   CHOL 182 04/11/2022   TRIG 105.0 04/11/2022   HDL 84.10 04/11/2022   LDLCALC 77 04/11/2022   ALT 14 06/01/2022   AST 18 06/01/2022   NA 133 (L) 06/15/2022   K 4.1 06/15/2022   CL 103 06/15/2022   CREATININE 0.72 06/15/2022   BUN 21 06/15/2022   CO2 24 06/15/2022   TSH 0.19 (L) 04/11/2022   INR 1.0 01/19/2021   HGBA1C 5.2 06/01/2022   MICROALBUR <0.7 04/11/2022     Assessment & Plan:    See Problem List for Assessment and Plan of chronic medical problems.

## 2022-10-17 ENCOUNTER — Encounter: Payer: Self-pay | Admitting: Podiatry

## 2022-10-17 ENCOUNTER — Ambulatory Visit (INDEPENDENT_AMBULATORY_CARE_PROVIDER_SITE_OTHER): Payer: 59 | Admitting: Podiatry

## 2022-10-17 ENCOUNTER — Encounter: Payer: Self-pay | Admitting: Internal Medicine

## 2022-10-17 ENCOUNTER — Ambulatory Visit (INDEPENDENT_AMBULATORY_CARE_PROVIDER_SITE_OTHER): Payer: 59 | Admitting: Internal Medicine

## 2022-10-17 VITALS — BP 110/70 | HR 74 | Temp 98.4°F | Ht 65.5 in | Wt 215.0 lb

## 2022-10-17 DIAGNOSIS — E538 Deficiency of other specified B group vitamins: Secondary | ICD-10-CM | POA: Diagnosis not present

## 2022-10-17 DIAGNOSIS — N1832 Chronic kidney disease, stage 3b: Secondary | ICD-10-CM

## 2022-10-17 DIAGNOSIS — G622 Polyneuropathy due to other toxic agents: Secondary | ICD-10-CM

## 2022-10-17 DIAGNOSIS — M48062 Spinal stenosis, lumbar region with neurogenic claudication: Secondary | ICD-10-CM

## 2022-10-17 DIAGNOSIS — I10 Essential (primary) hypertension: Secondary | ICD-10-CM | POA: Diagnosis not present

## 2022-10-17 DIAGNOSIS — E7849 Other hyperlipidemia: Secondary | ICD-10-CM

## 2022-10-17 DIAGNOSIS — R110 Nausea: Secondary | ICD-10-CM | POA: Diagnosis not present

## 2022-10-17 DIAGNOSIS — Q828 Other specified congenital malformations of skin: Secondary | ICD-10-CM

## 2022-10-17 DIAGNOSIS — R7303 Prediabetes: Secondary | ICD-10-CM | POA: Diagnosis not present

## 2022-10-17 DIAGNOSIS — Z6841 Body Mass Index (BMI) 40.0 and over, adult: Secondary | ICD-10-CM

## 2022-10-17 DIAGNOSIS — E0590 Thyrotoxicosis, unspecified without thyrotoxic crisis or storm: Secondary | ICD-10-CM | POA: Diagnosis not present

## 2022-10-17 DIAGNOSIS — E559 Vitamin D deficiency, unspecified: Secondary | ICD-10-CM

## 2022-10-17 MED ORDER — ATORVASTATIN CALCIUM 20 MG PO TABS: 20.0000 mg | ORAL_TABLET | Freq: Every day | ORAL | 1 refills | Status: DC

## 2022-10-17 MED ORDER — SEMAGLUTIDE (2 MG/DOSE) 8 MG/3ML ~~LOC~~ SOPN: 2.0000 mg | PEN_INJECTOR | SUBCUTANEOUS | 1 refills | Status: DC

## 2022-10-17 MED ORDER — LISINOPRIL 20 MG PO TABS: 20.0000 mg | ORAL_TABLET | Freq: Every day | ORAL | 2 refills | Status: DC

## 2022-10-17 MED ORDER — GABAPENTIN 300 MG PO CAPS: 900.0000 mg | ORAL_CAPSULE | Freq: Three times a day (TID) | ORAL | 2 refills | Status: DC

## 2022-10-17 MED ORDER — OMEPRAZOLE 20 MG PO CPDR: 20.0000 mg | DELAYED_RELEASE_CAPSULE | Freq: Every day | ORAL | 3 refills | Status: DC

## 2022-10-17 MED ORDER — ONDANSETRON HCL 4 MG PO TABS: 4.0000 mg | ORAL_TABLET | Freq: Three times a day (TID) | ORAL | 3 refills | Status: DC | PRN

## 2022-10-17 MED ORDER — CYANOCOBALAMIN 1000 MCG/ML IJ SOLN
1000.0000 ug | Freq: Once | INTRAMUSCULAR | Status: AC
  Administered 2022-10-17: 1000 ug via INTRAMUSCULAR

## 2022-10-17 NOTE — Progress Notes (Signed)
This patient presents to the office with chief complaint of painful callus right forefoot. She says this toe is very painful.  She is developing a callus on the outside of her right foot. She also has painful callus right forefoot. She presents to the office for preventative foot care services for callus care.    General Appearance  Alert, conversant and in no acute stress.  Vascular  Dorsalis pedis and posterior tibial  pulses are palpable  bilaterally.  Capillary return is within normal limits  bilaterally. Temperature is within normal limits  bilaterally.  Neurologic  Senn-Weinstein monofilament wire test within normal limits  bilaterally. Muscle power within normal limits bilaterally.  Nails Thick disfigured discolored nails with subungual debris  thick hallux nails  bilaterally. No evidence of bacterial infection or drainage bilaterally.  Orthopedic  No limitations of motion  feet .  No crepitus or effusions noted.  HAV  B/L.  Hammer toes 2-5  B/L especially  second toes both feet.  Fused IPJ right hallux.  Skin  normotropic skin noted bilaterally.  No signs of infections or ulcers noted.  Porokeratosis sub 3,5 right foot.  Porokeratosis right foot.  Hammer toes  B/L.  Debride porokeratosis with # 15 blade. And dremel tool. .  Patient requests consultation for surgical evaluation.   Helane Gunther DPM

## 2022-10-17 NOTE — Assessment & Plan Note (Addendum)
Chronic Goal is to lose weight so that she can have her hip replaced Stressed decrease portions in diet high in fiber, protein and vegetables Check TSH Doing well on Ozempic-continue-currently taking 1 mg weekly

## 2022-10-17 NOTE — Assessment & Plan Note (Signed)
Chronic lower back pain Following with pain management and they are managing her medications Takes pain medication as needed Continue methocarbamol 500 mg every 8 hours as needed Continue gabapentin 900 mg 3 times daily Continue TENS unit, pain patches, and pain medication per pain management

## 2022-10-17 NOTE — Assessment & Plan Note (Signed)
Chronic BP well controlled Continue lisinopril 10 mg daily cmp  

## 2022-10-17 NOTE — Assessment & Plan Note (Signed)
Chronic Taking vitamin D daily 

## 2022-10-17 NOTE — Assessment & Plan Note (Signed)
Chronic Cmp BP well controlled

## 2022-10-17 NOTE — Assessment & Plan Note (Addendum)
Chronic Secondary to gastric bypass B12 injection today

## 2022-10-17 NOTE — Assessment & Plan Note (Signed)
Chronic No gerd but h/o  barretts Start omeprazole 20 mg daily Consider f/u GI - Dr Dulce Sellar

## 2022-10-17 NOTE — Assessment & Plan Note (Signed)
Chronic Check a1c Low sugar / carb diet Stressed regular exercise  

## 2022-10-17 NOTE — Assessment & Plan Note (Signed)
Chronic Stinging/tingling sensation throughout body Continue monthly B12 injections Continue gabapentin 900 mg 3 times daily

## 2022-10-17 NOTE — Assessment & Plan Note (Signed)
Chronic Check lipid panel, CMP, TSH Continue atorvastatin 20 mg daily Regular exercise and healthy diet encouraged

## 2022-10-18 DIAGNOSIS — M545 Low back pain, unspecified: Secondary | ICD-10-CM | POA: Diagnosis not present

## 2022-10-18 DIAGNOSIS — G8929 Other chronic pain: Secondary | ICD-10-CM | POA: Diagnosis not present

## 2022-10-18 DIAGNOSIS — M25551 Pain in right hip: Secondary | ICD-10-CM | POA: Diagnosis not present

## 2022-10-18 DIAGNOSIS — M25561 Pain in right knee: Secondary | ICD-10-CM | POA: Diagnosis not present

## 2022-10-24 ENCOUNTER — Ambulatory Visit: Payer: 59 | Admitting: Podiatry

## 2022-10-25 DIAGNOSIS — M545 Low back pain, unspecified: Secondary | ICD-10-CM | POA: Diagnosis not present

## 2022-10-30 DIAGNOSIS — M545 Low back pain, unspecified: Secondary | ICD-10-CM | POA: Diagnosis not present

## 2022-11-01 ENCOUNTER — Encounter (INDEPENDENT_AMBULATORY_CARE_PROVIDER_SITE_OTHER): Payer: Self-pay

## 2022-11-06 DIAGNOSIS — M545 Low back pain, unspecified: Secondary | ICD-10-CM | POA: Diagnosis not present

## 2022-11-08 DIAGNOSIS — M25551 Pain in right hip: Secondary | ICD-10-CM | POA: Diagnosis not present

## 2022-11-08 DIAGNOSIS — M25561 Pain in right knee: Secondary | ICD-10-CM | POA: Diagnosis not present

## 2022-11-08 DIAGNOSIS — M545 Low back pain, unspecified: Secondary | ICD-10-CM | POA: Diagnosis not present

## 2022-11-08 DIAGNOSIS — G8929 Other chronic pain: Secondary | ICD-10-CM | POA: Diagnosis not present

## 2022-11-12 DIAGNOSIS — M545 Low back pain, unspecified: Secondary | ICD-10-CM | POA: Diagnosis not present

## 2022-11-12 DIAGNOSIS — M47896 Other spondylosis, lumbar region: Secondary | ICD-10-CM | POA: Diagnosis not present

## 2022-11-14 DIAGNOSIS — M545 Low back pain, unspecified: Secondary | ICD-10-CM | POA: Diagnosis not present

## 2022-11-16 DIAGNOSIS — M545 Low back pain, unspecified: Secondary | ICD-10-CM | POA: Diagnosis not present

## 2022-11-26 ENCOUNTER — Ambulatory Visit (INDEPENDENT_AMBULATORY_CARE_PROVIDER_SITE_OTHER): Payer: 59

## 2022-11-26 DIAGNOSIS — E538 Deficiency of other specified B group vitamins: Secondary | ICD-10-CM

## 2022-11-26 MED ORDER — CYANOCOBALAMIN 1000 MCG/ML IJ SOLN
1000.0000 ug | Freq: Once | INTRAMUSCULAR | Status: AC
  Administered 2022-11-26: 1000 ug via INTRAMUSCULAR

## 2022-11-26 NOTE — Progress Notes (Signed)
After obtaining consent, and per orders of Dr. Tawni Millers, injection of B12 given by Ferdie Ping. Patient instructed to report any adverse reaction to me immediately.

## 2022-11-27 DIAGNOSIS — M545 Low back pain, unspecified: Secondary | ICD-10-CM | POA: Diagnosis not present

## 2022-11-28 ENCOUNTER — Ambulatory Visit: Payer: 59

## 2022-11-28 DIAGNOSIS — Z23 Encounter for immunization: Secondary | ICD-10-CM

## 2022-11-28 DIAGNOSIS — M5451 Vertebrogenic low back pain: Secondary | ICD-10-CM | POA: Diagnosis not present

## 2022-11-28 NOTE — Progress Notes (Signed)
Pt received there flu vaccine today and responded well to it.

## 2022-11-29 DIAGNOSIS — M545 Low back pain, unspecified: Secondary | ICD-10-CM | POA: Diagnosis not present

## 2022-12-03 DIAGNOSIS — M47896 Other spondylosis, lumbar region: Secondary | ICD-10-CM | POA: Diagnosis not present

## 2022-12-03 DIAGNOSIS — M5416 Radiculopathy, lumbar region: Secondary | ICD-10-CM | POA: Diagnosis not present

## 2022-12-03 DIAGNOSIS — M545 Low back pain, unspecified: Secondary | ICD-10-CM | POA: Diagnosis not present

## 2022-12-04 DIAGNOSIS — M545 Low back pain, unspecified: Secondary | ICD-10-CM | POA: Diagnosis not present

## 2022-12-04 NOTE — Progress Notes (Unsigned)
Subjective:    Patient ID: Misty [name], female    DOB: 1960/04/11, 62 y.o.   MRN: [id number]      HPI Arryn is here for No chief complaint on file.    Spot on her back -     Medications and allergies reviewed with patient and updated if appropriate.  Current Outpatient Medications on File Prior to Visit  Medication Sig Dispense Refill   albuterol (PROAIR HFA) 108 (90 Base) MCG/ACT inhaler Inhale 1-2 puffs into the lungs every 6 (six) hours as needed for wheezing or shortness of breath. (Patient not taking: Reported on 10/17/2022) 1 each 5   atorvastatin (LIPITOR) 20 MG tablet Take 1 tablet (20 mg total) by mouth daily. 90 tablet 1   Cholecalciferol (VITAMIN D3) 50 MCG (2000 UT) TABS TAKE 1 TABLET BY MOUTH EVERY DAY 90 tablet 3   ciclopirox (PENLAC) 8 % solution Apply topically at bedtime. Apply over nail and surrounding skin. Apply daily over previous coat. After seven (7) days, may remove with alcohol and continue cycle. 6.6 mL 0   cyanocobalamin (,VITAMIN B-12,) 1000 MCG/ML injection Inject 1,000 mcg into the muscle every 30 (thirty) days.     ferrous gluconate (FERGON) 324 MG tablet Take 1 tablet (324 mg total) by mouth daily.  3   gabapentin (NEURONTIN) 300 MG capsule Take 3 capsules (900 mg total) by mouth 3 (three) times daily. 270 capsule 2   gabapentin (NEURONTIN) 300 MG capsule TAKE 3 CAPSULES BY MOUTH THREE TIMES DAILY 270 capsule 0   lisinopril (ZESTRIL) 20 MG tablet Take 1 tablet (20 mg total) by mouth daily. 90 tablet 2   MELATONIN PO Take 1 tablet by mouth as needed (sleep).     methocarbamol (ROBAXIN) 500 MG tablet TAKE 1 TABLET BY MOUTH EVERY 8 HOURS AS NEEDED FOR MUSCLE SPASM 30 tablet 2   naloxone (NARCAN) nasal spray 4 mg/0.1 mL Place 1 spray into the nose as needed (opioid overdose).     omeprazole (PRILOSEC) 20 MG capsule Take 1 capsule (20 mg total) by mouth daily. Take 30 minutes prior to a meal 90 capsule 3   ondansetron (ZOFRAN) 4 MG tablet Take 1  tablet (4 mg total) by mouth every 8 (eight) hours as needed for nausea or vomiting. 30 tablet 3   oxyCODONE (ROXICODONE) 15 MG immediate release tablet Take 1 tablet (15 mg total) by mouth every 4 (four) hours as needed for pain. 42 tablet 0   OZEMPIC, 1 MG/DOSE, 4 MG/3ML SOPN INJECT 1 MG  AS DIRECTED ONCE A WEEK 9 mL 0   Semaglutide, 2 MG/DOSE, 8 MG/3ML SOPN Inject 2 mg as directed once a week. 3 mL 1   Syringe/Needle, Disp, (SYRINGE 3CC/25GX1") 25G X 1" 3 ML MISC Use for monthly B12 injections 3 each 3   Turmeric (QC TUMERIC COMPLEX PO) Take 1 tablet by mouth daily.     XTAMPZA ER 18 MG C12A Take 18 mg by mouth every 12 (twelve) hours.     No current facility-administered medications on file prior to visit.    Review of Systems     Objective:  There were no vitals filed for this visit. BP Readings from Last 3 Encounters:  10/17/22 110/70  06/15/22 128/87  06/01/22 113/69   Wt Readings from Last 3 Encounters:  10/17/22 215 lb (97.5 kg)  08/23/22 225 lb (102.1 kg)  06/14/22 222 lb (100.7 kg)   There is no height or weight on file to  calculate BMI.    Physical Exam         Assessment & Plan:    See Problem List for Assessment and Plan of chronic medical problems.

## 2022-12-05 ENCOUNTER — Encounter: Payer: Self-pay | Admitting: Internal Medicine

## 2022-12-05 ENCOUNTER — Ambulatory Visit (INDEPENDENT_AMBULATORY_CARE_PROVIDER_SITE_OTHER): Payer: 59 | Admitting: Internal Medicine

## 2022-12-05 VITALS — BP 120/80 | HR 72 | Temp 98.1°F | Ht 65.5 in | Wt 212.0 lb

## 2022-12-05 DIAGNOSIS — R9389 Abnormal findings on diagnostic imaging of other specified body structures: Secondary | ICD-10-CM | POA: Insufficient documentation

## 2022-12-05 DIAGNOSIS — B3731 Acute candidiasis of vulva and vagina: Secondary | ICD-10-CM | POA: Diagnosis not present

## 2022-12-05 DIAGNOSIS — K8070 Calculus of gallbladder and bile duct without cholecystitis without obstruction: Secondary | ICD-10-CM

## 2022-12-05 DIAGNOSIS — I10 Essential (primary) hypertension: Secondary | ICD-10-CM

## 2022-12-05 DIAGNOSIS — J069 Acute upper respiratory infection, unspecified: Secondary | ICD-10-CM

## 2022-12-05 MED ORDER — FLUCONAZOLE 150 MG PO TABS: 150.0000 mg | ORAL_TABLET | Freq: Once | ORAL | 0 refills | Status: AC

## 2022-12-05 MED ORDER — BENZONATATE 200 MG PO CAPS: 200.0000 mg | ORAL_CAPSULE | Freq: Three times a day (TID) | ORAL | 0 refills | Status: DC | PRN

## 2022-12-05 NOTE — Assessment & Plan Note (Signed)
Acute Symptoms likely viral in nature No need for antibiotics Tessalon Perles 200 mg 3 times daily as needed for cough sent to pharmacy Continue symptomatic treatment with over-the-counter cold medications, Tylenol/ibuprofen Increase rest and fluids Call if symptoms worsen or do not improve

## 2022-12-05 NOTE — Patient Instructions (Addendum)
       Medications changes include :   fluconazole 150 mg x 1, benzonatate 200 mg three times if needed    A Ct scan was ordered for [facility] and someone will call you to schedule an appointment.

## 2022-12-05 NOTE — Assessment & Plan Note (Signed)
Acute Recent MRI at [facility] w/ abnormality - region of S2 intermediate/low T1 signal and elevated STIR signal, likely atypical hemangioma She did have colon cancer 25 years ago and status post partial colectomy She is very concerned about having this followed up and making sure it is not cancer CT with contrast ordered of abdomen and pelvis-May need to change depending on radiology's thoughts

## 2022-12-05 NOTE — Assessment & Plan Note (Signed)
Chronic ?BP well controlled ?Continue lisinopril 10 mg daily ? ?

## 2022-12-05 NOTE — Assessment & Plan Note (Signed)
Acute Related to recent antibiotic use No vaginal discharge-just itching and irritation of vulva Fluconazole 150 mg x 1

## 2022-12-06 DIAGNOSIS — M545 Low back pain, unspecified: Secondary | ICD-10-CM | POA: Diagnosis not present

## 2022-12-11 ENCOUNTER — Ambulatory Visit
Admission: RE | Admit: 2022-12-11 | Discharge: 2022-12-11 | Disposition: A | Discharge status: Home / Self Care | Payer: 59 | Source: Ambulatory Visit | Attending: Internal Medicine | Admitting: Internal Medicine

## 2022-12-11 DIAGNOSIS — K838 Other specified diseases of biliary tract: Secondary | ICD-10-CM | POA: Diagnosis not present

## 2022-12-11 DIAGNOSIS — K8020 Calculus of gallbladder without cholecystitis without obstruction: Secondary | ICD-10-CM | POA: Diagnosis not present

## 2022-12-11 DIAGNOSIS — K828 Other specified diseases of gallbladder: Secondary | ICD-10-CM | POA: Diagnosis not present

## 2022-12-11 DIAGNOSIS — R9389 Abnormal findings on diagnostic imaging of other specified body structures: Secondary | ICD-10-CM

## 2022-12-11 DIAGNOSIS — C189 Malignant neoplasm of colon, unspecified: Secondary | ICD-10-CM | POA: Diagnosis not present

## 2022-12-11 MED ORDER — IOPAMIDOL (ISOVUE-300) INJECTION 61%
100.0000 mL | Freq: Once | INTRAVENOUS | Status: AC | PRN
  Administered 2022-12-11: 100 mL via INTRAVENOUS

## 2022-12-13 DIAGNOSIS — M545 Low back pain, unspecified: Secondary | ICD-10-CM | POA: Diagnosis not present

## 2022-12-17 ENCOUNTER — Other Ambulatory Visit: Payer: Self-pay | Admitting: Internal Medicine

## 2022-12-17 DIAGNOSIS — M545 Low back pain, unspecified: Secondary | ICD-10-CM | POA: Diagnosis not present

## 2022-12-18 DIAGNOSIS — M25561 Pain in right knee: Secondary | ICD-10-CM | POA: Diagnosis not present

## 2022-12-18 DIAGNOSIS — G8929 Other chronic pain: Secondary | ICD-10-CM | POA: Diagnosis not present

## 2022-12-18 DIAGNOSIS — M25551 Pain in right hip: Secondary | ICD-10-CM | POA: Diagnosis not present

## 2022-12-18 DIAGNOSIS — M545 Low back pain, unspecified: Secondary | ICD-10-CM | POA: Diagnosis not present

## 2022-12-18 NOTE — Telephone Encounter (Signed)
Pharmacist, community

## 2022-12-20 ENCOUNTER — Encounter: Payer: Self-pay | Admitting: Internal Medicine

## 2022-12-20 DIAGNOSIS — M545 Low back pain, unspecified: Secondary | ICD-10-CM | POA: Diagnosis not present

## 2022-12-24 ENCOUNTER — Other Ambulatory Visit: Payer: Self-pay | Admitting: Internal Medicine

## 2022-12-24 DIAGNOSIS — Z1231 Encounter for screening mammogram for malignant neoplasm of breast: Secondary | ICD-10-CM

## 2022-12-25 DIAGNOSIS — M545 Low back pain, unspecified: Secondary | ICD-10-CM | POA: Diagnosis not present

## 2022-12-26 ENCOUNTER — Ambulatory Visit (INDEPENDENT_AMBULATORY_CARE_PROVIDER_SITE_OTHER): Payer: 59 | Admitting: Podiatry

## 2022-12-26 ENCOUNTER — Other Ambulatory Visit: Payer: Self-pay

## 2022-12-26 ENCOUNTER — Encounter: Payer: Self-pay | Admitting: Podiatry

## 2022-12-26 ENCOUNTER — Telehealth: Payer: Self-pay | Admitting: Internal Medicine

## 2022-12-26 ENCOUNTER — Ambulatory Visit (INDEPENDENT_AMBULATORY_CARE_PROVIDER_SITE_OTHER): Payer: 59 | Admitting: Radiology

## 2022-12-26 DIAGNOSIS — N1832 Chronic kidney disease, stage 3b: Secondary | ICD-10-CM

## 2022-12-26 DIAGNOSIS — E538 Deficiency of other specified B group vitamins: Secondary | ICD-10-CM

## 2022-12-26 DIAGNOSIS — Q828 Other specified congenital malformations of skin: Secondary | ICD-10-CM | POA: Diagnosis not present

## 2022-12-26 MED ORDER — CYANOCOBALAMIN 1000 MCG/ML IJ SOLN
1000.0000 ug | Freq: Once | INTRAMUSCULAR | Status: AC
  Administered 2022-12-26: 1000 ug via INTRAMUSCULAR

## 2022-12-26 MED ORDER — DICLOFENAC SODIUM 1 % EX GEL: 2.0000 g | Freq: Four times a day (QID) | CUTANEOUS | 5 refills | Status: AC

## 2022-12-26 MED ORDER — ALBUTEROL SULFATE HFA 108 (90 BASE) MCG/ACT IN AERS: 1.0000 | INHALATION_SPRAY | Freq: Four times a day (QID) | RESPIRATORY_TRACT | 5 refills | Status: AC | PRN

## 2022-12-26 NOTE — Progress Notes (Signed)
Patients here for monthly B12 injection. Patient tolerated well with no complications.

## 2022-12-26 NOTE — Progress Notes (Signed)
This patient presents to the office with chief complaint of painful callus right forefoot. She says this toe is very painful.  She is developing a callus on the outside of her right foot. She also has painful callus right forefoot. She presents to the office for preventative foot care services for callus care.    General Appearance  Alert, conversant and in no acute stress.  Vascular  Dorsalis pedis and posterior tibial  pulses are palpable  bilaterally.  Capillary return is within normal limits  bilaterally. Temperature is within normal limits  bilaterally.  Neurologic  Senn-Weinstein monofilament wire test within normal limits  bilaterally. Muscle power within normal limits bilaterally.  Nails Thick disfigured discolored nails with subungual debris  thick hallux nails  bilaterally. No evidence of bacterial infection or drainage bilaterally.  Orthopedic  No limitations of motion  feet .  No crepitus or effusions noted.  HAV  B/L.  Hammer toes 2-5  B/L especially  second toes both feet.  Fused IPJ right hallux. Plantar flexed metatarsal right foot.  Skin  normotropic skin noted bilaterally.  No signs of infections or ulcers noted.  Porokeratosis sub 3,5 right foot.  Porokeratosis right foot.  Hammer toes  B/L.  Debride porokeratosis with # 15 blade. And dremel tool. .  Patient requests consultation for surgical evaluation.   Helane Gunther DPM

## 2022-12-26 NOTE — Telephone Encounter (Signed)
Error message

## 2022-12-28 DIAGNOSIS — M545 Low back pain, unspecified: Secondary | ICD-10-CM | POA: Diagnosis not present

## 2022-12-31 NOTE — Addendum Note (Signed)
Addended by: Pincus Sanes on: 12/31/2022 09:32 PM   Modules accepted: Orders

## 2023-01-01 ENCOUNTER — Encounter: Payer: Self-pay | Admitting: Internal Medicine

## 2023-01-01 DIAGNOSIS — M25562 Pain in left knee: Secondary | ICD-10-CM | POA: Diagnosis not present

## 2023-01-01 DIAGNOSIS — M1712 Unilateral primary osteoarthritis, left knee: Secondary | ICD-10-CM | POA: Diagnosis not present

## 2023-01-01 DIAGNOSIS — Z471 Aftercare following joint replacement surgery: Secondary | ICD-10-CM | POA: Diagnosis not present

## 2023-01-01 DIAGNOSIS — Z96651 Presence of right artificial knee joint: Secondary | ICD-10-CM | POA: Diagnosis not present

## 2023-01-03 DIAGNOSIS — M545 Low back pain, unspecified: Secondary | ICD-10-CM | POA: Diagnosis not present

## 2023-01-08 DIAGNOSIS — M545 Low back pain, unspecified: Secondary | ICD-10-CM | POA: Diagnosis not present

## 2023-01-10 ENCOUNTER — Other Ambulatory Visit: Payer: Self-pay | Admitting: Internal Medicine

## 2023-01-13 ENCOUNTER — Other Ambulatory Visit: Payer: Self-pay | Admitting: Internal Medicine

## 2023-01-16 ENCOUNTER — Ambulatory Visit
Admission: RE | Admit: 2023-01-16 | Discharge: 2023-01-16 | Disposition: A | Discharge status: Home / Self Care | Payer: 59 | Source: Ambulatory Visit | Attending: Internal Medicine | Admitting: Internal Medicine

## 2023-01-16 DIAGNOSIS — Z9884 Bariatric surgery status: Secondary | ICD-10-CM | POA: Diagnosis not present

## 2023-01-16 DIAGNOSIS — K8070 Calculus of gallbladder and bile duct without cholecystitis without obstruction: Secondary | ICD-10-CM

## 2023-01-16 DIAGNOSIS — K8020 Calculus of gallbladder without cholecystitis without obstruction: Secondary | ICD-10-CM | POA: Diagnosis not present

## 2023-01-16 MED ORDER — GADOPICLENOL 0.5 MMOL/ML IV SOLN
10.0000 mL | Freq: Once | INTRAVENOUS | Status: AC | PRN
  Administered 2023-01-16: 10 mL via INTRAVENOUS

## 2023-01-17 DIAGNOSIS — M25551 Pain in right hip: Secondary | ICD-10-CM | POA: Diagnosis not present

## 2023-01-17 DIAGNOSIS — M5416 Radiculopathy, lumbar region: Secondary | ICD-10-CM | POA: Diagnosis not present

## 2023-01-17 DIAGNOSIS — M545 Low back pain, unspecified: Secondary | ICD-10-CM | POA: Diagnosis not present

## 2023-01-17 DIAGNOSIS — G8929 Other chronic pain: Secondary | ICD-10-CM | POA: Diagnosis not present

## 2023-01-17 DIAGNOSIS — M25561 Pain in right knee: Secondary | ICD-10-CM | POA: Diagnosis not present

## 2023-01-23 ENCOUNTER — Ambulatory Visit: Payer: 59

## 2023-01-24 ENCOUNTER — Ambulatory Visit
Admission: RE | Admit: 2023-01-24 | Discharge: 2023-01-24 | Disposition: A | Discharge status: Home / Self Care | Payer: 59 | Source: Ambulatory Visit | Attending: Internal Medicine | Admitting: Internal Medicine

## 2023-01-24 DIAGNOSIS — Z1231 Encounter for screening mammogram for malignant neoplasm of breast: Secondary | ICD-10-CM

## 2023-01-25 ENCOUNTER — Encounter: Payer: Self-pay | Admitting: Podiatry

## 2023-01-25 ENCOUNTER — Ambulatory Visit (INDEPENDENT_AMBULATORY_CARE_PROVIDER_SITE_OTHER): Payer: 59 | Admitting: Podiatry

## 2023-01-25 ENCOUNTER — Ambulatory Visit (INDEPENDENT_AMBULATORY_CARE_PROVIDER_SITE_OTHER): Payer: 59

## 2023-01-25 DIAGNOSIS — M2041 Other hammer toe(s) (acquired), right foot: Secondary | ICD-10-CM

## 2023-01-25 DIAGNOSIS — D492 Neoplasm of unspecified behavior of bone, soft tissue, and skin: Secondary | ICD-10-CM

## 2023-01-25 DIAGNOSIS — M545 Low back pain, unspecified: Secondary | ICD-10-CM | POA: Diagnosis not present

## 2023-01-25 DIAGNOSIS — M216X9 Other acquired deformities of unspecified foot: Secondary | ICD-10-CM

## 2023-01-27 NOTE — Progress Notes (Signed)
Subjective:   Patient ID: Misty [name], female   DOB: 62 y.o.   MRN: [id number]   HPI Patient presents with severe lesion plantar aspect right foot that is getting worse and is no longer responding to trimming and has digital deformities stated she had a history of surgery on this a number of years ago and cannot remember when that occurred.  Patient does not smoke and tries to stay active   Review of Systems  All other systems reviewed and are negative.       Objective:  Physical Exam Vitals and nursing note reviewed.  Constitutional:      Appearance: She is well-developed.  Pulmonary:     Effort: Pulmonary effort is normal.  Musculoskeletal:        General: Normal range of motion.  Skin:    General: Skin is warm.  Neurological:     Mental Status: She is alert.     Neurovascular status was found to be intact muscle strength was found to be adequate.  Range of motion of the subtalar midtarsal joint adequate with patient found to have significant digital deformities of digits 234 of the right foot that are curled and placing plantar pressure.  There is a lesions of second third metatarsal right very painful when pressed which makes walking difficult and it is not responding to treatment that has been rendered in this office.  Patient has good digital perfusion well-oriented x 3     Assessment:  Significant structural malalignment plantar lesion formation which may be due to this malalignment and also may strictly be related to skin pathology     Plan:  H&P reviewed and I discussed this at great length.  I do think that the best chance we have to help her the surgical intervention even though there is no long-term guarantees this will do it.  I reviewed x-rays and at this point I allowed her to read consent form going over alternative treatments complications associated with this with digital fusion digits 234 shortening of the second and third metatarsals right with fixation  and primary cutting out of the lesion plantarly with wide excision.  Patient understands everything is outlined signed consent form understands she will pins in her toes for 5 weeks she will be immobilized completely and that total recovery can take 6 months to 1 year with no guarantees this will take care of the lesion.  I dispensed air fracture walker properly fitting into her lower leg and I want her to wear it prior to surgery to get used to it takes pressure off her foot and I want her to find a shoe that got a wedge on the left so she feels comfortable.  Encouraged her to call with questions concerns which may arise prior to procedure  X-rays indicate significant digital contracture and they did indicate the lesion appears to be associated with both the second and third metatarsal

## 2023-01-28 ENCOUNTER — Ambulatory Visit (INDEPENDENT_AMBULATORY_CARE_PROVIDER_SITE_OTHER): Payer: 59

## 2023-01-28 DIAGNOSIS — E538 Deficiency of other specified B group vitamins: Secondary | ICD-10-CM | POA: Diagnosis not present

## 2023-01-28 MED ORDER — CYANOCOBALAMIN 1000 MCG/ML IJ SOLN
1000.0000 ug | Freq: Once | INTRAMUSCULAR | Status: AC
  Administered 2023-01-28: 1000 ug via INTRAMUSCULAR

## 2023-01-28 NOTE — Progress Notes (Signed)
After obtaining consent, and per orders of Dr. Lawerance Bach, injection of B12 given by Ferdie Ping. Patient instructed to report any adverse reaction to me immediately.

## 2023-01-30 DIAGNOSIS — H00025 Hordeolum internum left lower eyelid: Secondary | ICD-10-CM | POA: Diagnosis not present

## 2023-01-30 DIAGNOSIS — H35363 Drusen (degenerative) of macula, bilateral: Secondary | ICD-10-CM | POA: Diagnosis not present

## 2023-01-30 DIAGNOSIS — D23122 Other benign neoplasm of skin of left lower eyelid, including canthus: Secondary | ICD-10-CM | POA: Diagnosis not present

## 2023-01-30 DIAGNOSIS — H524 Presbyopia: Secondary | ICD-10-CM | POA: Diagnosis not present

## 2023-01-30 DIAGNOSIS — H25813 Combined forms of age-related cataract, bilateral: Secondary | ICD-10-CM | POA: Diagnosis not present

## 2023-01-31 DIAGNOSIS — M545 Low back pain, unspecified: Secondary | ICD-10-CM | POA: Diagnosis not present

## 2023-02-05 ENCOUNTER — Other Ambulatory Visit: Payer: Self-pay | Admitting: Internal Medicine

## 2023-02-05 NOTE — Progress Notes (Unsigned)
Subjective:    Patient ID: Misty [name], female    DOB: 1960-04-29, 62 y.o.   MRN: [id number]      HPI Misty [name] is here for No chief complaint on file.      MRI  - GB w/ gallstones.   Medications and allergies reviewed with patient and updated if appropriate.  Current Outpatient Medications on File Prior to Visit  Medication Sig Dispense Refill   albuterol (PROAIR HFA) 108 (90 Base) MCG/ACT inhaler Inhale 1-2 puffs into the lungs every 6 (six) hours as needed for wheezing or shortness of breath. 1 each 5   atorvastatin (LIPITOR) 20 MG tablet Take 1 tablet (20 mg total) by mouth daily. 90 tablet 1   benzonatate (TESSALON) 200 MG capsule Take 1 capsule (200 mg total) by mouth 3 (three) times daily as needed for cough. 30 capsule 0   Cholecalciferol (VITAMIN D3) 50 MCG (2000 UT) TABS TAKE 1 TABLET BY MOUTH EVERY DAY 90 tablet 3   ciclopirox (PENLAC) 8 % solution Apply topically at bedtime. Apply over nail and surrounding skin. Apply daily over previous coat. After seven (7) days, may remove with alcohol and continue cycle. 6.6 mL 0   cyanocobalamin (,VITAMIN B-12,) 1000 MCG/ML injection Inject 1,000 mcg into the muscle every 30 (thirty) days.     diclofenac Sodium (VOLTAREN) 1 % GEL Apply 2 g topically 4 (four) times daily. APPLY 2 GRAMS TOPICALLY 4 TIMES DAILY 100 g 5   ferrous gluconate (FERGON) 324 MG tablet Take 1 tablet (324 mg total) by mouth daily.  3   gabapentin (NEURONTIN) 300 MG capsule Take 3 capsules (900 mg total) by mouth 3 (three) times daily. 270 capsule 2   lisinopril (ZESTRIL) 20 MG tablet Take 1 tablet (20 mg total) by mouth daily. 90 tablet 2   MELATONIN PO Take 1 tablet by mouth as needed (sleep).     methocarbamol (ROBAXIN) 500 MG tablet TAKE 1 TABLET BY MOUTH EVERY 8 HOURS AS NEEDED FOR MUSCLE SPASM 30 tablet 2   naloxone (NARCAN) nasal spray 4 mg/0.1 mL Place 1 spray into the nose as needed (opioid overdose).     omeprazole (PRILOSEC) 20 MG capsule Take  1 capsule (20 mg total) by mouth daily. Take 30 minutes prior to a meal 90 capsule 3   ondansetron (ZOFRAN) 4 MG tablet Take 1 tablet (4 mg total) by mouth every 8 (eight) hours as needed for nausea or vomiting. 30 tablet 3   oxyCODONE (ROXICODONE) 15 MG immediate release tablet Take 1 tablet (15 mg total) by mouth every 4 (four) hours as needed for pain. 42 tablet 0   oxyCODONE-acetaminophen (PERCOCET) 10-325 MG tablet Take 1 tablet by mouth 4 (four) times daily as needed.     OZEMPIC, 2 MG/DOSE, 8 MG/3ML SOPN INJECT 2MG  INTO THE SKIN ONCE A WEEK 3 mL 0   Syringe/Needle, Disp, (SYRINGE 3CC/25GX1") 25G X 1" 3 ML MISC Use for monthly B12 injections 3 each 3   Turmeric (QC TUMERIC COMPLEX PO) Take 1 tablet by mouth daily.     XTAMPZA ER 18 MG C12A Take 18 mg by mouth every 12 (twelve) hours.     No current facility-administered medications on file prior to visit.    Review of Systems     Objective:  There were no vitals filed for this visit. BP Readings from Last 3 Encounters:  12/05/22 120/80  10/17/22 110/70  06/15/22 128/87   Wt Readings from Last 3 Encounters:  12/05/22 212 lb (96.2 kg)  10/17/22 215 lb (97.5 kg)  08/23/22 225 lb (102.1 kg)   There is no height or weight on file to calculate BMI.    Physical Exam         Assessment & Plan:    See Problem List for Assessment and Plan of chronic medical problems.

## 2023-02-06 ENCOUNTER — Other Ambulatory Visit: Payer: Self-pay | Admitting: Internal Medicine

## 2023-02-06 ENCOUNTER — Ambulatory Visit (INDEPENDENT_AMBULATORY_CARE_PROVIDER_SITE_OTHER): Payer: 59 | Admitting: Internal Medicine

## 2023-02-06 VITALS — BP 130/78 | HR 78 | Temp 98.0°F | Ht 65.5 in | Wt 213.0 lb

## 2023-02-06 DIAGNOSIS — M545 Low back pain, unspecified: Secondary | ICD-10-CM | POA: Diagnosis not present

## 2023-02-06 DIAGNOSIS — M25561 Pain in right knee: Secondary | ICD-10-CM | POA: Diagnosis not present

## 2023-02-06 DIAGNOSIS — I10 Essential (primary) hypertension: Secondary | ICD-10-CM | POA: Diagnosis not present

## 2023-02-06 DIAGNOSIS — G8929 Other chronic pain: Secondary | ICD-10-CM | POA: Diagnosis not present

## 2023-02-06 DIAGNOSIS — R0789 Other chest pain: Secondary | ICD-10-CM | POA: Insufficient documentation

## 2023-02-06 DIAGNOSIS — R0781 Pleurodynia: Secondary | ICD-10-CM | POA: Diagnosis not present

## 2023-02-06 DIAGNOSIS — R350 Frequency of micturition: Secondary | ICD-10-CM

## 2023-02-06 DIAGNOSIS — G479 Sleep disorder, unspecified: Secondary | ICD-10-CM | POA: Diagnosis not present

## 2023-02-06 DIAGNOSIS — M25551 Pain in right hip: Secondary | ICD-10-CM | POA: Diagnosis not present

## 2023-02-06 LAB — POC URINALSYSI DIPSTICK (AUTOMATED)
Blood, UA: NEGATIVE
Glucose, UA: NEGATIVE
Ketones, UA: NEGATIVE
Leukocytes, UA: NEGATIVE
Nitrite, UA: NEGATIVE
Protein, UA: NEGATIVE
Spec Grav, UA: >=1.03 — AB (ref 1.010–1.025)
Urobilinogen, UA: 0.2 U/dL
pH, UA: 5 (ref 5.0–8.0)

## 2023-02-06 MED ORDER — FLUCONAZOLE 150 MG PO TABS: 150.0000 mg | ORAL_TABLET | Freq: Once | ORAL | 0 refills | Status: AC

## 2023-02-06 MED ORDER — METHOCARBAMOL 500 MG PO TABS: ORAL_TABLET | ORAL | 2 refills | Status: DC

## 2023-02-06 NOTE — Assessment & Plan Note (Signed)
Chronic Melatonin is not effective Trazodone she did not tolerate-she thinks she had hallucinations Can try something different over-the-counter Stressed good sleep hygiene I am reluctant to prescribe her any sleep medications since she is already on opioid pain medications

## 2023-02-06 NOTE — Assessment & Plan Note (Signed)
Chronic ?BP well controlled ?Continue lisinopril 10 mg daily ? ?

## 2023-02-06 NOTE — Assessment & Plan Note (Addendum)
Acute Started 3 days ago Msk in nature - worse with movement, laughing or deep breaths Already on pain medication and muscle relaxer - only taking methocarbamol at night - increase to three a day-new prescription sent to pharmacy Can try a topical medication Deferred steroids

## 2023-02-06 NOTE — Assessment & Plan Note (Signed)
Has some urinary frequency which is likely chronic Her symptoms are more muscular skeletal in nature Urine dip here negative for infection Will not send for culture

## 2023-02-06 NOTE — Patient Instructions (Addendum)
        Medications changes include :   increase the methocarbamol to three times a day.  Apply topical medication for muscle pain      Return if symptoms worsen or fail to improve.

## 2023-02-12 DIAGNOSIS — M545 Low back pain, unspecified: Secondary | ICD-10-CM | POA: Diagnosis not present

## 2023-02-18 DIAGNOSIS — M545 Low back pain, unspecified: Secondary | ICD-10-CM | POA: Diagnosis not present

## 2023-02-19 ENCOUNTER — Telehealth: Payer: Self-pay | Admitting: Urology

## 2023-02-19 NOTE — Telephone Encounter (Signed)
Called pt and LM to call back to schedule sx

## 2023-02-27 ENCOUNTER — Encounter: Payer: Self-pay | Admitting: Internal Medicine

## 2023-02-27 ENCOUNTER — Ambulatory Visit: Payer: 59

## 2023-02-27 ENCOUNTER — Ambulatory Visit (INDEPENDENT_AMBULATORY_CARE_PROVIDER_SITE_OTHER): Payer: 59 | Admitting: Internal Medicine

## 2023-02-27 VITALS — BP 132/80 | HR 83 | Temp 98.3°F | Ht 65.5 in | Wt 203.0 lb

## 2023-02-27 DIAGNOSIS — R0781 Pleurodynia: Secondary | ICD-10-CM | POA: Diagnosis not present

## 2023-02-27 DIAGNOSIS — E538 Deficiency of other specified B group vitamins: Secondary | ICD-10-CM | POA: Diagnosis not present

## 2023-02-27 MED ORDER — CYANOCOBALAMIN 1000 MCG/ML IJ SOLN
1000.0000 ug | Freq: Once | INTRAMUSCULAR | Status: AC
  Administered 2023-02-27: 1000 ug via INTRAMUSCULAR

## 2023-02-27 MED ORDER — METHYLPREDNISOLONE 4 MG PO TBPK: ORAL_TABLET | ORAL | 0 refills | Status: DC

## 2023-02-27 NOTE — Assessment & Plan Note (Signed)
Right side pain-not truly ribs Pain is in the right lateral lower back to lateral side under her ribs Pain is worse with movements, changes in position and therefore is likely muscular skeletal Discussed with her that this is not anything internal  Possibly referred pain from lumbar spine or muscle strain Recommended that she see her orthopedic for further evaluation-I think he may be able to give [name] a better idea of what is causing the pain so we can better treat it Start Medrol Dosepak

## 2023-02-27 NOTE — Progress Notes (Signed)
Subjective:    Patient ID: Misty [name], female    DOB: 1960-07-20, 62 y.o.   MRN: [id number]      HPI Mosley is here for  Chief Complaint  Patient presents with   Flank Pain    Right sided flank pain (still having pain from 02/06/23)    Pain started about one month ago.  It has not improved since then.  Pain in right side  - sharp pain that comes and goes.  The pain is worse when she gets up and walks.   The pain is worse with certain movements and activities.  She dropped something and when she went down to pick it up the bending down and picking it up caused pain.  Any pull on the right side agitates it.  When she was here last she was having increased pain with deep breaths, but states that is no longer the case.  Methocarbamol has not helped.  She is on chronic pain medication and that does not seem to help.  Heat helps.     May have a little increased urinary frequency and increased urgency.  When she has to go the pain may be a little worse       Medications and allergies reviewed with patient and updated if appropriate.  Current Outpatient Medications on File Prior to Visit  Medication Sig Dispense Refill   albuterol (PROAIR HFA) 108 (90 Base) MCG/ACT inhaler Inhale 1-2 puffs into the lungs every 6 (six) hours as needed for wheezing or shortness of breath. 1 each 5   atorvastatin (LIPITOR) 20 MG tablet Take 1 tablet (20 mg total) by mouth daily. 90 tablet 1   benzonatate (TESSALON) 200 MG capsule Take 1 capsule (200 mg total) by mouth 3 (three) times daily as needed for cough. 30 capsule 0   Cholecalciferol (VITAMIN D3) 50 MCG (2000 UT) TABS TAKE 1 TABLET BY MOUTH EVERY DAY 90 tablet 3   ciclopirox (PENLAC) 8 % solution Apply topically at bedtime. Apply over nail and surrounding skin. Apply daily over previous coat. After seven (7) days, may remove with alcohol and continue cycle. 6.6 mL 0   cyanocobalamin (,VITAMIN B-12,) 1000 MCG/ML injection Inject 1,000  mcg into the muscle every 30 (thirty) days.     diclofenac Sodium (VOLTAREN) 1 % GEL Apply 2 g topically 4 (four) times daily. APPLY 2 GRAMS TOPICALLY 4 TIMES DAILY 100 g 5   doxycycline (MONODOX) 100 MG capsule Take 1 capsule by mouth 2 (two) times daily.     ferrous gluconate (FERGON) 324 MG tablet Take 1 tablet (324 mg total) by mouth daily.  3   gabapentin (NEURONTIN) 300 MG capsule TAKE 3 CAPSULES BY MOUTH THREE TIMES DAILY 270 capsule 0   lisinopril (ZESTRIL) 20 MG tablet Take 1 tablet (20 mg total) by mouth daily. 90 tablet 2   MAXITROL 3.5-10000-0.1 OINT SMARTSIG:1 sparingly In Eye(s) Twice Daily     MELATONIN PO Take 1 tablet by mouth as needed (sleep).     methocarbamol (ROBAXIN) 500 MG tablet TAKE 1 TABLET BY MOUTH EVERY 8 HOURS AS NEEDED FOR MUSCLE SPASM 45 tablet 2   naloxone (NARCAN) nasal spray 4 mg/0.1 mL Place 1 spray into the nose as needed (opioid overdose).     omeprazole (PRILOSEC) 20 MG capsule Take 1 capsule (20 mg total) by mouth daily. Take 30 minutes prior to a meal 90 capsule 3   ondansetron (ZOFRAN) 4 MG tablet Take 1 tablet (4  mg total) by mouth every 8 (eight) hours as needed for nausea or vomiting. 30 tablet 3   oxyCODONE (ROXICODONE) 15 MG immediate release tablet Take 1 tablet (15 mg total) by mouth every 4 (four) hours as needed for pain. 42 tablet 0   oxyCODONE-acetaminophen (PERCOCET) 10-325 MG tablet Take 1 tablet by mouth 4 (four) times daily as needed.     OZEMPIC, 2 MG/DOSE, 8 MG/3ML SOPN INJECT 2MG  INTO THE SKIN ONCE A WEEK 3 mL 0   Syringe/Needle, Disp, (SYRINGE 3CC/25GX1") 25G X 1" 3 ML MISC Use for monthly B12 injections 3 each 3   Turmeric (QC TUMERIC COMPLEX PO) Take 1 tablet by mouth daily.     XTAMPZA ER 18 MG C12A Take 18 mg by mouth every 12 (twelve) hours.     No current facility-administered medications on file prior to visit.    Review of Systems     Objective:   Vitals:   02/27/23 0913  BP: 132/80  Pulse: 83  Temp: 98.3 F (36.8 C)   SpO2: 98%   BP Readings from Last 3 Encounters:  02/27/23 132/80  02/06/23 130/78  12/05/22 120/80   Wt Readings from Last 3 Encounters:  02/27/23 203 lb (92.1 kg)  02/06/23 213 lb (96.6 kg)  12/05/22 212 lb (96.2 kg)   Body mass index is 33.27 kg/m.    Physical Exam Constitutional:      General: She is not in acute distress.    Appearance: Normal appearance. She is not ill-appearing.  HENT:     Head: Normocephalic and atraumatic.  Musculoskeletal:        General: Tenderness (Some tenderness with palpation lateral right lower back to just below ribs-states her current pain is different than her chronic back pain.  Increased pain with standing, sitting and moving in certain directions.) present.  Skin:    General: Skin is warm and dry.     Findings: No erythema or rash.  Neurological:     Mental Status: She is alert.            Assessment & Plan:    See Problem List for Assessment and Plan of chronic medical problems.

## 2023-02-27 NOTE — Patient Instructions (Addendum)
     B12 injection given       Start a medrol doseapak for the pain    See Dr Drucie Ip for further evaluation of your pain.

## 2023-02-27 NOTE — Assessment & Plan Note (Addendum)
Chronic Secondary to gastric bypass B12 injection today Continue monthly injections

## 2023-03-03 ENCOUNTER — Other Ambulatory Visit: Payer: Self-pay | Admitting: Internal Medicine

## 2023-03-05 ENCOUNTER — Other Ambulatory Visit: Payer: Self-pay | Admitting: Internal Medicine

## 2023-03-18 DIAGNOSIS — M25561 Pain in right knee: Secondary | ICD-10-CM | POA: Diagnosis not present

## 2023-03-18 DIAGNOSIS — M25551 Pain in right hip: Secondary | ICD-10-CM | POA: Diagnosis not present

## 2023-03-18 DIAGNOSIS — M545 Low back pain, unspecified: Secondary | ICD-10-CM | POA: Diagnosis not present

## 2023-03-18 DIAGNOSIS — G8929 Other chronic pain: Secondary | ICD-10-CM | POA: Diagnosis not present

## 2023-03-29 ENCOUNTER — Ambulatory Visit (INDEPENDENT_AMBULATORY_CARE_PROVIDER_SITE_OTHER): Payer: 59 | Admitting: Podiatry

## 2023-03-29 ENCOUNTER — Other Ambulatory Visit: Payer: Self-pay | Admitting: Internal Medicine

## 2023-03-29 ENCOUNTER — Encounter: Payer: Self-pay | Admitting: Podiatry

## 2023-03-29 DIAGNOSIS — N1832 Chronic kidney disease, stage 3b: Secondary | ICD-10-CM | POA: Diagnosis not present

## 2023-03-29 DIAGNOSIS — Q828 Other specified congenital malformations of skin: Secondary | ICD-10-CM

## 2023-03-29 DIAGNOSIS — D492 Neoplasm of unspecified behavior of bone, soft tissue, and skin: Secondary | ICD-10-CM | POA: Diagnosis not present

## 2023-03-29 NOTE — Progress Notes (Signed)
This patient presents to the office with chief complaint of painful callus right forefoot. She says this toe is very painful.  She is developing a callus on the outside of her right foot. She also has painful callus right forefoot. She presents to the office for preventative foot care services for callus care.    General Appearance  Alert, conversant and in no acute stress.  Vascular  Dorsalis pedis and posterior tibial  pulses are palpable  bilaterally.  Capillary return is within normal limits  bilaterally. Temperature is within normal limits  bilaterally.  Neurologic  Senn-Weinstein monofilament wire test within normal limits  bilaterally. Muscle power within normal limits bilaterally.  Nails Thick disfigured discolored nails with subungual debris  thick hallux nails  bilaterally. No evidence of bacterial infection or drainage bilaterally.  Orthopedic  No limitations of motion  feet .  No crepitus or effusions noted.  HAV  B/L.  Hammer toes 2-5  B/L especially  second toes both feet.  Fused IPJ right hallux. Plantar flexed metatarsal right foot.  Skin  normotropic skin noted bilaterally.  No signs of infections or ulcers noted.  Porokeratosis sub 3,5 right foot.  Porokeratosis right foot.  Hammer toes  B/L.  Debride porokeratosis with # 15 blade. And dremel tool. .  Patient requests consultation for surgical evaluation.   Helane Gunther DPM

## 2023-03-31 ENCOUNTER — Other Ambulatory Visit: Payer: Self-pay | Admitting: Internal Medicine

## 2023-04-03 ENCOUNTER — Telehealth: Payer: Self-pay | Admitting: Podiatry

## 2023-04-03 NOTE — Telephone Encounter (Signed)
 DOS-04/23/23  [employer] EFFECTIVE DATE- 03/20/23  DEDUCTIBLE- $257.00 WITH REMAINING $257.00 OOP-$9350.00 WITH REMAINING 9350.00 COINSURANCE- 20%  PER THE [employer] WEBSITE PORTAL, PRIOR AUTH IS NOT REQUIRED FOR CPT CODES [id number] AND 69629.  AUTH Decision ID #: [id number]

## 2023-04-18 ENCOUNTER — Encounter: Payer: Self-pay | Admitting: Internal Medicine

## 2023-04-18 NOTE — Progress Notes (Signed)
Subjective:    Patient ID: Misty [name], female    DOB: Nov 15, 1960, 63 y.o.   MRN: [id number]      HPI Misty [name] is here for a Physical exam and her chronic medical problems.    Having right foot surgery on Tuesday - has chronic callus.  This is throwing off her gait and she starting to get pain in the lateral aspect of her foot.  Medications and allergies reviewed with patient and updated if appropriate.  Current Outpatient Medications on File Prior to Visit  Medication Sig Dispense Refill   albuterol (PROAIR HFA) 108 (90 Base) MCG/ACT inhaler Inhale 1-2 puffs into the lungs every 6 (six) hours as needed for wheezing or shortness of breath. 1 each 5   atorvastatin (LIPITOR) 20 MG tablet Take 1 tablet (20 mg total) by mouth daily. 90 tablet 1   benzonatate (TESSALON) 200 MG capsule Take 1 capsule (200 mg total) by mouth 3 (three) times daily as needed for cough. 30 capsule 0   Cholecalciferol (VITAMIN D3) 50 MCG (2000 UT) TABS TAKE 1 TABLET BY MOUTH EVERY DAY 90 tablet 3   ciclopirox (PENLAC) 8 % solution Apply topically at bedtime. Apply over nail and surrounding skin. Apply daily over previous coat. After seven (7) days, may remove with alcohol and continue cycle. 6.6 mL 0   cyanocobalamin (,VITAMIN B-12,) 1000 MCG/ML injection Inject 1,000 mcg into the muscle every 30 (thirty) days.     diclofenac Sodium (VOLTAREN) 1 % GEL Apply 2 g topically 4 (four) times daily. APPLY 2 GRAMS TOPICALLY 4 TIMES DAILY 100 g 5   doxycycline (MONODOX) 100 MG capsule Take 1 capsule by mouth 2 (two) times daily.     ferrous gluconate (FERGON) 324 MG tablet Take 1 tablet (324 mg total) by mouth daily.  3   gabapentin (NEURONTIN) 300 MG capsule TAKE 3 CAPSULES BY MOUTH THREE TIMES DAILY 270 capsule 0   lisinopril (ZESTRIL) 20 MG tablet Take 1 tablet (20 mg total) by mouth daily. 90 tablet 2   MAXITROL 3.5-10000-0.1 OINT SMARTSIG:1 sparingly In Eye(s) Twice Daily     MELATONIN PO Take 1 tablet by  mouth as needed (sleep).     methocarbamol (ROBAXIN) 500 MG tablet TAKE 1 TABLET BY MOUTH EVERY 8 HOURS AS NEEDED FOR MUSCLE SPASM 45 tablet 2   naloxone (NARCAN) nasal spray 4 mg/0.1 mL Place 1 spray into the nose as needed (opioid overdose).     oxyCODONE (ROXICODONE) 15 MG immediate release tablet Take 1 tablet (15 mg total) by mouth every 4 (four) hours as needed for pain. 42 tablet 0   oxyCODONE-acetaminophen (PERCOCET) 10-325 MG tablet Take 1 tablet by mouth 4 (four) times daily as needed.     OZEMPIC, 2 MG/DOSE, 8 MG/3ML SOPN INJECT 2 MG  SUBCUTANEOUSLY ONCE A WEEK 3 mL 0   Syringe/Needle, Disp, (SYRINGE 3CC/25GX1") 25G X 1" 3 ML MISC Use for monthly B12 injections 3 each 3   Turmeric (QC TUMERIC COMPLEX PO) Take 1 tablet by mouth daily.     XTAMPZA ER 18 MG C12A Take 18 mg by mouth every 12 (twelve) hours.     No current facility-administered medications on file prior to visit.    Review of Systems  Constitutional:  Negative for fever.  Eyes:  Negative for visual disturbance.  Respiratory:  Negative for cough, shortness of breath and wheezing.   Cardiovascular:  Negative for chest pain, palpitations and leg swelling.  Gastrointestinal:  Negative  for abdominal pain, blood in stool, constipation and diarrhea.       Occ gerd  Genitourinary:  Negative for dysuria.  Musculoskeletal:  Negative for arthralgias and back pain.  Skin:  Negative for rash.  Neurological:  Negative for light-headedness and headaches.  Psychiatric/Behavioral:  Negative for dysphoric mood. The patient is not nervous/anxious.        Objective:   Vitals:   04/19/23 0810  BP: 118/70  Pulse: 78  Temp: 98 F (36.7 C)  SpO2: 98%   Filed Weights   04/19/23 0810  Weight: 207 lb (93.9 kg)   Body mass index is 33.92 kg/m.  BP Readings from Last 3 Encounters:  04/19/23 118/70  02/27/23 132/80  02/06/23 130/78    Wt Readings from Last 3 Encounters:  04/19/23 207 lb (93.9 kg)  02/27/23 203 lb (92.1  kg)  02/06/23 213 lb (96.6 kg)       Physical Exam Constitutional: She appears well-developed and well-nourished. No distress.  HENT:  Head: Normocephalic and atraumatic.  Right Ear: External ear normal. Normal ear canal and TM Left Ear: External ear normal.  Normal ear canal and TM Mouth/Throat: Oropharynx is clear and moist.  Eyes: Conjunctivae normal.  Neck: Neck supple. No tracheal deviation present. No thyromegaly present.  No carotid bruit  Cardiovascular: Normal rate, regular rhythm and normal heart sounds.   No murmur heard.  No edema. Pulmonary/Chest: Effort normal and breath sounds normal. No respiratory distress. She has no wheezes. She has no rales.  Breast: deferred   Abdominal: Soft. She exhibits no distension. There is no tenderness.  Lymphadenopathy: She has no cervical adenopathy.  Skin: Skin is warm and dry. She is not diaphoretic.  Psychiatric: She has a normal mood and affect. Her behavior is normal.     Lab Results  Component Value Date   WBC 5.2 04/19/2023   HGB 12.1 04/19/2023   HCT 37.9 04/19/2023   PLT 253.0 04/19/2023   GLUCOSE 64 (L) 04/19/2023   CHOL 182 04/19/2023   TRIG 97.0 04/19/2023   HDL 86.10 04/19/2023   LDLCALC 76 04/19/2023   ALT 11 04/19/2023   AST 16 04/19/2023   NA 142 04/19/2023   K 4.8 04/19/2023   CL 105 04/19/2023   CREATININE 0.80 04/19/2023   BUN 20 04/19/2023   CO2 29 04/19/2023   TSH 0.39 04/19/2023   INR 1.0 01/19/2021   HGBA1C 5.6 04/19/2023   MICROALBUR <0.7 04/11/2022         Assessment & Plan:   Physical exam: Screening blood work  ordered Exercise  none - limited by back and foot but tries to be active Weight  obese Substance abuse  none   Reviewed recommended immunizations.  Prevnar 20 today   Health Maintenance  Topic Date Due   COVID-19 Vaccine (4 - 2024-25 season) 05/05/2023 (Originally 02/02/2023)   Zoster Vaccines- Shingrix (1 of 2) 07/17/2023 (Originally 04/10/1980)   DTaP/Tdap/Td (1 -  Tdap) 04/18/2024 (Originally 04/10/1980)   Medicare Annual Wellness (AWV)  08/23/2023   Colonoscopy  01/18/2025   MAMMOGRAM  01/23/2025   Pneumococcal Vaccine 49-10 Years old  Completed   INFLUENZA VACCINE  Completed   Hepatitis C Screening  Completed   HIV Screening  Completed   HPV VACCINES  Aged Out          See Problem List for Assessment and Plan of chronic medical problems.

## 2023-04-18 NOTE — Patient Instructions (Addendum)
Pneumonia vaccine and B12 injection given    Blood work was ordered.       Medications changes include :   None      Return in about 6 months (around 10/17/2023) for follow up.   Health Maintenance, Female Adopting a healthy lifestyle and getting preventive care are important in promoting health and wellness. Ask your health care provider about: The right schedule for you to have regular tests and exams. Things you can do on your own to prevent diseases and keep yourself healthy. What should I know about diet, weight, and exercise? Eat a healthy diet  Eat a diet that includes plenty of vegetables, fruits, low-fat dairy products, and lean protein. Do not eat a lot of foods that are high in solid fats, added sugars, or sodium. Maintain a healthy weight Body mass index (BMI) is used to identify weight problems. It estimates body fat based on height and weight. Your health care provider can help determine your BMI and help you achieve or maintain a healthy weight. Get regular exercise Get regular exercise. This is one of the most important things you can do for your health. Most adults should: Exercise for at least 150 minutes each week. The exercise should increase your heart rate and make you sweat (moderate-intensity exercise). Do strengthening exercises at least twice a week. This is in addition to the moderate-intensity exercise. Spend less time sitting. Even light physical activity can be beneficial. Watch cholesterol and blood lipids Have your blood tested for lipids and cholesterol at 62 years of age, then have this test every 5 years. Have your cholesterol levels checked more often if: Your lipid or cholesterol levels are high. You are older than 62 years of age. You are at high risk for heart disease. What should I know about cancer screening? Depending on your health history and family history, you may need to have cancer screening at various ages. This may include  screening for: Breast cancer. Cervical cancer. Colorectal cancer. Skin cancer. Lung cancer. What should I know about heart disease, diabetes, and high blood pressure? Blood pressure and heart disease High blood pressure causes heart disease and increases the risk of stroke. This is more likely to develop in people who have high blood pressure readings or are overweight. Have your blood pressure checked: Every 3-5 years if you are 35-21 years of age. Every year if you are 68 years old or older. Diabetes Have regular diabetes screenings. This checks your fasting blood sugar level. Have the screening done: Once every three years after age 32 if you are at a normal weight and have a low risk for diabetes. More often and at a younger age if you are overweight or have a high risk for diabetes. What should I know about preventing infection? Hepatitis B If you have a higher risk for hepatitis B, you should be screened for this virus. Talk with your health care provider to find out if you are at risk for hepatitis B infection. Hepatitis C Testing is recommended for: Everyone born from [birth year] through 1964. Anyone with known risk factors for hepatitis C. Sexually transmitted infections (STIs) Get screened for STIs, including gonorrhea and chlamydia, if: You are sexually active and are younger than 62 years of age. You are older than 63 years of age and your health care provider tells you that you are at risk for this type of infection. Your sexual activity has changed since you were last screened, and you are  at increased risk for chlamydia or gonorrhea. Ask your health care provider if you are at risk. Ask your health care provider about whether you are at high risk for HIV. Your health care provider may recommend a prescription medicine to help prevent HIV infection. If you choose to take medicine to prevent HIV, you should first get tested for HIV. You should then be tested every 3 months for as  long as you are taking the medicine. Pregnancy If you are about to stop having your period (premenopausal) and you may become pregnant, seek counseling before you get pregnant. Take 400 to 800 micrograms (mcg) of folic acid every day if you become pregnant. Ask for birth control (contraception) if you want to prevent pregnancy. Osteoporosis and menopause Osteoporosis is a disease in which the bones lose minerals and strength with aging. This can result in bone fractures. If you are 27 years old or older, or if you are at risk for osteoporosis and fractures, ask your health care provider if you should: Be screened for bone loss. Take a calcium or vitamin D supplement to lower your risk of fractures. Be given hormone replacement therapy (HRT) to treat symptoms of menopause. Follow these instructions at home: Alcohol use Do not drink alcohol if: Your health care provider tells you not to drink. You are pregnant, may be pregnant, or are planning to become pregnant. If you drink alcohol: Limit how much you have to: 0-1 drink a day. Know how much alcohol is in your drink. In the [place] one drink equals one 12 oz bottle of beer (355 mL), one 5 oz glass of wine (148 mL), or one 1 oz glass of hard liquor (44 mL). Lifestyle Do not use any products that contain nicotine or tobacco. These products include cigarettes, chewing tobacco, and vaping devices, such as e-cigarettes. If you need help quitting, ask your health care provider. Do not use street drugs. Do not share needles. Ask your health care provider for help if you need support or information about quitting drugs. General instructions Schedule regular health, dental, and eye exams. Stay current with your vaccines. Tell your health care provider if: You often feel depressed. You have ever been abused or do not feel safe at home. Summary Adopting a healthy lifestyle and getting preventive care are important in promoting health and  wellness. Follow your health care provider's instructions about healthy diet, exercising, and getting tested or screened for diseases. Follow your health care provider's instructions on monitoring your cholesterol and blood pressure. This information is not intended to replace advice given to you by your health care provider. Make sure you discuss any questions you have with your health care provider. Document Revised: 07/25/2020 Document Reviewed: 07/25/2020 Elsevier Patient Education  2024 [employer].

## 2023-04-19 ENCOUNTER — Ambulatory Visit (INDEPENDENT_AMBULATORY_CARE_PROVIDER_SITE_OTHER): Payer: 59 | Admitting: Internal Medicine

## 2023-04-19 VITALS — BP 118/70 | HR 78 | Temp 98.0°F | Ht 65.5 in | Wt 207.0 lb

## 2023-04-19 DIAGNOSIS — Z0000 Encounter for general adult medical examination without abnormal findings: Secondary | ICD-10-CM

## 2023-04-19 DIAGNOSIS — G622 Polyneuropathy due to other toxic agents: Secondary | ICD-10-CM

## 2023-04-19 DIAGNOSIS — E538 Deficiency of other specified B group vitamins: Secondary | ICD-10-CM

## 2023-04-19 DIAGNOSIS — E7849 Other hyperlipidemia: Secondary | ICD-10-CM | POA: Diagnosis not present

## 2023-04-19 DIAGNOSIS — N1832 Chronic kidney disease, stage 3b: Secondary | ICD-10-CM | POA: Diagnosis not present

## 2023-04-19 DIAGNOSIS — K219 Gastro-esophageal reflux disease without esophagitis: Secondary | ICD-10-CM | POA: Insufficient documentation

## 2023-04-19 DIAGNOSIS — M48062 Spinal stenosis, lumbar region with neurogenic claudication: Secondary | ICD-10-CM

## 2023-04-19 DIAGNOSIS — I10 Essential (primary) hypertension: Secondary | ICD-10-CM | POA: Diagnosis not present

## 2023-04-19 DIAGNOSIS — R7303 Prediabetes: Secondary | ICD-10-CM | POA: Diagnosis not present

## 2023-04-19 DIAGNOSIS — E559 Vitamin D deficiency, unspecified: Secondary | ICD-10-CM

## 2023-04-19 DIAGNOSIS — Z23 Encounter for immunization: Secondary | ICD-10-CM | POA: Diagnosis not present

## 2023-04-19 LAB — LIPID PANEL
Cholesterol: 182 mg/dL (ref 0–200)
HDL: 86.1 mg/dL (ref >39.00)
LDL Cholesterol: 76 mg/dL (ref 0–99)
NonHDL: 95.83
Total CHOL/HDL Ratio: 2
Triglycerides: 97 mg/dL (ref 0.0–149.0)
VLDL: 19.4 mg/dL (ref 0.0–40.0)

## 2023-04-19 LAB — COMPREHENSIVE METABOLIC PANEL
ALT: 11 U/L (ref 0–35)
AST: 16 U/L (ref 0–37)
Albumin: 3.9 g/dL (ref 3.5–5.2)
Alkaline Phosphatase: 115 U/L (ref 39–117)
BUN: 20 mg/dL (ref 6–23)
CO2: 29 meq/L (ref 19–32)
Calcium: 9.5 mg/dL (ref 8.4–10.5)
Chloride: 105 meq/L (ref 96–112)
Creatinine, Ser: 0.8 mg/dL (ref 0.40–1.20)
GFR: 79.19 mL/min (ref >60.00)
Glucose, Bld: 64 mg/dL — ABNORMAL LOW (ref 70–99)
Potassium: 4.8 meq/L (ref 3.5–5.1)
Sodium: 142 meq/L (ref 135–145)
Total Bilirubin: 0.5 mg/dL (ref 0.2–1.2)
Total Protein: 6.9 g/dL (ref 6.0–8.3)

## 2023-04-19 LAB — CBC WITH DIFFERENTIAL/PLATELET
Basophils Absolute: 0 10*3/uL (ref 0.0–0.1)
Basophils Relative: 0.3 % (ref 0.0–3.0)
Eosinophils Absolute: 0.2 10*3/uL (ref 0.0–0.7)
Eosinophils Relative: 3 % (ref 0.0–5.0)
HCT: 37.9 % (ref 36.0–46.0)
Hemoglobin: 12.1 g/dL (ref 12.0–15.0)
Lymphocytes Relative: 32.7 % (ref 12.0–46.0)
Lymphs Abs: 1.7 10*3/uL (ref 0.7–4.0)
MCHC: 31.9 g/dL (ref 30.0–36.0)
MCV: 93.9 fL (ref 78.0–100.0)
Monocytes Absolute: 0.4 10*3/uL (ref 0.1–1.0)
Monocytes Relative: 7.5 % (ref 3.0–12.0)
Neutro Abs: 2.9 10*3/uL (ref 1.4–7.7)
Neutrophils Relative %: 56.5 % (ref 43.0–77.0)
Platelets: 253 10*3/uL (ref 150.0–400.0)
RBC: 4.03 Mil/uL (ref 3.87–5.11)
RDW: 13.3 % (ref 11.5–15.5)
WBC: 5.2 10*3/uL (ref 4.0–10.5)

## 2023-04-19 LAB — HEMOGLOBIN A1C: Hgb A1c MFr Bld: 5.6 % (ref 4.6–6.5)

## 2023-04-19 LAB — TSH: TSH: 0.39 u[IU]/mL (ref 0.35–5.50)

## 2023-04-19 LAB — VITAMIN D 25 HYDROXY (VIT D DEFICIENCY, FRACTURES): VITD: 24.38 ng/mL — ABNORMAL LOW (ref 30.00–100.00)

## 2023-04-19 MED ORDER — CYANOCOBALAMIN 1000 MCG/ML IJ SOLN
1000.0000 ug | Freq: Once | INTRAMUSCULAR | Status: AC
  Administered 2023-04-19: 1000 ug via INTRAMUSCULAR

## 2023-04-19 MED ORDER — ONDANSETRON HCL 4 MG PO TABS: 4.0000 mg | ORAL_TABLET | Freq: Three times a day (TID) | ORAL | 8 refills | Status: AC | PRN

## 2023-04-19 MED ORDER — OMEPRAZOLE 20 MG PO CPDR: 20.0000 mg | DELAYED_RELEASE_CAPSULE | Freq: Every day | ORAL | Status: AC

## 2023-04-19 NOTE — Assessment & Plan Note (Signed)
Chronic Lab Results  Component Value Date   HGBA1C 5.2 06/01/2022   Check a1c Low sugar / carb diet Stressed regular exercise

## 2023-04-19 NOTE — Assessment & Plan Note (Signed)
Chronic BP well controlled CBC, CMP Continue lisinopril 20 mg daily

## 2023-04-19 NOTE — Assessment & Plan Note (Signed)
Chronic Stinging/tingling sensation throughout body Continue monthly B12 injections Continue gabapentin 900 mg 3 times daily

## 2023-04-19 NOTE — Assessment & Plan Note (Signed)
 Chronic Secondary to gastric bypass B12 injection today Continue monthly injections

## 2023-04-19 NOTE — Assessment & Plan Note (Signed)
Chronic Check lipid panel, CMP, TSH Continue atorvastatin 20 mg daily Regular exercise and healthy diet encouraged

## 2023-04-19 NOTE — Assessment & Plan Note (Signed)
 Chronic Taking vitamin D daily Check vitamin D level

## 2023-04-19 NOTE — Assessment & Plan Note (Signed)
Chronic Improved and kidney function has been good recently Cmp BP well controlled

## 2023-04-19 NOTE — Assessment & Plan Note (Signed)
Chronic GERD not controlled but has been out of medication for a while -- has restarted it Continue omeprazole 20 mg daily - 30 prior to a meal Call if symptoms are not getting controlled

## 2023-04-19 NOTE — Assessment & Plan Note (Signed)
Chronic lower back pain Following with pain management  Takes pain medication as needed Continue methocarbamol 500 mg every 8 hours as needed Continue gabapentin 900 mg 3 times daily Continue TENS unit, pain patches

## 2023-04-21 ENCOUNTER — Encounter: Payer: Self-pay | Admitting: Internal Medicine

## 2023-04-22 MED ORDER — HYDROCODONE-ACETAMINOPHEN 10-325 MG PO TABS: 1.0000 | ORAL_TABLET | Freq: Four times a day (QID) | ORAL | 0 refills | Status: AC | PRN

## 2023-04-22 NOTE — Addendum Note (Signed)
Addended by: Lenn Sink on: 04/22/2023 02:57 PM   Modules accepted: Orders

## 2023-04-23 DIAGNOSIS — D2371 Other benign neoplasm of skin of right lower limb, including hip: Secondary | ICD-10-CM

## 2023-04-23 DIAGNOSIS — M21541 Acquired clubfoot, right foot: Secondary | ICD-10-CM

## 2023-04-23 DIAGNOSIS — M2041 Other hammer toe(s) (acquired), right foot: Secondary | ICD-10-CM

## 2023-04-24 ENCOUNTER — Other Ambulatory Visit: Payer: Self-pay | Admitting: Internal Medicine

## 2023-04-29 ENCOUNTER — Ambulatory Visit (INDEPENDENT_AMBULATORY_CARE_PROVIDER_SITE_OTHER): Payer: 59

## 2023-04-29 ENCOUNTER — Ambulatory Visit (INDEPENDENT_AMBULATORY_CARE_PROVIDER_SITE_OTHER): Payer: 59 | Admitting: Podiatry

## 2023-04-29 ENCOUNTER — Telehealth: Payer: Self-pay | Admitting: Podiatry

## 2023-04-29 DIAGNOSIS — Z9889 Other specified postprocedural states: Secondary | ICD-10-CM

## 2023-04-29 DIAGNOSIS — M2041 Other hammer toe(s) (acquired), right foot: Secondary | ICD-10-CM

## 2023-04-29 MED ORDER — HYDROCODONE-ACETAMINOPHEN 10-325 MG PO TABS: 1.0000 | ORAL_TABLET | Freq: Three times a day (TID) | ORAL | 0 refills | Status: AC | PRN

## 2023-04-29 NOTE — Telephone Encounter (Signed)
 Pt called and left message stating the medication that was to have been called into [facility] [address] and they did not have a prescription for her. Please call pt to advise.

## 2023-04-29 NOTE — Telephone Encounter (Signed)
 Pt was transferred to me by accident regarding her pain medication. Pt stated she called [facility] [address] and I informed pt the medication was sent to the [facility] on [address]. Pt stated she would figure out to get someone to pick up her Rx for her.

## 2023-04-30 ENCOUNTER — Other Ambulatory Visit: Payer: Self-pay | Admitting: Internal Medicine

## 2023-04-30 NOTE — Progress Notes (Signed)
Subjective:   Patient ID: Misty [name], female   DOB: 63 y.o.   MRN: [id number]   HPI Patient states overall doing well with moderate discomfort still and dressing where there was some bleeding during the postop.   ROS      Objective:  Physical Exam  Neurovascular status intact negative Denna Haggard' sign noted there was some crusted blood on the dressing which was removed and found there to be good healing of the incision sites pin in place second and third digits     Assessment:  Patient overall doing well from multiple foot surgery right     Plan:  H&P x-ray reviewed sterile dressing reapplied.  Did write her for another dose of hydrocodone due to the extensive amount of surgery and the crusted blood on the dressing.  Advised on continued elevation compression and reappoint again 2 weeks earlier if needed  X-rays indicate pins are intact screws intact alignment good at this time

## 2023-05-13 ENCOUNTER — Ambulatory Visit (INDEPENDENT_AMBULATORY_CARE_PROVIDER_SITE_OTHER): Payer: 59 | Admitting: Podiatry

## 2023-05-13 ENCOUNTER — Other Ambulatory Visit: Payer: Self-pay | Admitting: Internal Medicine

## 2023-05-13 ENCOUNTER — Ambulatory Visit (INDEPENDENT_AMBULATORY_CARE_PROVIDER_SITE_OTHER): Payer: 59

## 2023-05-13 ENCOUNTER — Encounter: Payer: Self-pay | Admitting: Podiatry

## 2023-05-13 DIAGNOSIS — Z9889 Other specified postprocedural states: Secondary | ICD-10-CM

## 2023-05-13 DIAGNOSIS — M2041 Other hammer toe(s) (acquired), right foot: Secondary | ICD-10-CM

## 2023-05-13 NOTE — Progress Notes (Signed)
 Subjective:   Patient ID: Misty [name], female   DOB: 63 y.o.   MRN: [id number]   HPI Patient states doing well with right foot minimal discomfort third toe bothers me some but overall doing well   ROS      Objective:  Physical Exam  Neurovascular status intact negative Denna Haggard' sign noted the right PIN fourth digit has moved out and is loose the other 2 pins are in excellent position mild elevation of the digits but much better with incision sites intact stitches intact     Assessment:  Over all doing well with forefoot reconstruction right with pin that may have been traumatized in the fourth digit     Plan:  H&P x-ray reviewed pin removed fourth digit right with sterile dressing applied removed all stitches at this time sterile dressings applied continue elevation compression immobilization reappoint 2 weeks pin removal  X-rays indicate good alignment pins are intact mild elevation of the toes but they were severely deformed preoperatively and we will continue to plantarflex the second digit

## 2023-05-22 ENCOUNTER — Other Ambulatory Visit: Payer: Self-pay | Admitting: Internal Medicine

## 2023-05-23 ENCOUNTER — Ambulatory Visit: Admitting: Family Medicine

## 2023-05-23 ENCOUNTER — Telehealth: Payer: Self-pay

## 2023-05-23 ENCOUNTER — Ambulatory Visit: Payer: Self-pay | Admitting: Internal Medicine

## 2023-05-23 ENCOUNTER — Ambulatory Visit: Admitting: Nurse Practitioner

## 2023-05-23 NOTE — Telephone Encounter (Signed)
 Chief Complaint: Depression Symptoms: Not sleeping Pertinent Negatives: Patient denies suicidal or homicidal ideations Disposition: [] ED /[] Urgent Care (no appt availability in office) / [x] Appointment(In office/virtual)/ []  [place] [facility]/ [] Home Care/ [] Refused Recommended Disposition /[] [place] [facility]/ []  Follow-up with PCP Additional Notes: Pt states she has not been sleeping as her brother just passed away and the funeral is on Saturday. Pt states in the past Dr. Lawerance Bach prescribed her something for depression when her son passed away. Pt would like to be prescribed something again. No available appts with PCP until Fri. Pt is not able to come into office as she does not have a ride. Virtual appt scheduled for today so she can speak with someone. This RN educated pt on home care, new-worsening symptoms, when to call back/seek emergent care. Pt verbalized understanding and agrees to plan.    Copied from CRM [id number]. Topic: Clinical - Red Word Triage >> May 23, 2023 10:40 AM Theodis Sato wrote: Red Word that prompted transfer to Nurse Triage: Depression and not sleeping, seeking medication from Dr. Lawerance Bach to help. Reason for Disposition  [1] Depression AND [2] worsening (e.g., sleeping poorly, less able to do activities of daily living)  Answer Assessment - Initial Assessment Questions 1. CONCERN: "What happened that made you call today?"     Not sleeping, brother passed and the funeral is Saturday. Dr Lawerance Bach prescribed pt medication when she lost son 2. DEPRESSION SYMPTOM SCREENING: "How are you feeling overall?" (e.g., decreased energy, increased sleeping or difficulty sleeping, difficulty concentrating, feelings of sadness, guilt, hopelessness, or worthlessness)     Oldest brother passed away, mom is dealing with Alzheimers, oldest brother's son was shot and killed 3. RISK OF HARM - SUICIDAL IDEATION:  "Do you ever have thoughts of hurting or killing yourself?"  (e.g., yes, no,  no but preoccupation with thoughts about death)   - INTENT:  "Do you have thoughts of hurting or killing yourself right NOW?" (e.g., yes, no, N/A)   - PLAN: "Do you have a specific plan for how you would do this?" (e.g., gun, knife, overdose, no plan, N/A)     Denies 4. RISK OF HARM - HOMICIDAL IDEATION:  "Do you ever have thoughts of hurting or killing someone else?"  (e.g., yes, no, no but preoccupation with thoughts about death)   - INTENT:  "Do you have thoughts of hurting or killing someone right NOW?" (e.g., yes, no, N/A)   - PLAN: "Do you have a specific plan for how you would do this?" (e.g., gun, knife, no plan, N/A)      Denies  Protocols used: Depression-A-AH

## 2023-05-23 NOTE — Telephone Encounter (Signed)
 Copied from CRM [id number]. Topic: Clinical - Medication Question >> May 23, 2023 12:02 PM Sonny Dandy B wrote: Reason for CRM: pt called to request provider to call in some medication to clam he. Pt states her brother passed away. She needs something so she can sleep. Please call pt back at [phone]

## 2023-05-27 ENCOUNTER — Ambulatory Visit (INDEPENDENT_AMBULATORY_CARE_PROVIDER_SITE_OTHER): Payer: 59 | Admitting: Podiatry

## 2023-05-27 ENCOUNTER — Ambulatory Visit (INDEPENDENT_AMBULATORY_CARE_PROVIDER_SITE_OTHER)

## 2023-05-27 ENCOUNTER — Other Ambulatory Visit: Payer: Self-pay | Admitting: Podiatry

## 2023-05-27 DIAGNOSIS — M2041 Other hammer toe(s) (acquired), right foot: Secondary | ICD-10-CM | POA: Diagnosis not present

## 2023-05-27 MED ORDER — ALPRAZOLAM 0.5 MG PO TABS: 0.5000 mg | ORAL_TABLET | Freq: Every evening | ORAL | 0 refills | Status: AC | PRN

## 2023-05-27 NOTE — Telephone Encounter (Signed)
 Does pt need appt for medication to be sent?

## 2023-05-27 NOTE — Telephone Encounter (Signed)
 Called and advised pt of rx sent, pt verbalized understanding

## 2023-05-27 NOTE — Telephone Encounter (Signed)
 I am sorry for her loss.   I will prescribe her a small supply of xanax for nighttime.  This is temporary - she is on the pain medication so she should not be taking this long term.   Rx sent to pharmacy

## 2023-05-27 NOTE — Addendum Note (Signed)
 Addended by: Pincus Sanes on: 05/27/2023 03:10 PM   Modules accepted: Orders

## 2023-05-28 NOTE — Progress Notes (Signed)
 Subjective:   Patient ID: Misty [name], female   DOB: 63 y.o.   MRN: [id number]   HPI Patient presents with well-healing surgical site right for pin removal   ROS      Objective:  Physical Exam  Neuro vascular status intact toes in good alignment incision sites healing well patient very pleased negative Denna Haggard' sign noted     Assessment:  Doing well post digital fusion good alignment     Plan:  H&P pins removed sterile dressing x-rays taken May begin soft shoe gear usage in the next week reappoint to recheck  X-rays indicate everything looks good the alignment is good with no other sign of pathology

## 2023-05-30 ENCOUNTER — Other Ambulatory Visit: Payer: Self-pay | Admitting: Internal Medicine

## 2023-06-11 ENCOUNTER — Encounter: Payer: Self-pay | Admitting: Podiatry

## 2023-06-11 ENCOUNTER — Ambulatory Visit (INDEPENDENT_AMBULATORY_CARE_PROVIDER_SITE_OTHER): Payer: 59 | Admitting: Podiatry

## 2023-06-11 DIAGNOSIS — Z9889 Other specified postprocedural states: Secondary | ICD-10-CM

## 2023-06-11 DIAGNOSIS — M2041 Other hammer toe(s) (acquired), right foot: Secondary | ICD-10-CM

## 2023-06-11 NOTE — Progress Notes (Signed)
 This patient presents to the office with chief complaint of her surgical right foot not healing right.  She was originally my patient with a painful callus.  She had surgery by Dr.  Charlsie Merles.  She now has swollen toes, pain on the outside of her right foot, and toes that she is unable to move since the surgery.  She presents for evaluation and treatment.  She request phyical therapy to help her heal.    General Appearance  Alert, conversant and in no acute stress.  Vascular  Dorsalis pedis and posterior tibial  pulses are palpable  bilaterally.  Capillary return is within normal limits  bilaterally. Temperature is within normal limits  bilaterally.  Neurologic  Senn-Weinstein monofilament wire test within normal limits  bilaterally. Muscle power within normal limits bilaterally.  Nails Thick disfigured discolored nails with subungual debris  thick hallux nails  bilaterally. No evidence of bacterial infection or drainage bilaterally.  Orthopedic  No limitations of motion  feet .  No crepitus or effusions noted.  HAV  B/L. Fused IPJ right hallux. Plantar flexed metatarsal right foot. Healing 2,3 and 4 toes right foot.    Skin  normotropic skin noted bilaterally.  No signs of infections or ulcers noted.  Porokeratosis sub 3,5 right are resolving.  Shin healing toes right foot.  Thickness noted head 2,3 metatarsal heads with limited ROM  MPJ.  Post-op visit right foot surgery.  Hammer toes surgery right foot.  ROV post-op.  Discussed condition with patient.  Told her the bone is thick as part of normal bone healing post-op.  Her toes will regain motion.  She still wants to be seen by another surgical doctor.  Patient  to be seen by Dr.  Allena Katz.   Helane Gunther DPM

## 2023-06-14 ENCOUNTER — Other Ambulatory Visit: Payer: Self-pay | Admitting: Internal Medicine

## 2023-06-21 ENCOUNTER — Ambulatory Visit (INDEPENDENT_AMBULATORY_CARE_PROVIDER_SITE_OTHER): Admitting: Podiatry

## 2023-06-21 ENCOUNTER — Encounter: Payer: Self-pay | Admitting: Podiatry

## 2023-06-21 DIAGNOSIS — M6240 Contracture of muscle, unspecified site: Secondary | ICD-10-CM

## 2023-06-21 DIAGNOSIS — M19071 Primary osteoarthritis, right ankle and foot: Secondary | ICD-10-CM

## 2023-06-21 NOTE — Progress Notes (Signed)
 Subjective:  Patient ID: Misty [name], female    DOB: [birth year],  MRN: [id number]  Chief Complaint  Patient presents with   Foot Pain    Right foot - previous surgery with Dr. Celia Coles - DOS 04/23/23, 1.FUSION WITH PIN 2,3,4 (R) 2.SHORTENING OSTEOTOMY WITH [id number] 3.REMOVAL LESION PLANTAR (R)  - she is having pain dorsal midfoot right radiating into toes, swelling, can't wear a shoe, would like PT to help, but Dr. Celia Coles said PT wasn't needed, wanted 2nd opinion, currently having to walk on the side of her foot    63 y.o. female presents with the above complaint.  Patient presents with complaint of top of her foot pain.  She states it hurts with ambulation is with pressure.  Patient states that she is having some dorsal midfoot pain.  She has a history of arthritis she would like to discuss steroid injection for that.  She states that she also had surgery done by Dr. Celia Coles for hammertoe procedure and would like to discuss if this is normal.  She thinks she may need physical therapy to help with the range of motion of the tendon.   Review of Systems: Negative except as noted in the HPI. Denies N/V/F/Ch.  Past Medical History:  Diagnosis Date   Allergy    Arthritis    Back pain    Colon cancer (HCC)    Depression    Diarrhea    GERD (gastroesophageal reflux disease)    Hypertension    Insomnia    MS (multiple sclerosis) (HCC) 2000   pt says that she doesn't have it   Other hammer toe (acquired) 03/31/2013   Pneumonia    hx of   Pre-diabetes    Umbilical hernia     Current Outpatient Medications:    albuterol (PROAIR HFA) 108 (90 Base) MCG/ACT inhaler, Inhale 1-2 puffs into the lungs every 6 (six) hours as needed for wheezing or shortness of breath., Disp: 1 each, Rfl: 5   ALPRAZolam (XANAX) 0.5 MG tablet, Take 1 tablet (0.5 mg total) by mouth at bedtime as needed for anxiety., Disp: 20 tablet, Rfl: 0   atorvastatin (LIPITOR) 20 MG tablet, Take 1 tablet by mouth once daily, Disp:  90 tablet, Rfl: 0   Cholecalciferol (VITAMIN D3) 50 MCG (2000 UT) TABS, TAKE 1 TABLET BY MOUTH EVERY DAY, Disp: 90 tablet, Rfl: 3   cyanocobalamin (,VITAMIN B-12,) 1000 MCG/ML injection, Inject 1,000 mcg into the muscle every 30 (thirty) days., Disp: , Rfl:    diclofenac Sodium (VOLTAREN) 1 % GEL, Apply 2 g topically 4 (four) times daily. APPLY 2 GRAMS TOPICALLY 4 TIMES DAILY, Disp: 100 g, Rfl: 5   ferrous gluconate (FERGON) 324 MG tablet, Take 1 tablet (324 mg total) by mouth daily., Disp: , Rfl: 3   gabapentin (NEURONTIN) 300 MG capsule, Take 3 capsules (900 mg total) by mouth 3 (three) times daily., Disp: 270 capsule, Rfl: 0   lisinopril (ZESTRIL) 20 MG tablet, Take 1 tablet (20 mg total) by mouth daily., Disp: 90 tablet, Rfl: 2   MAXITROL 3.5-10000-0.1 OINT, SMARTSIG:1 sparingly In Eye(s) Twice Daily, Disp: , Rfl:    MELATONIN PO, Take 1 tablet by mouth as needed (sleep)., Disp: , Rfl:    methocarbamol (ROBAXIN) 500 MG tablet, TAKE 1 TABLET BY MOUTH EVERY 8 HOURS AS NEEDED FOR MUSCLE SPASM, Disp: 45 tablet, Rfl: 2   naloxone (NARCAN) nasal spray 4 mg/0.1 mL, Place 1 spray into the nose as needed (opioid overdose)., Disp: ,  Rfl:    omeprazole (PRILOSEC) 20 MG capsule, Take 1 capsule (20 mg total) by mouth daily. Take 1 capsule by mouth 30 minutes prior to a meal once a day, Disp: , Rfl:    ondansetron (ZOFRAN) 4 MG tablet, Take 1 tablet (4 mg total) by mouth every 8 (eight) hours as needed for nausea or vomiting., Disp: 30 tablet, Rfl: 8   oxyCODONE (ROXICODONE) 15 MG immediate release tablet, Take 1 tablet (15 mg total) by mouth every 4 (four) hours as needed for pain., Disp: 42 tablet, Rfl: 0   oxyCODONE-acetaminophen (PERCOCET) 10-325 MG tablet, Take 1 tablet by mouth 4 (four) times daily as needed., Disp: , Rfl:    Semaglutide, 2 MG/DOSE, (OZEMPIC, 2 MG/DOSE,) 8 MG/3ML SOPN, INJECT 2MG  SUBCUTANEOUSLY ONCE A WEEK, Disp: 3 mL, Rfl: 4   Syringe/Needle, Disp, (SYRINGE 3CC/25GX1") 25G X 1" 3 ML  MISC, Use for monthly B12 injections, Disp: 3 each, Rfl: 3   Turmeric (QC TUMERIC COMPLEX PO), Take 1 tablet by mouth daily., Disp: , Rfl:    XTAMPZA ER 18 MG C12A, Take 18 mg by mouth every 12 (twelve) hours., Disp: , Rfl:   Social History   Tobacco Use  Smoking Status Former   Current packs/day: 0.00   Average packs/day: 0.3 packs/day for 20.0 years (5.0 ttl pk-yrs)   Types: Cigarettes   Start date: 09/22/1998   Quit date: 09/22/2018   Years since quitting: 4.7  Smokeless Tobacco Never  Tobacco Comments   states she quit 3 months ago    Allergies  Allergen Reactions   Phenergan [Promethazine Hcl] Anxiety   Objective:  There were no vitals filed for this visit. There is no height or weight on file to calculate BMI. Constitutional Well developed. Well nourished.  Vascular Dorsalis pedis pulses palpable bilaterally. Posterior tibial pulses palpable bilaterally. Capillary refill normal to all digits.  No cyanosis or clubbing noted. Pedal hair growth normal.  Neurologic Normal speech. Oriented to person, place, and time. Epicritic sensation to light touch grossly present bilaterally.  Dermatologic Nails well groomed and normal in appearance. No open wounds. No skin lesions.  Orthopedic: Pain on palpation right dorsal midfoot pain with range of motion of the tarsometatarsal joints.  Some weakness noted to the extensor tendon.  Clinically able to appreciate the arthritis at the dorsal midfoot joints.   Radiographs: None Assessment:  No diagnosis found. Plan:  Patient was evaluated and treated and all questions answered.  Right dorsal midfoot arthritis - All questions and concerns were discussed with the patient in extensive detail given the amount of pain that she is having should benefit from steroid injection of decreasing inflammatory component associate with pain.  Patient agrees with plan like to proceed with steroid injection -A steroid injection was performed at right  dorsal midfoot using 1% plain Lidocaine and 10 mg of Kenalog. This was well tolerated.   Tendon tightness of the extensor tendon with history of surgery -I discussed with the patient she will benefit from physical therapy she states understanding agrees with the plan -Prescription for physical therapy was given

## 2023-06-29 ENCOUNTER — Other Ambulatory Visit: Payer: Self-pay | Admitting: Internal Medicine

## 2023-07-01 ENCOUNTER — Other Ambulatory Visit: Payer: Self-pay | Admitting: Internal Medicine

## 2023-07-01 MED ORDER — GABAPENTIN 300 MG PO CAPS: 900.0000 mg | ORAL_CAPSULE | Freq: Three times a day (TID) | ORAL | 0 refills | Status: DC

## 2023-07-15 ENCOUNTER — Other Ambulatory Visit: Payer: Self-pay | Admitting: Internal Medicine

## 2023-08-01 ENCOUNTER — Ambulatory Visit

## 2023-08-01 DIAGNOSIS — E538 Deficiency of other specified B group vitamins: Secondary | ICD-10-CM | POA: Diagnosis not present

## 2023-08-01 MED ORDER — CYANOCOBALAMIN 1000 MCG/ML IJ SOLN
1000.0000 ug | Freq: Once | INTRAMUSCULAR | Status: AC
  Administered 2023-08-01: 1000 ug via INTRAMUSCULAR

## 2023-08-01 NOTE — Progress Notes (Signed)
Pt was given B12 injection with no complications.

## 2023-08-14 ENCOUNTER — Other Ambulatory Visit: Payer: Self-pay | Admitting: Internal Medicine

## 2023-08-19 ENCOUNTER — Ambulatory Visit (INDEPENDENT_AMBULATORY_CARE_PROVIDER_SITE_OTHER): Admitting: Podiatry

## 2023-08-19 ENCOUNTER — Encounter: Payer: Self-pay | Admitting: Podiatry

## 2023-08-19 DIAGNOSIS — Z9889 Other specified postprocedural states: Secondary | ICD-10-CM | POA: Diagnosis not present

## 2023-08-19 DIAGNOSIS — N1832 Chronic kidney disease, stage 3b: Secondary | ICD-10-CM

## 2023-08-19 DIAGNOSIS — Q828 Other specified congenital malformations of skin: Secondary | ICD-10-CM

## 2023-08-19 NOTE — Progress Notes (Signed)
 This patient presents to the office with chief complaint of her surgical right foot right.  She was originally my patient with a painful callus.  She had surgery by Dr.  Celia Coles.  She now has healing toes, pain on the outside of her right foot, and toes that are limited in motion. since the surgery.  She presents for evaluation and treatment.  She was seen by Dr.  Lydia Sams who treated her with injection therapy as well as PT.   General Appearance  Alert, conversant and in no acute stress.  Vascular  Dorsalis pedis and posterior tibial  pulses are palpable  bilaterally.  Capillary return is within normal limits  bilaterally. Temperature is within normal limits  bilaterally.  Neurologic  Senn-Weinstein monofilament wire test within normal limits  bilaterally. Muscle power within normal limits bilaterally.  Nails Thick disfigured discolored nails with subungual debris  thick hallux nails  bilaterally. No evidence of bacterial infection or drainage bilaterally.  Orthopedic  No limitations of motion  feet .  No crepitus or effusions noted.  HAV  B/L. Fused IPJ right hallux. Plantar flexed metatarsal right foot. Healing 2,3 and 4 toes right foot.  With better alignment.  Skin  normotropic skin noted bilaterally.  No signs of infections or ulcers noted.  Porokeratosis sub 3,5 right are resolving. Better ROM mpj right foot.     Porokeratosis sub 5th met right foot.Post-op visit right foot surgery.  Hammer toes surgery right foot.  ROV post-op.  Discussed condition with patient.  Told her the bone is thick as part of normal bone healing post-op.  Her toes will regain motion.  She has developed a transfer lesion sub 5th met right foot. Debride porokeratosis sub 5th met right foot. With # 15 blade and dremel tool.   Ruffin Cotton DPM

## 2023-08-28 ENCOUNTER — Ambulatory Visit: Payer: 59

## 2023-08-28 VITALS — Ht 65.5 in | Wt 202.0 lb

## 2023-08-28 DIAGNOSIS — Z0000 Encounter for general adult medical examination without abnormal findings: Secondary | ICD-10-CM

## 2023-08-28 NOTE — Patient Instructions (Signed)
 [name] , Thank you for taking time out of your busy schedule to complete your Annual Wellness Visit with me. I enjoyed our conversation and look forward to speaking with you again next year. I, as well as your care team,  appreciate your ongoing commitment to your health goals. Please review the following plan we discussed and let me know if I can assist you in the future. Your Game plan/ To Do List    Follow up Visits: Next Medicare AWV with our clinical staff: 08/31/2024   Have you seen your provider in the last 6 months (3 months if uncontrolled diabetes)? Yes Next Office Visit with your provider: 10/17/2023  Clinician Recommendations:  Aim for 30 minutes of exercise or brisk walking, 6-8 glasses of water , and 5 servings of fruits and vegetables each day. Educated and advised on getting the COVID and Shingles vaccines in 2025.      This is a list of the screening recommended for you and due dates:  Health Maintenance  Topic Date Due   Zoster (Shingles) Vaccine (1 of 2) Never done   COVID-19 Vaccine (4 - Pfizer risk 2024-25 season) 06/07/2023   DTaP/Tdap/Td vaccine (1 - Tdap) 04/18/2024*   Flu Shot  10/18/2023   Medicare Annual Wellness Visit  08/27/2024   Colon Cancer Screening  01/18/2025   Mammogram  01/23/2025   Pneumococcal Vaccination  Completed   Hepatitis C Screening  Completed   HIV Screening  Completed   HPV Vaccine  Aged Out   Meningitis B Vaccine  Aged Out  *Topic was postponed. The date shown is not the original due date.    Advanced directives: (Declined) Advance directive discussed with you today. Even though you declined this today, please call our office should you change your mind, and we can give you the proper paperwork for you to fill out. Advance Care Planning is important because it:  [x]  Makes sure you receive the medical care that is consistent with your values, goals, and preferences  [x]  It provides guidance to your family and loved ones and reduces  their decisional burden about whether or not they are making the right decisions based on your wishes.  Follow the link provided in your after visit summary or read over the paperwork we have mailed to you to help you started getting your Advance Directives in place. If you need assistance in completing these, please reach out to us  so that we can help you!   Managing Pain Without Opioids Opioids are strong medicines used to treat moderate to severe pain. For some people, especially those who have long-term (chronic) pain, opioids may not be the best choice for pain management due to: Side effects like nausea, constipation, and sleepiness. The risk of addiction (opioid use disorder). The longer you take opioids, the greater your risk of addiction. Pain that lasts for more than 3 months is called chronic pain. Managing chronic pain usually requires more than one approach and is often provided by a team of health care providers working together (multidisciplinary approach). Pain management may be done at a pain management center or pain clinic. How to manage pain without the use of opioids Use non-opioid medicines Non-opioid medicines for pain may include: Over-the-counter or prescription non-steroidal anti-inflammatory drugs (NSAIDs). These may be the first medicines used for pain. They work well for muscle and bone pain, and they reduce swelling. Acetaminophen . This over-the-counter medicine may work well for milder pain but not swelling. Antidepressants. These may be  used to treat chronic pain. A certain type of antidepressant (tricyclics) is often used. These medicines are given in lower doses for pain than when used for depression. Anticonvulsants. These are usually used to treat seizures but may also reduce nerve (neuropathic) pain. Muscle relaxants. These relieve pain caused by sudden muscle tightening (spasms). You may also use a pain medicine that is applied to the skin as a patch, cream, or  gel (topical analgesic), such as a numbing medicine. These may cause fewer side effects than medicines taken by mouth. Do certain therapies as directed Some therapies can help with pain management. They include: Physical therapy. You will do exercises to gain strength and flexibility. A physical therapist may teach you exercises to move and stretch parts of your body that are weak, stiff, or painful. You can learn these exercises at physical therapy visits and practice them at home. Physical therapy may also involve: Massage. Heat wraps or applying heat or cold to affected areas. Electrical signals that interrupt pain signals (transcutaneous electrical nerve stimulation, TENS). Weak lasers that reduce pain and swelling (low-level laser therapy). Signals from your body that help you learn to regulate pain (biofeedback). Occupational therapy. This helps you to learn ways to function at home and work with less pain. Recreational therapy. This involves trying new activities or hobbies, such as a physical activity or drawing. Mental health therapy, including: Cognitive behavioral therapy (CBT). This helps you learn coping skills for dealing with pain. Acceptance and commitment therapy (ACT) to change the way you think and react to pain. Relaxation therapies, including muscle relaxation exercises and mindfulness-based stress reduction. Pain management counseling. This may be individual, family, or group counseling.  Receive medical treatments Medical treatments for pain management include: Nerve block injections. These may include a pain blocker and anti-inflammatory medicines. You may have injections: Near the spine to relieve chronic back or neck pain. Into joints to relieve back or joint pain. Into nerve areas that supply a painful area to relieve body pain. Into muscles (trigger point injections) to relieve some painful muscle conditions. A medical device placed near your spine to help block  pain signals and relieve nerve pain or chronic back pain (spinal cord stimulation device). Acupuncture. Follow these instructions at home Medicines Take over-the-counter and prescription medicines only as told by your health care provider. If you are taking pain medicine, ask your health care providers about possible side effects to watch out for. Do not drive or use heavy machinery while taking prescription opioid pain medicine. Lifestyle  Do not use drugs or alcohol  to reduce pain. If you drink alcohol , limit how much you have to: 0-1 drink a day for women who are not pregnant. 0-2 drinks a day for men. Know how much alcohol  is in a drink. In the [place] one drink equals one 12 oz bottle of beer (355 mL), one 5 oz glass of wine (148 mL), or one 1 oz glass of hard liquor (44 mL). Do not use any products that contain nicotine or tobacco. These products include cigarettes, chewing tobacco, and vaping devices, such as e-cigarettes. If you need help quitting, ask your health care provider. Eat a healthy diet and maintain a healthy weight. Poor diet and excess weight may make pain worse. Eat foods that are high in fiber. These include fresh fruits and vegetables, whole grains, and beans. Limit foods that are high in fat and processed sugars, such as fried and sweet foods. Exercise regularly. Exercise lowers stress and may help  relieve pain. Ask your health care provider what activities and exercises are safe for you. If your health care provider approves, join an exercise class that combines movement and stress reduction. Examples include yoga and tai chi. Get enough sleep. Lack of sleep may make pain worse. Lower stress as much as possible. Practice stress reduction techniques as told by your therapist. General instructions Work with all your pain management providers to find the treatments that work best for you. You are an important member of your pain management team. There are many things you  can do to reduce pain on your own. Consider joining an online or in-person support group for people who have chronic pain. Keep all follow-up visits. This is important. Where to find more information You can find more information about managing pain without opioids from: American Academy of Pain Medicine: painmed.org Institute for Chronic Pain: instituteforchronicpain.org American Chronic Pain Association: theacpa.org Contact a health care provider if: You have side effects from pain medicine. Your pain gets worse or does not get better with treatments or home therapy. You are struggling with anxiety or depression. Summary Many types of pain can be managed without opioids. Chronic pain may respond better to pain management without opioids. Pain is best managed when you and a team of health care providers work together. Pain management without opioids may include non-opioid medicines, medical treatments, physical therapy, mental health therapy, and lifestyle changes. Tell your health care providers if your pain gets worse or is not being managed well enough. This information is not intended to replace advice given to you by your health care provider. Make sure you discuss any questions you have with your health care provider. Document Revised: 06/15/2020 Document Reviewed: 06/15/2020 Elsevier Patient Education  2024 [employer].

## 2023-08-28 NOTE — Progress Notes (Signed)
 Subjective:   Misty [name] is a 63 y.o. who presents for a Medicare Wellness preventive visit.  As a reminder, Annual Wellness Visits don't include a physical exam, and some assessments may be limited, especially if this visit is performed virtually. We may recommend an in-person follow-up visit with your provider if needed.  Visit Complete: Virtual I connected with  Misty [name] on 08/28/23 by a audio enabled telemedicine application and verified that I am speaking with the correct person using two identifiers.  Patient Location: Home  Provider Location: Office/Clinic  I discussed the limitations of evaluation and management by telemedicine. The patient expressed understanding and agreed to proceed.  Vital Signs: Because this visit was a virtual/telehealth visit, some criteria may be missing or patient reported. Any vitals not documented were not able to be obtained and vitals that have been documented are patient reported.  VideoDeclined- This patient declined Librarian, academic. Therefore the visit was completed with audio only.  Persons Participating in Visit: Patient.  AWV Questionnaire: No: Patient Medicare AWV questionnaire was not completed prior to this visit.  Cardiac Risk Factors include: advanced age (>87men, >10 women);hypertension;dyslipidemia;obesity (BMI >30kg/m2)     Objective:     Today's Vitals   08/28/23 1516  Weight: 202 lb (91.6 kg)  Height: 5' 5.5 (1.664 m)   Body mass index is 33.1 kg/m.     08/28/2023    3:14 PM 08/23/2022    3:11 PM 06/14/2022    1:00 PM 06/14/2022    7:28 AM 06/01/2022   11:42 AM 10/11/2021    8:57 PM 08/29/2021   12:08 PM  Advanced Directives  Does Patient Have a Medical Advance Directive? No Yes No No No No Yes  Type of Furniture conservator/restorer;Living will     Living will;Healthcare Power of Attorney  Does patient want to make changes to medical advance directive?        No - Patient declined  Copy of Healthcare Power of Attorney in Chart?  No - copy requested     No - copy requested  Would patient like information on creating a medical advance directive? No - Patient declined   No - Patient declined No - Patient declined      Current Medications (verified) Outpatient Encounter Medications as of 08/28/2023  Medication Sig   albuterol  (PROAIR  HFA) 108 (90 Base) MCG/ACT inhaler Inhale 1-2 puffs into the lungs every 6 (six) hours as needed for wheezing or shortness of breath.   ALPRAZolam  (XANAX ) 0.5 MG tablet Take 1 tablet (0.5 mg total) by mouth at bedtime as needed for anxiety.   atorvastatin  (LIPITOR) 20 MG tablet Take 1 tablet by mouth once daily   Cholecalciferol  (VITAMIN D3) 50 MCG (2000 UT) TABS TAKE 1 TABLET BY MOUTH EVERY DAY   cyanocobalamin  (,VITAMIN B-12,) 1000 MCG/ML injection Inject 1,000 mcg into the muscle every 30 (thirty) days.   diclofenac  Sodium (VOLTAREN ) 1 % GEL Apply 2 g topically 4 (four) times daily. APPLY 2 GRAMS TOPICALLY 4 TIMES DAILY   ferrous gluconate  (FERGON) 324 MG tablet Take 1 tablet (324 mg total) by mouth daily.   gabapentin  (NEURONTIN ) 300 MG capsule Take 3 capsules (900 mg total) by mouth 3 (three) times daily.   lisinopril  (ZESTRIL ) 20 MG tablet Take 1 tablet (20 mg total) by mouth daily.   MAXITROL 3.5-10000-0.1 OINT SMARTSIG:1 sparingly In Eye(s) Twice Daily   MELATONIN PO Take 1 tablet by mouth as  needed (sleep).   methocarbamol  (ROBAXIN ) 500 MG tablet TAKE 1 TABLET BY MOUTH EVERY 8 HOURS AS NEEDED FOR MUSCLE SPASM   naloxone  (NARCAN ) nasal spray 4 mg/0.1 mL Place 1 spray into the nose as needed (opioid overdose).   omeprazole  (PRILOSEC) 20 MG capsule Take 1 capsule (20 mg total) by mouth daily. Take 1 capsule by mouth 30 minutes prior to a meal once a day   ondansetron  (ZOFRAN ) 4 MG tablet Take 1 tablet (4 mg total) by mouth every 8 (eight) hours as needed for nausea or vomiting.   oxyCODONE  (ROXICODONE ) 15 MG  immediate release tablet Take 1 tablet (15 mg total) by mouth every 4 (four) hours as needed for pain.   oxyCODONE -acetaminophen  (PERCOCET) 10-325 MG tablet Take 1 tablet by mouth 4 (four) times daily as needed.   Semaglutide , 2 MG/DOSE, (OZEMPIC , 2 MG/DOSE,) 8 MG/3ML SOPN INJECT 2MG  SUBCUTANEOUSLY ONCE A WEEK   Syringe/Needle, Disp, (SYRINGE 3CC/25GX1) 25G X 1 3 ML MISC Use for monthly B12 injections   Turmeric (QC TUMERIC COMPLEX PO) Take 1 tablet by mouth daily.   XTAMPZA  ER 18 MG C12A Take 18 mg by mouth every 12 (twelve) hours.   No facility-administered encounter medications on file as of 08/28/2023.    Allergies (verified) Phenergan  [promethazine  hcl]   History: Past Medical History:  Diagnosis Date   Allergy    Arthritis    Back pain    Colon cancer (HCC)    Depression    Diarrhea    GERD (gastroesophageal reflux disease)    Hypertension    Insomnia    MS (multiple sclerosis) (HCC) 2000   pt says that she doesn't have it   Other hammer toe (acquired) 03/31/2013   Pneumonia    hx of   Pre-diabetes    Umbilical hernia    Past Surgical History:  Procedure Laterality Date   ABDOMINAL HYSTERECTOMY     ANTERIOR CERVICAL DECOMP/DISCECTOMY FUSION N/A 03/01/2014   Procedure: ANTERIOR CERVICAL DECOMPRESSION/DISCECTOMY FUSION 1 LEVEL;  Surgeon: Baruch Bosch, MD;  Location: [facility];  Service: Neurosurgery;  Laterality: N/A;  ANTERIOR CERVICAL DECOMPRESSION/DISCECTOMY FUSION 1 LEVEL   BUNIONECTOMY Right 10/14/2014   @[facility]    CESAREAN SECTION     COLON SURGERY     COLONOSCOPY     FOOT SURGERY Right    GASTRIC BYPASS     Hammer Toe Repair Right 10/14/2014   RT #2, @[facility]    INCISION AND DRAINAGE HIP Left 06/02/2020   Procedure: IRRIGATION AND DEBRIDEMENT LEFT HIP, POSSIBLE HEAD BALL AND LINER EXCHANGE;  Surgeon: Adonica Hoose, MD;  Location: [facility];  Service: Orthopedics;  Laterality: Left;    KNEE ARTHROPLASTY Right 06/14/2022   Procedure: COMPUTER ASSISTED TOTAL KNEE  ARTHROPLASTY;  Surgeon: Adonica Hoose, MD;  Location: [facility];  Service: Orthopedics;  Laterality: Right;  160   TENOTOMY Right 10/14/2014   RT #3, @[facility]    TOTAL HIP ARTHROPLASTY Left 04/28/2020   Procedure: TOTAL HIP ARTHROPLASTY ANTERIOR APPROACH;  Surgeon: Adonica Hoose, MD;  Location: [facility];  Service: Orthopedics;  Laterality: Left;  3E bed   TOTAL HIP ARTHROPLASTY Right 01/25/2021   Procedure: TOTAL HIP ARTHROPLASTY ANTERIOR APPROACH;  Surgeon: Adonica Hoose, MD;  Location: [facility];  Service: Orthopedics;  Laterality: Right;   Family History  Problem Relation Age of Onset   Stroke Mother    Hypertension Mother    Hyperlipidemia Mother    Diabetes Mother    Diabetes Brother    Hypertension  Brother    Hypertension Brother    Hypertension Brother    Hypertension Brother    Hypertension Brother    Alcoholism Brother    Breast cancer Neg Hx    Social History   Socioeconomic History   Marital status: Single    Spouse name: Not on file   Number of children: 2   Years of education: Not on file   Highest education level: Associate degree: academic program  Occupational History   Occupation: Disability  Tobacco Use   Smoking status: Former    Current packs/day: 0.00    Average packs/day: 0.3 packs/day for 20.0 years (5.0 ttl pk-yrs)    Types: Cigarettes    Start date: 09/22/1998    Quit date: 09/22/2018    Years since quitting: 4.9   Smokeless tobacco: Never   Tobacco comments:    states she quit 3 months ago  Vaping Use   Vaping status: Never Used  Substance and Sexual Activity   Alcohol  use: Not Currently    Alcohol /week: 1.0 standard drink of alcohol     Types: 1 Glasses of wine per week    Comment: social   Drug use: No   Sexual activity: Yes    Partners: Male    Birth control/protection: Surgical  Other Topics Concern   Not on file  Social History Narrative   Single   Social Drivers of Health   Financial Resource Strain: Low Risk  (08/28/2023)   Overall  Financial Resource Strain (CARDIA)    Difficulty of Paying Living Expenses: Not hard at all  Food Insecurity: No Food Insecurity (08/28/2023)   Hunger Vital Sign    Worried About Running Out of Food in the Last Year: Never true    Ran Out of Food in the Last Year: Never true  Transportation Needs: No Transportation Needs (08/28/2023)   PRAPARE - Administrator, Civil Service (Medical): No    Lack of Transportation (Non-Medical): No  Physical Activity: Sufficiently Active (08/28/2023)   Exercise Vital Sign    Days of Exercise per Week: 5 days    Minutes of Exercise per Session: 90 min  Stress: No Stress Concern Present (08/28/2023)   [employer] of Occupational Health - Occupational Stress Questionnaire    Feeling of Stress : Not at all  Social Connections: Moderately Isolated (08/28/2023)   Social Connection and Isolation Panel [NHANES]    Frequency of Communication with Friends and Family: More than three times a week    Frequency of Social Gatherings with Friends and Family: Once a week    Attends Religious Services: 1 to 4 times per year    Active Member of [employer] or Organizations: No    Attends Engineer, structural: Never    Marital Status: Divorced    Tobacco Counseling Counseling given: No Tobacco comments: states she quit 3 months ago    Clinical Intake:  Pre-visit preparation completed: Yes  Pain : No/denies pain     BMI - recorded: 33.1 Nutritional Risks: None Diabetes: No  Lab Results  Component Value Date   HGBA1C 5.6 04/19/2023   HGBA1C 5.2 06/01/2022   HGBA1C 5.5 04/11/2022     How often do you need to have someone help you when you read instructions, pamphlets, or other written materials from your doctor or pharmacy?: 1 - Never  Interpreter Needed?: No  Information entered by :: Kandy Orris, CMA   Activities of Daily Living     08/28/2023  3:19 PM  In your present state of health, do you have any difficulty performing  the following activities:  Hearing? 0  Vision? 0  Difficulty concentrating or making decisions? 0  Walking or climbing stairs? 0  Dressing or bathing? 0  Doing errands, shopping? 0  Preparing Food and eating ? N  Using the Toilet? N  In the past six months, have you accidently leaked urine? N  Do you have problems with loss of bowel control? N  Managing your Medications? N  Managing your Finances? N  Housekeeping or managing your Housekeeping? N    Patient Care Team: Colene Dauphin, MD as PCP - General (Internal Medicine) Ruffin Cotton, DPM as Consulting Physician (Podiatry) [place], Delaney Fearing, [facility] (Inactive) as Pharmacist (Pharmacist) [place], [place], OD (Optometry)  I have updated your Care Teams any recent Medical Services you may have received from other providers in the past year.     Assessment:    This is a routine wellness examination for [place].  Hearing/Vision screen Hearing Screening - Comments:: Denies hearing difficulties   Vision Screening - Comments:: Wears rx glasses - up to date with routine eye exams with Dr Micael Adas   Goals Addressed               This Visit's Progress     Patient Stated (pt-stated)        Patient stated [facility]       Depression Screen     08/28/2023    3:21 PM 04/19/2023    8:15 AM 02/27/2023    9:17 AM 02/06/2023    2:41 PM 02/06/2023    2:40 PM 10/17/2022    8:17 AM 08/23/2022    3:10 PM  PHQ 2/9 Scores  PHQ - 2 Score 0 0 0 0 0 0 0  PHQ- 9 Score 1 0 0 0  0 0    Fall Risk     08/28/2023    3:20 PM 04/19/2023    8:15 AM 02/27/2023    9:17 AM 02/06/2023    2:40 PM 10/17/2022    8:11 AM  Fall Risk   Falls in the past year? 0 0 0 0 0  Number falls in past yr: 0 0 0 0 0  Injury with Fall? 0 0 0 0 0  Risk for fall due to : No Fall Risks No Fall Risks No Fall Risks No Fall Risks No Fall Risks  Follow up Falls evaluation completed;Falls prevention discussed  Falls evaluation completed Falls evaluation completed Falls evaluation completed Falls evaluation completed    MEDICARE RISK AT HOME:  Medicare Risk at Home Any stairs in or around the home?: No If so, are there any without handrails?: No Home free of loose throw rugs in walkways, pet beds, electrical cords, etc?: Yes Adequate lighting in your home to reduce risk of falls?: Yes Life alert?: No Use of a cane, walker or w/c?: Yes (cane) Grab bars in the bathroom?: Yes Shower chair or bench in shower?: Yes Elevated toilet seat or a handicapped toilet?: Yes  TIMED UP AND GO:  Was the test performed?  No  Cognitive Function: 6CIT completed        08/28/2023    3:23 PM 08/23/2022    3:14 PM 08/29/2021   12:21 PM  6CIT Screen  What Year? 0 points 0 points 0 points  What month? 0 points 0 points 0 points  What  time? 0 points 0 points 0 points  Count back from 20 0 points 0 points 0 points  Months in reverse 0 points 0 points 0 points  Repeat phrase 0 points 0 points 0 points  Total Score 0 points 0 points 0 points    Immunizations Immunization History  Administered Date(s) Administered   Influenza Whole 04/14/2010   Influenza, Seasonal, Injecte, Preservative Fre 11/28/2022   Influenza,inj,Quad PF,6+ Mos 03/01/2014, 01/18/2016, 04/17/2017, 01/13/2018, 11/25/2018, 01/07/2020, 12/26/2020, 11/30/2021   Influenza-Unspecified 03/16/2015   PFIZER Comirnaty(Gray Top)Covid-19 Tri-Sucrose Vaccine 08/29/2020   PFIZER(Purple Top)SARS-COV-2 Vaccination 02/12/2020   PNEUMOCOCCAL CONJUGATE-20 04/19/2023   PPD Test 08/06/2017   Pfizer(Comirnaty)Fall Seasonal Vaccine 12 years and older 12/08/2022    Screening Tests Health Maintenance  Topic Date Due   Zoster Vaccines- Shingrix (1 of 2) Never done   COVID-19 Vaccine (4 - Pfizer risk 2024-25 season) 06/07/2023   DTaP/Tdap/Td (1 - Tdap) 04/18/2024 (Originally 04/10/1980)   INFLUENZA VACCINE  10/18/2023   Medicare Annual Wellness (AWV)   08/27/2024   Colonoscopy  01/18/2025   MAMMOGRAM  01/23/2025   Pneumococcal Vaccine 54-62 Years old  Completed   Hepatitis C Screening  Completed   HIV Screening  Completed   HPV VACCINES  Aged Out   Meningococcal B Vaccine  Aged Out    Health Maintenance  Health Maintenance Due  Topic Date Due   Zoster Vaccines- Shingrix (1 of 2) Never done   COVID-19 Vaccine (4 - Pfizer risk 2024-25 season) 06/07/2023   Health Maintenance Items Addressed: 08/28/2023  I have recommended that this patient have a immunization for Shingles but she declines at this time. I have discussed the risks and benefits of this procedure with her. The patient verbalizes understanding.     Additional Screening:  Vision Screening: Recommended annual ophthalmology exams for early detection of glaucoma and other disorders of the eye.  Pt stated she's had her annual exam in 12/2022 w/Dr Micael Adas.  Next appt is scheduled for 01/2024.    Dental Screening: Recommended annual dental exams for proper oral hygiene  Community Resource Referral / Chronic Care Management: CRR required this visit?  No   CCM required this visit?  No   Plan:    I have personally reviewed and noted the following in the patient's chart:   Medical and social history Use of alcohol , tobacco or illicit drugs  Current medications and supplements including opioid prescriptions. Patient is currently taking opioid prescriptions. Information provided to patient regarding non-opioid alternatives. Patient advised to discuss non-opioid treatment plan with their provider. Functional ability and status Nutritional status Physical activity Advanced directives List of other physicians Hospitalizations, surgeries, and ER visits in previous 12 months Vitals Screenings to include cognitive, depression, and falls Referrals and appointments  In addition, I have reviewed and discussed with patient certain preventive protocols, quality metrics, and  best practice recommendations. A written personalized care plan for preventive services as well as general preventive health recommendations were provided to patient.   Patria Bookbinder, CMA   08/28/2023   After Visit Summary: (MyChart) Due to this being a telephonic visit, the after visit summary with patients personalized plan was offered to patient via MyChart   Notes: Nothing significant to report at this time.

## 2023-08-29 ENCOUNTER — Other Ambulatory Visit: Payer: Self-pay | Admitting: Internal Medicine

## 2023-09-02 ENCOUNTER — Ambulatory Visit

## 2023-09-06 ENCOUNTER — Ambulatory Visit

## 2023-09-06 DIAGNOSIS — E538 Deficiency of other specified B group vitamins: Secondary | ICD-10-CM | POA: Diagnosis not present

## 2023-09-06 MED ORDER — CYANOCOBALAMIN 1000 MCG/ML IJ SOLN
100.0000 ug | Freq: Once | INTRAMUSCULAR | Status: AC
  Administered 2023-09-06: 100 ug via INTRAMUSCULAR

## 2023-09-06 NOTE — Progress Notes (Signed)
 Pt here for monthly B12 injection per Dr.Burns  B12 1000mcg given IM and pt tolerated injection well.  Next B12 injection scheduled

## 2023-09-20 ENCOUNTER — Other Ambulatory Visit: Payer: Self-pay | Admitting: Internal Medicine

## 2023-09-26 ENCOUNTER — Other Ambulatory Visit: Payer: Self-pay | Admitting: Internal Medicine

## 2023-10-07 ENCOUNTER — Other Ambulatory Visit: Payer: Self-pay | Admitting: Internal Medicine

## 2023-10-07 ENCOUNTER — Ambulatory Visit: Payer: Self-pay

## 2023-10-07 ENCOUNTER — Ambulatory Visit (INDEPENDENT_AMBULATORY_CARE_PROVIDER_SITE_OTHER)

## 2023-10-07 ENCOUNTER — Ambulatory Visit: Admitting: Family Medicine

## 2023-10-07 VITALS — BP 126/70 | HR 74 | Temp 98.2°F | Ht 65.5 in | Wt 213.0 lb

## 2023-10-07 DIAGNOSIS — M79644 Pain in right finger(s): Secondary | ICD-10-CM

## 2023-10-07 NOTE — Patient Instructions (Addendum)
 Xray did not show any foreign body in your finger today  I am going to send  you to the hand specialists for further evaluation.

## 2023-10-07 NOTE — Progress Notes (Signed)
   Acute Office Visit  Subjective:     Patient ID: Misty [name], female    DOB: 09-28-1960, 63 y.o.   MRN: [id number]  Chief Complaint  Patient presents with   Hand Pain    Injured finger 1 year ago. Finger has never fully healed. Hit finger last week, and finger bled for awhile. Sore and tender to the touch    Patient is in today for evaluation of right 4th finger. States she injured her finger approximately 1 year ago. She reports that her finger is tender and bleeds and opens up at least once a month since that time. Patient states that she has been keeping a bandage on the area when the finger is irritated. She denies fever or chills.   ROS See HPI     Objective:    BP 126/70 (BP Location: Left Arm, Patient Position: Sitting, Cuff Size: Large)   Pulse 74   Temp 98.2 F (36.8 C) (Temporal)   Ht 5' 5.5 (1.664 m)   Wt 213 lb (96.6 kg)   SpO2 97%   BMI 34.91 kg/m    Physical Exam Vitals reviewed.  Constitutional:      Appearance: Normal appearance.  HENT:     Head: Normocephalic and atraumatic.  Cardiovascular:     Rate and Rhythm: Normal rate and regular rhythm.     Heart sounds: Normal heart sounds.  Pulmonary:     Effort: Pulmonary effort is normal.     Breath sounds: Normal breath sounds.  Skin:    General: Skin is warm and dry.     Findings: Wound present.     Comments: Sweling and wound noted to lateral distal nail groove   Neurological:     Mental Status: She is alert.    No results found for any visits on 10/07/23.      Assessment & Plan:   Problem List Items Addressed This Visit   None Visit Diagnoses       Finger pain, right    -  Primary   Relevant Orders   DG Finger Ring Right (Completed)   Ambulatory referral to Hand Surgery       No orders of the defined types were placed in this encounter.   Return if symptoms worsen or fail to improve.  Debby CHRISTELLA Borer, RN

## 2023-10-07 NOTE — Telephone Encounter (Signed)
 FYI Only or Action Required?: FYI only for provider.  Patient was last seen in primary care on 04/19/2023 by Geofm Glade PARAS, MD.  Called Nurse Triage reporting Hand Pain.  Symptoms began several months ago.  Symptoms are: unchanged.  Triage Disposition: See PCP When Office is Open (Within 3 Days)  Patient/caregiver understands and will follow disposition?: Yes    Copied from CRM [id number]. Topic: Clinical - Medication Question >> Oct 07, 2023 12:36 PM Henretta I wrote: Reason for CRM: Patient is leaving out of town tomorrow morning and is out of gabapentin  (NEURONTIN ) 300 MG capsule. Patient would like to know if this can be sent in today as she will be gone and by the time she gets back it will be a Sunday and pharmacy will be closed.    Reason for Disposition  [1] MODERATE pain (e.g., interferes with normal activities) AND [2] present > 3 days  Answer Assessment - Initial Assessment Questions 1. ONSET: When did the pain start?      1 year ago 2. LOCATION and RADIATION: Where is the pain located?  (e.g., fingertip, around nail, joint, entire      Right ring finger  3. SEVERITY: How bad is the pain? What does it keep you from doing?   (Scale 1-10; or mild, moderate, severe)     6/10 4. APPEARANCE: What does the finger look like? (e.g., redness, swelling, bruising, pallor)     Small opening from old injury  5. WORK OR EXERCISE: Has there been any recent work or exercise that involved this part (i.e., fingers or hand) of the body?     No 6. CAUSE: What do you think is causing the pain?     Old injury to finger 1 year ago 8. OTHER SYMPTOMS: Do you have any other symptoms? (e.g., fever, neck pain, numbness)     No  Protocols used: Finger Pain-A-AH

## 2023-10-07 NOTE — Progress Notes (Deleted)
   Acute Office Visit  Subjective:     Patient ID: Misty [name], female    DOB: 1960-10-29, 63 y.o.   MRN: [id number]  No chief complaint on file.   HPI  Discussed the use of AI scribe software for clinical note transcription with the patient, who gave verbal consent to proceed.  History of Present Illness      ROS Per HPI      Objective:    There were no vitals taken for this visit.   Physical Exam Vitals and nursing note reviewed.  Constitutional:      General: She is not in acute distress.    Appearance: Normal appearance. She is normal weight.  HENT:     Head: Normocephalic and atraumatic.     Right Ear: External ear normal.     Left Ear: External ear normal.     Nose: Nose normal.     Mouth/Throat:     Mouth: Mucous membranes are moist.     Pharynx: Oropharynx is clear.  Eyes:     Extraocular Movements: Extraocular movements intact.     Pupils: Pupils are equal, round, and reactive to light.  Cardiovascular:     Rate and Rhythm: Normal rate and regular rhythm.     Pulses: Normal pulses.     Heart sounds: Normal heart sounds.  Pulmonary:     Effort: Pulmonary effort is normal. No respiratory distress.     Breath sounds: Normal breath sounds. No wheezing, rhonchi or rales.  Musculoskeletal:        General: Normal range of motion.     Cervical back: Normal range of motion.     Right lower leg: No edema.     Left lower leg: No edema.  Lymphadenopathy:     Cervical: No cervical adenopathy.  Neurological:     General: No focal deficit present.     Mental Status: She is alert and oriented to person, place, and time.  Psychiatric:        Mood and Affect: Mood normal.        Thought Content: Thought content normal.    Procedures  No results found for any visits on 10/07/23.      Assessment & Plan:   Assessment and Plan Assessment & Plan      No orders of the defined types were placed in this encounter.    No orders of the defined  types were placed in this encounter.   No follow-ups on file.  Corean LITTIE Ku, FNP

## 2023-10-13 ENCOUNTER — Encounter: Payer: Self-pay | Admitting: Family Medicine

## 2023-10-16 NOTE — Progress Notes (Unsigned)
 Subjective:    Patient ID: Misty [name], female    DOB: 10-Sep-1960, 63 y.o.   MRN: [id number]     HPI Misty [name] is here for follow up of her chronic medical problems.  [facility]ong PT for her foot - her weight is on the lateral aspect of her foot.  She is doing the exercises 2-3 times a week.   Medication for back is not helping much - may need to have a nerve block.  Back pain is significantly affecting her ADLs  On semaglutide  - weight has stabilized  Medications and allergies reviewed with patient and updated if appropriate.  Current Outpatient Medications on File Prior to Visit  Medication Sig Dispense Refill   albuterol  (PROAIR  HFA) 108 (90 Base) MCG/ACT inhaler Inhale 1-2 puffs into the lungs every 6 (six) hours as needed for wheezing or shortness of breath. 1 each 5   ALPRAZolam  (XANAX ) 0.5 MG tablet Take 1 tablet (0.5 mg total) by mouth at bedtime as needed for anxiety. 20 tablet 0   atorvastatin  (LIPITOR) 20 MG tablet Take 1 tablet by mouth once daily 90 tablet 0   Cholecalciferol  (VITAMIN D3) 50 MCG (2000 UT) TABS TAKE 1 TABLET BY MOUTH EVERY DAY 90 tablet 3   cyanocobalamin  (,VITAMIN B-12,) 1000 MCG/ML injection Inject 1,000 mcg into the muscle every 30 (thirty) days.     diclofenac  Sodium (VOLTAREN ) 1 % GEL Apply 2 g topically 4 (four) times daily. APPLY 2 GRAMS TOPICALLY 4 TIMES DAILY 100 g 5   ferrous gluconate  (FERGON) 324 MG tablet Take 1 tablet (324 mg total) by mouth daily.  3   gabapentin  (NEURONTIN ) 300 MG capsule TAKE 3 CAPSULES BY MOUTH THREE TIMES DAILY 270 capsule 0   lisinopril  (ZESTRIL ) 20 MG tablet Take 1 tablet by mouth once daily 90 tablet 0   MAXITROL 3.5-10000-0.1 OINT SMARTSIG:1 sparingly In Eye(s) Twice Daily     MELATONIN PO Take 1 tablet by mouth as needed (sleep).     naloxone  (NARCAN ) nasal spray 4 mg/0.1 mL Place 1 spray into the nose as needed (opioid overdose).     omeprazole  (PRILOSEC) 20 MG capsule Take 1 capsule (20 mg total) by mouth  daily. Take 1 capsule by mouth 30 minutes prior to a meal once a day     ondansetron  (ZOFRAN ) 4 MG tablet Take 1 tablet (4 mg total) by mouth every 8 (eight) hours as needed for nausea or vomiting. 30 tablet 8   oxyCODONE -acetaminophen  (PERCOCET) 10-325 MG tablet Take 1 tablet by mouth 4 (four) times daily as needed.     Semaglutide , 2 MG/DOSE, (OZEMPIC , 2 MG/DOSE,) 8 MG/3ML SOPN INJECT 2MG  SUBCUTANEOUSLY ONCE A WEEK 3 mL 4   Syringe/Needle, Disp, (SYRINGE 3CC/25GX1) 25G X 1 3 ML MISC Use for monthly B12 injections 3 each 3   Turmeric (QC TUMERIC COMPLEX PO) Take 1 tablet by mouth daily.     XTAMPZA  ER 18 MG C12A Take 18 mg by mouth every 12 (twelve) hours.     No current facility-administered medications on file prior to visit.     Review of Systems  Constitutional:  Negative for fever.  Respiratory:  Negative for cough, shortness of breath and wheezing.   Cardiovascular:  Negative for chest pain, palpitations and leg swelling.  Musculoskeletal:  Positive for back pain.  Neurological:  Negative for light-headedness and headaches.       Objective:   Vitals:   10/17/23 0801  BP: 120/70  Pulse:  77  Temp: 98 F (36.7 C)  SpO2: 97%   BP Readings from Last 3 Encounters:  10/17/23 120/70  10/07/23 126/70  04/19/23 118/70   Wt Readings from Last 3 Encounters:  10/17/23 211 lb (95.7 kg)  10/07/23 213 lb (96.6 kg)  08/28/23 202 lb (91.6 kg)   Body mass index is 34.58 kg/m.    Physical Exam Constitutional:      General: She is not in acute distress.    Appearance: Normal appearance.  HENT:     Head: Normocephalic and atraumatic.  Eyes:     Conjunctiva/sclera: Conjunctivae normal.  Cardiovascular:     Rate and Rhythm: Normal rate and regular rhythm.     Heart sounds: Normal heart sounds.  Pulmonary:     Effort: Pulmonary effort is normal. No respiratory distress.     Breath sounds: Normal breath sounds. No wheezing.  Musculoskeletal:     Cervical back: Neck supple.      Right lower leg: No edema.     Left lower leg: No edema.  Lymphadenopathy:     Cervical: No cervical adenopathy.  Skin:    General: Skin is warm and dry.     Findings: No rash.  Neurological:     Mental Status: She is alert. Mental status is at baseline.  Psychiatric:        Mood and Affect: Mood normal.        Behavior: Behavior normal.        Lab Results  Component Value Date   WBC 5.2 04/19/2023   HGB 12.1 04/19/2023   HCT 37.9 04/19/2023   PLT 253.0 04/19/2023   GLUCOSE 64 (L) 04/19/2023   CHOL 182 04/19/2023   TRIG 97.0 04/19/2023   HDL 86.10 04/19/2023   LDLCALC 76 04/19/2023   ALT 11 04/19/2023   AST 16 04/19/2023   NA 142 04/19/2023   K 4.8 04/19/2023   CL 105 04/19/2023   CREATININE 0.80 04/19/2023   BUN 20 04/19/2023   CO2 29 04/19/2023   TSH 0.39 04/19/2023   INR 1.0 01/19/2021   HGBA1C 5.6 04/19/2023     Assessment & Plan:    See Problem List for Assessment and Plan of chronic medical problems.

## 2023-10-16 NOTE — Patient Instructions (Addendum)
      Blood work was ordered.       Medications changes include :   None    A referral was ordered and someone will call you to schedule an appointment.     Return in about 6 months (around 04/18/2024) for Physical Exam.

## 2023-10-17 ENCOUNTER — Encounter: Payer: Self-pay | Admitting: Internal Medicine

## 2023-10-17 ENCOUNTER — Ambulatory Visit (INDEPENDENT_AMBULATORY_CARE_PROVIDER_SITE_OTHER): Payer: 59 | Admitting: Internal Medicine

## 2023-10-17 ENCOUNTER — Ambulatory Visit: Admitting: Internal Medicine

## 2023-10-17 VITALS — BP 120/70 | HR 77 | Temp 98.0°F | Ht 65.5 in | Wt 211.0 lb

## 2023-10-17 DIAGNOSIS — K219 Gastro-esophageal reflux disease without esophagitis: Secondary | ICD-10-CM

## 2023-10-17 DIAGNOSIS — N1832 Chronic kidney disease, stage 3b: Secondary | ICD-10-CM | POA: Diagnosis not present

## 2023-10-17 DIAGNOSIS — E538 Deficiency of other specified B group vitamins: Secondary | ICD-10-CM

## 2023-10-17 DIAGNOSIS — E559 Vitamin D deficiency, unspecified: Secondary | ICD-10-CM | POA: Diagnosis not present

## 2023-10-17 DIAGNOSIS — I10 Essential (primary) hypertension: Secondary | ICD-10-CM

## 2023-10-17 DIAGNOSIS — E7849 Other hyperlipidemia: Secondary | ICD-10-CM | POA: Diagnosis not present

## 2023-10-17 DIAGNOSIS — E66811 Obesity, class 1: Secondary | ICD-10-CM | POA: Insufficient documentation

## 2023-10-17 DIAGNOSIS — R7303 Prediabetes: Secondary | ICD-10-CM | POA: Diagnosis not present

## 2023-10-17 DIAGNOSIS — M48062 Spinal stenosis, lumbar region with neurogenic claudication: Secondary | ICD-10-CM

## 2023-10-17 DIAGNOSIS — Z96651 Presence of right artificial knee joint: Secondary | ICD-10-CM | POA: Insufficient documentation

## 2023-10-17 DIAGNOSIS — G622 Polyneuropathy due to other toxic agents: Secondary | ICD-10-CM

## 2023-10-17 LAB — CBC WITH DIFFERENTIAL/PLATELET
Basophils Absolute: 0 K/uL (ref 0.0–0.1)
Basophils Relative: 0.4 % (ref 0.0–3.0)
Eosinophils Absolute: 0.1 K/uL (ref 0.0–0.7)
Eosinophils Relative: 1.8 % (ref 0.0–5.0)
HCT: 36 % (ref 36.0–46.0)
Hemoglobin: 11.5 g/dL — ABNORMAL LOW (ref 12.0–15.0)
Lymphocytes Relative: 29.9 % (ref 12.0–46.0)
Lymphs Abs: 1.6 K/uL (ref 0.7–4.0)
MCHC: 31.9 g/dL (ref 30.0–36.0)
MCV: 90.7 fl (ref 78.0–100.0)
Monocytes Absolute: 0.5 K/uL (ref 0.1–1.0)
Monocytes Relative: 9.9 % (ref 3.0–12.0)
Neutro Abs: 3.1 K/uL (ref 1.4–7.7)
Neutrophils Relative %: 58 % (ref 43.0–77.0)
Platelets: 227 K/uL (ref 150.0–400.0)
RBC: 3.97 Mil/uL (ref 3.87–5.11)
RDW: 14.1 % (ref 11.5–15.5)
WBC: 5.3 K/uL (ref 4.0–10.5)

## 2023-10-17 LAB — COMPREHENSIVE METABOLIC PANEL WITH GFR
ALT: 13 U/L (ref 0–35)
AST: 17 U/L (ref 0–37)
Albumin: 3.9 g/dL (ref 3.5–5.2)
Alkaline Phosphatase: 99 U/L (ref 39–117)
BUN: 23 mg/dL (ref 6–23)
CO2: 25 meq/L (ref 19–32)
Calcium: 9.5 mg/dL (ref 8.4–10.5)
Chloride: 107 meq/L (ref 96–112)
Creatinine, Ser: 0.72 mg/dL (ref 0.40–1.20)
GFR: 89.55 mL/min (ref >60.00)
Glucose, Bld: 81 mg/dL (ref 70–99)
Potassium: 4.4 meq/L (ref 3.5–5.1)
Sodium: 138 meq/L (ref 135–145)
Total Bilirubin: 0.5 mg/dL (ref 0.2–1.2)
Total Protein: 7.2 g/dL (ref 6.0–8.3)

## 2023-10-17 LAB — LIPID PANEL
Cholesterol: 174 mg/dL (ref 0–200)
HDL: 82.2 mg/dL (ref >39.00)
LDL Cholesterol: 74 mg/dL (ref 0–99)
NonHDL: 91.32
Total CHOL/HDL Ratio: 2
Triglycerides: 86 mg/dL (ref 0.0–149.0)
VLDL: 17.2 mg/dL (ref 0.0–40.0)

## 2023-10-17 LAB — HEMOGLOBIN A1C: Hgb A1c MFr Bld: 5.5 % (ref 4.6–6.5)

## 2023-10-17 LAB — VITAMIN B12: Vitamin B-12: >1500 pg/mL — ABNORMAL HIGH (ref 211–911)

## 2023-10-17 LAB — VITAMIN D 25 HYDROXY (VIT D DEFICIENCY, FRACTURES): VITD: 42.69 ng/mL (ref 30.00–100.00)

## 2023-10-17 MED ORDER — METHOCARBAMOL 500 MG PO TABS: ORAL_TABLET | ORAL | 1 refills | Status: DC

## 2023-10-17 MED ORDER — CYANOCOBALAMIN 1000 MCG/ML IJ SOLN
1000.0000 ug | Freq: Once | INTRAMUSCULAR | Status: AC
  Administered 2023-10-17: 1000 ug via INTRAMUSCULAR

## 2023-10-17 NOTE — Assessment & Plan Note (Signed)
 Chronic GERD controlled Continue omeprazole  20 mg daily - 30 minutes prior to a meal

## 2023-10-17 NOTE — Assessment & Plan Note (Signed)
 Chronic Lab Results  Component Value Date   LDLCALC 76 04/19/2023    Check lipid panel, CMP Continue atorvastatin  20 mg daily Regular exercise and healthy diet encouraged

## 2023-10-17 NOTE — Assessment & Plan Note (Signed)
Chronic Stinging/tingling sensation throughout body Continue monthly B12 injections Continue gabapentin 900 mg 3 times daily

## 2023-10-17 NOTE — Assessment & Plan Note (Signed)
 Chronic Secondary to gastric bypass B12 injection today Continue monthly injections

## 2023-10-17 NOTE — Assessment & Plan Note (Signed)
 Chronic lower back pain Following with pain management  Takes pain medication as needed Continue methocarbamol  500 mg every 8 hours as needed Continue gabapentin  900 mg 3 times daily On Xtampza  18 mg twice daily, oxycodone -acetaminophen  10-325 mg every 4 hours as needed Continue TENS unit, pain patches

## 2023-10-17 NOTE — Addendum Note (Signed)
 Addended by: GEOFM GLADE PARAS on: 10/17/2023 08:55 AM   Modules accepted: Level of Service

## 2023-10-17 NOTE — Assessment & Plan Note (Signed)
 Chronic Lab Results  Component Value Date   HGBA1C 5.6 04/19/2023   Check a1c Low sugar / carb diet Stressed regular exercise

## 2023-10-17 NOTE — Assessment & Plan Note (Signed)
 Chronic BP well controlled CBC, CMP Continue lisinopril 20 mg daily

## 2023-10-17 NOTE — Assessment & Plan Note (Signed)
 Chronic Improved and kidney function has been good recently Cmp, cbc BP well controlled

## 2023-10-17 NOTE — Assessment & Plan Note (Signed)
 Chronic Taking vitamin D daily Check vitamin D level

## 2023-10-17 NOTE — Assessment & Plan Note (Addendum)
 Chronic With prediabetes, htn, hld Continue ozempic  2 mg weekly She is exercising and has revised her diet and eating healthy She requires ozempic  to help maintain her weight

## 2023-10-17 NOTE — Addendum Note (Signed)
 Addended by: CLAUDENE TOBIAS PARAS on: 10/17/2023 04:20 PM   Modules accepted: Orders

## 2023-10-18 ENCOUNTER — Ambulatory Visit: Payer: Self-pay | Admitting: Internal Medicine

## 2023-10-18 DIAGNOSIS — L03019 Cellulitis of unspecified finger: Secondary | ICD-10-CM | POA: Insufficient documentation

## 2023-10-18 DIAGNOSIS — M79644 Pain in right finger(s): Secondary | ICD-10-CM | POA: Insufficient documentation

## 2023-10-18 DIAGNOSIS — M19049 Primary osteoarthritis, unspecified hand: Secondary | ICD-10-CM | POA: Insufficient documentation

## 2023-10-21 ENCOUNTER — Emergency Department (HOSPITAL_BASED_OUTPATIENT_CLINIC_OR_DEPARTMENT_OTHER): Admitting: Radiology

## 2023-10-21 ENCOUNTER — Emergency Department (HOSPITAL_BASED_OUTPATIENT_CLINIC_OR_DEPARTMENT_OTHER)

## 2023-10-21 ENCOUNTER — Emergency Department (HOSPITAL_BASED_OUTPATIENT_CLINIC_OR_DEPARTMENT_OTHER)
Admission: EM | Admit: 2023-10-21 | Discharge: 2023-10-21 | Disposition: A | Discharge status: Home / Self Care | Attending: Emergency Medicine | Admitting: Emergency Medicine

## 2023-10-21 ENCOUNTER — Other Ambulatory Visit: Payer: Self-pay

## 2023-10-21 ENCOUNTER — Encounter (HOSPITAL_BASED_OUTPATIENT_CLINIC_OR_DEPARTMENT_OTHER): Payer: Self-pay | Admitting: Emergency Medicine

## 2023-10-21 DIAGNOSIS — W109XXA Fall (on) (from) unspecified stairs and steps, initial encounter: Secondary | ICD-10-CM | POA: Insufficient documentation

## 2023-10-21 DIAGNOSIS — S0083XA Contusion of other part of head, initial encounter: Secondary | ICD-10-CM | POA: Diagnosis not present

## 2023-10-21 DIAGNOSIS — Z79899 Other long term (current) drug therapy: Secondary | ICD-10-CM | POA: Insufficient documentation

## 2023-10-21 DIAGNOSIS — S8002XA Contusion of left knee, initial encounter: Secondary | ICD-10-CM | POA: Insufficient documentation

## 2023-10-21 DIAGNOSIS — S6992XA Unspecified injury of left wrist, hand and finger(s), initial encounter: Secondary | ICD-10-CM | POA: Diagnosis present

## 2023-10-21 DIAGNOSIS — S62522A Displaced fracture of distal phalanx of left thumb, initial encounter for closed fracture: Secondary | ICD-10-CM | POA: Diagnosis not present

## 2023-10-21 DIAGNOSIS — I10 Essential (primary) hypertension: Secondary | ICD-10-CM | POA: Insufficient documentation

## 2023-10-21 DIAGNOSIS — W19XXXA Unspecified fall, initial encounter: Secondary | ICD-10-CM

## 2023-10-21 DIAGNOSIS — S40012A Contusion of left shoulder, initial encounter: Secondary | ICD-10-CM | POA: Insufficient documentation

## 2023-10-21 DIAGNOSIS — S62235A Other nondisplaced fracture of base of first metacarpal bone, left hand, initial encounter for closed fracture: Secondary | ICD-10-CM | POA: Diagnosis not present

## 2023-10-21 DIAGNOSIS — S20212A Contusion of left front wall of thorax, initial encounter: Secondary | ICD-10-CM | POA: Diagnosis not present

## 2023-10-21 MED ORDER — HYDROMORPHONE HCL 1 MG/ML IJ SOLN
2.0000 mg | Freq: Once | INTRAMUSCULAR | Status: AC
  Administered 2023-10-21: 2 mg via INTRAMUSCULAR
  Filled 2023-10-21: qty 2

## 2023-10-21 MED ORDER — KETOROLAC TROMETHAMINE 60 MG/2ML IM SOLN
30.0000 mg | Freq: Once | INTRAMUSCULAR | Status: AC
  Administered 2023-10-21: 30 mg via INTRAMUSCULAR
  Filled 2023-10-21: qty 2

## 2023-10-21 MED ORDER — OXYCODONE HCL 5 MG PO TABS
10.0000 mg | ORAL_TABLET | Freq: Once | ORAL | Status: AC
  Administered 2023-10-21: 10 mg via ORAL
  Filled 2023-10-21: qty 2

## 2023-10-21 MED ORDER — LIDOCAINE 5 % EX PTCH
1.0000 | MEDICATED_PATCH | CUTANEOUS | Status: DC
  Administered 2023-10-21: 1 via TRANSDERMAL
  Filled 2023-10-21: qty 1

## 2023-10-21 NOTE — ED Provider Notes (Signed)
**Misty [name] De-Identified via Obfuscation**  Misty Misty [name]   CSN: [id number] Arrival date & time: 10/21/23  9287     Patient presents with: Misty Misty [name] is a 63 y.o. female.   Patient is a 63 year old female with a history of MS, hypertension, degenerative disc disease on chronic pain medication with Xtampza  and oxycodone  10 mg daily who presents today with pain on the left side of her body after a fall last night.  This occurred around 9 PM last night.  She was in her garage putting up a pan on a shelf when she lost her balance and fell down 4 stairs onto the cement.  She did not lose consciousness and denies taking any anticoagulants.  She was able to get up and get back inside but since that time has had significant pain in the left side of her face, shoulder, wrist, hand and left knee.  She has also had pain in the left side of the ribs that is worse when she takes a deep breath.  She has had no nausea or vomiting.  Mild headache but it has been the same since the fall.  She took her Xtampza  and in the middle of the night took a oxycodone  10 but reports was still having significant pain and was concerned she may have broken something.  She denies any shortness of breath.  She has no abdominal pain or midline back pain.  No weakness in her legs.  The history is provided by the patient.  Fall       Prior to Admission medications   Medication Sig Start Date End Date Taking? Authorizing Provider  albuterol  (PROAIR  HFA) 108 (90 Base) MCG/ACT inhaler Inhale 1-2 puffs into the lungs every 6 (six) hours as needed for wheezing or shortness of breath. 12/26/22   Geofm Glade PARAS, MD  ALPRAZolam  (XANAX ) 0.5 MG tablet Take 1 tablet (0.5 mg total) by mouth at bedtime as needed for anxiety. 05/27/23   Geofm Glade PARAS, MD  atorvastatin  (LIPITOR) 20 MG tablet Take 1 tablet by mouth once daily 08/29/23   Geofm Glade PARAS, MD  Cholecalciferol  (VITAMIN D3) 50 MCG (2000 UT) TABS TAKE 1  TABLET BY MOUTH EVERY DAY 01/04/22   Burns, Glade PARAS, MD  cyanocobalamin  (,VITAMIN B-12,) 1000 MCG/ML injection Inject 1,000 mcg into the muscle every 30 (thirty) days. 04/16/19   [provider]  diclofenac  Sodium (VOLTAREN ) 1 % GEL Apply 2 g topically 4 (four) times daily. APPLY 2 GRAMS TOPICALLY 4 TIMES DAILY 12/26/22   Geofm Glade PARAS, MD  ferrous gluconate  (FERGON) 324 MG tablet Take 1 tablet (324 mg total) by mouth daily. 11/30/21   Geofm Glade PARAS, MD  gabapentin  (NEURONTIN ) 300 MG capsule TAKE 3 CAPSULES BY MOUTH THREE TIMES DAILY 10/07/23   Geofm Glade PARAS, MD  lisinopril  (ZESTRIL ) 20 MG tablet Take 1 tablet by mouth once daily 09/26/23   Burns, Stacy J, MD  MAXITROL 3.5-10000-0.1 OINT SMARTSIG:1 sparingly In Eye(s) Twice Daily 01/30/23   [provider]  MELATONIN PO Take 1 tablet by mouth as needed (sleep).    [provider]  methocarbamol  (ROBAXIN ) 500 MG tablet TAKE 1 TABLET BY MOUTH EVERY 8 HOURS AS NEEDED FOR MUSCLE SPASM 10/17/23   Geofm Glade PARAS, MD  naloxone  (NARCAN ) nasal spray 4 mg/0.1 mL Place 1 spray into the nose as needed (opioid overdose).    [provider]  omeprazole  (PRILOSEC) 20 MG capsule Take 1 capsule (20  mg total) by mouth daily. Take 1 capsule by mouth 30 minutes prior to a meal once a day 04/19/23   Geofm Glade PARAS, MD  ondansetron  (ZOFRAN ) 4 MG tablet Take 1 tablet (4 mg total) by mouth every 8 (eight) hours as needed for nausea or vomiting. 04/19/23 04/18/24  Geofm Glade PARAS, MD  oxyCODONE -acetaminophen  (PERCOCET) 10-325 MG tablet Take 1 tablet by mouth 4 (four) times daily as needed. 11/16/22   [provider]  Semaglutide , 2 MG/DOSE, (OZEMPIC , 2 MG/DOSE,) 8 MG/3ML SOPN INJECT 2MG  SUBCUTANEOUSLY ONCE A WEEK 06/16/23   Burns, Glade PARAS, MD  Syringe/Needle, Disp, (SYRINGE 3CC/25GX1) 25G X 1 3 ML MISC Use for monthly B12 injections 05/30/21   Burns, Glade PARAS, MD  Turmeric (QC TUMERIC COMPLEX PO) Take 1 tablet by mouth daily.    [provider]  XTAMPZA  ER 18 MG C12A Take 18 mg by mouth every 12 (twelve) hours. 03/10/20   [provider]    Allergies: Phenergan  [promethazine  hcl]    Review of Systems  Updated Vital Signs BP 137/77   Pulse (!) 125   Temp 98.2 F (36.8 C)   Resp 18   SpO2 99%   Physical Exam Vitals and nursing Misty [name] reviewed.  Constitutional:      General: She is not in acute distress.    Appearance: She is well-developed.  HENT:     Head: Normocephalic. Contusion present.      Comments: Tenderness with palpation of the left orbit Eyes:     Conjunctiva/sclera: Conjunctivae normal.     Pupils: Pupils are equal, round, and reactive to light.  Neck:   Cardiovascular:     Rate and Rhythm: Normal rate and regular rhythm.     Pulses: Normal pulses.     Heart sounds: No murmur heard. Pulmonary:     Effort: Pulmonary effort is normal. No respiratory distress.     Breath sounds: Normal breath sounds. No wheezing or rales.  Chest:     Chest wall: Tenderness present.    Abdominal:     General: There is no distension.     Palpations: Abdomen is soft.     Tenderness: There is no abdominal tenderness. There is no guarding or rebound.  Musculoskeletal:        General: Tenderness present.     Left shoulder: Tenderness and bony tenderness present.     Left elbow: Normal.     Left wrist: Tenderness and bony tenderness present. No snuff box tenderness. Decreased range of motion.     Left hand: Tenderness present. Decreased range of motion.       Hands:     Cervical back: Normal range of motion and neck supple. Muscular tenderness present.     Left knee: Bony tenderness present. Tenderness present over the medial joint line and lateral joint line.     Comments: Tenderness over the 1st and 2nd MCP joint with some mild swelling noted.  Tenderness with humeral head palpation and decreased range of motion of the left shoulder.  Pain with flexion of the left knee and will only flex the  knee to about 40 degrees  Skin:    General: Skin is warm and dry.     Findings: No erythema or rash.  Neurological:     Mental Status: She is alert and oriented to person, place, and time.  Psychiatric:        Behavior: Behavior normal.     (all labs ordered are listed,  but only abnormal results are displayed) Labs Reviewed - No data to display  EKG: None  Radiology: DG Wrist Complete Left Result Date: 10/21/2023 CLINICAL DATA:  Fall last night.  Pain in the left hand and wrist. EXAM: LEFT WRIST - COMPLETE 3+ VIEW; LEFT HAND - COMPLETE 3+ VIEW COMPARISON:  None Available. FINDINGS: The bones appear adequately mineralized. There is an acute, mildly displaced intra-articular fracture involving the base of the 1st metacarpal. There is associated mild radial subluxation at the 1st carpometacarpal joint. There is also a mildly displaced fracture through the base of the 1st distal phalanx with probable intra-articular extension, best seen on the AP view. No other evidence of acute fracture or dislocation. The intercarpal joint spaces are preserved. Mild interphalangeal degenerative changes are present. IMPRESSION: 1. Acute, mildly displaced intra-articular fracture involving the base of the 1st metacarpal with associated mild radial subluxation at the 1st carpometacarpal joint. 2. Mildly displaced intra-articular fracture of the base of the 1st distal phalanx. Electronically Signed   By: Elsie Perone M.D.   On: 10/21/2023 10:01   DG Hand Complete Left Result Date: 10/21/2023 CLINICAL DATA:  Fall last night.  Pain in the left hand and wrist. EXAM: LEFT WRIST - COMPLETE 3+ VIEW; LEFT HAND - COMPLETE 3+ VIEW COMPARISON:  None Available. FINDINGS: The bones appear adequately mineralized. There is an acute, mildly displaced intra-articular fracture involving the base of the 1st metacarpal. There is associated mild radial subluxation at the 1st carpometacarpal joint. There is also a mildly displaced  fracture through the base of the 1st distal phalanx with probable intra-articular extension, best seen on the AP view. No other evidence of acute fracture or dislocation. The intercarpal joint spaces are preserved. Mild interphalangeal degenerative changes are present. IMPRESSION: 1. Acute, mildly displaced intra-articular fracture involving the base of the 1st metacarpal with associated mild radial subluxation at the 1st carpometacarpal joint. 2. Mildly displaced intra-articular fracture of the base of the 1st distal phalanx. Electronically Signed   By: Elsie Perone M.D.   On: 10/21/2023 10:01   DG Shoulder Left Result Date: 10/21/2023 CLINICAL DATA:  Left shoulder pain after falling last night. EXAM: LEFT SHOULDER - 2+ VIEW COMPARISON:  None Available. FINDINGS: The mineralization and alignment are normal. There is no evidence of acute fracture or dislocation. Mild acromioclavicular and glenohumeral degenerative changes. The subacromial space appears adequately preserved. Previous lower cervical fusion noted. IMPRESSION: No evidence of acute fracture or dislocation. Mild degenerative changes. Electronically Signed   By: Elsie Perone M.D.   On: 10/21/2023 09:57   DG Ribs Unilateral W/Chest Left Result Date: 10/21/2023 CLINICAL DATA:  Left rib pain after falling. EXAM: LEFT RIBS AND CHEST - 3+ VIEW COMPARISON:  Chest radiographs 08/18/2015 and 12/09/2010. FINDINGS: The heart size and mediastinal contours are stable with mild aortic atherosclerosis. Mildly increased atelectasis or scarring at the left lung base. No confluent airspace disease, pleural effusion or pneumothorax. No acute left-sided rib fractures are identified. Patient is status post lower cervical fusion. IMPRESSION: No evidence of acute left-sided rib fracture, pleural effusion or pneumothorax. Mildly increased atelectasis or scarring at the left lung base. Electronically Signed   By: Elsie Perone M.D.   On: 10/21/2023 09:56   DG Knee  Complete 4 Views Left Result Date: 10/21/2023 EXAM: 4 VIEW(S) XRAY OF THE LEFT KNEE 10/21/2023 09:24:58 AM COMPARISON: None available. CLINICAL HISTORY: Mechanical fall last night, C/o left side of face, left shoulder, left ribs, left wrist, left hand, and left  knee. Denies LOC. Denies Thinners. FINDINGS: BONES AND JOINTS: No acute fracture. No focal osseous lesion. No joint dislocation. 3 compartment osteoarthritis, most significant in the medial compartment. Limited evaluation for joint effusion. SOFT TISSUES: The soft tissues are unremarkable. IMPRESSION: 1. No acute findings. 2. 3 compartment osteoarthritis, most significant in the medial compartment. 3. Limited evaluation for joint effusion due to suboptimal lateral view. Electronically signed by: Rockey Kilts MD 10/21/2023 09:39 AM EDT RP Workstation: [id number]   CT Maxillofacial Wo Contrast Result Date: 10/21/2023 CLINICAL DATA:  63 year old female status post fall last night. Pain. EXAM: CT MAXILLOFACIAL WITHOUT CONTRAST TECHNIQUE: Multidetector CT imaging of the maxillofacial structures was performed. Multiplanar CT image reconstructions were also generated. RADIATION DOSE REDUCTION: This exam was performed according to the departmental dose-optimization program which includes automated exposure control, adjustment of the mA and/or kV according to patient size and/or use of iterative reconstruction technique. COMPARISON:  CT head and cervical spine today. Brain MRI 08/03/2014. FINDINGS: Osseous: Mandible intact and normally located. Bilateral maxilla, zygoma, pterygoid, and nasal bones appear intact. Central skull base appears intact. Orbits: Intact orbital walls. Globes and intraorbital soft tissues remain normal. Left infraorbital, inferior preseptal and premalar space soft tissue swelling and stranding compatible with hematoma/contusion. No soft tissue gas. Sinuses: Clear throughout. Soft tissues: Mild streak artifact from cervical ACDF hardware.  Negative visible noncontrast larynx, pharynx, parapharyngeal spaces, retropharyngeal space (retropharyngeal course of both carotids, normal variant), sublingual space, submandibular spaces, masticator and parotid spaces. Calcified cervical carotid atherosclerosis. Limited intracranial: Reported separately. IMPRESSION: Left face infraorbital and premalar superficial soft tissue hematoma/contusion. No facial fracture identified. Electronically Signed   By: VEAR Hurst M.D.   On: 10/21/2023 09:23   CT Cervical Spine Wo Contrast Result Date: 10/21/2023 CLINICAL DATA:  Neck trauma, midline tenderness (Age 9-64y) EXAM: CT CERVICAL SPINE WITHOUT CONTRAST TECHNIQUE: Multidetector CT imaging of the cervical spine was performed without intravenous contrast. Multiplanar CT image reconstructions were also generated. RADIATION DOSE REDUCTION: This exam was performed according to the departmental dose-optimization program which includes automated exposure control, adjustment of the mA and/or kV according to patient size and/or use of iterative reconstruction technique. COMPARISON:  Cervical MRI 01/16/2017.  Radiographs 05/06/2014. FINDINGS: Alignment: Normal. Skull base and vertebrae: No evidence of acute fracture or traumatic subluxation. Remote postsurgical changes from C5-6 ACDF with intact hardware and solid interbody ankylosis. Incomplete posterior arch of C1 (normal variant). Soft tissues and spinal canal: No prevertebral fluid or swelling. No visible canal hematoma. Disc levels: No evidence of large disc herniation or high-grade spinal stenosis. Chronic ossification of the posterior longitudinal ligament at C7 and T1, similar to previous MRI. Asymmetric moderate facet arthropathy on the left at C4-5 with resulting moderate left foraminal narrowing. Upper chest: Aberrant right subclavian artery noted. Clear lung apices. Other: Bilateral carotid atherosclerosis. IMPRESSION: 1. No evidence of acute cervical spine fracture,  traumatic subluxation or static signs of instability. 2. Remote postsurgical changes from C5-6 ACDF with intact hardware and solid interbody ankylosis. 3. Chronic ossification of the posterior longitudinal ligament at C7 and T1, similar to previous MRI. 4. Asymmetric facet arthropathy on the left at C4-5 with resulting moderate foraminal narrowing. Electronically Signed   By: Elsie Perone M.D.   On: 10/21/2023 09:22   CT Head Wo Contrast Result Date: 10/21/2023 CLINICAL DATA:  63 year old female status post fall last night. Pain. EXAM: CT HEAD WITHOUT CONTRAST TECHNIQUE: Contiguous axial images were obtained from the base of the skull through the vertex without intravenous contrast.  RADIATION DOSE REDUCTION: This exam was performed according to the departmental dose-optimization program which includes automated exposure control, adjustment of the mA and/or kV according to patient size and/or use of iterative reconstruction technique. COMPARISON:  Brain MRI 08/03/2014. Head CT 01/26/2014. Face and cervical spine today reported separately. FINDINGS: Brain: Chronic dural calcifications, especially along the left middle cranial fossa, unchanged since 2015. Cerebral volume remains normal for age. No midline shift, ventriculomegaly, mass effect, evidence of mass lesion, intracranial hemorrhage or evidence of cortically based acute infarction. Mild for age scattered cerebral white matter hypodensity with some deep white matter capsule involvement. Inferior right lentiform perivascular space, normal variant. Vascular: Calcified atherosclerosis at the skull base. No suspicious intracranial vascular hyperdensity. Skull: No acute fracture identified, calvarium appears stable and intact. Face reported separately. Sinuses/Orbits: Visualized paranasal sinuses and mastoids are clear. Other: Left premalar soft tissue swelling, face reported separately. Scalp soft tissues appears stable and within normal limits. IMPRESSION: 1.  No acute traumatic injury identified. Face and cervical spine reported separately. 2. Mild for age chronic cerebral white matter changes. Electronically Signed   By: VEAR Hurst M.D.   On: 10/21/2023 09:20     Procedures   Medications Ordered in the ED  lidocaine  (LIDODERM ) 5 % 1 patch (1 patch Transdermal Patch Applied 10/21/23 1117)  oxyCODONE  (Oxy IR/ROXICODONE ) immediate release tablet 10 mg (10 mg Oral Given 10/21/23 0828)  ketorolac  (TORADOL ) injection 30 mg (30 mg Intramuscular Given 10/21/23 1117)  HYDROmorphone  (DILAUDID ) injection 2 mg (2 mg Intramuscular Given 10/21/23 1118)                                    Medical Decision Making Amount and/or Complexity of Data Reviewed Radiology: ordered and independent interpretation performed. Decision-making details documented in ED Course.  Risk Prescription drug management.   Pt with multiple medical problems and comorbidities and presenting today with a complaint that caries a high risk for morbidity and mortality.  Here today after a fall.  Patient has signs of injury to the left side of the body.  Locations are specified above.  She is mentating normally and feel that the fall was mechanical.  She takes no anticoagulation.  Neurovascularly intact. Pt with multiple medical problems and comorbidities and presenting today with a complaint that caries a high risk for morbidity and mortality.  Head CT without evidence of bleed and facial CT without signs of fracture.  Cervical spine with significant arthritis.  Radiology read ports chronic white matter changes but no acute traumatic injury in the head or face.  Cervical spine without acute traumatic injury but postsurgical changes with intact hardware and chronic ossification which is unchanged from prior imaging.  Plain films of the ribs, left shoulder and knee without acute findings.  Radiology did report some mild increased atelectasis at the lung base most likely because patient is not taking deep  breaths due to pain, degenerative changes at the shoulder and knee.  Left hand with a first metacarpal fracture which radiology reports this fracture is intra-articular and mildly displaced with associated mild radial subluxation of the first metacarpal joint as well as mildly displaced intra-articular fracture at the base of the first distal phalanx.  Patient was placed in a thumb spica splint and will need follow-up with hand surgery.  She was given pain control here but given she is under a pain contract she will call her pain doctor  for adjustment of her pain medications.       Final diagnoses:  Closed nondisplaced fracture of base of first metacarpal bone of left hand, unspecified fracture morphology, initial encounter  Closed displaced fracture of distal phalanx of left thumb, initial encounter  Fall, initial encounter  Facial contusion, initial encounter  Contusion of left shoulder, initial encounter  Rib contusion, left, initial encounter  Contusion of left knee, initial encounter    ED Discharge Orders     None          Doretha Folks, MD 10/21/23 1512

## 2023-10-21 NOTE — Discharge Instructions (Addendum)
 You broke 2 bones in your thumb and you will need to wear the splint until you see the specialist.  You will need to keep this dry.  The rest of the areas where you are hurting appear to be just badly bruised.  You will need to continue your current medications but call your pain doctor as you are going to most likely need more medication in the meantime because the pain will be more than what you are used to.

## 2023-10-21 NOTE — ED Triage Notes (Signed)
 Mechanical fall last night, C/o left side of face, left shoulder, left ribs, left wrist, left hand, and left knee. Denies LOC. Denies Thinners.   Patient arrived wearing ACE bandage.

## 2023-10-23 ENCOUNTER — Ambulatory Visit (INDEPENDENT_AMBULATORY_CARE_PROVIDER_SITE_OTHER): Admitting: Orthopedic Surgery

## 2023-10-23 DIAGNOSIS — S62522A Displaced fracture of distal phalanx of left thumb, initial encounter for closed fracture: Secondary | ICD-10-CM

## 2023-10-23 DIAGNOSIS — S62212A Bennett's fracture, left hand, initial encounter for closed fracture: Secondary | ICD-10-CM

## 2023-10-23 NOTE — Progress Notes (Signed)
 Misty [name] - 63 y.o. female MRN [id number]  Date of birth: 04-15-1960  Office Visit Note: Visit Date: 10/23/2023 PCP: Geofm Glade PARAS, MD Referred by: Geofm Glade PARAS, MD  Subjective: No chief complaint on file.  HPI: Misty [name] is a pleasant 63 y.o. female who presents today for evaluation of a left hand injury sustained 3 days prior.  Injury mechanism described as mechanical fall onto the outstretched left hand, majority of the trauma to the left thumb.  She was seen in the emergency department setting, underwent clinical radiographic workup which showed a displaced intra-articular fracture involving the distal phalanx, as well as a mildly displaced intra-articular fracture of the first metacarpal.  Pertinent ROS were reviewed with the patient and found to be negative unless otherwise specified above in HPI.   Visit Reason: left hand fx Duration of symptoms:3 days Hand dominance: right Occupation:retired Diabetic:  Prediabetes  Smoking: Yes-recreational  Heart/Lung History:HYPERTENSION, BENIGN ESSENTIAL  Blood Thinners: none  Prior Testing/EMG:xray Injections (Date):none Treatments:splint Prior Surgery:none  Assessment & Plan: Visit Diagnoses:  1. Closed Bennett's fracture of left thumb, initial encounter   2. Closed displaced fracture of distal phalanx of left thumb, initial encounter     Plan: Extensive discussion was held with patient and her daughter today regarding her left thumb injury.  We reviewed the results of the x-rays which did show displaced fractures of both the distal phalanx and first metacarpal base.  Given the displaced nature of these injuries and intra-articular involvement, we discussed indications for surgery in order to restore anatomy, promote healing and early mobilization.  We also did discuss conservative treatments with ongoing immobilization however we did discuss the potential for malunion versus nonunion and the potential for lack  of range of motion without restoration of anatomy.  Risk and benefits of surgery were discussed in detail as well with both patient and daughter today, they expressed understanding.  Understanding the options, patient would like to move forward with surgical planning.  Will plan for surgical scheduling of closed reduction and percutaneous pinning of the left thumb metacarpal base fracture as well as the distal phalanx fracture.  Surgery will be performed next week.  Risks and benefits of the procedure were discussed, risks including but not limited to infection, bleeding, scarring, stiffness, nerve injury, tendon injury, vascular injury, hardware complication, malunion, nonunion, recurrence of symptoms and need for subsequent operation.  We also discussed the appropriate postoperative protocol and timeframe for return to activities and function.  Patient expressed understanding.     Follow-up: No follow-ups on file.   Meds & Orders: No orders of the defined types were placed in this encounter.  No orders of the defined types were placed in this encounter.    Procedures: No procedures performed      Clinical History: No specialty comments available.  She reports that she quit smoking about 5 years ago. Her smoking use included cigarettes. She started smoking about 25 years ago. She has a 5 pack-year smoking history. She has never used smokeless tobacco.  Recent Labs    04/19/23 0854 10/17/23 0903  HGBA1C 5.6 5.5    Objective:   Vital Signs: There were no vitals taken for this visit.  Physical Exam  Gen: Well-appearing, in no acute distress; non-toxic CV: Regular Rate. Well-perfused. Warm.  Resp: Breathing unlabored on room air; no wheezing. Psych: Fluid speech in conversation; appropriate affect; normal thought process  Ortho Exam Left hand: - Notable tenderness over the  distal aspect of the left thumb with associated swelling, also pain elicited at the thumb metacarpal base region  with associated swelling - Skin is intact sensation intact distally throughout the thumb, normal color and capillary refill  Imaging: No results found. X-rays of the left thumb in chart, independently reviewed  Past Medical/Family/Surgical/Social History: Medications & Allergies reviewed per EMR, new medications updated. Patient Active Problem List   Diagnosis Date Noted   Obesity (BMI 30.0-34.9) 10/17/2023   S/P TKR (total knee replacement), right  04/2022 10/17/2023   GERD (gastroesophageal reflux disease) 04/19/2023   Rib pain on right side 02/06/2023   Iron  deficiency anemia 10/16/2021   CKD (chronic kidney disease) stage 3, GFR 30-59 ml/min (HCC) 10/15/2021   RUQ pain 10/12/2021   S/P hip replacement, bilateral 05/30/2021   Pain due to onychomycosis of toenail of right foot 04/25/2021   Osteoarthritis of right hip 01/25/2021   Avascular necrosis of femoral head (HCC) 01/25/2021   Toxic neuropathy (HCC) 07/08/2020   Osteoarthritis of left hip 02/19/2020   COVID-19 virus infection 04/18/2019   Restless leg syndrome 03/10/2019   Hyperlipidemia 11/27/2018   Anemia 11/25/2018   Nausea 09/17/2018   Right shoulder pain 03/03/2018   Pancreatitis 02/22/2018   Alcohol  abuse 02/22/2018   Cholelithiasis 02/22/2018   Adhesive capsulitis of right shoulder 01/20/2018   Ventral hernia 09/16/2017   Prediabetes 04/16/2017   Spinal stenosis of lumbar region 11/29/2015   Positive urine drug screen 11/16/2015   Umbilical hernia 10/12/2015   Sleeping difficulties 07/15/2015   Bunion, right 06/20/2015   B12 deficiency 03/30/2015   Hammer toe of left foot 02/16/2015   Fibrosis of skin of lower extremity 02/16/2015   Cervical spondylosis with radiculopathy 03/01/2014   Hammer toe of right foot 01/26/2014   Benign intracranial hypertension 06/18/2013   Porokeratosis 03/31/2013   Vitamin D  deficiency 04/18/2010   MEDIAL MENISCUS TEAR, LEFT 06/30/2008   Current tear knee, medial meniscus  06/30/2008   BARRETTS ESOPHAGUS 06/28/2008   Osteoarthritis of right knee 06/28/2008   HYPERTENSION, BENIGN ESSENTIAL 05/24/2008   Irritable bowel syndrome 05/24/2008   BARIATRIC SURGERY STATUS 03/19/2006   History of malignant neoplasm of large intestine 03/19/1997   Past Medical History:  Diagnosis Date   Allergy    Arthritis    Back pain    Colon cancer (HCC)    Depression    Diarrhea    GERD (gastroesophageal reflux disease)    Hypertension    Insomnia    MS (multiple sclerosis) (HCC) 2000   pt says that she doesn't have it   Other hammer toe (acquired) 03/31/2013   Pneumonia    hx of   Pre-diabetes    Umbilical hernia    Family History  Problem Relation Age of Onset   Stroke Mother    Hypertension Mother    Hyperlipidemia Mother    Diabetes Mother    Diabetes Brother    Hypertension Brother    Hypertension Brother    Hypertension Brother    Hypertension Brother    Hypertension Brother    Alcoholism Brother    Breast cancer Neg Hx    Past Surgical History:  Procedure Laterality Date   ABDOMINAL HYSTERECTOMY     ANTERIOR CERVICAL DECOMP/DISCECTOMY FUSION N/A 03/01/2014   Procedure: ANTERIOR CERVICAL DECOMPRESSION/DISCECTOMY FUSION 1 LEVEL;  Surgeon: Victory DELENA Gunnels, MD;  Location: [facility];  Service: Neurosurgery;  Laterality: N/A;  ANTERIOR CERVICAL DECOMPRESSION/DISCECTOMY FUSION 1 LEVEL   BUNIONECTOMY Right 10/14/2014   @  [facility]   CESAREAN SECTION     COLON SURGERY     COLONOSCOPY     FOOT SURGERY Right    GASTRIC BYPASS     Hammer Toe Repair Right 10/14/2014   RT #2, @[facility]    INCISION AND DRAINAGE HIP Left 06/02/2020   Procedure: IRRIGATION AND DEBRIDEMENT LEFT HIP, POSSIBLE HEAD BALL AND LINER EXCHANGE;  Surgeon: Fidel Rogue, MD;  Location: [facility];  Service: Orthopedics;  Laterality: Left;    KNEE ARTHROPLASTY Right 06/14/2022   Procedure: COMPUTER ASSISTED TOTAL KNEE ARTHROPLASTY;  Surgeon: Fidel Rogue, MD;  Location: [facility];  Service:  Orthopedics;  Laterality: Right;  160   TENOTOMY Right 10/14/2014   RT #3, @[facility]    TOTAL HIP ARTHROPLASTY Left 04/28/2020   Procedure: TOTAL HIP ARTHROPLASTY ANTERIOR APPROACH;  Surgeon: Fidel Rogue, MD;  Location: [facility];  Service: Orthopedics;  Laterality: Left;  3E bed   TOTAL HIP ARTHROPLASTY Right 01/25/2021   Procedure: TOTAL HIP ARTHROPLASTY ANTERIOR APPROACH;  Surgeon: Fidel Rogue, MD;  Location: [facility];  Service: Orthopedics;  Laterality: Right;   Social History   Occupational History   Occupation: Disability  Tobacco Use   Smoking status: Former    Current packs/day: 0.00    Average packs/day: 0.3 packs/day for 20.0 years (5.0 ttl pk-yrs)    Types: Cigarettes    Start date: 09/22/1998    Quit date: 09/22/2018    Years since quitting: 5.0   Smokeless tobacco: Never   Tobacco comments:    states she quit 3 months ago  Vaping Use   Vaping status: Never Used  Substance and Sexual Activity   Alcohol  use: Not Currently    Alcohol /week: 1.0 standard drink of alcohol     Types: 1 Glasses of wine per week    Comment: social   Drug use: No   Sexual activity: Yes    Partners: Male    Birth control/protection: Surgical    Ashira Kirsten Estela) Zainah Steven, M.D. [facility], Hand Surgery

## 2023-10-25 ENCOUNTER — Ambulatory Visit (INDEPENDENT_AMBULATORY_CARE_PROVIDER_SITE_OTHER): Admitting: Orthopedic Surgery

## 2023-10-25 DIAGNOSIS — S62212A Bennett's fracture, left hand, initial encounter for closed fracture: Secondary | ICD-10-CM

## 2023-10-25 NOTE — Progress Notes (Signed)
 Patient came in today for a new splint. Thumb spica splint re-applied

## 2023-10-28 ENCOUNTER — Ambulatory Visit (INDEPENDENT_AMBULATORY_CARE_PROVIDER_SITE_OTHER): Admitting: Podiatry

## 2023-10-28 ENCOUNTER — Encounter: Payer: Self-pay | Admitting: Podiatry

## 2023-10-28 ENCOUNTER — Ambulatory Visit: Payer: Self-pay

## 2023-10-28 ENCOUNTER — Encounter: Payer: Self-pay | Admitting: Internal Medicine

## 2023-10-28 ENCOUNTER — Ambulatory Visit: Admitting: Podiatry

## 2023-10-28 DIAGNOSIS — Q828 Other specified congenital malformations of skin: Secondary | ICD-10-CM | POA: Diagnosis not present

## 2023-10-28 DIAGNOSIS — N1832 Chronic kidney disease, stage 3b: Secondary | ICD-10-CM

## 2023-10-28 NOTE — Telephone Encounter (Signed)
 FYI Only or Action Required?: FYI only for provider.  Patient was last seen in primary care on 10/17/2023 by Geofm Glade PARAS, MD.  Called Nurse Triage reporting Facial Pain.  Symptoms began several days ago.  Interventions attempted: Prescription medications: Oxycodone .  Symptoms are: unchanged.  Triage Disposition: See Physician Within 24 Hours  Patient/caregiver understands and will follow disposition?: Yes  **Patient scheduled for 8/12**            Copied from CRM #[id number]. Topic: Clinical - Red Word Triage >> Oct 28, 2023  1:53 PM Deleta RAMAN wrote: Red Word that prompted transfer to Nurse Triage: patient had a fall on last Monday she was seen in the er. Mention she is still in pain from the fall has facial pain and Jaw pain. Hand broken as well scheduled for surgery for hand pain. Pain is traveling through body. Reason for Disposition  [1] MODERATE pain (e.g., interferes with normal activities) AND [2] constant AND [3] present > 24 hours  Answer Assessment - Initial Assessment Questions 1. ONSET: When did the pain start? (e.g., minutes, hours, days)      Fall last Monday, seen in ED. Still having facial pain/jaw pain  2. ONSET: Does the pain come and go, or has it been constant since it started? (e.g., constant, intermittent, fleeting)     Constant   3. SEVERITY: How bad is the pain? (Scale 1-10; mild, moderate or severe)     6/10  4. LOCATION: Where does it hurt?      Facial area/jaw  5. RASH: Is there any redness, rash, or swelling of your face?     No,bruising noted  6. FEVER: Do you have a fever? If Yes, ask: What is it, how was it measured, and when did it start?      No   7. OTHER SYMPTOMS: Do you have any other symptoms? (e.g., fever, toothache, nasal discharge, nasal congestion, clicking sensation in jaw joint)  No     Being seen by pain management; oxycodone  prescribed.  Protocols used: Face Pain-A-AH

## 2023-10-28 NOTE — Progress Notes (Signed)
 Subjective:    Patient ID: Misty [name], female    DOB: 1960-07-17, 63 y.o.   MRN: [id number]     HPI Misty [name] is here for follow up from the hospital  Ed 8/4 - presented with pain on left side of body after fall the night prior.  She lost her balance and fell down 4 stairs on to cement.  No LOC.  No z/c.  Was able to get up but had pain in left side of her face, shoulder, wrist, hand and left knee.  She had a mild headache.  She had left-sided rib pain that was removed with deep breaths.  She did take her chronic pain medications, was still having pain.  CT head showed no acute injuries.  CT cervical spine showed no acute injuries.  CT maxillofacial showed superficial soft tissue hematoma left infraorbital and pre-Maller area.  Ribs, left showed no evidence of acute rib fracture.  Wrist x-ray showed mild displacement intra-articular fracture involving the base of first metacarpal with associated mild radial subluxation at the first Cardiel metacarpal joint, mildly displaced intra-articular fracture of the base of the first distal phalanx.  Left shoulder x-ray without acute injury, left knee x-ray without acute injury.  She received pain medication while in the ED.  Upon discharge she was advised to discuss pain management with her pain doctor.   Medications and allergies reviewed with patient and updated if appropriate.  Current Outpatient Medications on File Prior to Visit  Medication Sig Dispense Refill   albuterol  (PROAIR  HFA) 108 (90 Base) MCG/ACT inhaler Inhale 1-2 puffs into the lungs every 6 (six) hours as needed for wheezing or shortness of breath. 1 each 5   ALPRAZolam  (XANAX ) 0.5 MG tablet Take 1 tablet (0.5 mg total) by mouth at bedtime as needed for anxiety. 20 tablet 0   atorvastatin  (LIPITOR) 20 MG tablet Take 1 tablet by mouth once daily 90 tablet 0   Cholecalciferol  (VITAMIN D3) 50 MCG (2000 UT) TABS TAKE 1 TABLET BY MOUTH EVERY DAY 90 tablet 3   cyanocobalamin   (,VITAMIN B-12,) 1000 MCG/ML injection Inject 1,000 mcg into the muscle every 30 (thirty) days.     diclofenac  Sodium (VOLTAREN ) 1 % GEL Apply 2 g topically 4 (four) times daily. APPLY 2 GRAMS TOPICALLY 4 TIMES DAILY 100 g 5   ferrous gluconate  (FERGON) 324 MG tablet Take 1 tablet (324 mg total) by mouth daily.  3   gabapentin  (NEURONTIN ) 300 MG capsule TAKE 3 CAPSULES BY MOUTH THREE TIMES DAILY 270 capsule 0   lisinopril  (ZESTRIL ) 20 MG tablet Take 1 tablet by mouth once daily 90 tablet 0   MAXITROL 3.5-10000-0.1 OINT SMARTSIG:1 sparingly In Eye(s) Twice Daily     MELATONIN PO Take 1 tablet by mouth as needed (sleep).     methocarbamol  (ROBAXIN ) 500 MG tablet TAKE 1 TABLET BY MOUTH EVERY 8 HOURS AS NEEDED FOR MUSCLE SPASM 270 tablet 1   naloxone  (NARCAN ) nasal spray 4 mg/0.1 mL Place 1 spray into the nose as needed (opioid overdose).     omeprazole  (PRILOSEC) 20 MG capsule Take 1 capsule (20 mg total) by mouth daily. Take 1 capsule by mouth 30 minutes prior to a meal once a day     ondansetron  (ZOFRAN ) 4 MG tablet Take 1 tablet (4 mg total) by mouth every 8 (eight) hours as needed for nausea or vomiting. 30 tablet 8   oxyCODONE -acetaminophen  (PERCOCET) 10-325 MG tablet Take 1 tablet by mouth 4 (  NARCAN ) nasal spray 4 mg/0.1 mL Place 1 spray into the nose as needed (opioid overdose).     omeprazole  (PRILOSEC) 20 MG capsule Take 1 capsule (20 mg total) by mouth daily. Take 1 capsule by mouth 30 minutes prior to a meal once a day     ondansetron  (ZOFRAN ) 4 MG tablet Take 1 tablet (4 mg total) by mouth every 8 (eight) hours as needed for nausea or vomiting. 30 tablet 8   oxyCODONE -acetaminophen  (PERCOCET) 10-325 MG tablet Take 1 tablet by mouth 4 (four) times daily as needed.     Semaglutide , 2 MG/DOSE, (OZEMPIC , 2 MG/DOSE,) 8 MG/3ML SOPN INJECT 2MG  SUBCUTANEOUSLY ONCE A WEEK 3 mL 4   Syringe/Needle, Disp, (SYRINGE 3CC/25GX1) 25G X 1 3 ML MISC Use for monthly B12 injections 3 each 3   Turmeric (QC TUMERIC COMPLEX PO) Take 1 tablet by  mouth daily.     XTAMPZA  ER 18 MG C12A Take 18 mg by mouth every 12 (twelve) hours.     No current facility-administered medications on file prior to visit.     Review of Systems  Eyes:  Positive for visual disturbance (blurry vision in left eye since fall).  Musculoskeletal:  Positive for arthralgias and back pain.  Neurological:  Negative for dizziness and headaches.       Objective:   Vitals:   10/29/23 1521  BP: (!) 134/92  Pulse: 80  Temp: 98.1 F (36.7 C)  SpO2: 97%   BP Readings from Last 3 Encounters:  10/29/23 (!) 134/92  10/21/23 137/77  10/17/23 120/70   Wt Readings from Last 3 Encounters:  10/29/23 209 lb (94.8 kg)  10/17/23 211 lb (95.7 kg)  10/07/23 213 lb (96.6 kg)   Body mass index is 34.25 kg/m.    Physical Exam Constitutional:      General: She is not in acute distress.    Appearance: Normal appearance. She is not ill-appearing.  HENT:     Head: Normocephalic.     Comments: Bruising left orbit Cardiovascular:     Rate and Rhythm: Normal rate and regular rhythm.  Pulmonary:     Effort: Pulmonary effort is normal. No respiratory distress.     Breath sounds: Normal breath sounds.  Chest:     Chest wall: Tenderness (left lateral ribs) present.  Musculoskeletal:     Right lower leg: No edema.     Left lower leg: No edema.  Skin:    General: Skin is warm and dry.     Findings: Bruising (left posterior and lateral shoulder, left upper arm) present.  Neurological:     Mental Status: She is alert.        Lab Results  Component Value Date   WBC 5.3 10/17/2023   HGB 11.5 (L) 10/17/2023   HCT 36.0 10/17/2023   PLT 227.0 10/17/2023   GLUCOSE 81 10/17/2023   CHOL 174 10/17/2023   TRIG 86.0 10/17/2023   HDL 82.20 10/17/2023   LDLCALC 74 10/17/2023   ALT 13 10/17/2023   AST 17 10/17/2023   NA 138 10/17/2023   K 4.4 10/17/2023   CL 107 10/17/2023   CREATININE 0.72 10/17/2023   BUN 23 10/17/2023   CO2 25 10/17/2023   TSH 0.39  04/19/2023   INR 1.0 01/19/2021   HGBA1C 5.5 10/17/2023   DG Hand Complete Left CLINICAL DATA:  Fall last night.  Pain in the left hand and wrist.  EXAM: LEFT WRIST - COMPLETE 3+ VIEW; LEFT HAND - COMPLETE  fracture of the base of the 1st distal phalanx.  Electronically Signed   By: Elsie Perone M.D.   On: 10/21/2023 10:01 DG Shoulder Left CLINICAL DATA:  Left shoulder pain after falling last night.  EXAM: LEFT SHOULDER - 2+ VIEW  COMPARISON:  None Available.  FINDINGS: The mineralization and alignment are normal. There is no evidence of acute fracture or dislocation. Mild acromioclavicular and glenohumeral degenerative changes. The subacromial space appears adequately preserved. Previous lower cervical fusion noted.  IMPRESSION: No evidence of acute fracture or dislocation. Mild degenerative changes.  Electronically Signed   By: Elsie Perone M.D.   On: 10/21/2023 09:57 DG Ribs Unilateral W/Chest Left CLINICAL DATA:  Left rib pain after falling.  EXAM: LEFT RIBS AND CHEST - 3+ VIEW  COMPARISON:  Chest radiographs 08/18/2015 and 12/09/2010.  FINDINGS: The heart size and mediastinal contours are stable with mild aortic atherosclerosis. Mildly increased atelectasis or scarring at the left lung base. No confluent airspace disease, pleural effusion or pneumothorax. No acute left-sided rib fractures are identified. Patient is status post lower cervical  fusion.  IMPRESSION: No evidence of acute left-sided rib fracture, pleural effusion or pneumothorax. Mildly increased atelectasis or scarring at the left lung base.  Electronically Signed   By: Elsie Perone M.D.   On: 10/21/2023 09:56 DG Knee Complete 4 Views Left EXAM: 4 VIEW(S) XRAY OF THE LEFT KNEE 10/21/2023 09:24:58 AM  COMPARISON: None available.  CLINICAL HISTORY: Mechanical fall last night, C/o left side of face, left shoulder, left ribs, left wrist, left hand, and left knee. Denies LOC. Denies Thinners.  FINDINGS:  BONES AND JOINTS: No acute fracture. No focal osseous lesion. No joint dislocation. 3 compartment osteoarthritis, most significant in the medial compartment. Limited evaluation for joint effusion.  SOFT TISSUES: The soft tissues are unremarkable.  IMPRESSION: 1. No acute findings. 2. 3 compartment osteoarthritis, most significant in the medial compartment. 3. Limited evaluation for joint effusion due to suboptimal lateral view.  Electronically signed by: Rockey Kilts MD 10/21/2023 09:39 AM EDT RP Workstation: [id number] CT Maxillofacial Wo Contrast CLINICAL DATA:  63 year old female status post fall last night. Pain.  EXAM: CT MAXILLOFACIAL WITHOUT CONTRAST  TECHNIQUE: Multidetector CT imaging of the maxillofacial structures was performed. Multiplanar CT image reconstructions were also generated.  RADIATION DOSE REDUCTION: This exam was performed according to the departmental dose-optimization program which includes automated exposure control, adjustment of the mA and/or kV according to patient size and/or use of iterative reconstruction technique.  COMPARISON:  CT head and cervical spine today. Brain MRI 08/03/2014.  FINDINGS: Osseous: Mandible intact and normally located. Bilateral maxilla, zygoma, pterygoid, and nasal bones appear intact. Central skull base appears intact.  Orbits: Intact orbital walls. Globes and intraorbital  soft tissues remain normal. Left infraorbital, inferior preseptal and premalar space soft tissue swelling and stranding compatible with hematoma/contusion. No soft tissue gas.  Sinuses: Clear throughout.  Soft tissues: Mild streak artifact from cervical ACDF hardware. Negative visible noncontrast larynx, pharynx, parapharyngeal spaces, retropharyngeal space (retropharyngeal course of both carotids, normal variant), sublingual space, submandibular spaces, masticator and parotid spaces. Calcified cervical carotid atherosclerosis.  Limited intracranial: Reported separately.  IMPRESSION: Left face infraorbital and premalar superficial soft tissue hematoma/contusion. No facial fracture identified.  Electronically Signed   By: VEAR Hurst M.D.   On: 10/21/2023 09:23 CT Cervical Spine Wo Contrast CLINICAL DATA:  Neck trauma, midline tenderness (Age 39-64y)  EXAM: CT CERVICAL SPINE WITHOUT CONTRAST  TECHNIQUE: Multidetector CT imaging of the cervical spine was  performed without intravenous contrast. Multiplanar CT image reconstructions were also generated.  RADIATION DOSE REDUCTION: This exam was performed according to the departmental dose-optimization program which includes automated exposure control, adjustment of the mA and/or kV according to patient size and/or use of iterative reconstruction technique.  COMPARISON:  Cervical MRI 01/16/2017.  Radiographs 05/06/2014.  FINDINGS: Alignment: Normal.  Skull base and vertebrae: No evidence of acute fracture or traumatic subluxation. Remote postsurgical changes from C5-6 ACDF with intact hardware and solid interbody ankylosis. Incomplete posterior arch of C1 (normal variant).  Soft tissues and spinal canal: No prevertebral fluid or swelling. No visible canal hematoma.  Disc levels: No evidence of large disc herniation or high-grade spinal stenosis. Chronic ossification of the posterior longitudinal ligament at C7 and T1,  similar to previous MRI. Asymmetric moderate facet arthropathy on the left at C4-5 with resulting moderate left foraminal narrowing.  Upper chest: Aberrant right subclavian artery noted. Clear lung apices.  Other: Bilateral carotid atherosclerosis.  IMPRESSION: 1. No evidence of acute cervical spine fracture, traumatic subluxation or static signs of instability. 2. Remote postsurgical changes from C5-6 ACDF with intact hardware and solid interbody ankylosis. 3. Chronic ossification of the posterior longitudinal ligament at C7 and T1, similar to previous MRI. 4. Asymmetric facet arthropathy on the left at C4-5 with resulting moderate foraminal narrowing.  Electronically Signed   By: Elsie Perone M.D.   On: 10/21/2023 09:22 CT Head Wo Contrast CLINICAL DATA:  63 year old female status post fall last night. Pain.  EXAM: CT HEAD WITHOUT CONTRAST  TECHNIQUE: Contiguous axial images were obtained from the base of the skull through the vertex without intravenous contrast.  RADIATION DOSE REDUCTION: This exam was performed according to the departmental dose-optimization program which includes automated exposure control, adjustment of the mA and/or kV according to patient size and/or use of iterative reconstruction technique.  COMPARISON:  Brain MRI 08/03/2014. Head CT 01/26/2014. Face and cervical spine today reported separately.  FINDINGS: Brain: Chronic dural calcifications, especially along the left middle cranial fossa, unchanged since 2015. Cerebral volume remains normal for age.  No midline shift, ventriculomegaly, mass effect, evidence of mass lesion, intracranial hemorrhage or evidence of cortically based acute infarction. Mild for age scattered cerebral white matter hypodensity with some deep white matter capsule involvement. Inferior right lentiform perivascular space, normal variant.  Vascular: Calcified atherosclerosis at the skull base. No  suspicious intracranial vascular hyperdensity.  Skull: No acute fracture identified, calvarium appears stable and intact. Face reported separately.  Sinuses/Orbits: Visualized paranasal sinuses and mastoids are clear.  Other: Left premalar soft tissue swelling, face reported separately. Scalp soft tissues appears stable and within normal limits.  IMPRESSION: 1. No acute traumatic injury identified. Face and cervical spine reported separately. 2. Mild for age chronic cerebral white matter changes.  Electronically Signed   By: VEAR Hurst M.D.   On: 10/21/2023 09:20    Assessment & Plan:    See Problem List for Assessment and Plan of chronic medical problems.  iterative reconstruction technique.  COMPARISON:  Brain MRI 08/03/2014. Head CT 01/26/2014. Face and cervical spine today reported separately.  FINDINGS: Brain: Chronic dural calcifications, especially along the left middle cranial fossa, unchanged since 2015. Cerebral volume remains normal for age.  No midline shift, ventriculomegaly, mass effect, evidence of mass lesion, intracranial hemorrhage or evidence of cortically based acute infarction. Mild for age scattered cerebral white matter hypodensity with some deep white matter capsule involvement. Inferior right lentiform perivascular space, normal variant.  Vascular: Calcified atherosclerosis at the skull base. No suspicious intracranial vascular hyperdensity.  Skull: No acute fracture identified, calvarium appears stable and intact. Face reported separately.  Sinuses/Orbits: Visualized paranasal sinuses and mastoids are clear.  Other: Left premalar soft tissue swelling, face reported separately. Scalp soft tissues appears stable and within normal limits.  IMPRESSION: 1. No acute traumatic injury identified. Face and cervical spine reported separately. 2. Mild for age chronic cerebral white matter changes.  Electronically Signed   By: VEAR Hurst M.D.   On: 10/21/2023  09:20    Assessment & Plan:    Encounter Diagnoses  Name Primary?   Rib contusion, left, initial encounter Yes   Acute pain of left shoulder    Left knee pain, unspecified chronicity    Closed displaced fracture of phalanx of left thumb, unspecified phalanx, initial encounter    Acute Related to recent fall Xrays from ED reviewed Fracture of left thumb  - has seen ortho - to go for surgery in 2 days No other fracture - bone contusions, flares of arthritis, bruising Pain control per pain management Continue heat, ice, voltaren  gel Can take methocarbamol  500 mg tid prn Discussed pain will improve but will take time   See Problem List for Assessment and Plan of chronic medical problems.

## 2023-10-28 NOTE — Progress Notes (Signed)
 This patient presents to the office with chief complaint of her surgical right foot right.  She was originally my patient with a painful callus.  She had surgery by Dr.  Magdalen.  She now has healing toes, pain on the outside of her right foot, and toes that are limited in motion. since the surgery.  She presents for evaluation and treatment.  She was seen by Dr.  Tobie who treated her with injection therapy as well as PT.   General Appearance  Alert, conversant and in no acute stress.  Vascular  Dorsalis pedis and posterior tibial  pulses are palpable  bilaterally.  Capillary return is within normal limits  bilaterally. Temperature is within normal limits  bilaterally.  Neurologic  Senn-Weinstein monofilament wire test within normal limits  bilaterally. Muscle power within normal limits bilaterally.  Nails Thick disfigured discolored nails with subungual debris  thick hallux nails  bilaterally. No evidence of bacterial infection or drainage bilaterally.  Orthopedic  No limitations of motion  feet .  No crepitus or effusions noted.  HAV  B/L. Fused IPJ right hallux. Plantar flexed metatarsal right foot. Healing 2,3 and 4 toes right foot.  With better alignment.  Skin  normotropic skin noted bilaterally.  No signs of infections or ulcers noted.  Porokeratosis sub 4 right continues to be painful. Better ROM mpj right foot.     Porokeratosis sub 4 th met right foot.   Debride porokeratosis sub 5th met right foot. With # 15 blade and dremel tool.  Patient has pain on the top of the right foot which causes localized pain.  X-ray review reveal presence of screws in 2,3 right foot.  She says she walks on the outside of her right foot which causes the callus on the outside right foot.  Patient was told to see Dr.  Tobie.       Cordella Bold DPM

## 2023-10-29 ENCOUNTER — Ambulatory Visit (INDEPENDENT_AMBULATORY_CARE_PROVIDER_SITE_OTHER): Admitting: Internal Medicine

## 2023-10-29 ENCOUNTER — Other Ambulatory Visit: Payer: Self-pay

## 2023-10-29 VITALS — BP 134/92 | HR 80 | Temp 98.1°F | Ht 65.5 in | Wt 209.0 lb

## 2023-10-29 DIAGNOSIS — M25512 Pain in left shoulder: Secondary | ICD-10-CM

## 2023-10-29 DIAGNOSIS — M25562 Pain in left knee: Secondary | ICD-10-CM | POA: Diagnosis not present

## 2023-10-29 DIAGNOSIS — S62502A Fracture of unspecified phalanx of left thumb, initial encounter for closed fracture: Secondary | ICD-10-CM

## 2023-10-29 DIAGNOSIS — S62522A Displaced fracture of distal phalanx of left thumb, initial encounter for closed fracture: Secondary | ICD-10-CM

## 2023-10-29 DIAGNOSIS — I10 Essential (primary) hypertension: Secondary | ICD-10-CM

## 2023-10-29 DIAGNOSIS — S20212A Contusion of left front wall of thorax, initial encounter: Secondary | ICD-10-CM | POA: Diagnosis not present

## 2023-10-29 NOTE — Assessment & Plan Note (Signed)
 Chronic BP a little elevated but typically controlled - likely elevated related to pain Continue lisinopril  20 mg daily

## 2023-10-29 NOTE — Patient Instructions (Addendum)
      Medications changes include :   None      Continue using heat, ice and topical medications / patches for your pain.

## 2023-10-30 ENCOUNTER — Telehealth: Payer: Self-pay | Admitting: Podiatry

## 2023-10-30 ENCOUNTER — Other Ambulatory Visit: Payer: Self-pay | Admitting: Internal Medicine

## 2023-10-30 NOTE — Telephone Encounter (Signed)
 Called patient on 10/30/23 to schedule appointment left voicemail, for patient to return call regarding scheduling appointment as per Dr. Tobie

## 2023-10-31 ENCOUNTER — Other Ambulatory Visit: Payer: Self-pay | Admitting: Orthopedic Surgery

## 2023-10-31 DIAGNOSIS — S63042A Subluxation of carpometacarpal joint of left thumb, initial encounter: Secondary | ICD-10-CM | POA: Diagnosis not present

## 2023-10-31 DIAGNOSIS — S62522A Displaced fracture of distal phalanx of left thumb, initial encounter for closed fracture: Secondary | ICD-10-CM | POA: Diagnosis not present

## 2023-10-31 MED ORDER — OXYCODONE HCL 5 MG PO TABS: ORAL_TABLET | ORAL | 0 refills | Status: DC

## 2023-11-07 ENCOUNTER — Telehealth: Payer: Self-pay

## 2023-11-07 ENCOUNTER — Other Ambulatory Visit: Payer: Self-pay | Admitting: Orthopedic Surgery

## 2023-11-07 ENCOUNTER — Telehealth: Payer: Self-pay | Admitting: Orthopedic Surgery

## 2023-11-07 MED ORDER — OXYCODONE HCL 5 MG PO TABS: ORAL_TABLET | ORAL | 0 refills | Status: AC

## 2023-11-07 NOTE — Telephone Encounter (Signed)
 Error

## 2023-11-07 NOTE — Telephone Encounter (Signed)
 Called pt to let them know that their medication was sent into the pharmacy. While on the phone the pt was complaining of burnig around her incision site. She also stated that she had unwrapped part of her splint. I made her an appointment for tomorrow 11/08/23 with Ronal Dragon for a wound check and reapplication of splint.

## 2023-11-07 NOTE — Telephone Encounter (Signed)
 Patient called for and refill on the Oxycodone . CB#[phone]

## 2023-11-08 ENCOUNTER — Encounter (HOSPITAL_BASED_OUTPATIENT_CLINIC_OR_DEPARTMENT_OTHER): Payer: Self-pay | Admitting: Physician Assistant

## 2023-11-08 ENCOUNTER — Ambulatory Visit (INDEPENDENT_AMBULATORY_CARE_PROVIDER_SITE_OTHER): Admitting: Physician Assistant

## 2023-11-08 DIAGNOSIS — S62212A Bennett's fracture, left hand, initial encounter for closed fracture: Secondary | ICD-10-CM

## 2023-11-08 NOTE — Progress Notes (Signed)
 Office Visit Note   Patient: Misty [name]           Date of Birth: [birth year]           MRN: [id number] Visit Date: 11/08/2023              Requested by: Geofm Glade PARAS, MD [address] [place],  [place] [address] PCP: Geofm Glade PARAS, MD  No chief complaint on file.     HPI: Patient is a pleasant 63 year old woman who is 8 days status post pinning of thumb fracture by Dr. Colman.  She comes in today because she unwrapped part of the dressing because it was tight.  Now she is having some burning.  No fever or chills  Assessment & Plan: Visit Diagnoses: Postop  Plan: I did review her unwrap some of the dressing but I did not want to remove the thumb part and she had no evidence of blood her fingertips were warm.  We did rewrap her will follow-up with Dr. Arlinda next week no evidence of infection no evidence of drainage  Follow-Up Instructions: No follow-ups on file.   Ortho Exam  Patient is alert, oriented, no adenopathy, well-dressed, normal affect, normal respiratory effort. Examination of her thumb feels fingers are warm minimal soft tissue swelling no paresthesias.  No evidence of blood or drainage on the dressing.    Imaging: No results found. No images are attached to the encounter.  Labs: Lab Results  Component Value Date   HGBA1C 5.5 10/17/2023   HGBA1C 5.6 04/19/2023   HGBA1C 5.2 06/01/2022   ESRSEDRATE 34 (H) 06/29/2020   ESRSEDRATE 36 (H) 07/12/2017   ESRSEDRATE 20 10/05/2015   CRP 0.5 07/12/2017   CRP 0.2 (L) 10/05/2015   LABURIC 5.9 03/30/2015   REPTSTATUS 06/07/2020 FINAL 06/02/2020   GRAMSTAIN NO WBC SEEN NO ORGANISMS SEEN  06/02/2020   CULT  06/02/2020    RARE PSEUDOMONAS AERUGINOSA NO ANAEROBES ISOLATED Performed at [facility], [address], [place], [place] [address]    LABORGA PSEUDOMONAS AERUGINOSA 06/02/2020     Lab Results  Component Value Date   ALBUMIN  3.9 10/17/2023   ALBUMIN  3.9 04/19/2023   ALBUMIN  3.9  06/01/2022    No results found for: MG Lab Results  Component Value Date   VD25OH 42.69 10/17/2023   VD25OH 24.38 (L) 04/19/2023   VD25OH 44.09 11/30/2021    No results found for: PREALBUMIN    Latest Ref Rng & Units 10/17/2023    9:03 AM 04/19/2023    8:54 AM 06/15/2022    3:13 AM  CBC EXTENDED  WBC 4.0 - 10.5 K/uL 5.3  5.2  6.8   RBC 3.87 - 5.11 Mil/uL 3.97  4.03  3.36   Hemoglobin 12.0 - 15.0 g/dL 88.4  87.8  9.8   HCT 63.9 - 46.0 % 36.0  37.9  33.0   Platelets 150.0 - 400.0 K/uL 227.0  253.0  154   NEUT# 1.4 - 7.7 K/uL 3.1  2.9    Lymph# 0.7 - 4.0 K/uL 1.6  1.7       There is no height or weight on file to calculate BMI.  Orders:  No orders of the defined types were placed in this encounter.  No orders of the defined types were placed in this encounter.    Procedures: No procedures performed  Clinical Data: No additional findings.  ROS:  All other systems negative, except as noted in the HPI. Review  of Systems  Objective: Vital Signs: There were no vitals taken for this visit.  Specialty Comments:  No specialty comments available.  PMFS History: Patient Active Problem List   Diagnosis Date Noted   Obesity (BMI 30.0-34.9) 10/17/2023   S/P TKR (total knee replacement), right  04/2022 10/17/2023   GERD (gastroesophageal reflux disease) 04/19/2023   Rib pain on right side 02/06/2023   Iron  deficiency anemia 10/16/2021   CKD (chronic kidney disease) stage 3, GFR 30-59 ml/min (HCC) 10/15/2021   RUQ pain 10/12/2021   S/P hip replacement, bilateral 05/30/2021   Pain due to onychomycosis of toenail of right foot 04/25/2021   Osteoarthritis of right hip 01/25/2021   Avascular necrosis of femoral head (HCC) 01/25/2021   Toxic neuropathy (HCC) 07/08/2020   Osteoarthritis of left hip 02/19/2020   COVID-19 virus infection 04/18/2019   Restless leg syndrome 03/10/2019   Hyperlipidemia 11/27/2018   Anemia 11/25/2018   Nausea 09/17/2018   Right shoulder  pain 03/03/2018   Pancreatitis 02/22/2018   Alcohol  abuse 02/22/2018   Cholelithiasis 02/22/2018   Adhesive capsulitis of right shoulder 01/20/2018   Ventral hernia 09/16/2017   Prediabetes 04/16/2017   Spinal stenosis of lumbar region 11/29/2015   Positive urine drug screen 11/16/2015   Umbilical hernia 10/12/2015   Sleeping difficulties 07/15/2015   Bunion, right 06/20/2015   B12 deficiency 03/30/2015   Hammer toe of left foot 02/16/2015   Fibrosis of skin of lower extremity 02/16/2015   Cervical spondylosis with radiculopathy 03/01/2014   Hammer toe of right foot 01/26/2014   Benign intracranial hypertension 06/18/2013   Porokeratosis 03/31/2013   Vitamin D  deficiency 04/18/2010   MEDIAL MENISCUS TEAR, LEFT 06/30/2008   Current tear knee, medial meniscus 06/30/2008   BARRETTS ESOPHAGUS 06/28/2008   Osteoarthritis of right knee 06/28/2008   HYPERTENSION, BENIGN ESSENTIAL 05/24/2008   Irritable bowel syndrome 05/24/2008   BARIATRIC SURGERY STATUS 03/19/2006   History of malignant neoplasm of large intestine 03/19/1997   Past Medical History:  Diagnosis Date   Allergy    Arthritis    Back pain    Colon cancer (HCC)    Depression    Diarrhea    GERD (gastroesophageal reflux disease)    Hypertension    Insomnia    MS (multiple sclerosis) (HCC) 2000   pt says that she doesn't have it   Other hammer toe (acquired) 03/31/2013   Pneumonia    hx of   Pre-diabetes    Umbilical hernia     Family History  Problem Relation Age of Onset   Stroke Mother    Hypertension Mother    Hyperlipidemia Mother    Diabetes Mother    Diabetes Brother    Hypertension Brother    Hypertension Brother    Hypertension Brother    Hypertension Brother    Hypertension Brother    Alcoholism Brother    Breast cancer Neg Hx     Past Surgical History:  Procedure Laterality Date   ABDOMINAL HYSTERECTOMY     ANTERIOR CERVICAL DECOMP/DISCECTOMY FUSION N/A 03/01/2014   Procedure: ANTERIOR  CERVICAL DECOMPRESSION/DISCECTOMY FUSION 1 LEVEL;  Surgeon: Victory DELENA Gunnels, MD;  Location: [facility];  Service: Neurosurgery;  Laterality: N/A;  ANTERIOR CERVICAL DECOMPRESSION/DISCECTOMY FUSION 1 LEVEL   BUNIONECTOMY Right 10/14/2014   @[facility]    CESAREAN SECTION     COLON SURGERY     COLONOSCOPY     FOOT SURGERY Right    GASTRIC BYPASS     Hammer Toe Repair  Right 10/14/2014   RT #2, @[facility]    INCISION AND DRAINAGE HIP Left 06/02/2020   Procedure: IRRIGATION AND DEBRIDEMENT LEFT HIP, POSSIBLE HEAD BALL AND LINER EXCHANGE;  Surgeon: Fidel Rogue, MD;  Location: [facility];  Service: Orthopedics;  Laterality: Left;    KNEE ARTHROPLASTY Right 06/14/2022   Procedure: COMPUTER ASSISTED TOTAL KNEE ARTHROPLASTY;  Surgeon: Fidel Rogue, MD;  Location: [facility];  Service: Orthopedics;  Laterality: Right;  160   TENOTOMY Right 10/14/2014   RT #3, @[facility]    TOTAL HIP ARTHROPLASTY Left 04/28/2020   Procedure: TOTAL HIP ARTHROPLASTY ANTERIOR APPROACH;  Surgeon: Fidel Rogue, MD;  Location: [facility];  Service: Orthopedics;  Laterality: Left;  3E bed   TOTAL HIP ARTHROPLASTY Right 01/25/2021   Procedure: TOTAL HIP ARTHROPLASTY ANTERIOR APPROACH;  Surgeon: Fidel Rogue, MD;  Location: [facility];  Service: Orthopedics;  Laterality: Right;   Social History   Occupational History   Occupation: Disability  Tobacco Use   Smoking status: Former    Current packs/day: 0.00    Average packs/day: 0.3 packs/day for 20.0 years (5.0 ttl pk-yrs)    Types: Cigarettes    Start date: 09/22/1998    Quit date: 09/22/2018    Years since quitting: 5.1   Smokeless tobacco: Never   Tobacco comments:    states she quit 3 months ago  Vaping Use   Vaping status: Never Used  Substance and Sexual Activity   Alcohol  use: Not Currently    Alcohol /week: 1.0 standard drink of alcohol     Types: 1 Glasses of wine per week    Comment: social   Drug use: No   Sexual activity: Yes    Partners: Male    Birth control/protection:  Surgical

## 2023-11-14 ENCOUNTER — Ambulatory Visit (INDEPENDENT_AMBULATORY_CARE_PROVIDER_SITE_OTHER): Admitting: Orthopedic Surgery

## 2023-11-14 ENCOUNTER — Other Ambulatory Visit (INDEPENDENT_AMBULATORY_CARE_PROVIDER_SITE_OTHER): Payer: Self-pay

## 2023-11-14 DIAGNOSIS — S62212A Bennett's fracture, left hand, initial encounter for closed fracture: Secondary | ICD-10-CM

## 2023-11-14 NOTE — Progress Notes (Unsigned)
   Misty [name] - 63 y.o. female MRN [id number]  Date of birth: 01/15/1961  Office Visit Note: Visit Date: 11/14/2023 PCP: Geofm Glade PARAS, MD Referred by: Geofm Glade PARAS, MD  Subjective:  HPI: Misty [name] is a 63 y.o. female who presents today for follow up 2 weeks status post left thumb metacarpal fracture and distal phalanx fracture closed reduction with percutaneous pinning.  She is doing well overall, pain is controlled.  Pertinent ROS were reviewed with the patient and found to be negative unless otherwise specified above in HPI.   Assessment & Plan: Visit Diagnoses:  1. Closed Bennett's fracture of left thumb, initial encounter     Plan: Splint was removed today, pin sites remain clean dry and intact.  X-rays demonstrate stable appearance of the multiple fractures throughout the left thumb with appropriate pin fixation.  Will transition to a thumb spica cast for additional 2 weeks.  Return in 2 weeks for repeat clinical and radiographic check, likely pin removal at that time.  She will be seen by occupational therapy at her next visit as well.  Follow-up: No follow-ups on file.   Meds & Orders: No orders of the defined types were placed in this encounter.   Orders Placed This Encounter  Procedures   XR Finger Thumb Left     Procedures: No procedures performed       Objective:   Vital Signs: There were no vitals taken for this visit.  Ortho Exam Left thumb pin sites clean dry and intact, thumb in appropriate alignment, normal color and capillary refill, sensation is intact  Imaging: XR Finger Thumb Left Result Date: 11/15/2023 X-rays left thumb demonstrate stable appearance of the thumb metacarpal and distal phalanx fractures, pins are well-fixed in all planes without evidence of failure or migration.    Kassity Woodson Afton Alderton, M.D. [facility], Hand Surgery

## 2023-11-20 ENCOUNTER — Ambulatory Visit (INDEPENDENT_AMBULATORY_CARE_PROVIDER_SITE_OTHER)

## 2023-11-20 DIAGNOSIS — E538 Deficiency of other specified B group vitamins: Secondary | ICD-10-CM

## 2023-11-20 MED ORDER — CYANOCOBALAMIN 1000 MCG/ML IJ SOLN
1000.0000 ug | Freq: Once | INTRAMUSCULAR | Status: AC
  Administered 2023-11-20: 1000 ug via INTRAMUSCULAR

## 2023-11-20 NOTE — Progress Notes (Signed)
 After obtaining consent, and per orders of Dr. Geofm, injection of B12 given by Ronnald SHAUNNA Palms. Patient instructed to o report any adverse reaction to me immediately.

## 2023-11-27 ENCOUNTER — Other Ambulatory Visit: Payer: Self-pay | Admitting: Internal Medicine

## 2023-11-28 ENCOUNTER — Ambulatory Visit (INDEPENDENT_AMBULATORY_CARE_PROVIDER_SITE_OTHER): Payer: Self-pay

## 2023-11-28 ENCOUNTER — Other Ambulatory Visit: Payer: Self-pay

## 2023-11-28 ENCOUNTER — Encounter: Payer: Self-pay | Admitting: Occupational Therapy

## 2023-11-28 ENCOUNTER — Ambulatory Visit (INDEPENDENT_AMBULATORY_CARE_PROVIDER_SITE_OTHER): Admitting: Orthopedic Surgery

## 2023-11-28 ENCOUNTER — Ambulatory Visit: Attending: Orthopedic Surgery | Admitting: Occupational Therapy

## 2023-11-28 DIAGNOSIS — M25649 Stiffness of unspecified hand, not elsewhere classified: Secondary | ICD-10-CM | POA: Insufficient documentation

## 2023-11-28 DIAGNOSIS — R600 Localized edema: Secondary | ICD-10-CM | POA: Insufficient documentation

## 2023-11-28 DIAGNOSIS — M79642 Pain in left hand: Secondary | ICD-10-CM

## 2023-11-28 DIAGNOSIS — S62522A Displaced fracture of distal phalanx of left thumb, initial encounter for closed fracture: Secondary | ICD-10-CM | POA: Insufficient documentation

## 2023-11-28 DIAGNOSIS — L905 Scar conditions and fibrosis of skin: Secondary | ICD-10-CM | POA: Diagnosis present

## 2023-11-28 DIAGNOSIS — R278 Other lack of coordination: Secondary | ICD-10-CM | POA: Insufficient documentation

## 2023-11-28 DIAGNOSIS — R208 Other disturbances of skin sensation: Secondary | ICD-10-CM | POA: Diagnosis present

## 2023-11-28 DIAGNOSIS — M6281 Muscle weakness (generalized): Secondary | ICD-10-CM | POA: Insufficient documentation

## 2023-11-28 DIAGNOSIS — R29898 Other symptoms and signs involving the musculoskeletal system: Secondary | ICD-10-CM | POA: Diagnosis present

## 2023-11-28 NOTE — Therapy (Signed)
 OUTPATIENT OCCUPATIONAL THERAPY ORTHO EVALUATION  Patient Name: Misty [name] MRN: [id number] DOB:September 19, 1960, 63 y.o., female Today's Date: 11/28/2023  PCP: Geofm Glade PARAS, MD REFERRING PROVIDER: Arlinda Buster, MD  END OF SESSION:  OT End of Session - 11/28/23 0925     Visit Number 1    Number of Visits 16    Date for OT Re-Evaluation 02/07/24    Authorization Type [employer] Dual 2025 Covered 100 %    Authorization Time Period NO AUTH REQUIRED VL: [place]    Progress Note Due on Visit 10    OT Start Time 0930    OT Stop Time 1100    OT Time Calculation (min) 90 min    Equipment Utilized During Treatment Thermoplastic Splinting material    Activity Tolerance Patient tolerated treatment well    Behavior During Therapy WFL for tasks assessed/performed          Past Medical History:  Diagnosis Date   Allergy    Arthritis    Back pain    Colon cancer (HCC)    Depression    Diarrhea    GERD (gastroesophageal reflux disease)    Hypertension    Insomnia    MS (multiple sclerosis) (HCC) 2000   pt says that she doesn't have it   Other hammer toe (acquired) 03/31/2013   Pneumonia    hx of   Pre-diabetes    Umbilical hernia    Past Surgical History:  Procedure Laterality Date   ABDOMINAL HYSTERECTOMY     ANTERIOR CERVICAL DECOMP/DISCECTOMY FUSION N/A 03/01/2014   Procedure: ANTERIOR CERVICAL DECOMPRESSION/DISCECTOMY FUSION 1 LEVEL;  Surgeon: Victory DELENA Gunnels, MD;  Location: [facility];  Service: Neurosurgery;  Laterality: N/A;  ANTERIOR CERVICAL DECOMPRESSION/DISCECTOMY FUSION 1 LEVEL   BUNIONECTOMY Right 10/14/2014   @[facility]    CESAREAN SECTION     COLON SURGERY     COLONOSCOPY     FOOT SURGERY Right    GASTRIC BYPASS     Hammer Toe Repair Right 10/14/2014   RT #2, @[facility]    INCISION AND DRAINAGE HIP Left 06/02/2020   Procedure: IRRIGATION AND DEBRIDEMENT LEFT HIP, POSSIBLE HEAD BALL AND LINER EXCHANGE;  Surgeon: Fidel Rogue, MD;  Location: [facility];  Service: Orthopedics;   Laterality: Left;    KNEE ARTHROPLASTY Right 06/14/2022   Procedure: COMPUTER ASSISTED TOTAL KNEE ARTHROPLASTY;  Surgeon: Fidel Rogue, MD;  Location: [facility];  Service: Orthopedics;  Laterality: Right;  160   TENOTOMY Right 10/14/2014   RT #3, @[facility]    TOTAL HIP ARTHROPLASTY Left 04/28/2020   Procedure: TOTAL HIP ARTHROPLASTY ANTERIOR APPROACH;  Surgeon: Fidel Rogue, MD;  Location: [facility];  Service: Orthopedics;  Laterality: Left;  3E bed   TOTAL HIP ARTHROPLASTY Right 01/25/2021   Procedure: TOTAL HIP ARTHROPLASTY ANTERIOR APPROACH;  Surgeon: Fidel Rogue, MD;  Location: [facility];  Service: Orthopedics;  Laterality: Right;   Patient Active Problem List   Diagnosis Date Noted   Obesity (BMI 30.0-34.9) 10/17/2023   S/P TKR (total knee replacement), right  04/2022 10/17/2023   GERD (gastroesophageal reflux disease) 04/19/2023   Rib pain on right side 02/06/2023   Iron  deficiency anemia 10/16/2021   CKD (chronic kidney disease) stage 3, GFR 30-59 ml/min (HCC) 10/15/2021   RUQ pain 10/12/2021   S/P hip replacement, bilateral 05/30/2021   Pain due to onychomycosis of toenail of right foot 04/25/2021   Osteoarthritis of right hip 01/25/2021   Avascular necrosis of femoral head (HCC) 01/25/2021   Toxic neuropathy (  HCC) 07/08/2020   Osteoarthritis of left hip 02/19/2020   COVID-19 virus infection 04/18/2019   Restless leg syndrome 03/10/2019   Hyperlipidemia 11/27/2018   Anemia 11/25/2018   Nausea 09/17/2018   Right shoulder pain 03/03/2018   Pancreatitis 02/22/2018   Alcohol  abuse 02/22/2018   Cholelithiasis 02/22/2018   Adhesive capsulitis of right shoulder 01/20/2018   Ventral hernia 09/16/2017   Prediabetes 04/16/2017   Spinal stenosis of lumbar region 11/29/2015   Positive urine drug screen 11/16/2015   Umbilical hernia 10/12/2015   Sleeping difficulties 07/15/2015   Bunion, right 06/20/2015   B12 deficiency 03/30/2015   Hammer toe of left foot 02/16/2015   Fibrosis of  skin of lower extremity 02/16/2015   Cervical spondylosis with radiculopathy 03/01/2014   Hammer toe of right foot 01/26/2014   Benign intracranial hypertension 06/18/2013   Porokeratosis 03/31/2013   Vitamin D  deficiency 04/18/2010   MEDIAL MENISCUS TEAR, LEFT 06/30/2008   Current tear knee, medial meniscus 06/30/2008   BARRETTS ESOPHAGUS 06/28/2008   Osteoarthritis of right knee 06/28/2008   HYPERTENSION, BENIGN ESSENTIAL 05/24/2008   Irritable bowel syndrome 05/24/2008   BARIATRIC SURGERY STATUS 03/19/2006   History of malignant neoplasm of large intestine 03/19/1997    ONSET DATE: Referral: 10/29/2023  Surgery 10/31/23; Fall 10/19/33  REFERRING DIAG:   D37.477J (ICD-10-CM) - Closed displaced fracture of distal phalanx of left thumb, initial encounter Needs OT to start 4 weeks s/p left thumb closed reduction with perc pinning on 10/31/23  THERAPY DIAG:  Muscle weakness (generalized)  Other lack of coordination  Stiffness of thumb joint  Other symptoms and signs involving the musculoskeletal system  Other disturbances of skin sensation  Rationale for Evaluation and Treatment: Rehabilitation  SUBJECTIVE:   SUBJECTIVE STATEMENT: Pt reports she was in the garage at her friend's place and was putting up a pan on a shelf when she lost her balance and fell down 4 stairs onto the cement. Pt did report she was previously dx with Multiple Sclerosis and took medication for about 10 years but then was taken off them - which is when  she reports she started having falls. She saw MD this morning and had pins removed.  She does not driving herself to the grocery store the other day and having to use her R hand to reach the door handle but slipped off her vehicle seat and had to lower herself to the ground.  She reports getting some help with tighter clothing options ie) spanx as she likes this for her back pain and for her Sunday clothing choices.  PER MD appt day of OT evaluation: X-rays  obtained today show stable appearance of the pin fixation of the thumb metacarpal fracture and distal phalanx fracture.  Pins removed today without incident.  She will be seen by occupational therapy for fabrication of an orthosis and begin gentle range of motion protocol.  Will wait until week 8 to begin strengthening.  Follow-up with myself in 1 month   Pt accompanied by: self and Carlin (friend dropped her off)  PERTINENT HISTORY:  Patient is a 63 year old female with a history of MS (pt reports she took meds ~ 10 years but then stopped taking it and has had falls etc since then), hypertension, degenerative disc disease on chronic pain medication with Xtampza  and oxycodone  10 mg daily who presented to the hospital 10/21/23 with pain on the left side of her body after a fall the night before ~9 PM.   Xray results: IMPRESSION:  1. Acute, mildly displaced intra-articular fracture involving the base of the 1st metacarpal with associated mild radial subluxation at the 1st carpometacarpal joint. 2. Mildly displaced intra-articular fracture of the base of the 1st distal phalanx.  PMHx: HTN, IBS, GERD, neuropathy, OA with B hip and R knee replacements, prediabetic, restless leg syndrome, L ring finger injury x 1 year  PRECAUTIONS: Fall risk. MD requesting thumb spica with IP immobilized and tip protection recommendation per [place]  Hand Protocol for post op distal phalanx fracture pg 274; see also post op MCP repair pp [phone] and  RED FLAGS: None   WEIGHT BEARING RESTRICTIONS: Yes L hand  PAIN: Back is more uncomfortable - that bothers her the most ie) not able to stand up straight etc Are you having pain? Yes: NPRS scale: 4-5 Pain location: L thumb Pain description: throbbing Aggravating factors: moving it Relieving factors: not moving it and pain meds  FALLS: Has patient fallen in last 6 months? Yes. Number of falls 3 - - 1 with injury; and recently fell out of the truck trying to close  the door with opposite hand but was able to ease herself to the ground - Reports she had gone to grocery store but wasn't supposed to drive.  LIVING ENVIRONMENT: Lives with: lives alone and lives with an adult companion - Pt lives/stays between 2 locations ie) her home and her friends home but his place has stairs, daughters visits regularly - oldest daughter is an Charity fundraiser Lives in: House/apartment - was moving between 2 homes (stairs at friends place) Stairs: 5 steps in garage at friends place - this was where she fell Has following equipment at home: Single point cane, Environmental consultant - 4 wheeled, shower chair, Shower bench, bed side commode, Grab bars, and uses shower chair, uses cane - mostly with longer distances  PLOF: Independent, driving, had foot surgery and was in therapy for this when she fell.  PATIENT GOALS: Wants to use her hand better - put her bra and spanx on herself  NEXT MD VISIT: 12/30/23  OBJECTIVE:  Note: Objective measures were completed at Evaluation unless otherwise noted.  HAND DOMINANCE: Right  ADLs: Overall ADLs: Gets help with tight fitting clothes Transfers/ambulation related to ADLs: Mod Ind Eating: gets help cutting food - depends on what it is Grooming: Help with hair Upper body dressing: Help with bra/spanx Lower body dressing: Pt pick socks and shoes she can manage herself Toileting: Ind with some difficulty reaching at times Bathing: Pt's daughter has been helping her get a good shower Tub shower transfers: Mod Ind with shower chair Equipment: Shower seat with back, Grab bars, and bed side commode  FUNCTIONAL OUTCOME MEASURES: Quick Dash: TBD ~ Educated determination by OTR > 60%   UPPER EXTREMITY ROM:     Active ROM Right eval Left eval  Shoulder flexion [facility]  Shoulder abduction    Shoulder adduction    Shoulder extension    Shoulder internal rotation    Shoulder external rotation    Elbow flexion [facility] WFL  Elbow extension    Wrist flexion 60 40   Wrist extension 70 40  Wrist ulnar deviation  Limited  Wrist radial deviation  Limited  Wrist pronation  WFL  Wrist supination  Limited   (Blank rows = not tested)  Active ROM Right eval Left eval  Thumb MCP (0-60)  55  Thumb IP (0-80)  0  Thumb Radial abd/add (0-55)     Thumb Palmar abd/add (0-45)  Thumb Opposition to Small Finger     Index MCP (0-90)   55  Index PIP (0-100)   65  Index DIP (0-70)    37  Long MCP (0-90)   55   Long PIP (0-100)   70   Long DIP (0-70)   37   Ring MCP (0-90)   55   Ring PIP (0-100)   78   Ring DIP (0-70)    37  Little MCP (0-90)    55  Little PIP (0-100)    78  Little DIP (0-70)   37   (Blank rows = not tested)   UPPER EXTREMITY MMT:     MMT Right eval Left eval  Shoulder flexion 4- 4-  Shoulder abduction    Shoulder adduction    Shoulder extension    Shoulder internal rotation    Shoulder external rotation    Middle trapezius    Lower trapezius    Elbow flexion 4 4-  Elbow extension 4 4-  Wrist flexion 4 3-  Wrist extension 4 3-  Wrist ulnar deviation    Wrist radial deviation    Wrist pronation 4 4-  Wrist supination 4 2  (Blank rows = not tested)  HAND FUNCTION: Grip strength: Right: TBA lbs; Left: TBA lbs  COORDINATION: 9 Hole Peg test: Right: TBA sec; Left: TBA sec  SENSATION: Not tested  EDEMA: L hand/digits swollen s/p removal of cast today and pins  COGNITION: Overall cognitive status: Within functional limits for tasks assessed  OBSERVATIONS: Pt ambulates with no AE and no obvious loss of balance but with abnormal gait pattern s/p B hip and R knee replacements. The pt appears well kept and was concerned about the dry flaking skin around her hand/digits with education to gentle exfoliate skin but not to apply lotion yet as pin sites are not yet healed s/p removal of pins today.   TODAY'S TREATMENT:                                                                                                                              - Self Care education and training completed for duration as noted below including: OT educated pt on rehabilitation process and results of objective measures in relation to pt specific goals.  Patient educated re: removal of the protective orthosis for very light ADLs ie) hand washing and HEP  - Therapeutic exercises completed for duration as noted below including:  Pt issued tendon gliding exercises/handout with review of motions to isolate DIP, PIP and MCP joints for straight finger position, hook (DIP/PIP flexion), fist (DIP/PIP/MCP flexion), taco/duck (MCP flexion only) and flat fist (MCP and PIP flexion). Education provided on purpose of tendon glide exercises ie) to increase the circulation to the hand and wrist, reduce swelling and promote healthier soft tissue for increased AROM - particularly of digits s/p removal of wrist cast today at MD appt, and not to build hand or  wrist strength at this time.   Pt also encouraged to complete gentle thumb ROM outside of splint on her own power without stretching or bending joints with assistance of opposite hand yet.  - Orthotic management Pt was fitted with a custom fabricated thumb spica splint placing her left thumb in slight ABD and rotation, or the functional position with IP joint stabilized and distal phalanx covered to avoid bumping it s/p pin removal for distal phalanx and metacarpal fracture with surgery on 10/31/23. Pt was educated to wear the splint at all times for protection, she was educated in splinting use, care and precautions. She verbalized understanding in the clinic today.    PATIENT EDUCATION: Education details: OT role and POC Considerations & tendon glide exercises Person educated: Patient Education method: Explanation, Demonstration, Tactile cues, Verbal cues, and Handouts Education comprehension: verbalized understanding, returned demonstration, verbal cues required, tactile cues required, and needs further  education  HOME EXERCISE PROGRAM: 11/28/23: Tendon Gliding Exercises  GOALS: Goals reviewed with patient? Yes  SHORT TERM GOALS: Target date: 12/20/23  Pt will obtain protective, custom forearm based thumb spica splint/orthotic. Baseline: New to outpt OT Goal status: MET   2.  Pt will demo/state understanding of initial HEP to improve pain levels and prerequisite motion for functional use of left hand/thumb. Baseline: New to outpt OT. Tendon gliding ROM provided at eval. Goal status: IN Progress     LONG TERM GOALS: Target date: 02/07/24   Patient will demonstrate at least 16% improvement with quick Dash score (reporting <50% disability or less) indicating improved functional use of affected extremity.  Baseline: New to outpt OT ~ >63% Goal status: INITIAL   2.  Pt will improve grip strength in left hand from unable to assess to at least 15 lbs for functional use at home and in IADLs. Baseline: New to outpt OT ~ unable to assess Goal status: INITIAL   3.  Pt will improve A/ROM in left thumb from opposition to tip of index finger on left to at least opposition to PIP (or greater) of left small finger, to have functional motion for tasks like ADLs, reach, and grasp.  Baseline: New to outpt OT ~ No active IP flexion Goal status: INITIAL   4.  Pt will improve left hand functional use from unable to assess to at least Mod I for basic ADLs (ie, brushing her hair, bathing, brushing her teeth and folding laundry) to assist in ability to carry out self-care and higher-level homecare tasks with less difficulty. Baseline: New to outpt OT with family assistance with bra/spanx Goal status: INITIAL   5.  Pt will improve coordination skills in left hand, as seen by better score on 9 hole peg testing to have increased functional ability to carry out fine motor tasks (fasteners, etc.) and more complex, coordinated IADLs (meal prep, sports, etc.).  Baseline: New to outpt OT - unable to complete due  to poor IP flexion Goal status: INITIAL/TBD   6.  Pt will decrease pain at worst from 5/10 to 2/10 or less to have better sleep and occupational participation in daily roles. Baseline: New to outpt OT Goal status: INITIAL  ASSESSMENT:  CLINICAL IMPRESSION:   Patient is a 63 yo female who was seen today for occupational therapy evaluation for L thumb metacarpal and distal phalanx fractures with pins removed today. Hx includes HTN, IBS, GERD, neuropathy, OA with B hip and R knee replacements, prediabetic, restless leg syndrome, L ring finger injury x 1 year.  Patient currently presents below baseline level of function demonstrating functional deficits and impairments as noted below. Pt would benefit from skilled OT services in the outpatient setting to work on impairments as noted below to help pt return to PLOF as able.   Currently, the pt is 4 weeks and 0 day post-op. Pt was fitted with a left custom fabricated forearm based thumb spica splint placing thumb in slight ABD and rotation, functional position following a pinning 10/31/23. Pt was educated to wear the splint at all times for protection as well as splinting use, care, and precautions. Educated in HEP as per [place]  Hand Protocol following phalanx fractures. Verbal/written handout instructions provided and patient verbalized understanding in the clinic today.  Pt will benefit from outpatient OT to address deficits in A/ROM, decreased functional use of left hand/thumb, pain, pt education, scar management/desensitization, splinting, edema control, and overall functional use and strengthening of the affected hand.   PERFORMANCE DEFICITS: in functional skills including ADLs, IADLs, coordination, dexterity, proprioception, sensation, edema, tone, ROM, strength, pain, fascial restrictions, muscle spasms, flexibility, Fine motor control, Gross motor control, mobility, balance, body mechanics, endurance, decreased knowledge of precautions, decreased  knowledge of use of DME, wound, skin integrity, and UE functional use, cognitive skills including memory, problem solving, and safety awareness, and psychosocial skills including coping strategies, environmental adaptation, and routines and behaviors.   IMPAIRMENTS: are limiting patient from ADLs, IADLs, rest and sleep, leisure, and social participation.   COMORBIDITIES: has co-morbidities such as OA that affects occupational performance. Patient will benefit from skilled OT to address above impairments and improve overall function.  MODIFICATION OR ASSISTANCE TO COMPLETE EVALUATION: Min-Moderate modification of tasks or assist with assess necessary to complete an evaluation.  OT OCCUPATIONAL PROFILE AND HISTORY: Comprehensive assessment: Review of records and extensive additional review of physical, cognitive, psychosocial history related to current functional performance.  CLINICAL DECISION MAKING: High - multiple treatment options, significant modification of task necessary  REHAB POTENTIAL: Good  EVALUATION COMPLEXITY: High    PLAN:  OT FREQUENCY: 1-2x/week   OT DURATION: 10 weeks   PLANNED INTERVENTIONS: 97535 self care/ADL training, 02889 therapeutic exercise, 97530 therapeutic activity, 97140 manual therapy, 97035 ultrasound, 97018 paraffin, 02960 fluidotherapy, 97010 moist heat, 97010 cryotherapy, 97763 Orthotics management and training, 02239 Splinting (initial encounter), scar mobilization, passive range of motion, patient/family education, and DME and/or AE instructions  PLAN FOR NEXT SESSION: Splint check and adjustments PRN, review and performance of HEP as per [place]  Protocol for left thumb fractures.  Edema control, scar management PRN. Fabricate hand based thumb spica splint at 4-6 weeks post-op (if approved by Dr Erwin) and begin strengthening ~8 week mark postoperatively when cleared by Dr Erwin.   Per [place]  Hand Protocol for post op MCP pp [phone] and distal phalanx  fracture pg 274  Clarita LITTIE Pride, OT 11/28/2023, 5:24 PM

## 2023-11-28 NOTE — Patient Instructions (Addendum)
     Splint instructions: WEARING SCHEDULE:  Wear splint at ALL times except for hygiene. Remove your splint for your exercises, keep your forearm supported on a table or on pillows on your lap as discussed. When you are finished with your exercises, put the splint back on and do not use your hand for any functional activity (do not lift, push, pull or pick items up with your left hand at this time).    PURPOSE:  To prevent movement and for protection to your left thumb until injury can heal   CARE OF SPLINT:  Keep splint away from heat sources including: stove, radiator or furnace, or a car in sunlight. The splint can melt and will no longer fit you properly   Keep away from pets and children   Clean the splint with rubbing alcohol  as needed.  * During this time, make sure you also clean your hand/arm as instructed by your therapist and/or perform dressing changes as needed. Then dry hand/arm completely before replacing splint. (When cleaning hand/arm, keep it immobilized in same position until splint is replaced). Keep your hand elevated to help with swelling.   PRECAUTIONS/POTENTIAL PROBLEMS: *If you notice or experience increased pain, swelling, numbness, or a lingering reddened area from the splint: Contact your therapist immediately by calling [phone]. You must wear the splint for protection, but we will get you scheduled for adjustments as quickly as possible.  (If only straps or hooks need to be replaced and NO adjustments to the splint need to be made, just call the office ahead and let them know you are coming in)   If you have any medical concerns or signs of infection, please call your doctor immediately.

## 2023-11-28 NOTE — Progress Notes (Signed)
   KELLIS TOPETE - 63 y.o. female MRN [id number]  Date of birth: May 05, 1960  Office Visit Note: Visit Date: 11/28/2023 PCP: Geofm Glade PARAS, MD Referred by: Geofm Glade PARAS, MD  Subjective:  HPI: Misty [name] is a 63 y.o. female who presents today for follow up 4 weeks status post left thumb metacarpal fracture and distal phalanx fracture closed reduction with percutaneous pinning.  She is doing well overall, pain is controlled.  Been compliant with casting.  States that she did have a mechanical fall and needed to catch herself with the left hand with some associated swelling recently  Pertinent ROS were reviewed with the patient and found to be negative unless otherwise specified above in HPI.   Assessment & Plan: Visit Diagnoses:  1. Closed displaced fracture of distal phalanx of left thumb, initial encounter   2. Pain in left hand     Plan: X-rays obtained today show stable appearance of the pin fixation of the thumb metacarpal fracture and distal phalanx fracture.  Pins removed today without incident.  She will be seen by occupational therapy for fabrication of an orthosis and begin gentle range of motion protocol.  Will wait until week 8 to begin strengthening.  Follow-up with myself in 1 month  Follow-up: No follow-ups on file.   Meds & Orders: No orders of the defined types were placed in this encounter.   Orders Placed This Encounter  Procedures   XR Finger Thumb Left   XR Hand Complete Left     Procedures: No procedures performed       Objective:   Vital Signs: There were no vitals taken for this visit.  Ortho Exam Left thumb: - Pin sites clean dry and intact, thumb in appropriate alignment, sensation intact distally, normal color and capillary refill - Gentle range of motion at the MCP and IP of the thumb without significant pain or crepitus  Imaging: XR Finger Thumb Left Result Date: 11/28/2023 X-ray of left thumb confirms appropriate positioning of the  pin fixation of the metacarpal fracture and distal phalanx fractures  XR Hand Complete Left Result Date: 11/28/2023 X-ray of the left hand and thumb demonstrates appropriate and well healing of the thumb metacarpal fracture and distal phalanx fracture, pins remain well located in all planes    Mitsuye Schrodt Estela) Tyrelle Raczka, M.D. [facility], Hand Surgery

## 2023-12-05 ENCOUNTER — Ambulatory Visit: Payer: Self-pay | Admitting: Occupational Therapy

## 2023-12-05 DIAGNOSIS — M6281 Muscle weakness (generalized): Secondary | ICD-10-CM | POA: Diagnosis not present

## 2023-12-05 DIAGNOSIS — R29898 Other symptoms and signs involving the musculoskeletal system: Secondary | ICD-10-CM

## 2023-12-05 DIAGNOSIS — R208 Other disturbances of skin sensation: Secondary | ICD-10-CM

## 2023-12-05 DIAGNOSIS — M25649 Stiffness of unspecified hand, not elsewhere classified: Secondary | ICD-10-CM

## 2023-12-05 NOTE — Therapy (Addendum)
 OUTPATIENT OCCUPATIONAL THERAPY ORTHO TREATMENT  Patient Name: Misty [name] MRN: [id number] DOB:March 19, 1961, 63 y.o., female Today's Date: 12/05/2023  PCP: Geofm Glade PARAS, MD REFERRING PROVIDER: Arlinda Buster, MD  END OF SESSION:  OT End of Session - 12/05/23 1532     Visit Number 2    Number of Visits 16    Date for Recertification  02/07/24    Authorization Type [employer] Dual 2025 Covered 100 %    Authorization Time Period NO AUTH REQUIRED VL: [place]    Progress Note Due on Visit 10    OT Start Time 1534    OT Stop Time 1638    OT Time Calculation (min) 64 min    Equipment Utilized During Treatment Fluido    Activity Tolerance Patient tolerated treatment well    Behavior During Therapy WFL for tasks assessed/performed          Past Medical History:  Diagnosis Date   Allergy    Arthritis    Back pain    Colon cancer (HCC)    Depression    Diarrhea    GERD (gastroesophageal reflux disease)    Hypertension    Insomnia    MS (multiple sclerosis) (HCC) 2000   pt says that she doesn't have it   Other hammer toe (acquired) 03/31/2013   Pneumonia    hx of   Pre-diabetes    Umbilical hernia    Past Surgical History:  Procedure Laterality Date   ABDOMINAL HYSTERECTOMY     ANTERIOR CERVICAL DECOMP/DISCECTOMY FUSION N/A 03/01/2014   Procedure: ANTERIOR CERVICAL DECOMPRESSION/DISCECTOMY FUSION 1 LEVEL;  Surgeon: Victory DELENA Gunnels, MD;  Location: [facility];  Service: Neurosurgery;  Laterality: N/A;  ANTERIOR CERVICAL DECOMPRESSION/DISCECTOMY FUSION 1 LEVEL   BUNIONECTOMY Right 10/14/2014   @[facility]    CESAREAN SECTION     COLON SURGERY     COLONOSCOPY     FOOT SURGERY Right    GASTRIC BYPASS     Hammer Toe Repair Right 10/14/2014   RT #2, @[facility]    INCISION AND DRAINAGE HIP Left 06/02/2020   Procedure: IRRIGATION AND DEBRIDEMENT LEFT HIP, POSSIBLE HEAD BALL AND LINER EXCHANGE;  Surgeon: Fidel Rogue, MD;  Location: [facility];  Service: Orthopedics;  Laterality: Left;    KNEE  ARTHROPLASTY Right 06/14/2022   Procedure: COMPUTER ASSISTED TOTAL KNEE ARTHROPLASTY;  Surgeon: Fidel Rogue, MD;  Location: [facility];  Service: Orthopedics;  Laterality: Right;  160   TENOTOMY Right 10/14/2014   RT #3, @[facility]    TOTAL HIP ARTHROPLASTY Left 04/28/2020   Procedure: TOTAL HIP ARTHROPLASTY ANTERIOR APPROACH;  Surgeon: Fidel Rogue, MD;  Location: [facility];  Service: Orthopedics;  Laterality: Left;  3E bed   TOTAL HIP ARTHROPLASTY Right 01/25/2021   Procedure: TOTAL HIP ARTHROPLASTY ANTERIOR APPROACH;  Surgeon: Fidel Rogue, MD;  Location: [facility];  Service: Orthopedics;  Laterality: Right;   Patient Active Problem List   Diagnosis Date Noted   Obesity (BMI 30.0-34.9) 10/17/2023   S/P TKR (total knee replacement), right  04/2022 10/17/2023   GERD (gastroesophageal reflux disease) 04/19/2023   Rib pain on right side 02/06/2023   Iron  deficiency anemia 10/16/2021   CKD (chronic kidney disease) stage 3, GFR 30-59 ml/min (HCC) 10/15/2021   RUQ pain 10/12/2021   S/P hip replacement, bilateral 05/30/2021   Pain due to onychomycosis of toenail of right foot 04/25/2021   Osteoarthritis of right hip 01/25/2021   Avascular necrosis of femoral head (HCC) 01/25/2021   Toxic neuropathy (HCC) 07/08/2020  Osteoarthritis of left hip 02/19/2020   COVID-19 virus infection 04/18/2019   Restless leg syndrome 03/10/2019   Hyperlipidemia 11/27/2018   Anemia 11/25/2018   Nausea 09/17/2018   Right shoulder pain 03/03/2018   Pancreatitis 02/22/2018   Alcohol  abuse 02/22/2018   Cholelithiasis 02/22/2018   Adhesive capsulitis of right shoulder 01/20/2018   Ventral hernia 09/16/2017   Prediabetes 04/16/2017   Spinal stenosis of lumbar region 11/29/2015   Positive urine drug screen 11/16/2015   Umbilical hernia 10/12/2015   Sleeping difficulties 07/15/2015   Bunion, right 06/20/2015   B12 deficiency 03/30/2015   Hammer toe of left foot 02/16/2015   Fibrosis of skin of lower extremity  02/16/2015   Cervical spondylosis with radiculopathy 03/01/2014   Hammer toe of right foot 01/26/2014   Benign intracranial hypertension 06/18/2013   Porokeratosis 03/31/2013   Vitamin D  deficiency 04/18/2010   MEDIAL MENISCUS TEAR, LEFT 06/30/2008   Current tear knee, medial meniscus 06/30/2008   BARRETTS ESOPHAGUS 06/28/2008   Osteoarthritis of right knee 06/28/2008   HYPERTENSION, BENIGN ESSENTIAL 05/24/2008   Irritable bowel syndrome 05/24/2008   BARIATRIC SURGERY STATUS 03/19/2006   History of malignant neoplasm of large intestine 03/19/1997    ONSET DATE: Referral: 10/29/2023  Surgery 10/31/23; Fall 10/19/33  REFERRING DIAG:   D37.477J (ICD-10-CM) - Closed displaced fracture of distal phalanx of left thumb, initial encounter Needs OT to start 4 weeks s/p left thumb closed reduction with perc pinning on 10/31/23  THERAPY DIAG:  Muscle weakness (generalized)  Other symptoms and signs involving the nervous system  Other disturbances of skin sensation  Stiffness of thumb joint  Rationale for Evaluation and Treatment: Rehabilitation  SUBJECTIVE:   SUBJECTIVE STATEMENT: Patient reported that she is currently taking 300 mg of gabapentin . She inquired about returning to pool activities. OT advised that resuming pool activities is okay. However, she must follow  precautions for her left hand. She should leave her splint on and avoid submerging her left hand in the water  to prevent resistance and pressure that could exacerbate her condition.  Pt accompanied by: Self   PERTINENT HISTORY:  Patient is a 63 year old female with a history of MS (pt reports she took meds ~ 10 years but then stopped taking it and has had falls etc since then), hypertension, degenerative disc disease on chronic pain medication with Xtampza  and oxycodone  10 mg daily who presented to the hospital 10/21/23 with pain on the left side of her body after a fall the night before ~9 PM.   Xray  results: IMPRESSION: 1. Acute, mildly displaced intra-articular fracture involving the base of the 1st metacarpal with associated mild radial subluxation at the 1st carpometacarpal joint. 2. Mildly displaced intra-articular fracture of the base of the 1st distal phalanx.  PMHx: HTN, IBS, GERD, neuropathy, OA with B hip and R knee replacements, prediabetic, restless leg syndrome, L ring finger injury x 1 year  PRECAUTIONS: Fall risk. MD requesting thumb spica with IP immobilized and tip protection recommendation per [place]  Hand Protocol for post op distal phalanx fracture pg 274; see also post op MCP repair pp [phone] and  RED FLAGS: None   WEIGHT BEARING RESTRICTIONS: Yes L hand  PAIN:  Yes: NPRS scale: 4 Pain location: L thumb Pain description: throbbing, stinging Aggravating factors: moving it Relieving factors:  pain meds  FALLS: Has patient fallen in last 6 months? Yes. Number of falls 3 - - 1 with injury; and recently fell out of the truck trying to close the door  with opposite hand but was able to ease herself to the ground - Reports she had gone to grocery store but wasn't supposed to drive.  LIVING ENVIRONMENT: Lives with: lives alone and lives with an adult companion - Pt lives/stays between 2 locations ie) her home and her friends home but his place has stairs, daughters visits regularly - oldest daughter is an Charity fundraiser Lives in: House/apartment - was moving between 2 homes (stairs at friends place) Stairs: 5 steps in garage at friends place - this was where she fell Has following equipment at home: Single point cane, Environmental consultant - 4 wheeled, shower chair, Shower bench, bed side commode, Grab bars, and uses shower chair, uses cane - mostly with longer distances  PLOF: Independent, driving, had foot surgery and was in therapy for this when she fell.  PATIENT GOALS: Wants to use her hand better - put her bra and spanx on herself  NEXT MD VISIT: 12/30/23  OBJECTIVE:  Note:  Objective measures were completed at Evaluation unless otherwise noted.  HAND DOMINANCE: Right  ADLs: Overall ADLs: Gets help with tight fitting clothes Transfers/ambulation related to ADLs: Mod Ind Eating: gets help cutting food - depends on what it is Grooming: Help with hair Upper body dressing: Help with bra/spanx Lower body dressing: Pt pick socks and shoes she can manage herself Toileting: Ind with some difficulty reaching at times Bathing: Pt's daughter has been helping her get a good shower Tub shower transfers: Mod Ind with shower chair Equipment: Shower seat with back, Grab bars, and bed side commode  FUNCTIONAL OUTCOME MEASURES: Quick Dash: TBD ~ Educated determination by OTR > 60%   UPPER EXTREMITY ROM:     Active ROM Right eval Left eval  Shoulder flexion [facility]  Shoulder abduction    Shoulder adduction    Shoulder extension    Shoulder internal rotation    Shoulder external rotation    Elbow flexion [facility] WFL  Elbow extension    Wrist flexion 60 40  Wrist extension 70 40  Wrist ulnar deviation  Limited  Wrist radial deviation  Limited  Wrist pronation  WFL  Wrist supination  Limited   (Blank rows = not tested)  Active ROM Right eval Left eval  Thumb MCP (0-60)  55  Thumb IP (0-80)  0  Thumb Radial abd/add (0-55)     Thumb Palmar abd/add (0-45)     Thumb Opposition to Small Finger     Index MCP (0-90)   55  Index PIP (0-100)   65  Index DIP (0-70)    37  Long MCP (0-90)   55   Long PIP (0-100)   70   Long DIP (0-70)   37   Ring MCP (0-90)   55   Ring PIP (0-100)   78   Ring DIP (0-70)    37  Little MCP (0-90)    55  Little PIP (0-100)    78  Little DIP (0-70)   37   (Blank rows = not tested)   UPPER EXTREMITY MMT:     MMT Right eval Left eval  Shoulder flexion 4- 4-  Shoulder abduction    Shoulder adduction    Shoulder extension    Shoulder internal rotation    Shoulder external rotation    Middle trapezius    Lower trapezius     Elbow flexion 4 4-  Elbow extension 4 4-  Wrist flexion 4 3-  Wrist extension 4 3-  Wrist  ulnar deviation    Wrist radial deviation    Wrist pronation 4 4-  Wrist supination 4 2  (Blank rows = not tested)  HAND FUNCTION: Grip strength: Right: TBA lbs; Left: TBA lbs  COORDINATION: 9 Hole Peg test: Right: TBA sec; Left: TBA sec  SENSATION: Not tested  EDEMA: L hand/digits swollen s/p removal of cast today and pins  COGNITION: Overall cognitive status: Within functional limits for tasks assessed  OBSERVATIONS: Pt ambulates with no AE and no obvious loss of balance but with abnormal gait pattern s/p B hip and R knee replacements. The pt appears well kept and was concerned about the dry flaking skin around her hand/digits with education to gentle exfoliate skin but not to apply lotion yet as pin sites are not yet healed s/p removal of pins today.   TODAY'S TREATMENT:                                                                                                                            - Self Care education and training completed for duration as noted below including: OT reviewed that it is okay for the patient to remove the protective orthosis briefly for very light activities of daily living such as hand washing and her home exercise program HEP. Pt was also educated on proper orthosis maintenance, including cleaning techniques. Additionally, she received guidance on using heat therapy specifically applying a heating pad to L hand to help manage joint pain, reduce stiffness, and decrease edema.   Pt educated on desensitization techniques. Emphasizing the purpose of  resensitization is to inhibit or interrupt the body's interpretation of routine stimuli as uncomfortable. It does not ensure that these stimuli will become pleasant or enjoyable or that your feeling will be completely normal, but that they will no longer provoke an extreme pain or uncomfortable response, that you are more  sensory aware and that you are safe despite any residual deficits.    One way to desensitize a painful incision or tingly area OR to resensitize a numb area is by rubbing it with different textures. This will make your limb more tolerant to touch and pressure. Before you begin, make sure your hands and the materials you're using are clean.  To rub your painful/sensitive/numb/tingly area with different textures: Sit in a comfortable position with the painful/sensitive/numb/tingly area uncovered. Start with a material that is soft, such as a cotton ball or silk.  Start with a light pressure and gradually increase the pressure. Vary the textures you use as you are able to tolerate them.  Progress to materials that are rougher.   Remember to apply regular stimulus to the affected area of the body for short durations, frequently throughout the day to increase sensory input to the brain ie)  use a combination of these methods for 10 to 15 minutes, 3-4 times per day.  Vary the objects you use, the difficulty of tasks  and try to incorporate sensory  stimulation during different activities throughout the day/week. With time, the body will acclimate to the sensation, leading to a decrease in the body's uncomfortable response or hypersensitivity to the stimulus.   - Therapeutic exercises completed for duration as noted below including:  Pt placed LUE in Fluidotherapy machine with supervised ROM x 10 min. Pt was educated to complete tendon glides during modality time to improve ROM and decrease pain/stiffness of affected extremity by use of the machine's massaging action and thermal properties.    Tendon gliding exercises included motions to isolate DIP, PIP and MCP joints for straight finger position, hook (DIP/PIP flexion), fist (DIP/PIP/MCP flexion), taco/duck (MCP flexion only) and flat fist (MCP and PIP flexion). Education provided on purpose of tendon glide exercises ie) to increase the circulation to the  hand and wrist, reduce swelling and promote healthier soft tissue for increased AROM - particularly of digits.   Pt also encouraged to complete gentle thumb ROM outside of splint. She has also been encouraged to use R hand to provide isolation of IP joint for better movement. Pt verbalized understanding.   Orthotic subsequent completed for duration as noted below including: Pt indicated discomfort along the ulnar side of the splint.OT was able to use the heat gun to modify and make adjustments as needed for better comfort and fit.    PATIENT EDUCATION: Education details: AROM/AAROM exercises, fluido, desensitization Person educated: Patient Education method: Explanation, Demonstration, Tactile cues, Verbal cues, and Handouts Education comprehension: verbalized understanding, returned demonstration, verbal cues required, tactile cues required, and needs further education  HOME EXERCISE PROGRAM: 11/28/23: Tendon Gliding Exercises 12/05/23: AROM/AAROM exercises  GOALS: Goals reviewed with patient? Yes  SHORT TERM GOALS: Target date: 12/20/23  Pt will obtain protective, custom forearm based thumb spica splint/orthotic. Baseline: New to outpt OT Goal status: MET   2.  Pt will demo/state understanding of initial HEP to improve pain levels and prerequisite motion for functional use of left hand/thumb. Baseline: New to outpt OT. Tendon gliding ROM provided at eval. Goal status: IN Progress     LONG TERM GOALS: Target date: 02/07/24   Patient will demonstrate at least 16% improvement with quick Dash score (reporting <50% disability or less) indicating improved functional use of affected extremity.  Baseline: New to outpt OT ~ >63% Goal status: IN PROGRESS   2.  Pt will improve grip strength in left hand from unable to assess to at least 15 lbs for functional use at home and in IADLs. Baseline: New to outpt OT ~ unable to assess Goal status: IN PROGRESS   3.  Pt will improve A/ROM in  left thumb from opposition to tip of index finger on left to at least opposition to PIP (or greater) of left small finger, to have functional motion for tasks like ADLs, reach, and grasp.  Baseline: New to outpt OT ~ No active IP flexion Goal status: IN PROGRESS   4.  Pt will improve left hand functional use from unable to assess to at least Mod I for basic ADLs (ie, brushing her hair, bathing, brushing her teeth and folding laundry) to assist in ability to carry out self-care and higher-level homecare tasks with less difficulty. Baseline: New to outpt OT with family assistance with bra/spanx Goal status: INITIAL   5.  Pt will improve coordination skills in left hand, as seen by better score on 9 hole peg testing to have increased functional ability to carry out fine motor tasks (fasteners, etc.) and more complex, coordinated IADLs (  meal prep, sports, etc.).  Baseline: New to outpt OT - unable to complete due to poor IP flexion Goal status: INITIAL/TBD   6.  Pt will decrease pain at worst from 5/10 to 2/10 or less to have better sleep and occupational participation in daily roles. Baseline: New to outpt OT Goal status: IN PROGRESS  ASSESSMENT:  CLINICAL IMPRESSION:  Patient presents with decreased functional use of the left thumb following a distal phalanx fracture. She demonstrates good rehab potential as evidence to verbalizing understanding of HEP and orthosis maintenance protocols. She will continue to benefit from skilled outpatient OT to improve functional use of L hand for ADLs and IADLs.   PERFORMANCE DEFICITS: in functional skills including ADLs, IADLs, coordination, dexterity, proprioception, sensation, edema, tone, ROM, strength, pain, fascial restrictions, muscle spasms, flexibility, Fine motor control, Gross motor control, mobility, balance, body mechanics, endurance, decreased knowledge of precautions, decreased knowledge of use of DME, wound, skin integrity, and UE functional use,  cognitive skills including memory, problem solving, and safety awareness, and psychosocial skills including coping strategies, environmental adaptation, and routines and behaviors.   IMPAIRMENTS: are limiting patient from ADLs, IADLs, rest and sleep, leisure, and social participation.   COMORBIDITIES: has co-morbidities such as OA that affects occupational performance. Patient will benefit from skilled OT to address above impairments and improve overall function.  PLAN:  OT FREQUENCY: 1-2x/week   OT DURATION: 10 weeks   PLANNED INTERVENTIONS: 97535 self care/ADL training, 02889 therapeutic exercise, 97530 therapeutic activity, 97140 manual therapy, 97035 ultrasound, 97018 paraffin, 02960 fluidotherapy, 97010 moist heat, 97010 cryotherapy, 97763 Orthotics management and training, 02239 Splinting (initial encounter), scar mobilization, passive range of motion, patient/family education, and DME and/or AE instructions  PLAN FOR NEXT SESSION:  Splint check and adjustments PRN Update HEP per [place]  Protocol for thumb fracture Edema control/scar management PRN Fabricate hand based thumb spica splint at 4-6 weeks post-op (if approved by Dr Erwin) and begin strengthening ~8 week mark postoperatively when cleared by Dr Erwin.   Per [place]  Hand Protocol for post op MCP pp [phone] and distal phalanx fracture pg 274  Shalondra Wunschel, Student-OT 12/05/2023, 5:06 PM

## 2023-12-10 ENCOUNTER — Ambulatory Visit: Payer: Self-pay | Admitting: Occupational Therapy

## 2023-12-10 DIAGNOSIS — L905 Scar conditions and fibrosis of skin: Secondary | ICD-10-CM

## 2023-12-10 DIAGNOSIS — R278 Other lack of coordination: Secondary | ICD-10-CM

## 2023-12-10 DIAGNOSIS — R600 Localized edema: Secondary | ICD-10-CM

## 2023-12-10 DIAGNOSIS — M6281 Muscle weakness (generalized): Secondary | ICD-10-CM | POA: Diagnosis not present

## 2023-12-10 DIAGNOSIS — M25649 Stiffness of unspecified hand, not elsewhere classified: Secondary | ICD-10-CM

## 2023-12-10 DIAGNOSIS — R29898 Other symptoms and signs involving the musculoskeletal system: Secondary | ICD-10-CM

## 2023-12-10 NOTE — Therapy (Signed)
 OUTPATIENT OCCUPATIONAL THERAPY ORTHO TREATMENT  Patient Name: Misty [name] MRN: [id number] DOB:1960-08-14, 63 y.o., female Today's Date: 12/10/2023  PCP: Geofm Glade PARAS, MD REFERRING PROVIDER: Arlinda Buster, MD  END OF SESSION:  OT End of Session - 12/10/23 1538     Visit Number 3    Number of Visits 16    Date for Recertification  02/07/24    Authorization Type [employer] Dual 2025 Covered 100 %    Authorization Time Period NO AUTH REQUIRED VL: [place]    Progress Note Due on Visit 10    OT Start Time 1538    OT Stop Time 1638    OT Time Calculation (min) 60 min    Equipment Utilized During Treatment Thermoplastic material    Activity Tolerance Patient tolerated treatment well    Behavior During Therapy WFL for tasks assessed/performed          Past Medical History:  Diagnosis Date   Allergy    Arthritis    Back pain    Colon cancer (HCC)    Depression    Diarrhea    GERD (gastroesophageal reflux disease)    Hypertension    Insomnia    MS (multiple sclerosis) 2000   pt says that she doesn't have it   Other hammer toe (acquired) 03/31/2013   Pneumonia    hx of   Pre-diabetes    Umbilical hernia    Past Surgical History:  Procedure Laterality Date   ABDOMINAL HYSTERECTOMY     ANTERIOR CERVICAL DECOMP/DISCECTOMY FUSION N/A 03/01/2014   Procedure: ANTERIOR CERVICAL DECOMPRESSION/DISCECTOMY FUSION 1 LEVEL;  Surgeon: Victory DELENA Gunnels, MD;  Location: [facility];  Service: Neurosurgery;  Laterality: N/A;  ANTERIOR CERVICAL DECOMPRESSION/DISCECTOMY FUSION 1 LEVEL   BUNIONECTOMY Right 10/14/2014   @[facility]    CESAREAN SECTION     COLON SURGERY     COLONOSCOPY     FOOT SURGERY Right    GASTRIC BYPASS     Hammer Toe Repair Right 10/14/2014   RT #2, @[facility]    INCISION AND DRAINAGE HIP Left 06/02/2020   Procedure: IRRIGATION AND DEBRIDEMENT LEFT HIP, POSSIBLE HEAD BALL AND LINER EXCHANGE;  Surgeon: Fidel Rogue, MD;  Location: [facility];  Service: Orthopedics;  Laterality: Left;     KNEE ARTHROPLASTY Right 06/14/2022   Procedure: COMPUTER ASSISTED TOTAL KNEE ARTHROPLASTY;  Surgeon: Fidel Rogue, MD;  Location: [facility];  Service: Orthopedics;  Laterality: Right;  160   TENOTOMY Right 10/14/2014   RT #3, @[facility]    TOTAL HIP ARTHROPLASTY Left 04/28/2020   Procedure: TOTAL HIP ARTHROPLASTY ANTERIOR APPROACH;  Surgeon: Fidel Rogue, MD;  Location: [facility];  Service: Orthopedics;  Laterality: Left;  3E bed   TOTAL HIP ARTHROPLASTY Right 01/25/2021   Procedure: TOTAL HIP ARTHROPLASTY ANTERIOR APPROACH;  Surgeon: Fidel Rogue, MD;  Location: [facility];  Service: Orthopedics;  Laterality: Right;   Patient Active Problem List   Diagnosis Date Noted   Obesity (BMI 30.0-34.9) 10/17/2023   S/P TKR (total knee replacement), right  04/2022 10/17/2023   GERD (gastroesophageal reflux disease) 04/19/2023   Rib pain on right side 02/06/2023   Iron  deficiency anemia 10/16/2021   CKD (chronic kidney disease) stage 3, GFR 30-59 ml/min (HCC) 10/15/2021   RUQ pain 10/12/2021   S/P hip replacement, bilateral 05/30/2021   Pain due to onychomycosis of toenail of right foot 04/25/2021   Osteoarthritis of right hip 01/25/2021   Avascular necrosis of femoral head (HCC) 01/25/2021   Toxic neuropathy 07/08/2020  Osteoarthritis of left hip 02/19/2020   COVID-19 virus infection 04/18/2019   Restless leg syndrome 03/10/2019   Hyperlipidemia 11/27/2018   Anemia 11/25/2018   Nausea 09/17/2018   Right shoulder pain 03/03/2018   Pancreatitis 02/22/2018   Alcohol  abuse 02/22/2018   Cholelithiasis 02/22/2018   Adhesive capsulitis of right shoulder 01/20/2018   Ventral hernia 09/16/2017   Prediabetes 04/16/2017   Spinal stenosis of lumbar region 11/29/2015   Positive urine drug screen 11/16/2015   Umbilical hernia 10/12/2015   Sleeping difficulties 07/15/2015   Bunion, right 06/20/2015   B12 deficiency 03/30/2015   Hammer toe of left foot 02/16/2015   Fibrosis of skin of lower extremity  02/16/2015   Cervical spondylosis with radiculopathy 03/01/2014   Hammer toe of right foot 01/26/2014   Benign intracranial hypertension 06/18/2013   Porokeratosis 03/31/2013   Vitamin D  deficiency 04/18/2010   MEDIAL MENISCUS TEAR, LEFT 06/30/2008   Current tear knee, medial meniscus 06/30/2008   BARRETTS ESOPHAGUS 06/28/2008   Osteoarthritis of right knee 06/28/2008   HYPERTENSION, BENIGN ESSENTIAL 05/24/2008   Irritable bowel syndrome 05/24/2008   BARIATRIC SURGERY STATUS 03/19/2006   History of malignant neoplasm of large intestine 03/19/1997    ONSET DATE: Referral: 10/29/2023  Surgery 10/31/23; Fall 10/19/33  REFERRING DIAG:   D37.477J (ICD-10-CM) - Closed displaced fracture of distal phalanx of left thumb, initial encounter Needs OT to start 4 weeks s/p left thumb closed reduction with perc pinning on 10/31/23  THERAPY DIAG:  Stiffness of thumb joint  Muscle weakness (generalized)  Other symptoms and signs involving the musculoskeletal system  Other lack of coordination  Localized edema  Scar condition and fibrosis of skin  Rationale for Evaluation and Treatment: Rehabilitation  SUBJECTIVE:   SUBJECTIVE STATEMENT:  Pt reported she took pain medicine around 1 pm so her hand was okay but she had some swelling today and she continues to get some tingling when she does her finger exercises.  Patient returned to pool activities this week and kept her hand out of the water .  Reiterated precautions for her left hand to prevent resistance and pressure that could exacerbate her thumb discomfort.  Pt accompanied by: Self   PERTINENT HISTORY:  Patient is a 63 year old female with a history of MS (pt reports she took meds ~ 10 years but then stopped taking it and has had falls etc since then), hypertension, degenerative disc disease on chronic pain medication with Xtampza  and oxycodone  10 mg daily who presented to the hospital 10/21/23 with pain on the left side of her body  after a fall the night before ~9 PM.   Xray results: IMPRESSION: 1. Acute, mildly displaced intra-articular fracture involving the base of the 1st metacarpal with associated mild radial subluxation at the 1st carpometacarpal joint. 2. Mildly displaced intra-articular fracture of the base of the 1st distal phalanx.  PMHx: HTN, IBS, GERD, neuropathy, OA with B hip and R knee replacements, prediabetic, restless leg syndrome, L ring finger injury x 1 year  PRECAUTIONS: Fall risk. MD requesting thumb spica with IP immobilized and tip protection recommendation per [place]  Hand Protocol for post op distal phalanx fracture pg 274; see also post op MCP repair pp [phone]  RED FLAGS: None   WEIGHT BEARING RESTRICTIONS: Yes L hand  PAIN:  Yes: NPRS scale: 4 Pain location: L thumb Pain description: throbbing, stinging Aggravating factors: moving it Relieving factors:  pain meds  FALLS: Has patient fallen in last 6 months? Yes. Number of falls 3 - -  1 with injury; and recently fell out of the truck trying to close the door with opposite hand but was able to ease herself to the ground - Reports she had gone to grocery store but wasn't supposed to drive.  LIVING ENVIRONMENT: Lives with: lives alone and lives with an adult companion - Pt lives/stays between 2 locations ie) her home and her friends home but his place has stairs, daughters visits regularly - oldest daughter is an Charity fundraiser Lives in: House/apartment - was moving between 2 homes (stairs at friends place) Stairs: 5 steps in garage at friends place - this was where she fell Has following equipment at home: Single point cane, Environmental consultant - 4 wheeled, shower chair, Shower bench, bed side commode, Grab bars, and uses shower chair, uses cane - mostly with longer distances  PLOF: Independent, driving, had foot surgery and was in therapy for this when she fell.  PATIENT GOALS: Wants to use her hand better - put her bra and spanx on herself  NEXT MD  VISIT: 12/30/23  OBJECTIVE:  Note: Objective measures were completed at Evaluation unless otherwise noted.  HAND DOMINANCE: Right  ADLs: Overall ADLs: Gets help with tight fitting clothes Transfers/ambulation related to ADLs: Mod Ind Eating: gets help cutting food - depends on what it is Grooming: Help with hair Upper body dressing: Help with bra/spanx Lower body dressing: Pt pick socks and shoes she can manage herself Toileting: Ind with some difficulty reaching at times Bathing: Pt's daughter has been helping her get a good shower Tub shower transfers: Mod Ind with shower chair Equipment: Shower seat with back, Grab bars, and bed side commode  FUNCTIONAL OUTCOME MEASURES: Quick Dash: TBD ~ Educated determination by OTR > 60%   UPPER EXTREMITY ROM:     Active ROM Right eval Left eval  Shoulder flexion [facility]  Shoulder abduction    Shoulder adduction    Shoulder extension    Shoulder internal rotation    Shoulder external rotation    Elbow flexion [facility] WFL  Elbow extension    Wrist flexion 60 40  Wrist extension 70 40  Wrist ulnar deviation  Limited  Wrist radial deviation  Limited  Wrist pronation  WFL  Wrist supination  Limited   (Blank rows = not tested)  Active ROM Right eval Left eval  Thumb MCP (0-60)  55  Thumb IP (0-80)  0  Thumb Radial abd/add (0-55)     Thumb Palmar abd/add (0-45)     Thumb Opposition to Small Finger     Index MCP (0-90)   55  Index PIP (0-100)   65  Index DIP (0-70)    37  Long MCP (0-90)   55   Long PIP (0-100)   70   Long DIP (0-70)   37   Ring MCP (0-90)   55   Ring PIP (0-100)   78   Ring DIP (0-70)    37  Little MCP (0-90)    55  Little PIP (0-100)    78  Little DIP (0-70)   37   (Blank rows = not tested)   UPPER EXTREMITY MMT:     MMT Right eval Left eval  Shoulder flexion 4- 4-  Shoulder abduction    Shoulder adduction    Shoulder extension    Shoulder internal rotation    Shoulder external rotation     Middle trapezius    Lower trapezius    Elbow flexion 4 4-  Elbow  extension 4 4-  Wrist flexion 4 3-  Wrist extension 4 3-  Wrist ulnar deviation    Wrist radial deviation    Wrist pronation 4 4-  Wrist supination 4 2  (Blank rows = not tested)  HAND FUNCTION: Grip strength: Right: TBA lbs; Left: TBA lbs  COORDINATION: 9 Hole Peg test: Right: TBA sec; Left: TBA sec  SENSATION: Not tested  EDEMA: L hand/digits swollen s/p removal of cast today and pins  COGNITION: Overall cognitive status: Within functional limits for tasks assessed  OBSERVATIONS: Pt ambulates with no AE and no obvious loss of balance but with abnormal gait pattern s/p B hip and R knee replacements. The pt appears well kept and was concerned about the dry flaking skin around her hand/digits with education to gentle exfoliate skin but not to apply lotion yet as pin sites are not yet healed s/p removal of pins today.   TODAY'S TREATMENT:                                                                                                                            - Self Care education and training completed for duration as noted below including:  OT reviewed desensitization activities, edema control and scar massage.  Pt has been rubbing her hand but is educated on rubbing it in one direction (distal to proximal) and then provided more compressive stockinette with education to monitor for excess swelling around the sleeve.  Pt also encouraged to massage scar at base of thumb to work on skin flexibility and to prevent any scar adhesions.   - Therapeutic exercises completed for duration as noted below including:  Reviewed and practiced ROM HEP ie) isolated thumb ROM outside of splint using R hand to provide isolation of IP/MCP joint for better movement. Encouraged to continue tendon gliding exercises to also help reduce swelling and promote healthier soft tissue for increased AROM - particularly of digits.   - Orthotic  fabrication for duration as noted below including:  Pt was fitted with a custom fabricated hand based thumb spica splint placing her left thumb in slight ABD and rotation, or the functional position with IP joint stabilized and distal phalanx covered to avoid bumping it and leaving her wrist free for ROM. Pt was educated to wear the thumb spica/wrist splint at night, when driving and when at risk for injury with hand based splint for use at home and when she is not experiencing pain or discomfort with certain activities.    PATIENT EDUCATION: Education details: Splint options; AROM/AAROM exercises, edema and scar management Person educated: Patient Education method: Explanation, Demonstration, Tactile cues, and Verbal cues Education comprehension: verbalized understanding, returned demonstration, verbal cues required, tactile cues required, and needs further education  HOME EXERCISE PROGRAM: 11/28/23: Tendon Gliding Exercises 12/05/23: AROM/AAROM exercises  GOALS: Goals reviewed with patient? Yes  SHORT TERM GOALS: Target date: 12/20/23  Pt will obtain protective, custom forearm based thumb spica splint/orthotic. Baseline: New  to outpt OT Goal status: MET   2.  Pt will demo/state understanding of initial HEP to improve pain levels and prerequisite motion for functional use of left hand/thumb. Baseline: New to outpt OT. Tendon gliding ROM provided at eval. Goal status: MET     LONG TERM GOALS: Target date: 02/07/24   Patient will demonstrate at least 16% improvement with quick Dash score (reporting <50% disability or less) indicating improved functional use of affected extremity.  Baseline: New to outpt OT ~ >63% Goal status: IN PROGRESS   2.  Pt will improve grip strength in left hand from unable to assess to at least 15 lbs for functional use at home and in IADLs. Baseline: New to outpt OT ~ unable to assess Goal status: IN PROGRESS   3.  Pt will improve A/ROM in left thumb from  opposition to tip of index finger on left to at least opposition to PIP (or greater) of left small finger, to have functional motion for tasks like ADLs, reach, and grasp.  Baseline: New to outpt OT ~ No active IP flexion Goal status: IN PROGRESS   4.  Pt will improve left hand functional use from unable to assess to at least Mod I for basic ADLs (ie, brushing her hair, bathing, brushing her teeth and folding laundry) to assist in ability to carry out self-care and higher-level homecare tasks with less difficulty. Baseline: New to outpt OT with family assistance with bra/spanx Goal status: INITIAL   5.  Pt will improve coordination skills in left hand, as seen by better score on 9 hole peg testing to have increased functional ability to carry out fine motor tasks (fasteners, etc.) and more complex, coordinated IADLs (meal prep, sports, etc.).  Baseline: New to outpt OT - unable to complete due to poor IP flexion Goal status: INITIAL/TBD   6.  Pt will decrease pain at worst from 5/10 to 2/10 or less to have better sleep and occupational participation in daily roles. Baseline: New to outpt OT Goal status: IN PROGRESS  ASSESSMENT:  CLINICAL IMPRESSION:  Patient presents with decreased functional use of the left thumb following a distal phalanx fracture. She tolerated new hand based thumb spica splint fabrication today and continues to demonstrate good rehab potential as evidence to verbalizing understanding of HEP and orthosis maintenance protocols. She will continue to benefit from skilled outpatient OT to improve functional use of L hand for ADLs and IADLs.   PERFORMANCE DEFICITS: in functional skills including ADLs, IADLs, coordination, dexterity, proprioception, sensation, edema, tone, ROM, strength, pain, fascial restrictions, muscle spasms, flexibility, Fine motor control, Gross motor control, mobility, balance, body mechanics, endurance, decreased knowledge of precautions, decreased  knowledge of use of DME, wound, skin integrity, and UE functional use, cognitive skills including memory, problem solving, and safety awareness, and psychosocial skills including coping strategies, environmental adaptation, and routines and behaviors.   IMPAIRMENTS: are limiting patient from ADLs, IADLs, rest and sleep, leisure, and social participation.   COMORBIDITIES: has co-morbidities such as OA that affects occupational performance. Patient will benefit from skilled OT to address above impairments and improve overall function.  PLAN:  OT FREQUENCY: 1-2x/week   OT DURATION: 10 weeks   PLANNED INTERVENTIONS: 97535 self care/ADL training, 02889 therapeutic exercise, 97530 therapeutic activity, 97140 manual therapy, 97035 ultrasound, 97018 paraffin, 02960 fluidotherapy, 97010 moist heat, 97010 cryotherapy, 97763 Orthotics management and training, 02239 Splinting (initial encounter), scar mobilization, passive range of motion, patient/family education, and DME and/or AE instructions  PLAN  FOR NEXT SESSION:  Conduct grip and coordination tests Splint check and adjustments PRN Update HEP per [place]  Protocol for thumb fracture Edema control/scar management PRN Begin strengthening ~8 week mark postoperatively when cleared by Dr Erwin.   Per [place]  Hand Protocol for post op MCP pp [phone] and distal phalanx fracture pg 274  Clarita LITTIE Pride, OT 12/10/2023, 5:22 PM

## 2023-12-17 ENCOUNTER — Other Ambulatory Visit: Payer: Self-pay | Admitting: Internal Medicine

## 2023-12-18 ENCOUNTER — Ambulatory Visit: Payer: Self-pay | Attending: Orthopedic Surgery | Admitting: Occupational Therapy

## 2023-12-18 DIAGNOSIS — R278 Other lack of coordination: Secondary | ICD-10-CM | POA: Insufficient documentation

## 2023-12-18 DIAGNOSIS — R600 Localized edema: Secondary | ICD-10-CM | POA: Insufficient documentation

## 2023-12-18 DIAGNOSIS — R208 Other disturbances of skin sensation: Secondary | ICD-10-CM | POA: Diagnosis present

## 2023-12-18 DIAGNOSIS — M25649 Stiffness of unspecified hand, not elsewhere classified: Secondary | ICD-10-CM | POA: Insufficient documentation

## 2023-12-18 DIAGNOSIS — R29898 Other symptoms and signs involving the musculoskeletal system: Secondary | ICD-10-CM | POA: Insufficient documentation

## 2023-12-18 DIAGNOSIS — M6281 Muscle weakness (generalized): Secondary | ICD-10-CM | POA: Insufficient documentation

## 2023-12-18 DIAGNOSIS — M25542 Pain in joints of left hand: Secondary | ICD-10-CM | POA: Diagnosis present

## 2023-12-18 DIAGNOSIS — L905 Scar conditions and fibrosis of skin: Secondary | ICD-10-CM | POA: Diagnosis present

## 2023-12-18 NOTE — Therapy (Addendum)
 OUTPATIENT OCCUPATIONAL THERAPY ORTHO TREATMENT  Patient Name: Misty [name] MRN: [id number] DOB:[birth year], 63 y.o., female Today's Date: 12/18/2023  PCP: Geofm Glade PARAS, MD REFERRING PROVIDER: Arlinda Buster, MD  END OF SESSION:  OT End of Session - 12/18/23 1455     Visit Number 4    Number of Visits 16    Date for Recertification  02/07/24    Authorization Type [employer] Dual 2025 Covered 100 %    Authorization Time Period NO AUTH REQUIRED VL: [place]    Progress Note Due on Visit 10    OT Start Time 1456    OT Stop Time 1537    OT Time Calculation (min) 41 min    Equipment Utilized During Treatment 9 hole peg    Activity Tolerance Patient tolerated treatment well    Behavior During Therapy WFL for tasks assessed/performed          Past Medical History:  Diagnosis Date   Allergy    Arthritis    Back pain    Colon cancer (HCC)    Depression    Diarrhea    GERD (gastroesophageal reflux disease)    Hypertension    Insomnia    MS (multiple sclerosis) 2000   pt says that she doesn't have it   Other hammer toe (acquired) 03/31/2013   Pneumonia    hx of   Pre-diabetes    Umbilical hernia    Past Surgical History:  Procedure Laterality Date   ABDOMINAL HYSTERECTOMY     ANTERIOR CERVICAL DECOMP/DISCECTOMY FUSION N/A 03/01/2014   Procedure: ANTERIOR CERVICAL DECOMPRESSION/DISCECTOMY FUSION 1 LEVEL;  Surgeon: Victory DELENA Gunnels, MD;  Location: [facility];  Service: Neurosurgery;  Laterality: N/A;  ANTERIOR CERVICAL DECOMPRESSION/DISCECTOMY FUSION 1 LEVEL   BUNIONECTOMY Right 10/14/2014   @[facility]    CESAREAN SECTION     COLON SURGERY     COLONOSCOPY     FOOT SURGERY Right    GASTRIC BYPASS     Hammer Toe Repair Right 10/14/2014   RT #2, @[facility]    INCISION AND DRAINAGE HIP Left 06/02/2020   Procedure: IRRIGATION AND DEBRIDEMENT LEFT HIP, POSSIBLE HEAD BALL AND LINER EXCHANGE;  Surgeon: Fidel Rogue, MD;  Location: [facility];  Service: Orthopedics;  Laterality: Left;    KNEE  ARTHROPLASTY Right 06/14/2022   Procedure: COMPUTER ASSISTED TOTAL KNEE ARTHROPLASTY;  Surgeon: Fidel Rogue, MD;  Location: [facility];  Service: Orthopedics;  Laterality: Right;  160   TENOTOMY Right 10/14/2014   RT #3, @[facility]    TOTAL HIP ARTHROPLASTY Left 04/28/2020   Procedure: TOTAL HIP ARTHROPLASTY ANTERIOR APPROACH;  Surgeon: Fidel Rogue, MD;  Location: [facility];  Service: Orthopedics;  Laterality: Left;  3E bed   TOTAL HIP ARTHROPLASTY Right 01/25/2021   Procedure: TOTAL HIP ARTHROPLASTY ANTERIOR APPROACH;  Surgeon: Fidel Rogue, MD;  Location: [facility];  Service: Orthopedics;  Laterality: Right;   Patient Active Problem List   Diagnosis Date Noted   Obesity (BMI 30.0-34.9) 10/17/2023   S/P TKR (total knee replacement), right  04/2022 10/17/2023   GERD (gastroesophageal reflux disease) 04/19/2023   Rib pain on right side 02/06/2023   Iron  deficiency anemia 10/16/2021   CKD (chronic kidney disease) stage 3, GFR 30-59 ml/min (HCC) 10/15/2021   RUQ pain 10/12/2021   S/P hip replacement, bilateral 05/30/2021   Pain due to onychomycosis of toenail of right foot 04/25/2021   Osteoarthritis of right hip 01/25/2021   Avascular necrosis of femoral head (HCC) 01/25/2021   Toxic neuropathy 07/08/2020  Osteoarthritis of left hip 02/19/2020   COVID-19 virus infection 04/18/2019   Restless leg syndrome 03/10/2019   Hyperlipidemia 11/27/2018   Anemia 11/25/2018   Nausea 09/17/2018   Right shoulder pain 03/03/2018   Pancreatitis 02/22/2018   Alcohol  abuse 02/22/2018   Cholelithiasis 02/22/2018   Adhesive capsulitis of right shoulder 01/20/2018   Ventral hernia 09/16/2017   Prediabetes 04/16/2017   Spinal stenosis of lumbar region 11/29/2015   Positive urine drug screen 11/16/2015   Umbilical hernia 10/12/2015   Sleeping difficulties 07/15/2015   Bunion, right 06/20/2015   B12 deficiency 03/30/2015   Hammer toe of left foot 02/16/2015   Fibrosis of skin of lower extremity 02/16/2015    Cervical spondylosis with radiculopathy 03/01/2014   Hammer toe of right foot 01/26/2014   Benign intracranial hypertension 06/18/2013   Porokeratosis 03/31/2013   Vitamin D  deficiency 04/18/2010   MEDIAL MENISCUS TEAR, LEFT 06/30/2008   Current tear knee, medial meniscus 06/30/2008   BARRETTS ESOPHAGUS 06/28/2008   Osteoarthritis of right knee 06/28/2008   HYPERTENSION, BENIGN ESSENTIAL 05/24/2008   Irritable bowel syndrome 05/24/2008   BARIATRIC SURGERY STATUS 03/19/2006   History of malignant neoplasm of large intestine 03/19/1997    ONSET DATE: Referral: 10/29/2023  Surgery 10/31/23; Fall 10/19/33  REFERRING DIAG:   D37.477J (ICD-10-CM) - Closed displaced fracture of distal phalanx of left thumb, initial encounter Needs OT to start 4 weeks s/p left thumb closed reduction with perc pinning on 10/31/23  THERAPY DIAG:  Stiffness of thumb joint  Muscle weakness (generalized)  Other symptoms and signs involving the musculoskeletal system  Other lack of coordination  Localized edema  Scar condition and fibrosis of skin  Rationale for Evaluation and Treatment: Rehabilitation  SUBJECTIVE:   SUBJECTIVE STATEMENT: Patient reported that she has been compliant with her HEP, performing the exercises five times per day. She mentioned experiencing pain in her left hand while swimming.Patient was reminded to avoid placing her left hand in water  during swimming activities to minimize resistance and pressure, which may exacerbate discomfort in the thumb.  Pt accompanied by: Self and granddaughter   PERTINENT HISTORY:  Patient is a 63 year old female with a history of MS (pt reports she took meds ~ 10 years but then stopped taking it and has had falls etc since then), hypertension, degenerative disc disease on chronic pain medication with Xtampza  and oxycodone  10 mg daily who presented to the hospital 10/21/23 with pain on the left side of her body after a fall the night before ~9 PM.    Xray results: IMPRESSION: 1. Acute, mildly displaced intra-articular fracture involving the base of the 1st metacarpal with associated mild radial subluxation at the 1st carpometacarpal joint. 2. Mildly displaced intra-articular fracture of the base of the 1st distal phalanx.  PMHx: HTN, IBS, GERD, neuropathy, OA with B hip and R knee replacements, prediabetic, restless leg syndrome, L ring finger injury x 1 year  PRECAUTIONS: Fall risk. MD requesting thumb spica with IP immobilized and tip protection recommendation per [place]  Hand Protocol for post op distal phalanx fracture pg 274; see also post op MCP repair pp [phone]  RED FLAGS: None   WEIGHT BEARING RESTRICTIONS: Yes L hand  PAIN:  Yes: NPRS scale: 5 Pain location: L thumb all the way down to the radial side of the L wrist Pain description: throbbing, stinging Aggravating factors: moving it Relieving factors:  pain meds  FALLS: Has patient fallen in last 6 months? Yes. Number of falls 3 - - 1 with  injury; and recently fell out of the truck trying to close the door with opposite hand but was able to ease herself to the ground - Reports she had gone to grocery store but wasn't supposed to drive.  LIVING ENVIRONMENT: Lives with: lives alone and lives with an adult companion - Pt lives/stays between 2 locations ie) her home and her friends home but his place has stairs, daughters visits regularly - oldest daughter is an Charity fundraiser Lives in: House/apartment - was moving between 2 homes (stairs at friends place) Stairs: 5 steps in garage at friends place - this was where she fell Has following equipment at home: Single point cane, Environmental consultant - 4 wheeled, shower chair, Shower bench, bed side commode, Grab bars, and uses shower chair, uses cane - mostly with longer distances  PLOF: Independent, driving, had foot surgery and was in therapy for this when she fell.  PATIENT GOALS: Wants to use her hand better - put her bra and spanx on  herself  NEXT MD VISIT: 12/30/23  OBJECTIVE:  Note: Objective measures were completed at Evaluation unless otherwise noted.  HAND DOMINANCE: Right  ADLs: Overall ADLs: Gets help with tight fitting clothes Transfers/ambulation related to ADLs: Mod Ind Eating: gets help cutting food - depends on what it is Grooming: Help with hair Upper body dressing: Help with bra/spanx Lower body dressing: Pt pick socks and shoes she can manage herself Toileting: Ind with some difficulty reaching at times Bathing: Pt's daughter has been helping her get a good shower Tub shower transfers: Mod Ind with shower chair Equipment: Shower seat with back, Grab bars, and bed side commode  FUNCTIONAL OUTCOME MEASURES: Quick Dash: TBD ~ Educated determination by OTR > 60%   UPPER EXTREMITY ROM:     Active ROM Right eval Left eval  Shoulder flexion [facility]  Shoulder abduction    Shoulder adduction    Shoulder extension    Shoulder internal rotation    Shoulder external rotation    Elbow flexion [facility] Of Care WFL  Elbow extension    Wrist flexion 60 40  Wrist extension 70 40  Wrist ulnar deviation  Limited  Wrist radial deviation  Limited  Wrist pronation  WFL  Wrist supination  Limited   (Blank rows = not tested)  Active ROM Right eval Left eval  Thumb MCP (0-60)  55  Thumb IP (0-80)  0  Thumb Radial abd/add (0-55)     Thumb Palmar abd/add (0-45)     Thumb Opposition to Small Finger     Index MCP (0-90)   55  Index PIP (0-100)   65  Index DIP (0-70)    37  Long MCP (0-90)   55   Long PIP (0-100)   70   Long DIP (0-70)   37   Ring MCP (0-90)   55   Ring PIP (0-100)   78   Ring DIP (0-70)    37  Little MCP (0-90)    55  Little PIP (0-100)    78  Little DIP (0-70)   37   (Blank rows = not tested)   UPPER EXTREMITY MMT:     MMT Right eval Left eval  Shoulder flexion 4- 4-  Shoulder abduction    Shoulder adduction    Shoulder extension    Shoulder internal rotation    Shoulder  external rotation    Middle trapezius    Lower trapezius    Elbow flexion 4 4-  Elbow extension 4  4-  Wrist flexion 4 3-  Wrist extension 4 3-  Wrist ulnar deviation    Wrist radial deviation    Wrist pronation 4 4-  Wrist supination 4 2  (Blank rows = not tested)  HAND FUNCTION: Grip strength: Right: TBA lbs; Left: TBA lbs  COORDINATION: 12/18/2023: 9 Hole Peg test: Right: 24.29 sec; Left: 26.46 sec  SENSATION: Not tested  EDEMA: L hand/digits swollen s/p removal of cast today and pins  COGNITION: Overall cognitive status: Within functional limits for tasks assessed  OBSERVATIONS: Pt ambulates with no AE and no obvious loss of balance but with abnormal gait pattern s/p B hip and R knee replacements. The pt appears well kept and was concerned about the dry flaking skin around her hand/digits with education to gentle exfoliate skin but not to apply lotion yet as pin sites are not yet healed s/p removal of pins today.   TODAY'S TREATMENT:     - Therapeutic exercises completed for duration as noted below including:  OT reviewed HEP at the start of the session. From observation, pt is making progress with digit mobility as evidenced by ability to demonstrate greater digit opposition. Pt is still having trouble with movement at the IP joint. However, when isolating pt has better mobility.   Pt placed BUE in Fluidotherapy machine with supervised ROM x 10 min. Pt was educated to complete LUE PROM during modality time to improve ROM and decrease pain/stiffness of affected extremity by use of the machine's massaging action and thermal properties.    - Orthotic subsequent for duration as noted below including: Pt reported discomfort around the first MCP joint. OT adjusted splint as needed for better comfort and fit. Pt has still been compliant with splint wear and had no further concerns.    Pt also completed 9 hole peg test today with the following results; Right: 24.29 sec; Left:  26.46 sec  PATIENT EDUCATION: Education details: AROM/AAROM exercises, edema and scar management Person educated: Patient Education method: Explanation, Demonstration, Tactile cues, and Verbal cues Education comprehension: verbalized understanding, returned demonstration, verbal cues required, tactile cues required, and needs further education  HOME EXERCISE PROGRAM: 11/28/23: Tendon Gliding Exercises 12/05/23: AROM/AAROM exercises  GOALS: Goals reviewed with patient? Yes  SHORT TERM GOALS: Target date: 12/20/23  Pt will obtain protective, custom forearm based thumb spica splint/orthotic. Baseline: New to outpt OT Goal status: MET   2.  Pt will demo/state understanding of initial HEP to improve pain levels and prerequisite motion for functional use of left hand/thumb. Baseline: New to outpt OT. Tendon gliding ROM provided at eval. Goal status: MET     LONG TERM GOALS: Target date: 02/07/24   Patient will demonstrate at least 16% improvement with quick Dash score (reporting <50% disability or less) indicating improved functional use of affected extremity.  Baseline: New to outpt OT ~ >63% Goal status: IN PROGRESS   2.  Pt will improve grip strength in left hand from unable to assess to at least 15 lbs for functional use at home and in IADLs. Baseline: New to outpt OT ~ unable to assess Goal status: IN PROGRESS   3.  Pt will improve A/ROM in left thumb from opposition to tip of index finger on left to at least opposition to PIP (or greater) of left small finger, to have functional motion for tasks like ADLs, reach, and grasp.  Baseline: New to outpt OT ~ No active IP flexion Goal status: IN PROGRESS   4.  Pt  will improve left hand functional use from unable to assess to at least Mod I for basic ADLs (ie, brushing her hair, bathing, brushing her teeth and folding laundry) to assist in ability to carry out self-care and higher-level homecare tasks with less difficulty. Baseline: New  to outpt OT with family assistance with bra/spanx Goal status: IN Progress   5.  Pt will improve coordination skills in left hand, as seen by better score on 9 hole peg testing to have increased functional ability to carry out fine motor tasks (fasteners, etc.) and more complex, coordinated IADLs (meal prep, sports, etc.).  Baseline: New to outpt OT - unable to complete due to poor IP flexion Goal status: IN Progress  12/18/23 9 Hole Peg test: Right: 24.29 sec; Left: 26.46 sec   6.  Pt will decrease pain at worst from 5/10 to 2/10 or less to have better sleep and occupational participation in daily roles. Baseline: New to outpt OT Goal status: IN PROGRESS  ASSESSMENT:  CLINICAL IMPRESSION:  Patient presents with ongoing limits with functional use of the left thumb following a distal phalanx fracture. She had minor adjustments to hand based thumb spica splint for improved comfort and continues to demonstrate good rehab potential as evidenced by verbalizing understanding of HEP activities, frequency of HEP tasks and bringing HEP printouts with her as well as continuing with orthotic  maintenance protocols. She will continue to benefit from skilled outpatient OT to improve functional use of L hand for ADLs and IADLs.   PERFORMANCE DEFICITS: in functional skills including ADLs, IADLs, coordination, dexterity, proprioception, sensation, edema, tone, ROM, strength, pain, fascial restrictions, muscle spasms, flexibility, Fine motor control, Gross motor control, mobility, balance, body mechanics, endurance, decreased knowledge of precautions, decreased knowledge of use of DME, wound, skin integrity, and UE functional use, cognitive skills including memory, problem solving, and safety awareness, and psychosocial skills including coping strategies, environmental adaptation, and routines and behaviors.   IMPAIRMENTS: are limiting patient from ADLs, IADLs, rest and sleep, leisure, and social participation.    COMORBIDITIES: has co-morbidities such as OA that affects occupational performance. Patient will benefit from skilled OT to address above impairments and improve overall function.  PLAN:  OT FREQUENCY: 1-2x/week   OT DURATION: 10 weeks   PLANNED INTERVENTIONS: 97535 self care/ADL training, 02889 therapeutic exercise, 97530 therapeutic activity, 97140 manual therapy, 97035 ultrasound, 97018 paraffin, 02960 fluidotherapy, 97010 moist heat, 97010 cryotherapy, 97763 Orthotics management and training, 02239 Splinting (initial encounter), scar mobilization, passive range of motion, patient/family education, and DME and/or AE instructions  PLAN FOR NEXT SESSION:  Conduct grip strength Splint check and adjustments PRN Update HEP per [place]  Protocol for thumb fracture Edema control/scar management PRN Begin strengthening ~8 week mark (12/26/23) postoperatively when cleared by Dr Erwin.   Per [place]  Hand Protocol for post op MCP pp [phone] and distal phalanx fracture pg 274  Previn Jian, Student-OT 12/18/2023, 4:08 PM

## 2023-12-19 ENCOUNTER — Ambulatory Visit

## 2023-12-21 ENCOUNTER — Other Ambulatory Visit: Payer: Self-pay | Admitting: Internal Medicine

## 2023-12-24 ENCOUNTER — Ambulatory Visit: Payer: Self-pay | Admitting: Occupational Therapy

## 2023-12-24 DIAGNOSIS — R278 Other lack of coordination: Secondary | ICD-10-CM

## 2023-12-24 DIAGNOSIS — M25542 Pain in joints of left hand: Secondary | ICD-10-CM

## 2023-12-24 DIAGNOSIS — M25649 Stiffness of unspecified hand, not elsewhere classified: Secondary | ICD-10-CM

## 2023-12-24 DIAGNOSIS — R208 Other disturbances of skin sensation: Secondary | ICD-10-CM

## 2023-12-24 DIAGNOSIS — R29898 Other symptoms and signs involving the musculoskeletal system: Secondary | ICD-10-CM

## 2023-12-24 DIAGNOSIS — R600 Localized edema: Secondary | ICD-10-CM

## 2023-12-24 DIAGNOSIS — M6281 Muscle weakness (generalized): Secondary | ICD-10-CM

## 2023-12-24 NOTE — Therapy (Signed)
 OUTPATIENT OCCUPATIONAL THERAPY ORTHO TREATMENT  Patient Name: Misty [name] MRN: [id number] DOB:[birth year], 62 y.o., female Today's Date: 12/24/2023  PCP: Geofm Glade PARAS, MD REFERRING PROVIDER: Arlinda Buster, MD  END OF SESSION:  OT End of Session - 12/24/23 1451     Visit Number 5    Number of Visits 16    Date for Recertification  02/07/24    Authorization Type [employer] Dual 2025 Covered 100 %    Authorization Time Period NO AUTH REQUIRED VL: [place]    Progress Note Due on Visit 10    OT Start Time 1451    OT Stop Time 1530    OT Time Calculation (min) 39 min    Activity Tolerance Patient tolerated treatment well    Behavior During Therapy WFL for tasks assessed/performed          Past Medical History:  Diagnosis Date   Allergy    Arthritis    Back pain    Colon cancer (HCC)    Depression    Diarrhea    GERD (gastroesophageal reflux disease)    Hypertension    Insomnia    MS (multiple sclerosis) 2000   pt says that she doesn't have it   Other hammer toe (acquired) 03/31/2013   Pneumonia    hx of   Pre-diabetes    Umbilical hernia    Past Surgical History:  Procedure Laterality Date   ABDOMINAL HYSTERECTOMY     ANTERIOR CERVICAL DECOMP/DISCECTOMY FUSION N/A 03/01/2014   Procedure: ANTERIOR CERVICAL DECOMPRESSION/DISCECTOMY FUSION 1 LEVEL;  Surgeon: Victory DELENA Gunnels, MD;  Location: [facility];  Service: Neurosurgery;  Laterality: N/A;  ANTERIOR CERVICAL DECOMPRESSION/DISCECTOMY FUSION 1 LEVEL   BUNIONECTOMY Right 10/14/2014   @[facility]    CESAREAN SECTION     COLON SURGERY     COLONOSCOPY     FOOT SURGERY Right    GASTRIC BYPASS     Hammer Toe Repair Right 10/14/2014   RT #2, @[facility]    INCISION AND DRAINAGE HIP Left 06/02/2020   Procedure: IRRIGATION AND DEBRIDEMENT LEFT HIP, POSSIBLE HEAD BALL AND LINER EXCHANGE;  Surgeon: Fidel Rogue, MD;  Location: [facility];  Service: Orthopedics;  Laterality: Left;    KNEE ARTHROPLASTY Right 06/14/2022   Procedure: COMPUTER  ASSISTED TOTAL KNEE ARTHROPLASTY;  Surgeon: Fidel Rogue, MD;  Location: [facility];  Service: Orthopedics;  Laterality: Right;  160   TENOTOMY Right 10/14/2014   RT #3, @[facility]    TOTAL HIP ARTHROPLASTY Left 04/28/2020   Procedure: TOTAL HIP ARTHROPLASTY ANTERIOR APPROACH;  Surgeon: Fidel Rogue, MD;  Location: [facility];  Service: Orthopedics;  Laterality: Left;  3E bed   TOTAL HIP ARTHROPLASTY Right 01/25/2021   Procedure: TOTAL HIP ARTHROPLASTY ANTERIOR APPROACH;  Surgeon: Fidel Rogue, MD;  Location: [facility];  Service: Orthopedics;  Laterality: Right;   Patient Active Problem List   Diagnosis Date Noted   Obesity (BMI 30.0-34.9) 10/17/2023   S/P TKR (total knee replacement), right  04/2022 10/17/2023   GERD (gastroesophageal reflux disease) 04/19/2023   Rib pain on right side 02/06/2023   Iron  deficiency anemia 10/16/2021   CKD (chronic kidney disease) stage 3, GFR 30-59 ml/min (HCC) 10/15/2021   RUQ pain 10/12/2021   S/P hip replacement, bilateral 05/30/2021   Pain due to onychomycosis of toenail of right foot 04/25/2021   Osteoarthritis of right hip 01/25/2021   Avascular necrosis of femoral head (HCC) 01/25/2021   Toxic neuropathy 07/08/2020   Osteoarthritis of left hip 02/19/2020   COVID-19  virus infection 04/18/2019   Restless leg syndrome 03/10/2019   Hyperlipidemia 11/27/2018   Anemia 11/25/2018   Nausea 09/17/2018   Right shoulder pain 03/03/2018   Pancreatitis 02/22/2018   Alcohol  abuse 02/22/2018   Cholelithiasis 02/22/2018   Adhesive capsulitis of right shoulder 01/20/2018   Ventral hernia 09/16/2017   Prediabetes 04/16/2017   Spinal stenosis of lumbar region 11/29/2015   Positive urine drug screen 11/16/2015   Umbilical hernia 10/12/2015   Sleeping difficulties 07/15/2015   Bunion, right 06/20/2015   B12 deficiency 03/30/2015   Hammer toe of left foot 02/16/2015   Fibrosis of skin of lower extremity 02/16/2015   Cervical spondylosis with radiculopathy 03/01/2014    Hammer toe of right foot 01/26/2014   Benign intracranial hypertension 06/18/2013   Porokeratosis 03/31/2013   Vitamin D  deficiency 04/18/2010   MEDIAL MENISCUS TEAR, LEFT 06/30/2008   Current tear knee, medial meniscus 06/30/2008   BARRETTS ESOPHAGUS 06/28/2008   Osteoarthritis of right knee 06/28/2008   HYPERTENSION, BENIGN ESSENTIAL 05/24/2008   Irritable bowel syndrome 05/24/2008   BARIATRIC SURGERY STATUS 03/19/2006   History of malignant neoplasm of large intestine 03/19/1997    ONSET DATE: Referral: 10/29/2023  Surgery 10/31/23; Fall 10/19/33  REFERRING DIAG:   D37.477J (ICD-10-CM) - Closed displaced fracture of distal phalanx of left thumb, initial encounter Needs OT to start 4 weeks s/p left thumb closed reduction with perc pinning on 10/31/23  THERAPY DIAG:  Stiffness of thumb joint  Pain in joint of left hand  Muscle weakness (generalized)  Other lack of coordination  Localized edema  Other disturbances of skin sensation  Other symptoms and signs involving the musculoskeletal system  Rationale for Evaluation and Treatment: Rehabilitation  SUBJECTIVE:   SUBJECTIVE STATEMENT: Patient reported that she has been working on cooking some dishes for people for homecoming this weekend. She is wearing her splint and getting help to pick up pots and pans etc. She mentioned again experiencing pain in her left hand with water  aerobics again but noted she had been moving her hand in the water .  Education provide again hand protection with water  activities to minimize resistance and pressure, which may exacerbate discomfort in the thumb.  Pt accompanied by: Self   PERTINENT HISTORY:  Patient is a 63 year old female with a history of MS (pt reports she took meds ~ 10 years but then stopped taking it and has had falls etc since then), hypertension, degenerative disc disease on chronic pain medication with Xtampza  and oxycodone  10 mg daily who presented to the hospital 10/21/23  with pain on the left side of her body after a fall the night before ~9 PM.   Xray results: IMPRESSION: 1. Acute, mildly displaced intra-articular fracture involving the base of the 1st metacarpal with associated mild radial subluxation at the 1st carpometacarpal joint. 2. Mildly displaced intra-articular fracture of the base of the 1st distal phalanx.  PMHx: HTN, IBS, GERD, neuropathy, OA with B hip and R knee replacements, prediabetic, restless leg syndrome, L ring finger injury x 1 year  PRECAUTIONS: Fall risk. MD requesting thumb spica with IP immobilized and tip protection recommendation per [place]  Hand Protocol for post op distal phalanx fracture pg 274; see also post op MCP repair pp [phone]  RED FLAGS: None   WEIGHT BEARING RESTRICTIONS: Yes L hand  PAIN:  Yes: NPRS scale: 6-7 upon arrival and down to 3 with fluidotherapy Pain location: L thumb all the way down to the radial side of the L wrist Pain description:  throbbing, stinging Aggravating factors: moving it Relieving factors:  pain meds  FALLS: Has patient fallen in last 6 months? Yes. Number of falls 3 - - 1 with injury; and recently fell out of the truck trying to close the door with opposite hand but was able to ease herself to the ground - Reports she had gone to grocery store but wasn't supposed to drive.  LIVING ENVIRONMENT: Lives with: lives alone and lives with an adult companion - Pt lives/stays between 2 locations ie) her home and her friends home but his place has stairs, daughters visits regularly - oldest daughter is an Charity fundraiser Lives in: House/apartment - was moving between 2 homes (stairs at friends place) Stairs: 5 steps in garage at friends place - this was where she fell Has following equipment at home: Single point cane, Environmental consultant - 4 wheeled, shower chair, Shower bench, bed side commode, Grab bars, and uses shower chair, uses cane - mostly with longer distances  PLOF: Independent, driving, had foot surgery  and was in therapy for this when she fell.  PATIENT GOALS: Wants to use her hand better - put her bra and spanx on herself  NEXT MD VISIT: 12/30/23  OBJECTIVE:  Note: Objective measures were completed at Evaluation unless otherwise noted.  HAND DOMINANCE: Right  ADLs: Overall ADLs: Gets help with tight fitting clothes Transfers/ambulation related to ADLs: Mod Ind Eating: gets help cutting food - depends on what it is Grooming: Help with hair Upper body dressing: Help with bra/spanx Lower body dressing: Pt pick socks and shoes she can manage herself Toileting: Ind with some difficulty reaching at times Bathing: Pt's daughter has been helping her get a good shower Tub shower transfers: Mod Ind with shower chair Equipment: Shower seat with back, Grab bars, and bed side commode  FUNCTIONAL OUTCOME MEASURES: Quick Dash: TBD ~ Educated determination by OTR > 60%   UPPER EXTREMITY ROM:     Active ROM Right eval Left eval  Shoulder flexion [facility]  Shoulder abduction    Shoulder adduction    Shoulder extension    Shoulder internal rotation    Shoulder external rotation    Elbow flexion [facility] WFL  Elbow extension    Wrist flexion 60 40  Wrist extension 70 40  Wrist ulnar deviation  Limited  Wrist radial deviation  Limited  Wrist pronation  WFL  Wrist supination  Limited   (Blank rows = not tested)  Active ROM Right eval Left eval  Thumb MCP (0-60)  55  Thumb IP (0-80)  0  Thumb Radial abd/add (0-55)     Thumb Palmar abd/add (0-45)     Thumb Opposition to Small Finger     Index MCP (0-90)   55  Index PIP (0-100)   65  Index DIP (0-70)    37  Long MCP (0-90)   55   Long PIP (0-100)   70   Long DIP (0-70)   37   Ring MCP (0-90)   55   Ring PIP (0-100)   78   Ring DIP (0-70)    37  Little MCP (0-90)    55  Little PIP (0-100)    78  Little DIP (0-70)   37   (Blank rows = not tested)   UPPER EXTREMITY MMT:     MMT Right eval Left eval  Shoulder flexion 4- 4-   Shoulder abduction    Shoulder adduction    Shoulder extension    Shoulder  internal rotation    Shoulder external rotation    Middle trapezius    Lower trapezius    Elbow flexion 4 4-  Elbow extension 4 4-  Wrist flexion 4 3-  Wrist extension 4 3-  Wrist ulnar deviation    Wrist radial deviation    Wrist pronation 4 4-  Wrist supination 4 2  (Blank rows = not tested)  HAND FUNCTION: Grip strength: Right: TBA lbs; Left: TBA lbs  COORDINATION: 12/18/2023: 9 Hole Peg test: Right: 24.29 sec; Left: 26.46 sec  SENSATION: Not tested  EDEMA: L hand/digits swollen s/p removal of cast today and pins  COGNITION: Overall cognitive status: Within functional limits for tasks assessed  OBSERVATIONS: Pt ambulates with no AE and no obvious loss of balance but with abnormal gait pattern s/p B hip and R knee replacements. The pt appears well kept and was concerned about the dry flaking skin around her hand/digits with education to gentle exfoliate skin but not to apply lotion yet as pin sites are not yet healed s/p removal of pins today.   TODAY'S TREATMENT:     - Therapeutic exercises completed for duration as noted below including:   Pt placed BUE in Fluidotherapy machine with supervised ROM x 15 min. Pt was educated to complete LUE PROM to thumb IP along with other AROM (tendon glides with gentle thumb adduction across digits) during modality time to improve ROM and decrease pain/stiffness of affected extremity by use of the machine's massaging action and thermal properties.   Upon removing hand from machine pt reported pina decreased from 6-7/10 down to a 3/10.  Reviewed thumb IP flexion with need for ongoing edema control noted and thumb sleeve provided.    Reviewed and practiced IP PROM with proximal phalanx stabilized.   - Self Care education and training completed for duration as noted below including: Reviewed joint protection ideas to help with cooking activities and reviewed AE  considerations for chopping food etc.  Pt encouraged to consider joint protection principles ie) - Respecting for Pain and stopping activities before they reach the point of discomfort or pain  - Rest and Work Balance ie) balancing activities with appropriate rests during activity - Reduction of Effort - Use two hands instead of one if possible - Use of Assistive Equipment - Consider splint use and AE equipment to protect joints from deformity and stresses with review of activities that can be modified with adaptive equipment    PATIENT EDUCATION: Education details: AROM/AAROM exercises, edema and scar management Person educated: Patient Education method: Explanation, Demonstration, Tactile cues, and Verbal cues Education comprehension: verbalized understanding, returned demonstration, verbal cues required, tactile cues required, and needs further education  HOME EXERCISE PROGRAM: 11/28/23: Tendon Gliding Exercises 12/05/23: AROM/AAROM exercises  GOALS: Goals reviewed with patient? Yes  SHORT TERM GOALS: Target date: 12/20/23  Pt will obtain protective, custom forearm based thumb spica splint/orthotic. Baseline: New to outpt OT Goal status: MET   2.  Pt will demo/state understanding of initial HEP to improve pain levels and prerequisite motion for functional use of left hand/thumb. Baseline: New to outpt OT. Tendon gliding ROM provided at eval. Goal status: MET     LONG TERM GOALS: Target date: 02/07/24   Patient will demonstrate at least 16% improvement with quick Dash score (reporting <50% disability or less) indicating improved functional use of affected extremity.  Baseline: New to outpt OT ~ >63% Goal status: IN PROGRESS   2.  Pt will improve grip strength in  left hand from unable to assess to at least 15 lbs for functional use at home and in IADLs. Baseline: New to outpt OT ~ unable to assess Goal status: IN PROGRESS   3.  Pt will improve A/ROM in left thumb from opposition  to tip of index finger on left to at least opposition to PIP (or greater) of left small finger, to have functional motion for tasks like ADLs, reach, and grasp.  Baseline: New to outpt OT ~ No active IP flexion Goal status: IN PROGRESS   4.  Pt will improve left hand functional use from unable to assess to at least Mod I for basic ADLs (ie, brushing her hair, bathing, brushing her teeth and folding laundry) to assist in ability to carry out self-care and higher-level homecare tasks with less difficulty. Baseline: New to outpt OT with family assistance with bra/spanx Goal status: IN Progress   5.  Pt will improve coordination skills in left hand, as seen by better score on 9 hole peg testing to have increased functional ability to carry out fine motor tasks (fasteners, etc.) and more complex, coordinated IADLs (meal prep, sports, etc.).  Baseline: New to outpt OT - unable to complete due to poor IP flexion Goal status: IN Progress  12/18/23 9 Hole Peg test: Right: 24.29 sec; Left: 26.46 sec   6.  Pt will decrease pain at worst from 5/10 to 2/10 or less to have better sleep and occupational participation in daily roles. Baseline: New to outpt OT Goal status: IN PROGRESS  ASSESSMENT:  CLINICAL IMPRESSION:  Patient presents with ongoing limits with functional use of the left thumb following a distal phalanx fracture. She responds well to fluidotherapy for pain management and verbalizes understanding of HEP activities, however still has stiffness of IP joint, with some ongoing edema. She will continue to benefit from skilled outpatient OT to improve functional use of L hand for ADLs and IADLs.   PERFORMANCE DEFICITS: in functional skills including ADLs, IADLs, coordination, dexterity, proprioception, sensation, edema, tone, ROM, strength, pain, fascial restrictions, muscle spasms, flexibility, Fine motor control, Gross motor control, mobility, balance, body mechanics, endurance, decreased knowledge  of precautions, decreased knowledge of use of DME, wound, skin integrity, and UE functional use, cognitive skills including memory, problem solving, and safety awareness, and psychosocial skills including coping strategies, environmental adaptation, and routines and behaviors.   IMPAIRMENTS: are limiting patient from ADLs, IADLs, rest and sleep, leisure, and social participation.   COMORBIDITIES: has co-morbidities such as OA that affects occupational performance. Patient will benefit from skilled OT to address above impairments and improve overall function.  PLAN:  OT FREQUENCY: 1-2x/week   OT DURATION: 10 weeks   PLANNED INTERVENTIONS: 97535 self care/ADL training, 02889 therapeutic exercise, 97530 therapeutic activity, 97140 manual therapy, 97035 ultrasound, 97018 paraffin, 02960 fluidotherapy, 97010 moist heat, 97010 cryotherapy, 97763 Orthotics management and training, 02239 Splinting (initial encounter), scar mobilization, passive range of motion, patient/family education, and DME and/or AE instructions  PLAN FOR NEXT SESSION:  Conduct grip strength Splint check and adjustments PRN Update HEP per [place]  Protocol for thumb fracture Edema control/scar management PRN Begin strengthening ~8 week mark (12/26/23) postoperatively when cleared by Dr Erwin.   Per [place]  Hand Protocol for post op MCP pp [phone] and distal phalanx fracture pg 274  Clarita LITTIE Pride, OT 12/24/2023, 5:59 PM

## 2023-12-30 ENCOUNTER — Ambulatory Visit: Admitting: Orthopedic Surgery

## 2023-12-30 ENCOUNTER — Other Ambulatory Visit

## 2023-12-30 DIAGNOSIS — S62212A Bennett's fracture, left hand, initial encounter for closed fracture: Secondary | ICD-10-CM

## 2023-12-30 NOTE — Progress Notes (Signed)
   Misty [name] - 63 y.o. female MRN [id number]  Date of birth: 13-Dec-1960  Office Visit Note: Visit Date: 12/30/2023 PCP: Geofm Glade PARAS, MD Referred by: Geofm Glade PARAS, MD  Subjective:  HPI: Misty [name] is a 63 y.o. female who presents today for follow up 8 weeks status post left thumb metacarpal fracture and distal phalanx fracture closed reduction with percutaneous pinning.  She is doing well overall, pain is controlled, has been working with occupational therapy as instructed.  Pertinent ROS were reviewed with the patient and found to be negative unless otherwise specified above in HPI.   Assessment & Plan: Visit Diagnoses:  1. Closed Bennett's fracture of left thumb, initial encounter     Plan: She continues to do well postoperatively.  X-rays today demonstrate stable appearance of the thumb metacarpal and distal phalanx fractures with appropriate interval healing.  Can begin to wean from the orthosis as tolerated and resume activities as tolerated.  Follow-up with myself in approximate 4 weeks.  Follow-up: No follow-ups on file.   Meds & Orders: No orders of the defined types were placed in this encounter.   Orders Placed This Encounter  Procedures   XR Hand Complete Left     Procedures: No procedures performed       Objective:   Vital Signs: There were no vitals taken for this visit.  Ortho Exam Left thumb with minimal tenderness diffusely, able to perform thumb opposition to the small finger PIP, thumb circumduction without significant pain, IP range of motion 0-45, sensation intact distally, thumb warm well-perfused  Imaging: XR Hand Complete Left Result Date: 12/30/2023 X-rays demonstrate appropriate interval healing of the thumb metacarpal and distal phalanx fractures    Laquon Emel Estela) Tauni Sanks, M.D. [facility], Hand Surgery

## 2023-12-31 ENCOUNTER — Ambulatory Visit: Payer: Self-pay | Admitting: Occupational Therapy

## 2023-12-31 DIAGNOSIS — M25649 Stiffness of unspecified hand, not elsewhere classified: Secondary | ICD-10-CM | POA: Diagnosis not present

## 2023-12-31 DIAGNOSIS — R600 Localized edema: Secondary | ICD-10-CM

## 2023-12-31 DIAGNOSIS — R208 Other disturbances of skin sensation: Secondary | ICD-10-CM

## 2023-12-31 DIAGNOSIS — M6281 Muscle weakness (generalized): Secondary | ICD-10-CM

## 2023-12-31 DIAGNOSIS — R29898 Other symptoms and signs involving the musculoskeletal system: Secondary | ICD-10-CM

## 2023-12-31 DIAGNOSIS — R278 Other lack of coordination: Secondary | ICD-10-CM

## 2023-12-31 DIAGNOSIS — M25542 Pain in joints of left hand: Secondary | ICD-10-CM

## 2023-12-31 NOTE — Therapy (Signed)
 OUTPATIENT OCCUPATIONAL THERAPY ORTHO TREATMENT  Patient Name: Misty [name] MRN: [id number] DOB:January 04, 1961, 63 y.o., female Today's Date: 12/31/2023  PCP: Geofm Glade PARAS, MD REFERRING PROVIDER: Arlinda Buster, MD  END OF SESSION:  OT End of Session - 12/31/23 1534     Visit Number 6    Number of Visits 16    Date for Recertification  02/07/24    Authorization Type [employer] Dual 2025 Covered 100 %    Authorization Time Period NO AUTH REQUIRED VL: [place]    Progress Note Due on Visit 10    OT Start Time 1535    OT Stop Time 1628    OT Time Calculation (min) 53 min    Equipment Utilized During Treatment Fluidotherapy; FM objects    Activity Tolerance Patient tolerated treatment well    Behavior During Therapy WFL for tasks assessed/performed          Past Medical History:  Diagnosis Date   Allergy    Arthritis    Back pain    Colon cancer (HCC)    Depression    Diarrhea    GERD (gastroesophageal reflux disease)    Hypertension    Insomnia    MS (multiple sclerosis) 2000   pt says that she doesn't have it   Other hammer toe (acquired) 03/31/2013   Pneumonia    hx of   Pre-diabetes    Umbilical hernia    Past Surgical History:  Procedure Laterality Date   ABDOMINAL HYSTERECTOMY     ANTERIOR CERVICAL DECOMP/DISCECTOMY FUSION N/A 03/01/2014   Procedure: ANTERIOR CERVICAL DECOMPRESSION/DISCECTOMY FUSION 1 LEVEL;  Surgeon: Victory DELENA Gunnels, MD;  Location: [facility];  Service: Neurosurgery;  Laterality: N/A;  ANTERIOR CERVICAL DECOMPRESSION/DISCECTOMY FUSION 1 LEVEL   BUNIONECTOMY Right 10/14/2014   @[facility]    CESAREAN SECTION     COLON SURGERY     COLONOSCOPY     FOOT SURGERY Right    GASTRIC BYPASS     Hammer Toe Repair Right 10/14/2014   RT #2, @[facility]    INCISION AND DRAINAGE HIP Left 06/02/2020   Procedure: IRRIGATION AND DEBRIDEMENT LEFT HIP, POSSIBLE HEAD BALL AND LINER EXCHANGE;  Surgeon: Fidel Rogue, MD;  Location: [facility];  Service: Orthopedics;  Laterality: Left;     KNEE ARTHROPLASTY Right 06/14/2022   Procedure: COMPUTER ASSISTED TOTAL KNEE ARTHROPLASTY;  Surgeon: Fidel Rogue, MD;  Location: [facility];  Service: Orthopedics;  Laterality: Right;  160   TENOTOMY Right 10/14/2014   RT #3, @[facility]    TOTAL HIP ARTHROPLASTY Left 04/28/2020   Procedure: TOTAL HIP ARTHROPLASTY ANTERIOR APPROACH;  Surgeon: Fidel Rogue, MD;  Location: [facility];  Service: Orthopedics;  Laterality: Left;  3E bed   TOTAL HIP ARTHROPLASTY Right 01/25/2021   Procedure: TOTAL HIP ARTHROPLASTY ANTERIOR APPROACH;  Surgeon: Fidel Rogue, MD;  Location: [facility];  Service: Orthopedics;  Laterality: Right;   Patient Active Problem List   Diagnosis Date Noted   Obesity (BMI 30.0-34.9) 10/17/2023   S/P TKR (total knee replacement), right  04/2022 10/17/2023   GERD (gastroesophageal reflux disease) 04/19/2023   Rib pain on right side 02/06/2023   Iron  deficiency anemia 10/16/2021   CKD (chronic kidney disease) stage 3, GFR 30-59 ml/min (HCC) 10/15/2021   RUQ pain 10/12/2021   S/P hip replacement, bilateral 05/30/2021   Pain due to onychomycosis of toenail of right foot 04/25/2021   Osteoarthritis of right hip 01/25/2021   Avascular necrosis of femoral head (HCC) 01/25/2021   Toxic neuropathy 07/08/2020  Osteoarthritis of left hip 02/19/2020   COVID-19 virus infection 04/18/2019   Restless leg syndrome 03/10/2019   Hyperlipidemia 11/27/2018   Anemia 11/25/2018   Nausea 09/17/2018   Right shoulder pain 03/03/2018   Pancreatitis 02/22/2018   Alcohol  abuse 02/22/2018   Cholelithiasis 02/22/2018   Adhesive capsulitis of right shoulder 01/20/2018   Ventral hernia 09/16/2017   Prediabetes 04/16/2017   Spinal stenosis of lumbar region 11/29/2015   Positive urine drug screen 11/16/2015   Umbilical hernia 10/12/2015   Sleeping difficulties 07/15/2015   Bunion, right 06/20/2015   B12 deficiency 03/30/2015   Hammer toe of left foot 02/16/2015   Fibrosis of skin of lower extremity  02/16/2015   Cervical spondylosis with radiculopathy 03/01/2014   Hammer toe of right foot 01/26/2014   Benign intracranial hypertension 06/18/2013   Porokeratosis 03/31/2013   Vitamin D  deficiency 04/18/2010   MEDIAL MENISCUS TEAR, LEFT 06/30/2008   Current tear knee, medial meniscus 06/30/2008   BARRETTS ESOPHAGUS 06/28/2008   Osteoarthritis of right knee 06/28/2008   HYPERTENSION, BENIGN ESSENTIAL 05/24/2008   Irritable bowel syndrome 05/24/2008   BARIATRIC SURGERY STATUS 03/19/2006   History of malignant neoplasm of large intestine 03/19/1997    ONSET DATE: Referral: 10/29/2023  Surgery 10/31/23; Fall 10/19/33  REFERRING DIAG:   D37.477J (ICD-10-CM) - Closed displaced fracture of distal phalanx of left thumb, initial encounter Needs OT to start 4 weeks s/p left thumb closed reduction with perc pinning on 10/31/23  THERAPY DIAG:  Stiffness of thumb joint  Pain in joint of left hand  Muscle weakness (generalized)  Other lack of coordination  Localized edema  Other disturbances of skin sensation  Other symptoms and signs involving the musculoskeletal system  Rationale for Evaluation and Treatment: Rehabilitation  SUBJECTIVE:   SUBJECTIVE STATEMENT:  Pt reported she was busy with food orders for [employer] (homecoming) this weekend) but she used a food processor to help ie) cut cabbage for [employer] and she rested on Sunday   Pt saw MD yesterday. MD note as follows Plan: She continues to do well postoperatively.  X-rays today demonstrate stable appearance of the thumb metacarpal and distal phalanx fractures with appropriate interval healing.  Can begin to wean from the orthosis as tolerated and resume activities as tolerated.  Follow-up with myself in approximate 4 weeks.   Pt also confirmed MD reiterated education re: hand protection with water  activities to minimize resistance and pressure, which may exacerbate discomfort in the thumb.  Pt accompanied by: Self & grant  granddaughter  PERTINENT HISTORY:  Patient is a 63 year old female with a history of MS (pt reports she took meds ~ 10 years but then stopped taking it and has had falls etc since then), hypertension, degenerative disc disease on chronic pain medication with Xtampza  and oxycodone  10 mg daily who presented to the hospital 10/21/23 with pain on the left side of her body after a fall the night before ~9 PM.   Xray results: IMPRESSION: 1. Acute, mildly displaced intra-articular fracture involving the base of the 1st metacarpal with associated mild radial subluxation at the 1st carpometacarpal joint. 2. Mildly displaced intra-articular fracture of the base of the 1st distal phalanx.  PMHx: HTN, IBS, GERD, neuropathy, OA with B hip and R knee replacements, prediabetic, restless leg syndrome, L ring finger injury x 1 year  PRECAUTIONS: Fall risk. MD requesting thumb spica with IP immobilized and tip protection recommendation per [place]  Hand Protocol for post op distal phalanx fracture pg 274; see also post  op MCP repair pp [phone]  RED FLAGS: None   WEIGHT BEARING RESTRICTIONS: Yes L hand  PAIN:  Yes: NPRS scale: 3 Pain location: L thumb all the way down to the radial side of the L wrist Pain description: throbbing Aggravating factors: moving it, helping G-grandchild get in her car seat Relieving factors:  HEAT and pain meds 2x today 4:30 AM and 2 PM  FALLS: Has patient fallen in last 6 months? Yes. Number of falls 3 - 1 with injury; and recently fell out of the truck trying to close the door with opposite hand but was able to ease herself to the ground - Reports she had gone to grocery store but wasn't supposed to drive.  LIVING ENVIRONMENT: Lives with: lives alone and lives with an adult companion - Pt lives/stays between 2 locations ie) her home and her friends home but his place has stairs, daughters visits regularly - oldest daughter is an Charity fundraiser Lives in: House/apartment - was moving  between 2 homes (stairs at friends place) Stairs: 5 steps in garage at friends place - this was where she fell Has following equipment at home: Single point cane, Environmental consultant - 4 wheeled, shower chair, Shower bench, bed side commode, Grab bars, and uses shower chair, uses cane - mostly with longer distances  PLOF: Independent, driving, had foot surgery and was in therapy for this when she fell.  PATIENT GOALS: Wants to use her hand better - put her bra and spanx on herself  NEXT MD VISIT: 01/27/24  OBJECTIVE:  Note: Objective measures were completed at Evaluation unless otherwise noted.  HAND DOMINANCE: Right  ADLs: Overall ADLs: Gets help with tight fitting clothes Transfers/ambulation related to ADLs: Mod Ind Eating: gets help cutting food - depends on what it is Grooming: Help with hair Upper body dressing: Help with bra/spanx Lower body dressing: Pt pick socks and shoes she can manage herself Toileting: Ind with some difficulty reaching at times Bathing: Pt's daughter has been helping her get a good shower Tub shower transfers: Mod Ind with shower chair Equipment: Shower seat with back, Grab bars, and bed side commode  FUNCTIONAL OUTCOME MEASURES: Quick Dash: TBD ~ Educated determination by OTR > 60%   UPPER EXTREMITY ROM:     Active ROM Right eval Left eval  Shoulder flexion [facility]  Shoulder abduction    Shoulder adduction    Shoulder extension    Shoulder internal rotation    Shoulder external rotation    Elbow flexion [facility] WFL  Elbow extension    Wrist flexion 60 40  Wrist extension 70 40  Wrist ulnar deviation  Limited  Wrist radial deviation  Limited  Wrist pronation  WFL  Wrist supination  Limited   (Blank rows = not tested)  Active ROM Right eval Left eval  Thumb MCP (0-60)  55  Thumb IP (0-80)  0  Thumb Radial abd/add (0-55)     Thumb Palmar abd/add (0-45)     Thumb Opposition to Small Finger     Index MCP (0-90)   55  Index PIP (0-100)   65  Index  DIP (0-70)    37  Long MCP (0-90)   55   Long PIP (0-100)   70   Long DIP (0-70)   37   Ring MCP (0-90)   55   Ring PIP (0-100)   78   Ring DIP (0-70)    37  Little MCP (0-90)    55  Little  PIP (0-100)    78  Little DIP (0-70)   37   (Blank rows = not tested)   UPPER EXTREMITY MMT:     MMT Right eval Left eval  Shoulder flexion 4- 4-  Shoulder abduction    Shoulder adduction    Shoulder extension    Shoulder internal rotation    Shoulder external rotation    Middle trapezius    Lower trapezius    Elbow flexion 4 4-  Elbow extension 4 4-  Wrist flexion 4 3-  Wrist extension 4 3-  Wrist ulnar deviation    Wrist radial deviation    Wrist pronation 4 4-  Wrist supination 4 2  (Blank rows = not tested)  HAND FUNCTION: Grip strength: Right: TBA lbs; Left: TBA lbs  12/31/23: Right 40.7, 66.3, 71.2 Average 59.4 lbs Left 14.5, 35.9, 41.8 Average 30.7 lbs  COORDINATION: 12/18/2023: 9 Hole Peg test: Right: 24.29 sec; Left: 26.46 sec  SENSATION: Not tested  EDEMA: L hand/digits swollen s/p removal of cast today and pins  COGNITION: Overall cognitive status: Within functional limits for tasks assessed  OBSERVATIONS: Pt ambulates with no AE and no obvious loss of balance but with abnormal gait pattern s/p B hip and R knee replacements. The pt appears well kept and was concerned about the dry flaking skin around her hand/digits with education to gentle exfoliate skin but not to apply lotion yet as pin sites are not yet healed s/p removal of pins today.   TODAY'S TREATMENT:      - Therapeutic exercises completed for duration as noted below including:   Pt placed BUE in Fluidotherapy machine with supervised ROM x 13 min. Pt was educated to complete LUE A/PROM to thumb IP along with other AROM (tendon glides with gentle thumb adduction across digits) during modality time to improve ROM and decrease pain/stiffness of affected extremity by use of the machine's massaging action  and thermal properties. Upon removing hand from machine pt reported pain decreased from 5/10 down to a 2-3/10.  Reviewed thumb IP flexion with need for ongoing edema control noted and thumb sleeve provided.  Reviewed and practiced IP PROM with proximal phalanx stabilized.   - Self Care education and training completed for duration as noted below including: Reviewed joint protection principles and gradual decrease in splint use.  Pt reports not using splint at night despite recommendation as she sleeps with her hand on a pillow and hardly moves it.  Pt declined shortening thumb spica splint due to desire for continued protection of thumb tip but education provided on removing splint, providing tactile input to decreased sensitivity (present also before injury).  Education provided on weaning out of the short opponens orthosis during the day gradually over 2-4 weeks ie) leave the orthosis off for light ADLs 3-4 times a day (<= 1 hour) is encouraged. Patient encouraged to wear splint at night for 8-10 weeks and gradually eliminate by 12 weeks postop.  - Therapeutic activities completed for duration as noted below including:  Coordination Exercise/Activity handout with images provided for various activities to work on B UE finger ROM, dexterity and isolated movements with demonstration and practice, as well as modification, hand over hand guidance and cues throughout to improve technique, digital isolation and ease of performing task.  Tasks included:  Pick up coins, dominoes, buttons, marbles, dried beans/pasta of different sizes ... To place in containers To stack - with guidance to work on include/isolate specific fingers. To pick up items one  at a time until patient got 5+ in their hand and then move item from palm to fingertips to release ie) Finger-to-palm then palm-to-finger translation of small items - Options to vary difficulty include using a washcloth under items like coins or using larger items  (checkers vs coins or blocks/dominos vs dice) for increased ease of picking up items.  Shuffling, Flipping and dealing cards 1 at a time. -- Setup patient to work on sorting cards, focusing on using index finger with thumb to flip cards or holding deck of cards in palm of hand and push off 1 card at a time from the top of the deck using only thumb    Rotate golf balls (clockwise and counter-clockwise) with forearm pronated and balls on table or supinated and balls in hand.   Twirl pen/cil between fingers. - Encouragement to isolate fingers individually and twirl (rotation) or flipping and shift up and down the pen (translation) to get it in position for writing or erasing.    Tear a piece of paper towel and roll it into small balls with fingertips ie) straw wrapping when eating out.    Patient is encouraged to take breaks, relax arm/shoulder by supporting forearm, minimize compensatory motions and a try different activities throughout the day/week including games like Londa (for the dice), card games, Connect 4 etc.   Patient benefited from extra time, verbal/tactile cues, and modeling of task to allow time for processing of verbal instructions and improve motor planning of unfamiliar movements.     PATIENT EDUCATION: Education details: Psychiatrist Person educated: Patient Education method: Explanation, Demonstration, Verbal cues, and Handouts Education comprehension: verbalized understanding, returned demonstration, verbal cues required, tactile cues required, and needs further education  HOME EXERCISE PROGRAM: 11/28/23: Tendon Gliding Exercises 12/05/23: AROM/AAROM exercises 12/31/23: Coordination Activities  GOALS: Goals reviewed with patient? Yes  SHORT TERM GOALS: Target date: 12/20/23  Pt will obtain protective, custom forearm based thumb spica splint/orthotic. Baseline: New to outpt OT Goal status: MET   2.  Pt will demo/state understanding of initial HEP to  improve pain levels and prerequisite motion for functional use of left hand/thumb. Baseline: New to outpt OT. Tendon gliding ROM provided at eval. Goal status: MET     LONG TERM GOALS: Target date: 02/07/24   Patient will demonstrate at least 16% improvement with quick Dash score (reporting <50% disability or less) indicating improved functional use of affected extremity.  Baseline: New to outpt OT ~ >63% Goal status: IN PROGRESS   2.  Pt will improve grip strength in left hand from unable to assess to at least 40 lbs for functional use at home and in IADLs. Baseline: New to outpt OT ~ unable to assess Goal status: Revised  12/31/23 Left 14.5, 35.9, 41.8 Average 30.7 lbs   3.  Pt will improve A/ROM in left thumb from opposition to tip of index finger on left to at least opposition to PIP (or greater) of left small finger, to have functional motion for tasks like ADLs, reach, and grasp.  Baseline: New to outpt OT ~ No active IP flexion Goal status: IN PROGRESS   4.  Pt will improve left hand functional use from unable to assess to at least Mod I for basic ADLs (ie, brushing her hair, bathing, brushing her teeth and folding laundry) to assist in ability to carry out self-care and higher-level homecare tasks with less difficulty. Baseline: New to outpt OT with family assistance with bra/spanx Goal status: IN Progress  5.  Pt will improve coordination skills in left hand, as seen by better score on 9 hole peg testing to have increased functional ability to carry out fine motor tasks (fasteners, etc.) and more complex, coordinated IADLs (meal prep, sports, etc.).  Baseline: New to outpt OT - unable to complete due to poor IP flexion Goal status: IN Progress  12/18/23 9 Hole Peg test: Right: 24.29 sec; Left: 26.46 sec   6.  Pt will decrease pain at worst from 5/10 to 2/10 or less to have better sleep and occupational participation in daily roles. Baseline: New to outpt OT Goal status: IN  PROGRESS  ASSESSMENT:  CLINICAL IMPRESSION:  Patient presents with ongoing limits with functional use of the left thumb following a distal phalanx fracture. She responds well to fluidotherapy for pain management and verbalizes understanding of HEP activities, however still has stiffness of IP joint, with some ongoing edema. Coordination activities provided today as  simple functional tasks to increased use of LUE.  She will continue to benefit from skilled outpatient OT to improve functional use of L hand for ADLs and IADLs.   PERFORMANCE DEFICITS: in functional skills including ADLs, IADLs, coordination, dexterity, proprioception, sensation, edema, tone, ROM, strength, pain, fascial restrictions, muscle spasms, flexibility, Fine motor control, Gross motor control, mobility, balance, body mechanics, endurance, decreased knowledge of precautions, decreased knowledge of use of DME, wound, skin integrity, and UE functional use, cognitive skills including memory, problem solving, and safety awareness, and psychosocial skills including coping strategies, environmental adaptation, and routines and behaviors.   IMPAIRMENTS: are limiting patient from ADLs, IADLs, rest and sleep, leisure, and social participation.   COMORBIDITIES: has co-morbidities such as OA that affects occupational performance. Patient will benefit from skilled OT to address above impairments and improve overall function.  PLAN:  OT FREQUENCY: 1-2x/week   OT DURATION: 10 weeks   PLANNED INTERVENTIONS: 97535 self care/ADL training, 02889 therapeutic exercise, 97530 therapeutic activity, 97140 manual therapy, 97035 ultrasound, 97018 paraffin, 02960 fluidotherapy, 97010 moist heat, 97010 cryotherapy, 97763 Orthotics management and training, 02239 Splinting (initial encounter), scar mobilization, passive range of motion, patient/family education, and DME and/or AE instructions  PLAN FOR NEXT SESSION:   Splint check and adjustments -   Update HEP per [place]  Protocol for thumb fracture Edema control/scar management PRN Assess/progress strengthening ~8 week mark  Per [place]  Hand Protocol for post op MCP pp [phone] and distal phalanx fracture pg 274  Clarita LITTIE Pride, OT 12/31/2023, 6:47 PM

## 2024-01-05 ENCOUNTER — Other Ambulatory Visit: Payer: Self-pay | Admitting: Internal Medicine

## 2024-01-07 ENCOUNTER — Ambulatory Visit: Admitting: Podiatry

## 2024-01-07 ENCOUNTER — Encounter: Payer: Self-pay | Admitting: Occupational Therapy

## 2024-01-09 ENCOUNTER — Ambulatory Visit: Admitting: Occupational Therapy

## 2024-01-10 ENCOUNTER — Ambulatory Visit (INDEPENDENT_AMBULATORY_CARE_PROVIDER_SITE_OTHER): Admitting: Podiatry

## 2024-01-10 ENCOUNTER — Encounter: Payer: Self-pay | Admitting: Podiatry

## 2024-01-10 ENCOUNTER — Ambulatory Visit: Admitting: Podiatry

## 2024-01-10 ENCOUNTER — Other Ambulatory Visit: Payer: Self-pay | Admitting: Podiatry

## 2024-01-10 ENCOUNTER — Ambulatory Visit

## 2024-01-10 DIAGNOSIS — M79675 Pain in left toe(s): Secondary | ICD-10-CM | POA: Diagnosis not present

## 2024-01-10 DIAGNOSIS — M65972 Unspecified synovitis and tenosynovitis, left ankle and foot: Secondary | ICD-10-CM

## 2024-01-10 NOTE — Progress Notes (Signed)
 Subjective:  Patient ID: Misty [name], female    DOB: [birth year],  MRN: [id number]  Chief Complaint  Patient presents with   Nail Problem    RFc    63 y.o. female presents with the above complaint.  Patient presents with left second metatarsophalangeal joint pain.  Hurts with ambulation and shoe pressure has not seen MRIs prior to seeing me.  Denies any other acute complaints would like to discuss steroid injection.   Review of Systems: Negative except as noted in the HPI. Denies N/V/F/Ch.  Past Medical History:  Diagnosis Date   Allergy    Arthritis    Back pain    Colon cancer (HCC)    Depression    Diarrhea    GERD (gastroesophageal reflux disease)    Hypertension    Insomnia    MS (multiple sclerosis) 2000   pt says that she doesn't have it   Other hammer toe (acquired) 03/31/2013   Pneumonia    hx of   Pre-diabetes    Umbilical hernia     Current Outpatient Medications:    albuterol  (PROAIR  HFA) 108 (90 Base) MCG/ACT inhaler, Inhale 1-2 puffs into the lungs every 6 (six) hours as needed for wheezing or shortness of breath., Disp: 1 each, Rfl: 5   atorvastatin  (LIPITOR) 20 MG tablet, Take 1 tablet by mouth once daily, Disp: 90 tablet, Rfl: 0   Cholecalciferol  (VITAMIN D3) 50 MCG (2000 UT) TABS, TAKE 1 TABLET BY MOUTH EVERY DAY, Disp: 90 tablet, Rfl: 3   cyanocobalamin  (,VITAMIN B-12,) 1000 MCG/ML injection, Inject 1,000 mcg into the muscle every 30 (thirty) days., Disp: , Rfl:    diclofenac  Sodium (VOLTAREN ) 1 % GEL, Apply 2 g topically 4 (four) times daily. APPLY 2 GRAMS TOPICALLY 4 TIMES DAILY, Disp: 100 g, Rfl: 5   ferrous gluconate  (FERGON) 324 MG tablet, Take 1 tablet (324 mg total) by mouth daily., Disp: , Rfl: 3   gabapentin  (NEURONTIN ) 300 MG capsule, TAKE 3 CAPSULES BY MOUTH THREE TIMES DAILY, Disp: 270 capsule, Rfl: 0   lisinopril  (ZESTRIL ) 20 MG tablet, Take 1 tablet by mouth once daily, Disp: 90 tablet, Rfl: 0   MELATONIN PO, Take 1 tablet by mouth as  needed (sleep)., Disp: , Rfl:    methocarbamol  (ROBAXIN ) 500 MG tablet, TAKE 1 TABLET BY MOUTH EVERY 8 HOURS AS NEEDED FOR MUSCLE SPASM, Disp: 270 tablet, Rfl: 1   naloxone  (NARCAN ) nasal spray 4 mg/0.1 mL, Place 1 spray into the nose as needed (opioid overdose)., Disp: , Rfl:    omeprazole  (PRILOSEC) 20 MG capsule, Take 1 capsule (20 mg total) by mouth daily. Take 1 capsule by mouth 30 minutes prior to a meal once a day, Disp: , Rfl:    ondansetron  (ZOFRAN ) 4 MG tablet, Take 1 tablet (4 mg total) by mouth every 8 (eight) hours as needed for nausea or vomiting., Disp: 30 tablet, Rfl: 8   oxyCODONE  (ROXICODONE ) 5 MG immediate release tablet, Take 1-2 tablets as needed every 6 hours, Disp: 20 tablet, Rfl: 0   oxyCODONE -acetaminophen  (PERCOCET) 10-325 MG tablet, Take 1 tablet by mouth 4 (four) times daily as needed., Disp: , Rfl:    OZEMPIC , 2 MG/DOSE, 8 MG/3ML SOPN, INJECT 2MG  SUBCUTANEOUSLY ONCE A WEEK, Disp: 9 mL, Rfl: 0   Syringe/Needle, Disp, (SYRINGE 3CC/25GX1) 25G X 1 3 ML MISC, Use for monthly B12 injections, Disp: 3 each, Rfl: 3   Turmeric (QC TUMERIC COMPLEX PO), Take 1 tablet by mouth daily., Disp: ,  Rfl:    XTAMPZA  ER 18 MG C12A, Take 18 mg by mouth every 12 (twelve) hours., Disp: , Rfl:    ALPRAZolam  (XANAX ) 0.5 MG tablet, Take 1 tablet (0.5 mg total) by mouth at bedtime as needed for anxiety. (Patient not taking: Reported on 11/28/2023), Disp: 20 tablet, Rfl: 0   MAXITROL 3.5-10000-0.1 OINT, SMARTSIG:1 sparingly In Eye(s) Twice Daily (Patient not taking: Reported on 11/28/2023), Disp: , Rfl:   Social History   Tobacco Use  Smoking Status Former   Current packs/day: 0.00   Average packs/day: 0.3 packs/day for 20.0 years (5.0 ttl pk-yrs)   Types: Cigarettes   Start date: 09/22/1998   Quit date: 09/22/2018   Years since quitting: 5.3  Smokeless Tobacco Never  Tobacco Comments   states she quit 3 months ago    Allergies  Allergen Reactions   Phenergan  [Promethazine  Hcl] Anxiety    Objective:  There were no vitals filed for this visit. There is no height or weight on file to calculate BMI. Constitutional Well developed. Well nourished.  Vascular Dorsalis pedis pulses palpable bilaterally. Posterior tibial pulses palpable bilaterally. Capillary refill normal to all digits.  No cyanosis or clubbing noted. Pedal hair growth normal.  Neurologic Normal speech. Oriented to person, place, and time. Epicritic sensation to light touch grossly present bilaterally.  Dermatologic Nails well groomed and normal in appearance. No open wounds. No skin lesions.  Orthopedic: Left second MTP capsulitis.  Pain with range of motion of the joint no deep intra-articular pain noted no crepitus noted.   Radiographs: None Assessment:   1. Pain of toe of left foot    Plan:  Patient was evaluated and treated and all questions answered.  Left second MTP synovitis - All questions and concerns were discussed with the patient in extensive detail given the amount of pain that she is experiencing should benefit from a steroid injection to help decrease inflammatory, surgical pain.  Patient agrees with plan like to proceed with steroid injection -A steroid injection was performed at left second MTP using 1% plain Lidocaine  and 10 mg of Kenalog . This was well tolerated.   No follow-ups on file.

## 2024-01-14 ENCOUNTER — Ambulatory Visit: Payer: Self-pay | Admitting: Occupational Therapy

## 2024-01-14 DIAGNOSIS — M25649 Stiffness of unspecified hand, not elsewhere classified: Secondary | ICD-10-CM | POA: Diagnosis not present

## 2024-01-14 DIAGNOSIS — M6281 Muscle weakness (generalized): Secondary | ICD-10-CM

## 2024-01-14 DIAGNOSIS — M25542 Pain in joints of left hand: Secondary | ICD-10-CM

## 2024-01-14 DIAGNOSIS — R278 Other lack of coordination: Secondary | ICD-10-CM

## 2024-01-14 DIAGNOSIS — R29898 Other symptoms and signs involving the musculoskeletal system: Secondary | ICD-10-CM

## 2024-01-14 NOTE — Therapy (Signed)
 OUTPATIENT OCCUPATIONAL THERAPY ORTHO TREATMENT  Patient Name: Misty [name] MRN: [id number] DOB:[birth year], 63 y.o., female Today's Date: 01/14/2024  PCP: Geofm Glade PARAS, MD REFERRING PROVIDER: Arlinda Buster, MD  END OF SESSION:  OT End of Session - 01/14/24 1455     Visit Number 7    Number of Visits 16    Date for Recertification  02/07/24    Authorization Type [employer] Dual 2025 Covered 100 %    Authorization Time Period NO AUTH REQUIRED VL: [place]    Progress Note Due on Visit 10    OT Start Time 1455    OT Stop Time 1533    OT Time Calculation (min) 38 min    Equipment Utilized During Treatment Pink putty    Activity Tolerance Patient tolerated treatment well    Behavior During Therapy WFL for tasks assessed/performed          Past Medical History:  Diagnosis Date   Allergy    Arthritis    Back pain    Colon cancer (HCC)    Depression    Diarrhea    GERD (gastroesophageal reflux disease)    Hypertension    Insomnia    MS (multiple sclerosis) 2000   pt says that she doesn't have it   Other hammer toe (acquired) 03/31/2013   Pneumonia    hx of   Pre-diabetes    Umbilical hernia    Past Surgical History:  Procedure Laterality Date   ABDOMINAL HYSTERECTOMY     ANTERIOR CERVICAL DECOMP/DISCECTOMY FUSION N/A 03/01/2014   Procedure: ANTERIOR CERVICAL DECOMPRESSION/DISCECTOMY FUSION 1 LEVEL;  Surgeon: Victory DELENA Gunnels, MD;  Location: [facility];  Service: Neurosurgery;  Laterality: N/A;  ANTERIOR CERVICAL DECOMPRESSION/DISCECTOMY FUSION 1 LEVEL   BUNIONECTOMY Right 10/14/2014   @[facility]    CESAREAN SECTION     COLON SURGERY     COLONOSCOPY     FOOT SURGERY Right    GASTRIC BYPASS     Hammer Toe Repair Right 10/14/2014   RT #2, @[facility]    INCISION AND DRAINAGE HIP Left 06/02/2020   Procedure: IRRIGATION AND DEBRIDEMENT LEFT HIP, POSSIBLE HEAD BALL AND LINER EXCHANGE;  Surgeon: Fidel Rogue, MD;  Location: [facility];  Service: Orthopedics;  Laterality: Left;    KNEE  ARTHROPLASTY Right 06/14/2022   Procedure: COMPUTER ASSISTED TOTAL KNEE ARTHROPLASTY;  Surgeon: Fidel Rogue, MD;  Location: [facility];  Service: Orthopedics;  Laterality: Right;  160   TENOTOMY Right 10/14/2014   RT #3, @[facility]    TOTAL HIP ARTHROPLASTY Left 04/28/2020   Procedure: TOTAL HIP ARTHROPLASTY ANTERIOR APPROACH;  Surgeon: Fidel Rogue, MD;  Location: [facility];  Service: Orthopedics;  Laterality: Left;  3E bed   TOTAL HIP ARTHROPLASTY Right 01/25/2021   Procedure: TOTAL HIP ARTHROPLASTY ANTERIOR APPROACH;  Surgeon: Fidel Rogue, MD;  Location: [facility];  Service: Orthopedics;  Laterality: Right;   Patient Active Problem List   Diagnosis Date Noted   Obesity (BMI 30.0-34.9) 10/17/2023   S/P TKR (total knee replacement), right  04/2022 10/17/2023   GERD (gastroesophageal reflux disease) 04/19/2023   Rib pain on right side 02/06/2023   Iron  deficiency anemia 10/16/2021   CKD (chronic kidney disease) stage 3, GFR 30-59 ml/min (HCC) 10/15/2021   RUQ pain 10/12/2021   S/P hip replacement, bilateral 05/30/2021   Pain due to onychomycosis of toenail of right foot 04/25/2021   Osteoarthritis of right hip 01/25/2021   Avascular necrosis of femoral head (HCC) 01/25/2021   Toxic neuropathy 07/08/2020  Osteoarthritis of left hip 02/19/2020   COVID-19 virus infection 04/18/2019   Restless leg syndrome 03/10/2019   Hyperlipidemia 11/27/2018   Anemia 11/25/2018   Nausea 09/17/2018   Right shoulder pain 03/03/2018   Pancreatitis 02/22/2018   Alcohol  abuse 02/22/2018   Cholelithiasis 02/22/2018   Adhesive capsulitis of right shoulder 01/20/2018   Ventral hernia 09/16/2017   Prediabetes 04/16/2017   Spinal stenosis of lumbar region 11/29/2015   Positive urine drug screen 11/16/2015   Umbilical hernia 10/12/2015   Sleeping difficulties 07/15/2015   Bunion, right 06/20/2015   B12 deficiency 03/30/2015   Hammer toe of left foot 02/16/2015   Fibrosis of skin of lower extremity 02/16/2015    Cervical spondylosis with radiculopathy 03/01/2014   Hammer toe of right foot 01/26/2014   Benign intracranial hypertension 06/18/2013   Porokeratosis 03/31/2013   Vitamin D  deficiency 04/18/2010   MEDIAL MENISCUS TEAR, LEFT 06/30/2008   Current tear knee, medial meniscus 06/30/2008   BARRETTS ESOPHAGUS 06/28/2008   Osteoarthritis of right knee 06/28/2008   HYPERTENSION, BENIGN ESSENTIAL 05/24/2008   Irritable bowel syndrome 05/24/2008   BARIATRIC SURGERY STATUS 03/19/2006   History of malignant neoplasm of large intestine 03/19/1997    ONSET DATE: Referral: 10/29/2023  Surgery 10/31/23; Fall 10/19/33  REFERRING DIAG:   D37.477J (ICD-10-CM) - Closed displaced fracture of distal phalanx of left thumb, initial encounter Needs OT to start 4 weeks s/p left thumb closed reduction with perc pinning on 10/31/23  THERAPY DIAG:  Muscle weakness (generalized)  Stiffness of thumb joint  Pain in joint of left hand  Other lack of coordination  Other symptoms and signs involving the musculoskeletal system  Rationale for Evaluation and Treatment: Rehabilitation  SUBJECTIVE:   SUBJECTIVE STATEMENT:  Pt reports she missed therapy last week due to caring for her mother.  She did wear her brace on and off per MD recommendations and reports it feels better with the brace on.  She reports that the hardest thing to do is squeezing/flexing her fingers around objects.    Pt reports her pain is better today than yesterday due to getting cold at the pool yesterday ie) only tolerated 30 minutes and then had to get out.  She used the heat pad at home to help the pain   Pt accompanied by: Self   PERTINENT HISTORY:  Patient is a 63 year old female with a history of MS (pt reports she took meds ~ 10 years but then stopped taking it and has had falls etc since then), hypertension, degenerative disc disease on chronic pain medication with Xtampza  and oxycodone  10 mg daily who presented to the hospital  10/21/23 with pain on the left side of her body after a fall the night before ~9 PM.   Xray results: IMPRESSION: 1. Acute, mildly displaced intra-articular fracture involving the base of the 1st metacarpal with associated mild radial subluxation at the 1st carpometacarpal joint. 2. Mildly displaced intra-articular fracture of the base of the 1st distal phalanx.  PMHx: HTN, IBS, GERD, neuropathy, OA with B hip and R knee replacements, prediabetic, restless leg syndrome, L ring finger injury x 1 year  Pt saw MD 12/30/23. MD note as follows Plan: She continues to do well postoperatively.  X-rays today demonstrate stable appearance of the thumb metacarpal and distal phalanx fractures with appropriate interval healing.  Can begin to wean from the orthosis as tolerated and resume activities as tolerated.  Follow-up with myself in approximate 4 weeks.    PRECAUTIONS: Fall risk. MD  requesting thumb spica with IP immobilized and tip protection recommendation per [place]  Hand Protocol for post op distal phalanx fracture pg 274; see also post op MCP repair pp 258-259  RED FLAGS: None   WEIGHT BEARING RESTRICTIONS: Yes L hand  PAIN:  Yes: NPRS scale: 3 Pain location: L thumb all the way down to the radial side of the L wrist Pain description: throbbing Aggravating factors: moving it, helping her mother last week Relieving factors:  HEAT and pain meds 2x today 4:30 AM and 2 PM  FALLS: Has patient fallen in last 6 months? Yes. Number of falls 3 - 1 with injury; and recently fell out of the truck trying to close the door with opposite hand but was able to ease herself to the ground - Reports she had gone to grocery store but wasn't supposed to drive.  LIVING ENVIRONMENT: Lives with: lives alone and lives with an adult companion - Pt lives/stays between 2 locations ie) her home and her friends home but his place has stairs, daughters visits regularly - oldest daughter is an [employer] Lives in:  House/apartment - was moving between 2 homes (stairs at friends place) Stairs: 5 steps in garage at friends place - this was where she fell Has following equipment at home: Single point cane, Environmental Consultant - 4 wheeled, shower chair, Shower bench, bed side commode, Grab bars, and uses shower chair, uses cane - mostly with longer distances  PLOF: Independent, driving, had foot surgery and was in therapy for this when she fell.  PATIENT GOALS: Wants to use her hand better - put her bra and spanx on herself  NEXT MD VISIT: 01/27/24  OBJECTIVE:  Note: Objective measures were completed at Evaluation unless otherwise noted.  HAND DOMINANCE: Right  ADLs: Overall ADLs: Gets help with tight fitting clothes Transfers/ambulation related to ADLs: Mod Ind Eating: gets help cutting food - depends on what it is Grooming: Help with hair Upper body dressing: Help with bra/spanx Lower body dressing: Pt pick socks and shoes she can manage herself Toileting: Ind with some difficulty reaching at times Bathing: Pt's daughter has been helping her get a good shower Tub shower transfers: Mod Ind with shower chair Equipment: Shower seat with back, Grab bars, and bed side commode  FUNCTIONAL OUTCOME MEASURES: Quick Dash: TBD ~ Educated determination by OTR > 60%   UPPER EXTREMITY ROM:     Active ROM Right eval Left eval  Shoulder flexion [facility]  Shoulder abduction    Shoulder adduction    Shoulder extension    Shoulder internal rotation    Shoulder external rotation    Elbow flexion [facility] WFL  Elbow extension    Wrist flexion 60 40  Wrist extension 70 40  Wrist ulnar deviation  Limited  Wrist radial deviation  Limited  Wrist pronation  WFL  Wrist supination  Limited   (Blank rows = not tested)  Active ROM Right eval Left eval  Thumb MCP (0-60)  55  Thumb IP (0-80)  0  Thumb Radial abd/add (0-55)     Thumb Palmar abd/add (0-45)     Thumb Opposition to Small Finger     Index MCP (0-90)   55   Index PIP (0-100)   65  Index DIP (0-70)    37  Long MCP (0-90)   55   Long PIP (0-100)   70   Long DIP (0-70)   37   Ring MCP (0-90)   55   Ring PIP (  0-100)   78   Ring DIP (0-70)    37  Little MCP (0-90)    55  Little PIP (0-100)    78  Little DIP (0-70)   37   (Blank rows = not tested)   UPPER EXTREMITY MMT:     MMT Right eval Left eval  Shoulder flexion 4- 4-  Shoulder abduction    Shoulder adduction    Shoulder extension    Shoulder internal rotation    Shoulder external rotation    Middle trapezius    Lower trapezius    Elbow flexion 4 4-  Elbow extension 4 4-  Wrist flexion 4 3-  Wrist extension 4 3-  Wrist ulnar deviation    Wrist radial deviation    Wrist pronation 4 4-  Wrist supination 4 2  (Blank rows = not tested)  HAND FUNCTION: Grip strength: Right: TBA lbs; Left: TBA lbs  12/31/23: Right 40.7, 66.3, 71.2 Average 59.4 lbs Left 14.5, 35.9, 41.8 Average 30.7 lbs  01/14/24 Right 63.2, 70.5, 68.5 Left 40.1, 51.5, 52.4 Average Right: 67.4 lbs Left 48 lbs  COORDINATION: 12/18/2023: 9 Hole Peg test: Right: 24.29 sec; Left: 26.46 sec  SENSATION: Not tested  EDEMA: L hand/digits swollen s/p removal of cast today and pins  COGNITION: Overall cognitive status: Within functional limits for tasks assessed  OBSERVATIONS: Pt ambulates with no AE and no obvious loss of balance but with abnormal gait pattern s/p B hip and R knee replacements. The pt appears well kept and was concerned about the dry flaking skin around her hand/digits with education to gentle exfoliate skin but not to apply lotion yet as pin sites are not yet healed s/p removal of pins today.   TODAY'S TREATMENT:      - Therapeutic activities completed for duration as noted below including:  Retested pt's grip strength with pt improving L UE grip strength but still with increased complaints of discomfort and tenderness after squeezing dynamometer.  Results: Average Right: 67.4 lbs Left  48 lbs   Initiated Putty Activities/Exercises with pink putty to progress strengthening, coordination and endurance of LUE use.  Patient provided visual demonstration, verbal and tactile cues as needed to improve performance of the various exercises/activities including:   - Putty Squeezes - cues to squeeze putty into log for use with other exercises and to roll putty into a cinnamon bun with 1 hand  - Putty Rolls - encourage to roll putty into logs with sensory stimulation to entire length of hand, fingers and wrist as needed   - Pinch and Pull with Putty - this motion is combined with different pinches (3-Point Pinch, Tip Pinch, Key Pinch) - patient encouraged to combine tripod, pincer and/or key pinch with pinch and pull motion of putty pulling away from midline, changing between different pinches and changing different directions to change grip.  Pt also encouraged to pull legs off putty to make an 'octopus' to combine her different pinches with wrist in different position.  - Finger Extension with Putty - pt shown how to work on task with all fingers and thumb as well as individual fingers in opposition to thumb  - Finger Adduction with Putty - pt shown how to work on weaving putty between fingers/thumb and then squeeze fingers together while laying hand flat on table top.  - Removing Objects from Putty  - encouraged to hide items (coins, marble, dice etc) and use one hand at a time to find the objects and identify  them by tactile input before s/he digs them out and can see them visually.  OT educated patient on theraputty recommendations: avoid small children, pets, hot environments, place in designated container and avoid contact with fabrics. In addition, pt encouraged to limit time to 15-20 minutes at a time, alternate LUE with RUE (with mod cues for carryover). Patient verbalized understanding.    Patient benefited from extra time, verbal/tactile cues, and modeling of task to allow time  for processing of verbal instructions and improve motor planning of unfamiliar movements.  PATIENT EDUCATION: Education details: Putty Activities Person educated: Patient Education method: Explanation, Demonstration, Verbal cues, and Handouts Education comprehension: verbalized understanding, returned demonstration, verbal cues required, tactile cues required, and needs further education  HOME EXERCISE PROGRAM: 11/28/23: Tendon Gliding Exercises 12/05/23: AROM/AAROM exercises 12/31/23: Coordination Activities 01/14/24: Putty Activities  GOALS: Goals reviewed with patient? Yes  SHORT TERM GOALS: Target date: 12/20/23  Pt will obtain protective, custom forearm based thumb spica splint/orthotic. Baseline: New to outpt OT Goal status: MET   2.  Pt will demo/state understanding of initial HEP to improve pain levels and prerequisite motion for functional use of left hand/thumb. Baseline: New to outpt OT. Tendon gliding ROM provided at eval. Goal status: MET     LONG TERM GOALS: Target date: 02/07/24   Patient will demonstrate at least 16% improvement with quick Dash score (reporting <50% disability or less) indicating improved functional use of affected extremity.  Baseline: New to outpt OT ~ >63% Goal status: IN PROGRESS   2.  Pt will improve grip strength in left hand from unable to assess to at least 40 lbs for functional use at home and in IADLs. Baseline: New to outpt OT ~ unable to assess Goal status: MET  12/31/23 Left 14.5, 35.9, 41.8 Average 30.7 lbs 01/14/24 Average Right: 67.4 lbs Left 48 lbs   3.  Pt will improve A/ROM in left thumb from opposition to tip of index finger on left to at least opposition to PIP (or greater) of left small finger, to have functional motion for tasks like ADLs, reach, and grasp.  Baseline: New to outpt OT ~ No active IP flexion Goal status: IN PROGRESS   4.  Pt will improve left hand functional use from unable to assess to at least Mod I for  basic ADLs (ie, brushing her hair, bathing, brushing her teeth and folding laundry) to assist in ability to carry out self-care and higher-level homecare tasks with less difficulty. Baseline: New to outpt OT with family assistance with bra/spanx Goal status: IN Progress   5.  Pt will improve coordination skills in left hand, as seen by better score on 9 hole peg testing to have increased functional ability to carry out fine motor tasks (fasteners, etc.) and more complex, coordinated IADLs (meal prep, sports, etc.).  Baseline: New to outpt OT - unable to complete due to poor IP flexion Goal status: IN Progress  12/18/23 9 Hole Peg test: Right: 24.29 sec; Left: 26.46 sec   6.  Pt will decrease pain at worst from 5/10 to 2/10 or less to have better sleep and occupational participation in daily roles. Baseline: New to outpt OT Goal status: IN PROGRESS  ASSESSMENT:  CLINICAL IMPRESSION:  Patient presents with ongoing limits with functional use of the left thumb following a distal phalanx fracture. She participated well in putty HEP and verbalizes understanding of HEP activities including need to lmit LUE activities, engage RUE etc but still needed min to mod  cues for carryover.  Pt still has stiffness of IP joint, with some ongoing edema and preference for brace for comfort. She will continue to benefit from skilled outpatient OT to improve functional use of L hand for ADLs and IADLs.   PERFORMANCE DEFICITS: in functional skills including ADLs, IADLs, coordination, dexterity, proprioception, sensation, edema, tone, ROM, strength, pain, fascial restrictions, muscle spasms, flexibility, Fine motor control, Gross motor control, mobility, balance, body mechanics, endurance, decreased knowledge of precautions, decreased knowledge of use of DME, wound, skin integrity, and UE functional use, cognitive skills including memory, problem solving, and safety awareness, and psychosocial skills including coping  strategies, environmental adaptation, and routines and behaviors.   IMPAIRMENTS: are limiting patient from ADLs, IADLs, rest and sleep, leisure, and social participation.   COMORBIDITIES: has co-morbidities such as OA that affects occupational performance. Patient will benefit from skilled OT to address above impairments and improve overall function.  PLAN:  OT FREQUENCY: 1-2x/week   OT DURATION: 10 weeks   PLANNED INTERVENTIONS: 97535 self care/ADL training, 02889 therapeutic exercise, 97530 therapeutic activity, 97140 manual therapy, 97035 ultrasound, 97018 paraffin, 02960 fluidotherapy, 97010 moist heat, 97010 cryotherapy, 97763 Orthotics management and training, 02239 Splinting (initial encounter), scar mobilization, passive range of motion, patient/family education, and DME and/or AE instructions  PLAN FOR NEXT SESSION:   Update HEP per [place]  Protocol for thumb fracture Edema control/scar management PRN Assess/progress strengthening s/p 10 week mark  Per [place]  Hand Protocol for post op MCP pp 258-259 and distal phalanx fracture pg 274  Clarita LITTIE Pride, OT 01/14/2024, 5:53 PM

## 2024-01-14 NOTE — Patient Instructions (Signed)
 Date: 01/14/2024 Prepared by: Clarita Pride  Exercises - Putty Squeezes  - 1-2 x daily - 10 reps - Rolling Putty on Table  - 1-2 x daily - 10 reps - Finger Pinch and Pull with Putty  - 1-2 x daily - 10 reps - 3-Point Pinch with Putty  - 1-2 x daily - 10 reps - Tip PUSH with Putty  - 1-2 x daily - 10 reps - Key Pinch with Putty  - 1-2 x daily - 10 reps - Finger Extension with Putty  - 1-2 x daily - 10 reps - Finger Adduction with Putty  - 1-2 x daily - 10 reps - Removing Marbles from Putty  - 1-2 x daily - 10 reps

## 2024-01-15 LAB — OPHTHALMOLOGY REPORT-SCANNED

## 2024-01-20 ENCOUNTER — Encounter: Payer: Self-pay | Admitting: Radiology

## 2024-01-24 ENCOUNTER — Other Ambulatory Visit: Payer: Self-pay | Admitting: Internal Medicine

## 2024-01-24 ENCOUNTER — Ambulatory Visit: Payer: Self-pay | Attending: Orthopedic Surgery | Admitting: Occupational Therapy

## 2024-01-24 DIAGNOSIS — M25542 Pain in joints of left hand: Secondary | ICD-10-CM | POA: Insufficient documentation

## 2024-01-24 DIAGNOSIS — L905 Scar conditions and fibrosis of skin: Secondary | ICD-10-CM | POA: Insufficient documentation

## 2024-01-24 DIAGNOSIS — R208 Other disturbances of skin sensation: Secondary | ICD-10-CM | POA: Insufficient documentation

## 2024-01-24 DIAGNOSIS — M25649 Stiffness of unspecified hand, not elsewhere classified: Secondary | ICD-10-CM | POA: Insufficient documentation

## 2024-01-24 DIAGNOSIS — R278 Other lack of coordination: Secondary | ICD-10-CM | POA: Insufficient documentation

## 2024-01-24 DIAGNOSIS — R600 Localized edema: Secondary | ICD-10-CM | POA: Insufficient documentation

## 2024-01-24 DIAGNOSIS — M6281 Muscle weakness (generalized): Secondary | ICD-10-CM | POA: Insufficient documentation

## 2024-01-24 DIAGNOSIS — R29898 Other symptoms and signs involving the musculoskeletal system: Secondary | ICD-10-CM | POA: Insufficient documentation

## 2024-01-24 NOTE — Therapy (Signed)
 OUTPATIENT OCCUPATIONAL THERAPY ORTHO TREATMENT  Patient Name: Misty [name] MRN: [id number] DOB:11-10-1960, 63 y.o., female Today's Date: 01/24/2024  PCP: Geofm Glade PARAS, MD REFERRING PROVIDER: Arlinda Buster, MD  END OF SESSION:  OT End of Session - 01/24/24 1333     Visit Number 8    Number of Visits 16    Date for Recertification  02/07/24    Authorization Type [employer] Dual 2025 Covered 100 %    Authorization Time Period NO AUTH REQUIRED VL: [place]    Progress Note Due on Visit 10    OT Start Time 1332   pt arriving late   OT Stop Time 1359    OT Time Calculation (min) 27 min    Equipment Utilized During Treatment Pink putty    Activity Tolerance Patient tolerated treatment well    Behavior During Therapy WFL for tasks assessed/performed         Past Medical History:  Diagnosis Date   Allergy    Arthritis    Back pain    Colon cancer (HCC)    Depression    Diarrhea    GERD (gastroesophageal reflux disease)    Hypertension    Insomnia    MS (multiple sclerosis) 2000   pt says that she doesn't have it   Other hammer toe (acquired) 03/31/2013   Pneumonia    hx of   Pre-diabetes    Umbilical hernia    Past Surgical History:  Procedure Laterality Date   ABDOMINAL HYSTERECTOMY     ANTERIOR CERVICAL DECOMP/DISCECTOMY FUSION N/A 03/01/2014   Procedure: ANTERIOR CERVICAL DECOMPRESSION/DISCECTOMY FUSION 1 LEVEL;  Surgeon: Victory DELENA Gunnels, MD;  Location: [facility];  Service: Neurosurgery;  Laterality: N/A;  ANTERIOR CERVICAL DECOMPRESSION/DISCECTOMY FUSION 1 LEVEL   BUNIONECTOMY Right 10/14/2014   @[facility]    CESAREAN SECTION     COLON SURGERY     COLONOSCOPY     FOOT SURGERY Right    GASTRIC BYPASS     Hammer Toe Repair Right 10/14/2014   RT #2, @[facility]    INCISION AND DRAINAGE HIP Left 06/02/2020   Procedure: IRRIGATION AND DEBRIDEMENT LEFT HIP, POSSIBLE HEAD BALL AND LINER EXCHANGE;  Surgeon: Fidel Rogue, MD;  Location: [facility];  Service: Orthopedics;  Laterality: Left;     KNEE ARTHROPLASTY Right 06/14/2022   Procedure: COMPUTER ASSISTED TOTAL KNEE ARTHROPLASTY;  Surgeon: Fidel Rogue, MD;  Location: [facility];  Service: Orthopedics;  Laterality: Right;  160   TENOTOMY Right 10/14/2014   RT #3, @[facility]    TOTAL HIP ARTHROPLASTY Left 04/28/2020   Procedure: TOTAL HIP ARTHROPLASTY ANTERIOR APPROACH;  Surgeon: Fidel Rogue, MD;  Location: [facility];  Service: Orthopedics;  Laterality: Left;  3E bed   TOTAL HIP ARTHROPLASTY Right 01/25/2021   Procedure: TOTAL HIP ARTHROPLASTY ANTERIOR APPROACH;  Surgeon: Fidel Rogue, MD;  Location: [facility];  Service: Orthopedics;  Laterality: Right;   Patient Active Problem List   Diagnosis Date Noted   Obesity (BMI 30.0-34.9) 10/17/2023   S/P TKR (total knee replacement), right  04/2022 10/17/2023   GERD (gastroesophageal reflux disease) 04/19/2023   Rib pain on right side 02/06/2023   Iron  deficiency anemia 10/16/2021   CKD (chronic kidney disease) stage 3, GFR 30-59 ml/min (HCC) 10/15/2021   RUQ pain 10/12/2021   S/P hip replacement, bilateral 05/30/2021   Pain due to onychomycosis of toenail of right foot 04/25/2021   Osteoarthritis of right hip 01/25/2021   Avascular necrosis of femoral head (HCC) 01/25/2021   Toxic  neuropathy 07/08/2020   Osteoarthritis of left hip 02/19/2020   COVID-19 virus infection 04/18/2019   Restless leg syndrome 03/10/2019   Hyperlipidemia 11/27/2018   Anemia 11/25/2018   Nausea 09/17/2018   Right shoulder pain 03/03/2018   Pancreatitis 02/22/2018   Alcohol  abuse 02/22/2018   Cholelithiasis 02/22/2018   Adhesive capsulitis of right shoulder 01/20/2018   Ventral hernia 09/16/2017   Prediabetes 04/16/2017   Spinal stenosis of lumbar region 11/29/2015   Positive urine drug screen 11/16/2015   Umbilical hernia 10/12/2015   Sleeping difficulties 07/15/2015   Bunion, right 06/20/2015   B12 deficiency 03/30/2015   Hammer toe of left foot 02/16/2015   Fibrosis of skin of lower extremity  02/16/2015   Cervical spondylosis with radiculopathy 03/01/2014   Hammer toe of right foot 01/26/2014   Benign intracranial hypertension 06/18/2013   Porokeratosis 03/31/2013   Vitamin D  deficiency 04/18/2010   MEDIAL MENISCUS TEAR, LEFT 06/30/2008   Current tear knee, medial meniscus 06/30/2008   BARRETTS ESOPHAGUS 06/28/2008   Osteoarthritis of right knee 06/28/2008   HYPERTENSION, BENIGN ESSENTIAL 05/24/2008   Irritable bowel syndrome 05/24/2008   BARIATRIC SURGERY STATUS 03/19/2006   History of malignant neoplasm of large intestine 03/19/1997   ONSET DATE: Referral: 10/29/2023  Surgery 10/31/23; Fall 10/19/33  REFERRING DIAG:   D37.477J (ICD-10-CM) - Closed displaced fracture of distal phalanx of left thumb, initial encounter Needs OT to start 4 weeks s/p left thumb closed reduction with perc pinning on 10/31/23  THERAPY DIAG:  Muscle weakness (generalized)  Stiffness of thumb joint  Pain in joint of left hand  Other lack of coordination  Other symptoms and signs involving the musculoskeletal system  Localized edema  Other disturbances of skin sensation  Scar condition and fibrosis of skin  Rationale for Evaluation and Treatment: Rehabilitation  SUBJECTIVE:   SUBJECTIVE STATEMENT:  Pt reports she has no business going backwards in her recovery from her surgery. She notes she has changed 2 things: she is wearing her splint at night when she is sleeping and she started the putty exercises. She reports that she will do her exercises while she is watching the [place] [employer], which is ~17 min long (commercial free).   Pt accompanied by: Self   PERTINENT HISTORY:  Patient is a 63 year old female with a history of MS (pt reports she took meds ~ 10 years but then stopped taking it and has had falls etc since then), hypertension, degenerative disc disease on chronic pain medication with Xtampza  and oxycodone  10 mg daily who presented to the hospital 10/21/23 with pain  on the left side of her body after a fall the night before ~9 PM.   Xray results: IMPRESSION: 1. Acute, mildly displaced intra-articular fracture involving the base of the 1st metacarpal with associated mild radial subluxation at the 1st carpometacarpal joint. 2. Mildly displaced intra-articular fracture of the base of the 1st distal phalanx.  PMHx: HTN, IBS, GERD, neuropathy, OA with B hip and R knee replacements, prediabetic, restless leg syndrome, L ring finger injury x 1 year  Pt saw MD 12/30/23. MD note as follows Plan: She continues to do well postoperatively.  X-rays today demonstrate stable appearance of the thumb metacarpal and distal phalanx fractures with appropriate interval healing.  Can begin to wean from the orthosis as tolerated and resume activities as tolerated.  Follow-up with myself in approximate 4 weeks.   PRECAUTIONS: Fall risk. MD requesting thumb spica with IP immobilized and tip protection recommendation per [place]   Hand Protocol for post op distal phalanx fracture pg 274; see also post op MCP repair pp 258-259  RED FLAGS: None   WEIGHT BEARING RESTRICTIONS: Yes L hand  PAIN:  Yes: NPRS scale: 6/10 Pain location: L thumb all the way down to the radial side of the L wrist Pain description: throbbing Aggravating factors: moving it, helping her mother last week Relieving factors:  HEAT and pain meds 2x today 4:30 AM and 2 PM  FALLS: Has patient fallen in last 6 months? Yes. Number of falls 3 - 1 with injury; and recently fell out of the truck trying to close the door with opposite hand but was able to ease herself to the ground - Reports she had gone to grocery store but wasn't supposed to drive.  LIVING ENVIRONMENT: Lives with: lives alone and lives with an adult companion - Pt lives/stays between 2 locations ie) her home and her friends home but his place has stairs, daughters visits regularly - oldest daughter is an [employer] Lives in: House/apartment - was moving  between 2 homes (stairs at friends place) Stairs: 5 steps in garage at friends place - this was where she fell Has following equipment at home: Single point cane, Environmental Consultant - 4 wheeled, shower chair, Shower bench, bed side commode, Grab bars, and uses shower chair, uses cane - mostly with longer distances  PLOF: Independent, driving, had foot surgery and was in therapy for this when she fell.  PATIENT GOALS: Wants to use her hand better - put her bra and spanx on herself  NEXT MD VISIT: 01/27/24  OBJECTIVE:  Note: Objective measures were completed at Evaluation unless otherwise noted.  HAND DOMINANCE: Right  ADLs: Overall ADLs: Gets help with tight fitting clothes Transfers/ambulation related to ADLs: Mod Ind Eating: gets help cutting food - depends on what it is Grooming: Help with hair Upper body dressing: Help with bra/spanx Lower body dressing: Pt pick socks and shoes she can manage herself Toileting: Ind with some difficulty reaching at times Bathing: Pt's daughter has been helping her get a good shower Tub shower transfers: Mod Ind with shower chair Equipment: Shower seat with back, Grab bars, and bed side commode  FUNCTIONAL OUTCOME MEASURES: Quick Dash: TBD ~ Educated determination by OTR > 60%   UPPER EXTREMITY ROM:     Active ROM Right eval Left eval  Shoulder flexion [facility]  Elbow flexion [facility] WFL  Elbow extension    Wrist flexion 60 40  Wrist extension 70 40  Wrist ulnar deviation  Limited  Wrist radial deviation  Limited  Wrist pronation  WFL  Wrist supination  Limited   (Blank rows = not tested)  Active ROM Right eval Left eval  Thumb MCP (0-60)  55  Thumb IP (0-80)  0  Index MCP (0-90)   55  Index PIP (0-100)   65  Index DIP (0-70)    37  Long MCP (0-90)   55   Long PIP (0-100)   70   Long DIP (0-70)   37   Ring MCP (0-90)   55   Ring PIP (0-100)   78   Ring DIP (0-70)    37  Little MCP (0-90)    55  Little PIP (0-100)    78  Little DIP (0-70)    37   (Blank rows = not tested)   UPPER EXTREMITY MMT:     MMT Right eval Left eval  Shoulder flexion 4- 4-  Elbow  flexion 4 4-  Elbow extension 4 4-  Wrist flexion 4 3-  Wrist extension 4 3-  Wrist pronation 4 4-  Wrist supination 4 2  (Blank rows = not tested)  HAND FUNCTION: Grip strength: Right: TBA lbs; Left: TBA lbs  12/31/23: Right 40.7, 66.3, 71.2 Average 59.4 lbs Left 14.5, 35.9, 41.8 Average 30.7 lbs  01/14/24 Right 63.2, 70.5, 68.5 Left 40.1, 51.5, 52.4 Average Right: 67.4 lbs Left 48 lbs  COORDINATION: 12/18/2023: 9 Hole Peg test: Right: 24.29 sec; Left: 26.46 sec  SENSATION: Not tested  EDEMA: L hand/digits swollen s/p removal of cast today and pins  COGNITION: Overall cognitive status: Within functional limits for tasks assessed  OBSERVATIONS: Pt ambulates with no AE and no obvious loss of balance but with abnormal gait pattern s/p B hip and R knee replacements. The pt appears well kept and was concerned about the dry flaking skin around her hand/digits with education to gentle exfoliate skin but not to apply lotion yet as pin sites are not yet healed s/p removal of pins today.   TODAY'S TREATMENT:      - Self-care/home management completed for duration as noted below including: OT educated pt that based on arthritic changes in B hands, would recommend reduction in putty exercises and more diligence with counting reps or setting a timer to prevent overuse leading to increased pain. Furthermore, OT explained that while some increase to pain is expected, the severity she is reporting is not warranted. Pt was instructed she could forego splint wear at night on trial basis to see if this is improves pain. HEP sent to pt phone for carryover.   - Therapeutic activities completed for duration as noted below including:  Reviewed Putty Activities/Exercises with pink putty to progress strengthening, coordination and endurance of LUE use.  Patient provided visual  demonstration, verbal and tactile cues as needed to improve performance of the various exercises/activities including:   - Putty Squeezes - cues to squeeze putty into log for use with other exercises and to roll putty into a cinnamon bun with 1 hand  - Putty Rolls - encourage to roll putty into logs with sensory stimulation to entire length of hand, fingers and wrist as needed   - Pinch and Pull with Putty - this motion is combined with different pinches (3-Point Pinch, Tip Pinch, Key Pinch) - patient encouraged to combine tripod, pincer and/or key pinch with pinch and pull motion of putty pulling away from midline, changing between different pinches and changing different directions to change grip.  Pt also encouraged to pull legs off putty to make an 'octopus' to combine her different pinches with wrist in different position.  OT provided extensive education to patient recommending that she count her reps OR set a timer for 5-10 minutes and put the putty away at that point.   PATIENT EDUCATION: Education details: Putty; pain management Person educated: Patient Education method: Explanation, Demonstration, Verbal cues, and Handouts Education comprehension: verbalized understanding, returned demonstration, verbal cues required, tactile cues required, and needs further education  HOME EXERCISE PROGRAM: 11/28/23: Tendon Gliding Exercises 12/05/23: AROM/AAROM exercises 12/31/23: Coordination Activities 01/14/24: Putty Activities 01/24/2024: updated putty HEP Access Code: [email or web address]  GOALS: Goals reviewed with patient? Yes  SHORT TERM GOALS: Target date: 12/20/23  Pt will obtain protective, custom forearm based thumb spica splint/orthotic. Baseline: New to outpt OT Goal status: MET   2.  Pt will demo/state understanding of initial HEP to improve pain levels and prerequisite motion for functional  use of left hand/thumb. Baseline: New to outpt OT. Tendon gliding ROM provided at eval. Goal  status: MET     LONG TERM GOALS: Target date: 02/07/24   Patient will demonstrate at least 16% improvement with quick Dash score (reporting <50% disability or less) indicating improved functional use of affected extremity.  Baseline: New to outpt OT ~ >63% Goal status: IN PROGRESS   2.  Pt will improve grip strength in left hand from unable to assess to at least 40 lbs for functional use at home and in IADLs. Baseline: New to outpt OT ~ unable to assess Goal status: MET  12/31/23 Left 14.5, 35.9, 41.8 Average 30.7 lbs 01/14/24 Average Right: 67.4 lbs Left 48 lbs   3.  Pt will improve A/ROM in left thumb from opposition to tip of index finger on left to at least opposition to PIP (or greater) of left small finger, to have functional motion for tasks like ADLs, reach, and grasp.  Baseline: New to outpt OT ~ No active IP flexion Goal status: IN PROGRESS   4.  Pt will improve left hand functional use from unable to assess to at least Mod I for basic ADLs (ie, brushing her hair, bathing, brushing her teeth and folding laundry) to assist in ability to carry out self-care and higher-level homecare tasks with less difficulty. Baseline: New to outpt OT with family assistance with bra/spanx Goal status: IN Progress   5.  Pt will improve coordination skills in left hand, as seen by better score on 9 hole peg testing to have increased functional ability to carry out fine motor tasks (fasteners, etc.) and more complex, coordinated IADLs (meal prep, sports, etc.).  Baseline: New to outpt OT - unable to complete due to poor IP flexion Goal status: IN Progress  12/18/23 9 Hole Peg test: Right: 24.29 sec; Left: 26.46 sec   6.  Pt will decrease pain at worst from 5/10 to 2/10 or less to have better sleep and occupational participation in daily roles. Baseline: New to outpt OT Goal status: IN PROGRESS  ASSESSMENT:  CLINICAL IMPRESSION:  Patient presents with ongoing limits with functional use of the  left thumb following a distal phalanx fracture. Reported pain increase likely due to overuse of LUE with completion of putty HEP. Recommend reduction in number of exercises and more compliance with reps/time secondary to arthritic changes in hand.   PERFORMANCE DEFICITS: in functional skills including ADLs, IADLs, coordination, dexterity, proprioception, sensation, edema, tone, ROM, strength, pain, fascial restrictions, muscle spasms, flexibility, Fine motor control, Gross motor control, mobility, balance, body mechanics, endurance, decreased knowledge of precautions, decreased knowledge of use of DME, wound, skin integrity, and UE functional use, cognitive skills including memory, problem solving, and safety awareness, and psychosocial skills including coping strategies, environmental adaptation, and routines and behaviors.   IMPAIRMENTS: are limiting patient from ADLs, IADLs, rest and sleep, leisure, and social participation.   COMORBIDITIES: has co-morbidities such as OA that affects occupational performance. Patient will benefit from skilled OT to address above impairments and improve overall function.  PLAN:  OT FREQUENCY: 1-2x/week   OT DURATION: 10 weeks   PLANNED INTERVENTIONS: 97535 self care/ADL training, 02889 therapeutic exercise, 97530 therapeutic activity, 97140 manual therapy, 97035 ultrasound, 97018 paraffin, 02960 fluidotherapy, 97010 moist heat, 97010 cryotherapy, 97763 Orthotics management and training, 02239 Splinting (initial encounter), scar mobilization, passive range of motion, patient/family education, and DME and/or AE instructions  PLAN FOR NEXT SESSION:   Update HEP per [place]  Protocol for  thumb fracture Edema control/scar management PRN Assess/progress strengthening s/p 10 week mark  Per [place]  Hand Protocol for post op MCP pp [phone] and distal phalanx fracture pg [address] [place], OT 01/24/2024, 2:10 PM

## 2024-01-24 NOTE — Patient Instructions (Signed)
 [email or web address]  Access Code: [email or web address] [facility]  Clinician Notes Make sure to COUNT your reps or set a timer for 5-10 minutes

## 2024-01-25 ENCOUNTER — Other Ambulatory Visit: Payer: Self-pay | Admitting: Internal Medicine

## 2024-01-25 NOTE — Progress Notes (Unsigned)
   Misty [name] - 63 y.o. female MRN [id number]  Date of birth: 03-28-1960  Office Visit Note: Visit Date: 01/27/2024 PCP: Geofm Glade PARAS, MD Referred by: Geofm Glade PARAS, MD  Subjective:  HPI: Misty [name] is a 63 y.o. female who presents today for follow up 13 weeks status post left thumb metacarpal fracture and distal phalanx fracture closed reduction with percutaneous pinning.  She is doing well overall, pain is controlled, has been working with occupational therapy as instructed.  Unrelated, she has ongoing pain at the right ring finger nailbed region along the ulnar border.  Pertinent ROS were reviewed with the patient and found to be negative unless otherwise specified above in HPI.   Assessment & Plan: Visit Diagnoses:  1. Closed Bennett's fracture of left thumb, initial encounter      Plan: She continues to do well postoperatively.  X-rays today demonstrate stable appearance of the thumb metacarpal and distal phalanx fractures with appropriate interval healing.    She expressed concern about her ongoing right ring finger pain along the nailbed and eponychial fold region of the right ring finger.  Dermatologic referral was placed today.  Follow-up: No follow-ups on file.   Meds & Orders: No orders of the defined types were placed in this encounter.   Orders Placed This Encounter  Procedures   XR Finger Thumb Left   Ambulatory referral to Dermatology     Procedures: No procedures performed       Objective:   Vital Signs: There were no vitals taken for this visit.  Ortho Exam Left thumb with minimal tenderness diffusely, able to perform thumb opposition to the small finger PIP, thumb circumduction without significant pain, IP range of motion 0-45, sensation intact distally, thumb warm well-perfused  Imaging: XR Finger Thumb Left Result Date: 01/27/2024 X-rays of the left thumb demonstrate appropriately healing of the metacarpal fracture and the  previously known distal phalanx fracture.  Moderate degenerative changes are seen at the IP joint as well as the thumb CMC region.     Misty [name] Afton Alderton, M.D. [facility], Hand Surgery

## 2024-01-27 ENCOUNTER — Ambulatory Visit: Admitting: Orthopedic Surgery

## 2024-01-27 ENCOUNTER — Other Ambulatory Visit: Payer: Self-pay | Admitting: Internal Medicine

## 2024-01-27 ENCOUNTER — Other Ambulatory Visit

## 2024-01-27 DIAGNOSIS — Z1231 Encounter for screening mammogram for malignant neoplasm of breast: Secondary | ICD-10-CM

## 2024-01-27 DIAGNOSIS — S62212A Bennett's fracture, left hand, initial encounter for closed fracture: Secondary | ICD-10-CM | POA: Diagnosis not present

## 2024-01-28 ENCOUNTER — Ambulatory Visit: Payer: Self-pay | Admitting: Occupational Therapy

## 2024-01-28 DIAGNOSIS — M6281 Muscle weakness (generalized): Secondary | ICD-10-CM

## 2024-01-28 DIAGNOSIS — M25649 Stiffness of unspecified hand, not elsewhere classified: Secondary | ICD-10-CM

## 2024-01-28 DIAGNOSIS — R278 Other lack of coordination: Secondary | ICD-10-CM

## 2024-01-28 DIAGNOSIS — R29898 Other symptoms and signs involving the musculoskeletal system: Secondary | ICD-10-CM

## 2024-01-28 DIAGNOSIS — M25542 Pain in joints of left hand: Secondary | ICD-10-CM

## 2024-01-28 NOTE — Therapy (Signed)
 OUTPATIENT OCCUPATIONAL THERAPY ORTHO TREATMENT  Patient Name: Misty [name] MRN: [id number] DOB:Aug 11, 1960, 63 y.o., female Today's Date: 01/28/2024  PCP: Geofm Glade PARAS, MD REFERRING PROVIDER: Arlinda Buster, MD  END OF SESSION:  OT End of Session - 01/28/24 1021     Visit Number 9    Number of Visits 16    Date for Recertification  02/07/24    Authorization Type [employer] Dual 2025 Covered 100 %    Authorization Time Period NO AUTH REQUIRED VL: [place]    Progress Note Due on Visit 10    OT Start Time 1020    OT Stop Time 1100    OT Time Calculation (min) 40 min    Equipment Utilized During Treatment splint material    Activity Tolerance Patient tolerated treatment well    Behavior During Therapy WFL for tasks assessed/performed         Past Medical History:  Diagnosis Date   Allergy    Arthritis    Back pain    Colon cancer (HCC)    Depression    Diarrhea    GERD (gastroesophageal reflux disease)    Hypertension    Insomnia    MS (multiple sclerosis) 2000   pt says that she doesn't have it   Other hammer toe (acquired) 03/31/2013   Pneumonia    hx of   Pre-diabetes    Umbilical hernia    Past Surgical History:  Procedure Laterality Date   ABDOMINAL HYSTERECTOMY     ANTERIOR CERVICAL DECOMP/DISCECTOMY FUSION N/A 03/01/2014   Procedure: ANTERIOR CERVICAL DECOMPRESSION/DISCECTOMY FUSION 1 LEVEL;  Surgeon: Victory DELENA Gunnels, MD;  Location: [facility];  Service: Neurosurgery;  Laterality: N/A;  ANTERIOR CERVICAL DECOMPRESSION/DISCECTOMY FUSION 1 LEVEL   BUNIONECTOMY Right 10/14/2014   @[facility]    CESAREAN SECTION     COLON SURGERY     COLONOSCOPY     FOOT SURGERY Right    GASTRIC BYPASS     Hammer Toe Repair Right 10/14/2014   RT #2, @[facility]    INCISION AND DRAINAGE HIP Left 06/02/2020   Procedure: IRRIGATION AND DEBRIDEMENT LEFT HIP, POSSIBLE HEAD BALL AND LINER EXCHANGE;  Surgeon: Fidel Rogue, MD;  Location: [facility];  Service: Orthopedics;  Laterality: Left;     KNEE ARTHROPLASTY Right 06/14/2022   Procedure: COMPUTER ASSISTED TOTAL KNEE ARTHROPLASTY;  Surgeon: Fidel Rogue, MD;  Location: [facility];  Service: Orthopedics;  Laterality: Right;  160   TENOTOMY Right 10/14/2014   RT #3, @[facility]    TOTAL HIP ARTHROPLASTY Left 04/28/2020   Procedure: TOTAL HIP ARTHROPLASTY ANTERIOR APPROACH;  Surgeon: Fidel Rogue, MD;  Location: [facility];  Service: Orthopedics;  Laterality: Left;  3E bed   TOTAL HIP ARTHROPLASTY Right 01/25/2021   Procedure: TOTAL HIP ARTHROPLASTY ANTERIOR APPROACH;  Surgeon: Fidel Rogue, MD;  Location: [facility];  Service: Orthopedics;  Laterality: Right;   Patient Active Problem List   Diagnosis Date Noted   Obesity (BMI 30.0-34.9) 10/17/2023   S/P TKR (total knee replacement), right  04/2022 10/17/2023   GERD (gastroesophageal reflux disease) 04/19/2023   Rib pain on right side 02/06/2023   Iron  deficiency anemia 10/16/2021   CKD (chronic kidney disease) stage 3, GFR 30-59 ml/min (HCC) 10/15/2021   RUQ pain 10/12/2021   S/P hip replacement, bilateral 05/30/2021   Pain due to onychomycosis of toenail of right foot 04/25/2021   Osteoarthritis of right hip 01/25/2021   Avascular necrosis of femoral head (HCC) 01/25/2021   Toxic neuropathy 07/08/2020  Osteoarthritis of left hip 02/19/2020   COVID-19 virus infection 04/18/2019   Restless leg syndrome 03/10/2019   Hyperlipidemia 11/27/2018   Anemia 11/25/2018   Nausea 09/17/2018   Right shoulder pain 03/03/2018   Pancreatitis 02/22/2018   Alcohol  abuse 02/22/2018   Cholelithiasis 02/22/2018   Adhesive capsulitis of right shoulder 01/20/2018   Ventral hernia 09/16/2017   Prediabetes 04/16/2017   Spinal stenosis of lumbar region 11/29/2015   Positive urine drug screen 11/16/2015   Umbilical hernia 10/12/2015   Sleeping difficulties 07/15/2015   Bunion, right 06/20/2015   B12 deficiency 03/30/2015   Hammer toe of left foot 02/16/2015   Fibrosis of skin of lower extremity  02/16/2015   Cervical spondylosis with radiculopathy 03/01/2014   Hammer toe of right foot 01/26/2014   Benign intracranial hypertension 06/18/2013   Porokeratosis 03/31/2013   Vitamin D  deficiency 04/18/2010   MEDIAL MENISCUS TEAR, LEFT 06/30/2008   Current tear knee, medial meniscus 06/30/2008   BARRETTS ESOPHAGUS 06/28/2008   Osteoarthritis of right knee 06/28/2008   HYPERTENSION, BENIGN ESSENTIAL 05/24/2008   Irritable bowel syndrome 05/24/2008   BARIATRIC SURGERY STATUS 03/19/2006   History of malignant neoplasm of large intestine 03/19/1997   ONSET DATE: Referral: 10/29/2023  Surgery 10/31/23; Fall 10/19/33  REFERRING DIAG:   D37.477J (ICD-10-CM) - Closed displaced fracture of distal phalanx of left thumb, initial encounter Needs OT to start 4 weeks s/p left thumb closed reduction with perc pinning on 10/31/23  THERAPY DIAG:  Stiffness of thumb joint  Pain in joint of left hand  Muscle weakness (generalized)  Other lack of coordination  Other symptoms and signs involving the musculoskeletal system  Rationale for Evaluation and Treatment: Rehabilitation  SUBJECTIVE:   SUBJECTIVE STATEMENT:  Pt reported she went to the doctor yesterday and her released her.  MD report:  follow up 13 weeks status post left thumb metacarpal fracture and distal phalanx fracture closed reduction with percutaneous pinning.  She is doing well overall, pain is controlled, has been working with occupational therapy as instructed.   Plan: She continues to do well postoperatively.  X-rays today demonstrate stable appearance of the thumb metacarpal and distal phalanx fractures with appropriate interval healing.     She expressed concern about her ongoing right ring finger pain along the nailbed and eponychial fold region of the right ring finger.  Dermatologic referral was placed today.  XR Finger Thumb Left Result Date: 01/27/2024 X-rays of the left thumb demonstrate appropriately healing of  the metacarpal fracture and the previously known distal phalanx fracture.  Moderate degenerative changes are seen at the IP joint as well as the thumb CMC region.  Pt accompanied by: Self   PERTINENT HISTORY:  Patient is a 63 year old female with a history of MS (pt reports she took meds ~ 10 years but then stopped taking it and has had falls etc since then), hypertension, degenerative disc disease on chronic pain medication with Xtampza  and oxycodone  10 mg daily who presented to the hospital 10/21/23 with pain on the left side of her body after a fall the night before ~9 PM.   Xray results: IMPRESSION: 1. Acute, mildly displaced intra-articular fracture involving the base of the 1st metacarpal with associated mild radial subluxation at the 1st carpometacarpal joint. 2. Mildly displaced intra-articular fracture of the base of the 1st distal phalanx.  PMHx: HTN, IBS, GERD, neuropathy, OA with B hip and R knee replacements, prediabetic, restless leg syndrome, L ring finger injury x 1 year  Pt saw  MD 12/30/23. MD note as follows Plan: She continues to do well postoperatively.  X-rays today demonstrate stable appearance of the thumb metacarpal and distal phalanx fractures with appropriate interval healing.  Can begin to wean from the orthosis as tolerated and resume activities as tolerated.  Follow-up with myself in approximate 4 weeks.   PRECAUTIONS: Fall risk. MD requesting thumb spica with IP immobilized and tip protection recommendation per [place]  Hand Protocol for post op distal phalanx fracture pg 274; see also post op MCP repair pp 258-259  RED FLAGS: None   WEIGHT BEARING RESTRICTIONS: Yes L hand  PAIN:  Yes: NPRS scale: 4/10 Pain location: L thumb all the way down to the radial side of the L wrist Pain description: throbbing Aggravating factors: moving it,  Relieving factors:  HEAT and pain meds 2x today 4:30 AM and 2 PM  FALLS: Has patient fallen in last 6 months? Yes. Number of  falls 3 - 1 with injury; and recently fell out of the truck trying to close the door with opposite hand but was able to ease herself to the ground - Reports she had gone to grocery store but wasn't supposed to drive.  LIVING ENVIRONMENT: Lives with: lives alone and lives with an adult companion - Pt lives/stays between 2 locations ie) her home and her friends home but his place has stairs, daughters visits regularly - oldest daughter is an [employer] Lives in: House/apartment - was moving between 2 homes (stairs at friends place) Stairs: 5 steps in garage at friends place - this was where she fell Has following equipment at home: Single point cane, Environmental Consultant - 4 wheeled, shower chair, Shower bench, bed side commode, Grab bars, and uses shower chair, uses cane - mostly with longer distances  PLOF: Independent, driving, had foot surgery and was in therapy for this when she fell.  PATIENT GOALS: Wants to use her hand better - put her bra and spanx on herself  NEXT MD VISIT: NA  OBJECTIVE:  Note: Objective measures were completed at Evaluation unless otherwise noted.  HAND DOMINANCE: Right  ADLs: Overall ADLs: Gets help with tight fitting clothes Transfers/ambulation related to ADLs: Mod Ind Eating: gets help cutting food - depends on what it is Grooming: Help with hair Upper body dressing: Help with bra/spanx Lower body dressing: Pt pick socks and shoes she can manage herself Toileting: Ind with some difficulty reaching at times Bathing: Pt's daughter has been helping her get a good shower Tub shower transfers: Mod Ind with shower chair Equipment: Shower seat with back, Grab bars, and bed side commode  FUNCTIONAL OUTCOME MEASURES: Quick Dash: TBD ~ Educated determination by OTR > 60%   UPPER EXTREMITY ROM:     Active ROM Right eval Left eval  Shoulder flexion [facility]  Elbow flexion [facility]  Elbow extension    Wrist flexion 60 40  Wrist extension 70 40  Wrist ulnar deviation  Limited   Wrist radial deviation  Limited  Wrist pronation  WFL  Wrist supination  Limited   (Blank rows = not tested)  Active ROM Right eval Left eval  Thumb MCP (0-60)  55  Thumb IP (0-80)  0  Index MCP (0-90)   55  Index PIP (0-100)   65  Index DIP (0-70)    37  Long MCP (0-90)   55   Long PIP (0-100)   70   Long DIP (0-70)   37   Ring MCP (0-90)   55  Ring PIP (0-100)   78   Ring DIP (0-70)    37  Little MCP (0-90)    55  Little PIP (0-100)    78  Little DIP (0-70)   37   (Blank rows = not tested)   UPPER EXTREMITY MMT:     MMT Right eval Left eval  Shoulder flexion 4- 4-  Elbow flexion 4 4-  Elbow extension 4 4-  Wrist flexion 4 3-  Wrist extension 4 3-  Wrist pronation 4 4-  Wrist supination 4 2  (Blank rows = not tested)  HAND FUNCTION: Grip strength: Right: TBA lbs; Left: TBA lbs  12/31/23: Right 40.7, 66.3, 71.2 Average 59.4 lbs Left 14.5, 35.9, 41.8 Average 30.7 lbs  01/14/24 Right 63.2, 70.5, 68.5 Left 40.1, 51.5, 52.4 Average Right: 67.4 lbs Left 48 lbs  COORDINATION: 12/18/2023: 9 Hole Peg test: Right: 24.29 sec; Left: 26.46 sec  SENSATION: Not tested  EDEMA: L hand/digits swollen s/p removal of cast today and pins  COGNITION: Overall cognitive status: Within functional limits for tasks assessed  OBSERVATIONS: Pt ambulates with no AE and no obvious loss of balance but with abnormal gait pattern s/p B hip and R knee replacements. The pt appears well kept and was concerned about the dry flaking skin around her hand/digits with education to gentle exfoliate skin but not to apply lotion yet as pin sites are not yet healed s/p removal of pins today.   TODAY'S TREATMENT:      - Self Care education and training completed for duration as noted below including: Therapist initiated joint protection education in response to patient's continued reports of discomfort and joint strain in the L thumb CMC region. Education emphasized strategies to minimize  stress on affected joints and promote longevity of hand function. Introduced concepts from the "LESS" acronym for joint protection principles: L - Listen to your body and rest when painful or fatigued, E - Energy Conservation and task pacing, S - Stronger joints take the lead (use larger, more stable joints for heavy tasks) and S - Strategize by using adaptive equipment, splints and modified techniques to reduce joint load.   - Therapeutic exercises completed for duration as noted below including: Reviewed and reinforced therapeutic putty exercises incorporating joint protection principles, emphasizing gentle resistance and pain-free range. Exercises included light grip and pinch activities to maintain thumb mobility without excessive joint compression. Additional ROM exercises practiced included thumb opposition, composite fist formation, and isolated finger flexion/extension to promote tendon glide and functional hand coordination.  Patient demonstrated ability to complete all tasks with good technique and minimal discomfort, requiring occasional cues to reduce grip force and maintain neutral wrist alignment.  Orthotic Fabrication and Trial: A custom distal phalangeal protection splint was fabricated for the L thumb to safeguard the loosening thumbnail (at risk for avulsion) while maintaining functional thumb opposition for daily activities. Splint fit appropriately, was well tolerated, and allowed full participation in grasp and release tasks without irritation. Additionally, a prefabricated Cool Comfort thumb spica splint was trialed during the session to provide [facility] stabilization and pain reduction during functional activities such as cooking and household chores. Patient reported improved comfort and perceived support with the splint in place. Education provided on appropriate wear schedule, skin monitoring, and cleaning instructions.  Online ordering information for appropriate sizing and product link  was reviewed and provided to patient for home acquisition.  PATIENT EDUCATION: Education details: Pain management & splint options Person educated: Patient Education method: Explanation, Demonstration,  and Verbal cues Education comprehension: verbalized understanding, returned demonstration, verbal cues required, tactile cues required, and needs further education  HOME EXERCISE PROGRAM: 11/28/23: Tendon Gliding Exercises 12/05/23: AROM/AAROM exercises 12/31/23: Coordination Activities 01/14/24: Putty Activities 01/24/2024: updated putty HEP Access Code: [email or web address]  GOALS: Goals reviewed with patient? Yes  SHORT TERM GOALS: Target date: 12/20/23  Pt will obtain protective, custom forearm based thumb spica splint/orthotic. Baseline: New to outpt OT Goal status: MET   2.  Pt will demo/state understanding of initial HEP to improve pain levels and prerequisite motion for functional use of left hand/thumb. Baseline: New to outpt OT. Tendon gliding ROM provided at eval. Goal status: MET     LONG TERM GOALS: Target date: 02/07/24   Patient will demonstrate at least 16% improvement with quick Dash score (reporting <50% disability or less) indicating improved functional use of affected extremity.  Baseline: New to outpt OT ~ >63% Goal status: IN PROGRESS   2.  Pt will improve grip strength in left hand from unable to assess to at least 40 lbs for functional use at home and in IADLs. Baseline: New to outpt OT ~ unable to assess Goal status: MET  12/31/23 Left 14.5, 35.9, 41.8 Average 30.7 lbs 01/14/24 Average Right: 67.4 lbs Left 48 lbs   3.  Pt will improve A/ROM in left thumb from opposition to tip of index finger on left to at least opposition to PIP (or greater) of left small finger, to have functional motion for tasks like ADLs, reach, and grasp.  Baseline: New to outpt OT ~ No active IP flexion Goal status: MET   4.  Pt will improve left hand functional use from unable to assess to  at least Mod I for basic ADLs (ie, brushing her hair, bathing, brushing her teeth and folding laundry) to assist in ability to carry out self-care and higher-level homecare tasks with less difficulty. Baseline: New to outpt OT with family assistance with bra/spanx Goal status: IN Progress   5.  Pt will improve coordination skills in left hand, as seen by better score on 9 hole peg testing to have increased functional ability to carry out fine motor tasks (fasteners, etc.) and more complex, coordinated IADLs (meal prep, sports, etc.).  Baseline: New to outpt OT - unable to complete due to poor IP flexion Goal status: IN Progress  12/18/23 9 Hole Peg test: Right: 24.29 sec; Left: 26.46 sec   6.  Pt will decrease pain at worst from 5/10 to 2/10 or less to have better sleep and occupational participation in daily roles. Baseline: New to outpt OT Goal status: IN PROGRESS  ASSESSMENT:  CLINICAL IMPRESSION:  Patient presents with ongoing limits with functional use of the left thumb following a distal phalanx fracture as well as CMC arthritis.  Patient tolerated all interventions well and demonstrated good understanding of joint protection principles, splint purpose, and appropriate progression of hand exercises to support safe functional recovery while minimizing further CMC joint degeneration. Pt will benefit from continued skilled OT services in the outpatient setting to work on impairments as noted at evaluation to help pt return to [facility] as able.    PERFORMANCE DEFICITS: in functional skills including ADLs, IADLs, coordination, dexterity, proprioception, sensation, edema, tone, ROM, strength, pain, fascial restrictions, muscle spasms, flexibility, Fine motor control, Gross motor control, mobility, balance, body mechanics, endurance, decreased knowledge of precautions, decreased knowledge of use of DME, wound, skin integrity, and UE functional use, cognitive skills including memory, problem solving, and  safety awareness, and psychosocial skills including coping strategies, environmental adaptation, and routines and behaviors.   IMPAIRMENTS: are limiting patient from ADLs, IADLs, rest and sleep, leisure, and social participation.   COMORBIDITIES: has co-morbidities such as OA that affects occupational performance. Patient will benefit from skilled OT to address above impairments and improve overall function.  PLAN:  OT FREQUENCY: 1-2x/week   OT DURATION: 10 weeks   PLANNED INTERVENTIONS: 97535 self care/ADL training, 02889 therapeutic exercise, 97530 therapeutic activity, 97140 manual therapy, 97035 ultrasound, 97018 paraffin, 02960 fluidotherapy, 97010 moist heat, 97010 cryotherapy, 97763 Orthotics management and training, 02239 Splinting (initial encounter), scar mobilization, passive range of motion, patient/family education, and DME and/or AE instructions  PLAN FOR NEXT SESSION:   RECERT versus DC plans Update HEP per [place]  Protocol for thumb fracture Edema control/scar management PRN Assess/progress strengthening s/p 10 week mark  Per [place]  Hand Protocol for post op MCP pp 258-259 and distal phalanx fracture pg 274  Clarita LITTIE Pride, OT 01/28/2024, 8:08 PM

## 2024-01-30 ENCOUNTER — Other Ambulatory Visit: Payer: Self-pay | Admitting: Radiology

## 2024-01-30 DIAGNOSIS — S62212A Bennett's fracture, left hand, initial encounter for closed fracture: Secondary | ICD-10-CM

## 2024-01-30 DIAGNOSIS — M79642 Pain in left hand: Secondary | ICD-10-CM

## 2024-02-04 ENCOUNTER — Other Ambulatory Visit: Payer: Self-pay | Admitting: Radiology

## 2024-02-04 ENCOUNTER — Encounter

## 2024-02-04 DIAGNOSIS — M79644 Pain in right finger(s): Secondary | ICD-10-CM

## 2024-02-05 ENCOUNTER — Ambulatory Visit: Payer: Self-pay | Admitting: Occupational Therapy

## 2024-02-18 ENCOUNTER — Ambulatory Visit: Payer: Self-pay | Attending: Orthopedic Surgery | Admitting: Occupational Therapy

## 2024-02-18 DIAGNOSIS — R278 Other lack of coordination: Secondary | ICD-10-CM | POA: Insufficient documentation

## 2024-02-18 DIAGNOSIS — R29898 Other symptoms and signs involving the musculoskeletal system: Secondary | ICD-10-CM | POA: Insufficient documentation

## 2024-02-18 DIAGNOSIS — M25649 Stiffness of unspecified hand, not elsewhere classified: Secondary | ICD-10-CM | POA: Diagnosis present

## 2024-02-18 DIAGNOSIS — M25542 Pain in joints of left hand: Secondary | ICD-10-CM | POA: Insufficient documentation

## 2024-02-18 DIAGNOSIS — M6281 Muscle weakness (generalized): Secondary | ICD-10-CM | POA: Diagnosis present

## 2024-02-18 DIAGNOSIS — R600 Localized edema: Secondary | ICD-10-CM | POA: Diagnosis present

## 2024-02-18 DIAGNOSIS — R208 Other disturbances of skin sensation: Secondary | ICD-10-CM | POA: Insufficient documentation

## 2024-02-18 DIAGNOSIS — L905 Scar conditions and fibrosis of skin: Secondary | ICD-10-CM | POA: Insufficient documentation

## 2024-02-18 NOTE — Therapy (Signed)
 OUTPATIENT OCCUPATIONAL THERAPY ORTHO TREATMENT & PROGRESS NOTE  Patient Name: Misty [name] MRN: [id number] DOB:January 28, 1961, 63 y.o., female Today's Date: 02/18/2024  PCP: Geofm Glade PARAS, MD REFERRING PROVIDER: Arlinda Buster, MD  END OF SESSION:  OT End of Session - 02/18/24 1102     Visit Number 10    Number of Visits 18    Date for Recertification  04/10/24    Authorization Type [employer] Dual 2025 Covered 100 %    Authorization Time Period NO AUTH REQUIRED VL: [place]    Progress Note Due on Visit 20    OT Start Time 1105    OT Stop Time 1158    OT Time Calculation (min) 53 min    Equipment Utilized During Treatment pre-fab splints    Activity Tolerance Patient tolerated treatment well    Behavior During Therapy WFL for tasks assessed/performed         Past Medical History:  Diagnosis Date   Allergy    Arthritis    Back pain    Colon cancer (HCC)    Depression    Diarrhea    GERD (gastroesophageal reflux disease)    Hypertension    Insomnia    MS (multiple sclerosis) 2000   pt says that she doesn't have it   Other hammer toe (acquired) 03/31/2013   Pneumonia    hx of   Pre-diabetes    Umbilical hernia    Past Surgical History:  Procedure Laterality Date   ABDOMINAL HYSTERECTOMY     ANTERIOR CERVICAL DECOMP/DISCECTOMY FUSION N/A 03/01/2014   Procedure: ANTERIOR CERVICAL DECOMPRESSION/DISCECTOMY FUSION 1 LEVEL;  Surgeon: Victory DELENA Gunnels, MD;  Location: [facility];  Service: Neurosurgery;  Laterality: N/A;  ANTERIOR CERVICAL DECOMPRESSION/DISCECTOMY FUSION 1 LEVEL   BUNIONECTOMY Right 10/14/2014   @[facility]    CESAREAN SECTION     COLON SURGERY     COLONOSCOPY     FOOT SURGERY Right    GASTRIC BYPASS     Hammer Toe Repair Right 10/14/2014   RT #2, @[facility]    INCISION AND DRAINAGE HIP Left 06/02/2020   Procedure: IRRIGATION AND DEBRIDEMENT LEFT HIP, POSSIBLE HEAD BALL AND LINER EXCHANGE;  Surgeon: Fidel Rogue, MD;  Location: [facility];  Service: Orthopedics;  Laterality:  Left;    KNEE ARTHROPLASTY Right 06/14/2022   Procedure: COMPUTER ASSISTED TOTAL KNEE ARTHROPLASTY;  Surgeon: Fidel Rogue, MD;  Location: [facility];  Service: Orthopedics;  Laterality: Right;  160   TENOTOMY Right 10/14/2014   RT #3, @[facility]    TOTAL HIP ARTHROPLASTY Left 04/28/2020   Procedure: TOTAL HIP ARTHROPLASTY ANTERIOR APPROACH;  Surgeon: Fidel Rogue, MD;  Location: [facility];  Service: Orthopedics;  Laterality: Left;  3E bed   TOTAL HIP ARTHROPLASTY Right 01/25/2021   Procedure: TOTAL HIP ARTHROPLASTY ANTERIOR APPROACH;  Surgeon: Fidel Rogue, MD;  Location: [facility];  Service: Orthopedics;  Laterality: Right;   Patient Active Problem List   Diagnosis Date Noted   Obesity (BMI 30.0-34.9) 10/17/2023   S/P TKR (total knee replacement), right  04/2022 10/17/2023   GERD (gastroesophageal reflux disease) 04/19/2023   Rib pain on right side 02/06/2023   Iron  deficiency anemia 10/16/2021   CKD (chronic kidney disease) stage 3, GFR 30-59 ml/min (HCC) 10/15/2021   RUQ pain 10/12/2021   S/P hip replacement, bilateral 05/30/2021   Pain due to onychomycosis of toenail of right foot 04/25/2021   Osteoarthritis of right hip 01/25/2021   Avascular necrosis of femoral head (HCC) 01/25/2021   Toxic neuropathy  07/08/2020   Osteoarthritis of left hip 02/19/2020   COVID-19 virus infection 04/18/2019   Restless leg syndrome 03/10/2019   Hyperlipidemia 11/27/2018   Anemia 11/25/2018   Nausea 09/17/2018   Right shoulder pain 03/03/2018   Pancreatitis 02/22/2018   Alcohol  abuse 02/22/2018   Cholelithiasis 02/22/2018   Adhesive capsulitis of right shoulder 01/20/2018   Ventral hernia 09/16/2017   Prediabetes 04/16/2017   Spinal stenosis of lumbar region 11/29/2015   Positive urine drug screen 11/16/2015   Umbilical hernia 10/12/2015   Sleeping difficulties 07/15/2015   Bunion, right 06/20/2015   B12 deficiency 03/30/2015   Hammer toe of left foot 02/16/2015   Fibrosis of skin of lower  extremity 02/16/2015   Cervical spondylosis with radiculopathy 03/01/2014   Hammer toe of right foot 01/26/2014   Benign intracranial hypertension 06/18/2013   Porokeratosis 03/31/2013   Vitamin D  deficiency 04/18/2010   MEDIAL MENISCUS TEAR, LEFT 06/30/2008   Current tear knee, medial meniscus 06/30/2008   BARRETTS ESOPHAGUS 06/28/2008   Osteoarthritis of right knee 06/28/2008   HYPERTENSION, BENIGN ESSENTIAL 05/24/2008   Irritable bowel syndrome 05/24/2008   BARIATRIC SURGERY STATUS 03/19/2006   History of malignant neoplasm of large intestine 03/19/1997   ONSET DATE: Referral: 10/29/2023  Surgery 10/31/23; Fall 10/19/33  REFERRING DIAG:   D37.477J (ICD-10-CM) - Closed displaced fracture of distal phalanx of left thumb, initial encounter Needs OT to start 4 weeks s/p left thumb closed reduction with perc pinning on 10/31/23  THERAPY DIAG:  Stiffness of thumb joint  Pain in joint of left hand  Muscle weakness (generalized)  Other lack of coordination  Other symptoms and signs involving the musculoskeletal system  Localized edema  Other disturbances of skin sensation  Scar condition and fibrosis of skin  Rationale for Evaluation and Treatment: Rehabilitation  SUBJECTIVE:   SUBJECTIVE STATEMENT:  Pt reported she still has tenderness and throbbing even in the night along her L thumb.  Pain at its worst is 6/10 and this better than prior to surgery but still aggravating and annoying.  She reports that it still bothers her to pick up things with her hand but her pre fab splint does make it better but it can become too 'heavy'.  Pt brought her splints her daughter bought her.  Pt does reports her R hip or low back pain has been hurting worse lately and it may even cause sharp pains if she takes a deep breath or puts pressure on a certain spot in the low back .  She has an MD appt with her pain DR 02/20/24 at 130 pm.  Pt accompanied by: Self   PERTINENT HISTORY:  Patient is a  63 year old female with a history of MS (pt reports she took meds ~ 10 years but then stopped taking it and has had falls etc since then), hypertension, degenerative disc disease on chronic pain medication with Xtampza  and oxycodone  10 mg daily who presented to the hospital 10/21/23 with pain on the left side of her body after a fall the night before ~9 PM.   Xray results: IMPRESSION: 1. Acute, mildly displaced intra-articular fracture involving the base of the 1st metacarpal with associated mild radial subluxation at the 1st carpometacarpal joint. 2. Mildly displaced intra-articular fracture of the base of the 1st distal phalanx.  PMHx: HTN, IBS, GERD, neuropathy, OA with B hip and R knee replacements, prediabetic, restless leg syndrome, L ring finger injury x 1 year  Pt saw MD 12/30/23. MD note as follows Plan:  She continues to do well postoperatively.  X-rays today demonstrate stable appearance of the thumb metacarpal and distal phalanx fractures with appropriate interval healing.  Can begin to wean from the orthosis as tolerated and resume activities as tolerated.  Follow-up with myself in approximate 4 weeks.   PRECAUTIONS: Fall risk. MD requesting thumb spica with IP immobilized and tip protection recommendation per [place]  Hand Protocol for post op distal phalanx fracture pg 274; see also post op MCP repair pp 258-259  RED FLAGS: None   WEIGHT BEARING RESTRICTIONS: Yes L hand  PAIN:  Yes: NPRS scale: 6/10 Pain location: L thumb all the way down to the radial side of the L wrist Pain description: throbbing & burning Aggravating factors: moving it,  Relieving factors:  HEAT 1-2x/day and pain meds 2x today 6 AM   FALLS: Has patient fallen in last 6 months? Yes. Number of falls 3 - 1 with injury; and recently fell out of the truck trying to close the door with opposite hand but was able to ease herself to the ground   LIVING ENVIRONMENT: Lives with: lives alone and lives with an adult  companion - Pt lives/stays between 2 locations ie) her home and her friends home but his place has stairs, daughters visits regularly - oldest daughter is an [employer] Lives in: House/apartment - was moving between 2 homes (stairs at friends place) Stairs: 5 steps in garage at friends place - this was where she fell Has following equipment at home: Single point cane, Environmental Consultant - 4 wheeled, shower chair, Shower bench, bed side commode, Grab bars, and uses shower chair, uses cane - mostly with longer distances  PLOF: Independent, driving, had foot surgery and was in therapy for this when she fell.  PATIENT GOALS: Wants to use her hand better - put her bra and spanx on herself  NEXT MD VISIT: NA  OBJECTIVE:  Note: Objective measures were completed at Evaluation unless otherwise noted.  HAND DOMINANCE: Right  ADLs: Overall ADLs: Gets help with tight fitting clothes Transfers/ambulation related to ADLs: Mod Ind Eating: gets help cutting food - depends on what it is Grooming: Help with hair Upper body dressing: Help with bra/spanx Lower body dressing: Pt pick socks and shoes she can manage herself Toileting: Ind with some difficulty reaching at times Bathing: Pt's daughter has been helping her get a good shower Tub shower transfers: Mod Ind with shower chair Equipment: Shower seat with back, Grab bars, and bed side commode  FUNCTIONAL OUTCOME MEASURES: Eval: Quick Dash: TBD ~ Educated determination by OTR > 60%  02/18/24: Quick Dash: 43.2%   UPPER EXTREMITY ROM:     Active ROM Right eval Left eval  Shoulder flexion [facility]  Elbow flexion [facility]  Elbow extension    Wrist flexion 60 40  Wrist extension 70 40  Wrist ulnar deviation  Limited  Wrist radial deviation  Limited  Wrist pronation  WFL  Wrist supination  Limited   (Blank rows = not tested)  Active ROM Right eval Left eval  Thumb MCP (0-60)  55  Thumb IP (0-80)  0  Index MCP (0-90)   55  Index PIP (0-100)   65  Index DIP  (0-70)    37  Long MCP (0-90)   55   Long PIP (0-100)   70   Long DIP (0-70)   37   Ring MCP (0-90)   55   Ring PIP (0-100)   78   Ring DIP (  0-70)    37  Little MCP (0-90)    55  Little PIP (0-100)    78  Little DIP (0-70)   37   (Blank rows = not tested)   UPPER EXTREMITY MMT:     MMT Right eval Left eval  Shoulder flexion 4- 4-  Elbow flexion 4 4-  Elbow extension 4 4-  Wrist flexion 4 3-  Wrist extension 4 3-  Wrist pronation 4 4-  Wrist supination 4 2  (Blank rows = not tested)  HAND FUNCTION: Grip strength: Right: TBA lbs; Left: TBA lbs  12/31/23: Right 40.7, 66.3, 71.2 Average 59.4 lbs Left 14.5, 35.9, 41.8 Average 30.7 lbs  01/14/24 Right 63.2, 70.5, 68.5 Left 40.1, 51.5, 52.4 Average Right: 67.4 lbs Left 48 lbs  02/18/24 Right 65.0, 57.5, 67.6 Left 37.6, 45.8, 54.4 Average Right: 63.4 lbs Left: 45.9 lbs  COORDINATION: 12/18/2023: 9 Hole Peg test: Right: 24.29 sec; Left: 26.46 sec  SENSATION: Not tested  EDEMA: Eval: L hand/digits swollen s/p removal of cast today and pins 02/18/24: still swells at base of thumb/webspace  COGNITION: Overall cognitive status: Within functional limits for tasks assessed  OBSERVATIONS: Pt ambulates with no AE and no obvious loss of balance but with abnormal gait pattern s/p B hip and R knee replacements. The pt appears well kept and was concerned about the dry flaking skin around her hand/digits with education to gentle exfoliate skin but not to apply lotion yet as pin sites are not yet healed s/p removal of pins today.   TODAY'S TREATMENT:      - Self Care education and training completed for duration as noted below including: Therapist reviewed joint protection education in response to patient's continued reports of discomfort and joint strain in the L thumb CMC region. Education emphasized strategies to minimize stress on affected joints and promote longevity of hand function. Pt instructed to be careful of water   resistance with aquatics program she continues to participate in. Reviewed QuickDash with significant difficulty still picking up objects and managing lids on containers. - Splint Modifications completed for duration as noted below including: Pt brought her 2 prefab splints that she reports 1 helps but it gets 'heavy' and the other one doesn't seem to fit well.  OTR removed bars from the one splint (palmar and thumb metal stays) and pt reported good improvement in comfort for using her hand but still with adequate support. The 2nd splint had to be heated in the microwave and then molded to her radial wrist/thumb.  Modifications made and pt again reported increased comfort and support.  Encouraged pt to try the softer wrist/thumb splint in the day and the thumb only splint at night.   - Therapeutic exercises completed for duration as noted below including: Reviewed and reinforced therapeutic exercises incorporating joint protection principles, emphasizing gentle resistance and pain-free range ROM exercises including thumb opposition, composite fist formation, and isolated finger flexion/extension to promote tendon glide and functional hand coordination.  Patient demonstrated ability to complete all tasks with good technique and minimal discomfort, requiring occasional cues to reduce grip force and maintain neutral wrist alignment. Retested grip strength with no significant improvement although strongest grips were greater than last tested on 01/14/24 when Averages were as follows Right: 67.4 lbs Left 48 lbs compared to today: Right 65.0, 57.5, 67.6 Left 37.6, 45.8, 54.4  Average Right: 63.4 lbs Left: 45.9 lbs  PATIENT EDUCATION: Education details: Pain management & splint options Person educated: Patient Education method: Explanation,  Demonstration, and Verbal cues Education comprehension: verbalized understanding, returned demonstration, verbal cues required, tactile cues required, and needs further  education  HOME EXERCISE PROGRAM: 11/28/23: Tendon Gliding Exercises 12/05/23: AROM/AAROM exercises 12/31/23: Coordination Activities 01/14/24: Putty Activities 01/24/2024: updated putty HEP Access Code: [email or web address]  GOALS: Goals reviewed with patient? Yes  SHORT TERM GOALS: Target date: 12/20/23  Pt will obtain protective, custom forearm based thumb spica splint/orthotic. Baseline: New to outpt OT Goal status: MET   2.  Pt will demo/state understanding of initial HEP to improve pain levels and prerequisite motion for functional use of left hand/thumb. Baseline: New to outpt OT. Tendon gliding ROM provided at eval. Goal status: MET     LONG TERM GOALS: Target date: 02/07/24   Patient will demonstrate at least 16% improvement with quick Dash score (reporting <27% disability or less) indicating improved functional use of affected extremity.  Baseline: New to outpt OT ~ >63% Goal status: MET and Revised/Continued 02/18/24: Quick Dash: 43.2%   2.  Pt will improve grip strength in left hand from unable to assess to at least 50  lbs for functional use at home and in IADLs. Baseline: New to outpt OT ~ unable to assess Goal status: MET and Revised 12/31/23 Left 14.5, 35.9, 41.8 Average 30.7 lbs 01/14/24 Average Right: 67.4 lbs Left 48 lbs 02/18/24 Average Right: 63.4 lbs Left: 45.9 lbs    3.  Pt will improve A/ROM in left thumb from opposition to tip of index finger on left to at least opposition to PIP (or greater) of left small finger, to have functional motion for tasks like ADLs, reach, and grasp.  Baseline: New to outpt OT ~ No active IP flexion Goal status: MET   4.  Pt will improve left hand functional use from unable to assess to at least Mod I for basic ADLs (ie, brushing her hair, bathing, brushing her teeth and folding laundry) to assist in ability to carry out self-care and higher-level homecare tasks with less difficulty. Baseline: New to outpt OT with family assistance with  bra/spanx Goal status: MET   5.  Pt will improve coordination skills in left hand, as seen by better score on 9 hole peg testing to have increased functional ability to carry out fine motor tasks (fasteners, etc.) and more complex, coordinated IADLs (meal prep, sports, etc.).  Baseline: New to outpt OT - unable to complete due to poor IP flexion Goal status: IN Progress  12/18/23 9 Hole Peg test: Right: 24.29 sec; Left: 26.46 sec   6.  Pt will decrease pain at worst from 5/10 to 2/10 or less to have better sleep and occupational participation in daily roles. Baseline: New to outpt OT Goal status: IN PROGRESS 02/18/24: Worst pain still 6/10  ASSESSMENT:  CLINICAL IMPRESSION:  Patient presents with ongoing limits with functional use of the left thumb following a distal phalanx fracture as well as CMC arthritis.  Patient educated in joint protection principles, splint options, and appropriate progression of hand exercises to support safe functional recovery while minimizing further CMC joint degeneration. This 10th progress note is for dates: 11/28/23 to 02/18/2024. Pt has met 2/2 STGs and 4/6 LTGs - with 2 goals updated to leave 4 remaining goals. Pt making progress towards goals as expected and continues to benefit from skilled OT services in the outpatient setting to work towards remaining goals or until max rehab potential is met.   PERFORMANCE DEFICITS: in functional skills including ADLs, IADLs, coordination, dexterity, proprioception, sensation, edema,  tone, ROM, strength, pain, fascial restrictions, muscle spasms, flexibility, Fine motor control, Gross motor control, mobility, balance, body mechanics, endurance, decreased knowledge of precautions, decreased knowledge of use of DME, wound, skin integrity, and UE functional use, cognitive skills including memory, problem solving, and safety awareness, and psychosocial skills including coping strategies, environmental adaptation, and routines and  behaviors.   IMPAIRMENTS: are limiting patient from ADLs, IADLs, rest and sleep, leisure, and social participation.   COMORBIDITIES: has co-morbidities such as OA that affects occupational performance. Patient will benefit from skilled OT to address above impairments and improve overall function.  PLAN:  OT FREQUENCY: 1-2x/week   OT DURATION: 6 weeks   PLANNED INTERVENTIONS: 97535 self care/ADL training, 02889 therapeutic exercise, 97530 therapeutic activity, 97140 manual therapy, 97035 ultrasound, 97018 paraffin, 02960 fluidotherapy, 97010 moist heat, 97010 cryotherapy, 97763 Orthotics management and training, [id number] Splinting (initial encounter), scar mobilization, passive range of motion, patient/family education, and DME and/or AE instructions  PLAN FOR NEXT SESSION:  15 weeks post-op Progress HEP  Edema control- trial K tape  JOINT PROTECTION Per [place]  Hand Protocol for post op MCP pp 258-259 and distal phalanx fracture pg 274  Clarita LITTIE Pride, OT 02/18/2024, 12:08 PM

## 2024-02-19 ENCOUNTER — Ambulatory Visit: Admitting: Occupational Therapy

## 2024-02-19 DIAGNOSIS — M25649 Stiffness of unspecified hand, not elsewhere classified: Secondary | ICD-10-CM

## 2024-02-19 DIAGNOSIS — R29898 Other symptoms and signs involving the musculoskeletal system: Secondary | ICD-10-CM

## 2024-02-19 DIAGNOSIS — M25542 Pain in joints of left hand: Secondary | ICD-10-CM

## 2024-02-19 DIAGNOSIS — M6281 Muscle weakness (generalized): Secondary | ICD-10-CM

## 2024-02-19 DIAGNOSIS — R278 Other lack of coordination: Secondary | ICD-10-CM

## 2024-02-19 NOTE — Therapy (Signed)
 OUTPATIENT OCCUPATIONAL THERAPY ORTHO TREATMENT   Patient Name: Misty [name] MRN: [id number] DOB:06-12-1960, 63 y.o., female Today's Date: 02/19/2024  PCP: Geofm Glade PARAS, MD REFERRING PROVIDER: Arlinda Buster, MD  END OF SESSION:  OT End of Session - 02/19/24 1454     Visit Number 11    Number of Visits 18    Date for Recertification  04/10/24    Authorization Type [employer] Dual 2025 Covered 100 %    Authorization Time Period NO AUTH REQUIRED VL: [place]    Progress Note Due on Visit 20    OT Start Time 1450    OT Stop Time 1535    OT Time Calculation (min) 45 min    Equipment Utilized During Treatment pre-fab splint; fluido; Ktape    Activity Tolerance Patient tolerated treatment well    Behavior During Therapy WFL for tasks assessed/performed         Past Medical History:  Diagnosis Date   Allergy    Arthritis    Back pain    Colon cancer (HCC)    Depression    Diarrhea    GERD (gastroesophageal reflux disease)    Hypertension    Insomnia    MS (multiple sclerosis) 2000   pt says that she doesn't have it   Other hammer toe (acquired) 03/31/2013   Pneumonia    hx of   Pre-diabetes    Umbilical hernia    Past Surgical History:  Procedure Laterality Date   ABDOMINAL HYSTERECTOMY     ANTERIOR CERVICAL DECOMP/DISCECTOMY FUSION N/A 03/01/2014   Procedure: ANTERIOR CERVICAL DECOMPRESSION/DISCECTOMY FUSION 1 LEVEL;  Surgeon: Victory DELENA Gunnels, MD;  Location: [facility];  Service: Neurosurgery;  Laterality: N/A;  ANTERIOR CERVICAL DECOMPRESSION/DISCECTOMY FUSION 1 LEVEL   BUNIONECTOMY Right 10/14/2014   @[facility]    CESAREAN SECTION     COLON SURGERY     COLONOSCOPY     FOOT SURGERY Right    GASTRIC BYPASS     Hammer Toe Repair Right 10/14/2014   RT #2, @[facility]    INCISION AND DRAINAGE HIP Left 06/02/2020   Procedure: IRRIGATION AND DEBRIDEMENT LEFT HIP, POSSIBLE HEAD BALL AND LINER EXCHANGE;  Surgeon: Fidel Rogue, MD;  Location: [facility];  Service: Orthopedics;  Laterality:  Left;    KNEE ARTHROPLASTY Right 06/14/2022   Procedure: COMPUTER ASSISTED TOTAL KNEE ARTHROPLASTY;  Surgeon: Fidel Rogue, MD;  Location: [facility];  Service: Orthopedics;  Laterality: Right;  160   TENOTOMY Right 10/14/2014   RT #3, @[facility]    TOTAL HIP ARTHROPLASTY Left 04/28/2020   Procedure: TOTAL HIP ARTHROPLASTY ANTERIOR APPROACH;  Surgeon: Fidel Rogue, MD;  Location: [facility];  Service: Orthopedics;  Laterality: Left;  3E bed   TOTAL HIP ARTHROPLASTY Right 01/25/2021   Procedure: TOTAL HIP ARTHROPLASTY ANTERIOR APPROACH;  Surgeon: Fidel Rogue, MD;  Location: [facility];  Service: Orthopedics;  Laterality: Right;   Patient Active Problem List   Diagnosis Date Noted   Obesity (BMI 30.0-34.9) 10/17/2023   S/P TKR (total knee replacement), right  04/2022 10/17/2023   GERD (gastroesophageal reflux disease) 04/19/2023   Rib pain on right side 02/06/2023   Iron  deficiency anemia 10/16/2021   CKD (chronic kidney disease) stage 3, GFR 30-59 ml/min (HCC) 10/15/2021   RUQ pain 10/12/2021   S/P hip replacement, bilateral 05/30/2021   Pain due to onychomycosis of toenail of right foot 04/25/2021   Osteoarthritis of right hip 01/25/2021   Avascular necrosis of femoral head (HCC) 01/25/2021   Toxic neuropathy  07/08/2020   Osteoarthritis of left hip 02/19/2020   COVID-19 virus infection 04/18/2019   Restless leg syndrome 03/10/2019   Hyperlipidemia 11/27/2018   Anemia 11/25/2018   Nausea 09/17/2018   Right shoulder pain 03/03/2018   Pancreatitis 02/22/2018   Alcohol  abuse 02/22/2018   Cholelithiasis 02/22/2018   Adhesive capsulitis of right shoulder 01/20/2018   Ventral hernia 09/16/2017   Prediabetes 04/16/2017   Spinal stenosis of lumbar region 11/29/2015   Positive urine drug screen 11/16/2015   Umbilical hernia 10/12/2015   Sleeping difficulties 07/15/2015   Bunion, right 06/20/2015   B12 deficiency 03/30/2015   Hammer toe of left foot 02/16/2015   Fibrosis of skin of lower  extremity 02/16/2015   Cervical spondylosis with radiculopathy 03/01/2014   Hammer toe of right foot 01/26/2014   Benign intracranial hypertension 06/18/2013   Porokeratosis 03/31/2013   Vitamin D  deficiency 04/18/2010   MEDIAL MENISCUS TEAR, LEFT 06/30/2008   Current tear knee, medial meniscus 06/30/2008   BARRETTS ESOPHAGUS 06/28/2008   Osteoarthritis of right knee 06/28/2008   HYPERTENSION, BENIGN ESSENTIAL 05/24/2008   Irritable bowel syndrome 05/24/2008   BARIATRIC SURGERY STATUS 03/19/2006   History of malignant neoplasm of large intestine 03/19/1997   ONSET DATE: Referral: 10/29/2023  Surgery 10/31/23; Fall 10/19/33  REFERRING DIAG:   D37.477J (ICD-10-CM) - Closed displaced fracture of distal phalanx of left thumb, initial encounter Needs OT to start 4 weeks s/p left thumb closed reduction with perc pinning on 10/31/23  THERAPY DIAG:  Stiffness of thumb joint  Pain in joint of left hand  Muscle weakness (generalized)  Other lack of coordination  Rationale for Evaluation and Treatment: Rehabilitation  SUBJECTIVE:   SUBJECTIVE STATEMENT:  Pt arrived wearing the splint we removed the metal stays from yesterday and reports that it is much more comfortable.  She did arrive using a cane today due to her hip/low back discomfort.  Pt accompanied by: Self   PERTINENT HISTORY:  Patient is a 63 year old female with a history of MS (pt reports she took meds ~ 10 years but then stopped taking it and has had falls etc since then), hypertension, degenerative disc disease on chronic pain medication with Xtampza  and oxycodone  10 mg daily who presented to the hospital 10/21/23 with pain on the left side of her body after a fall the night before ~9 PM.   Xray results: IMPRESSION: 1. Acute, mildly displaced intra-articular fracture involving the base of the 1st metacarpal with associated mild radial subluxation at the 1st carpometacarpal joint. 2. Mildly displaced intra-articular  fracture of the base of the 1st distal phalanx.  PMHx: HTN, IBS, GERD, neuropathy, OA with B hip and R knee replacements, prediabetic, restless leg syndrome, L ring finger injury x 1 year  Pt saw MD 12/30/23. MD note as follows Plan: She continues to do well postoperatively.  X-rays today demonstrate stable appearance of the thumb metacarpal and distal phalanx fractures with appropriate interval healing.  Can begin to wean from the orthosis as tolerated and resume activities as tolerated.  Follow-up with myself in approximate 4 weeks.   PRECAUTIONS: Fall risk. MD requesting thumb spica with IP immobilized and tip protection recommendation per [place]  Hand Protocol for post op distal phalanx fracture pg 274; see also post op MCP repair pp 258-259  RED FLAGS: None   WEIGHT BEARING RESTRICTIONS: Yes L hand  PAIN:  Yes: NPRS scale: 6/10 Pain location: L thumb all the way down to the radial side of the L wrist Pain description:  throbbing & burning Aggravating factors: moving it,  Relieving factors:  HEAT 1-2x/day and pain meds 2x today 6 AM   FALLS: Has patient fallen in last 6 months? Yes. Number of falls 3 - 1 with injury; and recently fell out of the truck trying to close the door with opposite hand but was able to ease herself to the ground   LIVING ENVIRONMENT: Lives with: lives alone and lives with an adult companion - Pt lives/stays between 2 locations ie) her home and her friends home but his place has stairs, daughters visits regularly - oldest daughter is an [employer] Lives in: House/apartment - was moving between 2 homes (stairs at friends place) Stairs: 5 steps in garage at friends place - this was where she fell Has following equipment at home: Single point cane, Environmental Consultant - 4 wheeled, shower chair, Shower bench, bed side commode, Grab bars, and uses shower chair, uses cane - mostly with longer distances  PLOF: Independent, driving, had foot surgery and was in therapy for this when she  fell.  PATIENT GOALS: Wants to use her hand better - put her bra and spanx on herself  NEXT MD VISIT: NA  OBJECTIVE:  Note: Objective measures were completed at Evaluation unless otherwise noted.  HAND DOMINANCE: Right  ADLs: Overall ADLs: Gets help with tight fitting clothes Transfers/ambulation related to ADLs: Mod Ind Eating: gets help cutting food - depends on what it is Grooming: Help with hair Upper body dressing: Help with bra/spanx Lower body dressing: Pt pick socks and shoes she can manage herself Toileting: Ind with some difficulty reaching at times Bathing: Pt's daughter has been helping her get a good shower Tub shower transfers: Mod Ind with shower chair Equipment: Shower seat with back, Grab bars, and bed side commode  FUNCTIONAL OUTCOME MEASURES: Eval: Quick Dash: TBD ~ Educated determination by OTR > 60%  02/18/24: Quick Dash: 43.2%   UPPER EXTREMITY ROM:     Active ROM Right eval Left eval  Shoulder flexion [facility]  Elbow flexion [facility] WFL  Elbow extension    Wrist flexion 60 40  Wrist extension 70 40  Wrist ulnar deviation  Limited  Wrist radial deviation  Limited  Wrist pronation  WFL  Wrist supination  Limited   (Blank rows = not tested)  Active ROM Right eval Left eval  Thumb MCP (0-60)  55  Thumb IP (0-80)  0  Index MCP (0-90)   55  Index PIP (0-100)   65  Index DIP (0-70)    37  Long MCP (0-90)   55   Long PIP (0-100)   70   Long DIP (0-70)   37   Ring MCP (0-90)   55   Ring PIP (0-100)   78   Ring DIP (0-70)    37  Little MCP (0-90)    55  Little PIP (0-100)    78  Little DIP (0-70)   37   (Blank rows = not tested)   UPPER EXTREMITY MMT:     MMT Right eval Left eval  Shoulder flexion 4- 4-  Elbow flexion 4 4-  Elbow extension 4 4-  Wrist flexion 4 3-  Wrist extension 4 3-  Wrist pronation 4 4-  Wrist supination 4 2  (Blank rows = not tested)  HAND FUNCTION: Grip strength: Right: TBA lbs; Left: TBA lbs  12/31/23:  Right 40.7, 66.3, 71.2 Average 59.4 lbs Left 14.5, 35.9, 41.8 Average 30.7 lbs  01/14/24 Right  63.2, 70.5, 68.5 Left 40.1, 51.5, 52.4 Average Right: 67.4 lbs Left 48 lbs  02/18/24 Right 65.0, 57.5, 67.6 Left 37.6, 45.8, 54.4 Average Right: 63.4 lbs Left: 45.9 lbs  COORDINATION: 12/18/2023: 9 Hole Peg test: Right: 24.29 sec; Left: 26.46 sec  SENSATION: Not tested  EDEMA: Eval: L hand/digits swollen s/p removal of cast today and pins 02/18/24: still swells at base of thumb/webspace  COGNITION: Overall cognitive status: Within functional limits for tasks assessed  OBSERVATIONS: Pt ambulates with no AE and no obvious loss of balance but with abnormal gait pattern s/p B hip and R knee replacements. The pt appears well kept and was concerned about the dry flaking skin around her hand/digits with education to gentle exfoliate skin but not to apply lotion yet as pin sites are not yet healed s/p removal of pins today.   TODAY'S TREATMENT:      - Self Care education and training completed for duration as noted below including: Therapist reviewed joint protection education in response to patient's continued reports of discomfort and joint strain in the L thumb CMC region. Education emphasized strategies and adaptive equipment to minimize stress on affected joints and promote longevity of hand function ie) battery operated jar opener, magnetic shoe closures, etc.  Also reviewed options to avoid water  resistance with aquatics program she continues to participate in ie) keeping hands above water  versus moving through water . Applied Kinesiotape over L using supportive technique x 3 pieces -1) thinner piece looped around thumb from webspace around and crossed along radial aspect of wrist, 2) thicker piece straight down radial aspect of thumb and wrist and 3) ticker piece around wrist with no resistance for support and small piece around thumb to keep tape in place - with the aim of providing support and  edema control. Prior to application, patient denied any history of skin irritation or allergy to adhesive. Educated patient on purpose of kinesiotape placement and signs symptoms of allergic reaction or irritation. Cleaned patient's skin and used skin prep. Instructed patient that the tape can be left on up to 3-5 days and can be worn in the shower. Instructed to removed the tape if peeling off, signs of skin irritation arise, or it has been on for >3 days. Informed patient that the tape is best removed in the shower or with a wet wash cloth. Patient stated understanding.   - Therapeutic exercises completed for duration as noted below including: Pt placed BUE in Fluidotherapy machine with supervised ROM x 10 min. Pt was educated to complete gentle ROM during modality time to improve ROM and decrease pain/stiffness of affected extremity by use of the machine's massaging action and thermal properties.  Re instructed pt in gentle tendon glides to improve joint mobility and reduce stiffness associated with arthritis, emphasizing the principle that "motion is lotion" for joint health.   PATIENT EDUCATION: Education details: Pain management & jt protection Person educated: Patient Education method: Explanation, Demonstration, and Verbal cues Education comprehension: verbalized understanding, returned demonstration, verbal cues required, tactile cues required, and needs further education  HOME EXERCISE PROGRAM: 11/28/23: Tendon Gliding Exercises 12/05/23: AROM/AAROM exercises 12/31/23: Coordination Activities 01/14/24: Putty Activities 01/24/2024: updated putty HEP Access Code: [email or web address]  GOALS: Goals reviewed with patient? Yes  SHORT TERM GOALS: Target date: 12/20/23  Pt will obtain protective, custom forearm based thumb spica splint/orthotic. Baseline: New to outpt OT Goal status: MET   2.  Pt will demo/state understanding of initial HEP to improve pain levels and prerequisite motion for  functional  use of left hand/thumb. Baseline: New to outpt OT. Tendon gliding ROM provided at eval. Goal status: MET     LONG TERM GOALS: Target date: 02/07/24   Patient will demonstrate at least 16% improvement with quick Dash score (reporting <27% disability or less) indicating improved functional use of affected extremity.  Baseline: New to outpt OT ~ >63% Goal status: MET and Revised/Continued 02/18/24: Quick Dash: 43.2%   2.  Pt will improve grip strength in left hand from unable to assess to at least 50  lbs for functional use at home and in IADLs. Baseline: New to outpt OT ~ unable to assess Goal status: MET and Revised 12/31/23 Left 14.5, 35.9, 41.8 Average 30.7 lbs 01/14/24 Average Right: 67.4 lbs Left 48 lbs 02/18/24 Average Right: 63.4 lbs Left: 45.9 lbs    3.  Pt will improve A/ROM in left thumb from opposition to tip of index finger on left to at least opposition to PIP (or greater) of left small finger, to have functional motion for tasks like ADLs, reach, and grasp.  Baseline: New to outpt OT ~ No active IP flexion Goal status: MET   4.  Pt will improve left hand functional use from unable to assess to at least Mod I for basic ADLs (ie, brushing her hair, bathing, brushing her teeth and folding laundry) to assist in ability to carry out self-care and higher-level homecare tasks with less difficulty. Baseline: New to outpt OT with family assistance with bra/spanx Goal status: MET   5.  Pt will improve coordination skills in left hand, as seen by better score on 9 hole peg testing to have increased functional ability to carry out fine motor tasks (fasteners, etc.) and more complex, coordinated IADLs (meal prep, sports, etc.).  Baseline: New to outpt OT - unable to complete due to poor IP flexion Goal status: IN Progress  12/18/23 9 Hole Peg test: Right: 24.29 sec; Left: 26.46 sec   6.  Pt will decrease pain at worst from 5/10 to 2/10 or less to have better sleep and occupational  participation in daily roles. Baseline: New to outpt OT Goal status: IN PROGRESS 02/18/24: Worst pain still 6/10  ASSESSMENT:  CLINICAL IMPRESSION:  Patient presents with ongoing limits with functional use of the left thumb following a distal phalanx fracture as well as CMC arthritis.  Patient educated in joint protection principles, splint verus Ktape options, and appropriate participation in hand exercises to support safe functional recovery while minimizing further CMC joint degeneration. Pt making progress towards goals as expected and continues to benefit from skilled OT services in the outpatient setting to work towards remaining goals or until max rehab potential is met.   PERFORMANCE DEFICITS: in functional skills including ADLs, IADLs, coordination, dexterity, proprioception, sensation, edema, tone, ROM, strength, pain, fascial restrictions, muscle spasms, flexibility, Fine motor control, Gross motor control, mobility, balance, body mechanics, endurance, decreased knowledge of precautions, decreased knowledge of use of DME, wound, skin integrity, and UE functional use, cognitive skills including memory, problem solving, and safety awareness, and psychosocial skills including coping strategies, environmental adaptation, and routines and behaviors.   IMPAIRMENTS: are limiting patient from ADLs, IADLs, rest and sleep, leisure, and social participation.   COMORBIDITIES: has co-morbidities such as OA that affects occupational performance. Patient will benefit from skilled OT to address above impairments and improve overall function.  PLAN:  OT FREQUENCY: 1-2x/week   OT DURATION: 6 weeks   PLANNED INTERVENTIONS: 97535 self care/ADL training, 02889 therapeutic  exercise, 97530 therapeutic activity, 97140 manual therapy, 97035 ultrasound, 02981 paraffin, 97039 fluidotherapy, 97010 moist heat, 97010 cryotherapy, 97763 Orthotics management and training, [phone] Splinting (initial encounter), scar  mobilization, passive range of motion, patient/family education, and DME and/or AE instructions  PLAN FOR NEXT SESSION:  15+ weeks post-op Progress HEP  Edema control- trialled K tape 02/19/24 JOINT PROTECTION Per [place]  Hand Protocol for post op MCP pp 258-259 and distal phalanx fracture pg 274  Clarita LITTIE Pride, OT 02/19/2024, 2:56 PM

## 2024-02-20 ENCOUNTER — Other Ambulatory Visit: Payer: Self-pay | Admitting: Internal Medicine

## 2024-02-26 ENCOUNTER — Ambulatory Visit: Payer: Self-pay | Admitting: Occupational Therapy

## 2024-02-27 ENCOUNTER — Ambulatory Visit (INDEPENDENT_AMBULATORY_CARE_PROVIDER_SITE_OTHER)

## 2024-02-27 DIAGNOSIS — E538 Deficiency of other specified B group vitamins: Secondary | ICD-10-CM

## 2024-02-27 MED ORDER — CYANOCOBALAMIN 1000 MCG/ML IJ SOLN
1000.0000 ug | Freq: Once | INTRAMUSCULAR | Status: AC
  Administered 2024-02-27: 1000 ug via INTRAMUSCULAR

## 2024-02-27 NOTE — Progress Notes (Signed)
 Patient visits today for their b-12 injection. Patient informed of what they had received and tolerated the injection well. Patient notified to reach out to the office if needed.

## 2024-03-04 ENCOUNTER — Ambulatory Visit: Payer: Self-pay | Admitting: Occupational Therapy

## 2024-03-04 DIAGNOSIS — M25649 Stiffness of unspecified hand, not elsewhere classified: Secondary | ICD-10-CM

## 2024-03-04 DIAGNOSIS — M6281 Muscle weakness (generalized): Secondary | ICD-10-CM

## 2024-03-04 DIAGNOSIS — M25542 Pain in joints of left hand: Secondary | ICD-10-CM

## 2024-03-04 DIAGNOSIS — R208 Other disturbances of skin sensation: Secondary | ICD-10-CM

## 2024-03-04 DIAGNOSIS — R29898 Other symptoms and signs involving the musculoskeletal system: Secondary | ICD-10-CM

## 2024-03-04 DIAGNOSIS — R278 Other lack of coordination: Secondary | ICD-10-CM

## 2024-03-04 NOTE — Therapy (Signed)
 OUTPATIENT OCCUPATIONAL THERAPY ORTHO TREATMENT   Patient Name: Misty [name] MRN: [id number] DOB:02/20/1961, 63 y.o., female Today's Date: 03/04/2024  PCP: Geofm Glade PARAS, MD REFERRING PROVIDER: Arlinda Buster, MD  END OF SESSION:  OT End of Session - 03/04/24 1406     Visit Number 12    Number of Visits 18    Date for Recertification  04/10/24    Authorization Type [employer] Dual 2025 Covered 100 %    Authorization Time Period NO AUTH REQUIRED VL: [place]    Progress Note Due on Visit 20    OT Start Time 1406    OT Stop Time 1445    OT Time Calculation (min) 39 min    Equipment Utilized During Treatment pre-fab splint, testing material    Activity Tolerance Patient tolerated treatment well    Behavior During Therapy WFL for tasks assessed/performed         Past Medical History:  Diagnosis Date   Allergy    Arthritis    Back pain    Colon cancer (HCC)    Depression    Diarrhea    GERD (gastroesophageal reflux disease)    Hypertension    Insomnia    MS (multiple sclerosis) 2000   pt says that she doesn't have it   Other hammer toe (acquired) 03/31/2013   Pneumonia    hx of   Pre-diabetes    Umbilical hernia    Past Surgical History:  Procedure Laterality Date   ABDOMINAL HYSTERECTOMY     ANTERIOR CERVICAL DECOMP/DISCECTOMY FUSION N/A 03/01/2014   Procedure: ANTERIOR CERVICAL DECOMPRESSION/DISCECTOMY FUSION 1 LEVEL;  Surgeon: Victory DELENA Gunnels, MD;  Location: [facility];  Service: Neurosurgery;  Laterality: N/A;  ANTERIOR CERVICAL DECOMPRESSION/DISCECTOMY FUSION 1 LEVEL   BUNIONECTOMY Right 10/14/2014   @[facility]    CESAREAN SECTION     COLON SURGERY     COLONOSCOPY     FOOT SURGERY Right    GASTRIC BYPASS     Hammer Toe Repair Right 10/14/2014   RT #2, @[facility]    INCISION AND DRAINAGE HIP Left 06/02/2020   Procedure: IRRIGATION AND DEBRIDEMENT LEFT HIP, POSSIBLE HEAD BALL AND LINER EXCHANGE;  Surgeon: Fidel Rogue, MD;  Location: [facility];  Service: Orthopedics;  Laterality:  Left;    KNEE ARTHROPLASTY Right 06/14/2022   Procedure: COMPUTER ASSISTED TOTAL KNEE ARTHROPLASTY;  Surgeon: Fidel Rogue, MD;  Location: [facility];  Service: Orthopedics;  Laterality: Right;  160   TENOTOMY Right 10/14/2014   RT #3, @[facility]    TOTAL HIP ARTHROPLASTY Left 04/28/2020   Procedure: TOTAL HIP ARTHROPLASTY ANTERIOR APPROACH;  Surgeon: Fidel Rogue, MD;  Location: [facility];  Service: Orthopedics;  Laterality: Left;  3E bed   TOTAL HIP ARTHROPLASTY Right 01/25/2021   Procedure: TOTAL HIP ARTHROPLASTY ANTERIOR APPROACH;  Surgeon: Fidel Rogue, MD;  Location: [facility];  Service: Orthopedics;  Laterality: Right;   Patient Active Problem List   Diagnosis Date Noted   Obesity (BMI 30.0-34.9) 10/17/2023   S/P TKR (total knee replacement), right  04/2022 10/17/2023   GERD (gastroesophageal reflux disease) 04/19/2023   Rib pain on right side 02/06/2023   Iron  deficiency anemia 10/16/2021   CKD (chronic kidney disease) stage 3, GFR 30-59 ml/min (HCC) 10/15/2021   RUQ pain 10/12/2021   S/P hip replacement, bilateral 05/30/2021   Pain due to onychomycosis of toenail of right foot 04/25/2021   Osteoarthritis of right hip 01/25/2021   Avascular necrosis of femoral head (HCC) 01/25/2021   Toxic neuropathy  07/08/2020   Osteoarthritis of left hip 02/19/2020   COVID-19 virus infection 04/18/2019   Restless leg syndrome 03/10/2019   Hyperlipidemia 11/27/2018   Anemia 11/25/2018   Nausea 09/17/2018   Right shoulder pain 03/03/2018   Pancreatitis 02/22/2018   Alcohol  abuse 02/22/2018   Cholelithiasis 02/22/2018   Adhesive capsulitis of right shoulder 01/20/2018   Ventral hernia 09/16/2017   Prediabetes 04/16/2017   Spinal stenosis of lumbar region 11/29/2015   Positive urine drug screen 11/16/2015   Umbilical hernia 10/12/2015   Sleeping difficulties 07/15/2015   Bunion, right 06/20/2015   B12 deficiency 03/30/2015   Hammer toe of left foot 02/16/2015   Fibrosis of skin of lower  extremity 02/16/2015   Cervical spondylosis with radiculopathy 03/01/2014   Hammer toe of right foot 01/26/2014   Benign intracranial hypertension 06/18/2013   Porokeratosis 03/31/2013   Vitamin D  deficiency 04/18/2010   MEDIAL MENISCUS TEAR, LEFT 06/30/2008   Current tear knee, medial meniscus 06/30/2008   BARRETTS ESOPHAGUS 06/28/2008   Osteoarthritis of right knee 06/28/2008   HYPERTENSION, BENIGN ESSENTIAL 05/24/2008   Irritable bowel syndrome 05/24/2008   BARIATRIC SURGERY STATUS 03/19/2006   History of malignant neoplasm of large intestine 03/19/1997   ONSET DATE: Referral: 10/29/2023  Surgery 10/31/23; Fall 10/19/33  REFERRING DIAG:   D37.477J (ICD-10-CM) - Closed displaced fracture of distal phalanx of left thumb, initial encounter Needs OT to start 4 weeks s/p left thumb closed reduction with perc pinning on 10/31/23  THERAPY DIAG:  Stiffness of thumb joint  Pain in joint of left hand  Muscle weakness (generalized)  Other lack of coordination  Other symptoms and signs involving the musculoskeletal system  Other disturbances of skin sensation  Rationale for Evaluation and Treatment: Rehabilitation  SUBJECTIVE:   SUBJECTIVE STATEMENT:  Pt reports her worst pains have been around ~ 5/10 and the numbness that used to be in her whole hand is just in her thumb now.  Pt got an injection last week an in her back but it only seems to have lasted a couple of weeks.  Pt did arrive without a walker today and did have her splint on her L hand.  Pt accompanied by: Self   PERTINENT HISTORY:  Patient is a 63 year old female with a history of MS (pt reports she took meds ~ 10 years but then stopped taking it and has had falls etc since then), hypertension, degenerative disc disease on chronic pain medication with Xtampza  and oxycodone  10 mg daily who presented to the hospital 10/21/23 with pain on the left side of her body after a fall the night before ~9 PM.   Xray  results: IMPRESSION: 1. Acute, mildly displaced intra-articular fracture involving the base of the 1st metacarpal with associated mild radial subluxation at the 1st carpometacarpal joint. 2. Mildly displaced intra-articular fracture of the base of the 1st distal phalanx.  PMHx: HTN, IBS, GERD, neuropathy, OA with B hip and R knee replacements, prediabetic, restless leg syndrome, L ring finger injury x 1 year  Pt saw MD 12/30/23. MD note as follows Plan: She continues to do well postoperatively.  X-rays today demonstrate stable appearance of the thumb metacarpal and distal phalanx fractures with appropriate interval healing.  Can begin to wean from the orthosis as tolerated and resume activities as tolerated.  Follow-up with myself in approximate 4 weeks.   PRECAUTIONS: Fall risk. MD requesting thumb spica with IP immobilized and tip protection recommendation per [place]  Hand Protocol for post op distal phalanx fracture  pg 274; see also post op MCP repair pp 258-259  RED FLAGS: None   WEIGHT BEARING RESTRICTIONS: Yes L hand  PAIN:  Yes: NPRS scale: <5/10 Pain location: L thumb /radial side of the L wrist Pain description: aching Aggravating factors: moving it,  Relieving factors:  HEAT 1-2x/day and pain meds 2x today 6 AM   FALLS: Has patient fallen in last 6 months? Yes. Number of falls 3 - 1 with injury; and recently fell out of the truck trying to close the door with opposite hand but was able to ease herself to the ground   LIVING ENVIRONMENT: Lives with: lives alone and lives with an adult companion - Pt lives/stays between 2 locations ie) her home and her friends home but his place has stairs, daughters visits regularly - oldest daughter is an [employer] Lives in: House/apartment - was moving between 2 homes (stairs at friends place) Stairs: 5 steps in garage at friends place - this was where she fell Has following equipment at home: Single point cane, Environmental Consultant - 4 wheeled, shower chair,  Shower bench, bed side commode, Grab bars, and uses shower chair, uses cane - mostly with longer distances  PLOF: Independent, driving, had foot surgery and was in therapy for this when she fell.  PATIENT GOALS: Wants to use her hand better - put her bra and spanx on herself  NEXT MD VISIT: NA  OBJECTIVE:  Note: Objective measures were completed at Evaluation unless otherwise noted.  HAND DOMINANCE: Right  ADLs: Overall ADLs: Gets help with tight fitting clothes Transfers/ambulation related to ADLs: Mod Ind Eating: gets help cutting food - depends on what it is Grooming: Help with hair Upper body dressing: Help with bra/spanx Lower body dressing: Pt pick socks and shoes she can manage herself Toileting: Ind with some difficulty reaching at times Bathing: Pt's daughter has been helping her get a good shower Tub shower transfers: Mod Ind with shower chair Equipment: Shower seat with back, Grab bars, and bed side commode  FUNCTIONAL OUTCOME MEASURES: Eval: Quick Dash: TBD ~ Educated determination by OTR > 60%  02/18/24: Quick Dash: 43.2%   UPPER EXTREMITY ROM:     Active ROM Right eval Left eval  Shoulder flexion [facility]  Elbow flexion [facility]  Elbow extension    Wrist flexion 60 40  Wrist extension 70 40  Wrist ulnar deviation  Limited  Wrist radial deviation  Limited  Wrist pronation  WFL  Wrist supination  Limited   (Blank rows = not tested)  Active ROM Right eval Left eval  Thumb MCP (0-60)  55  Thumb IP (0-80)  0  Index MCP (0-90)   55  Index PIP (0-100)   65  Index DIP (0-70)    37  Long MCP (0-90)   55   Long PIP (0-100)   70   Long DIP (0-70)   37   Ring MCP (0-90)   55   Ring PIP (0-100)   78   Ring DIP (0-70)    37  Little MCP (0-90)    55  Little PIP (0-100)    78  Little DIP (0-70)   37   (Blank rows = not tested)   UPPER EXTREMITY MMT:     MMT Right eval Left eval  Shoulder flexion 4- 4-  Elbow flexion 4 4-  Elbow extension 4 4-   Wrist flexion 4 3-  Wrist extension 4 3-  Wrist pronation 4 4-  Wrist  supination 4 2  (Blank rows = not tested)  HAND FUNCTION: Grip strength: Right: TBA lbs; Left: TBA lbs  12/31/23: Right 40.7, 66.3, 71.2 Average 59.4 lbs Left 14.5, 35.9, 41.8 Average 30.7 lbs  01/14/24 Right 63.2, 70.5, 68.5 Left 40.1, 51.5, 52.4 Average Right: 67.4 lbs Left 48 lbs  02/18/24 Right 65.0, 57.5, 67.6 Left 37.6, 45.8, 54.4 Average Right: 63.4 lbs Left: 45.9 lbs  03/04/24 Right 68.1, 74.0, 73.6 Left 54.4, 67.4, 69.4 Average Right: 71.9 lbs; Left 63.7 lbs  COORDINATION: 12/18/2023: 9 Hole Peg test: Right: 24.29 sec; Left: 26.46 sec  SENSATION: Not tested  EDEMA: Eval: L hand/digits swollen s/p removal of cast today and pins 02/18/24: still swells at base of thumb/webspace  COGNITION: Overall cognitive status: Within functional limits for tasks assessed  OBSERVATIONS: Pt ambulates with no AE and no obvious loss of balance but with abnormal gait pattern s/p B hip and R knee replacements. The pt appears well kept and was concerned about the dry flaking skin around her hand/digits with education to gentle exfoliate skin but not to apply lotion yet as pin sites are not yet healed s/p removal of pins today.   TODAY'S TREATMENT:      - Self Care education and training completed for duration as noted below including: Therapist reviewed goals with patient and updated patient progression.  No additional functional limitations identified. Pt's grip strength improved bilaterally over past 2 weeks 02/18/24 Average Right: 63.4 lbs Left: 45.9 lbs compared to today Average Right: 71.9 lbs; Left 63.7 lbs OT reviewed patients current symptoms and progress since fracture healing. Education was provided that ongoing thumb discomfort is more consistent with joint-related irritation (e.g., CMC joint mechanics) rather than late effects of the previously healed thumb fractures, which demonstrate adequate healing. OT  discussed how joint positioning, load during pinch/grasp, and repetitive use may contribute to symptoms and reinforced strategies to modify activities to reduce stress at the thumb base.  Pt is using her soft thumb spica/wrist splint regularly with improved comfort.  OT educated provided further formal education on joint protection principles as noted in pt instructions as needed to improve UE pain.  Handout is provided regarding joint protection and patient is encouraged to consider the specific acronym LESS ie) less strain on joints  L: Listen to your body E: Energy Conservation S: Stronger Joints take the Lead S: Strategize  Patient encouraged to protect hands and wrist by  - Respecting for Pain and stopping activities before they reach the point of discomfort or pain  - Rest and Work Balance ie) balancing activities with appropriate rests during activity - Reduction of Effort - Use two hands instead of one if possible  - Use of Larger/Stronger Joints Ie) Lift or carry with the forearm or shoulder rather than fingers - Avoid Activities That Cannot Be Stopped - Use of Assistive Equipment - Consider splint use and AE equipment to protect joints from deformity and stresses Then reviewed activities that can be modified with adaptive equipment and encourage patient to look adaptive equipment for arthritis [i.e. to consider joint protection].  Pt demonstrating carryover of prior joint protection education by accurately reporting multiple adaptive equipment (AE) items she has independently obtained and implemented at home, including a Swiffer for reduced hand strain during floor cleaning, a mop bucket with foot-pedal wringer to minimize gripping and squeezing demands, and a long-handled duster to decrease repetitive hand use. Pt also reported increased activity modification, including declining cooking and baking tasks that place higher  stress on her hands and reducing resisted hand motions during pool  activities, indicating improved awareness of symptom management and joint protection principles. PATIENT EDUCATION: Education details: Primary school teacher Person educated: Patient Education method: Explanation, Demonstration, Verbal cues, and Handouts Education comprehension: verbalized understanding, returned demonstration, verbal cues required, tactile cues required, and needs further education  HOME EXERCISE PROGRAM: 11/28/23: Tendon Gliding Exercises 12/05/23: AROM/AAROM exercises 12/31/23: Coordination Activities 01/14/24: Putty Activities 01/24/2024: updated putty HEP Access Code: [email or web address] 03/04/24: Joint Protection handouts  GOALS: Goals reviewed with patient? Yes  SHORT TERM GOALS: Target date: 12/20/23  Pt will obtain protective, custom forearm based thumb spica splint/orthotic. Baseline: New to outpt OT Goal status: MET   2.  Pt will demo/state understanding of initial HEP to improve pain levels and prerequisite motion for functional use of left hand/thumb. Baseline: New to outpt OT. Tendon gliding ROM provided at eval. Goal status: MET     LONG TERM GOALS: Target date: 02/07/24   Patient will demonstrate at least 16% improvement with quick Dash score (reporting <27% disability or less) indicating improved functional use of affected extremity.  Baseline: New to outpt OT ~ >63% Goal status: MET and Revised/Continued 02/18/24: Quick Dash: 43.2%   2.  Pt will improve grip strength in left hand from unable to assess to at least 50  lbs for functional use at home and in IADLs. Baseline: New to outpt OT ~ unable to assess Goal status: MET and Revised 12/31/23 Left 14.5, 35.9, 41.8 Average 30.7 lbs 01/14/24 Average Right: 67.4 lbs Left 48 lbs 02/18/24 Average Right: 63.4 lbs Left: 45.9 lbs 03/04/24 Average Right: 71.9 lbs; Left 63.7 lbs   3.  Pt will improve A/ROM in left thumb from opposition to tip of index finger on left to at least opposition to PIP (or greater) of left  small finger, to have functional motion for tasks like ADLs, reach, and grasp.  Baseline: New to outpt OT ~ No active IP flexion Goal status: MET   4.  Pt will improve left hand functional use from unable to assess to at least Mod I for basic ADLs (ie, brushing her hair, bathing, brushing her teeth and folding laundry) to assist in ability to carry out self-care and higher-level homecare tasks with less difficulty. Baseline: New to outpt OT with family assistance with bra/spanx Goal status: MET   5.  Pt will improve coordination skills in left hand, as seen by better score on 9 hole peg testing to have increased functional ability to carry out fine motor tasks (fasteners, etc.) and more complex, coordinated IADLs (meal prep, sports, etc.).  Baseline: New to outpt OT - unable to complete due to poor IP flexion Goal status: IN Progress  12/18/23 9 Hole Peg test: Right: 24.29 sec; Left: 26.46 sec 03/04/24: Utilizing more AE at home   6.  Pt will decrease pain at worst from 5/10 to 2/10 or less to have better sleep and occupational participation in daily roles. Baseline: New to outpt OT Goal status: IN PROGRESS 02/18/24: Worst pain still 6/10  ASSESSMENT:  CLINICAL IMPRESSION:  Patient presents with ongoing limits with functional use of the left thumb following a distal phalanx fracture as well as CMC arthritis.  Patient received education on joint protection strategies and is demonstrating appropriate carryover through activity modification, use of adaptive equipment, and adherence to hand exercise recommendations. These strategies are supporting safe functional performance while reducing stress to the [facility] joints and minimizing risk of symptom exacerbation.  Pt  making progress towards goals as expected and continues to benefit from skilled OT services in the outpatient setting with plans to conduct another visit in the new year to ensure appropriate progress through the holiday season.    PERFORMANCE DEFICITS: in functional skills including ADLs, IADLs, coordination, dexterity, proprioception, sensation, edema, tone, ROM, strength, pain, fascial restrictions, muscle spasms, flexibility, Fine motor control, Gross motor control, mobility, balance, body mechanics, endurance, decreased knowledge of precautions, decreased knowledge of use of DME, wound, skin integrity, and UE functional use, cognitive skills including memory, problem solving, and safety awareness, and psychosocial skills including coping strategies, environmental adaptation, and routines and behaviors.   IMPAIRMENTS: are limiting patient from ADLs, IADLs, rest and sleep, leisure, and social participation.   COMORBIDITIES: has co-morbidities such as OA that affects occupational performance. Patient will benefit from skilled OT to address above impairments and improve overall function.  PLAN:  OT FREQUENCY: 1-2x/week   OT DURATION: 6 weeks   PLANNED INTERVENTIONS: 97535 self care/ADL training, 02889 therapeutic exercise, 97530 therapeutic activity, 97140 manual therapy, 97035 ultrasound, 97018 paraffin, 02960 fluidotherapy, 97010 moist heat, 97010 cryotherapy, 97763 Orthotics management and training, 02239 Splinting (initial encounter), scar mobilization, passive range of motion, patient/family education, and DME and/or AE instructions  PLAN FOR NEXT SESSION:   Probable DC visit - check joint protection carryover during holidays, strength/HEP and splint off schedule  Clarita LITTIE Pride, OT 03/04/2024, 3:14 PM

## 2024-03-04 NOTE — Patient Instructions (Signed)
 Misty [name]

## 2024-03-10 ENCOUNTER — Other Ambulatory Visit: Payer: Self-pay | Admitting: Internal Medicine

## 2024-03-20 ENCOUNTER — Other Ambulatory Visit: Payer: Self-pay | Admitting: Internal Medicine

## 2024-03-23 ENCOUNTER — Ambulatory Visit: Attending: Orthopedic Surgery | Admitting: Occupational Therapy

## 2024-03-23 DIAGNOSIS — M6281 Muscle weakness (generalized): Secondary | ICD-10-CM | POA: Insufficient documentation

## 2024-03-23 DIAGNOSIS — R208 Other disturbances of skin sensation: Secondary | ICD-10-CM | POA: Diagnosis present

## 2024-03-23 DIAGNOSIS — R278 Other lack of coordination: Secondary | ICD-10-CM | POA: Insufficient documentation

## 2024-03-23 DIAGNOSIS — M25542 Pain in joints of left hand: Secondary | ICD-10-CM | POA: Diagnosis present

## 2024-03-23 NOTE — Therapy (Signed)
 " OUTPATIENT OCCUPATIONAL THERAPY ORTHO TREATMENT & DISCHARGE SUMMARY  Patient Name: Misty [name] MRN: [id number] DOB:1961/03/17, 64 y.o., female Today's Date: 03/23/2024  PCP: Geofm Glade PARAS, MD REFERRING PROVIDER: Arlinda Buster, MD  END OF SESSION:  OT End of Session - 03/23/24 1448     Visit Number 13    Number of Visits 18    Date for Recertification  04/10/24    Authorization Type [employer] Dual 2025 Covered 100 %    Authorization Time Period NO AUTH REQUIRED VL: [place]    Progress Note Due on Visit 20    OT Start Time 1449    OT Stop Time 1544    OT Time Calculation (min) 55 min    Equipment Utilized During Treatment Fluidotherapy, testing material    Activity Tolerance Patient tolerated treatment well    Behavior During Therapy WFL for tasks assessed/performed         Past Medical History:  Diagnosis Date   Allergy    Arthritis    Back pain    Colon cancer (HCC)    Depression    Diarrhea    GERD (gastroesophageal reflux disease)    Hypertension    Insomnia    MS (multiple sclerosis) 2000   pt says that she doesn't have it   Other hammer toe (acquired) 03/31/2013   Pneumonia    hx of   Pre-diabetes    Umbilical hernia    Past Surgical History:  Procedure Laterality Date   ABDOMINAL HYSTERECTOMY     ANTERIOR CERVICAL DECOMP/DISCECTOMY FUSION N/A 03/01/2014   Procedure: ANTERIOR CERVICAL DECOMPRESSION/DISCECTOMY FUSION 1 LEVEL;  Surgeon: Victory DELENA Gunnels, MD;  Location: [facility];  Service: Neurosurgery;  Laterality: N/A;  ANTERIOR CERVICAL DECOMPRESSION/DISCECTOMY FUSION 1 LEVEL   BUNIONECTOMY Right 10/14/2014   @[facility]    CESAREAN SECTION     COLON SURGERY     COLONOSCOPY     FOOT SURGERY Right    GASTRIC BYPASS     Hammer Toe Repair Right 10/14/2014   RT #2, @[facility]    INCISION AND DRAINAGE HIP Left 06/02/2020   Procedure: IRRIGATION AND DEBRIDEMENT LEFT HIP, POSSIBLE HEAD BALL AND LINER EXCHANGE;  Surgeon: Fidel Rogue, MD;  Location: [facility];  Service:  Orthopedics;  Laterality: Left;    KNEE ARTHROPLASTY Right 06/14/2022   Procedure: COMPUTER ASSISTED TOTAL KNEE ARTHROPLASTY;  Surgeon: Fidel Rogue, MD;  Location: [facility];  Service: Orthopedics;  Laterality: Right;  160   TENOTOMY Right 10/14/2014   RT #3, @[facility]    TOTAL HIP ARTHROPLASTY Left 04/28/2020   Procedure: TOTAL HIP ARTHROPLASTY ANTERIOR APPROACH;  Surgeon: Fidel Rogue, MD;  Location: [facility];  Service: Orthopedics;  Laterality: Left;  3E bed   TOTAL HIP ARTHROPLASTY Right 01/25/2021   Procedure: TOTAL HIP ARTHROPLASTY ANTERIOR APPROACH;  Surgeon: Fidel Rogue, MD;  Location: [facility];  Service: Orthopedics;  Laterality: Right;   Patient Active Problem List   Diagnosis Date Noted   Obesity (BMI 30.0-34.9) 10/17/2023   S/P TKR (total knee replacement), right  04/2022 10/17/2023   GERD (gastroesophageal reflux disease) 04/19/2023   Rib pain on right side 02/06/2023   Iron  deficiency anemia 10/16/2021   CKD (chronic kidney disease) stage 3, GFR 30-59 ml/min (HCC) 10/15/2021   RUQ pain 10/12/2021   S/P hip replacement, bilateral 05/30/2021   Pain due to onychomycosis of toenail of right foot 04/25/2021   Osteoarthritis of right hip 01/25/2021   Avascular necrosis of femoral head (HCC) 01/25/2021  Toxic neuropathy 07/08/2020   Osteoarthritis of left hip 02/19/2020   COVID-19 virus infection 04/18/2019   Restless leg syndrome 03/10/2019   Hyperlipidemia 11/27/2018   Anemia 11/25/2018   Nausea 09/17/2018   Right shoulder pain 03/03/2018   Pancreatitis 02/22/2018   Alcohol  abuse 02/22/2018   Cholelithiasis 02/22/2018   Adhesive capsulitis of right shoulder 01/20/2018   Ventral hernia 09/16/2017   Prediabetes 04/16/2017   Spinal stenosis of lumbar region 11/29/2015   Positive urine drug screen 11/16/2015   Umbilical hernia 10/12/2015   Sleeping difficulties 07/15/2015   Bunion, right 06/20/2015   B12 deficiency 03/30/2015   Hammer toe of left foot 02/16/2015    Fibrosis of skin of lower extremity 02/16/2015   Cervical spondylosis with radiculopathy 03/01/2014   Hammer toe of right foot 01/26/2014   Benign intracranial hypertension 06/18/2013   Porokeratosis 03/31/2013   Vitamin D  deficiency 04/18/2010   MEDIAL MENISCUS TEAR, LEFT 06/30/2008   Current tear knee, medial meniscus 06/30/2008   BARRETTS ESOPHAGUS 06/28/2008   Osteoarthritis of right knee 06/28/2008   HYPERTENSION, BENIGN ESSENTIAL 05/24/2008   Irritable bowel syndrome 05/24/2008   BARIATRIC SURGERY STATUS 03/19/2006   History of malignant neoplasm of large intestine 03/19/1997   ONSET DATE: Referral: 10/29/2023  Surgery 10/31/23; Fall 10/19/33  REFERRING DIAG:   D37.477J (ICD-10-CM) - Closed displaced fracture of distal phalanx of left thumb, initial encounter Needs OT to start 4 weeks s/p left thumb closed reduction with perc pinning on 10/31/23  THERAPY DIAG:  Muscle weakness (generalized)  Other lack of coordination  Pain in joint of left hand  Other disturbances of skin sensation  Rationale for Evaluation and Treatment: Rehabilitation  SUBJECTIVE:   SUBJECTIVE STATEMENT:  Pt had a pain management appt today prior to OT today and reported some ongoing changes to medication.  Pt gets up at 5 AM to take her meds ie) gabapentin  and pain meds to help with her early AM stiffness and discomfort.  Pt continues to report her worst pain are still around ~ 5/10 but more related to neuropathy than hand injury.  Pt did arrive without a walker today.  Pt reports taking it easy over the holidays and she has been taking care of her hands and doing her exercises.   Pt accompanied by: Self   PERTINENT HISTORY:  Patient is a 64 year old female with a history of MS (pt reports she took meds ~ 10 years but then stopped taking it and has had falls etc since then), hypertension, degenerative disc disease on chronic pain medication with Xtampza  and oxycodone  10 mg daily who presented to the  hospital 10/21/23 with pain on the left side of her body after a fall the night before ~9 PM.   Xray results: IMPRESSION: 1. Acute, mildly displaced intra-articular fracture involving the base of the 1st metacarpal with associated mild radial subluxation at the 1st carpometacarpal joint. 2. Mildly displaced intra-articular fracture of the base of the 1st distal phalanx.  PMHx: HTN, IBS, GERD, neuropathy, OA with B hip and R knee replacements, prediabetic, restless leg syndrome, L ring finger injury x 1 year  Pt saw MD 12/30/23. MD note as follows Plan: She continues to do well postoperatively.  X-rays today demonstrate stable appearance of the thumb metacarpal and distal phalanx fractures with appropriate interval healing.  Can begin to wean from the orthosis as tolerated and resume activities as tolerated.  Follow-up with myself in approximate 4 weeks.   PRECAUTIONS: Fall risk. MD requesting thumb spica  with IP immobilized and tip protection recommendation per [place]  Hand Protocol for post op distal phalanx fracture pg 274; see also post op MCP repair pp [phone]  RED FLAGS: None   WEIGHT BEARING RESTRICTIONS: Yes L hand  PAIN:  Yes: NPRS scale: ~5/10 upon arrival and 1-2 s/p fluidotherapy Pain location: L thumb /radial side of the L wrist Pain description: aching Aggravating factors: moving it,  Relieving factors:  HEAT 1-2x/day, water  and pain meds    FALLS: Has patient fallen in last 6 months? Yes. Number of falls 3 - 1 with injury; and recently fell out of the truck trying to close the door with opposite hand but was able to ease herself to the ground   LIVING ENVIRONMENT: Lives with: lives alone and lives with an adult companion - Pt lives/stays between 2 locations ie) her home and her friends home but his place has stairs, daughters visits regularly - oldest daughter is an [employer] Lives in: House/apartment - was moving between 2 homes (stairs at friends place) Stairs: 5 steps in  garage at friends place - this was where she fell Has following equipment at home: Single point cane, Environmental Consultant - 4 wheeled, shower chair, Shower bench, bed side commode, Grab bars, and uses shower chair, uses cane - mostly with longer distances  PLOF: Independent, driving, had foot surgery and was in therapy for this when she fell.  PATIENT GOALS: Wants to use her hand better - put her bra and spanx on herself  NEXT MD VISIT: NA  OBJECTIVE:  Note: Objective measures were completed at Evaluation unless otherwise noted.  HAND DOMINANCE: Right  ADLs: Overall ADLs: Gets help with tight fitting clothes Transfers/ambulation related to ADLs: Mod Ind Eating: gets help cutting food - depends on what it is Grooming: Help with hair Upper body dressing: Help with bra/spanx Lower body dressing: Pt pick socks and shoes she can manage herself Toileting: Ind with some difficulty reaching at times Bathing: Pt's daughter has been helping her get a good shower Tub shower transfers: Mod Ind with shower chair Equipment: Shower seat with back, Grab bars, and bed side commode  FUNCTIONAL OUTCOME MEASURES: Eval: Quick Dash: TBD ~ Educated determination by OTR > 60%  02/18/24: Quick Dash: 43.2%   UPPER EXTREMITY ROM:     Active ROM Right eval Left eval  Shoulder flexion [facility]  Elbow flexion [facility]  Elbow extension    Wrist flexion 60 40  Wrist extension 70 40  Wrist ulnar deviation  Limited  Wrist radial deviation  Limited  Wrist pronation  WFL  Wrist supination  Limited   (Blank rows = not tested)  Active ROM Right eval Left eval  Thumb MCP (0-60)  55  Thumb IP (0-80)  0  Index MCP (0-90)   55  Index PIP (0-100)   65  Index DIP (0-70)    37  Long MCP (0-90)   55   Long PIP (0-100)   70   Long DIP (0-70)   37   Ring MCP (0-90)   55   Ring PIP (0-100)   78   Ring DIP (0-70)    37  Little MCP (0-90)    55  Little PIP (0-100)    78  Little DIP (0-70)   37   (Blank rows = not  tested)   UPPER EXTREMITY MMT:     MMT Right eval Left eval  Shoulder flexion 4- 4-  Elbow flexion 4 4-  Elbow extension 4 4-  Wrist flexion 4 3-  Wrist extension 4 3-  Wrist pronation 4 4-  Wrist supination 4 2  (Blank rows = not tested)  HAND FUNCTION: Grip strength: Right: TBA lbs; Left: TBA lbs  12/31/23: Right 40.7, 66.3, 71.2 Average 59.4 lbs Left 14.5, 35.9, 41.8 Average 30.7 lbs  01/14/24 Right 63.2, 70.5, 68.5 Left 40.1, 51.5, 52.4 Average Right: 67.4 lbs Left 48 lbs  02/18/24 Right 65.0, 57.5, 67.6 Left 37.6, 45.8, 54.4 Average Right: 63.4 lbs Left: 45.9 lbs  03/04/24 Right 68.1, 74.0, 73.6 Left 54.4, 67.4, 69.4 Average Right: 71.9 lbs; Left 63.7 lbs  03/23/24: Right 80.4, 78.2 Left 72.5, 73.4 Average: Right 79.3 lbs; Left 73.0 lbs  COORDINATION: 12/18/2023: 9 Hole Peg test: Right: 24.29 sec; Left: 26.46 sec  03/23/24:   9 Hole Peg Test: Right 24.11 sec Left: 23.13 sec  SENSATION: Neuropathy pain and discomfort  EDEMA: Eval: L hand/digits swollen s/p removal of cast today and pins 02/18/24: still swells at base of thumb/webspace  COGNITION: Overall cognitive status: Within functional limits for tasks assessed  OBSERVATIONS: Pt ambulates with no AE and no obvious loss of balance but with abnormal gait pattern s/p B hip and R knee replacements. The pt appears well kept and was concerned about the dry flaking skin around her hand/digits with education to gentle exfoliate skin but not to apply lotion yet as pin sites are not yet healed s/p removal of pins today.   TODAY'S TREATMENT:      - Self Care education and training plus therapeutic activities completed for duration as noted below including: Therapist reviewed goals with patient and updated patient progression.  No additional functional limitations identified. Pt's grip strength improved bilaterally over past 2 weeks again  02/18/24 Average Right: 63.4 lbs Left: 45.9 lbs   03/04/24 Average Right: 71.9  lbs; Left 63.7 lbs 03/23/24: Average: Right 79.3 lbs; Left 73.0 lbs Coordination:  12/18/23  9 Hole Peg test: Right: 24.29 sec; Left: 26.46 sec 03/23/24:  9 Hole Peg Test: Right 24.11 sec Left: 23.13 sec OT reviewed joint protection principles as previously noted in pt instructions as needed to improve UE pain.  Pt verbalizes appropriate carryover of prior joint protection education by accurately reporting multiple adaptive equipment (AE) items she has independently obtained and implemented at home, ongoing activity modification, and reducing resisted hand motions, indicating improved awareness of symptom management and joint protection principles.  OT provided additional education focused on pacing, avoiding sustained or forceful gripping, maintaining neutral joint alignment, and modifying tasks to reduce joint stress. HEP was reviewed with emphasis on continued gentle UE ROM, strengthening, and functional use as tolerated to support carryover and prevent decline. Education was also provided regarding adaptive equipment options to support independence and reduce strain during ADLs and IADLs.  - Therapeutic exercises completed for duration as noted below including: Pt placed BUE in Fluidotherapy machine with supervised ROM x 10 min. Pt was educated to complete AROM during modality time to improve ROM and decrease pain/stiffness of extremities by use of the machine's massaging action and thermal properties with improvement from 5/10 discomfort to 1-2 upon completion of modality. Reviewed Pt issued tendon gliding exercises to help increase the circulation to the hand and wrist, reduce swelling and promote healthier soft tissue for increased AROM and not for strength. PATIENT EDUCATION: Education details: DC instructions/Joint protection Person educated: Patient Education method: Explanation, Demonstration, and Verbal cues Education comprehension: verbalized understanding, returned demonstration, and verbal  cues required  HOME EXERCISE PROGRAM: 11/28/23: Tendon Gliding Exercises 12/05/23: AROM/AAROM exercises 12/31/23: Coordination Activities 01/14/24: Putty Activities 01/24/2024: updated putty HEP Access Code: [email or web address] 03/04/24: Joint Protection handouts  GOALS: Goals reviewed with patient? Yes  SHORT TERM GOALS: Target date: 12/20/23  Pt will obtain protective, custom forearm based thumb spica splint/orthotic. Baseline: New to outpt OT Goal status: MET   2.  Pt will demo/state understanding of initial HEP to improve pain levels and prerequisite motion for functional use of left hand/thumb. Baseline: New to outpt OT. Tendon gliding ROM provided at eval. Goal status: MET     LONG TERM GOALS: Target date: 02/07/24   Patient will demonstrate at least 16% improvement with quick Dash score (reporting <27% disability or less) indicating improved functional use of affected extremity.  Baseline: New to outpt OT ~ >63% Goal status: MET and Revised/Continued 02/18/24: Quick Dash: 43.2%   2.  Pt will improve grip strength in left hand from unable to assess to at least 50  lbs for functional use at home and in IADLs. Baseline: New to outpt OT ~ unable to assess Goal status: MET and Revised 12/31/23 Left 14.5, 35.9, 41.8 Average 30.7 lbs 01/14/24 Average Right: 67.4 lbs Left 48 lbs 02/18/24 Average Right: 63.4 lbs Left: 45.9 lbs 03/04/24 Average Right: 71.9 lbs; Left 63.7 lbs   3.  Pt will improve A/ROM in left thumb from opposition to tip of index finger on left to at least opposition to PIP (or greater) of left small finger, to have functional motion for tasks like ADLs, reach, and grasp.  Baseline: New to outpt OT ~ No active IP flexion Goal status: MET   4.  Pt will improve left hand functional use from unable to assess to at least Mod I for basic ADLs (ie, brushing her hair, bathing, brushing her teeth and folding laundry) to assist in ability to carry out self-care and higher-level  homecare tasks with less difficulty. Baseline: New to outpt OT with family assistance with bra/spanx Goal status: MET   5.  Pt will improve coordination skills in left hand, as seen by better score on 9 hole peg testing to have increased functional ability to carry out fine motor tasks (fasteners, etc.) and more complex, coordinated IADLs (meal prep, sports, etc.).  Baseline: New to outpt OT - unable to complete due to poor IP flexion Goal status: MET  12/18/23 9 Hole Peg test: Right: 24.29 sec; Left: 26.46 sec 03/04/24: Utilizing more AE at home 03/24/23   6.  Pt will decrease pain at worst from 5/10 to 2/10 or less to have better sleep and occupational participation in daily roles. Baseline: New to outpt OT Goal status: MET 02/18/24: Worst pain still 6/10 03/23/24: Worst Pain still 5/10 but more related to neuropathy than 1-2 s/pt fluido therapy  ASSESSMENT:  CLINICAL IMPRESSION:  Patient received discharge instructions re: joint protection strategies and is demonstrating appropriate carryover through activity modification, use of adaptive equipment, and adherence to hand exercise recommendations. Patient demonstrated understanding through verbal teach-back and return demonstration, and was encouraged to continue HEP and joint protection strategies following discharge.  Patient is appropriate for discharge and no longer demonstrates medical necessity for continued skilled occupational therapy services.  PERFORMANCE DEFICITS: in functional skills including ADLs, IADLs, coordination, dexterity, proprioception, sensation, edema, tone, ROM, strength, pain, fascial restrictions, muscle spasms, flexibility, Fine motor control, Gross motor control, mobility, balance, body mechanics, endurance, decreased knowledge of precautions, decreased knowledge of use of DME, wound, skin integrity, and  UE functional use, cognitive skills including memory, problem solving, and safety awareness, and psychosocial skills  including coping strategies, environmental adaptation, and routines and behaviors.   IMPAIRMENTS: are limiting patient from ADLs, IADLs, rest and sleep, leisure, and social participation.   COMORBIDITIES: has co-morbidities such as OA that affects occupational performance. Patient will benefit from skilled OT to address above impairments and improve overall function.  PLAN:  OCCUPATIONAL THERAPY DISCHARGE SUMMARY  Visits from Start of Care: 13  Current functional level related to goals / functional outcomes: Pt has met all goals (2/2 short term and 6/6 long term goals) to satisfactory levels and is pleased with outcomes.   Remaining deficits: Pt has slight functional deficits and pain due to arthritis and neuropathy but not generally related to the injury for this OT POC.   Education / Equipment: Pt has all needed materials and education. Pt understands how to continue on with self-management. See tx notes for more details.   Patient agrees to discharge due to max benefits received from outpatient occupational therapy / hand therapy at this time.    Clarita LITTIE Pride, OT 03/23/2024, 3:48 PM   "

## 2024-03-30 ENCOUNTER — Ambulatory Visit

## 2024-03-30 DIAGNOSIS — M1610 Unilateral primary osteoarthritis, unspecified hip: Secondary | ICD-10-CM | POA: Insufficient documentation

## 2024-03-30 DIAGNOSIS — E538 Deficiency of other specified B group vitamins: Secondary | ICD-10-CM

## 2024-03-30 DIAGNOSIS — M47817 Spondylosis without myelopathy or radiculopathy, lumbosacral region: Secondary | ICD-10-CM | POA: Insufficient documentation

## 2024-03-30 MED ORDER — CYANOCOBALAMIN 1000 MCG/ML IJ SOLN
1000.0000 ug | Freq: Once | INTRAMUSCULAR | Status: AC
  Administered 2024-03-30: 1000 ug via INTRAMUSCULAR

## 2024-03-30 NOTE — Progress Notes (Signed)
Pt was given B12 injection w/o any complications. 

## 2024-03-31 ENCOUNTER — Ambulatory Visit: Admitting: Occupational Therapy

## 2024-03-31 ENCOUNTER — Other Ambulatory Visit: Payer: Self-pay | Admitting: Internal Medicine

## 2024-04-07 ENCOUNTER — Other Ambulatory Visit: Payer: Self-pay | Admitting: Internal Medicine

## 2024-04-07 ENCOUNTER — Ambulatory Visit: Admitting: Occupational Therapy

## 2024-04-09 ENCOUNTER — Other Ambulatory Visit: Payer: Self-pay | Admitting: Internal Medicine

## 2024-04-20 ENCOUNTER — Encounter: Admitting: Internal Medicine

## 2024-04-30 ENCOUNTER — Encounter: Admitting: Internal Medicine

## 2024-08-31 ENCOUNTER — Ambulatory Visit
